# Patient Record
Sex: Male | Born: 1980
Health system: Southern US, Community
[De-identification: ages and names within clinical notes are randomized; demographics above are authoritative.]

## PROBLEM LIST (undated history)

## (undated) DIAGNOSIS — F32A Depression, unspecified: Secondary | ICD-10-CM

## (undated) DIAGNOSIS — M792 Neuralgia and neuritis, unspecified: Secondary | ICD-10-CM

## (undated) DIAGNOSIS — F319 Bipolar disorder, unspecified: Secondary | ICD-10-CM

## (undated) DIAGNOSIS — M1990 Unspecified osteoarthritis, unspecified site: Secondary | ICD-10-CM

## (undated) DIAGNOSIS — Z8719 Personal history of other diseases of the digestive system: Secondary | ICD-10-CM

## (undated) DIAGNOSIS — I10 Essential (primary) hypertension: Secondary | ICD-10-CM

## (undated) DIAGNOSIS — S8780XA Crushing injury of unspecified lower leg, initial encounter: Secondary | ICD-10-CM

## (undated) DIAGNOSIS — G43909 Migraine, unspecified, not intractable, without status migrainosus: Secondary | ICD-10-CM

## (undated) DIAGNOSIS — G9050 Complex regional pain syndrome I, unspecified: Secondary | ICD-10-CM

## (undated) DIAGNOSIS — J40 Bronchitis, not specified as acute or chronic: Secondary | ICD-10-CM

## (undated) DIAGNOSIS — K219 Gastro-esophageal reflux disease without esophagitis: Secondary | ICD-10-CM

## (undated) DIAGNOSIS — F419 Anxiety disorder, unspecified: Secondary | ICD-10-CM

## (undated) DIAGNOSIS — T7840XA Allergy, unspecified, initial encounter: Secondary | ICD-10-CM

## (undated) DIAGNOSIS — K259 Gastric ulcer, unspecified as acute or chronic, without hemorrhage or perforation: Secondary | ICD-10-CM

## (undated) DIAGNOSIS — F329 Major depressive disorder, single episode, unspecified: Secondary | ICD-10-CM

## (undated) HISTORY — DX: Allergy, unspecified, initial encounter: T78.40XA

## (undated) HISTORY — DX: Gastro-esophageal reflux disease without esophagitis: K21.9

## (undated) HISTORY — DX: Depression, unspecified: F32.A

## (undated) HISTORY — DX: Essential (primary) hypertension: I10

## (undated) HISTORY — DX: Gastric ulcer, unspecified as acute or chronic, without hemorrhage or perforation: K25.9

## (undated) HISTORY — DX: Migraine, unspecified, not intractable, without status migrainosus: G43.909

## (undated) HISTORY — DX: Anxiety disorder, unspecified: F41.9

## (undated) HISTORY — DX: Bipolar disorder, unspecified: F31.9

## (undated) HISTORY — DX: Bronchitis, not specified as acute or chronic: J40

## (undated) HISTORY — PX: HAND SURGERY: SHX662

## (undated) HISTORY — PX: MOUTH SURGERY: SHX715

## (undated) HISTORY — PX: COLONOSCOPY: SHX174

## (undated) HISTORY — DX: Major depressive disorder, single episode, unspecified: F32.9

---

## 2001-09-17 ENCOUNTER — Emergency Department (HOSPITAL_COMMUNITY): Admission: EM | Admit: 2001-09-17 | Discharge: 2001-09-17 | Payer: Self-pay

## 2002-02-06 ENCOUNTER — Emergency Department (HOSPITAL_COMMUNITY): Admission: EM | Admit: 2002-02-06 | Discharge: 2002-02-06 | Payer: Self-pay | Admitting: Emergency Medicine

## 2002-03-04 ENCOUNTER — Emergency Department (HOSPITAL_COMMUNITY): Admission: EM | Admit: 2002-03-04 | Discharge: 2002-03-05 | Payer: Self-pay | Admitting: Emergency Medicine

## 2002-03-30 ENCOUNTER — Emergency Department (HOSPITAL_COMMUNITY): Admission: EM | Admit: 2002-03-30 | Discharge: 2002-03-30 | Payer: Self-pay | Admitting: Emergency Medicine

## 2002-11-25 ENCOUNTER — Emergency Department (HOSPITAL_COMMUNITY): Admission: EM | Admit: 2002-11-25 | Discharge: 2002-11-25 | Payer: Self-pay

## 2003-02-07 ENCOUNTER — Encounter: Admission: RE | Admit: 2003-02-07 | Discharge: 2003-02-07 | Payer: Self-pay | Admitting: Family Medicine

## 2003-10-18 ENCOUNTER — Ambulatory Visit (HOSPITAL_COMMUNITY): Admission: RE | Admit: 2003-10-18 | Discharge: 2003-10-18 | Payer: Self-pay | Admitting: *Deleted

## 2003-10-18 ENCOUNTER — Encounter (INDEPENDENT_AMBULATORY_CARE_PROVIDER_SITE_OTHER): Payer: Self-pay | Admitting: *Deleted

## 2003-11-22 ENCOUNTER — Emergency Department (HOSPITAL_COMMUNITY): Admission: EM | Admit: 2003-11-22 | Discharge: 2003-11-23 | Payer: Self-pay | Admitting: Emergency Medicine
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
## 2004-08-12 ENCOUNTER — Emergency Department: Payer: Self-pay | Admitting: Emergency Medicine

## 2005-02-08 ENCOUNTER — Emergency Department: Payer: Self-pay | Admitting: Emergency Medicine

## 2005-02-19 ENCOUNTER — Emergency Department: Payer: Self-pay | Admitting: Emergency Medicine

## 2005-02-19 IMAGING — CR DG LUMBAR SPINE AP/LAT/OBLIQUES W/ FLEX AND EXT
1 series · 5 of 5 positions shown · non-contrast
Comparison: none

REASON FOR EXAM: Pain
COMMENTS:

PROCEDURE:     DXR - DXR LUMBAR SPINE WITH OBLIQUES  - [DATE]  [DATE]
RESULT:     Multiple views reveal normal alignment and curvature. Vertebral
body heights and intervertebral disc spaces are intact. No spondylolysis
and/or spondylolisthesis is identified.

[Series 1: view not recorded · 0.17mm/px · 5 of 5 slices shown]
[im 1/5]
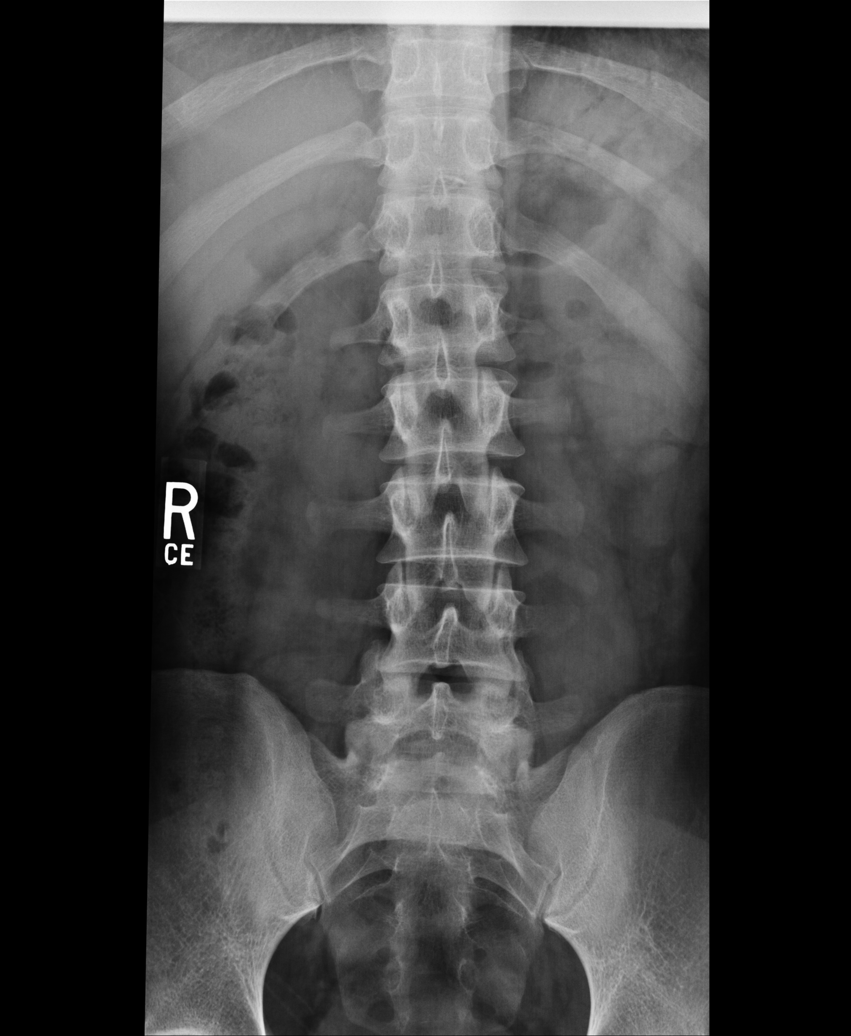
[im 2/5]
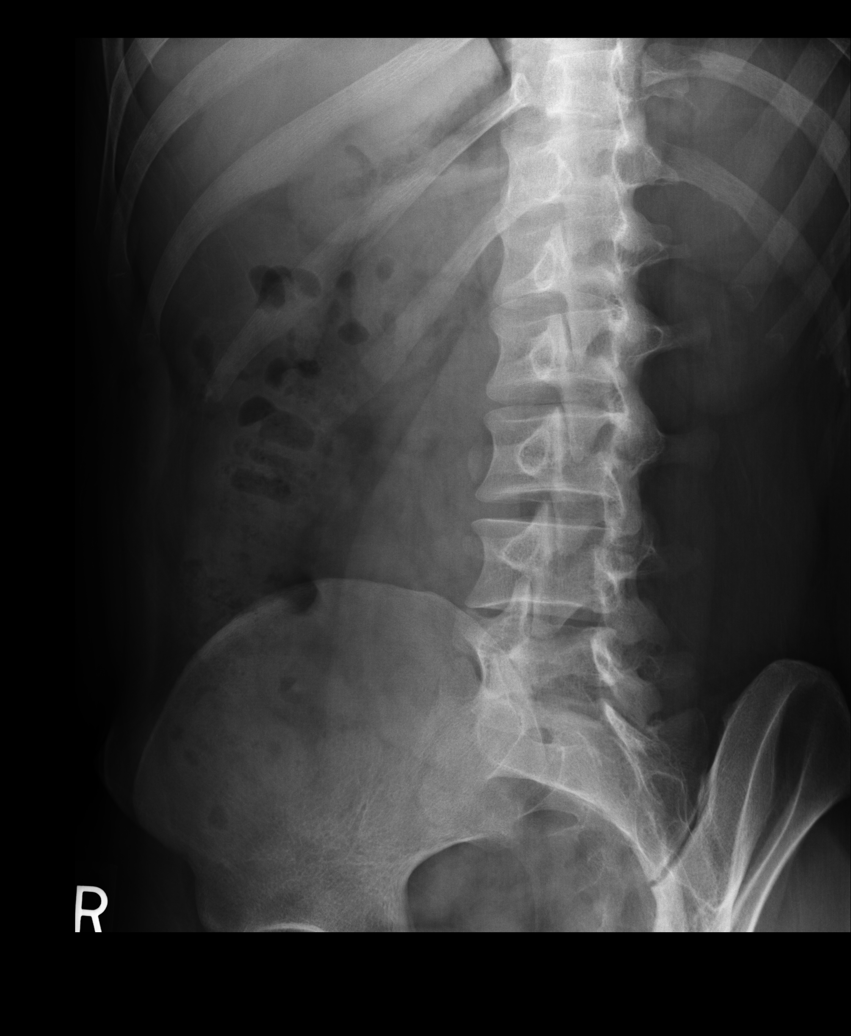
[im 3/5]
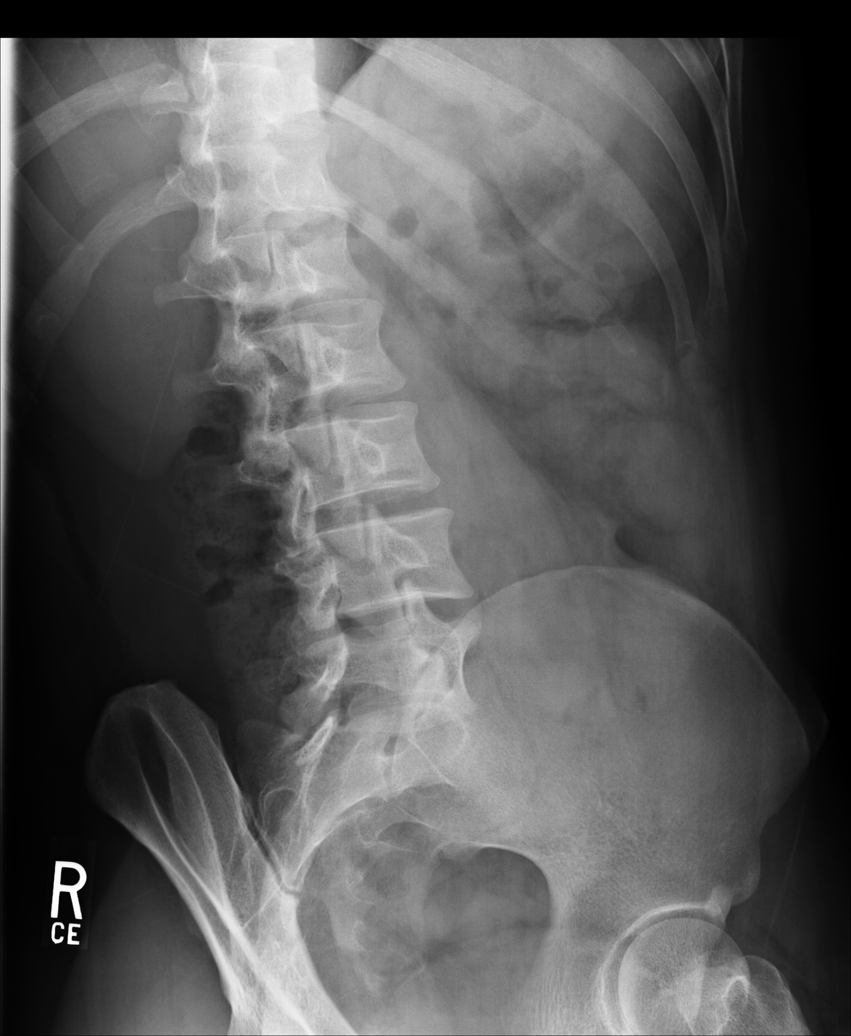
[im 4/5]
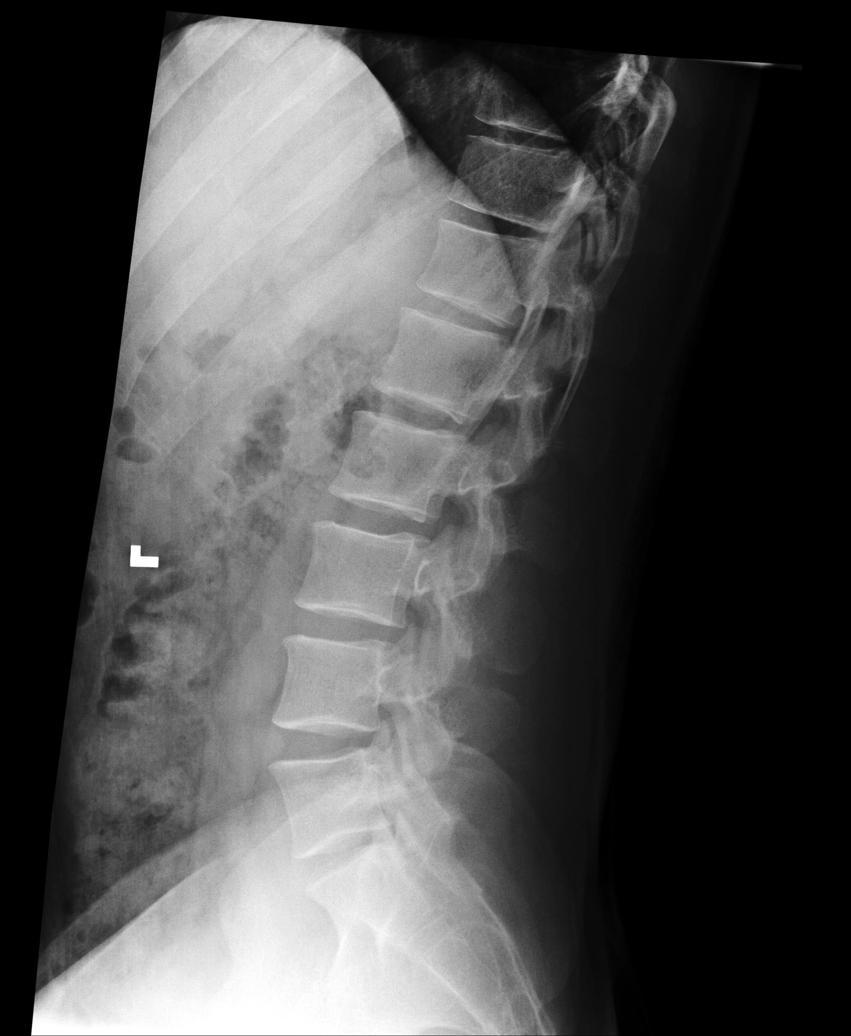
[im 5/5]
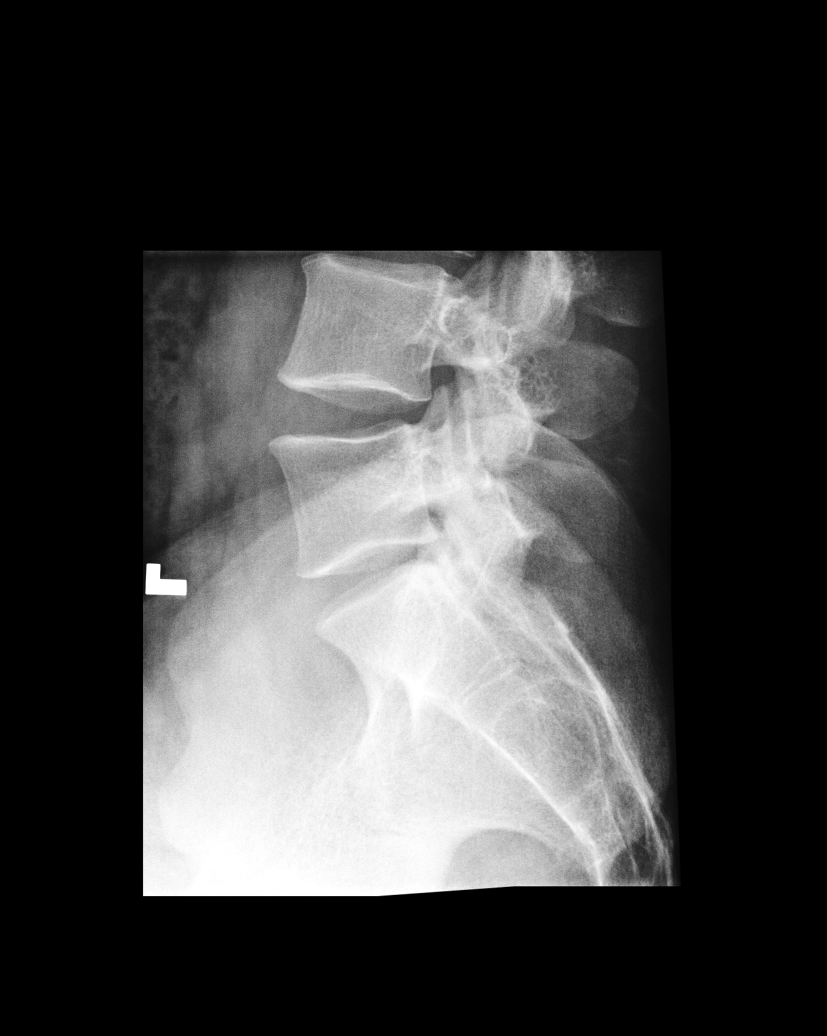

[5 of 5 positions shown; findings below may reference images not displayed]

IMPRESSION: No significant bony abnormality is noted of the lumbar
spine. If the patient has continued radiculopathy, lumbar spine MRI could be
performed to exclude a herniated disc.

## 2005-04-19 ENCOUNTER — Emergency Department (HOSPITAL_COMMUNITY): Admission: EM | Admit: 2005-04-19 | Discharge: 2005-04-19 | Payer: Self-pay | Admitting: Emergency Medicine

## 2005-04-19 IMAGING — CR DG FINGER LITTLE 2+V*R*
3 series · 3 of 3 positions shown · non-contrast
Comparison: None.

CLINICAL DATA: Dog bite.
 RIGHT FIFTH FINGER - 3 VIEW:

[view not recorded (1 of 3)]
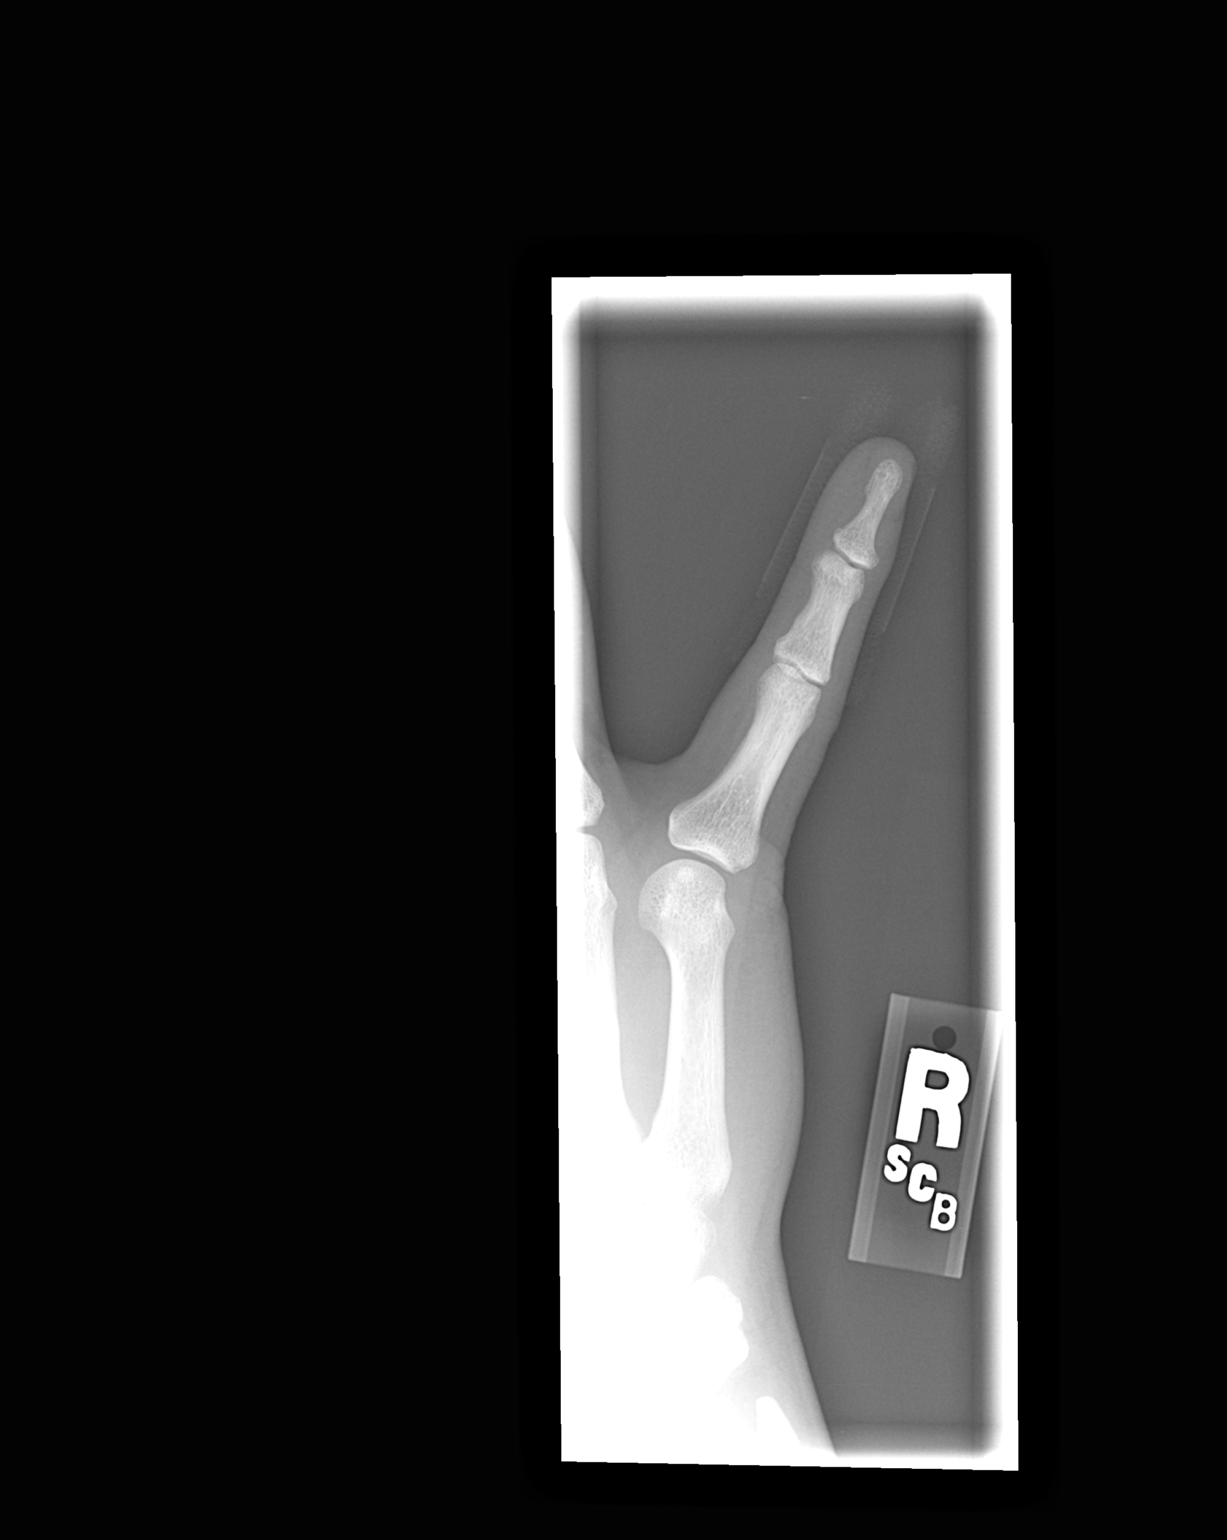

[view not recorded (2 of 3)]
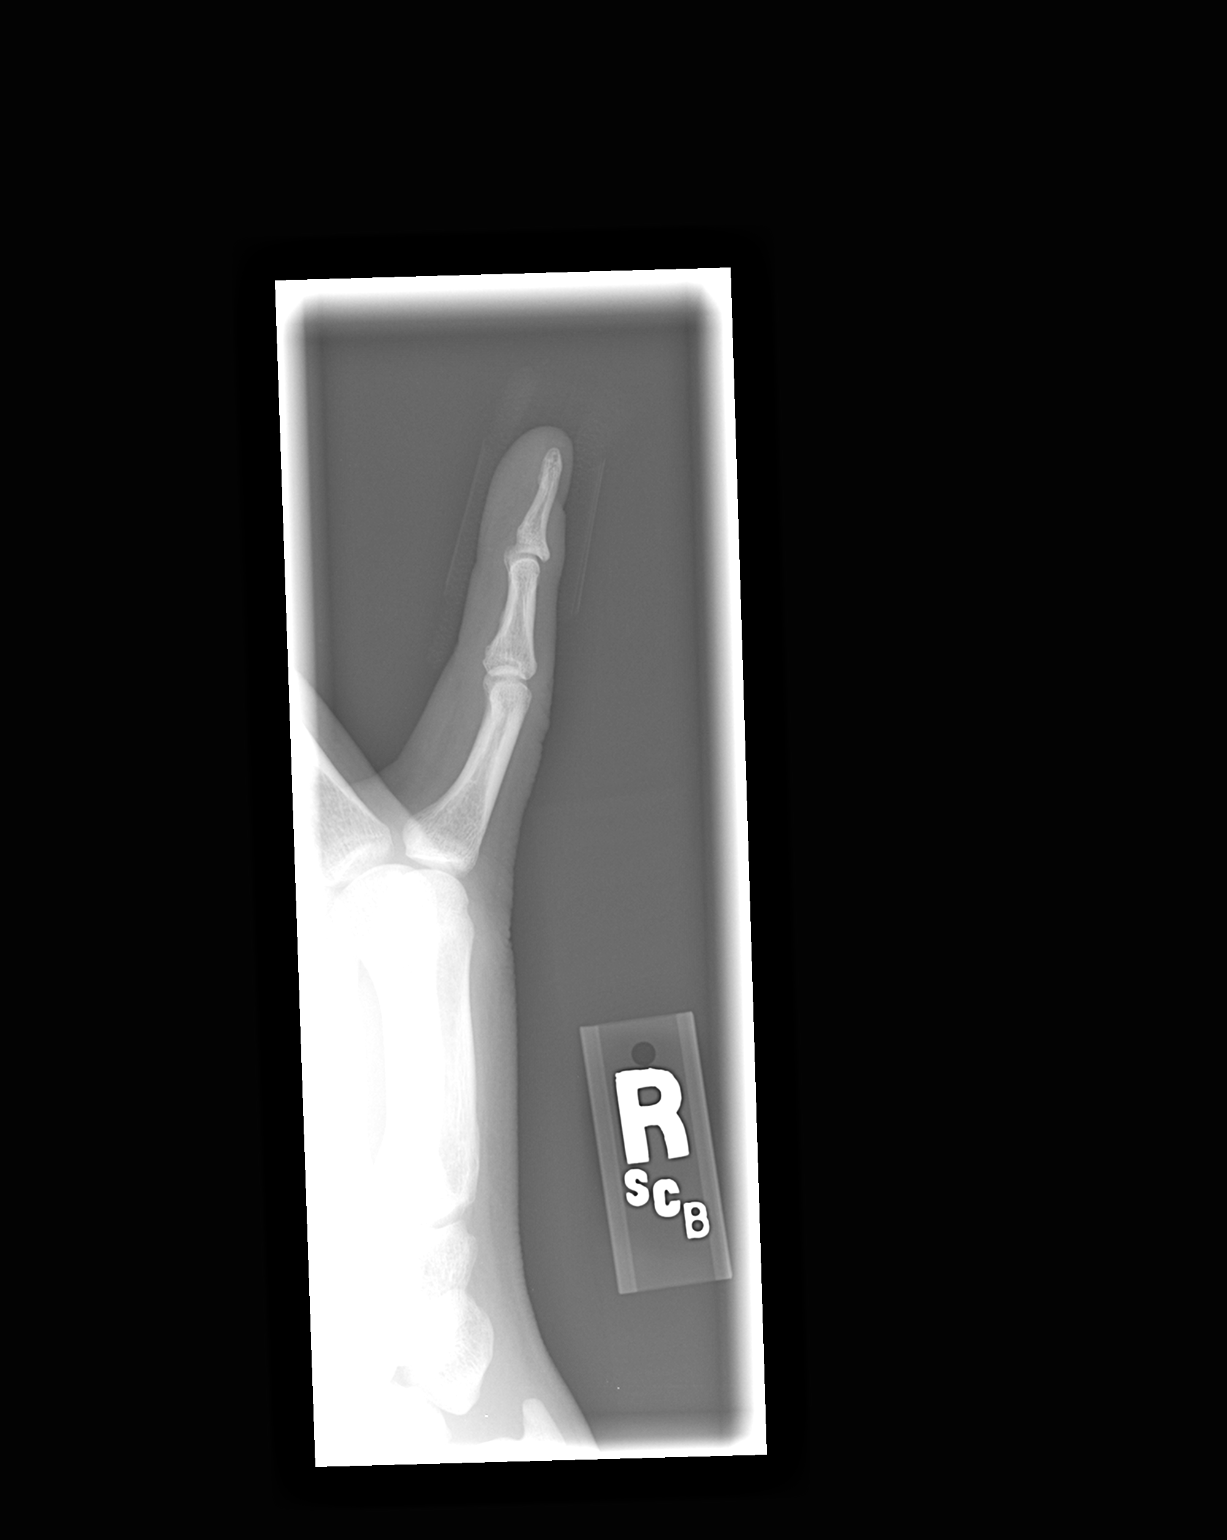

[view not recorded (3 of 3)]
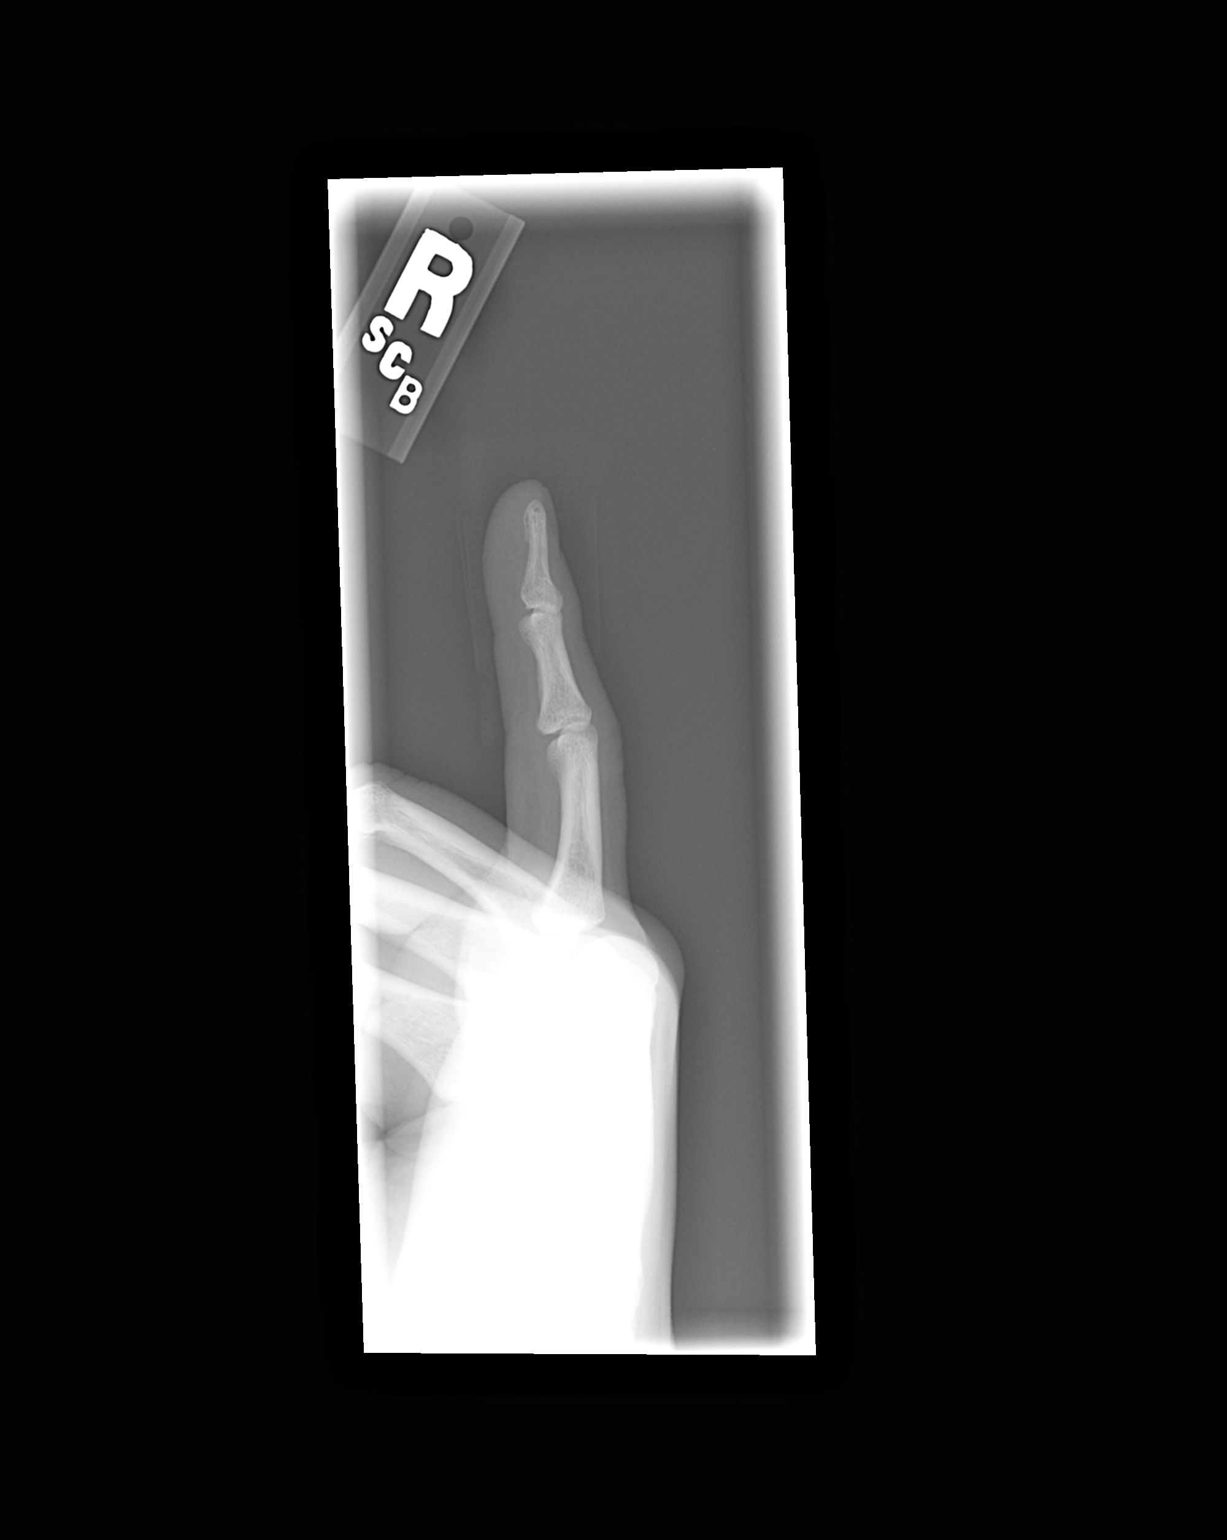

[3 of 3 positions shown; findings below may reference images not displayed]

FINDINGS: No acute osseous abnormalities are noted.  Specifically no fractures or dislocations.
IMPRESSION: No acute osseous finding.

## 2005-06-15 ENCOUNTER — Emergency Department: Payer: Self-pay | Admitting: Emergency Medicine

## 2005-08-02 ENCOUNTER — Emergency Department: Payer: Self-pay | Admitting: General Practice
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
## 2006-01-13 ENCOUNTER — Emergency Department: Payer: Self-pay | Admitting: Emergency Medicine
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
## 2006-02-27 ENCOUNTER — Ambulatory Visit: Payer: Self-pay | Admitting: Psychiatry

## 2006-02-27 ENCOUNTER — Inpatient Hospital Stay (HOSPITAL_COMMUNITY): Admission: AD | Admit: 2006-02-27 | Discharge: 2006-03-03 | Payer: Self-pay | Admitting: Psychiatry

## 2006-04-15 ENCOUNTER — Emergency Department (HOSPITAL_COMMUNITY): Admission: EM | Admit: 2006-04-15 | Discharge: 2006-04-16 | Payer: Self-pay | Admitting: Emergency Medicine

## 2006-04-16 ENCOUNTER — Emergency Department (HOSPITAL_COMMUNITY): Admission: EM | Admit: 2006-04-16 | Discharge: 2006-04-17 | Payer: Self-pay | Admitting: Emergency Medicine

## 2006-05-21 ENCOUNTER — Emergency Department: Payer: Self-pay | Admitting: Emergency Medicine
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
## 2006-11-12 ENCOUNTER — Emergency Department (HOSPITAL_COMMUNITY): Admission: EM | Admit: 2006-11-12 | Discharge: 2006-11-12 | Payer: Self-pay | Admitting: Emergency Medicine
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
## 2006-12-08 ENCOUNTER — Emergency Department (HOSPITAL_COMMUNITY): Admission: EM | Admit: 2006-12-08 | Discharge: 2006-12-08 | Payer: Self-pay | Admitting: Emergency Medicine

## 2007-05-12 ENCOUNTER — Emergency Department (HOSPITAL_COMMUNITY): Admission: EM | Admit: 2007-05-12 | Discharge: 2007-05-12 | Payer: Self-pay | Admitting: Emergency Medicine

## 2007-06-04 ENCOUNTER — Emergency Department (HOSPITAL_COMMUNITY): Admission: EM | Admit: 2007-06-04 | Discharge: 2007-06-04 | Payer: Self-pay | Admitting: Emergency Medicine

## 2007-07-05 ENCOUNTER — Emergency Department (HOSPITAL_COMMUNITY): Admission: EM | Admit: 2007-07-05 | Discharge: 2007-07-05 | Payer: Self-pay | Admitting: Emergency Medicine

## 2008-01-29 ENCOUNTER — Emergency Department (HOSPITAL_COMMUNITY): Admission: EM | Admit: 2008-01-29 | Discharge: 2008-01-29 | Payer: Self-pay | Admitting: Emergency Medicine
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
## 2008-08-22 ENCOUNTER — Emergency Department (HOSPITAL_COMMUNITY): Admission: EM | Admit: 2008-08-22 | Discharge: 2008-08-23 | Payer: Self-pay | Admitting: Emergency Medicine

## 2008-08-31 ENCOUNTER — Emergency Department (HOSPITAL_COMMUNITY): Admission: EM | Admit: 2008-08-31 | Discharge: 2008-08-31 | Payer: Self-pay | Admitting: Emergency Medicine

## 2008-12-03 ENCOUNTER — Emergency Department (HOSPITAL_COMMUNITY): Admission: EM | Admit: 2008-12-03 | Discharge: 2008-12-03 | Payer: Self-pay | Admitting: Emergency Medicine
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
## 2009-05-10 ENCOUNTER — Emergency Department (HOSPITAL_COMMUNITY): Admission: EM | Admit: 2009-05-10 | Discharge: 2009-05-10 | Payer: Self-pay | Admitting: Family Medicine

## 2010-01-23 ENCOUNTER — Emergency Department (HOSPITAL_COMMUNITY): Admission: EM | Admit: 2010-01-23 | Discharge: 2010-01-23 | Payer: Self-pay | Admitting: Family Medicine

## 2010-06-16 ENCOUNTER — Inpatient Hospital Stay (INDEPENDENT_AMBULATORY_CARE_PROVIDER_SITE_OTHER)
Admission: RE | Admit: 2010-06-16 | Discharge: 2010-06-16 | Disposition: A | Discharge status: Home / Self Care | Payer: BC Managed Care – PPO | Source: Ambulatory Visit | Attending: Family Medicine | Admitting: Family Medicine

## 2010-06-16 ENCOUNTER — Emergency Department (HOSPITAL_COMMUNITY): Payer: BC Managed Care – PPO

## 2010-06-16 ENCOUNTER — Emergency Department (HOSPITAL_COMMUNITY)
Admission: EM | Admit: 2010-06-16 | Discharge: 2010-06-17 | Disposition: A | Discharge status: Home / Self Care | Payer: BC Managed Care – PPO | Attending: Emergency Medicine | Admitting: Emergency Medicine

## 2010-06-16 DIAGNOSIS — R109 Unspecified abdominal pain: Secondary | ICD-10-CM | POA: Insufficient documentation

## 2010-06-16 DIAGNOSIS — R110 Nausea: Secondary | ICD-10-CM | POA: Insufficient documentation

## 2010-06-16 DIAGNOSIS — R1084 Generalized abdominal pain: Secondary | ICD-10-CM

## 2010-06-16 DIAGNOSIS — R51 Headache: Secondary | ICD-10-CM | POA: Insufficient documentation

## 2010-06-16 DIAGNOSIS — R10812 Left upper quadrant abdominal tenderness: Secondary | ICD-10-CM | POA: Insufficient documentation

## 2010-06-16 DIAGNOSIS — I10 Essential (primary) hypertension: Secondary | ICD-10-CM | POA: Insufficient documentation

## 2010-06-16 LAB — DIFFERENTIAL
Basophils Absolute: 0 10*3/uL (ref 0.0–0.1)
Basophils Relative: 0 % (ref 0–1)
Eosinophils Absolute: 0.2 10*3/uL (ref 0.0–0.7)
Eosinophils Relative: 3 % (ref 0–5)
Lymphocytes Relative: 23 % (ref 12–46)
Lymphs Abs: 1.6 10*3/uL (ref 0.7–4.0)
Monocytes Absolute: 0.4 10*3/uL (ref 0.1–1.0)
Monocytes Relative: 5 % (ref 3–12)
Neutro Abs: 4.8 10*3/uL (ref 1.7–7.7)
Neutrophils Relative %: 69 % (ref 43–77)

## 2010-06-16 LAB — POCT URINALYSIS DIP (DEVICE)
Bilirubin Urine: NEGATIVE
Glucose, UA: NEGATIVE mg/dL
Hgb urine dipstick: NEGATIVE
Ketones, ur: NEGATIVE mg/dL
Nitrite: POSITIVE — AB
Protein, ur: NEGATIVE mg/dL
Specific Gravity, Urine: 1.01 (ref 1.005–1.030)
Urobilinogen, UA: 0.2 mg/dL (ref 0.0–1.0)
pH: 7 (ref 5.0–8.0)

## 2010-06-16 LAB — COMPREHENSIVE METABOLIC PANEL
ALT: 25 U/L (ref 0–53)
AST: 23 U/L (ref 0–37)
Albumin: 4.7 g/dL (ref 3.5–5.2)
Alkaline Phosphatase: 66 U/L (ref 39–117)
BUN: 14 mg/dL (ref 6–23)
CO2: 29 mEq/L (ref 19–32)
Calcium: 9.7 mg/dL (ref 8.4–10.5)
Chloride: 102 mEq/L (ref 96–112)
Creatinine, Ser: 0.88 mg/dL (ref 0.4–1.5)
GFR calc Af Amer: >60 mL/min (ref >60)
GFR calc non Af Amer: >60 mL/min (ref >60)
Glucose, Bld: 98 mg/dL (ref 70–99)
Potassium: 4.2 mEq/L (ref 3.5–5.1)
Sodium: 143 mEq/L (ref 135–145)
Total Bilirubin: 0.4 mg/dL (ref 0.3–1.2)
Total Protein: 7.5 g/dL (ref 6.0–8.3)

## 2010-06-16 LAB — CBC
HCT: 46.5 % (ref 39.0–52.0)
Hemoglobin: 16.1 g/dL (ref 13.0–17.0)
MCH: 32.1 pg (ref 26.0–34.0)
MCHC: 34.6 g/dL (ref 30.0–36.0)
MCV: 92.6 fL (ref 78.0–100.0)
Platelets: 221 10*3/uL (ref 150–400)
RBC: 5.02 MIL/uL (ref 4.22–5.81)
RDW: 12.9 % (ref 11.5–15.5)
WBC: 6.9 10*3/uL (ref 4.0–10.5)

## 2010-06-16 LAB — URINALYSIS, ROUTINE W REFLEX MICROSCOPIC
Bilirubin Urine: NEGATIVE
Glucose, UA: NEGATIVE mg/dL
Hgb urine dipstick: NEGATIVE
Ketones, ur: NEGATIVE mg/dL
Nitrite: NEGATIVE
Protein, ur: NEGATIVE mg/dL
Specific Gravity, Urine: 1.004 — ABNORMAL LOW (ref 1.005–1.030)
Urobilinogen, UA: 0.2 mg/dL (ref 0.0–1.0)
pH: 7.5 (ref 5.0–8.0)

## 2010-06-16 LAB — LIPASE, BLOOD: Lipase: 26 U/L (ref 11–59)

## 2010-06-16 IMAGING — CT CT ABD-PELV W/ CM
2 of 4 series · 13 of 32 positions shown, 18 images · IV contrast (water/omni  & 100ml omni 300)
Comparison: None.

CLINICAL DATA: Right side abdominal pain.

CT ABDOMEN AND PELVIS WITH CONTRAST
TECHNIQUE: Multidetector CT imaging of the abdomen and pelvis was
performed following the standard protocol during bolus
administration of intravenous contrast.
Contrast: 100 ml [O0]

[Series 2: routine abdomen · axial · 0.85mm/px · z∈[-465,-135]mm · 5 of 100 slices shown, 10 images]
[im 17/100  soft-tissue]
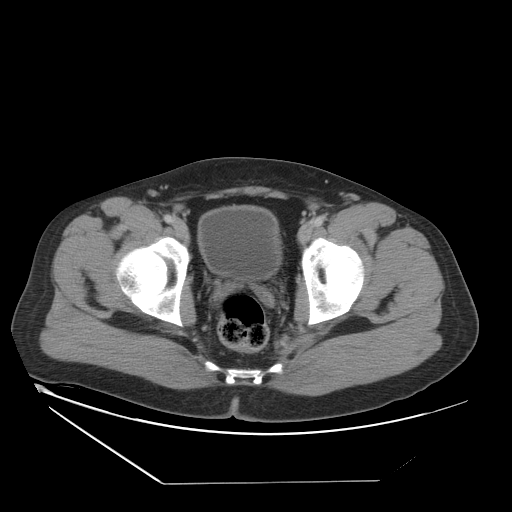
[im 17/100  bone]
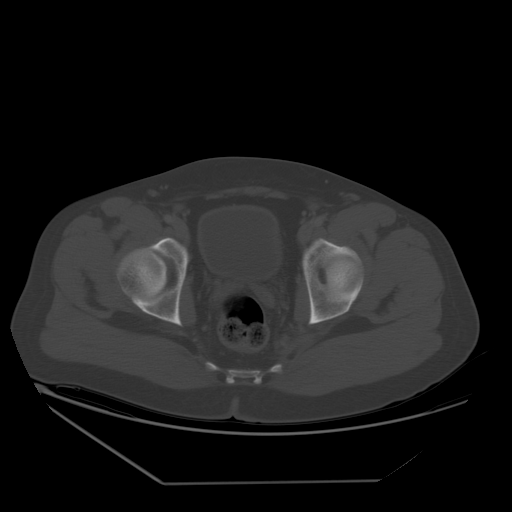
[im 34/100  soft-tissue]
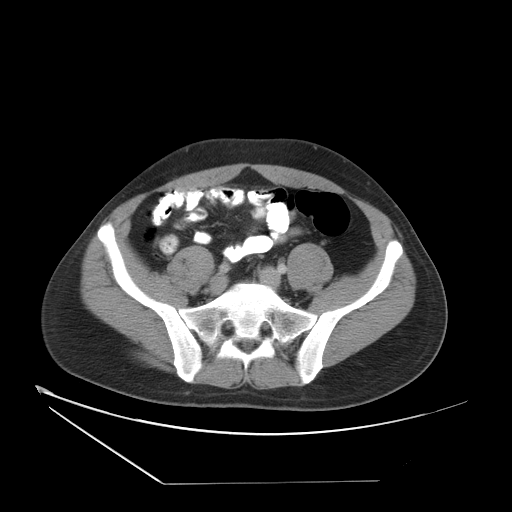
[im 34/100  lung]
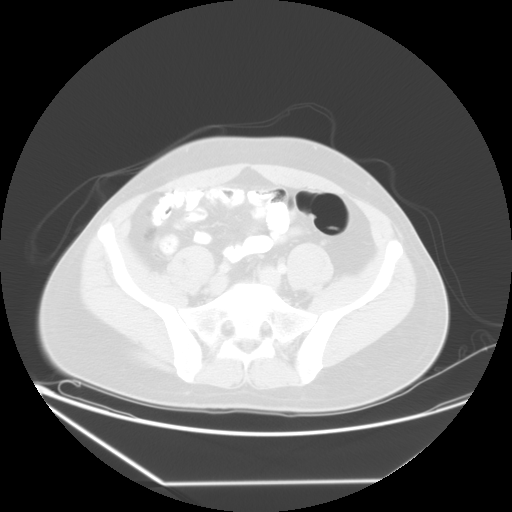
[im 50/100  soft-tissue]
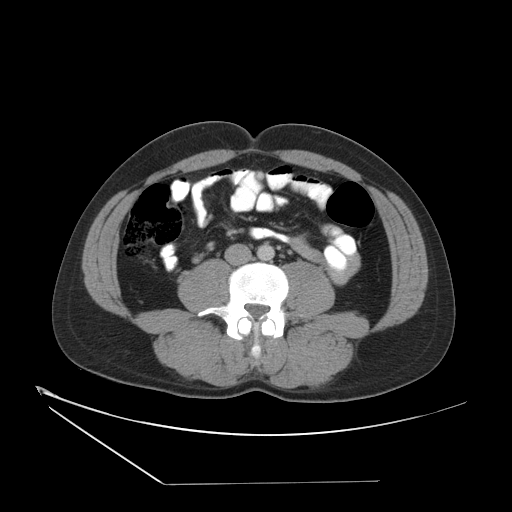
[im 50/100  lung]
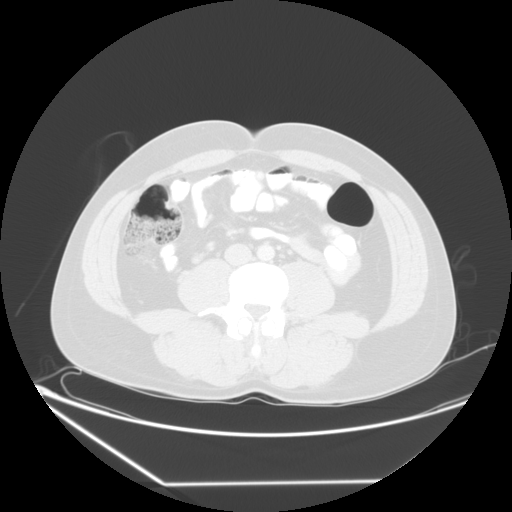
[im 67/100  soft-tissue]
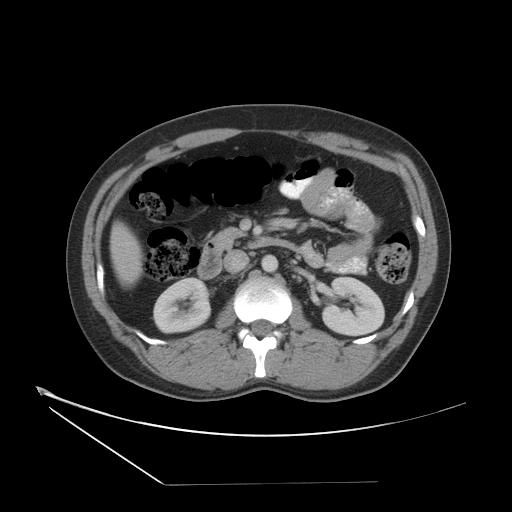
[im 67/100  lung]
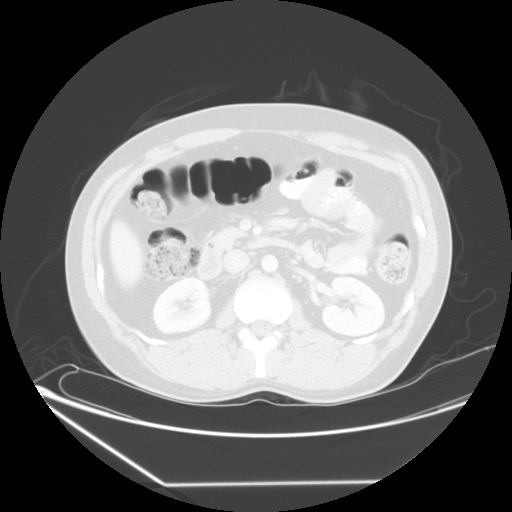
[im 83/100  soft-tissue]
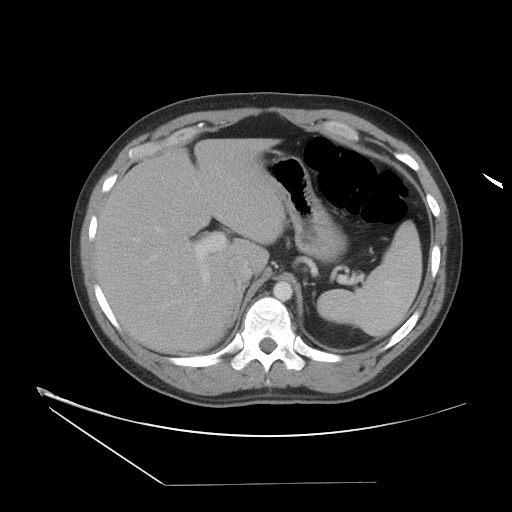
[im 83/100  lung]
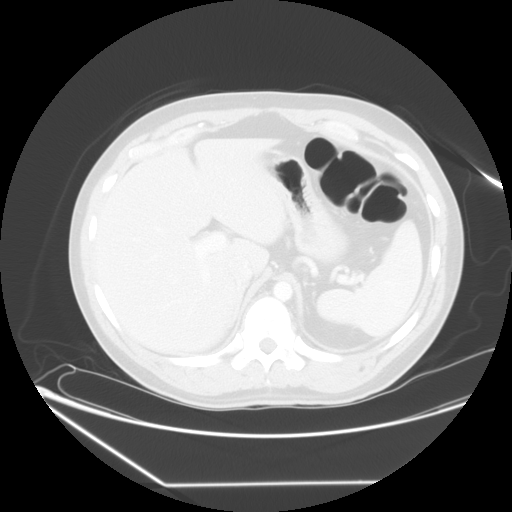

[Series 401: reformatted · sagittal · 0.99mm/px · 8 of 182 slices shown]
[im 16/182  soft-tissue]
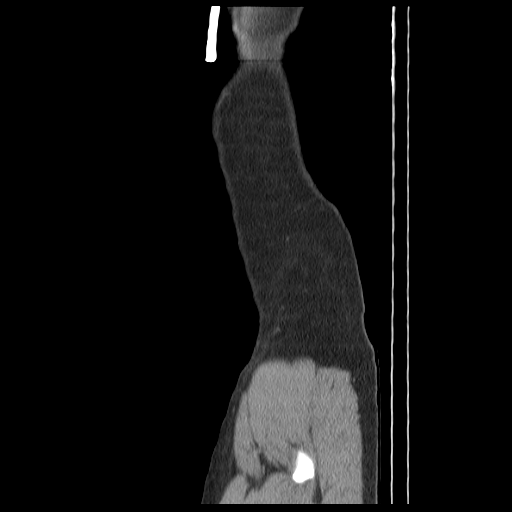
[im 46/182  soft-tissue]
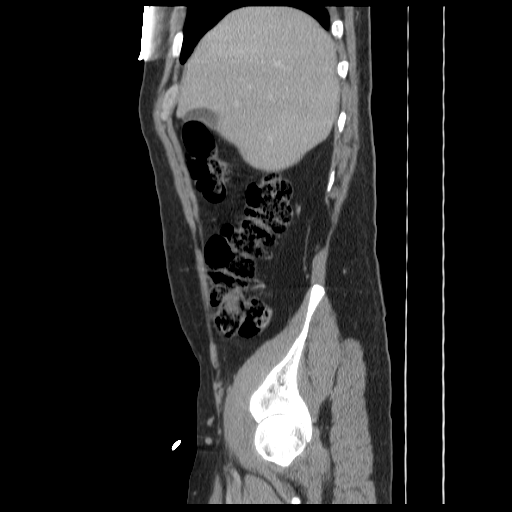
[im 61/182  soft-tissue]
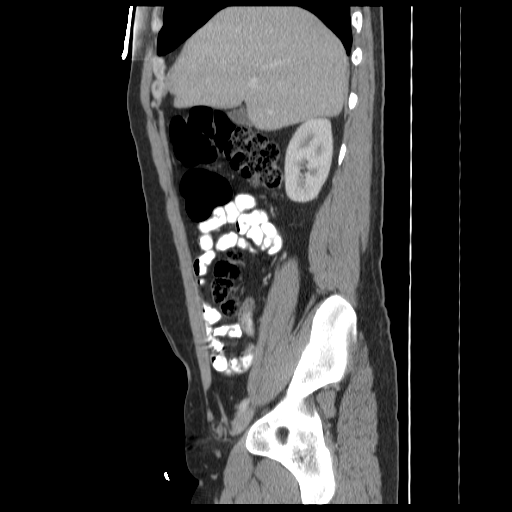
[im 76/182  soft-tissue]
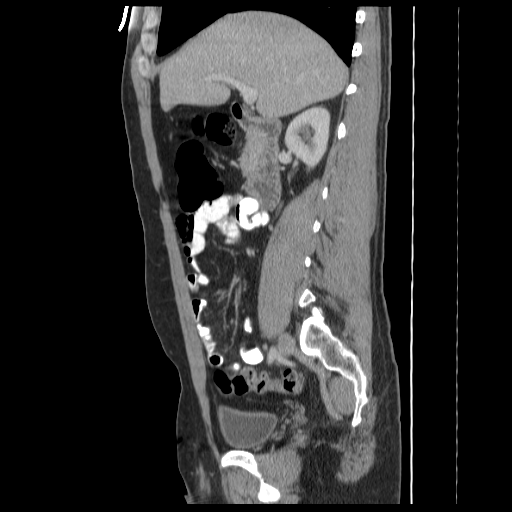
[im 106/182  soft-tissue]
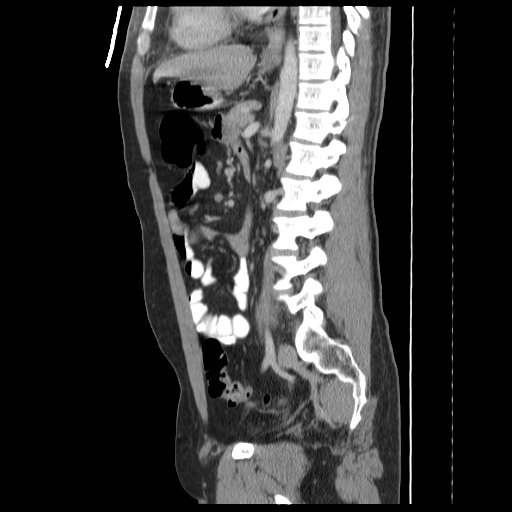
[im 121/182  soft-tissue]
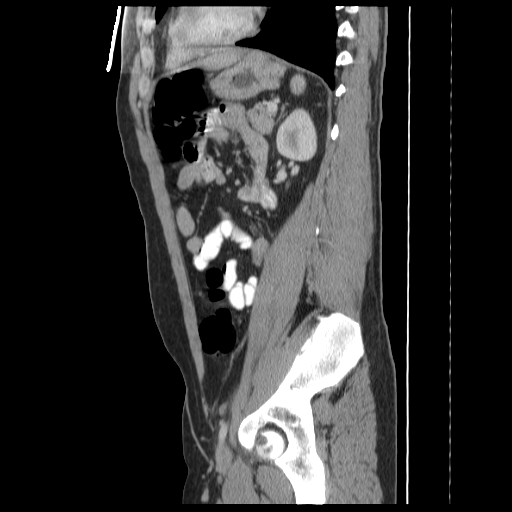
[im 136/182  soft-tissue]
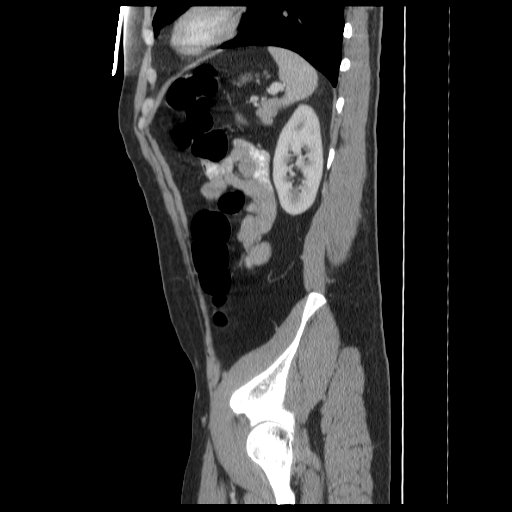
[im 166/182  soft-tissue]
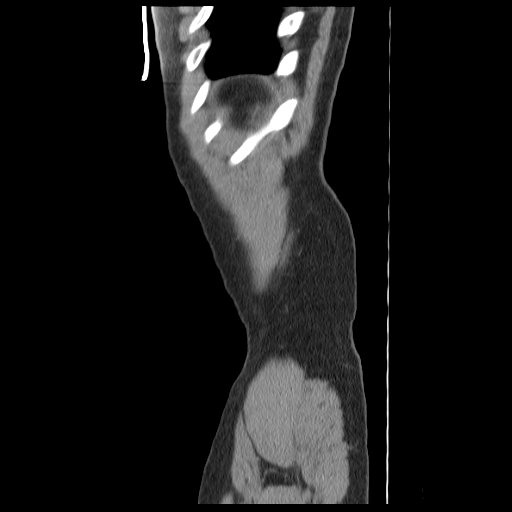

[13 of 32 positions shown; findings below may reference images not displayed]

FINDINGS: No focal abnormalities seen in the liver or spleen.  The
stomach, duodenum, pancreas, gallbladder, adrenal glands, and
kidneys have normal imaging features.  Retroaortic left renal vein
noted.

No abdominal aortic aneurysm.  No free fluid in the abdomen.  No
abdominal lymphadenopathy.

Imaging through the pelvis shows no free intraperitoneal fluid.  No
pelvic sidewall lymphadenopathy.  Bladder and prostate are normal
in appearance.  Scattered diverticular change seen in the sigmoid
colon without diverticulitis.  The terminal ileum is normal.  The
appendix is normal.

Bone windows reveal no worrisome lytic or sclerotic osseous lesions
IMPRESSION: No acute findings in the abdomen or pelvis.  Specifically, no
evidence to explain the patient's history of lower right abdominal
pain.

## 2010-06-16 MED ORDER — IOHEXOL 300 MG/ML  SOLN
100.0000 mL | Freq: Once | INTRAMUSCULAR | Status: AC | PRN
  Administered 2010-06-16: 100 mL via INTRAVENOUS

## 2010-06-18 ENCOUNTER — Emergency Department (HOSPITAL_COMMUNITY)
Admission: EM | Admit: 2010-06-18 | Discharge: 2010-06-18 | Disposition: A | Discharge status: Home / Self Care | Payer: BC Managed Care – PPO | Attending: Emergency Medicine | Admitting: Emergency Medicine

## 2010-06-18 DIAGNOSIS — K5900 Constipation, unspecified: Secondary | ICD-10-CM | POA: Insufficient documentation

## 2010-06-18 DIAGNOSIS — G8929 Other chronic pain: Secondary | ICD-10-CM | POA: Insufficient documentation

## 2010-06-18 DIAGNOSIS — R1033 Periumbilical pain: Secondary | ICD-10-CM | POA: Insufficient documentation

## 2010-06-18 DIAGNOSIS — R1915 Other abnormal bowel sounds: Secondary | ICD-10-CM | POA: Insufficient documentation

## 2010-06-18 DIAGNOSIS — I10 Essential (primary) hypertension: Secondary | ICD-10-CM | POA: Insufficient documentation

## 2010-06-18 LAB — URINALYSIS, ROUTINE W REFLEX MICROSCOPIC
Bilirubin Urine: NEGATIVE
Glucose, UA: NEGATIVE mg/dL
Hgb urine dipstick: NEGATIVE
Ketones, ur: NEGATIVE mg/dL
Nitrite: NEGATIVE
Protein, ur: NEGATIVE mg/dL
Specific Gravity, Urine: 1.01 (ref 1.005–1.030)
Urobilinogen, UA: 0.2 mg/dL (ref 0.0–1.0)
pH: 6 (ref 5.0–8.0)

## 2010-06-18 LAB — DIFFERENTIAL
Basophils Absolute: 0 10*3/uL (ref 0.0–0.1)
Basophils Relative: 0 % (ref 0–1)
Eosinophils Absolute: 0.2 10*3/uL (ref 0.0–0.7)
Eosinophils Relative: 2 % (ref 0–5)
Lymphocytes Relative: 29 % (ref 12–46)
Lymphs Abs: 2 10*3/uL (ref 0.7–4.0)
Monocytes Absolute: 0.4 10*3/uL (ref 0.1–1.0)
Monocytes Relative: 6 % (ref 3–12)
Neutro Abs: 4.3 10*3/uL (ref 1.7–7.7)
Neutrophils Relative %: 63 % (ref 43–77)

## 2010-06-18 LAB — BASIC METABOLIC PANEL
BUN: 12 mg/dL (ref 6–23)
CO2: 30 mEq/L (ref 19–32)
Calcium: 9.6 mg/dL (ref 8.4–10.5)
Chloride: 99 mEq/L (ref 96–112)
Creatinine, Ser: 0.96 mg/dL (ref 0.4–1.5)
GFR calc Af Amer: >60 mL/min (ref >60)
GFR calc non Af Amer: >60 mL/min (ref >60)
Glucose, Bld: 102 mg/dL — ABNORMAL HIGH (ref 70–99)
Potassium: 3.5 mEq/L (ref 3.5–5.1)
Sodium: 139 mEq/L (ref 135–145)

## 2010-06-18 LAB — LIPASE, BLOOD: Lipase: 21 U/L (ref 11–59)

## 2010-06-18 LAB — CBC
HCT: 46 % (ref 39.0–52.0)
Hemoglobin: 16.1 g/dL (ref 13.0–17.0)
MCH: 32.4 pg (ref 26.0–34.0)
MCHC: 35 g/dL (ref 30.0–36.0)
MCV: 92.6 fL (ref 78.0–100.0)
Platelets: 212 10*3/uL (ref 150–400)
RBC: 4.97 MIL/uL (ref 4.22–5.81)
RDW: 12.6 % (ref 11.5–15.5)
WBC: 6.9 10*3/uL (ref 4.0–10.5)

## 2010-08-09 NOTE — Discharge Summary (Signed)
NAME:  Shane Cole, Shane Cole NO.:  [id number]   MEDICAL RECORD NO.:  [id number]          PATIENT TYPE:  IPS   LOCATION:  0302                          FACILITY:  [facility]   PHYSICIAN:  Geoffery Lyons, M.D.      DATE OF BIRTH:  [birth year]   DATE OF ADMISSION:  02/27/2006  DATE OF DISCHARGE:  03/03/2006                               DISCHARGE SUMMARY   CHIEF COMPLAINT AND PRESENT ILLNESS:  This is the first admission to  [facility] for this 30 year old single white male  voluntarily admitted with history of depression and suicidal thoughts.  He planned to put a coil around his neck.  He has been under a great  deal of stress.  He broke up with his girlfriend and has abusing  opioids, using hydrocodone for the past 2 years, up to 3 or more daily.  Last use was December 6.  Reported alcohol use.  He said he had some  valium from a friend to help with his anxiety.   PAST PSYCHIATRIC HISTORY:  First time at [facility], sponsored by  [employer].  He was detoxed in a facility in June.  He had tried  multiple medications include Cymbalta and Seroquel.   ALCOHOL/DRUG HISTORY:  As already stated persistence use of opioids, not  alcohol.   MEDICAL HISTORY:  Recurrent pain in left foot from a crush injury with  RSD.   MEDICATIONS:  Recent Valium and Ativan.   PHYSICAL EXAMINATION:  Performed and failed to show any acute findings.   LABORATORY DATA:  CBC within normal limits.  BMET within normal limits.  UDS positive for benzos and opioids.   MENTAL STATUS EXAM:  This is a fully alert, cooperative male, good eye  contact, casually dressed.  Speech was clear.  Normal rate, tempo and  production.  Seemed depressed and anxious. Having some withdrawal  symptoms.  Pleasant. Gets teary-eyed at times, talking about his  suicidal plan. Thought processes are logical, coherent and relevant.  No  evidence of delusions. No active suicidal or homicidal ideas, no  hallucinations.  Cognition well preserved.   Axis I:  1. Major depressive disorder, rule out substance abuse mood disorder.  2. Opioid dependent, admits to benzodiazepine abuse.  Axis II:  No diagnosis.  Axis III:  RSD left foot.  Axis IV: Moderate.  Axis V:  Upon admission 35, highest GAF in the last year 60.   HOSPITAL COURSE:  He was admitted.  He was started individual and group  psychotherapy with detox protocol with clonidine.  Placed on Cymbalta 30  mg per day.  Given some Librium and he was given Seroquel as needed.  He  endorsed that he got dependent on opioids, claimed he broke his foot at  work when a piece of granite fell on it.  He ran out of insurance, could  not afford to see a physician.  Started buying the opioids in the  street.  Got more dependent and tolerant.  More recently his fiance  broke up with him due to lies and his use.  She felt  that he was  cheating on her, although he denied.  Wanted to abstain as well followup  in the Pain Clinic.  He had put a cord around his neck to finish his  life.  She apparently read his inappropriate text message.  Had been on  amitriptyline in the past, poor response.  Tried Cymbalta, but stopped  after he got better.  He has been feeling overwhelmed and stressed out.  By December 8, he was having some tremor shakes, withdrawn and guarded  and somewhat tearful.  Anniversary of the relationship with the  girlfriend.  By December 9 there was some improvement.  Continued to  have a hard time thinking about the girlfriend.  He started improving.  He was open in group and in individual sessions.  His mood improved and  his affect became brighter.  By December 11, he was fully detoxed,  committed to abstinence.  Was endorsing no suicidal or homicidal  ideation, no delusions.  Was going to pursue  medications and continued  to work with the counselor.   DISCHARGE DIAGNOSES:  Axis I: Major depressive disorder, opioid  dependence,  benzodiazepine abuse.  Axis II:    No diagnosis.  Axis III:   RSD left foot.  Axis IV:    Moderate.  Axis V:     Upon discharge 55-60.   DISCHARGE MEDICATIONS:  1. Cymbalta 30 mg a day.  2. Ambien CI 12.5 mg per day.   FOLLOWUP:  Pain Clinic.      Geoffery Lyons, M.D.  Electronically Signed     IL/MEDQ  D:  03/25/2006  T:  03/26/2006  Job:  [id number]

## 2010-08-09 NOTE — H&P (Signed)
NAME:  Shane Cole, Shane Cole NO.:  [id number]   MEDICAL RECORD NO.:  [id number]          PATIENT TYPE:  IPS   LOCATION:  0302                          FACILITY:  [facility]   PHYSICIAN:  Geoffery Lyons, M.D.      DATE OF BIRTH:  06-03-1980   DATE OF ADMISSION:  02/27/2006  DATE OF DISCHARGE:                       PSYCHIATRIC ADMISSION ASSESSMENT   IDENTIFYING INFORMATION:  The patient is a 30 year old single white male  voluntarily admitted on February 27, 2006.   HISTORY OF PRESENT ILLNESS:  The patient presents with a history of  depression and suicidal thoughts.  Had a plan to put a cord around his  neck.  The patient states he has been under a great deal of stress.  Broke up with his girlfriend and current medical problems.  He also has  been abusing opiates, using hydrocodone, for the past two years, taking  up to 20 or more daily with his last use on February 26, 2006.  He  reports rare alcohol use and recently got some Valium from a friend to  help with his anxiety.   PAST PSYCHIATRIC HISTORY:  First admission to [facility].  He is currently sponsored at this time.  He was detoxed in a facility in  June.  He states he was on 13 medications including Cymbalta and  Seroquel.   SOCIAL HISTORY:  The patient is a 30 year old single white male.  No  children.  Lives with his father.  He works Probation officer.  He has  a court date pending on March 21, 2006 for a DUI which he states is  his first.  States it was a mistake and his charge will be dropped.   FAMILY HISTORY:  Depression on his father's side.  Aunt also with  depression.   ALCOHOL/DRUG HISTORY:  The patient smokes.  Alcohol as described above.   PRIMARY CARE PHYSICIAN:  Dr. Barrie Dunker.   MEDICAL PROBLEMS:  Chronic pain from left foot from a crush injury 2-1/2  years ago with RSD.   MEDICATIONS:  Recent Valium and Ativan.   ALLERGIES:  __________ (states he gets ringing in his ear),  PENICILLIN  (from childhood), AMOXICILLIN (reports a rash) and allergic to CODEINE.   PHYSICAL EXAMINATION:  The patient was fully assessed at [facility]  Emergency Department.  He is a young male missing his four upper teeth.  Feeling very achy and cold.  Temperature is 98.5, heart rate 97,  respirations 16, blood pressure 141/89.  He is 184 pounds.  He appears  well-nourished.  Appears in no acute physical distress.   LABORATORY DATA:  His CBC is within normal limits.  His BMET is within  normal limits.  Urine drug screen positive for benzodiazepines, positive  for opiates.  Alcohol level less than 5.   MENTAL STATUS EXAM:  He is fully alert, cooperative.  Good eye contact.  Casually dressed.  Speech is clear, normal rate and tone.  The patient  feels depressed and anxious, having some withdrawal symptoms.  The  patient is pleasant, gets teary-eyed at times when talking about his  suicide  plan.  Thought processes are coherent.  No evidence of  psychosis.  Cognitive function is intact.  Memory is good.  Judgment is  fair.  Insight is partial.   DIAGNOSES:  AXIS I:  Major depressive disorder, single episode.  Rule  out substance-induced mood disorder.  Opiate dependence.  Benzodiazepine  abuse.  AXIS II:  Deferred.  AXIS III:  RSD left foot.  AXIS IV:  Problems with legal system, psychosocial problems, medical  problems, also access to health care services as patient is without  insurance for approximately three months.  AXIS V:  Current 35.   PLAN:  To stabilize mood and thinking.  Contract for safety.  Will detox  the patient with the clonidine protocol.  Will initiate Cymbalta and  reinforce medication compliance.  Will also have some Seroquel p.r.n.  for agitation.  The patient is to encourage fluids and attend groups.  [facility] is to look at any rehab programs available to patient.   TENTATIVE LENGTH OF STAY:  Three to four days.      Landry Corporal, N.P.       Geoffery Lyons, M.D.  Electronically Signed    JO/MEDQ  D:  02/27/2006  T:  02/28/2006  Job:  [id number]

## 2010-09-29 ENCOUNTER — Emergency Department (HOSPITAL_COMMUNITY)
Admission: EM | Admit: 2010-09-29 | Discharge: 2010-09-29 | Disposition: A | Discharge status: Home / Self Care | Payer: BC Managed Care – PPO | Attending: Emergency Medicine | Admitting: Emergency Medicine

## 2010-09-29 DIAGNOSIS — IMO0002 Reserved for concepts with insufficient information to code with codable children: Secondary | ICD-10-CM | POA: Insufficient documentation

## 2010-09-29 DIAGNOSIS — Z23 Encounter for immunization: Secondary | ICD-10-CM | POA: Insufficient documentation

## 2010-09-29 DIAGNOSIS — S91009A Unspecified open wound, unspecified ankle, initial encounter: Secondary | ICD-10-CM | POA: Insufficient documentation

## 2010-09-29 DIAGNOSIS — S81009A Unspecified open wound, unspecified knee, initial encounter: Secondary | ICD-10-CM | POA: Insufficient documentation

## 2010-09-29 DIAGNOSIS — R000 Tachycardia, unspecified: Secondary | ICD-10-CM | POA: Insufficient documentation

## 2010-09-29 DIAGNOSIS — W298XXA Contact with other powered powered hand tools and household machinery, initial encounter: Secondary | ICD-10-CM | POA: Insufficient documentation

## 2010-09-29 DIAGNOSIS — I10 Essential (primary) hypertension: Secondary | ICD-10-CM | POA: Insufficient documentation

## 2010-11-19 ENCOUNTER — Emergency Department (HOSPITAL_COMMUNITY): Payer: Commercial Managed Care - PPO

## 2010-11-19 ENCOUNTER — Emergency Department (HOSPITAL_COMMUNITY)
Admission: EM | Admit: 2010-11-19 | Discharge: 2010-11-19 | Disposition: A | Discharge status: Home / Self Care | Payer: Commercial Managed Care - PPO | Attending: Emergency Medicine | Admitting: Emergency Medicine

## 2010-11-19 DIAGNOSIS — I10 Essential (primary) hypertension: Secondary | ICD-10-CM | POA: Insufficient documentation

## 2010-11-19 DIAGNOSIS — G8929 Other chronic pain: Secondary | ICD-10-CM | POA: Insufficient documentation

## 2010-11-19 DIAGNOSIS — W208XXA Other cause of strike by thrown, projected or falling object, initial encounter: Secondary | ICD-10-CM | POA: Insufficient documentation

## 2010-11-19 DIAGNOSIS — G589 Mononeuropathy, unspecified: Secondary | ICD-10-CM | POA: Insufficient documentation

## 2010-11-19 DIAGNOSIS — M79609 Pain in unspecified limb: Secondary | ICD-10-CM | POA: Insufficient documentation

## 2010-11-19 IMAGING — CR DG FOOT COMPLETE 3+V*L*
3 series · 3 of 3 positions shown · non-contrast
Comparison: None

CLINICAL DATA: Injured foot.

LEFT FOOT - COMPLETE 3+ VIEW

[t foot ap left]
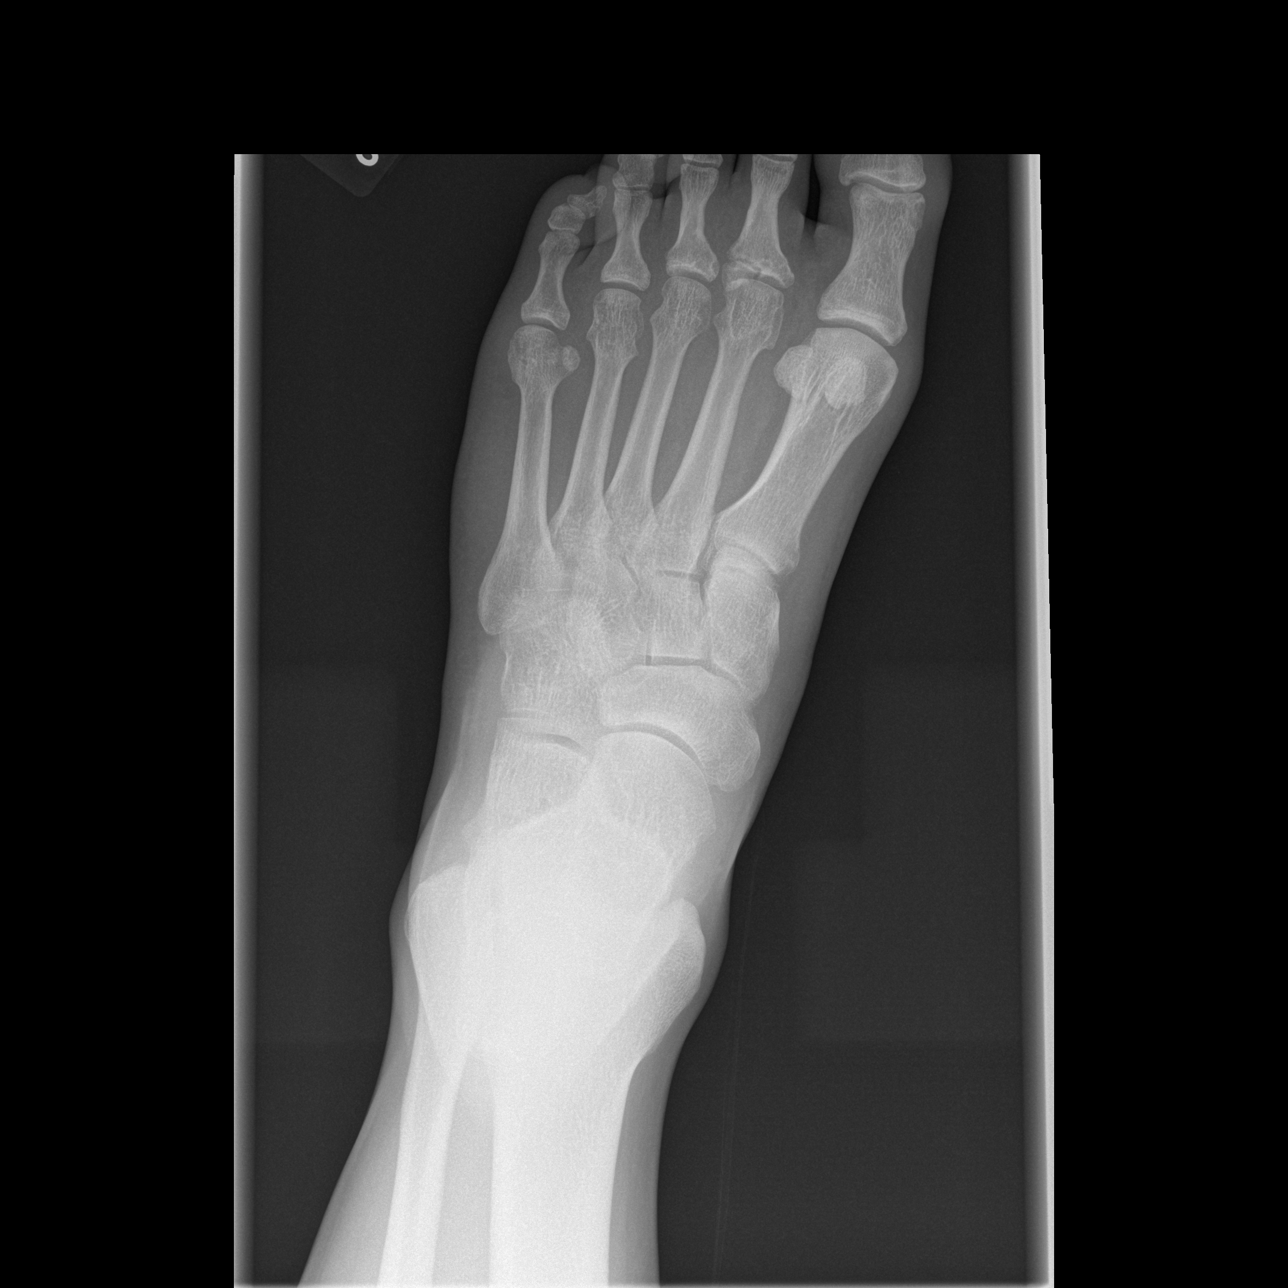

[t foot oblique left (1 of 2)]
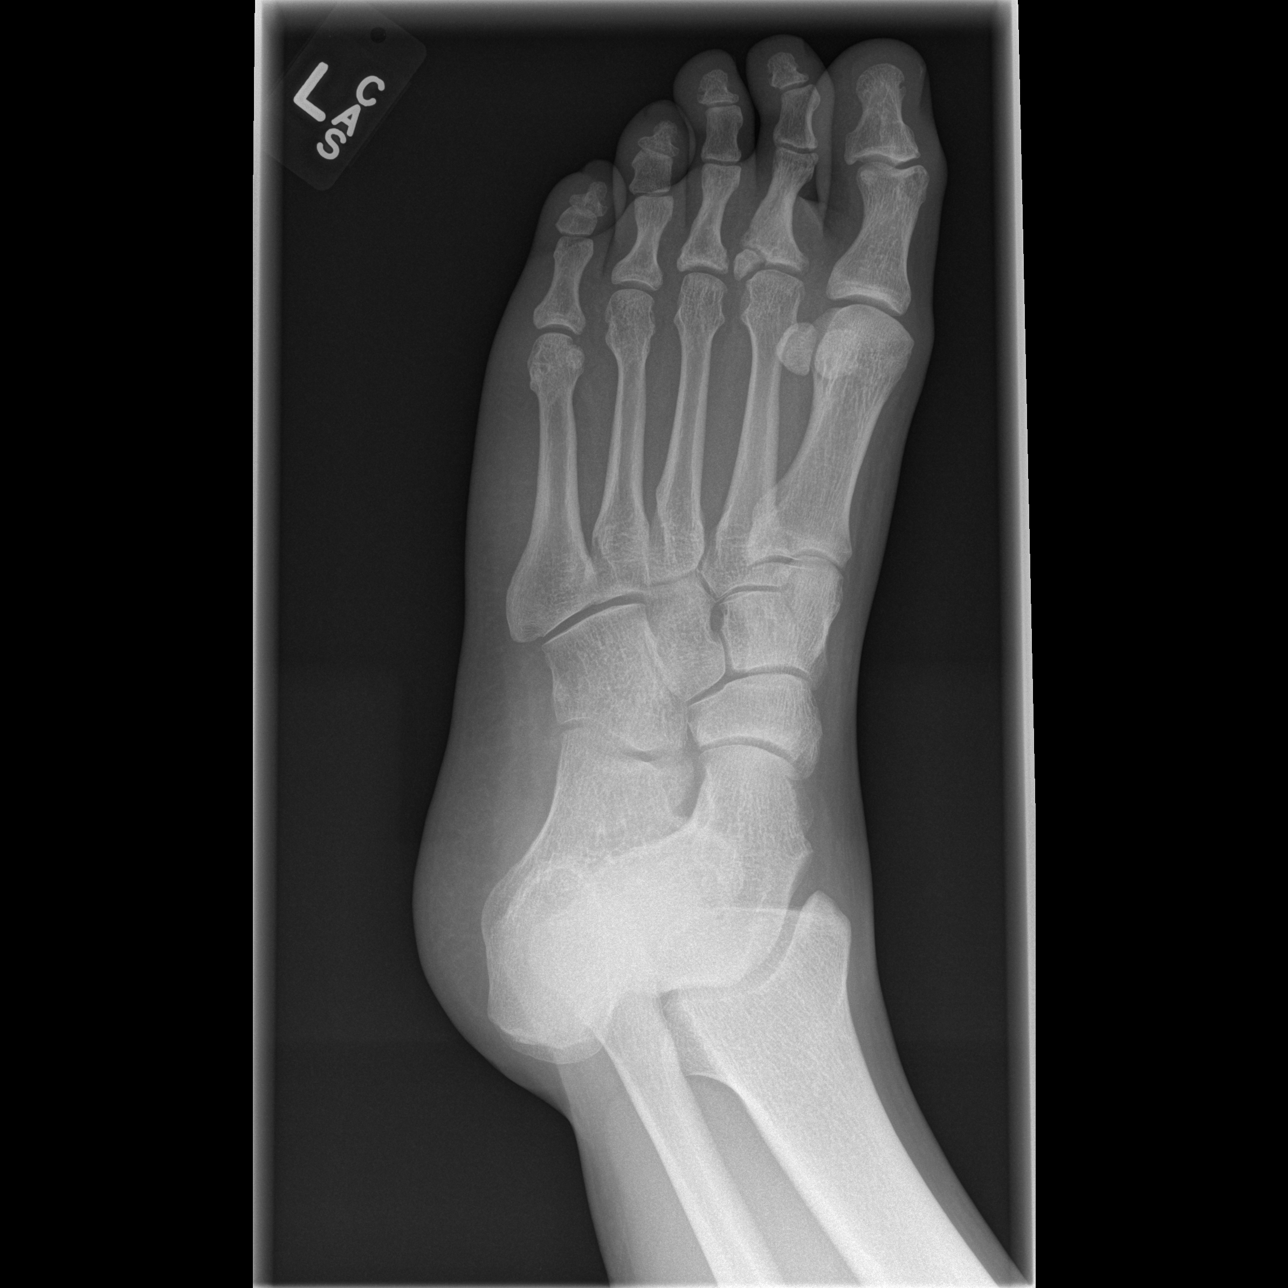

[t foot oblique left (2 of 2)]
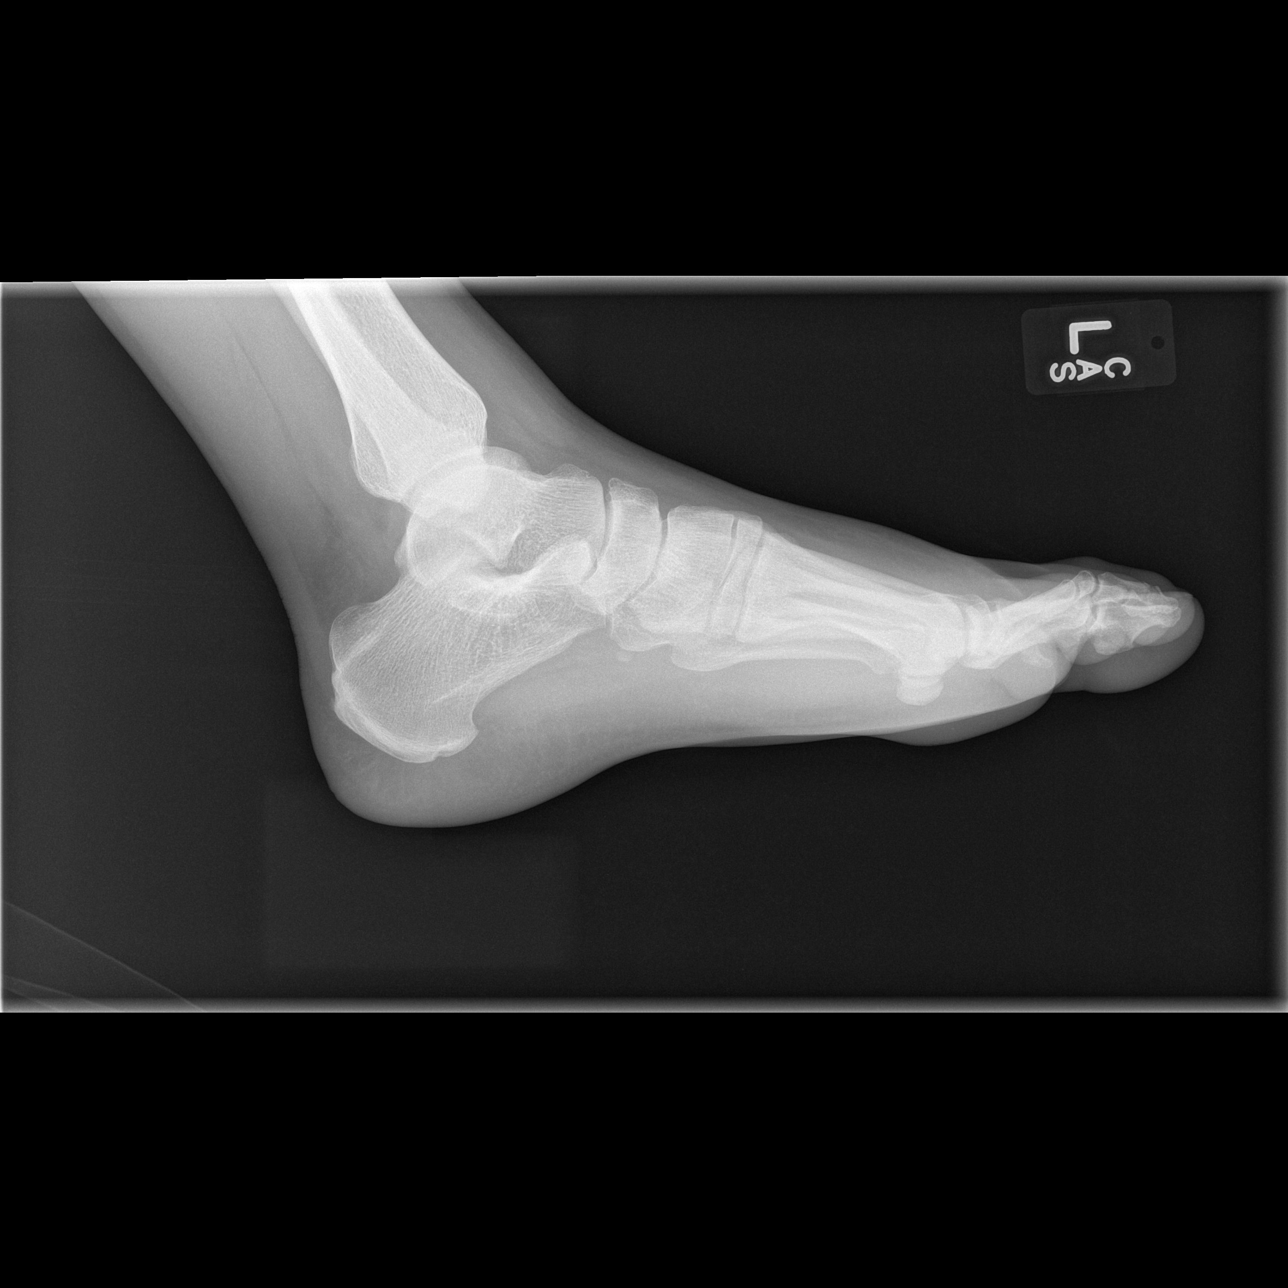

[3 of 3 positions shown; findings below may reference images not displayed]

FINDINGS: The joint spaces are maintained.  There is an old
ununited fracture involving the base of the second proximal
phalanx.  No acute fracture is identified.
IMPRESSION: 1.  Remote ununited intra-articular fracture of the second proximal
phalanx.
2.  No acute bony findings.

## 2010-12-02 ENCOUNTER — Emergency Department (HOSPITAL_COMMUNITY)
Admission: EM | Admit: 2010-12-02 | Discharge: 2010-12-02 | Disposition: A | Discharge status: Home / Self Care | Payer: Commercial Managed Care - PPO | Attending: Emergency Medicine | Admitting: Emergency Medicine

## 2010-12-02 DIAGNOSIS — G90529 Complex regional pain syndrome I of unspecified lower limb: Secondary | ICD-10-CM | POA: Insufficient documentation

## 2010-12-02 DIAGNOSIS — F411 Generalized anxiety disorder: Secondary | ICD-10-CM | POA: Insufficient documentation

## 2010-12-02 DIAGNOSIS — G8929 Other chronic pain: Secondary | ICD-10-CM | POA: Insufficient documentation

## 2010-12-02 DIAGNOSIS — R1110 Vomiting, unspecified: Secondary | ICD-10-CM | POA: Insufficient documentation

## 2011-01-04 ENCOUNTER — Emergency Department (HOSPITAL_COMMUNITY)
Admission: EM | Admit: 2011-01-04 | Discharge: 2011-01-04 | Disposition: A | Discharge status: Home / Self Care | Payer: Commercial Managed Care - PPO | Attending: Student | Admitting: Student

## 2011-01-04 ENCOUNTER — Emergency Department (HOSPITAL_COMMUNITY): Payer: Commercial Managed Care - PPO

## 2011-01-04 DIAGNOSIS — Y9239 Other specified sports and athletic area as the place of occurrence of the external cause: Secondary | ICD-10-CM | POA: Insufficient documentation

## 2011-01-04 DIAGNOSIS — X500XXA Overexertion from strenuous movement or load, initial encounter: Secondary | ICD-10-CM | POA: Insufficient documentation

## 2011-01-04 DIAGNOSIS — M25519 Pain in unspecified shoulder: Secondary | ICD-10-CM | POA: Insufficient documentation

## 2011-01-04 DIAGNOSIS — I10 Essential (primary) hypertension: Secondary | ICD-10-CM | POA: Insufficient documentation

## 2011-01-04 DIAGNOSIS — IMO0002 Reserved for concepts with insufficient information to code with codable children: Secondary | ICD-10-CM | POA: Insufficient documentation

## 2011-03-04 ENCOUNTER — Ambulatory Visit: Payer: Self-pay | Admitting: Family Medicine

## 2011-06-05 ENCOUNTER — Encounter (HOSPITAL_COMMUNITY): Payer: Self-pay | Admitting: *Deleted

## 2011-06-05 ENCOUNTER — Emergency Department (HOSPITAL_COMMUNITY)
Admission: EM | Admit: 2011-06-05 | Discharge: 2011-06-05 | Disposition: A | Discharge status: Home / Self Care | Payer: Self-pay | Source: Home / Self Care | Attending: Family Medicine | Admitting: Family Medicine

## 2011-06-05 DIAGNOSIS — G8929 Other chronic pain: Secondary | ICD-10-CM

## 2011-06-05 HISTORY — DX: Neuralgia and neuritis, unspecified: M79.2

## 2011-06-05 HISTORY — DX: Crushing injury of unspecified lower leg, initial encounter: S87.80XA

## 2011-06-05 MED ORDER — GABAPENTIN 400 MG PO CAPS: 400.0000 mg | ORAL_CAPSULE | Freq: Three times a day (TID) | ORAL | Status: DC

## 2011-06-05 MED ORDER — NORTRIPTYLINE HCL 75 MG PO CAPS: 75.0000 mg | ORAL_CAPSULE | Freq: Every day | ORAL | Status: DC

## 2011-06-05 MED ORDER — IBUPROFEN 800 MG PO TABS: 800.0000 mg | ORAL_TABLET | Freq: Three times a day (TID) | ORAL | Status: AC | PRN

## 2011-06-05 NOTE — ED Provider Notes (Signed)
History     CSN: [id number]  Arrival date & time 06/05/11  1919   First MD Initiated Contact with Patient 06/05/11 1952      Chief Complaint  Patient presents with  . Leg Pain    (Consider location/radiation/quality/duration/timing/severity/associated sxs/prior treatment) HPI Comments: 31 y/o male here c/o chronic pain in left foot and lower leg since a crush injury in 2002. Has missed his appointments at the pain management clinic due to job scheduled and is here requesting refills in his pain medications as he ran out of them 4 days ago. His appointment was rescheduled at the pain mgmt clinic for 07/12/11. Denies new injury or new complains his pain is diffused and same as previously. Patient taking over the counter ibuprofen took 2 pills today.   Past Medical History  Diagnosis Date  . Nerve pain   . Crush injury lower leg     History reviewed. No pertinent past surgical history.  History reviewed. No pertinent family history.  History  Substance Use Topics  . Smoking status: Never Smoker   . Smokeless tobacco: Not on file  . Alcohol Use: No      Review of Systems  All other systems reviewed and are negative.    Allergies  Aspirin; Codeine; Penicillins; and Tramadol  Home Medications   Current Outpatient Rx  Name Route Sig Dispense Refill  . OXYCODONE-ACETAMINOPHEN 10-325 MG PO TABS Oral Take 1 tablet by mouth every 4 (four) hours as needed.    Marland Kitchen GABAPENTIN 400 MG PO CAPS Oral Take 1 capsule (400 mg total) by mouth 3 (three) times daily. 90 capsule 0  . IBUPROFEN 800 MG PO TABS Oral Take 1 tablet (800 mg total) by mouth every 8 (eight) hours as needed for pain. 21 tablet 0  . NORTRIPTYLINE HCL 75 MG PO CAPS Oral Take 1 capsule (75 mg total) by mouth at bedtime. 30 capsule 0    BP 147/93  Pulse 95  Temp(Src) 98.4 F (36.9 C) (Oral)  Resp 18  SpO2 99%  Physical Exam  Nursing note and vitals reviewed. Constitutional: He is oriented to person, place, and  time. He appears well-developed and well-nourished. No distress.  Cardiovascular: Normal heart sounds.   Pulmonary/Chest: Breath sounds normal.  Musculoskeletal:       hyperpigmented skin from well healed scars in dorsum of left foot and lower leg. No obvious deformity, no erythema, redness, swelling or distal cyanosis. dorso pedial and tibial posterior pulses patent.   Neurological: He is alert and oriented to person, place, and time.    ED Course  Procedures (including critical care time)  Labs Reviewed - No data to display No results found.   1. Chronic pain       MDM  Refilled gabapentin and nortriptyline for 1 month. Gave Ibuprofen prescription for 800 mg tid prn. Reccommended to follow up with pain management clinic for future pain medication refills, specially for narcotic prescription as he is likely bound to a contract.         Sharin Grave, MD 06/07/11 [phone]

## 2011-06-05 NOTE — ED Notes (Signed)
States left leg pain from crush injury in 2002 and has been seen at pain clinic.  Unable to be seen at pain clinic due to work and is out of pain med.  Has appt. 4/20 w/ clinic.

## 2011-06-05 NOTE — Discharge Instructions (Signed)
Take the prescribed medications as instructed. Followup with pain management clinic as scheduled.

## 2011-06-11 DIAGNOSIS — G8929 Other chronic pain: Secondary | ICD-10-CM | POA: Insufficient documentation

## 2011-06-30 ENCOUNTER — Encounter (HOSPITAL_COMMUNITY): Payer: Self-pay | Admitting: Emergency Medicine

## 2011-06-30 ENCOUNTER — Emergency Department (HOSPITAL_COMMUNITY)
Admission: EM | Admit: 2011-06-30 | Discharge: 2011-06-30 | Disposition: A | Discharge status: Home / Self Care | Payer: Self-pay | Attending: Emergency Medicine | Admitting: Emergency Medicine

## 2011-06-30 DIAGNOSIS — X500XXA Overexertion from strenuous movement or load, initial encounter: Secondary | ICD-10-CM | POA: Insufficient documentation

## 2011-06-30 DIAGNOSIS — M25569 Pain in unspecified knee: Secondary | ICD-10-CM | POA: Insufficient documentation

## 2011-06-30 DIAGNOSIS — Y9239 Other specified sports and athletic area as the place of occurrence of the external cause: Secondary | ICD-10-CM | POA: Insufficient documentation

## 2011-06-30 DIAGNOSIS — Y9364 Activity, baseball: Secondary | ICD-10-CM | POA: Insufficient documentation

## 2011-06-30 DIAGNOSIS — Z79899 Other long term (current) drug therapy: Secondary | ICD-10-CM | POA: Insufficient documentation

## 2011-06-30 DIAGNOSIS — S8392XA Sprain of unspecified site of left knee, initial encounter: Secondary | ICD-10-CM

## 2011-06-30 DIAGNOSIS — M25469 Effusion, unspecified knee: Secondary | ICD-10-CM | POA: Insufficient documentation

## 2011-06-30 DIAGNOSIS — IMO0002 Reserved for concepts with insufficient information to code with codable children: Secondary | ICD-10-CM | POA: Insufficient documentation

## 2011-06-30 MED ORDER — NAPROXEN 500 MG PO TABS: 500.0000 mg | ORAL_TABLET | Freq: Two times a day (BID) | ORAL | Status: DC

## 2011-06-30 NOTE — ED Notes (Signed)
Pt presenting to ed with c/o left knee pain since playing baseball on Thursday and hearing a pop pt states knee has buckled a couple of times. Pt states pain isn't getting in better. Pt also with c/o congestion.

## 2011-06-30 NOTE — ED Provider Notes (Signed)
History     CSN: [id number]  Arrival date & time 06/30/11  1234   First MD Initiated Contact with Patient 06/30/11 1422      Chief Complaint  Patient presents with  . Knee Pain    (Consider location/radiation/quality/duration/timing/severity/associated sxs/prior treatment) Patient is a 31 y.o. male presenting with knee pain. The history is provided by the patient.  Knee Pain This is a new problem. Associated symptoms include arthralgias and joint swelling.  Pt states he was playing softball last week when he fell twisting left knee. States then fell again two days ago and today states his knee "gave out." Pain to the medial joint. Worsened with walking, bending knee. No other injuries. No prior knee problems. Pt is able to ambulate.   Past Medical History  Diagnosis Date  . Nerve pain   . Crush injury lower leg     History reviewed. No pertinent past surgical history.  No family history on file.  History  Substance Use Topics  . Smoking status: Current Everyday Smoker  . Smokeless tobacco: Not on file  . Alcohol Use: No      Review of Systems  Musculoskeletal: Positive for joint swelling and arthralgias.  All other systems reviewed and are negative.    Allergies  Aspirin; Codeine; Penicillins; and Tramadol  Home Medications   Current Outpatient Rx  Name Route Sig Dispense Refill  . GABAPENTIN 400 MG PO CAPS Oral Take 1 capsule (400 mg total) by mouth 3 (three) times daily. 90 capsule 0  . IBUPROFEN 200 MG PO TABS Oral Take 800 mg by mouth every 6 (six) hours as needed. pain    . LAMOTRIGINE 25 MG PO TABS Oral Take 25 mg by mouth daily.    Marland Kitchen NORTRIPTYLINE HCL 75 MG PO CAPS Oral Take 1 capsule (75 mg total) by mouth at bedtime. 30 capsule 0  . QUETIAPINE FUMARATE 100 MG PO TABS Oral Take 100 mg by mouth at bedtime.    Marland Kitchen NAPROXEN 500 MG PO TABS Oral Take 1 tablet (500 mg total) by mouth 2 (two) times daily. 30 tablet 0    BP 165/104  Pulse 108  Temp(Src) 98.9  F (37.2 C) (Oral)  Resp 20  SpO2 100%  Physical Exam  Nursing note and vitals reviewed. Constitutional: He is oriented to person, place, and time. He appears well-developed and well-nourished. No distress.  HENT:  Head: Normocephalic.  Eyes: Conjunctivae are normal.  Neck: Neck supple.  Cardiovascular: Normal rate, regular rhythm and normal heart sounds.   Pulmonary/Chest: Effort normal and breath sounds normal. No respiratory distress.  Abdominal: Soft. Bowel sounds are normal. He exhibits no distension. There is no tenderness.  Musculoskeletal:       Left knee normal appearing, no bruising, swelling. Full ROM of the joint. Tender to palpation over medial joint. Joint stable with negative anterior, posterior drawer signs. No laxity or pain with medial or lateral stress. Patellar tendon intact  Neurological: He is alert and oriented to person, place, and time.  Skin: Skin is warm and dry.  Psychiatric: He has a normal mood and affect.    ED Course  Procedures (including critical care time)  Joint normal in appearance. Full ROM. Joint is stable. Do not think any bony abnormalities, pt walking on his leg with no distress. Will refer to orthopedics, knee sleeve given.     1. Sprain of left knee       MDM  Lottie Mussel, PA 06/30/11 1505

## 2011-06-30 NOTE — Discharge Instructions (Signed)
Your knee exam does not indicate any major injuries. Make sure you keep your knee elevated. Ice it several times a day. Knee sleeve for swelling and pain. Naprosyn for pain and inflammation. If not improving follow up with orthopedics or primary care doctor.   Knee Pain The knee is the complex joint between your thigh and your lower leg. It is made up of bones, tendons, ligaments, and cartilage. The bones that make up the knee are:  The femur in the thigh.   The tibia and fibula in the lower leg.   The patella or kneecap riding in the groove on the lower femur.  CAUSES  Knee pain is a common complaint with many causes. A few of these causes are:  Injury, such as:   A ruptured ligament or tendon injury.   Torn cartilage.   Medical conditions, such as:   Gout   Arthritis   Infections   Overuse, over training or overdoing a physical activity.  Knee pain can be minor or severe. Knee pain can accompany debilitating injury. Minor knee problems often respond well to self-care measures or get well on their own. More serious injuries may need medical intervention or even surgery. SYMPTOMS The knee is complex. Symptoms of knee problems can vary widely. Some of the problems are:  Pain with movement and weight bearing.   Swelling and tenderness.   Buckling of the knee.   Inability to straighten or extend your knee.   Your knee locks and you cannot straighten it.   Warmth and redness with pain and fever.   Deformity or dislocation of the kneecap.  DIAGNOSIS  Determining what is wrong may be very straight forward such as when there is an injury. It can also be challenging because of the complexity of the knee. Tests to make a diagnosis may include:  Your caregiver taking a history and doing a physical exam.   Routine X-rays can be used to rule out other problems. X-rays will not reveal a cartilage tear. Some injuries of the knee can be diagnosed by:   Arthroscopy a surgical  technique by which a small video camera is inserted through tiny incisions on the sides of the knee. This procedure is used to examine and repair internal knee joint problems. Tiny instruments can be used during arthroscopy to repair the torn knee cartilage (meniscus).   Arthrography is a radiology technique. A contrast liquid is directly injected into the knee joint. Internal structures of the knee joint then become visible on X-ray film.   An MRI scan is a non x-ray radiology procedure in which magnetic fields and a computer produce two- or three-dimensional images of the inside of the knee. Cartilage tears are often visible using an MRI scanner. MRI scans have largely replaced arthrography in diagnosing cartilage tears of the knee.   Blood work.   Examination of the fluid that helps to lubricate the knee joint (synovial fluid). This is done by taking a sample out using a needle and a syringe.  TREATMENT The treatment of knee problems depends on the cause. Some of these treatments are:  Depending on the injury, proper casting, splinting, surgery or physical therapy care will be needed.   Give yourself adequate recovery time. Do not overuse your joints. If you begin to get sore during workout routines, back off. Slow down or do fewer repetitions.   For repetitive activities such as cycling or running, maintain your strength and nutrition.   Alternate muscle groups. For  example if you are a weight lifter, work the upper body on one day and the lower body the next.   Either tight or weak muscles do not give the proper support for your knee. Tight or weak muscles do not absorb the stress placed on the knee joint. Keep the muscles surrounding the knee strong.   Take care of mechanical problems.   If you have flat feet, orthotics or special shoes may help. See your caregiver if you need help.   Arch supports, sometimes with wedges on the inner or outer aspect of the heel, can help. These can  shift pressure away from the side of the knee most bothered by osteoarthritis.   A brace called an "unloader" brace also may be used to help ease the pressure on the most arthritic side of the knee.   If your caregiver has prescribed crutches, braces, wraps or ice, use as directed. The acronym for this is PRICE. This means protection, rest, ice, compression and elevation.   Nonsteroidal anti-inflammatory drugs (NSAID's), can help relieve pain. But if taken immediately after an injury, they may actually increase swelling. Take NSAID's with food in your stomach. Stop them if you develop stomach problems. Do not take these if you have a history of ulcers, stomach pain or bleeding from the bowel. Do not take without your caregiver's approval if you have problems with fluid retention, heart failure, or kidney problems.   For ongoing knee problems, physical therapy may be helpful.   Glucosamine and chondroitin are over-the-counter dietary supplements. Both may help relieve the pain of osteoarthritis in the knee. These medicines are different from the usual anti-inflammatory drugs. Glucosamine may decrease the rate of cartilage destruction.   Injections of a corticosteroid drug into your knee joint may help reduce the symptoms of an arthritis flare-up. They may provide pain relief that lasts a few months. You may have to wait a few months between injections. The injections do have a small increased risk of infection, water retention and elevated blood sugar levels.   Hyaluronic acid injected into damaged joints may ease pain and provide lubrication. These injections may work by reducing inflammation. A series of shots may give relief for as long as 6 months.   Topical painkillers. Applying certain ointments to your skin may help relieve the pain and stiffness of osteoarthritis. Ask your pharmacist for suggestions. Many over the-counter products are approved for temporary relief of arthritis pain.   In  some countries, doctors often prescribe topical NSAID's for relief of chronic conditions such as arthritis and tendinitis. A review of treatment with NSAID creams found that they worked as well as oral medications but without the serious side effects.  PREVENTION  Maintain a healthy weight. Extra pounds put more strain on your joints.   Get strong, stay limber. Weak muscles are a common cause of knee injuries. Stretching is important. Include flexibility exercises in your workouts.   Be smart about exercise. If you have osteoarthritis, chronic knee pain or recurring injuries, you may need to change the way you exercise. This does not mean you have to stop being active. If your knees ache after jogging or playing basketball, consider switching to swimming, water aerobics or other low-impact activities, at least for a few days a week. Sometimes limiting high-impact activities will provide relief.   Make sure your shoes fit well. Choose footwear that is right for your sport.   Protect your knees. Use the proper gear for knee-sensitive activities. Use  kneepads when playing volleyball or laying carpet. Buckle your seat belt every time you drive. Most shattered kneecaps occur in car accidents.   Rest when you are tired.  SEEK MEDICAL CARE IF:  You have knee pain that is continual and does not seem to be getting better.  SEEK IMMEDIATE MEDICAL CARE IF:  Your knee joint feels hot to the touch and you have a high fever. MAKE SURE YOU:   Understand these instructions.   Will watch your condition.   Will get help right away if you are not doing well or get worse.  Document Released: 01/05/2007 Document Revised: 02/27/2011 Document Reviewed: 01/05/2007 [facility] Patient Information 2012 [place], [place].

## 2011-07-01 NOTE — ED Provider Notes (Signed)
Medical screening examination/treatment/procedure(s) were performed by non-physician practitioner and as supervising physician I was immediately available for consultation/collaboration.  Korde Jeppsen T Keita Demarco, MD 07/01/11 0756 

## 2011-08-04 IMAGING — CR DG SHOULDER 2+V*L*
3 series · 3 of 3 positions shown · non-contrast
Comparison: None

CLINICAL DATA: Pain

LEFT SHOULDER - 2+ VIEW

[w shoulder internal left]
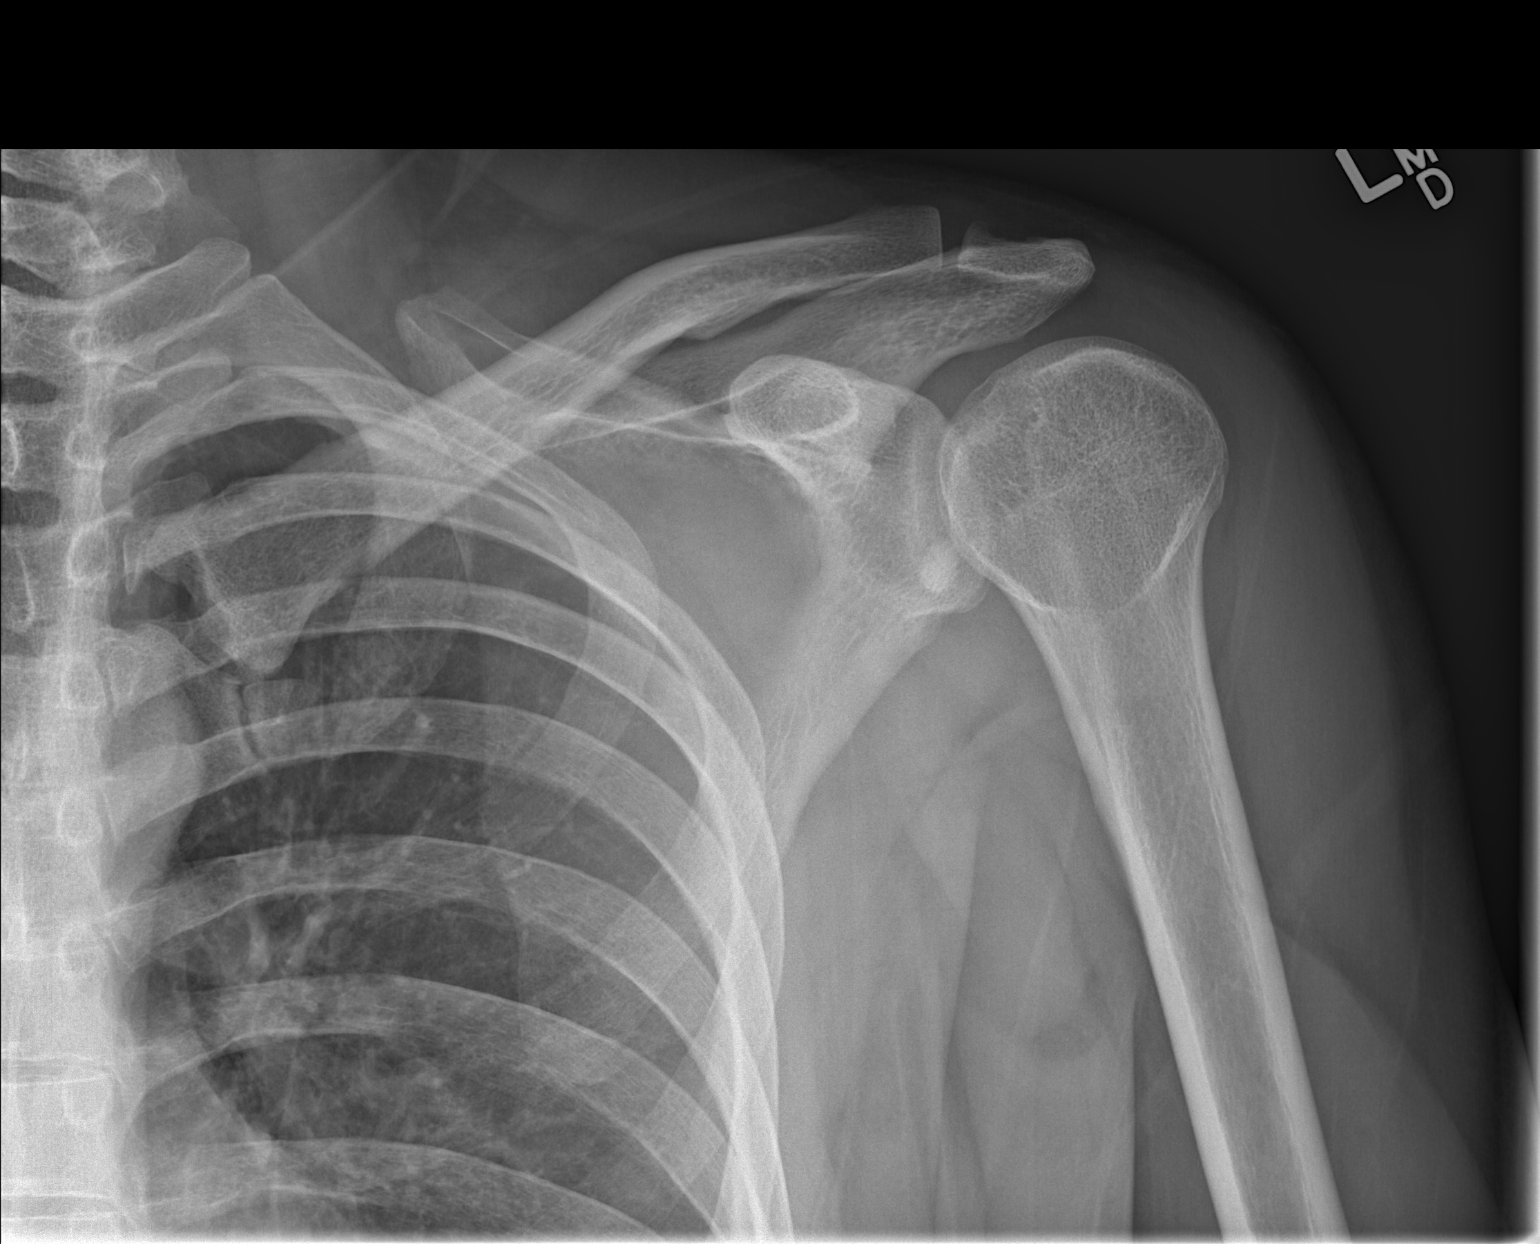

[w shoulder external left]
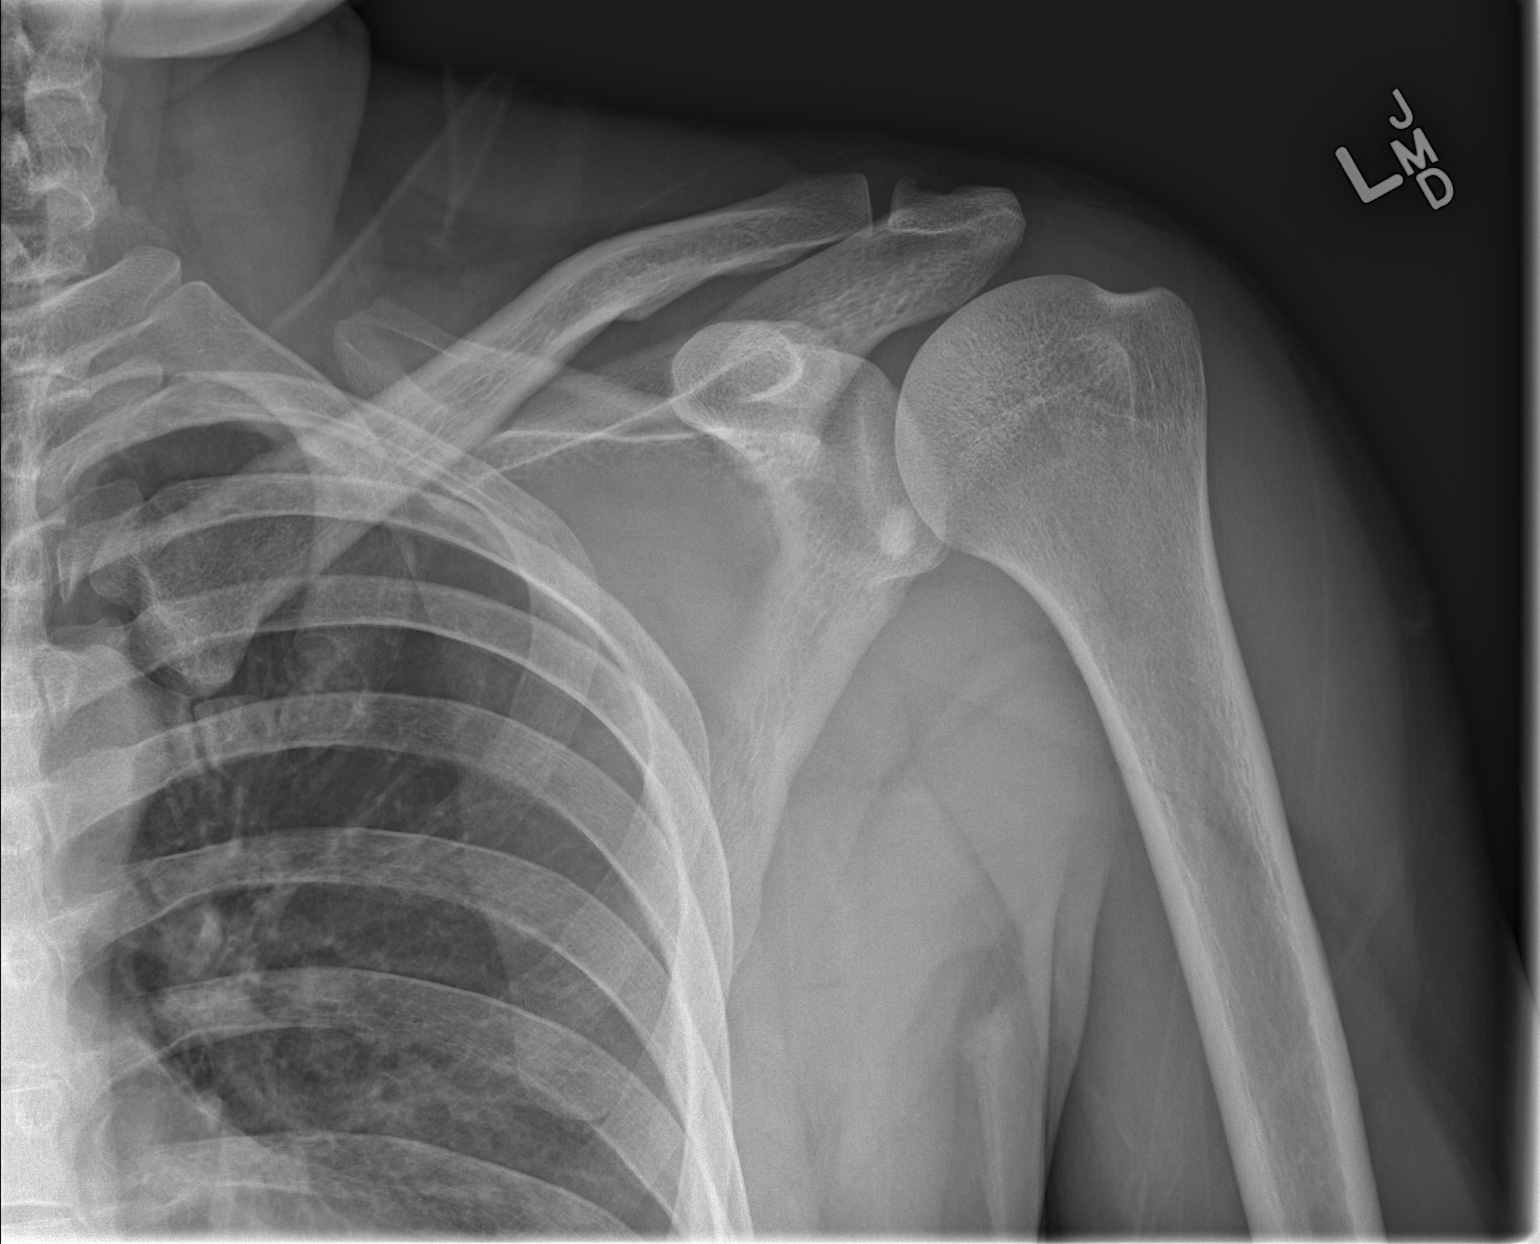

[w shoulder y-view left]
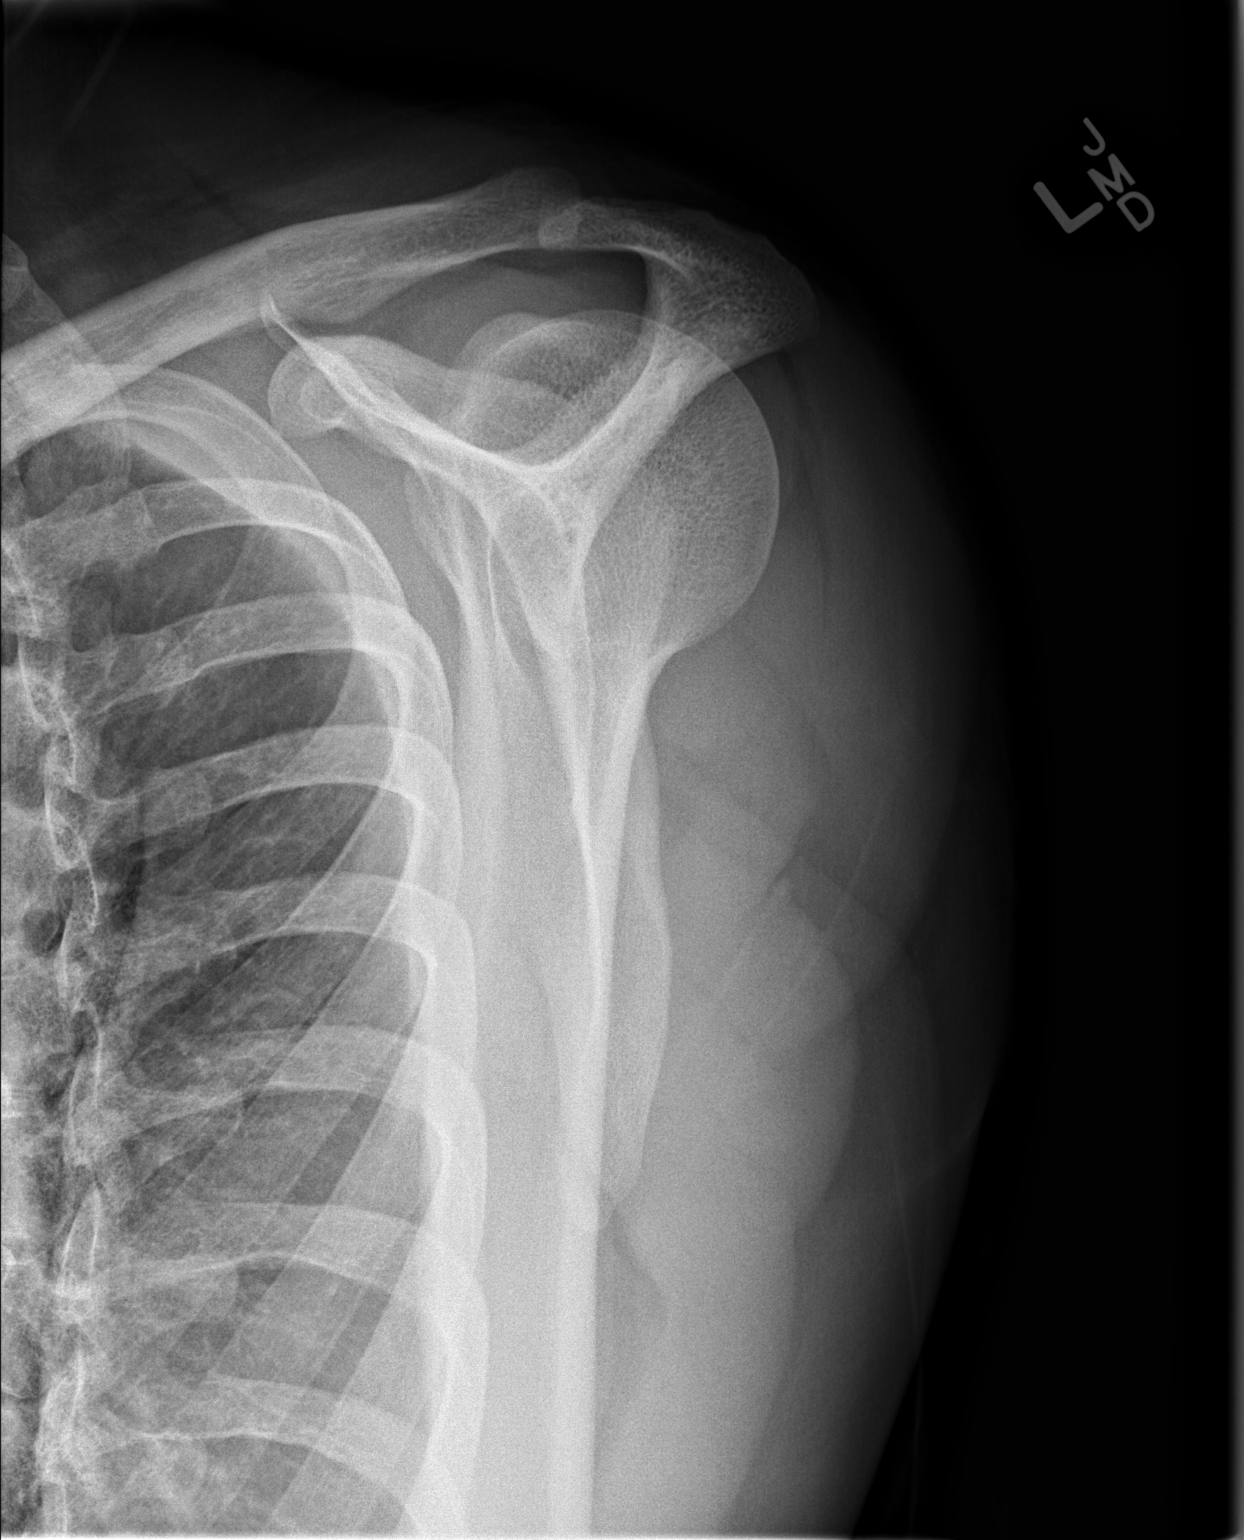

[3 of 3 positions shown; findings below may reference images not displayed]

FINDINGS: There is no acute fracture or subluxation identified.

There is no significant arthropathy.

Sclerotic density within the inferior glenoid, likely bone island.
IMPRESSION: 1.  Negative exam.

## 2011-10-02 ENCOUNTER — Emergency Department (HOSPITAL_COMMUNITY)
Admission: EM | Admit: 2011-10-02 | Discharge: 2011-10-02 | Disposition: A | Discharge status: Home / Self Care | Payer: Self-pay | Attending: Emergency Medicine | Admitting: Emergency Medicine

## 2011-10-02 ENCOUNTER — Encounter (HOSPITAL_COMMUNITY): Payer: Self-pay | Admitting: *Deleted

## 2011-10-02 DIAGNOSIS — F17200 Nicotine dependence, unspecified, uncomplicated: Secondary | ICD-10-CM | POA: Insufficient documentation

## 2011-10-02 DIAGNOSIS — K047 Periapical abscess without sinus: Secondary | ICD-10-CM | POA: Insufficient documentation

## 2011-10-02 MED ORDER — HYDROCODONE-ACETAMINOPHEN 5-325 MG PO TABS: 1.0000 | ORAL_TABLET | Freq: Four times a day (QID) | ORAL | Status: AC | PRN

## 2011-10-02 MED ORDER — CLINDAMYCIN HCL 150 MG PO CAPS: ORAL_CAPSULE | ORAL | Status: DC

## 2011-10-02 NOTE — ED Notes (Signed)
Pt in c/o toothache x2-3 days, thinks he has an abscess

## 2011-10-02 NOTE — ED Provider Notes (Signed)
History     CSN: [id number]  Arrival date & time 10/02/11  0020   First MD Initiated Contact with Patient 10/02/11 [phone]      Chief Complaint  Patient presents with  . Dental Pain    (Consider location/radiation/quality/duration/timing/severity/associated sxs/prior treatment) HPI Patient presents emergency department with right upper dental pain.  Patient, states that he has a dental appointment scheduled for the beginning of next week.  Patient, states that he has had multiple dental problems in the past.  Patient, states the gum feels swollen and tender to touch.  Patient has fever, swelling under the tongue, neck swelling, weakness, nausea, or vomiting.  Patient denies taking anything prior to arrival other than Advil and Orajel with minimal relief. Past Medical History  Diagnosis Date  . Nerve pain   . Crush injury lower leg     History reviewed. No pertinent past surgical history.  History reviewed. No pertinent family history.  History  Substance Use Topics  . Smoking status: Current Everyday Smoker  . Smokeless tobacco: Not on file  . Alcohol Use: No      Review of Systems All other systems negative except as documented in the HPI. All pertinent positives and negatives as reviewed in the HPI.  Allergies  Aspirin; Codeine; Penicillins; and Tramadol  Home Medications   Current Outpatient Rx  Name Route Sig Dispense Refill  . BENZOCAINE 10 % MT GEL Mouth/Throat Use as directed 1 application in the mouth or throat 3 (three) times daily as needed. For pain    . IBUPROFEN 200 MG PO TABS Oral Take 800 mg by mouth every 6 (six) hours as needed. pain    . NAPROXEN 500 MG PO TABS Oral Take 500 mg by mouth as needed. For pain      BP 135/97  Pulse 114  Temp 97.5 F (36.4 C) (Oral)  Resp 20  SpO2 100%  Physical Exam  Nursing note and vitals reviewed. Constitutional: He appears well-developed and well-nourished.  HENT:  Head: Normocephalic and atraumatic. No  trismus in the jaw.  Mouth/Throat: Uvula is midline, oropharynx is clear and moist and mucous membranes are normal. Abnormal dentition. No uvula swelling.      ED Course  Procedures (including critical care time)  Patient will be referred to the dentist that he has an appointment with.  He is told to return here for any worsening in his condition.  MDM         Carlyle Dolly, PA-C 10/02/11 0421

## 2011-10-03 NOTE — ED Provider Notes (Signed)
Medical screening examination/treatment/procedure(s) were performed by non-physician practitioner and as supervising physician I was immediately available for consultation/collaboration.  Aamani Moose, MD 10/03/11 0002 

## 2011-12-09 ENCOUNTER — Encounter (HOSPITAL_COMMUNITY): Payer: Self-pay | Admitting: *Deleted

## 2011-12-09 ENCOUNTER — Emergency Department (HOSPITAL_COMMUNITY)
Admission: EM | Admit: 2011-12-09 | Discharge: 2011-12-10 | Disposition: A | Discharge status: Home / Self Care | Payer: Self-pay | Attending: Emergency Medicine | Admitting: Emergency Medicine

## 2011-12-09 DIAGNOSIS — S6000XA Contusion of unspecified finger without damage to nail, initial encounter: Secondary | ICD-10-CM | POA: Insufficient documentation

## 2011-12-09 DIAGNOSIS — Z23 Encounter for immunization: Secondary | ICD-10-CM | POA: Insufficient documentation

## 2011-12-09 DIAGNOSIS — Z888 Allergy status to other drugs, medicaments and biological substances status: Secondary | ICD-10-CM | POA: Insufficient documentation

## 2011-12-09 DIAGNOSIS — IMO0002 Reserved for concepts with insufficient information to code with codable children: Secondary | ICD-10-CM | POA: Insufficient documentation

## 2011-12-09 DIAGNOSIS — T148XXA Other injury of unspecified body region, initial encounter: Secondary | ICD-10-CM | POA: Insufficient documentation

## 2011-12-09 DIAGNOSIS — Z880 Allergy status to penicillin: Secondary | ICD-10-CM | POA: Insufficient documentation

## 2011-12-09 DIAGNOSIS — W268XXA Contact with other sharp object(s), not elsewhere classified, initial encounter: Secondary | ICD-10-CM | POA: Insufficient documentation

## 2011-12-09 DIAGNOSIS — F17200 Nicotine dependence, unspecified, uncomplicated: Secondary | ICD-10-CM | POA: Insufficient documentation

## 2011-12-09 DIAGNOSIS — W19XXXA Unspecified fall, initial encounter: Secondary | ICD-10-CM | POA: Insufficient documentation

## 2011-12-09 DIAGNOSIS — Z885 Allergy status to narcotic agent status: Secondary | ICD-10-CM | POA: Insufficient documentation

## 2011-12-09 NOTE — ED Notes (Signed)
Pt states he was moving a piece of sheet metal when another piece slipped and cut his left forearm. Pt has 5cm lac to left posterior forearm, bleeding controlled. Also has multiple scratches down same arm.

## 2011-12-10 ENCOUNTER — Emergency Department (HOSPITAL_COMMUNITY): Payer: Self-pay

## 2011-12-10 IMAGING — CR DG FINGER MIDDLE 2+V*R*
3 series · 3 of 3 positions shown · non-contrast
Comparison: Plain films [DATE].

CLINICAL DATA: Injury 3 days ago.  Pain and swelling.

RIGHT MIDDLE FINGER 2+V

[x finger pa right]
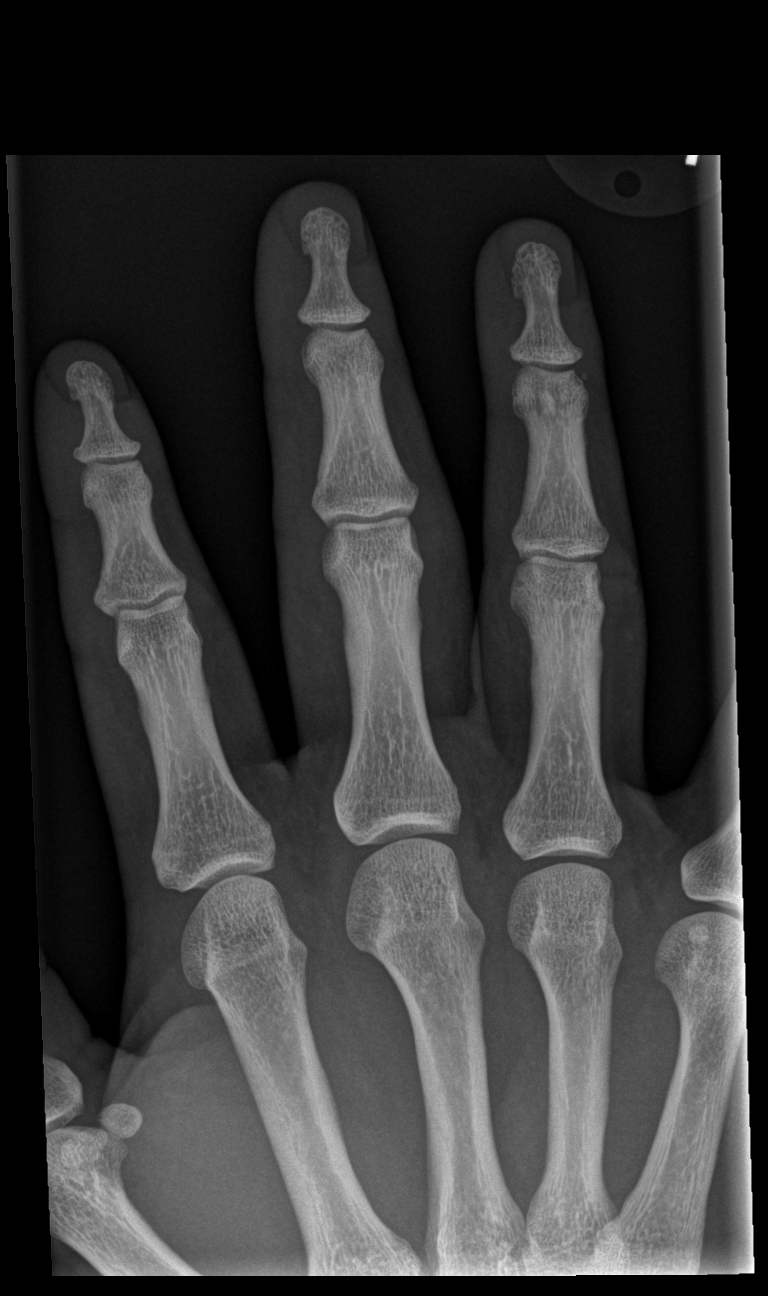

[x finger obl right]
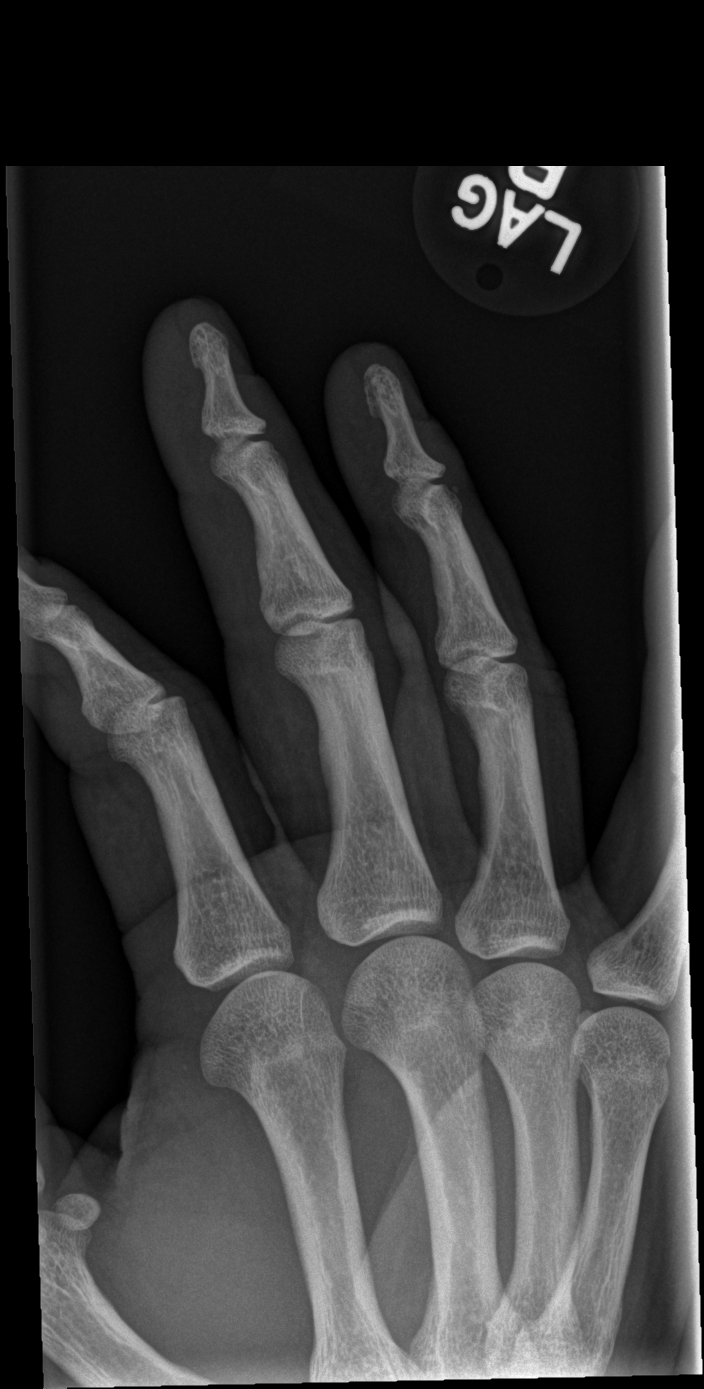

[x finger lat right]
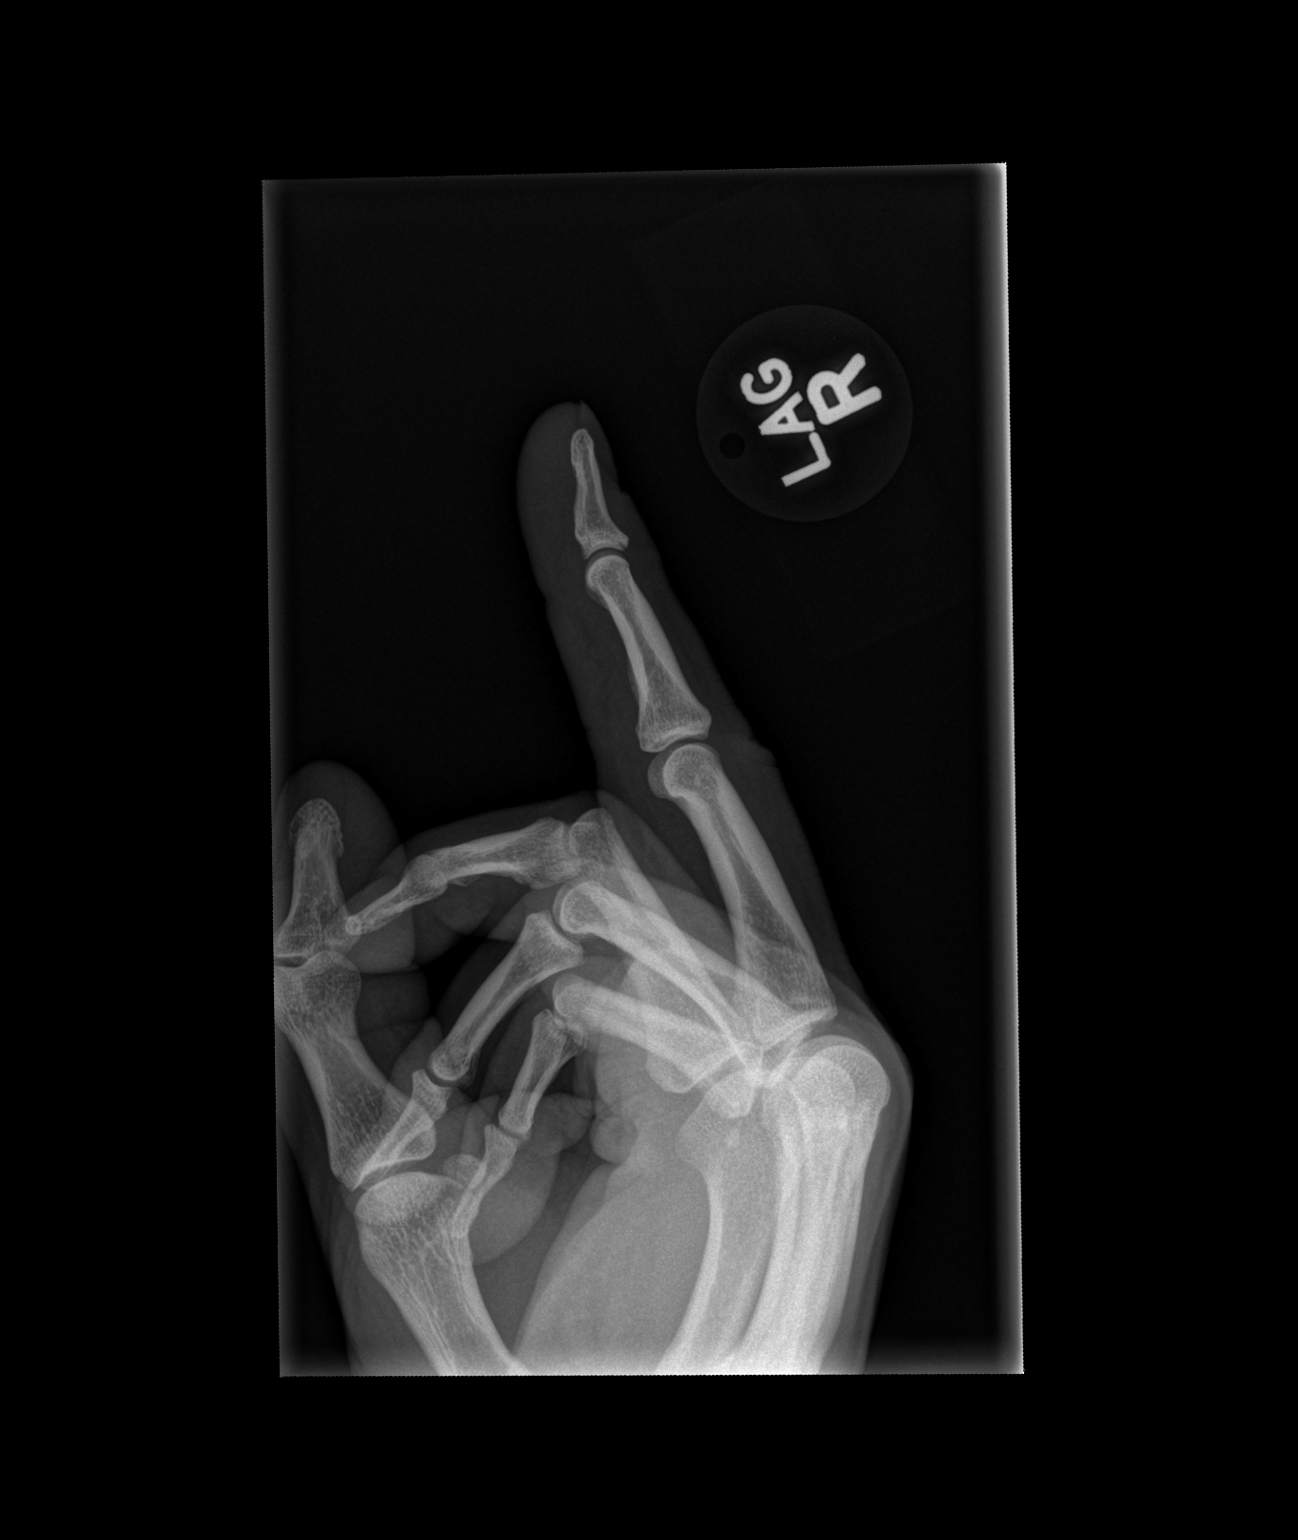

[3 of 3 positions shown; findings below may reference images not displayed]

FINDINGS: Soft tissue swelling is present about the long finger.
No fracture, dislocation or radiopaque foreign body.
IMPRESSION: Soft tissue swelling without underlying bony or joint abnormality.

## 2011-12-10 MED ORDER — IBUPROFEN 800 MG PO TABS
ORAL_TABLET | ORAL | Status: AC
  Filled 2011-12-10: qty 1

## 2011-12-10 MED ORDER — TETANUS-DIPHTH-ACELL PERTUSSIS 5-2.5-18.5 LF-MCG/0.5 IM SUSP
0.5000 mL | Freq: Once | INTRAMUSCULAR | Status: AC
  Administered 2011-12-10: 0.5 mL via INTRAMUSCULAR
  Filled 2011-12-10: qty 0.5

## 2011-12-10 MED ORDER — IBUPROFEN 800 MG PO TABS
800.0000 mg | ORAL_TABLET | Freq: Once | ORAL | Status: AC
  Administered 2011-12-10: 800 mg via ORAL

## 2011-12-10 NOTE — ED Provider Notes (Signed)
Medical screening examination/treatment/procedure(s) were performed by non-physician practitioner and as supervising physician I was immediately available for consultation/collaboration.   Burna Atlas, MD 12/10/11 0723 

## 2011-12-10 NOTE — ED Notes (Signed)
Patient is alert and oriented x3.  He was given DC instructions and follow up visit instructions.  Patient gave verbal understanding.  He was DC ambulatory under his own power to home.  V/S stable.  He was not showing any signs of distress on DC 

## 2011-12-10 NOTE — ED Notes (Signed)
Patient ambulated to X-ray 

## 2011-12-10 NOTE — Discharge Instructions (Signed)
Abrasions Abrasions are skin scrapes. Their treatment depends on how large and deep the abrasion is. Abrasions do not extend through all layers of the skin. A cut or lesion through all skin layers is called a laceration. HOME CARE INSTRUCTIONS   If you were given a dressing, change it at least once a day or as instructed by your caregiver. If the bandage sticks, soak it off with a solution of water or hydrogen peroxide.   Twice a day, wash the area with soap and water to remove all the cream/ointment. You may do this in a sink, under a tub faucet, or in a shower. Rinse off the soap and pat dry with a clean towel. Look for signs of infection (see below).   Reapply cream/ointment according to your caregiver's instruction. This will help prevent infection and keep the bandage from sticking. Telfa or gauze over the wound and under the dressing or wrap will also help keep the bandage from sticking.   If the bandage becomes wet, dirty, or develops a foul smell, change it as soon as possible.   Only take over-the-counter or prescription medicines for pain, discomfort, or fever as directed by your caregiver.  SEEK IMMEDIATE MEDICAL CARE IF:   Increasing pain in the wound.   Signs of infection develop: redness, swelling, surrounding area is tender to touch, or pus coming from the wound.   You have a fever.   Any foul smell coming from the wound or dressing.  Most skin wounds heal within ten days. Facial wounds heal faster. However, an infection may occur despite proper treatment. You should have the wound checked for signs of infection within 24 to 48 hours or sooner if problems arise. If you were not given a wound-check appointment, look closely at the wound yourself on the second day for early signs of infection listed above. MAKE SURE YOU:   Understand these instructions.   Will watch your condition.   Will get help right away if you are not doing well or get worse.  Document Released:  12/18/2004 Document Revised: 02/27/2011 Document Reviewed: 02/11/2011 [facility] Patient Information 2012 [place], [place].Contusion A contusion is a deep bruise. Contusions are the result of an injury that caused bleeding under the skin. The contusion may turn blue, purple, or yellow. Minor injuries will give you a painless contusion, but more severe contusions may stay painful and swollen for a few weeks.  CAUSES  A contusion is usually caused by a blow, trauma, or direct force to an area of the body. SYMPTOMS   Swelling and redness of the injured area.   Bruising of the injured area.   Tenderness and soreness of the injured area.   Pain.  DIAGNOSIS  The diagnosis can be made by taking a history and physical exam. An X-ray, CT scan, or MRI may be needed to determine if there were any associated injuries, such as fractures. TREATMENT  Specific treatment will depend on what area of the body was injured. In general, the best treatment for a contusion is resting, icing, elevating, and applying cold compresses to the injured area. Over-the-counter medicines may also be recommended for pain control. Ask your caregiver what the best treatment is for your contusion. HOME CARE INSTRUCTIONS   Put ice on the injured area.   Put ice in a plastic bag.   Place a towel between your skin and the bag.   Leave the ice on for 15 to 20 minutes, 3 to 4 times a day.  Only take over-the-counter or prescription medicines for pain, discomfort, or fever as directed by your caregiver. Your caregiver may recommend avoiding anti-inflammatory medicines (aspirin, ibuprofen, and naproxen) for 48 hours because these medicines may increase bruising.   Rest the injured area.   If possible, elevate the injured area to reduce swelling.  SEEK IMMEDIATE MEDICAL CARE IF:   You have increased bruising or swelling.   You have pain that is getting worse.   Your swelling or pain is not relieved with medicines.  MAKE  SURE YOU:   Understand these instructions.   Will watch your condition.   Will get help right away if you are not doing well or get worse.  Document Released: 12/18/2004 Document Revised: 02/27/2011 Document Reviewed: 01/13/2011 [facility] Patient Information 2012 [place], [place].Contusion A contusion is a deep bruise. Contusions are the result of an injury that caused bleeding under the skin. The contusion may turn blue, purple, or yellow. Minor injuries will give you a painless contusion, but more severe contusions may stay painful and swollen for a few weeks.  CAUSES  A contusion is usually caused by a blow, trauma, or direct force to an area of the body. SYMPTOMS   Swelling and redness of the injured area.   Bruising of the injured area.   Tenderness and soreness of the injured area.   Pain.  DIAGNOSIS  The diagnosis can be made by taking a history and physical exam. An X-ray, CT scan, or MRI may be needed to determine if there were any associated injuries, such as fractures. TREATMENT  Specific treatment will depend on what area of the body was injured. In general, the best treatment for a contusion is resting, icing, elevating, and applying cold compresses to the injured area. Over-the-counter medicines may also be recommended for pain control. Ask your caregiver what the best treatment is for your contusion. HOME CARE INSTRUCTIONS   Put ice on the injured area.   Put ice in a plastic bag.   Place a towel between your skin and the bag.   Leave the ice on for 15 to 20 minutes, 3 to 4 times a day.   Only take over-the-counter or prescription medicines for pain, discomfort, or fever as directed by your caregiver. Your caregiver may recommend avoiding anti-inflammatory medicines (aspirin, ibuprofen, and naproxen) for 48 hours because these medicines may increase bruising.   Rest the injured area.   If possible, elevate the injured area to reduce swelling.  SEEK IMMEDIATE  MEDICAL CARE IF:   You have increased bruising or swelling.   You have pain that is getting worse.   Your swelling or pain is not relieved with medicines.  MAKE SURE YOU:   Understand these instructions.   Will watch your condition.   Will get help right away if you are not doing well or get worse.  Document Released: 12/18/2004 Document Revised: 02/27/2011 Document Reviewed: 01/13/2011 [facility] Patient Information 2012 [place], [place].

## 2011-12-10 NOTE — ED Provider Notes (Signed)
History     CSN: [id number]  Arrival date & time 12/09/11  2213   First MD Initiated Contact with Patient 12/10/11 0243      Chief Complaint  Patient presents with  . Laceration    (Consider location/radiation/quality/duration/timing/severity/associated sxs/prior treatment) HPI Comments: Patient states he was carving.  His daughter's name into a piece of wood when the, carving 12 slipped lacerating his left forearm through a previously.  Scratched area by his pitbull puppy that occurred earlier in the day Patient states he also fell earlier in the day.  Jamming his right middle finger, which is now swollen in the DIP joint PIP joints  Patient is a 31 y.o. male presenting with skin laceration. The history is provided by the patient.  Laceration  The incident occurred 3 to 5 hours ago. The laceration is located on the left arm. The laceration is 5 cm in size. The laceration mechanism was a a razor. The pain is at a severity of 5/10. The pain is mild. The pain has been constant since onset. His tetanus status is out of date.    Past Medical History  Diagnosis Date  . Nerve pain   . Crush injury lower leg     History reviewed. No pertinent past surgical history.  History reviewed. No pertinent family history.  History  Substance Use Topics  . Smoking status: Current Every Day Smoker  . Smokeless tobacco: Not on file  . Alcohol Use: No      Review of Systems  Constitutional: Negative for fever.  Musculoskeletal: Positive for joint swelling.  Skin: Positive for wound.    Allergies  Aspirin; Codeine; Penicillins; and Tramadol  Home Medications   Current Outpatient Rx  Name Route Sig Dispense Refill  . BENZOCAINE 10 % MT GEL Mouth/Throat Use as directed 1 application in the mouth or throat 3 (three) times daily as needed. For pain    . GABAPENTIN 300 MG PO CAPS Oral Take 300 mg by mouth 3 (three) times daily.    . IBUPROFEN 200 MG PO TABS Oral Take 800 mg by mouth  every 6 (six) hours as needed. pain    . LAMOTRIGINE 100 MG PO TABS Oral Take 100 mg by mouth daily.    Marland Kitchen LIDOCAINE 5 % EX PTCH Transdermal Place 1 patch onto the skin daily. APPLY TO UPPER BACK .Remove & Discard patch within 12 hours or as directed by MD    . METOPROLOL TARTRATE 25 MG PO TABS Oral Take 25 mg by mouth daily.    Marland Kitchen NAPROXEN 500 MG PO TABS Oral Take 500 mg by mouth as needed. For pain    . NORTRIPTYLINE HCL 75 MG PO CAPS Oral Take 75 mg by mouth at bedtime.    Marland Kitchen QUETIAPINE FUMARATE 200 MG PO TABS Oral Take 200 mg by mouth at bedtime.      BP 125/85  Pulse 103  Temp 97.9 F (36.6 C) (Oral)  Resp 18  Wt 213 lb (96.616 kg)  SpO2 96%  Physical Exam  Constitutional: He is oriented to person, place, and time. He appears well-developed and well-nourished.  HENT:  Head: Normocephalic.  Eyes: Pupils are equal, round, and reactive to light.  Neck: Normal range of motion.  Cardiovascular: Normal rate.   Pulmonary/Chest: Effort normal.  Musculoskeletal: Normal range of motion.       Swelling of the DIP joint PIP joint, right middle finger.  Without any breaks in the skin  Neurological: He is  alert and oriented to person, place, and time.  Skin: Skin is warm. No erythema.       Patient has several superficial abrasions in a linear pattern from upper dorsal aspect of the left forearm, down to just above the wrist, and then a deep 5 cm laceration transverse to that on the dorsal aspect of the left forearm    ED Course  LACERATION REPAIR Date/Time: 12/10/2011 3:25 AM Performed by: Arman Filter Authorized by: Arman Filter Consent: Verbal consent obtained. Risks and benefits: risks, benefits and alternatives were discussed Consent given by: patient Patient understanding: patient states understanding of the procedure being performed Patient identity confirmed: verbally with patient Time out: Immediately prior to procedure a "time out" was called to verify the correct patient,  procedure, equipment, support staff and site/side marked as required. Body area: upper extremity Laceration length: 5 cm Foreign bodies: metal Tendon involvement: none Nerve involvement: none Vascular damage: no Anesthesia: local infiltration Local anesthetic: lidocaine 1% without epinephrine Anesthetic total: 4 ml Patient sedated: no Preparation: Patient was prepped and draped in the usual sterile fashion. Irrigation solution: saline Irrigation method: syringe Amount of cleaning: extensive Debridement: none Skin closure: staples Number of sutures: 6 Approximation: close Approximation difficulty: simple Dressing: antibiotic ointment Patient tolerance: Patient tolerated the procedure well with no immediate complications.   (including critical care time)  Labs Reviewed - No data to display Dg Finger Middle Right  12/10/2011  *RADIOLOGY REPORT*  Clinical Data: Injury 3 days ago.  Pain and swelling.  RIGHT MIDDLE FINGER 2+V  Comparison: Plain films 11/12/2006.  Findings: Soft tissue swelling is present about the long finger. No fracture, dislocation or radiopaque foreign body.  IMPRESSION: Soft tissue swelling without underlying bony or joint abnormality.   Original Report Authenticated By: Bernadene Bell. D'ALESSIO, M.D.      1. Laceration   2. Abrasion   3. Finger contusion       MDM  Laceration repaired with 7 staples.  Abrasions were cleaned with Betadine.  Bacitracin was applied to all extra reviewed.  Of right middle finger, revealing no fracture.  Patient came in with a splint in place of encouraged him to keep the splint in place for the next several days        Arman Filter, NP 12/10/11 0330  Arman Filter, NP 12/10/11 [phone]

## 2012-03-29 ENCOUNTER — Encounter: Payer: Self-pay | Admitting: Internal Medicine

## 2012-03-29 ENCOUNTER — Ambulatory Visit (INDEPENDENT_AMBULATORY_CARE_PROVIDER_SITE_OTHER): Payer: 59 | Admitting: Emergency Medicine

## 2012-03-29 ENCOUNTER — Ambulatory Visit (INDEPENDENT_AMBULATORY_CARE_PROVIDER_SITE_OTHER): Payer: Commercial Managed Care - PPO | Admitting: Internal Medicine

## 2012-03-29 VITALS — BP 110/78 | HR 86 | Temp 97.9°F | Resp 18 | Ht 69.5 in | Wt 208.0 lb

## 2012-03-29 VITALS — BP 124/82 | HR 88 | Temp 98.7°F | Ht 70.0 in | Wt 210.0 lb

## 2012-03-29 DIAGNOSIS — K529 Noninfective gastroenteritis and colitis, unspecified: Secondary | ICD-10-CM | POA: Insufficient documentation

## 2012-03-29 DIAGNOSIS — G8929 Other chronic pain: Secondary | ICD-10-CM

## 2012-03-29 DIAGNOSIS — F319 Bipolar disorder, unspecified: Secondary | ICD-10-CM

## 2012-03-29 DIAGNOSIS — R197 Diarrhea, unspecified: Secondary | ICD-10-CM

## 2012-03-29 DIAGNOSIS — Z800 Family history of malignant neoplasm of digestive organs: Secondary | ICD-10-CM

## 2012-03-29 LAB — CBC WITH DIFFERENTIAL/PLATELET
Basophils Relative: 0.5 % (ref 0.0–3.0)
Eosinophils Absolute: 0.2 10*3/uL (ref 0.0–0.7)
Eosinophils Relative: 3 % (ref 0.0–5.0)
Lymphocytes Relative: 28.8 % (ref 12.0–46.0)
MCHC: 33.5 g/dL (ref 30.0–36.0)
Monocytes Absolute: 0.5 10*3/uL (ref 0.1–1.0)
Neutrophils Relative %: 61.2 % (ref 43.0–77.0)
Platelets: 249 10*3/uL (ref 150.0–400.0)
RBC: 5.31 Mil/uL (ref 4.22–5.81)
WBC: 7.1 10*3/uL (ref 4.5–10.5)

## 2012-03-29 LAB — LIPID PANEL
HDL: 56 mg/dL (ref >39.00)
LDL Cholesterol: 94 mg/dL (ref 0–99)
Total CHOL/HDL Ratio: 3
Triglycerides: 173 mg/dL — ABNORMAL HIGH (ref 0.0–149.0)

## 2012-03-29 LAB — HEPATIC FUNCTION PANEL
Alkaline Phosphatase: 68 U/L (ref 39–117)
Bilirubin, Direct: 0.1 mg/dL (ref 0.0–0.3)
Total Bilirubin: 0.5 mg/dL (ref 0.3–1.2)
Total Protein: 8 g/dL (ref 6.0–8.3)

## 2012-03-29 LAB — BASIC METABOLIC PANEL
BUN: 17 mg/dL (ref 6–23)
Calcium: 9.8 mg/dL (ref 8.4–10.5)
Creatinine, Ser: 1 mg/dL (ref 0.4–1.5)

## 2012-03-29 LAB — SEDIMENTATION RATE: Sed Rate: 7 mm/hr (ref 0–22)

## 2012-03-29 MED ORDER — LAMOTRIGINE 50 MG PO TBDP: 1.0000 | ORAL_TABLET | Freq: Every day | ORAL | Status: DC

## 2012-03-29 MED ORDER — NORTRIPTYLINE HCL 75 MG PO CAPS: 75.0000 mg | ORAL_CAPSULE | Freq: Every day | ORAL | Status: DC

## 2012-03-29 MED ORDER — GABAPENTIN 300 MG PO CAPS: 300.0000 mg | ORAL_CAPSULE | Freq: Three times a day (TID) | ORAL | Status: DC

## 2012-03-29 NOTE — Progress Notes (Signed)
Subjective:    Patient ID: Shane Cole., male    DOB: Oct 21, 1980, 32 y.o.   MRN: [id number]  HPI  32 year old white male with history of bipolar disorder to establish. Patient has not had a primary care physician in the past. Patient has had issues with depression since 2007 but was recently diagnosed with bipolar disorder one year ago. He is followed by local psychiatrist. Patient still working with a psychiatrist to find optimal medication regimen. Patient was previously on Seroquel for 1 to 2 months. He had to discontinue due to tremors. Patient reports gaining significant amount of weight while he was taking Seroquel. His baseline weight is 160-170 pounds. He is at his current weight of 210 pounds.  Patient has history of musculoskeletal injury to left lower leg. He worked as a Radio broadcast assistant. Unfortunately he suffered an accident where his left ankle lower leg was crushed by a falling granite.  Patient previously worked with pain management specialist and was on chronic narcotics. He has been able to taper off. While he was on narcotics, patient had chronic constipation.  He complains of gastrointestinal upset. He now has chronic loose stools/diarrhea. He also has lower abdominal pain and cramping sensation. He denies family history of inflammatory bowel disease.   Review of Systems  Constitutional: Negative for activity change. Positive weight gain Eyes: Negative for visual disturbance.  Respiratory: Negative for cough, chest tightness and shortness of breath.   Cardiovascular: Negative for chest pain.  Genitourinary: Negative for difficulty urinating.  Neurological: Negative for headaches.  Gastrointestinal: Positive for abdominal pain, he denies hematochezia Psych: Negative for depression or anxiety, chronic insomnia ID:  No hx of STDs  Past Medical History  Diagnosis Date  . Nerve pain   . Crush injury lower leg     Left lower leg  . Depression   . Migraine   .  Bipolar disorder     History   Social History  . Marital Status: Married    Spouse Name: N/A    Number of Children: N/A  . Years of Education: N/A   Occupational History  . Disabled     Previously worked Web designer   Social History Main Topics  . Smoking status: Former Games developer  . Smokeless tobacco: Not on file  . Alcohol Use: No  . Drug Use: No  . Sexually Active: Not on file   Other Topics Concern  . Not on file   Social History Narrative   Married for 6 yearsHe has 16-year-old daughter.He grew up in [place], [place]. He is living in [place] for last 11-12 years.    History reviewed. No pertinent past surgical history.  Family History  Problem Relation Age of Onset  . Hyperlipidemia Father   . Cancer Paternal Grandfather     lung, colon  . Stroke    . Heart disease    . Hypertension Father   . Diabetes Paternal Grandmother     Allergies  Allergen Reactions  . Aspirin     Tinnitus, dizzy, short of breath  . Codeine Itching  . Penicillins Anaphylaxis  . Tramadol     seizure    Current Outpatient Prescriptions on File Prior to Visit  Medication Sig Dispense Refill  . benzocaine (ORAJEL) 10 % mucosal gel Use as directed 1 application in the mouth or throat 3 (three) times daily as needed. For pain      . gabapentin (NEURONTIN) 300 MG capsule Take 300 mg  by mouth 3 (three) times daily.      Marland Kitchen ibuprofen (ADVIL,MOTRIN) 200 MG tablet Take 800 mg by mouth every 6 (six) hours as needed. pain      . metoprolol tartrate (LOPRESSOR) 25 MG tablet Take 25 mg by mouth daily.      Marland Kitchen zolpidem (AMBIEN) 10 MG tablet Take 10 mg by mouth at bedtime as needed.        BP 124/82  Pulse 88  Temp 98.7 F (37.1 C) (Oral)  Ht 5\' 10"  (1.778 m)  Wt 210 lb (95.255 kg)  BMI 30.13 kg/m2        Objective:   Physical Exam  Constitutional: He is oriented to person, place, and time. He appears well-developed and well-nourished.  HENT:  Head:  Normocephalic and atraumatic.  Right Ear: External ear normal.  Left Ear: External ear normal.       Poor dentition  Eyes: EOM are normal. Pupils are equal, round, and reactive to light. No scleral icterus.  Neck: Neck supple. No thyromegaly present.       No carotid bruit  Cardiovascular: Normal rate, regular rhythm and normal heart sounds.   No murmur heard. Pulmonary/Chest: Effort normal and breath sounds normal. He has no wheezes.  Abdominal: Soft. Bowel sounds are normal.       Lower abdominal tenderness  Genitourinary: Penis normal. Guaiac positive stool.       External hemorrhoid  Lymphadenopathy:    He has no cervical adenopathy.  Neurological: He is oriented to person, place, and time. No cranial nerve deficit.  Skin: Skin is warm and dry.  Psychiatric: He has a normal mood and affect. His behavior is normal.          Assessment & Plan:

## 2012-03-29 NOTE — Progress Notes (Signed)
[facility] [address] [place] [address] [phone]  Date:  03/29/2012   Name:  Shane Cole.   DOB:  Sep 26, 1980   MRN:  [id number]  PCP:  Thomos Lemons, DO    Chief Complaint: Foot Injury   History of Present Illness:  Shane Rieth. is a 32 y.o. 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  He claims RSD and hypersensitivity in the foot. Was in pain management until April at which time he could no longer pay the bills out of pocket for the care.  He was seen at that time in the Urgent care and requested pain medications.  He has been subsisting on OTC pain medication for the past eight months.  Is requesting a referral to pain management.  Patient Active Problem List  Diagnosis  . Chronic diarrhea  . Bipolar disorder    Past Medical History  Diagnosis Date  . Nerve pain   . Crush injury lower leg     Left lower leg  . Depression   . Migraine   . Bipolar disorder   . Allergy   . Anxiety     No past surgical history on file.  History  Substance Use Topics  . Smoking status: Former Games developer  . Smokeless tobacco: Not on file  . Alcohol Use: No    Family History  Problem Relation Age of Onset  . Hyperlipidemia Father   . Hypertension Father   . Cancer Paternal Grandfather     lung, colon  . Stroke    . Heart disease    . Diabetes Paternal Grandmother   . Cancer Paternal Grandmother   . Cancer Maternal Grandmother   . Cancer Maternal Grandfather     Allergies  Allergen Reactions  . Aspirin     Tinnitus, dizzy, short of breath  . Codeine Itching  . Penicillins Anaphylaxis  . Tramadol     seizure    Medication list has been reviewed and updated.  Current Outpatient Prescriptions on File Prior to Visit  Medication Sig Dispense Refill  . benzocaine (ORAJEL) 10 % mucosal gel Use as directed 1 application in  the mouth or throat 3 (three) times daily as needed. For pain      . gabapentin (NEURONTIN) 300 MG capsule Take 300 mg by mouth 3 (three) times daily.      Marland Kitchen ibuprofen (ADVIL,MOTRIN) 200 MG tablet Take 800 mg by mouth every 6 (six) hours as needed. pain      . LamoTRIgine 50 MG TBDP Take 1 tablet (50 mg total) by mouth daily.  30 tablet  1  . metoprolol tartrate (LOPRESSOR) 25 MG tablet Take 25 mg by mouth daily.      . naproxen (NAPROSYN) 500 MG tablet Take 500 mg by mouth 2 (two) times daily with a meal.      . nortriptyline (PAMELOR) 25 MG capsule Take 25 mg by mouth at bedtime.      Marland Kitchen zolpidem (AMBIEN) 10 MG tablet Take 10 mg by mouth at bedtime as needed.        Review of Systems:  As per HPI, otherwise negative.    Physical Examination: Filed Vitals:   03/29/12 1842  BP: 110/78  Pulse: 86  Temp: 97.9 F (36.6 C)  Resp: 18   Filed Vitals:   03/29/12 1842  Height: 5' 9.5" (1.765 m)  Weight: 208 lb (94.348 kg)   Body mass index is 30.28 kg/(m^2). Ideal Body Weight: Weight in (lb) to have BMI = 25: 171.4    GEN: WDWN, NAD, Non-toxic, Alert & Oriented x 3 HEENT: Atraumatic, Normocephalic.  Ears and Nose: No external deformity. EXTR: No clubbing/cyanosis/edema NEURO: Normal gait.  PSYCH: Normally interactive. Conversant. Not depressed or anxious appearing.  Calm demeanor.  Left foot:  Hyperpigmented. No deformity.  Assessment and Plan: Chronic pain Refer preferred pain management Continue neurontin and nortriptyline   Carmelina Dane, MD

## 2012-03-29 NOTE — Assessment & Plan Note (Signed)
Patient complains of chronic loose stools for  months. He also has lower abdominal tenderness. Symptoms may be secondary IBS. Rule out inflammatory bowel disease. Refer to GI for further evaluation. Obtain stool studies, CBC, LFTs and sedimentation rate.  He is heme positive.

## 2012-03-29 NOTE — Assessment & Plan Note (Signed)
Patient encouraged to followup with psychiatrist. Seroquel discontinued secondary to tremors. He also experienced significant weight gain. We discussed risk of diabetes. Check screening labs.

## 2012-03-30 ENCOUNTER — Encounter: Payer: Self-pay | Admitting: Internal Medicine

## 2012-03-30 LAB — HIV ANTIBODY (ROUTINE TESTING W REFLEX): HIV: NONREACTIVE

## 2012-03-30 NOTE — Progress Notes (Signed)
Results for orders placed in visit on 03/29/12  BASIC METABOLIC PANEL      Component Value Range   Sodium 140  135 - 145 mEq/L   Potassium 4.3  3.5 - 5.1 mEq/L   Chloride 102  96 - 112 mEq/L   CO2 29  19 - 32 mEq/L   Glucose, Bld 98  70 - 99 mg/dL   BUN 17  6 - 23 mg/dL   Creatinine, Ser 1.0  0.4 - 1.5 mg/dL   Calcium 9.8  8.4 - 16.1 mg/dL   GFR 09.60  >45.40 mL/min  CBC WITH DIFFERENTIAL      Component Value Range   WBC 7.1  4.5 - 10.5 K/uL   RBC 5.31  4.22 - 5.81 Mil/uL   Hemoglobin 15.7  13.0 - 17.0 g/dL   HCT 98.1  19.1 - 47.8 %   MCV 88.3  78.0 - 100.0 fl   MCHC 33.5  30.0 - 36.0 g/dL   RDW 29.5  62.1 - 30.8 %   Platelets 249.0  150.0 - 400.0 K/uL   Neutrophils Relative 61.2  43.0 - 77.0 %   Lymphocytes Relative 28.8  12.0 - 46.0 %   Monocytes Relative 6.5  3.0 - 12.0 %   Eosinophils Relative 3.0  0.0 - 5.0 %   Basophils Relative 0.5  0.0 - 3.0 %   Neutro Abs 4.3  1.4 - 7.7 K/uL   Lymphs Abs 2.0  0.7 - 4.0 K/uL   Monocytes Absolute 0.5  0.1 - 1.0 K/uL   Eosinophils Absolute 0.2  0.0 - 0.7 K/uL   Basophils Absolute 0.0  0.0 - 0.1 K/uL  HEPATIC FUNCTION PANEL      Component Value Range   Total Bilirubin 0.5  0.3 - 1.2 mg/dL   Bilirubin, Direct 0.1  0.0 - 0.3 mg/dL   Alkaline Phosphatase 68  39 - 117 U/L   AST 18  0 - 37 U/L   ALT 28  0 - 53 U/L   Total Protein 8.0  6.0 - 8.3 g/dL   Albumin 4.6  3.5 - 5.2 g/dL  LIPID PANEL      Component Value Range   Cholesterol 185  0 - 200 mg/dL   Triglycerides 657.8 (*) 0.0 - 149.0 mg/dL   HDL 46.96  >29.52 mg/dL   VLDL 84.1  0.0 - 32.4 mg/dL   LDL Cholesterol 94  0 - 99 mg/dL   Total CHOL/HDL Ratio 3    SEDIMENTATION RATE      Component Value Range   Sed Rate 7  0 - 22 mm/hr  TSH      Component Value Range   TSH 0.72  0.35 - 5.50 uIU/mL  T4, FREE      Component Value Range   Free T4 0.79  0.60 - 1.60 ng/dL  HIV ANTIBODY (ROUTINE TESTING)      Component Value Range   HIV NON REACTIVE  NON REACTIVE  Reviewed and  agree.

## 2012-04-08 ENCOUNTER — Encounter: Payer: Self-pay | Admitting: Internal Medicine

## 2012-04-12 ENCOUNTER — Encounter: Payer: Self-pay | Admitting: Internal Medicine

## 2012-04-13 ENCOUNTER — Ambulatory Visit: Payer: Commercial Managed Care - PPO | Admitting: Internal Medicine

## 2012-04-17 ENCOUNTER — Encounter (HOSPITAL_COMMUNITY): Payer: Self-pay | Admitting: Emergency Medicine

## 2012-04-17 ENCOUNTER — Emergency Department (HOSPITAL_COMMUNITY)
Admission: EM | Admit: 2012-04-17 | Discharge: 2012-04-17 | Disposition: A | Discharge status: Home / Self Care | Payer: Commercial Managed Care - PPO | Source: Home / Self Care | Attending: Family Medicine | Admitting: Family Medicine

## 2012-04-17 DIAGNOSIS — G4700 Insomnia, unspecified: Secondary | ICD-10-CM

## 2012-04-17 MED ORDER — LIDOCAINE VISCOUS 2 % MT SOLN: OROMUCOSAL | Status: DC

## 2012-04-17 MED ORDER — ZOLPIDEM TARTRATE 10 MG PO TABS: 10.0000 mg | ORAL_TABLET | Freq: Every evening | ORAL | Status: DC | PRN

## 2012-04-17 NOTE — ED Provider Notes (Signed)
History     CSN: [id number]  Arrival date & time 04/17/12  1226   First MD Initiated Contact with Patient 04/17/12 1314      Chief Complaint  Patient presents with  . Insomnia    (Consider location/radiation/quality/duration/timing/severity/associated sxs/prior treatment) HPI Comments: Pt complains of difficulty sleeping.   Pt reports he is only sleeping 2-3 hours a night.   Pt has taken ambien in the past with some relief.   Pt will see his MD next week.   Pt also ask for viscous zylocaine for tooth extraction site due to pain.   The history is provided by the patient. No language interpreter was used.    Past Medical History  Diagnosis Date  . Nerve pain   . Crush injury lower leg     Left lower leg  . Depression   . Migraine   . Bipolar disorder   . Allergy   . Anxiety     History reviewed. No pertinent past surgical history.  Family History  Problem Relation Age of Onset  . Hyperlipidemia Father   . Hypertension Father   . Cancer Paternal Grandfather     lung, colon  . Stroke    . Heart disease    . Diabetes Paternal Grandmother   . Cancer Paternal Grandmother   . Cancer Maternal Grandmother   . Cancer Maternal Grandfather     History  Substance Use Topics  . Smoking status: Former Games developer  . Smokeless tobacco: Not on file  . Alcohol Use: No      Review of Systems  All other systems reviewed and are negative.    Allergies  Aspirin; Codeine; Penicillins; and Tramadol  Home Medications   Current Outpatient Rx  Name  Route  Sig  Dispense  Refill  . GABAPENTIN 300 MG PO CAPS   Oral   Take 300 mg by mouth 3 (three) times daily.         Marland Kitchen NORTRIPTYLINE HCL 75 MG PO CAPS   Oral   Take 1 capsule (75 mg total) by mouth at bedtime.   30 capsule   3   . BENZOCAINE 10 % MT GEL   Mouth/Throat   Use as directed 1 application in the mouth or throat 3 (three) times daily as needed. For pain         . GABAPENTIN 300 MG PO CAPS   Oral   Take 1  capsule (300 mg total) by mouth 3 (three) times daily.   90 capsule   3   . IBUPROFEN 200 MG PO TABS   Oral   Take 800 mg by mouth every 6 (six) hours as needed. pain         . LAMOTRIGINE 50 MG PO TBDP   Oral   Take 1 tablet (50 mg total) by mouth daily.   30 tablet   1   . LIDOCAINE VISCOUS 2 % MT SOLN      Apply to affected area with a qtip prn pain   20 mL   0   . METOPROLOL TARTRATE 25 MG PO TABS   Oral   Take 25 mg by mouth daily.         Marland Kitchen NAPROXEN 500 MG PO TABS   Oral   Take 500 mg by mouth 2 (two) times daily with a meal.         . NORTRIPTYLINE HCL 25 MG PO CAPS   Oral   Take 25 mg  by mouth at bedtime.         Marland Kitchen ZOLPIDEM TARTRATE 10 MG PO TABS   Oral   Take 1 tablet (10 mg total) by mouth at bedtime as needed.   7 tablet   0     BP 128/97  Pulse 107  Temp 97.7 F (36.5 C) (Oral)  SpO2 98%  Physical Exam  Vitals reviewed. Constitutional: He appears well-developed.  HENT:  Head: Normocephalic.       Healing area right upper gum  Cardiovascular: Normal rate.   Pulmonary/Chest: Effort normal.  Neurological: He is alert.  Skin: Skin is warm.    ED Course  Procedures (including critical care time)  Labs Reviewed - No data to display No results found.   1. Insomnia       MDM  ambien  7 tablets,   Viscous xylocaine        Lonia Skinner Hardy, [place] 04/17/12 1420

## 2012-04-17 NOTE — ED Notes (Signed)
Pt c/o insomnia x5 years Last 2 weeks have been getting worse Was seeing someone at [facility] and given Ambien but states it's hard to get in with them Will get between 2-3 hours of sleep per night  He is alert w/no signs of acute distress.

## 2012-04-19 NOTE — ED Provider Notes (Signed)
Medical screening examination/treatment/procedure(s) were performed by resident physician or non-physician practitioner and as supervising physician I was immediately available for consultation/collaboration.   Barkley Bruns MD.    Linna Hoff, MD 04/19/12 [phone]

## 2012-05-18 DIAGNOSIS — M533 Sacrococcygeal disorders, not elsewhere classified: Secondary | ICD-10-CM | POA: Insufficient documentation

## 2012-05-18 DIAGNOSIS — G894 Chronic pain syndrome: Secondary | ICD-10-CM | POA: Insufficient documentation

## 2012-05-18 DIAGNOSIS — G90529 Complex regional pain syndrome I of unspecified lower limb: Secondary | ICD-10-CM | POA: Insufficient documentation

## 2012-05-20 ENCOUNTER — Emergency Department (HOSPITAL_COMMUNITY)
Admission: EM | Admit: 2012-05-20 | Discharge: 2012-05-20 | Disposition: A | Discharge status: Home / Self Care | Payer: 59 | Attending: Emergency Medicine | Admitting: Emergency Medicine

## 2012-05-20 ENCOUNTER — Encounter (HOSPITAL_COMMUNITY): Payer: Self-pay | Admitting: Emergency Medicine

## 2012-05-20 ENCOUNTER — Emergency Department (HOSPITAL_COMMUNITY): Payer: 59

## 2012-05-20 DIAGNOSIS — S8990XA Unspecified injury of unspecified lower leg, initial encounter: Secondary | ICD-10-CM | POA: Insufficient documentation

## 2012-05-20 DIAGNOSIS — R110 Nausea: Secondary | ICD-10-CM | POA: Insufficient documentation

## 2012-05-20 DIAGNOSIS — F411 Generalized anxiety disorder: Secondary | ICD-10-CM | POA: Insufficient documentation

## 2012-05-20 DIAGNOSIS — Z87828 Personal history of other (healed) physical injury and trauma: Secondary | ICD-10-CM | POA: Insufficient documentation

## 2012-05-20 DIAGNOSIS — Y929 Unspecified place or not applicable: Secondary | ICD-10-CM | POA: Insufficient documentation

## 2012-05-20 DIAGNOSIS — J3489 Other specified disorders of nose and nasal sinuses: Secondary | ICD-10-CM | POA: Insufficient documentation

## 2012-05-20 DIAGNOSIS — W010XXA Fall on same level from slipping, tripping and stumbling without subsequent striking against object, initial encounter: Secondary | ICD-10-CM | POA: Insufficient documentation

## 2012-05-20 DIAGNOSIS — R209 Unspecified disturbances of skin sensation: Secondary | ICD-10-CM | POA: Insufficient documentation

## 2012-05-20 DIAGNOSIS — R05 Cough: Secondary | ICD-10-CM | POA: Insufficient documentation

## 2012-05-20 DIAGNOSIS — IMO0002 Reserved for concepts with insufficient information to code with codable children: Secondary | ICD-10-CM | POA: Insufficient documentation

## 2012-05-20 DIAGNOSIS — Y939 Activity, unspecified: Secondary | ICD-10-CM | POA: Insufficient documentation

## 2012-05-20 DIAGNOSIS — Z8669 Personal history of other diseases of the nervous system and sense organs: Secondary | ICD-10-CM | POA: Insufficient documentation

## 2012-05-20 DIAGNOSIS — M533 Sacrococcygeal disorders, not elsewhere classified: Secondary | ICD-10-CM

## 2012-05-20 DIAGNOSIS — Z8781 Personal history of (healed) traumatic fracture: Secondary | ICD-10-CM | POA: Insufficient documentation

## 2012-05-20 DIAGNOSIS — X500XXA Overexertion from strenuous movement or load, initial encounter: Secondary | ICD-10-CM | POA: Insufficient documentation

## 2012-05-20 DIAGNOSIS — Z79899 Other long term (current) drug therapy: Secondary | ICD-10-CM | POA: Insufficient documentation

## 2012-05-20 DIAGNOSIS — R059 Cough, unspecified: Secondary | ICD-10-CM | POA: Insufficient documentation

## 2012-05-20 DIAGNOSIS — F319 Bipolar disorder, unspecified: Secondary | ICD-10-CM | POA: Insufficient documentation

## 2012-05-20 DIAGNOSIS — Z87891 Personal history of nicotine dependence: Secondary | ICD-10-CM | POA: Insufficient documentation

## 2012-05-20 DIAGNOSIS — G43909 Migraine, unspecified, not intractable, without status migrainosus: Secondary | ICD-10-CM | POA: Insufficient documentation

## 2012-05-20 IMAGING — CR DG LUMBAR SPINE COMPLETE 4+V
5 series · 5 of 5 positions shown · non-contrast
Comparison: None

CLINICAL DATA: Fall with low back pain.

LUMBAR SPINE - COMPLETE 4+ VIEW

[t lumbar spine ap]
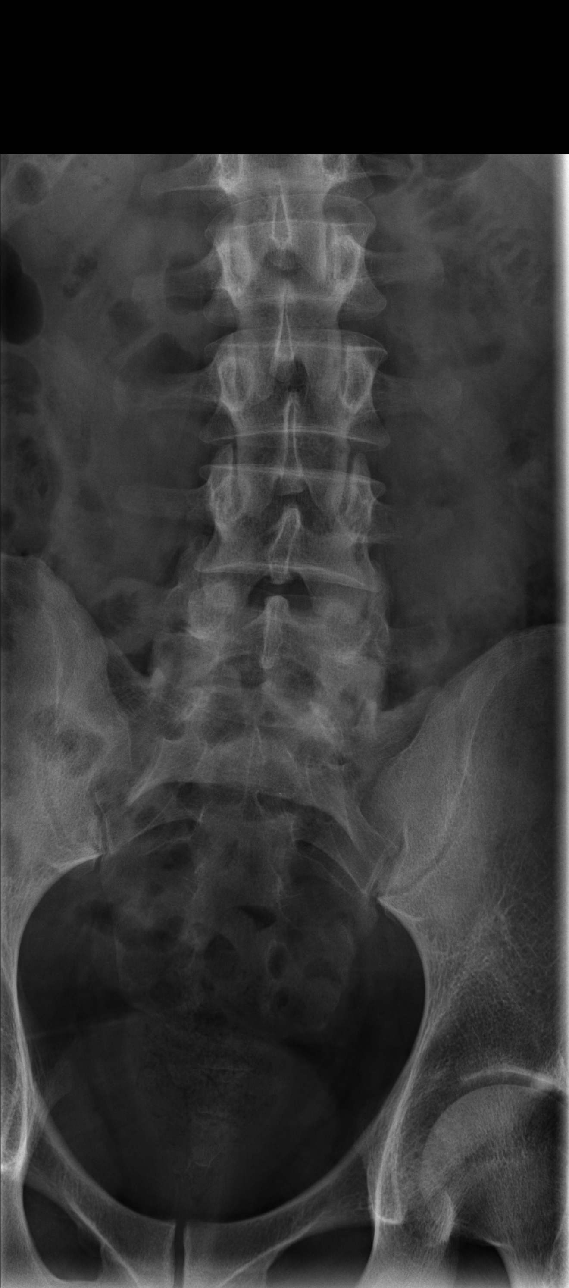

[t lumbar spine obl (1 of 2)]
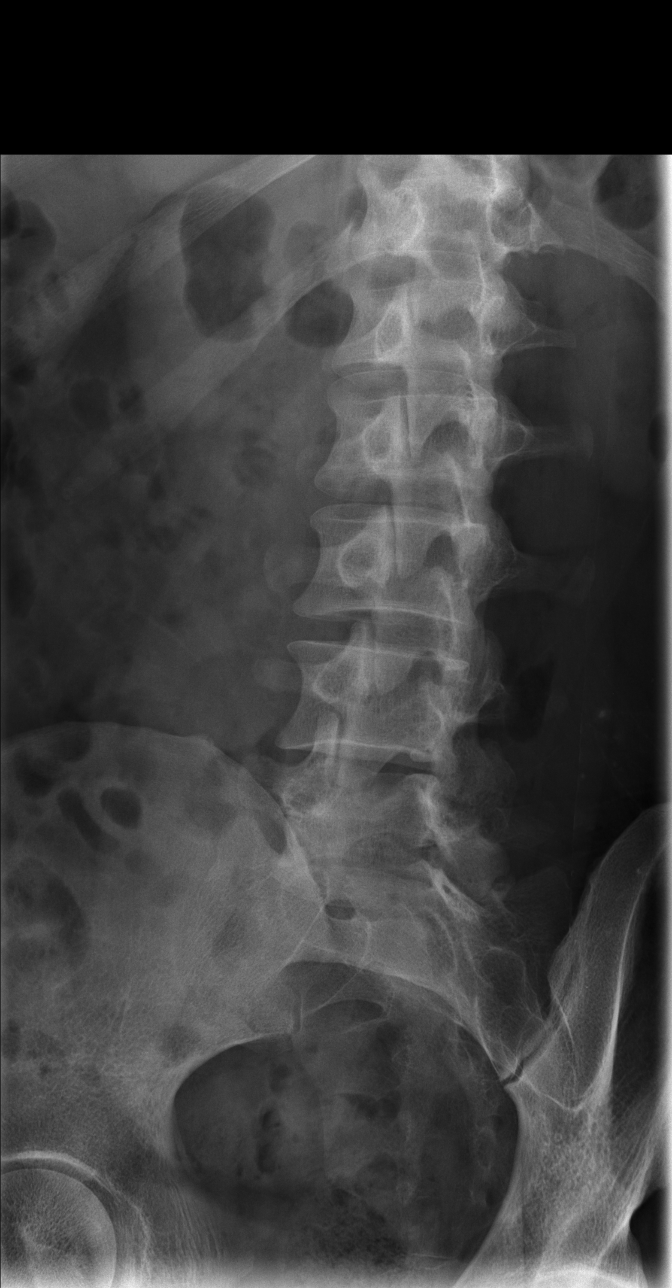

[t lumbar spine obl (2 of 2)]
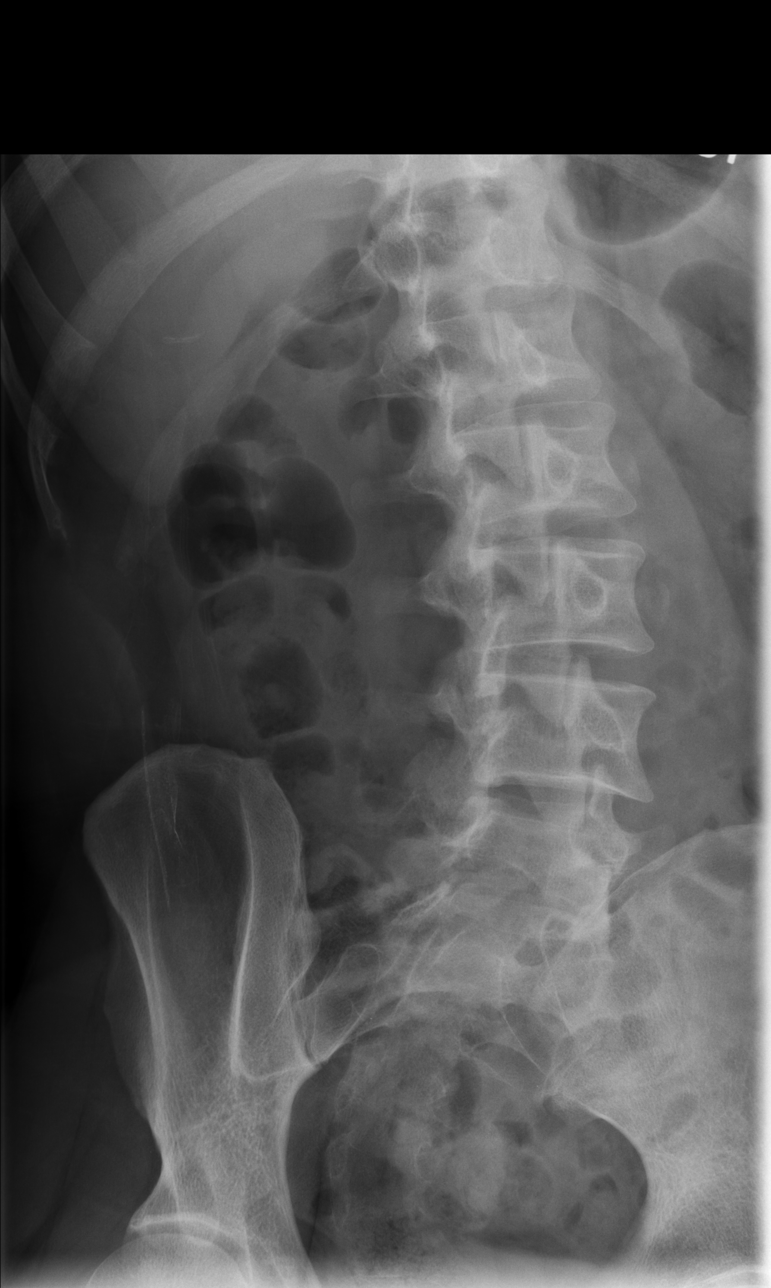

[t lumbar spine lat]
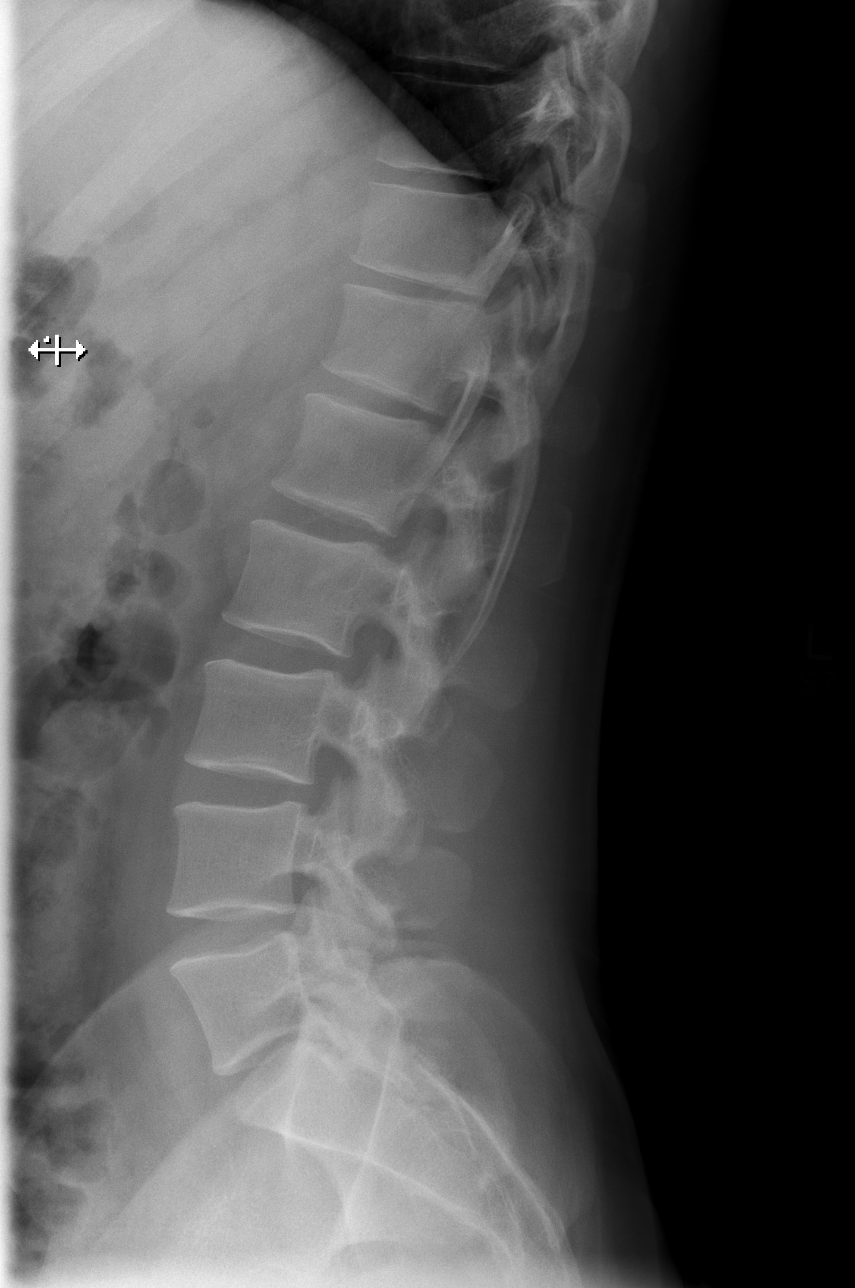

[t lumbar l-5 s-1 spot]
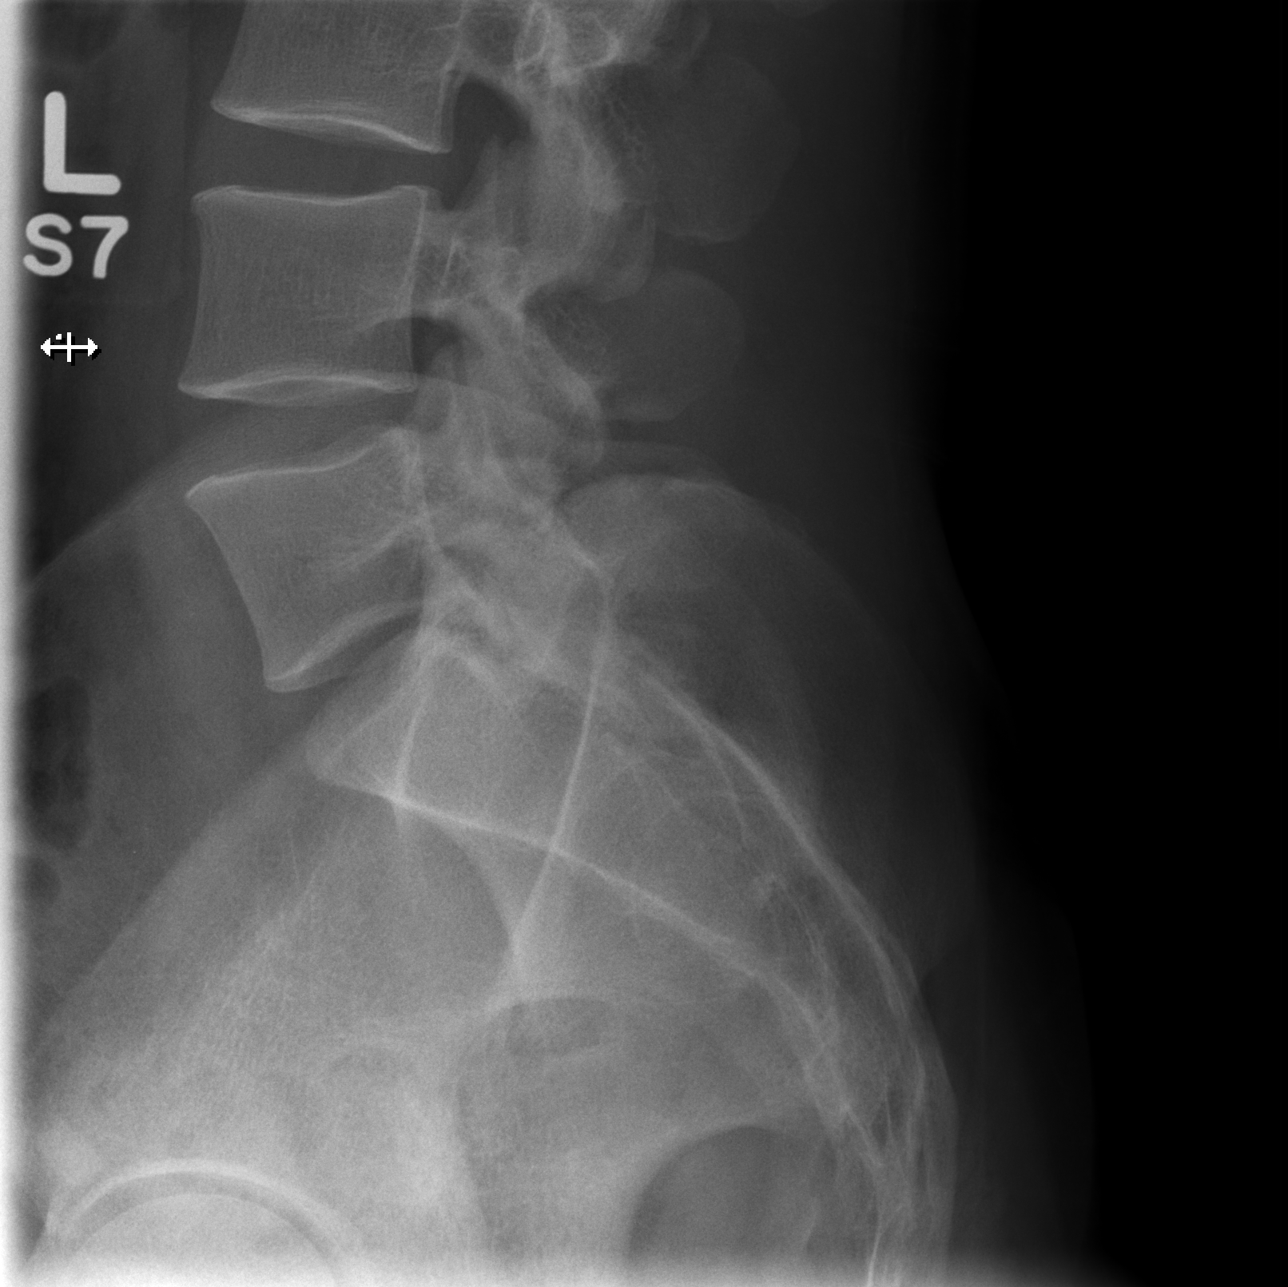

[5 of 5 positions shown; findings below may reference images not displayed]

FINDINGS: Five non-rib bearing lumbar type vertebra are identified
with a very mild mild apex left lumbar scoliosis.
There is no evidence of fracture or subluxation.
The disc spaces are maintained.
No focal bony lesions or spondylolysis identified.
IMPRESSION: Very mild scoliosis without other significant abnormality.

## 2012-05-20 IMAGING — CR DG SACRUM/COCCYX 2+V
3 series · 3 of 3 positions shown · non-contrast
Comparison: None

CLINICAL DATA: Fall with sacrum/coccyx pain.

SACRUM AND COCCYX - 2+ VIEW

[t sacrum ap]
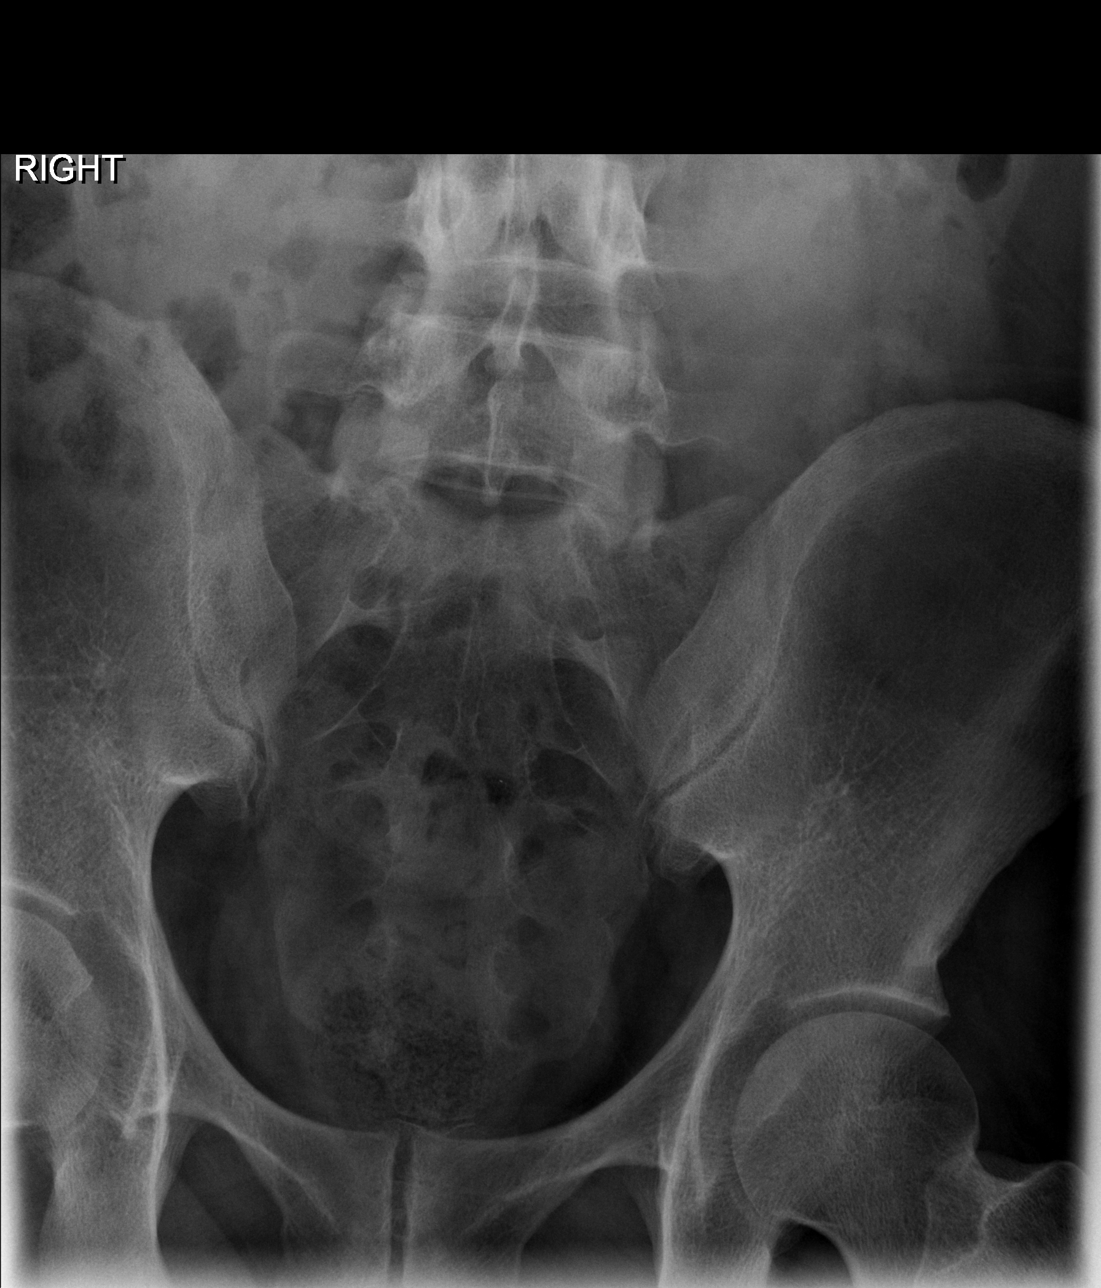

[t coccyx ap]
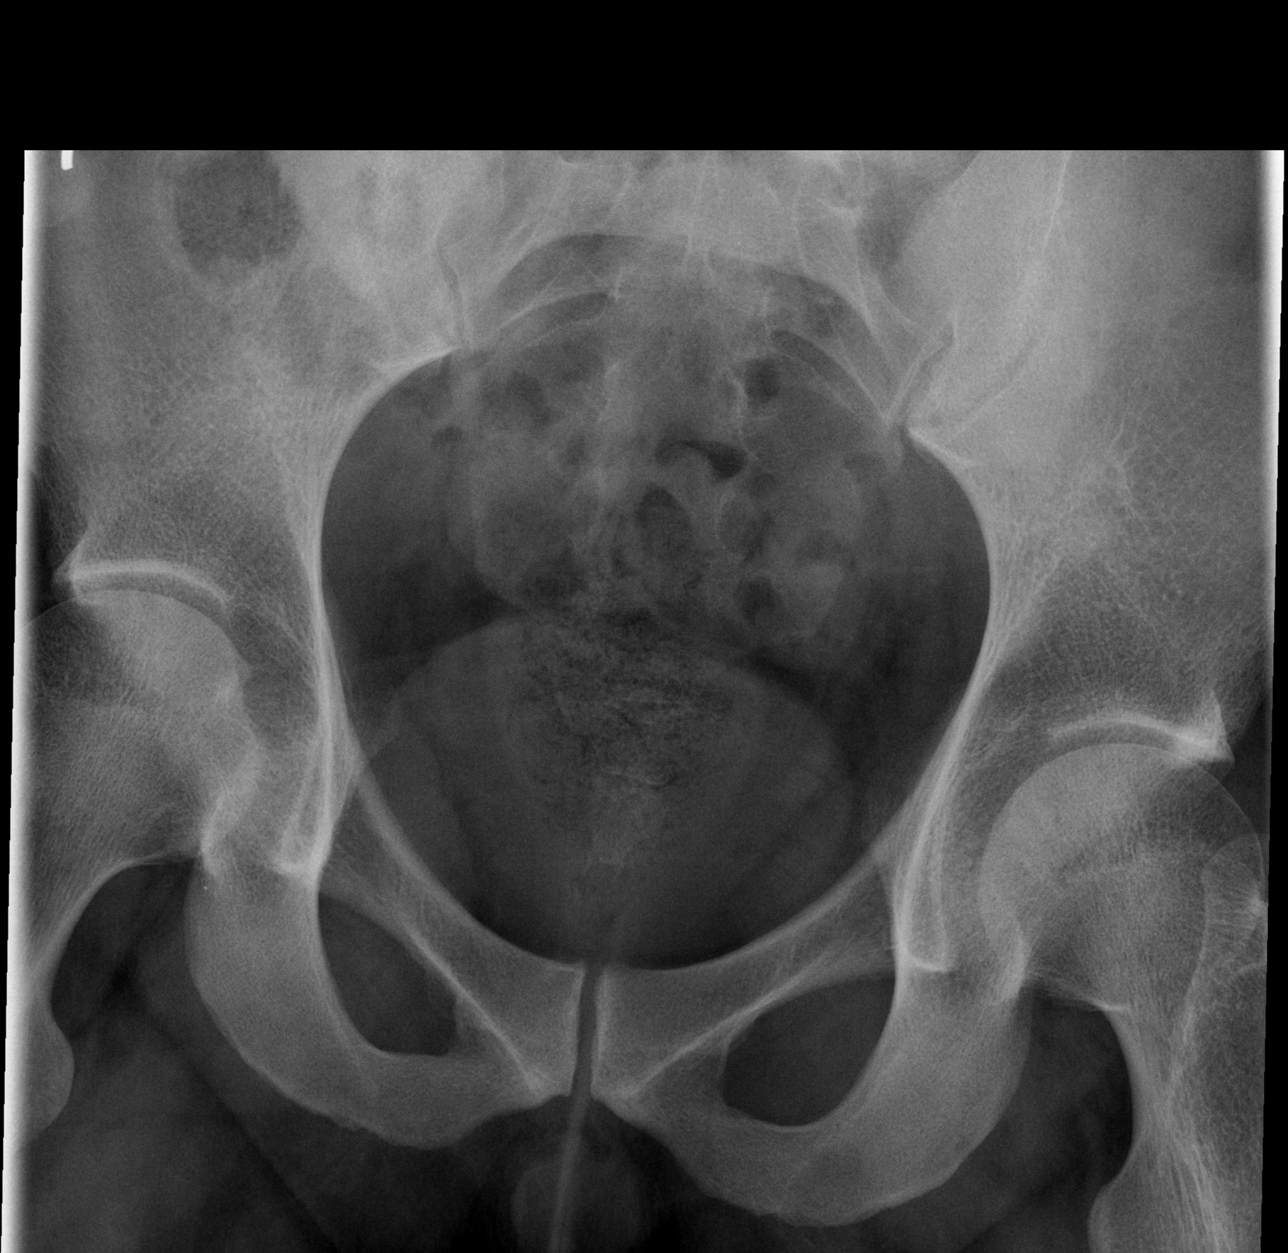

[t sacrum coccyx lat]
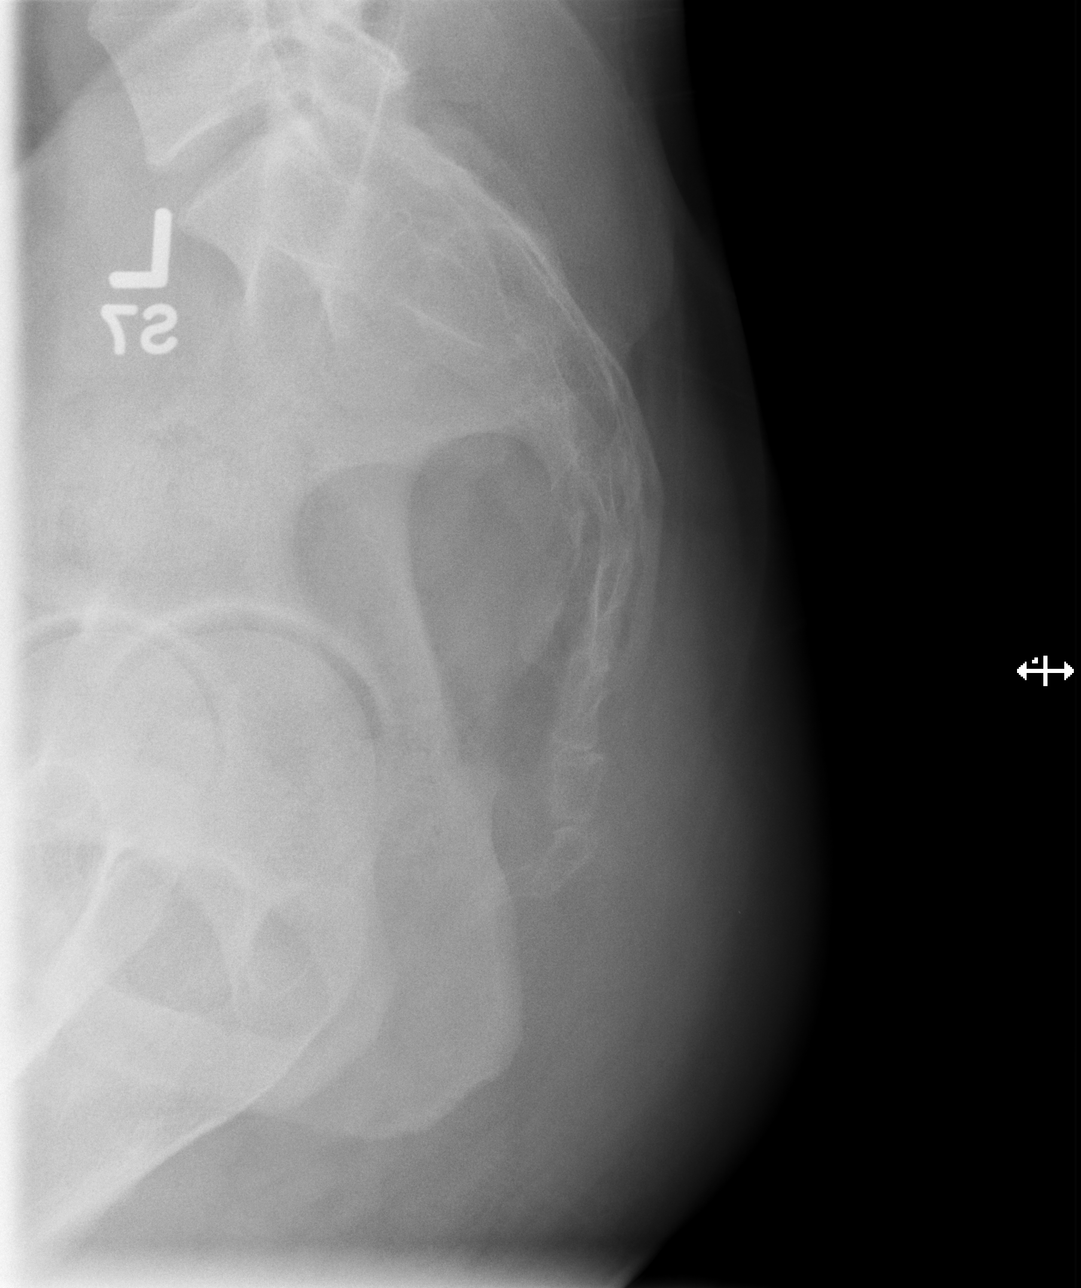

[3 of 3 positions shown; findings below may reference images not displayed]

FINDINGS: No evidence of acute fracture, subluxation or dislocation
identified.

No radio-opaque foreign bodies are present.

No focal bony lesions are noted.

The joint spaces are unremarkable.
IMPRESSION: No evidence of acute bony abnormality.

## 2012-05-20 MED ORDER — OXYCODONE-ACETAMINOPHEN 5-325 MG PO TABS
2.0000 | ORAL_TABLET | Freq: Once | ORAL | Status: AC
  Administered 2012-05-20: 2 via ORAL
  Filled 2012-05-20: qty 2

## 2012-05-20 MED ORDER — ONDANSETRON HCL 4 MG PO TABS
4.0000 mg | ORAL_TABLET | Freq: Once | ORAL | Status: AC
  Administered 2012-05-20: 4 mg via ORAL
  Filled 2012-05-20: qty 1
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
## 2012-05-20 NOTE — ED Notes (Signed)
Pt c/o fall on buttocks x2 weeks.  Reports that he fell on tailbone again last night.  Denies head injury or LOC.

## 2012-05-20 NOTE — ED Provider Notes (Signed)
Medical screening examination/treatment/procedure(s) were performed by non-physician practitioner and as supervising physician I was immediately available for consultation/collaboration. Devoria Albe, MD, FACEP   Ward Givens, MD 05/20/12 [facility] [place]

## 2012-05-24 ENCOUNTER — Ambulatory Visit (INDEPENDENT_AMBULATORY_CARE_PROVIDER_SITE_OTHER): Payer: Commercial Managed Care - PPO | Admitting: Physician Assistant

## 2012-05-24 DIAGNOSIS — F3160 Bipolar disorder, current episode mixed, unspecified: Secondary | ICD-10-CM

## 2012-05-24 DIAGNOSIS — F1120 Opioid dependence, uncomplicated: Secondary | ICD-10-CM

## 2012-05-24 DIAGNOSIS — F1994 Other psychoactive substance use, unspecified with psychoactive substance-induced mood disorder: Secondary | ICD-10-CM

## 2012-05-24 DIAGNOSIS — F4310 Post-traumatic stress disorder, unspecified: Secondary | ICD-10-CM

## 2012-06-18 ENCOUNTER — Encounter (HOSPITAL_COMMUNITY): Payer: Self-pay | Admitting: Physician Assistant

## 2012-06-18 NOTE — Progress Notes (Deleted)
Psychiatric Assessment Adult  Patient Identification:  Shane Cole. Date of Evaluation:  06/18/2012 Chief Complaint: *** History of Chief Complaint:   Chief Complaint  Patient presents with  . Anxiety  . Depression  . Establish Care    HPI Review of Systems Physical Exam  Depressive Symptoms: {DEPRESSION SYMPTOMS:20000}  (Hypo) Manic Symptoms:   Elevated Mood:  {BHH YES OR NO:22294} Irritable Mood:  {BHH YES OR NO:22294} Grandiosity:  {BHH YES OR NO:22294} Distractibility:  {BHH YES OR NO:22294} Labiality of Mood:  {BHH YES OR NO:22294} Delusions:  {BHH YES OR NO:22294} Hallucinations:  {BHH YES OR NO:22294} Impulsivity:  {BHH YES OR NO:22294} Sexually Inappropriate Behavior:  {BHH YES OR NO:22294} Financial Extravagance:  {BHH YES OR NO:22294} Flight of Ideas:  {BHH YES OR NO:22294}  Anxiety Symptoms: Excessive Worry:  {BHH YES OR NO:22294} Panic Symptoms:  {BHH YES OR NO:22294} Agoraphobia:  {BHH YES OR NO:22294} Obsessive Compulsive: {BHH YES OR NO:22294}  Symptoms: {Obsessive Compulsive Symptoms:22671} Specific Phobias:  {BHH YES OR NO:22294} Social Anxiety:  {BHH YES OR NO:22294}  Psychotic Symptoms:  Hallucinations: {BHH YES OR NO:22294} {Hallucinations:22672} Delusions:  {BHH YES OR NO:22294} Paranoia:  {BHH YES OR NO:22294}   Ideas of Reference:  {BHH YES OR NO:22294}  PTSD Symptoms: Ever had a traumatic exposure:  {BHH YES OR NO:22294} Had a traumatic exposure in the last month:  {BHH YES OR NO:22294} Re-experiencing: {BHH YES OR NO:22294} {Re-experiencing:22673} Hypervigilance:  {BHH YES OR NO:22294} Hyperarousal: {BHH YES OR NO:22294} {Hyperarousal:22674} Avoidance: {BHH YES OR NO:22294} {Avoidance:22675}  Traumatic Brain Injury: {BHH YES OR NO:22294} {Traumatic Brain Injury:22676}  Past Psychiatric History: Diagnosis: ***  Hospitalizations: ***  Outpatient Care: ***  Substance Abuse Care: ***  Self-Mutilation: ***  Suicidal Attempts:  ***  Violent Behaviors: ***   Past Medical History:   Past Medical History  Diagnosis Date  . Nerve pain   . Crush injury lower leg     Left lower leg  . Depression   . Migraine   . Bipolar disorder   . Allergy   . Anxiety   . Gastric ulcer   . GERD (gastroesophageal reflux disease)   . Hypertension    History of Loss of Consciousness:  {BHH YES OR NO:22294} Seizure History:  {BHH YES OR NO:22294} Cardiac History:  {BHH YES OR NO:22294} Allergies:   Allergies  Allergen Reactions  . Aspirin     Tinnitus, dizzy, short of breath  . Codeine Itching  . Penicillins Anaphylaxis  . Tramadol     seizure   Current Medications:  Current Outpatient Prescriptions  Medication Sig Dispense Refill  . gabapentin (NEURONTIN) 300 MG capsule Take 300 mg by mouth 4 (four) times daily.       Marland Kitchen ibuprofen (ADVIL,MOTRIN) 200 MG tablet Take 800 mg by mouth every 6 (six) hours as needed. pain      . metoprolol tartrate (LOPRESSOR) 25 MG tablet Take 25 mg by mouth daily.      . nortriptyline (PAMELOR) 75 MG capsule Take 1 capsule (75 mg total) by mouth at bedtime.  30 capsule  3  . oxyCODONE (OXYCONTIN) 10 MG 12 hr tablet Take 10 mg by mouth every 12 (twelve) hours.      Marland Kitchen oxyCODONE-acetaminophen (PERCOCET) 10-325 MG per tablet Take 1 tablet by mouth every 4 (four) hours as needed for pain. Pain       No current facility-administered medications for this visit.    Previous Psychotropic Medications:  Medication Dose   ***  ***  Substance Abuse History in the last 12 months: Substance Age of 1st Use Last Use Amount Specific Type  Nicotine  ***  ***  ***  ***  Alcohol  ***  ***  ***  ***  Cannabis  ***  ***  ***  ***  Opiates  ***  ***  ***  ***  Cocaine  ***  ***  ***  ***  Methamphetamines  ***  ***  ***  ***  LSD  ***  ***  ***  ***  Ecstasy  ***   ***  ***  ***  Benzodiazepines  ***  ***  ***  ***  Caffeine  ***  ***  ***  ***  Inhalants  ***  ***  ***  ***   Others:                          Medical Consequences of Substance Abuse: ***  Legal Consequences of Substance Abuse: ***  Family Consequences of Substance Abuse: ***  Blackouts:  {BHH YES OR NO:22294} DT's:  {BHH YES OR NO:22294} Withdrawal Symptoms:  {BHH YES OR NO:22294} {Withdrawal Symptoms:22677}  Social History: Current Place of Residence: *** Place of Birth: *** Family Members: *** Marital Status:  {Marital Status:22678} Children: ***  Sons: ***  Daughters: *** Relationships: *** Education:  {Education:22679} Educational Problems/Performance: *** Religious Beliefs/Practices: *** History of Abuse: {Desc; abuse:16542} Occupational Experiences; Military History:  {Military History:22680} Legal History: *** Hobbies/Interests: ***  Family History:   Family History  Problem Relation Age of Onset  . Hyperlipidemia Father   . Hypertension Father   . Cancer Paternal Grandfather     lung, colon  . Stroke    . Heart disease    . Diabetes Paternal Grandmother   . Cancer Paternal Grandmother   . Cancer Maternal Grandmother   . Cancer Maternal Grandfather   . Depression Paternal Aunt   . Anxiety disorder Paternal Aunt   . Anxiety disorder Father   . Drug abuse Father   . Drug abuse Paternal Uncle     Mental Status Examination/Evaluation: Objective:  Appearance: {Appearance:22683}  Eye Contact::  {BHH EYE CONTACT:22684}  Speech:  {Speech:22685}  Volume:  {Volume (PAA):22686}  Mood:  ***  Affect:  {Affect (PAA):22687}  Thought Process:  {Thought Process (PAA):22688}  Orientation:  {BHH ORIENTATION (PAA):22689}  Thought Content:  {Thought Content:22690}  Suicidal Thoughts:  {ST/HT (PAA):22692}  Homicidal Thoughts:  {ST/HT (PAA):22692}  Judgement:  {Judgement (PAA):22694}  Insight:  {Insight (PAA):22695}  Psychomotor Activity:  {Psychomotor (PAA):22696}  Akathisia:  {BHH YES OR NO:22294}  Handed:  {Handed:22697}  AIMS (if indicated):  ***  Assets:   {Assets (PAA):22698}    Laboratory/X-Ray Psychological Evaluation(s)   ***  ***   Assessment:  {axis diagnosis:3049000}  AXIS I {psych axis 1:31909}  AXIS II {psych axis 2:31910}  AXIS III Past Medical History  Diagnosis Date  . Nerve pain   . Crush injury lower leg     Left lower leg  . Depression   . Migraine   . Bipolar disorder   . Allergy   . Anxiety   . Gastric ulcer   . GERD (gastroesophageal reflux disease)   . Hypertension      AXIS IV {psych axis iv:31915}  AXIS V {psych axis v score:31919}   Treatment Plan/Recommendations:  Plan of Care: ***  Laboratory:  {Laboratory:22682}  Psychotherapy: ***  Medications: ***  Routine PRN Medications:  {BHH YES OR ZO:10960}  Consultations: ***  Safety Concerns:  ***  OtherJorje Guild, PA-C 3/28/20143:53 PM
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
## 2012-07-27 ENCOUNTER — Ambulatory Visit (HOSPITAL_COMMUNITY): Payer: Commercial Managed Care - PPO | Admitting: Physician Assistant

## 2012-11-17 ENCOUNTER — Ambulatory Visit (INDEPENDENT_AMBULATORY_CARE_PROVIDER_SITE_OTHER): Payer: 59 | Admitting: Family Medicine

## 2012-11-17 ENCOUNTER — Ambulatory Visit: Payer: 59

## 2012-11-17 VITALS — BP 120/76 | HR 93 | Temp 98.0°F | Resp 16 | Ht 70.0 in | Wt 219.0 lb

## 2012-11-17 DIAGNOSIS — N50812 Left testicular pain: Secondary | ICD-10-CM

## 2012-11-17 DIAGNOSIS — N509 Disorder of male genital organs, unspecified: Secondary | ICD-10-CM

## 2012-11-17 DIAGNOSIS — R140 Abdominal distension (gaseous): Secondary | ICD-10-CM

## 2012-11-17 DIAGNOSIS — R141 Gas pain: Secondary | ICD-10-CM

## 2012-11-17 DIAGNOSIS — R197 Diarrhea, unspecified: Secondary | ICD-10-CM

## 2012-11-17 DIAGNOSIS — K219 Gastro-esophageal reflux disease without esophagitis: Secondary | ICD-10-CM

## 2012-11-17 LAB — POCT URINALYSIS DIPSTICK
Bilirubin, UA: NEGATIVE
Blood, UA: NEGATIVE
Glucose, UA: NEGATIVE
Ketones, UA: NEGATIVE
Leukocytes, UA: NEGATIVE
Nitrite, UA: NEGATIVE
Protein, UA: NEGATIVE
Spec Grav, UA: 1.015
Urobilinogen, UA: 0.2
pH, UA: 7.5

## 2012-11-17 LAB — POCT CBC
Granulocyte percent: 62 %G (ref 37–80)
HCT, POC: 47.9 % (ref 43.5–53.7)
Hemoglobin: 15.6 g/dL (ref 14.1–18.1)
Lymph, poc: 2.2 (ref 0.6–3.4)
MCH, POC: 31.5 pg — AB (ref 27–31.2)
MCHC: 32.6 g/dL (ref 31.8–35.4)
MCV: 96.6 fL (ref 80–97)
MID (cbc): 0.5 (ref 0–0.9)
MPV: 8.2 fL (ref 0–99.8)
POC Granulocyte: 4.5 (ref 2–6.9)
POC LYMPH PERCENT: 31.2 %L (ref 10–50)
POC MID %: 6.8 %M (ref 0–12)
Platelet Count, POC: 248 10*3/uL (ref 142–424)
RBC: 4.96 M/uL (ref 4.69–6.13)
RDW, POC: 13.5 %
WBC: 7.2 10*3/uL (ref 4.6–10.2)

## 2012-11-17 LAB — POCT UA - MICROSCOPIC ONLY
Bacteria, U Microscopic: NEGATIVE
Casts, Ur, LPF, POC: NEGATIVE
Crystals, Ur, HPF, POC: NEGATIVE
Epithelial cells, urine per micros: NEGATIVE
Mucus, UA: NEGATIVE
RBC, urine, microscopic: NEGATIVE
WBC, Ur, HPF, POC: NEGATIVE
Yeast, UA: NEGATIVE

## 2012-11-17 LAB — IFOBT (OCCULT BLOOD): IFOBT: NEGATIVE

## 2012-11-17 IMAGING — CR DG ABDOMEN 1V
1 series · 1 of 1 positions shown · non-contrast
Comparison: None.

CLINICAL DATA: Diarrhea, abdominal bloating

ABDOMEN - 1 VIEW

[AP]
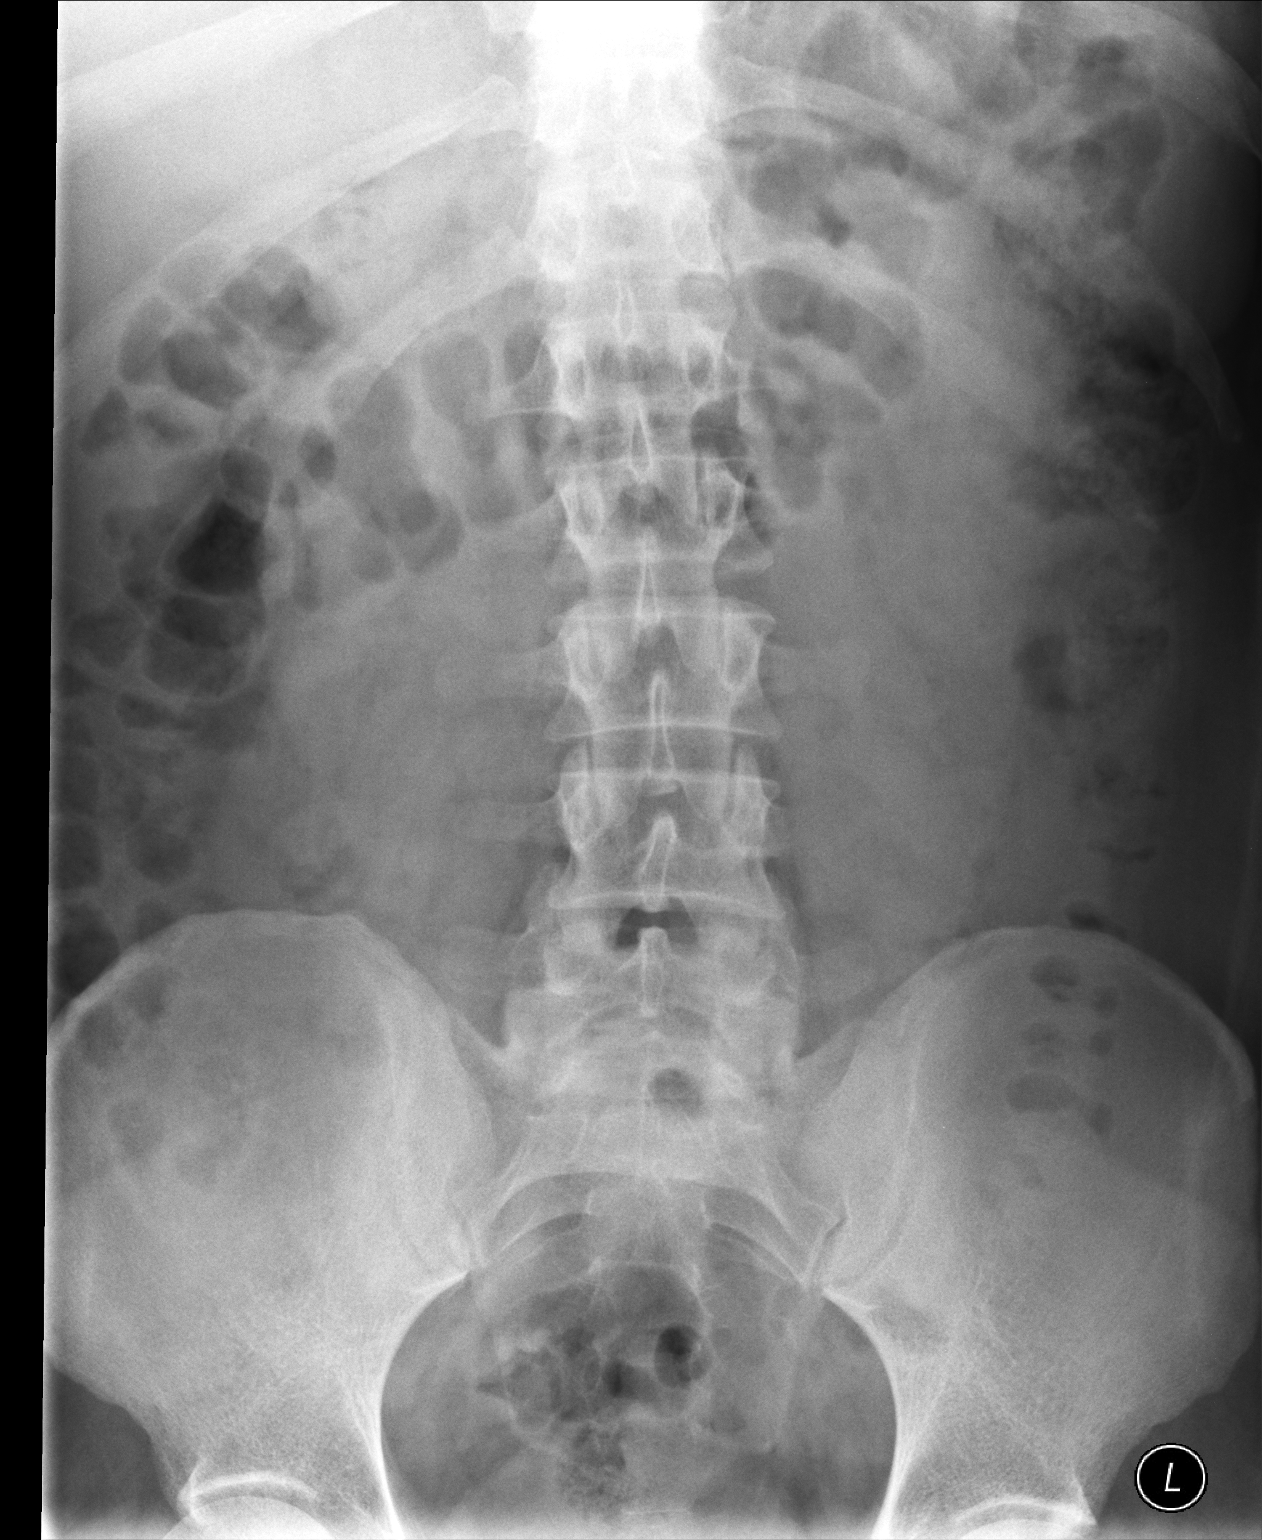

[1 of 1 positions shown; findings below may reference images not displayed]

FINDINGS: There is nonspecific nonobstructive bowel gas pattern.
Minimal lumbar levoscoliosis.
IMPRESSION: Nonspecific nonobstructive bowel gas pattern.

Clinically significant discrepancy from primary report, if
provided: None

## 2012-11-17 MED ORDER — SULFAMETHOXAZOLE-TMP DS 800-160 MG PO TABS: 1.0000 | ORAL_TABLET | Freq: Two times a day (BID) | ORAL | Status: DC

## 2012-11-17 MED ORDER — PANTOPRAZOLE SODIUM 40 MG PO TBEC: 40.0000 mg | DELAYED_RELEASE_TABLET | Freq: Every day | ORAL | Status: DC

## 2012-11-17 NOTE — Progress Notes (Signed)
Is a 32 year old man works at [employer]. He does work in a Administrator, Civil Service. He had dropped a slender granite causing his left foot fracture with subsequent neuropathy.  Patient comes in today with a 30 pound weight gain over last 2-3 months and bloating. He went to [facility] last week and had a CAT scan which did not reveal any problem. They thought he had mesenteric adenitis. He also has had diarrhea.  Nevertheless patient continues to have bloating associated with left testicle pain. He says the pain radiates from his testicle up into the left groin.  Objective: No acute distress Chest: Clear Heart: Regular no murmur, gallop Abdomen: Mildly distended with no HSM, no masses, no guarding or rebound Genitalia: No hernia, normal testes both by palpation and inspection Rectal exam mildly enlarged prostate with no nodules  [facility] reading (PRIMARY) by  Dr. Milus Glazier:  KUB:  No acute findings..   Bloating - Plan: DG Abd 1 View, POCT urinalysis dipstick, POCT UA - Microscopic Only, POCT CBC, IFOBT POC (occult bld, rslt in office), Comprehensive metabolic panel, sulfamethoxazole-trimethoprim (BACTRIM DS) 800-160 MG per tablet  Diarrhea - Plan: sulfamethoxazole-trimethoprim (BACTRIM DS) 800-160 MG per tablet  Pain in left testicle - Plan: sulfamethoxazole-trimethoprim (BACTRIM DS) 800-160 MG per tablet  GERD (gastroesophageal reflux disease) - Plan: pantoprazole (PROTONIX) 40 MG tablet  Signed, Elvina Sidle, MD

## 2012-11-18 LAB — COMPREHENSIVE METABOLIC PANEL
ALT: 27 U/L (ref 0–53)
AST: 17 U/L (ref 0–37)
Albumin: 5.1 g/dL (ref 3.5–5.2)
Alkaline Phosphatase: 68 U/L (ref 39–117)
BUN: 22 mg/dL (ref 6–23)
CO2: 29 mEq/L (ref 19–32)
Calcium: 9.3 mg/dL (ref 8.4–10.5)
Chloride: 102 mEq/L (ref 96–112)
Creat: 1.36 mg/dL — ABNORMAL HIGH (ref 0.50–1.35)
Glucose, Bld: 83 mg/dL (ref 70–99)
Potassium: 4.4 mEq/L (ref 3.5–5.3)
Sodium: 141 mEq/L (ref 135–145)
Total Bilirubin: 0.3 mg/dL (ref 0.3–1.2)
Total Protein: 7.3 g/dL (ref 6.0–8.3)

## 2013-01-19 ENCOUNTER — Encounter (HOSPITAL_BASED_OUTPATIENT_CLINIC_OR_DEPARTMENT_OTHER): Payer: Self-pay | Admitting: *Deleted

## 2013-01-21 ENCOUNTER — Other Ambulatory Visit: Payer: Self-pay | Admitting: Orthopedic Surgery

## 2013-01-24 NOTE — H&P (Signed)
Shane Cole. is an 32 y.o. male.   Chief Complaint: c/o mass dorsum left long finger PIP joint. 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 He was noted to have a cyst with discomfort on the dorsal aspect of his left long finger. 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 Shane Cole fractured multiple fingers. He is now referred for an upper extremity orthopaedic consult.     Past Medical History  Diagnosis Date  . Nerve pain   . Crush injury lower leg     Left lower leg  . Depression   . Migraine   . Bipolar disorder   . Allergy   . Anxiety   . Gastric ulcer   . GERD (gastroesophageal reflux disease)     History reviewed. No pertinent past surgical history.  Family History  Problem Relation Age of Onset  . Hyperlipidemia Father   . Hypertension Father   . Cancer Paternal Grandfather     lung, colon  . Stroke    . Heart disease    . Diabetes Paternal Grandmother   . Cancer Paternal Grandmother   . Cancer Maternal Grandmother   . Cancer Maternal Grandfather   . Depression Paternal Aunt   . Anxiety disorder Paternal Aunt   . Anxiety disorder Father   . Drug abuse Father   . Drug abuse Paternal Uncle    Social History:  reports that he has been smoking.  He has never used smokeless tobacco. He reports that he drinks alcohol. He reports that he uses illicit drugs.  Allergies:  Allergies  Allergen Reactions  . Aspirin     Tinnitus, dizzy, short of breath  . Codeine Itching  . Penicillins Anaphylaxis  . Tramadol     seizure    No prescriptions prior to admission    No results found for this or any previous visit (from the past 48 hour(s)).  No results found.   Pertinent items are noted in HPI.  Height 5\' 11"  (1.803 m), weight 99.791 kg (220 lb).  General appearance: alert Head: Normocephalic, without obvious abnormality Neck: supple, symmetrical, trachea midline Resp:  clear to auscultation bilaterally Cardio: regular rate and rhythm GI: normal findings: bowel sounds normal Extremities: Inspection of his hand reveals a 4 by 5 mm bump on the dorsal aspect of his left long finger PIP/base of the middle phalanx. This is firm. It has the feel of a mucoid cyst in the extensor tendon and may have an underlying osteophyte. He has full ROM of his fingers in flexion/extension. His pulses and capillary refill are intact.   X-rays dated 12-07-12 demonstrate a bony osteophyte on the dorsal aspect of his long finger middle phalangeal base probably due to his prior trauma. This is probably irritating the extensor mechanism leading to the development of the myxoid cyst.   Pulses: 2+ and symmetric Skin: normal Neurologic: Grossly normal    Assessment/Plan Impression: Mass dorsal aspect left long finger PIP joint.  [name] has been counseled in detail regarding the nature of his mass. 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 He has been insistent that this be removed due to discomfort. We have carefully explained to him that we cannot guarantee that he will not develop another cyst if he has a propensity to develop myxoid cysts of collagenous structures.  Future episodes of trauma to his hand could induce the onset of nodules and/or fibromatosis type responses. Trauma to the hand can induce foreign body reactions as well.  Plan: TO the OR for excision mass left long finger PIP joint.The procedure, risks,benefits and post-op course were discussed with the patient at length and they were in agreement with the plan.  DASNOIT,Markos Theil J 01/24/2013, 11:35 AM  H&P documentation: 01/25/2013  -History and Physical Reviewed  -Patient has been re-examined  -No change in the plan of care  Wyn Forster, MD

## 2013-01-25 ENCOUNTER — Encounter (HOSPITAL_BASED_OUTPATIENT_CLINIC_OR_DEPARTMENT_OTHER)
Admission: RE | Disposition: A | Discharge status: Home / Self Care | Payer: Self-pay | Source: Ambulatory Visit | Attending: Orthopedic Surgery

## 2013-01-25 ENCOUNTER — Encounter (HOSPITAL_BASED_OUTPATIENT_CLINIC_OR_DEPARTMENT_OTHER): Payer: 59 | Admitting: Anesthesiology

## 2013-01-25 ENCOUNTER — Encounter (HOSPITAL_BASED_OUTPATIENT_CLINIC_OR_DEPARTMENT_OTHER): Payer: Self-pay | Admitting: Orthopedic Surgery

## 2013-01-25 ENCOUNTER — Ambulatory Visit (HOSPITAL_BASED_OUTPATIENT_CLINIC_OR_DEPARTMENT_OTHER)
Admission: RE | Admit: 2013-01-25 | Discharge: 2013-01-25 | Disposition: A | Discharge status: Home / Self Care | Payer: 59 | Source: Ambulatory Visit | Attending: Orthopedic Surgery | Admitting: Orthopedic Surgery

## 2013-01-25 ENCOUNTER — Ambulatory Visit (HOSPITAL_BASED_OUTPATIENT_CLINIC_OR_DEPARTMENT_OTHER): Payer: 59 | Admitting: Anesthesiology

## 2013-01-25 DIAGNOSIS — F411 Generalized anxiety disorder: Secondary | ICD-10-CM | POA: Insufficient documentation

## 2013-01-25 DIAGNOSIS — G43909 Migraine, unspecified, not intractable, without status migrainosus: Secondary | ICD-10-CM | POA: Insufficient documentation

## 2013-01-25 DIAGNOSIS — K219 Gastro-esophageal reflux disease without esophagitis: Secondary | ICD-10-CM | POA: Insufficient documentation

## 2013-01-25 DIAGNOSIS — F3130 Bipolar disorder, current episode depressed, mild or moderate severity, unspecified: Secondary | ICD-10-CM | POA: Insufficient documentation

## 2013-01-25 DIAGNOSIS — M898X9 Other specified disorders of bone, unspecified site: Secondary | ICD-10-CM | POA: Insufficient documentation

## 2013-01-25 DIAGNOSIS — Z8711 Personal history of peptic ulcer disease: Secondary | ICD-10-CM | POA: Insufficient documentation

## 2013-01-25 HISTORY — PX: MASS EXCISION: SHX2000

## 2013-01-25 LAB — POCT HEMOGLOBIN-HEMACUE: Hemoglobin: 16.1 g/dL (ref 13.0–17.0)

## 2013-01-25 SURGERY — EXCISION MASS
Anesthesia: General | Site: Finger | Laterality: Left | Wound class: Clean

## 2013-01-25 MED ORDER — PROPOFOL INFUSION 10 MG/ML OPTIME
INTRAVENOUS | Status: DC | PRN
  Administered 2013-01-25: 50 ug/kg/min via INTRAVENOUS

## 2013-01-25 MED ORDER — MIDAZOLAM HCL 5 MG/5ML IJ SOLN
INTRAMUSCULAR | Status: DC | PRN
  Administered 2013-01-25: 2 mg via INTRAVENOUS

## 2013-01-25 MED ORDER — FENTANYL CITRATE 0.05 MG/ML IJ SOLN: 50.0000 ug | INTRAMUSCULAR | Status: DC | PRN

## 2013-01-25 MED ORDER — LIDOCAINE HCL (CARDIAC) 20 MG/ML IV SOLN
INTRAVENOUS | Status: DC | PRN
  Administered 2013-01-25: 50 mg via INTRAVENOUS

## 2013-01-25 MED ORDER — HYDROMORPHONE HCL PF 1 MG/ML IJ SOLN: 0.2500 mg | INTRAMUSCULAR | Status: DC | PRN

## 2013-01-25 MED ORDER — LACTATED RINGERS IV SOLN
INTRAVENOUS | Status: DC
  Administered 2013-01-25: 10:00:00 via INTRAVENOUS

## 2013-01-25 MED ORDER — MIDAZOLAM HCL 2 MG/2ML IJ SOLN
1.0000 mg | INTRAMUSCULAR | Status: DC | PRN
Start: 2013-01-25 — End: 2013-01-25

## 2013-01-25 MED ORDER — FENTANYL CITRATE 0.05 MG/ML IJ SOLN
INTRAMUSCULAR | Status: DC | PRN
Start: 2013-01-25 — End: 2013-01-25
  Administered 2013-01-25: 50 ug via INTRAVENOUS
  Administered 2013-01-25 (×2): 25 ug via INTRAVENOUS

## 2013-01-25 MED ORDER — MIDAZOLAM HCL 2 MG/ML PO SYRP: 12.0000 mg | ORAL_SOLUTION | Freq: Once | ORAL | Status: DC | PRN

## 2013-01-25 MED ORDER — PROPOFOL 10 MG/ML IV BOLUS
INTRAVENOUS | Status: AC
  Filled 2013-01-25: qty 20

## 2013-01-25 MED ORDER — IBUPROFEN 200 MG PO TABS
ORAL_TABLET | ORAL | Status: AC
  Filled 2013-01-25: qty 3

## 2013-01-25 MED ORDER — IBUPROFEN 600 MG PO TABS
600.0000 mg | ORAL_TABLET | Freq: Four times a day (QID) | ORAL | Status: DC | PRN
  Administered 2013-01-25: 600 mg via ORAL

## 2013-01-25 MED ORDER — OXYCODONE HCL 5 MG PO TABS: 5.0000 mg | ORAL_TABLET | Freq: Once | ORAL | Status: DC | PRN

## 2013-01-25 MED ORDER — LIDOCAINE HCL 2 % IJ SOLN
INTRAMUSCULAR | Status: DC | PRN
  Administered 2013-01-25: 4.5 mL

## 2013-01-25 MED ORDER — HYDROMORPHONE HCL 2 MG PO TABS: 2.0000 mg | ORAL_TABLET | ORAL | Status: DC | PRN

## 2013-01-25 MED ORDER — MIDAZOLAM HCL 2 MG/2ML IJ SOLN
INTRAMUSCULAR | Status: AC
  Filled 2013-01-25: qty 2

## 2013-01-25 MED ORDER — FENTANYL CITRATE 0.05 MG/ML IJ SOLN
INTRAMUSCULAR | Status: AC
  Filled 2013-01-25: qty 2

## 2013-01-25 MED ORDER — OXYCODONE HCL 5 MG/5ML PO SOLN
5.0000 mg | Freq: Once | ORAL | Status: DC | PRN
Start: 2013-01-25 — End: 2013-01-25

## 2013-01-25 MED ORDER — PROMETHAZINE HCL 25 MG/ML IJ SOLN: 6.2500 mg | INTRAMUSCULAR | Status: DC | PRN

## 2013-01-25 MED ORDER — CHLORHEXIDINE GLUCONATE 4 % EX LIQD: 60.0000 mL | Freq: Once | CUTANEOUS | Status: DC

## 2013-01-25 MED ORDER — ONDANSETRON HCL 4 MG/2ML IJ SOLN
INTRAMUSCULAR | Status: DC | PRN
  Administered 2013-01-25: 4 mg via INTRAVENOUS

## 2013-01-25 SURGICAL SUPPLY — 47 items
BANDAGE ADHESIVE 1X3 (GAUZE/BANDAGES/DRESSINGS) IMPLANT
BANDAGE COBAN STERILE 2 (GAUZE/BANDAGES/DRESSINGS) IMPLANT
BANDAGE ELASTIC 3 VELCRO ST LF (GAUZE/BANDAGES/DRESSINGS) IMPLANT
BANDAGE GAUZE ELAST BULKY 4 IN (GAUZE/BANDAGES/DRESSINGS) IMPLANT
BLADE MINI RND TIP GREEN BEAV (BLADE) IMPLANT
BLADE SURG 15 STRL LF DISP TIS (BLADE) ×1 IMPLANT
BLADE SURG 15 STRL SS (BLADE) ×1
BNDG COHESIVE 1X5 TAN STRL LF (GAUZE/BANDAGES/DRESSINGS) ×2 IMPLANT
BNDG COHESIVE 3X5 TAN STRL LF (GAUZE/BANDAGES/DRESSINGS) IMPLANT
BNDG ELASTIC 2 VLCR STRL LF (GAUZE/BANDAGES/DRESSINGS) IMPLANT
BNDG ESMARK 4X9 LF (GAUZE/BANDAGES/DRESSINGS) ×2 IMPLANT
BRUSH SCRUB EZ PLAIN DRY (MISCELLANEOUS) ×2 IMPLANT
CORDS BIPOLAR (ELECTRODE) ×2 IMPLANT
COVER MAYO STAND STRL (DRAPES) ×2 IMPLANT
COVER TABLE BACK 60X90 (DRAPES) ×2 IMPLANT
CUFF TOURNIQUET SINGLE 18IN (TOURNIQUET CUFF) ×2 IMPLANT
DECANTER SPIKE VIAL GLASS SM (MISCELLANEOUS) IMPLANT
DRAIN PENROSE 1/2X12 LTX STRL (WOUND CARE) IMPLANT
DRAIN PENROSE 1/4X12 LTX STRL (WOUND CARE) IMPLANT
DRAPE EXTREMITY T 121X128X90 (DRAPE) ×2 IMPLANT
DRAPE SURG 17X23 STRL (DRAPES) ×2 IMPLANT
GAUZE XEROFORM 1X8 LF (GAUZE/BANDAGES/DRESSINGS) IMPLANT
GLOVE BIOGEL M STRL SZ7.5 (GLOVE) ×2 IMPLANT
GLOVE ORTHO TXT STRL SZ7.5 (GLOVE) ×2 IMPLANT
GOWN BRE IMP PREV XXLGXLNG (GOWN DISPOSABLE) ×4 IMPLANT
GOWN PREVENTION PLUS XLARGE (GOWN DISPOSABLE) ×4 IMPLANT
NEEDLE 27GAX1X1/2 (NEEDLE) ×2 IMPLANT
NEEDLE BLUNT 17GA (NEEDLE) IMPLANT
NS IRRIG 1000ML POUR BTL (IV SOLUTION) IMPLANT
PACK BASIN DAY SURGERY FS (CUSTOM PROCEDURE TRAY) ×2 IMPLANT
PADDING CAST ABS 4INX4YD NS (CAST SUPPLIES) ×1
PADDING CAST ABS COTTON 4X4 ST (CAST SUPPLIES) ×1 IMPLANT
PADDING UNDERCAST 2  STERILE (CAST SUPPLIES) IMPLANT
SPONGE GAUZE 4X4 12PLY (GAUZE/BANDAGES/DRESSINGS) ×2 IMPLANT
STOCKINETTE 4X48 STRL (DRAPES) ×2 IMPLANT
STRIP CLOSURE SKIN 1/2X4 (GAUZE/BANDAGES/DRESSINGS) IMPLANT
SUT ETHILON 5 0 P 3 18 (SUTURE)
SUT MERSILENE 4 0 P 3 (SUTURE) ×2 IMPLANT
SUT NYLON ETHILON 5-0 P-3 1X18 (SUTURE) IMPLANT
SUT PROLENE 3 0 PS 2 (SUTURE) IMPLANT
SUT PROLENE 4 0 P 3 18 (SUTURE) ×2 IMPLANT
SYR 20CC LL (SYRINGE) IMPLANT
SYR 3ML 23GX1 SAFETY (SYRINGE) IMPLANT
SYR CONTROL 10ML LL (SYRINGE) ×2 IMPLANT
TOWEL OR 17X24 6PK STRL BLUE (TOWEL DISPOSABLE) ×4 IMPLANT
TRAY DSU PREP LF (CUSTOM PROCEDURE TRAY) ×2 IMPLANT
UNDERPAD 30X30 INCONTINENT (UNDERPADS AND DIAPERS) ×2 IMPLANT

## 2013-01-25 NOTE — Anesthesia Postprocedure Evaluation (Signed)
  Anesthesia Post-op Note  Patient: Shane Cole.  Procedure(s) Performed: Procedure(s) with comments: EXCISION MASS DORSAL ASPECT LEFT LONG FINGER DISTAL INTERPHALANGEAL JOINT (Left) - Left long   Patient Location: PACU  Anesthesia Type:General  Level of Consciousness: awake  Airway and Oxygen Therapy: Patient Spontanous Breathing  Post-op Pain: none  Post-op Assessment: Post-op Vital signs reviewed  Post-op Vital Signs: stable  Complications: No apparent anesthesia complications

## 2013-01-25 NOTE — Op Note (Signed)
[id number] 

## 2013-01-25 NOTE — Transfer of Care (Signed)
Immediate Anesthesia Transfer of Care Note  Patient: Shane Cole.  Procedure(s) Performed: Procedure(s) with comments: EXCISION MASS DORSAL ASPECT LEFT LONG FINGER DISTAL INTERPHALANGEAL JOINT (Left) - Left long   Patient Location: PACU  Anesthesia Type:MAC  Level of Consciousness: awake, alert , oriented and patient cooperative  Airway & Oxygen Therapy: Patient Spontanous Breathing and Patient connected to face mask oxygen  Post-op Assessment: Report given to PACU RN and Post -op Vital signs reviewed and stable  Post vital signs: Reviewed and stable  Complications: No apparent anesthesia complications

## 2013-01-25 NOTE — Anesthesia Preprocedure Evaluation (Addendum)
Anesthesia Evaluation  Patient identified by MRN, date of birth, ID band Patient awake    Reviewed: Allergy & Precautions, H&P , NPO status , Patient's Chart, lab work & pertinent test results  Airway Mallampati: II  Neck ROM: Full    Dental  (+) Edentulous Upper   Pulmonary  breath sounds clear to auscultation        Cardiovascular negative cardio ROS  Rhythm:Regular Rate:Normal     Neuro/Psych  Headaches, PSYCHIATRIC DISORDERS    GI/Hepatic PUD, GERD-  ,  Endo/Other    Renal/GU      Musculoskeletal   Abdominal   Peds  Hematology   Anesthesia Other Findings   Reproductive/Obstetrics                          Anesthesia Physical Anesthesia Plan  ASA: II  Anesthesia Plan: General   Post-op Pain Management:    Induction: Intravenous  Airway Management Planned: LMA  Additional Equipment:   Intra-op Plan:   Post-operative Plan: Extubation in OR  Informed Consent: I have reviewed the patients History and Physical, chart, labs and discussed the procedure including the risks, benefits and alternatives for the proposed anesthesia with the patient or authorized representative who has indicated his/her understanding and acceptance.   Dental advisory given  Plan Discussed with: CRNA and Surgeon  Anesthesia Plan Comments:         Anesthesia Quick Evaluation

## 2013-01-25 NOTE — Brief Op Note (Signed)
01/25/2013  12:35 PM  PATIENT:  Shane Cole.  32 y.o. male  PRE-OPERATIVE DIAGNOSIS:  LEFT LONG FINGER DISTAL INTERPHALANGEAL JOINT;DORSAL ASPECT MASS  POST-OPERATIVE DIAGNOSIS:  LEFT LONG FINGER DISTAL INTERPHALANGEAL JOINT;DORSAL ASPECT MASS  PROCEDURE:  Procedure(s) with comments: EXCISION MASS DORSAL ASPECT LEFT LONG FINGER DISTAL INTERPHALANGEAL JOINT (Left) - Left long   SURGEON:  Surgeon(s) and Role:    * Wyn Forster., MD - Primary  PHYSICIAN ASSISTANT:   ASSISTANTS: Surgical technician  ANESTHESIA:   IV sedation  EBL:  Total I/O In: 900 [I.V.:900] Out: -   BLOOD ADMINISTERED:none  DRAINS: none   LOCAL MEDICATIONS USED:  LIDOCAINE   SPECIMEN:  No Specimen  DISPOSITION OF SPECIMEN:  N/A  COUNTS:  YES  TOURNIQUET:   Total Tourniquet Time Documented: Upper Arm (Left) - 21 minutes Total: Upper Arm (Left) - 21 minutes   DICTATION: .Other Dictation: Dictation Number [id number]  PLAN OF CARE: Discharge to home after PACU  PATIENT DISPOSITION:  PACU - hemodynamically stable.   Delay start of Pharmacological VTE agent (>24hrs) due to surgical blood loss or risk of bleeding: not applicable

## 2013-01-26 NOTE — Op Note (Deleted)
NAMEMarland Kitchen  Shane, Cole NO.:  [id number]  MEDICAL RECORD NO.:  [id number]  LOCATION:  [facility]                         FACILITY:  [facility]  PHYSICIAN:  Katy Fitch. Travian Kerner, M.D. 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  OPERATION:  Resection of Turret exostosis and repair of central slip, left long finger.  OPERATING SURGEON:  Katy Fitch. Darden Flemister, MD.  ASSISTANT:  Surgical technician.  ANESTHESIA:  A 2% lidocaine metacarpal head level block, left long finger supplemented by IV sedation.  SUPERVISING ANESTHESIOLOGIST:  Burna Forts, MD.  INDICATIONS:  Shane Cole is a 32 year old gentleman who was referred by Dr. Pati Gallo of [facility] for management of a chronically painful left long finger.  [name] has worked a number of jobs that were very physical over the years.  He states he has broken at least 8 of his 10 digits.  He recently developed a mass over the base of his left long finger middle phalanx at the insertion of the central slip.  Dr. Farris Has saw him for evaluation, obtained x-rays which revealed a Turret exostosis and referred him for hand surgery consult.  On clinical examination in the office, he was noted to have a myxoid cyst superimposed on the Turret exostosis.  We initially treated this in the office by barbotage of the cyst and infiltration of steroid.  This resolved the cyst, however, the bony bump persisted and [name] stated this was extremely painful and annoying for him.  He urged [place] to resect the exostosis.  We advised him that  we could easily do this from a technical standpoint, but could not guarantee he would not grow another exostosis.  With informed consent, he was brought to the operating room at this time planning to remove the exostosis and repair his triangular ligament and central slip as necessary.  PROCEDURE:  Shane Cole is brought to room 2 of the [facility] and placed in supine position on the operating table.  Following sedation under Dr. Marlane Mingle direct supervision, the left long finger and hand were prepped with Betadine followed by infiltration of 4.5 mL of 2% lidocaine at metacarpal head level to obtain a digital block.  After 5 minutes, excellent anesthesia was achieved.  The left hand and arm were then prepped with Betadine soap and solution and sterilely draped.  A pneumatic tourniquet was applied to the proximal brachium.  Following exsanguination of the left arm with an Esmarch bandage, the arterial tourniquet on the proximal brachium was inflated to 220 mmHg.  Following routine surgical time-out, procedure commenced with an oblique incision directly over the mass.  Subcutaneous tissues were carefully divided taking care to identify the remnants of the cyst and steroid present from the barbotage.  The central slip was intact proximally and the triangular ligament was expanded by the mass.  The triangular ligament was incised longitudinally.  With great care, a micro curette was used to peel the triangular ligament off the mass.  A Turret exostosis that almost had features of a small osteochondroma was identified.  This was subsequently resected with a rongeur down to a stable base and slightly excavated below the cortex.  No other masses were noted.  We elected not to enter the joint as this appeared to be entirely extra-articular.  The triangular ligament was then repaired anatomically with two 4-0 Mersilene mattress sutures, figure-of-eight style knots  buried.  The skin was repaired with intradermal 4-0 Prolene.  A Steri-Strip was applied.  For aftercare, [name] was provided prescription for Dilaudid 2 mg 1 or 2 tablets p.o. q.4-6 hours p.r.n. pain.  He uses fentanyl patches for pain predicament, therefore is relatively tolerant to narcotic analgesics.  We will see him back for followup in a week for suture removal.  He is advised to rest his hand.     Katy Fitch Manan Olmo, M.D.     RVS/MEDQ  D:  01/25/2013  T:  01/26/2013  Job:  [id number]

## 2013-01-26 NOTE — Op Note (Signed)
[name], GWINNER NO.:  [id number]  MEDICAL RECORD NO.:  [id number]  LOCATION:  [facility]                         FACILITY:  [facility]  PHYSICIAN:  Katy Fitch. Pasco Marchitto, M.D. 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  OPERATION:  Resection of Turret exostosis and repair of central slip, left long finger.  OPERATING SURGEON:  Katy Fitch. Shane Haire, MD.  ASSISTANT:  Surgical technician.  ANESTHESIA:  A 2% lidocaine metacarpal head level block, left long finger supplemented by IV sedation.  SUPERVISING ANESTHESIOLOGIST:  Shane Forts, MD.  INDICATIONS:  Shane Cole is a 32 year old gentleman who was referred by Dr. Pati Gallo of [facility] for management of a chronically painful left long finger.  [name] has worked a number of jobs that were very physical over the years.  He states he has broken at least 8 of his 10 digits.  He recently developed a mass over the base of his left long finger middle phalanx at the insertion of the central slip.  Dr. Farris Has saw him for evaluation, obtained x-rays which revealed a Turret exostosis and referred him for hand surgery consult.  On clinical examination in the office, he was noted to have a myxoid cyst superimposed on the Turret exostosis.  We initially treated this in the office by barbotage of the cyst and infiltration of steroid.  This resolved the cyst, however, the bony bump persisted and Shane Cole stated this was extremely painful and annoying for him.  He urged [place] to resect the exostosis.  We advised him that  we could easily do this from a technical standpoint, but could not guarantee he would not grow another exostosis.  With informed consent, he was brought to the operating room at this time planning to remove the exostosis and repair his triangular ligament and central slip as necessary.  PROCEDURE:  Shane Cole is brought to room 2 of the [facility] and placed in supine position on the operating table.  Following sedation under Dr. Marlane Mingle direct supervision, the left long finger and hand were prepped with Betadine followed by infiltration of 4.5 mL of 2% lidocaine at metacarpal head level to obtain a digital block.  After 5 minutes, excellent anesthesia was achieved.  The left hand and arm were then prepped with Betadine soap and solution and sterilely draped.  A pneumatic tourniquet was applied to the proximal brachium.  Following exsanguination of the left arm with an Esmarch bandage, the arterial tourniquet on the proximal brachium was inflated to 220 mmHg.  Following routine surgical time-out, procedure commenced with an oblique incision directly over the mass.  Subcutaneous tissues were carefully divided taking care to identify the remnants of the cyst and steroid present from the barbotage.  The central slip was intact proximally and the triangular ligament was expanded by the mass.  The triangular ligament was incised longitudinally.  With great care, a micro curette was used to peel the triangular ligament off the mass.  A Turret exostosis that almost had features of a small osteochondroma was identified.  This was subsequently resected with a rongeur down to a stable base and slightly excavated below the cortex.  No other masses were noted.  We elected not to enter the joint as this appeared to be entirely extra-articular.  The triangular ligament was then repaired anatomically with two 4-0 Mersilene mattress sutures, figure-of-eight style knots  buried.  The skin was repaired with intradermal 4-0 Prolene.  A Steri-Strip was applied.  For aftercare, Shane Cole was provided prescription for Dilaudid 2 mg 1 or 2 tablets p.o. q.4-6 hours p.r.n. pain.  He uses fentanyl patches for pain predicament, therefore is relatively tolerant to narcotic analgesics.  We will see him back for followup in a week for suture removal.  He is advised to rest his hand.     Katy Fitch Jairy Angulo, M.D.     RVS/MEDQ  D:  01/25/2013  T:  01/26/2013  Job:  [id number]

## 2013-01-27 ENCOUNTER — Encounter (HOSPITAL_BASED_OUTPATIENT_CLINIC_OR_DEPARTMENT_OTHER): Payer: Self-pay | Admitting: Orthopedic Surgery

## 2013-02-08 ENCOUNTER — Emergency Department (HOSPITAL_COMMUNITY): Payer: 59

## 2013-02-08 ENCOUNTER — Emergency Department (HOSPITAL_COMMUNITY)
Admission: EM | Admit: 2013-02-08 | Discharge: 2013-02-08 | Disposition: A | Discharge status: Home / Self Care | Payer: 59 | Attending: Emergency Medicine | Admitting: Emergency Medicine

## 2013-02-08 ENCOUNTER — Encounter (HOSPITAL_COMMUNITY): Payer: Self-pay | Admitting: Emergency Medicine

## 2013-02-08 DIAGNOSIS — R042 Hemoptysis: Secondary | ICD-10-CM | POA: Insufficient documentation

## 2013-02-08 DIAGNOSIS — Z880 Allergy status to penicillin: Secondary | ICD-10-CM | POA: Insufficient documentation

## 2013-02-08 DIAGNOSIS — Z79899 Other long term (current) drug therapy: Secondary | ICD-10-CM | POA: Insufficient documentation

## 2013-02-08 DIAGNOSIS — K259 Gastric ulcer, unspecified as acute or chronic, without hemorrhage or perforation: Secondary | ICD-10-CM | POA: Insufficient documentation

## 2013-02-08 DIAGNOSIS — R091 Pleurisy: Secondary | ICD-10-CM | POA: Insufficient documentation

## 2013-02-08 DIAGNOSIS — J40 Bronchitis, not specified as acute or chronic: Secondary | ICD-10-CM | POA: Insufficient documentation

## 2013-02-08 DIAGNOSIS — F411 Generalized anxiety disorder: Secondary | ICD-10-CM | POA: Insufficient documentation

## 2013-02-08 DIAGNOSIS — K219 Gastro-esophageal reflux disease without esophagitis: Secondary | ICD-10-CM | POA: Insufficient documentation

## 2013-02-08 DIAGNOSIS — F17200 Nicotine dependence, unspecified, uncomplicated: Secondary | ICD-10-CM | POA: Insufficient documentation

## 2013-02-08 DIAGNOSIS — F319 Bipolar disorder, unspecified: Secondary | ICD-10-CM | POA: Insufficient documentation

## 2013-02-08 DIAGNOSIS — Z87828 Personal history of other (healed) physical injury and trauma: Secondary | ICD-10-CM | POA: Insufficient documentation

## 2013-02-08 DIAGNOSIS — R000 Tachycardia, unspecified: Secondary | ICD-10-CM | POA: Insufficient documentation

## 2013-02-08 DIAGNOSIS — G43909 Migraine, unspecified, not intractable, without status migrainosus: Secondary | ICD-10-CM | POA: Insufficient documentation

## 2013-02-08 DIAGNOSIS — IMO0002 Reserved for concepts with insufficient information to code with codable children: Secondary | ICD-10-CM | POA: Insufficient documentation

## 2013-02-08 DIAGNOSIS — R079 Chest pain, unspecified: Secondary | ICD-10-CM | POA: Insufficient documentation

## 2013-02-08 LAB — BASIC METABOLIC PANEL
BUN: 12 mg/dL (ref 6–23)
GFR calc Af Amer: >90 mL/min (ref >90)
GFR calc non Af Amer: >90 mL/min (ref >90)
Potassium: 3.8 mEq/L (ref 3.5–5.1)
Sodium: 140 mEq/L (ref 135–145)

## 2013-02-08 LAB — CBC WITH DIFFERENTIAL/PLATELET
Basophils Absolute: 0 10*3/uL (ref 0.0–0.1)
Basophils Relative: 0 % (ref 0–1)
Eosinophils Absolute: 0.4 10*3/uL (ref 0.0–0.7)
Lymphocytes Relative: 28 % (ref 12–46)
MCH: 31.4 pg (ref 26.0–34.0)
MCHC: 35.6 g/dL (ref 30.0–36.0)
Monocytes Relative: 6 % (ref 3–12)
Neutro Abs: 4.6 10*3/uL (ref 1.7–7.7)
Neutrophils Relative %: 61 % (ref 43–77)
RDW: 12.5 % (ref 11.5–15.5)

## 2013-02-08 LAB — D-DIMER, QUANTITATIVE: D-Dimer, Quant: <0.27 ug/mL-FEU (ref 0.00–0.48)

## 2013-02-08 IMAGING — CR DG CHEST 2V
2 series · 2 of 2 positions shown · non-contrast
Comparison: None.

CLINICAL DATA: Hemoptysis.

EXAM:
CHEST  2 VIEW

[w chest pa]
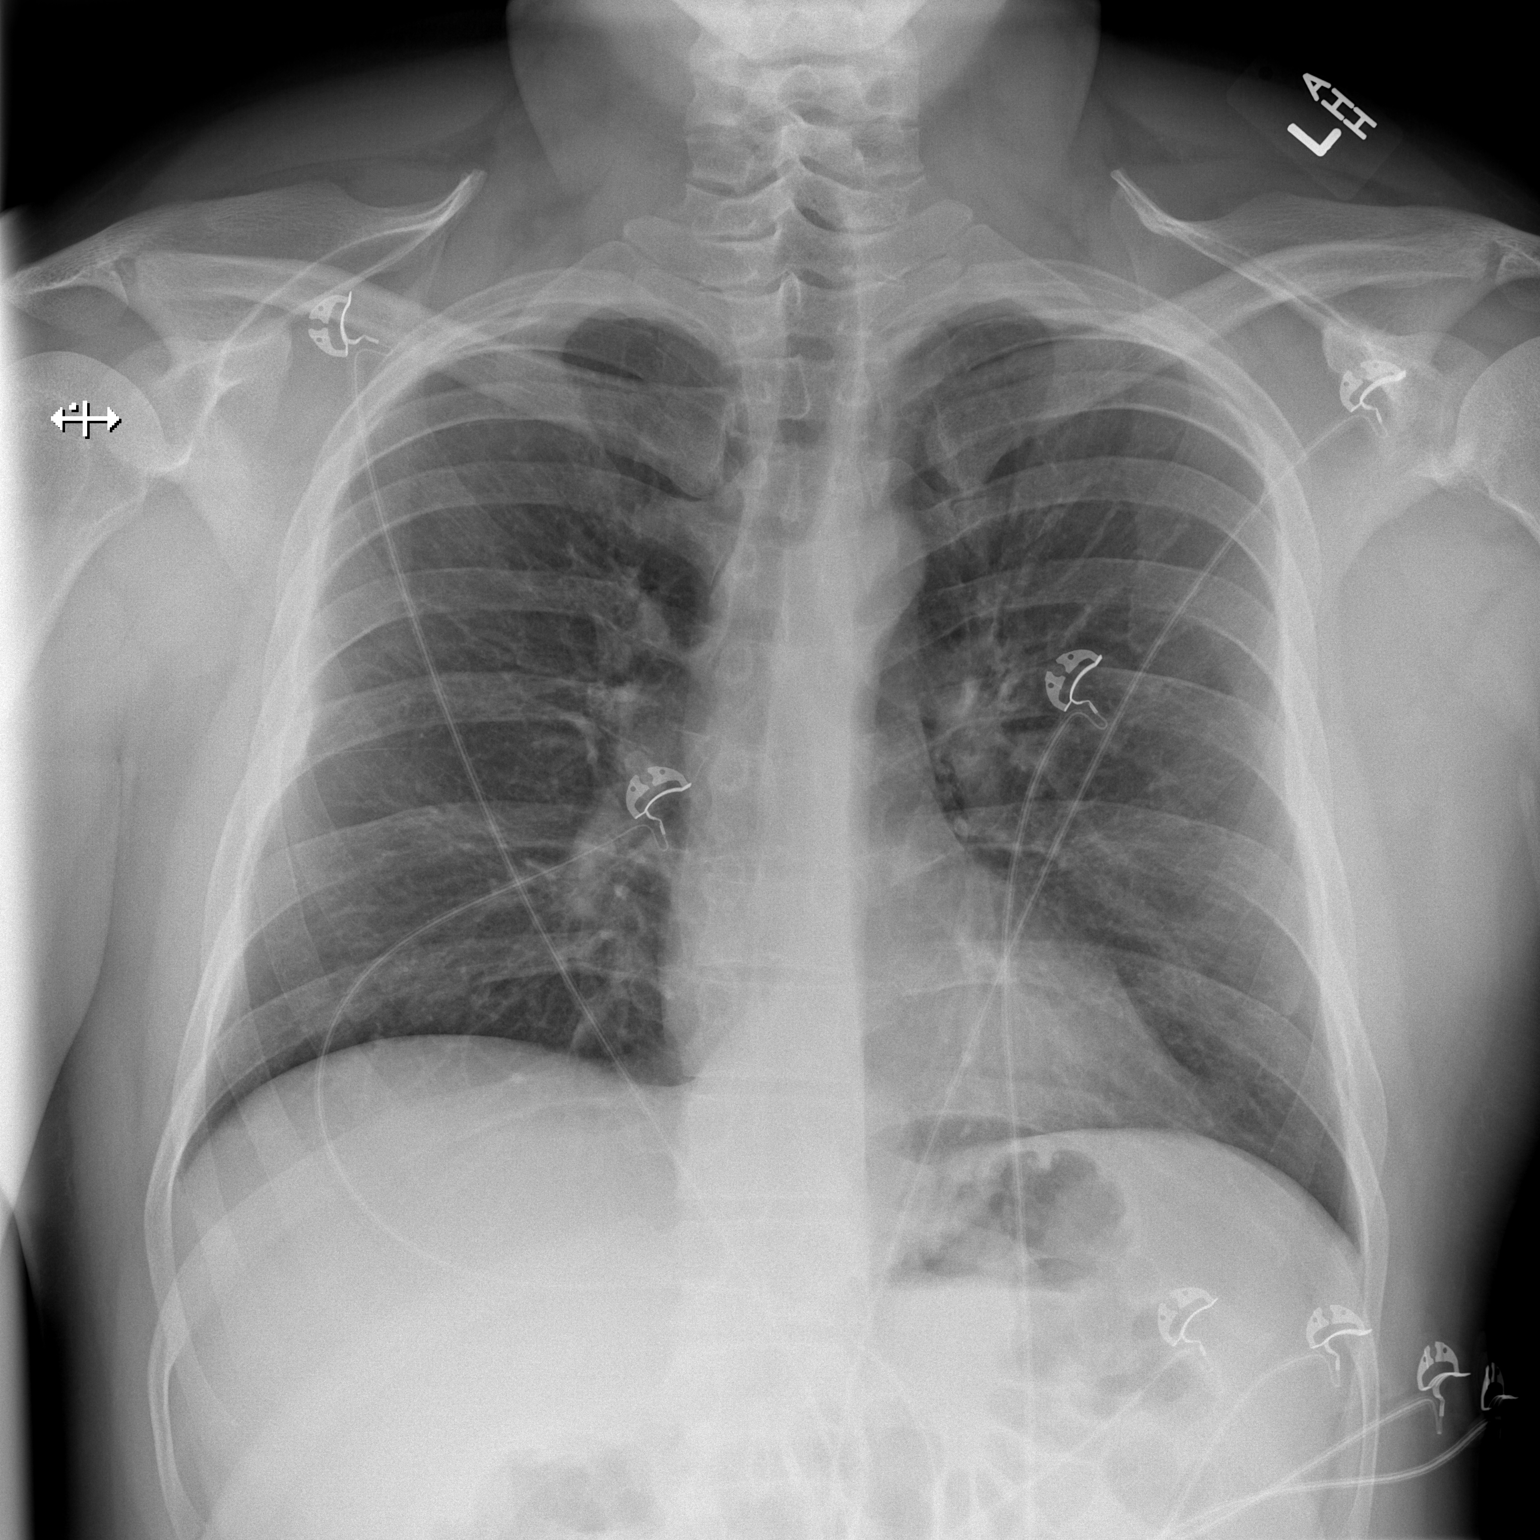

[w chest lat]
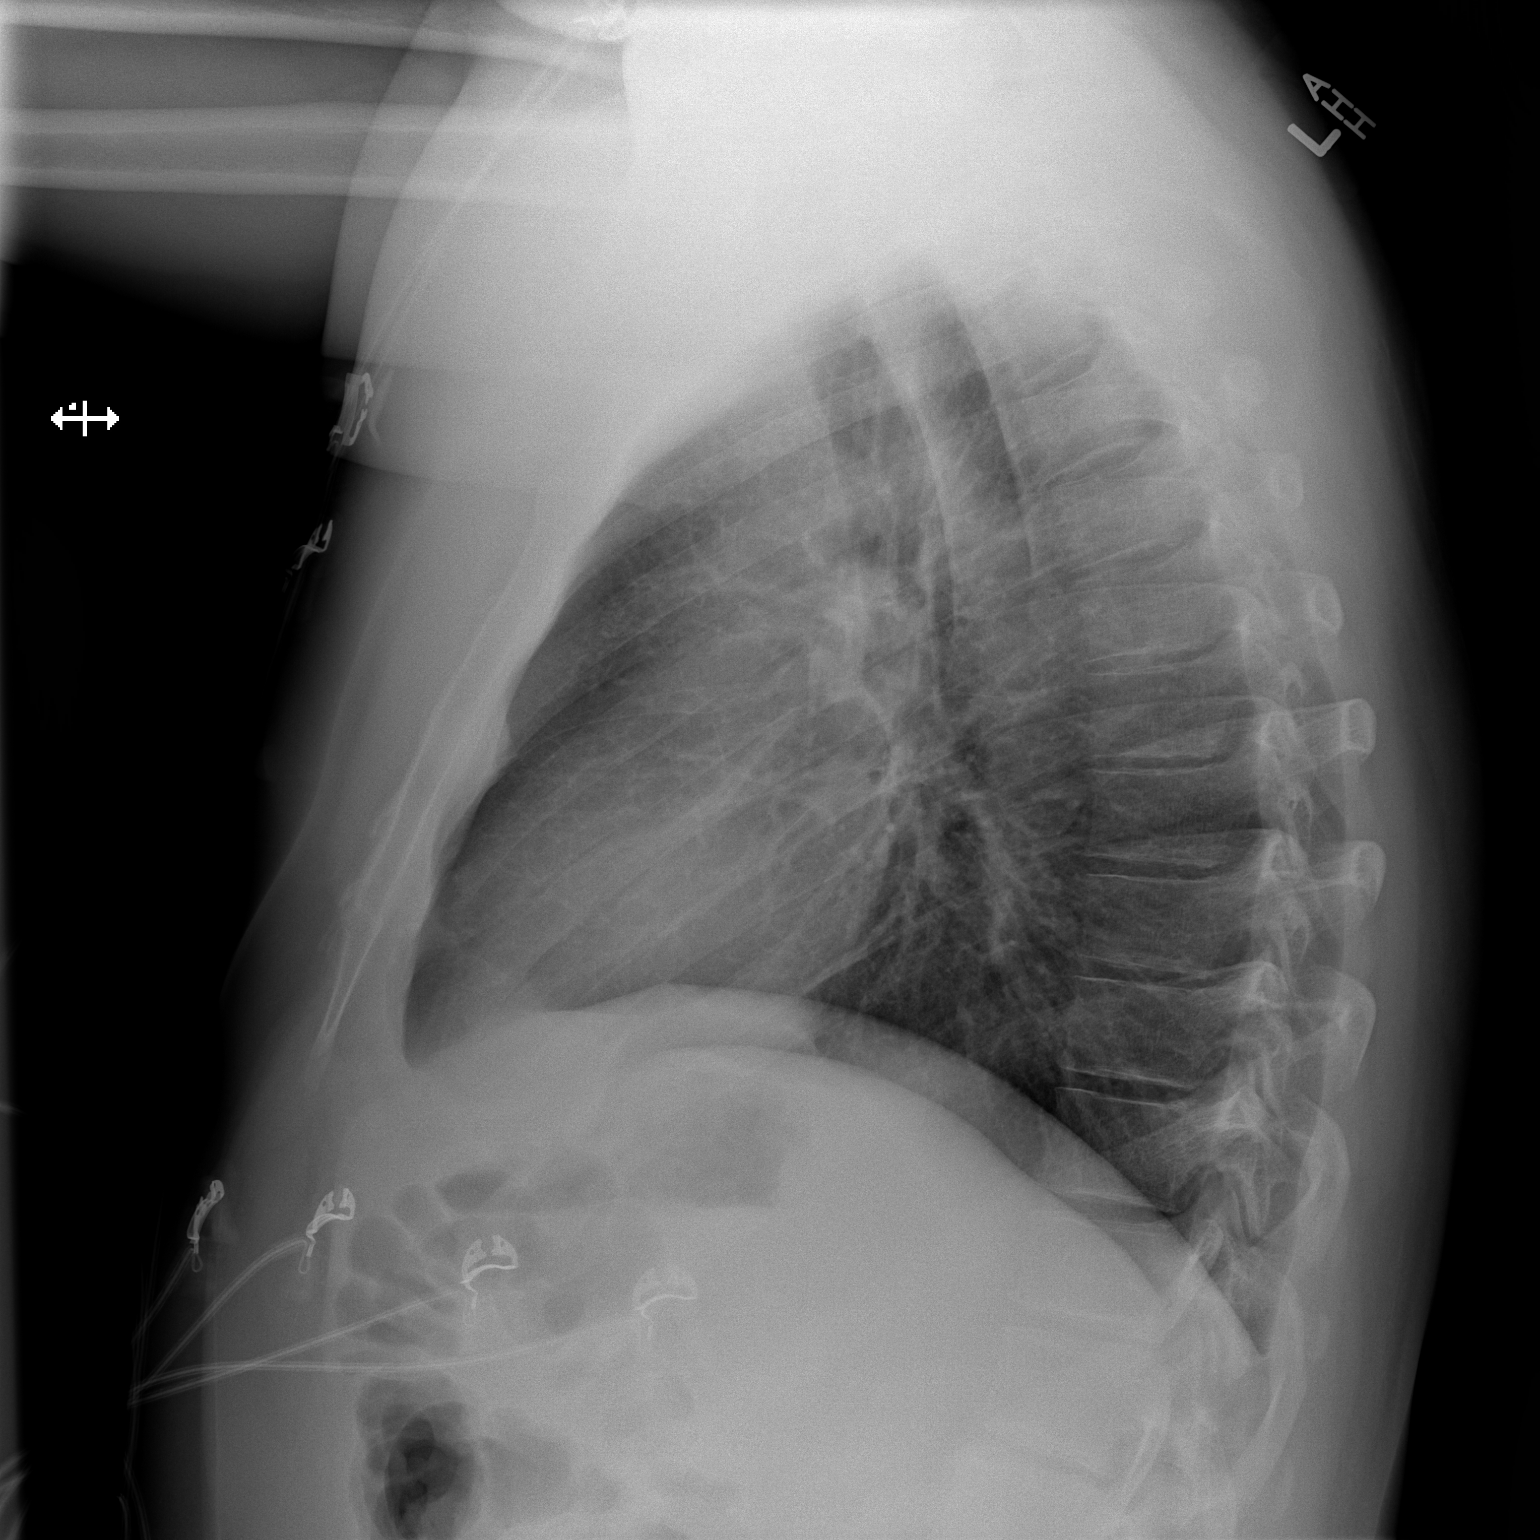

[2 of 2 positions shown; findings below may reference images not displayed]

FINDINGS: The heart size and mediastinal contours are within normal limits.
Both lungs are clear. The visualized skeletal structures are
unremarkable.
IMPRESSION: No active cardiopulmonary disease.

## 2013-02-08 MED ORDER — ALBUTEROL SULFATE HFA 108 (90 BASE) MCG/ACT IN AERS
1.0000 | INHALATION_SPRAY | Freq: Four times a day (QID) | RESPIRATORY_TRACT | Status: DC | PRN
  Administered 2013-02-08: 1 via RESPIRATORY_TRACT
  Filled 2013-02-08: qty 6.7

## 2013-02-08 MED ORDER — OXYCODONE-ACETAMINOPHEN 5-325 MG PO TABS
1.0000 | ORAL_TABLET | Freq: Once | ORAL | Status: AC
  Administered 2013-02-08: 1 via ORAL
  Filled 2013-02-08: qty 1

## 2013-02-08 MED ORDER — PREDNISONE 20 MG PO TABS: 40.0000 mg | ORAL_TABLET | Freq: Every day | ORAL | Status: DC

## 2013-02-08 MED ORDER — ALBUTEROL SULFATE (5 MG/ML) 0.5% IN NEBU
5.0000 mg | INHALATION_SOLUTION | Freq: Once | RESPIRATORY_TRACT | Status: AC
  Administered 2013-02-08: 5 mg via RESPIRATORY_TRACT
  Filled 2013-02-08: qty 1

## 2013-02-08 MED ORDER — IPRATROPIUM BROMIDE 0.02 % IN SOLN
0.5000 mg | Freq: Once | RESPIRATORY_TRACT | Status: AC
  Administered 2013-02-08: 0.5 mg via RESPIRATORY_TRACT
  Filled 2013-02-08: qty 2.5

## 2013-02-08 NOTE — ED Provider Notes (Addendum)
CSN: [id number]     Arrival date & time 02/08/13  1730 History   First MD Initiated Contact with Patient 02/08/13 1757     Chief Complaint  Patient presents with  . Shortness of Breath  . Hemoptysis   (Consider location/radiation/quality/duration/timing/severity/associated sxs/prior Treatment) Patient is a 32 y.o. male presenting with shortness of breath. The history is provided by the patient.  Shortness of Breath Severity:  Moderate Onset quality:  Gradual Duration:  3 days Timing:  Constant Progression:  Worsening Chronicity:  New Context comment:  3-4 days ago started to have a cough with some bloody sputum.  developed SOB after that started Relieved by:  Rest Worsened by:  Movement, exertion, deep breathing, activity and coughing Ineffective treatments:  Rest Associated symptoms: chest pain, cough, hemoptysis and sputum production   Associated symptoms: no abdominal pain, no fever, no vomiting and no wheezing   Risk factors: recent surgery   Risk factors: no recent alcohol use and no hx of PE/DVT   Risk factors comment:  Mass excised from finger   Past Medical History  Diagnosis Date  . Nerve pain   . Crush injury lower leg     Left lower leg  . Depression   . Migraine   . Bipolar disorder   . Allergy   . Anxiety   . Gastric ulcer   . GERD (gastroesophageal reflux disease)    Past Surgical History  Procedure Laterality Date  . Mass excision Left 01/25/2013    Procedure: EXCISION MASS DORSAL ASPECT LEFT LONG FINGER DISTAL INTERPHALANGEAL JOINT;  Surgeon: Wyn Forster., MD;  Location: [facility];  Service: Orthopedics;  Laterality: Left;  Left long    Family History  Problem Relation Age of Onset  . Hyperlipidemia Father   . Hypertension Father   . Cancer Paternal Grandfather     lung, colon  . Stroke    . Heart disease    . Diabetes Paternal Grandmother   . Cancer Paternal Grandmother   . Cancer Maternal Grandmother   . Cancer  Maternal Grandfather   . Depression Paternal Aunt   . Anxiety disorder Paternal Aunt   . Anxiety disorder Father   . Drug abuse Father   . Drug abuse Paternal Uncle    History  Substance Use Topics  . Smoking status: Current Every Day Smoker -- 1.00 packs/day  . Smokeless tobacco: Never Used  . Alcohol Use: Yes     Comment: social    Review of Systems  Constitutional: Negative for fever.  Respiratory: Positive for cough, hemoptysis, sputum production and shortness of breath. Negative for wheezing.   Cardiovascular: Positive for chest pain.  Gastrointestinal: Negative for vomiting and abdominal pain.  All other systems reviewed and are negative.    Allergies  Aspirin; Codeine; Penicillins; Tramadol; and Amitriptyline  Home Medications   Current Outpatient Rx  Name  Route  Sig  Dispense  Refill  . fentaNYL (DURAGESIC - DOSED MCG/HR) 25 MCG/HR patch   Transdermal   Place 1 patch onto the skin every 3 (three) days.         Marland Kitchen gabapentin (NEURONTIN) 300 MG capsule   Oral   Take 600 mg by mouth 3 (three) times daily.          Marland Kitchen ibuprofen (ADVIL,MOTRIN) 200 MG tablet   Oral   Take 600 mg by mouth every 8 (eight) hours as needed (leg pain).         . Multiple  Vitamin (MULTIVITAMIN) tablet   Oral   Take 1 tablet by mouth daily.         . nortriptyline (PAMELOR) 75 MG capsule   Oral   Take 1 capsule (75 mg total) by mouth at bedtime.   30 capsule   3   . omeprazole (PRILOSEC) 20 MG capsule   Oral   Take 20 mg by mouth 3 (three) times daily.         . pantoprazole (PROTONIX) 40 MG tablet   Oral   Take 40 mg by mouth 2 (two) times daily.         . ranitidine (ZANTAC) 150 MG capsule   Oral   Take 150 mg by mouth 2 (two) times daily as needed (acid reflux).           BP 146/91  Pulse 108  Temp(Src) 98.9 F (37.2 C) (Oral)  Resp 20  SpO2 100% Physical Exam  Nursing note and vitals reviewed. Constitutional: He is oriented to person, place, and  time. He appears well-developed and well-nourished. No distress.  HENT:  Head: Normocephalic and atraumatic.  Mouth/Throat: Oropharynx is clear and moist.  Eyes: Conjunctivae and EOM are normal. Pupils are equal, round, and reactive to light.  Neck: Normal range of motion. Neck supple.  Cardiovascular: Regular rhythm and intact distal pulses.  Tachycardia present.   No murmur heard. Pulmonary/Chest: Effort normal and breath sounds normal. No respiratory distress. He has no wheezes. He has no rales. He exhibits no tenderness.  Abdominal: Soft. He exhibits no distension. There is no tenderness. There is no rebound and no guarding.  Musculoskeletal: Normal range of motion. He exhibits no edema and no tenderness.  Neurological: He is alert and oriented to person, place, and time.  Skin: Skin is warm and dry. No rash noted. No erythema.  Psychiatric: He has a normal mood and affect. His behavior is normal.    ED Course  Procedures (including critical care time) Labs Review Labs Reviewed  CBC WITH DIFFERENTIAL - Abnormal; Notable for the following:    Hemoglobin 17.1 (*)    All other components within normal limits  BASIC METABOLIC PANEL  D-DIMER, QUANTITATIVE   Imaging Review Dg Chest 2 View  02/08/2013   CLINICAL DATA:  Hemoptysis.  EXAM: CHEST  2 VIEW  COMPARISON:  None.  FINDINGS: The heart size and mediastinal contours are within normal limits. Both lungs are clear. The visualized skeletal structures are unremarkable.  IMPRESSION: No active cardiopulmonary disease.   Electronically Signed   By: Roque Lias M.D.   On: 02/08/2013 18:46    EKG Interpretation    Date/Time:  Tuesday February 08 2013 18:31:36 EST Ventricular Rate:  103 PR Interval:  135 QRS Duration: 92 QT Interval:  319 QTC Calculation: 417 R Axis:   47 Text Interpretation:  Sinus tachycardia Borderline T abnormalities, anterior leads No previous tracing Confirmed by Anitra Lauth  MD, Adlean Hardeman ([id number]) on 02/08/2013  6:39:52 PM            MDM   1. Pleurisy   2. Bronchitis     Patient with symptoms concerning for PE as he has had chest pain or hemoptysis for the last few days now developing shortness of breath. He denies any risk factors but does state that he's been taking a lot of ibuprofen lately. He denies any hematemesis. Pain is not reproducible and does not sound cardiac in nature. EKG showed sinus tachycardia but otherwise normal EKG.  Chest x-ray  was within normal limits. CBC, BMP, d-dimer pending.  9:22 PM CXR wnl.  All labs including dimer is neg.  Most likely pleurisy vs bronchitis.  After albuterol/atrovent  10:15 PM Pt improved tachycardia and sx after breathing treatment.  D/ced home with inhaler and steroids.  Gwyneth Sprout, MD 02/08/13 1610  Gwyneth Sprout, MD 02/08/13 2218

## 2013-02-08 NOTE — ED Notes (Signed)
Pt c/o of cough, SOB. States that he has been coughing up blood since 430am. Denies recent travel out of the country. Denies chest pain.

## 2013-03-21 ENCOUNTER — Ambulatory Visit: Payer: 59

## 2013-03-21 ENCOUNTER — Ambulatory Visit (INDEPENDENT_AMBULATORY_CARE_PROVIDER_SITE_OTHER): Payer: 59 | Admitting: Physician Assistant

## 2013-03-21 VITALS — BP 122/84 | HR 106 | Temp 99.1°F | Resp 18 | Wt 244.0 lb

## 2013-03-21 DIAGNOSIS — R059 Cough, unspecified: Secondary | ICD-10-CM

## 2013-03-21 DIAGNOSIS — R05 Cough: Secondary | ICD-10-CM

## 2013-03-21 LAB — POCT CBC
Granulocyte percent: 72.3 %G (ref 37–80)
HCT, POC: 45.1 % (ref 43.5–53.7)
Hemoglobin: 14.2 g/dL (ref 14.1–18.1)
Lymph, poc: 1.3 (ref 0.6–3.4)
MCH, POC: 30.5 pg (ref 27–31.2)
MCHC: 31.5 g/dL — AB (ref 31.8–35.4)
POC Granulocyte: 4.7 (ref 2–6.9)
WBC: 6.5 10*3/uL (ref 4.6–10.2)

## 2013-03-21 IMAGING — CR DG CHEST 2V
2 series · 2 of 2 positions shown · non-contrast
Comparison: [DATE]

CLINICAL DATA: Cough

EXAM:
CHEST  2 VIEW

[PA]
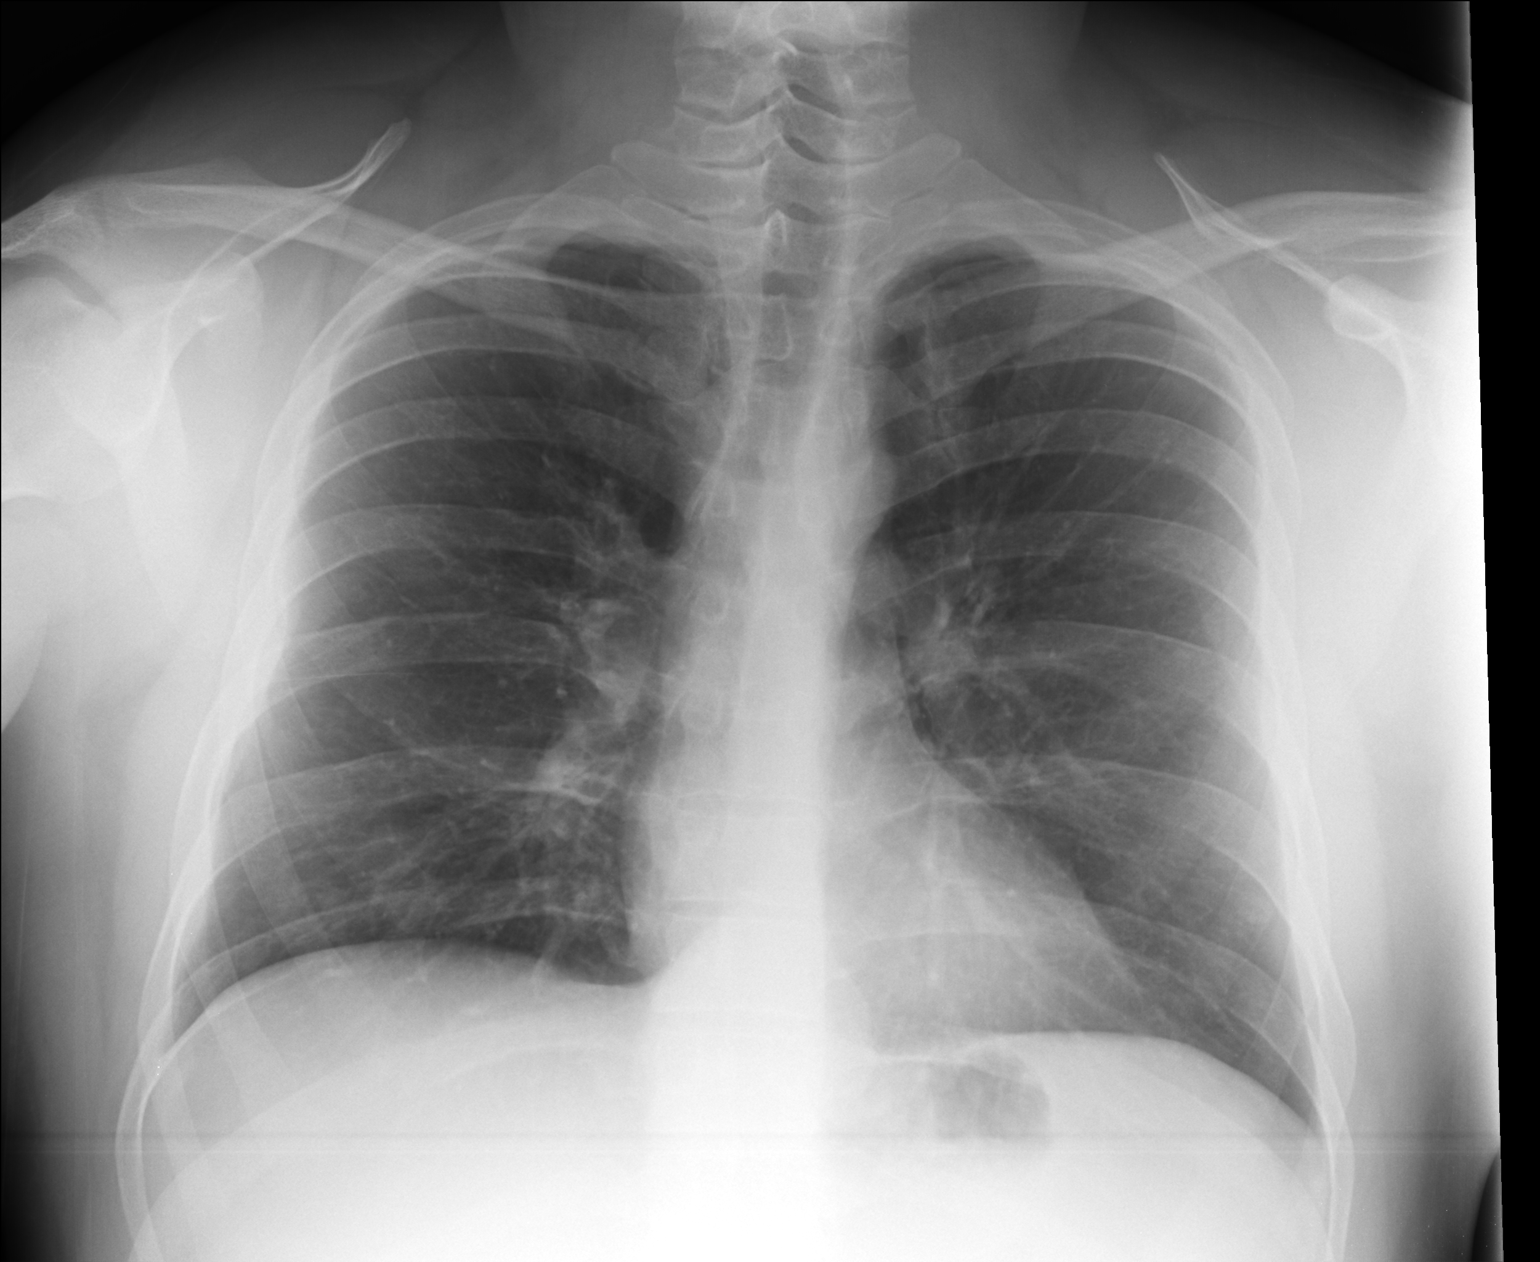

[lateral]
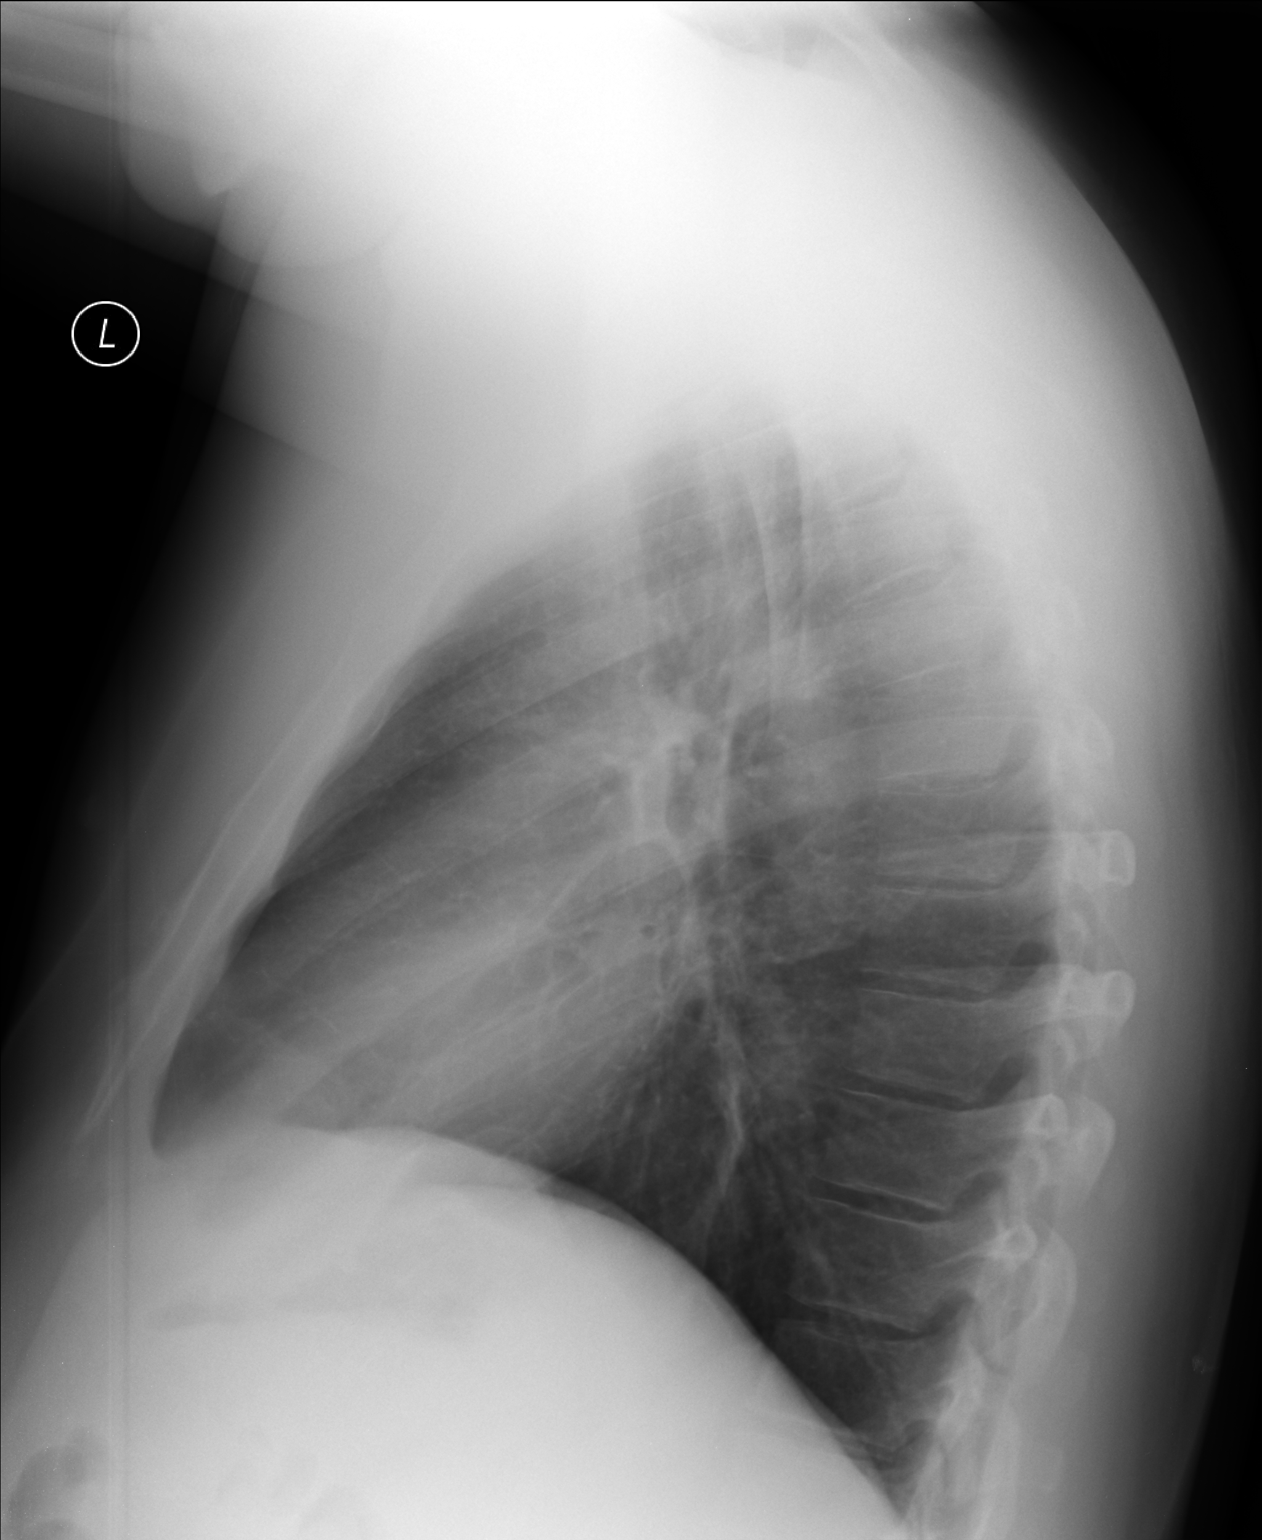

[2 of 2 positions shown; findings below may reference images not displayed]

FINDINGS: The heart size and mediastinal contours are within normal limits.
Both lungs are clear. The visualized skeletal structures are
unremarkable.
IMPRESSION: No active cardiopulmonary disease.

## 2013-03-21 MED ORDER — IPRATROPIUM BROMIDE 0.03 % NA SOLN: 2.0000 | Freq: Two times a day (BID) | NASAL | Status: DC

## 2013-03-21 MED ORDER — DOXYCYCLINE HYCLATE 100 MG PO CAPS: 100.0000 mg | ORAL_CAPSULE | Freq: Two times a day (BID) | ORAL | Status: DC

## 2013-03-21 MED ORDER — ALBUTEROL SULFATE HFA 108 (90 BASE) MCG/ACT IN AERS: 2.0000 | INHALATION_SPRAY | RESPIRATORY_TRACT | Status: DC | PRN

## 2013-03-21 NOTE — Patient Instructions (Signed)
Get plenty of rest and drink at least 64 ounces of water daily. Quit smoking. 

## 2013-03-21 NOTE — Progress Notes (Signed)
Subjective:    Patient ID: Shane Landry., male    DOB: [birth year], 32 y.o.   MRN: [id number]  PCP: Thomos Lemons, DO  Chief Complaint  Patient presents with  . URI  . Cough   Medications, allergies, past medical history, surgical history, family history, social history and problem list reviewed and updated.  HPI  Congestion and cough x 2 days, getting worse.  Feels like early bronchitis, which he gets 4-5 times each year. Cough comes in fits, gets dizzy and loses breath.  Non-productive. Keep him awake. OTC products without benefit.  No fever/chills, GI/GU symptoms. No unexplained muscle or joint aches.  No nasal congestion, ear fullness or pain.  Smoker.  Wants to quit.  Tried cold-turkey, nicotine patches (local reaction), gum (tastes like crap) without benefit. Down to 1/2 PPD from 2 PPD.  Wife recently had bronchitis.  No flu vaccine this season. "I actually like getting the flu."  Review of Systems As above.    Objective:   Physical Exam  Blood pressure 122/84, pulse 106, temperature 99.1 F (37.3 C), temperature source Oral, resp. rate 18, weight 244 lb (110.678 kg), SpO2 96.00%. Body mass index is 34.05 kg/(m^2). Well-developed, well nourished WM who is awake, alert and oriented, in NAD. HEENT: [place]/AT, PERRL, EOMI.  Sclera and conjunctiva are clear.  EAC are patent, TMs are normal in appearance. Nasal mucosa is pink and moist. OP is clear. Neck: supple, non-tender, no lymphadenopathy, thyromegaly. Heart: RRR, no murmur Lungs: normal effort, CTA, though a little coarse. Extremities: no cyanosis, clubbing or edema. Skin: warm and dry without rash. Psychologic: good mood and appropriate affect, normal speech and behavior.  CXR: [facility] reading (PRIMARY) by  Dr. Dareen Piano. Increased markings, but without discrete infiltrates. No pneumothorax.  Results for orders placed in visit on 03/21/13 (from the past 24 hour(s))  POCT CBC     Status: Abnormal   Collection Time    03/21/13  7:29 PM      Result Value Range   WBC 6.5  4.6 - 10.2 K/uL   Lymph, poc 1.3  0.6 - 3.4   POC LYMPH PERCENT 20.3  10 - 50 %L   MID (cbc) 0.5  0 - 0.9   POC MID % 7.4  0 - 12 %M   POC Granulocyte 4.7  2 - 6.9   Granulocyte percent 72.3  37 - 80 %G   RBC 4.65 (*) 4.69 - 6.13 M/uL   Hemoglobin 14.2  14.1 - 18.1 g/dL   HCT, POC 40.9  81.1 - 53.7 %   MCV 96.9  80 - 97 fL   MCH, POC 30.5  27 - 31.2 pg   MCHC 31.5 (*) 31.8 - 35.4 g/dL   RDW, POC 91.4     Platelet Count, POC 204  142 - 424 K/uL   MPV 8.1  0 - 99.8 fL       Assessment & Plan:  1. Cough Encouraged smoking cessation. Cover for AECB with doxycycline. - POCT CBC - DG Chest 2 View; Future - ipratropium (ATROVENT) 0.03 % nasal spray; Place 2 sprays into both nostrils 2 (two) times daily.  Dispense: 30 mL; Refill: 0 - albuterol (PROVENTIL HFA;VENTOLIN HFA) 108 (90 BASE) MCG/ACT inhaler; Inhale 2 puffs into the lungs every 4 (four) hours as needed for wheezing or shortness of breath (cough, shortness of breath or wheezing.).  Dispense: 1 Inhaler; Refill: 1 - doxycycline (VIBRAMYCIN) 100 MG capsule; Take 1 capsule (100 mg  total) by mouth 2 (two) times daily.  Dispense: 20 capsule; Refill: 0   Fernande Bras, PA-C Physician Assistant-Certified [facility]

## 2013-03-22 ENCOUNTER — Telehealth: Payer: Self-pay

## 2013-03-22 MED ORDER — BENZONATATE 100 MG PO CAPS: 100.0000 mg | ORAL_CAPSULE | Freq: Three times a day (TID) | ORAL | Status: DC | PRN

## 2013-03-22 NOTE — Telephone Encounter (Signed)
Meds ordered this encounter  Medications  . benzonatate (TESSALON) 100 MG capsule    Sig: Take 1-2 capsules (100-200 mg total) by mouth 3 (three) times daily as needed for cough.    Dispense:  40 capsule    Refill:  0    Order Specific Question:  Supervising Provider    Answer:  DOOLITTLE, ROBERT P [[id number]]    

## 2013-03-22 NOTE — Telephone Encounter (Signed)
PATIENT STATES HE SAW CHELLE J Monday FOR BRONCHITIS. SHE PRESCRIBED HIM AN ALBUTEROL INHALER, ANTIBIOTIC, AND A NASAY SPRAY. HOWEVER, HE DID NOT GET ANYTHING FOR HIS COUGH. HE WOULD LIKE TO GET SOMETHING CALLED IN FOR THAT. BEST PHONE [phone] (CELL)   PHARMACY CHOICE IS [facility].  MBC

## 2013-03-23 NOTE — Telephone Encounter (Signed)
Called patient to advise  °

## 2013-06-16 ENCOUNTER — Ambulatory Visit (INDEPENDENT_AMBULATORY_CARE_PROVIDER_SITE_OTHER): Payer: 59 | Admitting: Emergency Medicine

## 2013-06-16 ENCOUNTER — Ambulatory Visit: Payer: 59

## 2013-06-16 VITALS — BP 120/88 | HR 122 | Temp 98.3°F | Resp 18 | Ht 70.0 in | Wt 240.0 lb

## 2013-06-16 DIAGNOSIS — S99922A Unspecified injury of left foot, initial encounter: Secondary | ICD-10-CM

## 2013-06-16 DIAGNOSIS — S8990XA Unspecified injury of unspecified lower leg, initial encounter: Secondary | ICD-10-CM

## 2013-06-16 DIAGNOSIS — S92302A Fracture of unspecified metatarsal bone(s), left foot, initial encounter for closed fracture: Secondary | ICD-10-CM

## 2013-06-16 DIAGNOSIS — M79609 Pain in unspecified limb: Secondary | ICD-10-CM

## 2013-06-16 DIAGNOSIS — S99929A Unspecified injury of unspecified foot, initial encounter: Secondary | ICD-10-CM

## 2013-06-16 DIAGNOSIS — S99919A Unspecified injury of unspecified ankle, initial encounter: Secondary | ICD-10-CM

## 2013-06-16 IMAGING — CR DG FOOT COMPLETE 3+V*L*
2 series · 2 of 2 positions shown · non-contrast
Comparison: [DATE].

CLINICAL DATA: Left foot injury.

EXAM:
LEFT FOOT - COMPLETE 3+ VIEW

[AP]
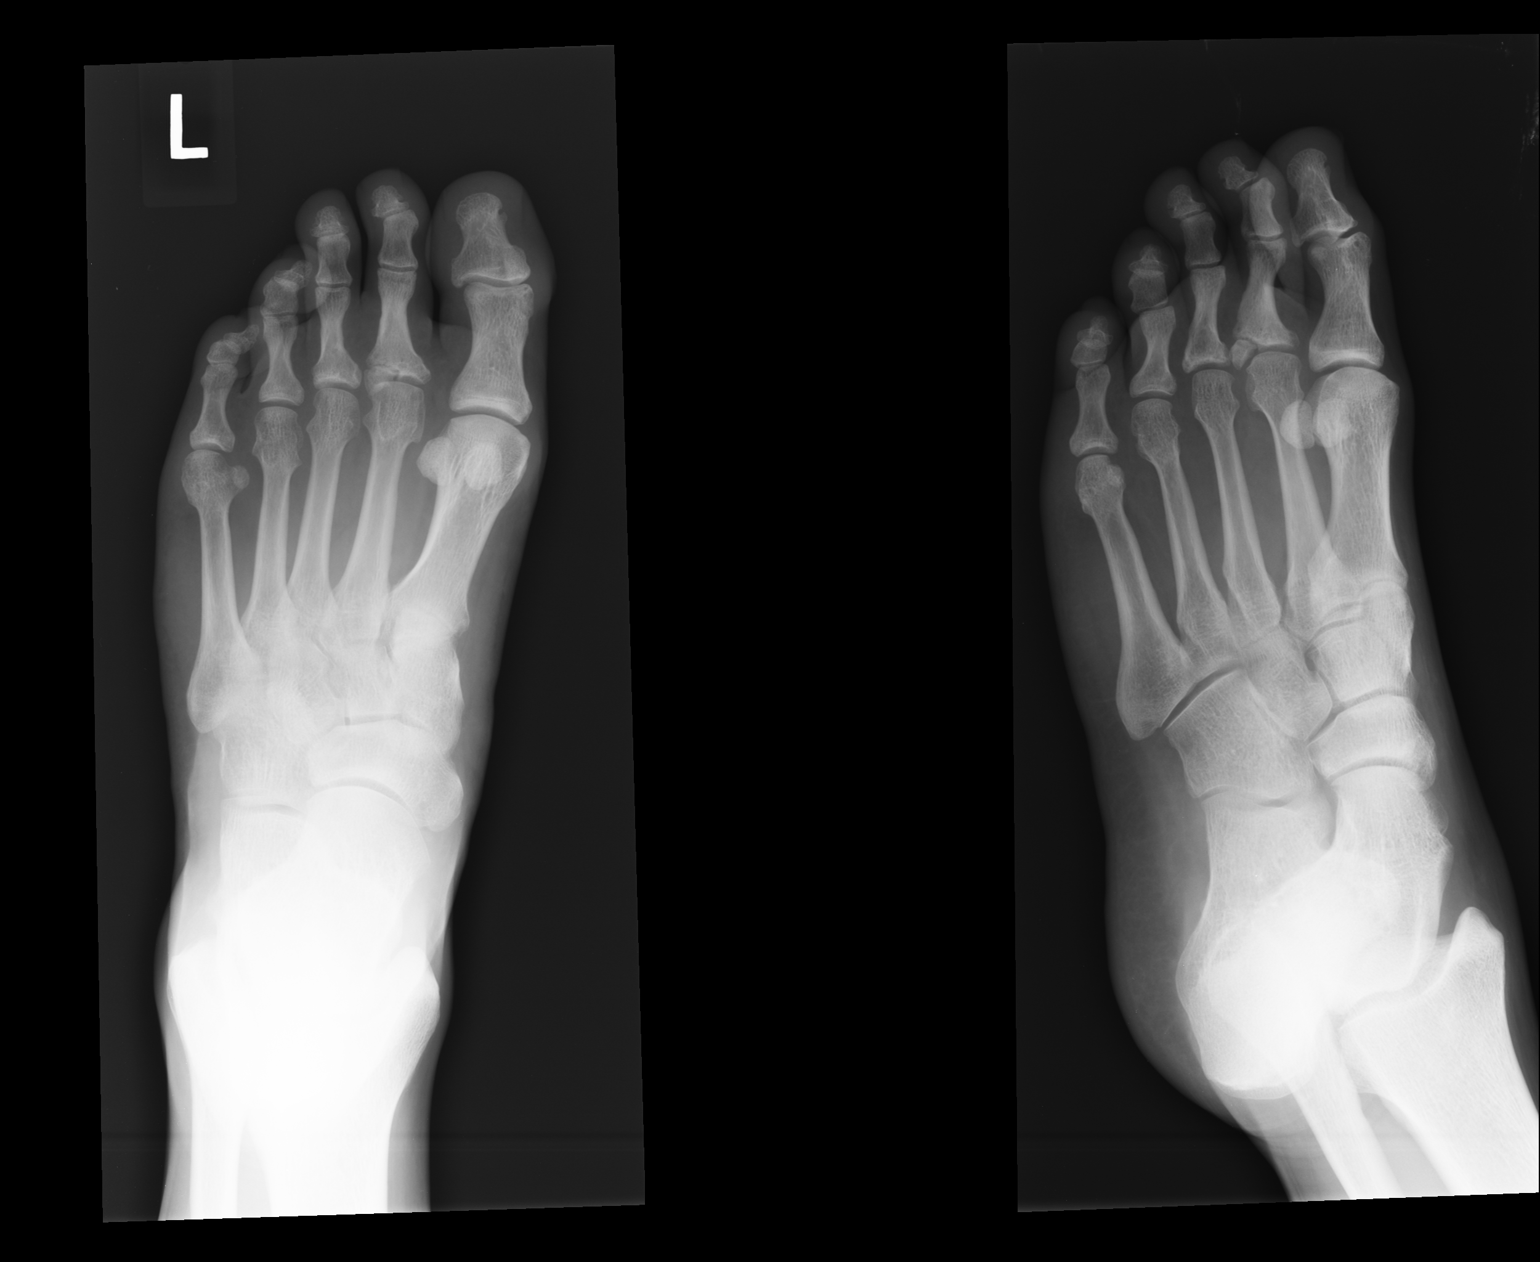

[ap obl int rot]
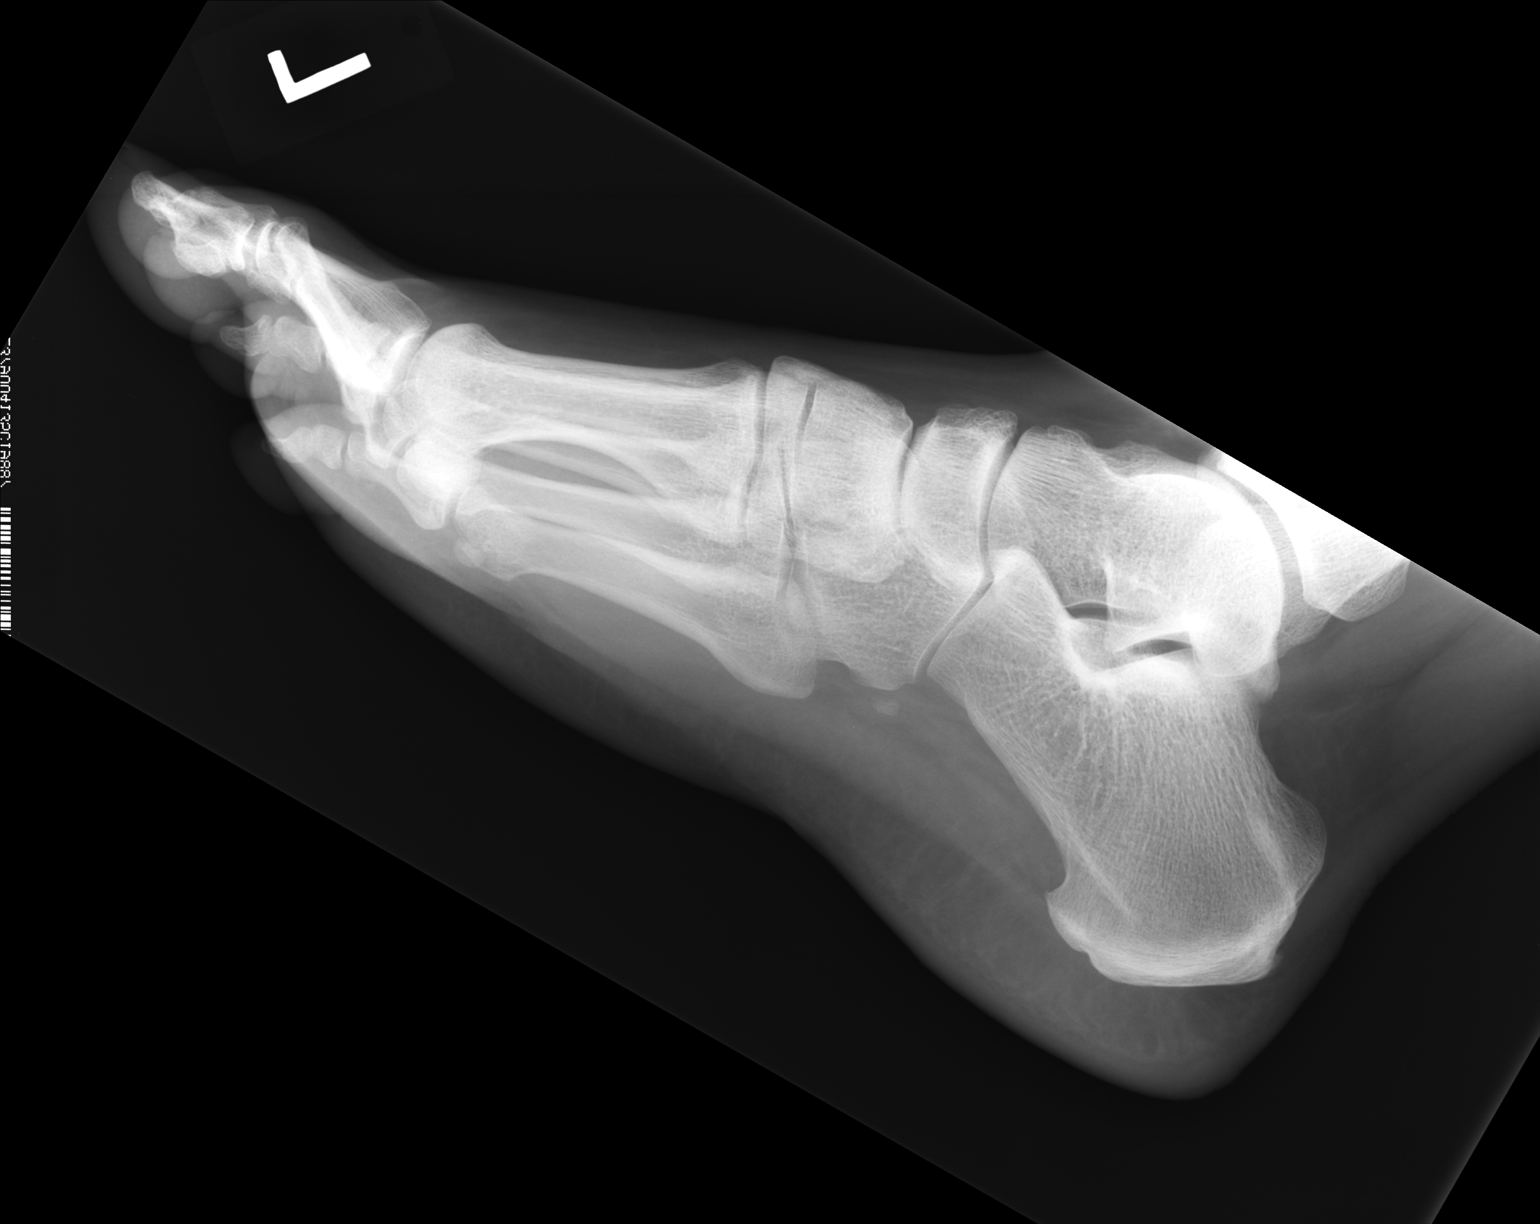

[2 of 2 positions shown; findings below may reference images not displayed]

FINDINGS: There is again noted an old fracture involving the proximal base of
the second proximal phalanx which is unchanged compared to prior
exam. No acute fracture or dislocation is noted. Joint spaces are
intact. No soft tissue abnormality is noted.
IMPRESSION: Stable old ununited fracture of proximal base of second proximal
phalanx. No acute fracture or dislocation is noted.

## 2013-06-16 MED ORDER — HYDROCODONE-ACETAMINOPHEN 5-325 MG PO TABS: 1.0000 | ORAL_TABLET | Freq: Four times a day (QID) | ORAL | Status: DC | PRN

## 2013-06-16 MED ORDER — MELOXICAM 15 MG PO TABS: 15.0000 mg | ORAL_TABLET | Freq: Every day | ORAL | Status: DC

## 2013-06-16 MED ORDER — HYDROCODONE-ACETAMINOPHEN 5-325 MG PO TABS: ORAL_TABLET | ORAL | Status: DC

## 2013-06-16 NOTE — Progress Notes (Signed)
   Subjective:    Patient ID: Shane LandryPaul D Bisceglia Jr., male    DOB: 01/03/1981, 33 y.o.   MRN: [id number]  HPI Patient with history of old injury/fracture of left foot. Requires gabapentin and nortriptyline for pain. Was helping sister move yesterday when he dropped a sofa on his left foot. He then pulled on sofa while it rested on top of his foot. Has had pain with flexion of toes. Applied ice, took ibuprofen 800 mg q 4-6 hours with minimal relief.   Patient sees Dr. Artist PaisYoo for primary care and attends a pain clinic for treatment of neuropathy. Noted 30 pound weight gain in last year. Patient reports that he was out of work for 6 months and had significantly decreased activity. Has been unable to walk/run and has been limited to upper body weight training and stretching.   Review of Systems No numbness or tingling. Swelling worse last night.     Objective:   Physical Exam  Vitals reviewed. Constitutional: He is oriented to person, place, and time. He appears well-developed and well-nourished. No distress.  HENT:  Head: Normocephalic and atraumatic.  Right Ear: External ear normal.  Left Ear: External ear normal.  Eyes: Conjunctivae are normal. Right eye exhibits no discharge. Left eye exhibits no discharge.  Neck: Normal range of motion.  Pulmonary/Chest: Effort normal.  Musculoskeletal:  Left foot with full ROM, DP/PT palpable, brisk cap refill. Bilateral lower extremities slightly red and mottled. Skin warm. Pain with flexion and extension of toes. Tender over PIP/metatarsal area 2 nd digit.   Neurological: He is alert and oriented to person, place, and time.  Skin: Skin is warm and dry. He is not diaphoretic.  Psychiatric: He has a normal mood and affect. His behavior is normal. Judgment and thought content normal.   XRAY left foot  [facility] reading (PRIMARY) by  Dr. Dareen PianoAnderson- intraarticular fracture of second proximal metatarsal.      Assessment & Plan:  1. Injury of foot, left - DG  Foot Complete Left; Future - meloxicam (MOBIC) 15 MG tablet; Take 1 tablet (15 mg total) by mouth daily.  Dispense: 30 tablet; Refill: 0 - HYDROcodone-acetaminophen (NORCO) 5-325 MG per tablet; Take 1 tablet by mouth every 12 hours as needed for severe pain  Dispense: 10 tablet; Refill: 0  2. Fracture of metatarsal bone of left foot - meloxicam (MOBIC) 15 MG tablet; Take 1 tablet (15 mg total) by mouth daily.  Dispense: 30 tablet; Refill: 0 - HYDROcodone-acetaminophen (NORCO) 5-325 MG per tablet; Take 1 tablet by mouth every 12 hours as needed for severe pain  Dispense: 10 tablet; Refill: 0 -Short Cam walker applied, patient to wear while awake.  -Refer to ortho. -discussed with Dr. Johnette AbrahamAnderson  Kensli Bowley B. Avree Szczygiel, FNP-BC  [facility]  06/16/2013 4:20 PM

## 2013-06-16 NOTE — Patient Instructions (Signed)
Wear cam boot while awake. Our office will call you with an appointment with orthopedic specialist Take meloxicam every day for pain (long lasting anti-inflammatory) Take norco every 12 hours as needed for severe pain

## 2013-06-21 ENCOUNTER — Telehealth: Payer: Self-pay

## 2013-06-21 DIAGNOSIS — M25579 Pain in unspecified ankle and joints of unspecified foot: Secondary | ICD-10-CM

## 2013-06-21 NOTE — Telephone Encounter (Signed)
Please call the patient and see if he has improvement in pain. Would be best to control with Meloxicam and acetaminophen while waiting on appointment with foot specialist.

## 2013-06-21 NOTE — Telephone Encounter (Signed)
Dr. [facility] is the provider we would like the pt to go to if possible.

## 2013-06-21 NOTE — Telephone Encounter (Signed)
Pt is checking on status of referral to an orthopedic provider which is not in sys and is needing to talk with someone about pain medication refill  Best number [phone]

## 2013-06-21 NOTE — Telephone Encounter (Signed)
Entered referral for Ortho- Pt needs refill on Hydrocodone until appt with Ortho-

## 2013-06-21 NOTE — Telephone Encounter (Signed)
LM for pt to rtn call- working on ortho appt, take meloxicam and tylenol to control pain.

## 2013-06-21 NOTE — Telephone Encounter (Signed)
Pt has been advised and is awaiting referral information.

## 2013-08-01 ENCOUNTER — Encounter (HOSPITAL_COMMUNITY): Payer: Self-pay | Admitting: Emergency Medicine

## 2013-08-01 ENCOUNTER — Emergency Department (HOSPITAL_COMMUNITY): Payer: 59

## 2013-08-01 ENCOUNTER — Emergency Department (HOSPITAL_COMMUNITY)
Admission: EM | Admit: 2013-08-01 | Discharge: 2013-08-01 | Disposition: A | Discharge status: Home / Self Care | Payer: 59 | Attending: Emergency Medicine | Admitting: Emergency Medicine

## 2013-08-01 DIAGNOSIS — G8929 Other chronic pain: Secondary | ICD-10-CM | POA: Insufficient documentation

## 2013-08-01 DIAGNOSIS — R112 Nausea with vomiting, unspecified: Secondary | ICD-10-CM | POA: Insufficient documentation

## 2013-08-01 DIAGNOSIS — Z880 Allergy status to penicillin: Secondary | ICD-10-CM | POA: Insufficient documentation

## 2013-08-01 DIAGNOSIS — R197 Diarrhea, unspecified: Secondary | ICD-10-CM | POA: Insufficient documentation

## 2013-08-01 DIAGNOSIS — G40909 Epilepsy, unspecified, not intractable, without status epilepticus: Secondary | ICD-10-CM | POA: Insufficient documentation

## 2013-08-01 DIAGNOSIS — F411 Generalized anxiety disorder: Secondary | ICD-10-CM | POA: Insufficient documentation

## 2013-08-01 DIAGNOSIS — Z79899 Other long term (current) drug therapy: Secondary | ICD-10-CM | POA: Insufficient documentation

## 2013-08-01 DIAGNOSIS — M79609 Pain in unspecified limb: Secondary | ICD-10-CM | POA: Insufficient documentation

## 2013-08-01 DIAGNOSIS — F319 Bipolar disorder, unspecified: Secondary | ICD-10-CM | POA: Insufficient documentation

## 2013-08-01 DIAGNOSIS — F17200 Nicotine dependence, unspecified, uncomplicated: Secondary | ICD-10-CM | POA: Insufficient documentation

## 2013-08-01 DIAGNOSIS — Z87828 Personal history of other (healed) physical injury and trauma: Secondary | ICD-10-CM | POA: Insufficient documentation

## 2013-08-01 DIAGNOSIS — K921 Melena: Secondary | ICD-10-CM | POA: Insufficient documentation

## 2013-08-01 DIAGNOSIS — R109 Unspecified abdominal pain: Secondary | ICD-10-CM

## 2013-08-01 DIAGNOSIS — R52 Pain, unspecified: Secondary | ICD-10-CM | POA: Insufficient documentation

## 2013-08-01 DIAGNOSIS — R1031 Right lower quadrant pain: Secondary | ICD-10-CM | POA: Insufficient documentation

## 2013-08-01 DIAGNOSIS — K219 Gastro-esophageal reflux disease without esophagitis: Secondary | ICD-10-CM | POA: Insufficient documentation

## 2013-08-01 LAB — CBC WITH DIFFERENTIAL/PLATELET
BASOS PCT: 0 % (ref 0–1)
Basophils Absolute: 0 10*3/uL (ref 0.0–0.1)
EOS ABS: 0.3 10*3/uL (ref 0.0–0.7)
Eosinophils Relative: 4 % (ref 0–5)
HCT: 45.9 % (ref 39.0–52.0)
HEMOGLOBIN: 15.8 g/dL (ref 13.0–17.0)
Lymphocytes Relative: 27 % (ref 12–46)
Lymphs Abs: 2.1 10*3/uL (ref 0.7–4.0)
MCH: 30.7 pg (ref 26.0–34.0)
MCHC: 34.4 g/dL (ref 30.0–36.0)
MCV: 89.3 fL (ref 78.0–100.0)
MONOS PCT: 7 % (ref 3–12)
Monocytes Absolute: 0.5 10*3/uL (ref 0.1–1.0)
NEUTROS ABS: 4.8 10*3/uL (ref 1.7–7.7)
NEUTROS PCT: 62 % (ref 43–77)
PLATELETS: 244 10*3/uL (ref 150–400)
RBC: 5.14 MIL/uL (ref 4.22–5.81)
RDW: 12.9 % (ref 11.5–15.5)
WBC: 7.8 10*3/uL (ref 4.0–10.5)

## 2013-08-01 LAB — URINALYSIS, ROUTINE W REFLEX MICROSCOPIC
BILIRUBIN URINE: NEGATIVE
GLUCOSE, UA: NEGATIVE mg/dL
HGB URINE DIPSTICK: NEGATIVE
Ketones, ur: NEGATIVE mg/dL
Leukocytes, UA: NEGATIVE
NITRITE: NEGATIVE
Protein, ur: NEGATIVE mg/dL
SPECIFIC GRAVITY, URINE: 1.015 (ref 1.005–1.030)
Urobilinogen, UA: 0.2 mg/dL (ref 0.0–1.0)
pH: 6 (ref 5.0–8.0)

## 2013-08-01 LAB — COMPREHENSIVE METABOLIC PANEL
ALBUMIN: 4.7 g/dL (ref 3.5–5.2)
ALK PHOS: 84 U/L (ref 39–117)
ALT: 106 U/L — AB (ref 0–53)
AST: 48 U/L — ABNORMAL HIGH (ref 0–37)
BILIRUBIN TOTAL: 0.4 mg/dL (ref 0.3–1.2)
BUN: 18 mg/dL (ref 6–23)
CHLORIDE: 99 meq/L (ref 96–112)
CO2: 26 mEq/L (ref 19–32)
Calcium: 10.4 mg/dL (ref 8.4–10.5)
Creatinine, Ser: 0.96 mg/dL (ref 0.50–1.35)
GFR calc Af Amer: >90 mL/min (ref >90)
GFR calc non Af Amer: >90 mL/min (ref >90)
Glucose, Bld: 101 mg/dL — ABNORMAL HIGH (ref 70–99)
POTASSIUM: 4.5 meq/L (ref 3.7–5.3)
SODIUM: 140 meq/L (ref 137–147)
TOTAL PROTEIN: 8.4 g/dL — AB (ref 6.0–8.3)

## 2013-08-01 LAB — LIPASE, BLOOD: Lipase: 19 U/L (ref 11–59)

## 2013-08-01 IMAGING — CR DG ABDOMEN 2V
3 series · 3 of 3 positions shown · non-contrast
Comparison: [DATE]

CLINICAL DATA: Right lower quadrant pain

EXAM:
ABDOMEN - 2 VIEW

[w abdomen upright]
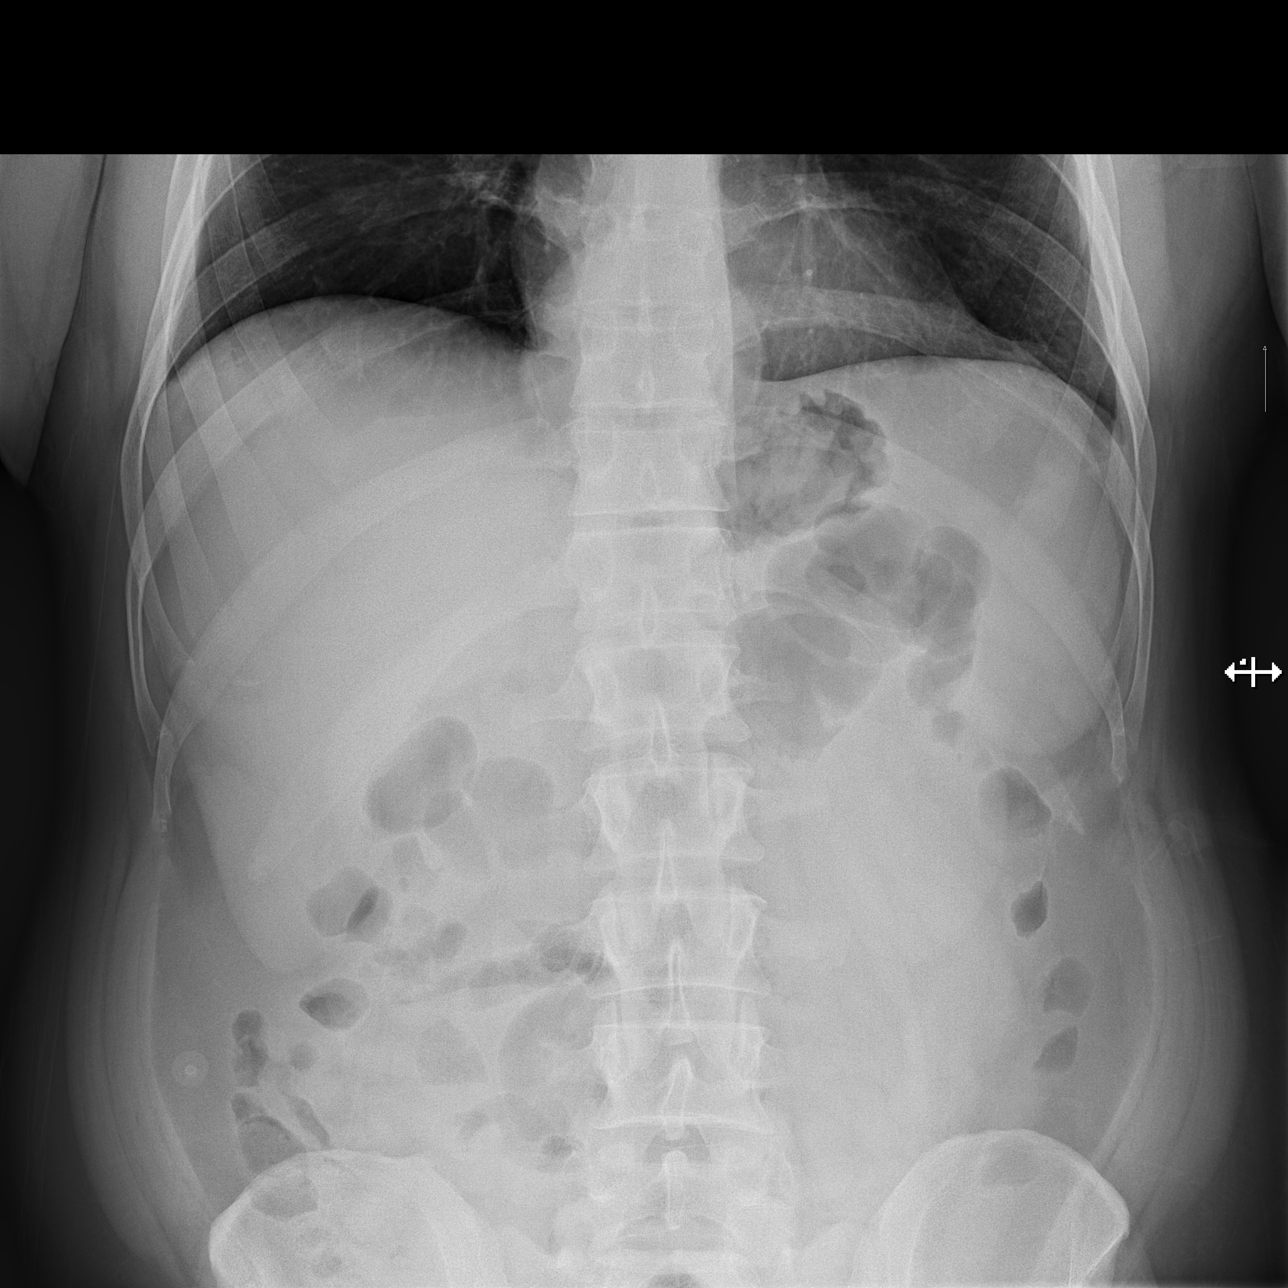

[t abdomen supine (1 of 2)]
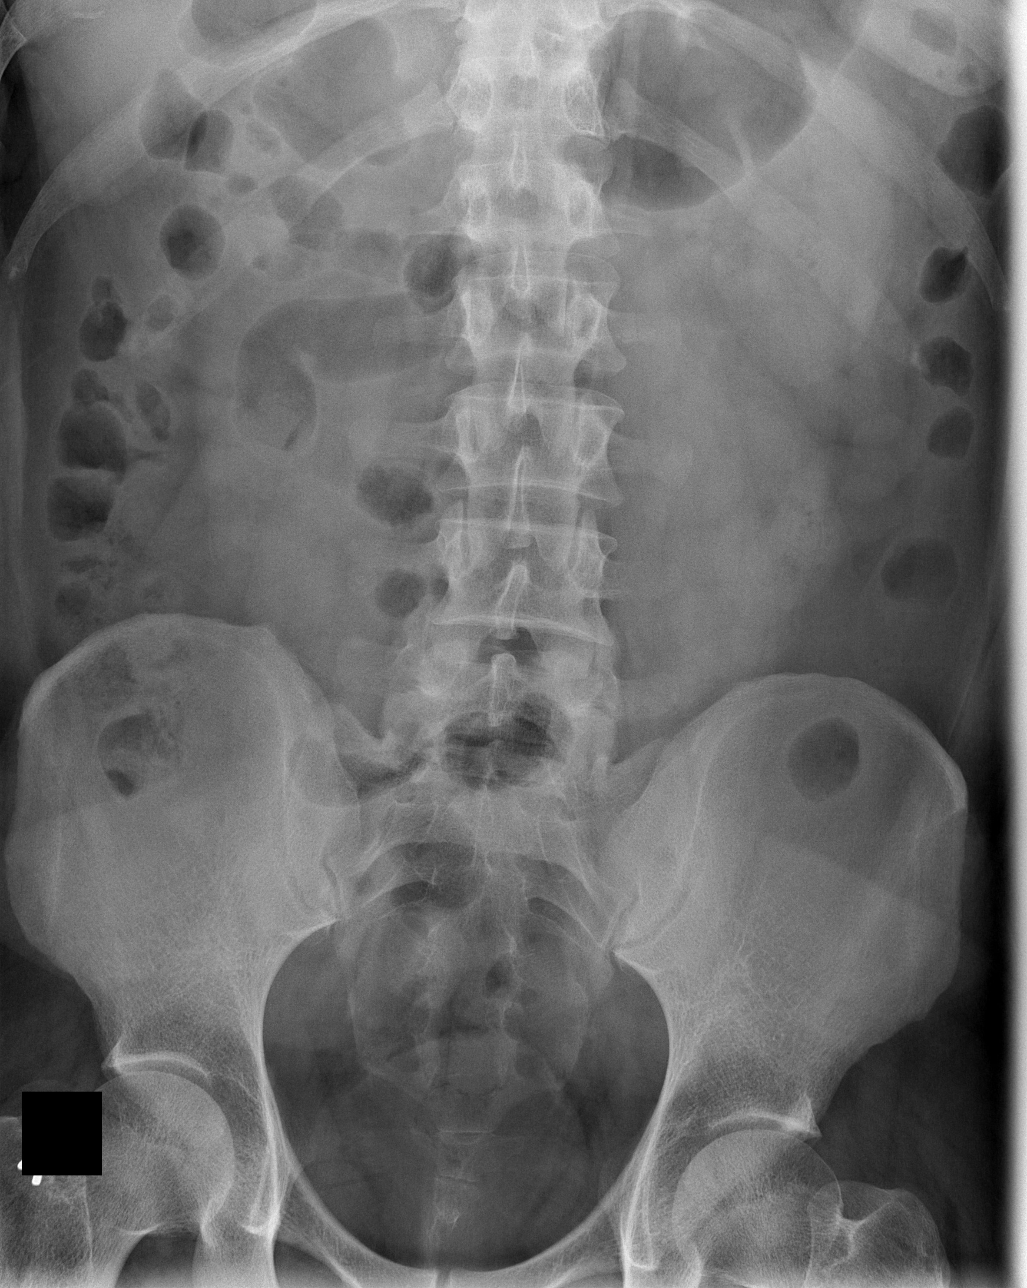

[t abdomen supine (2 of 2)]
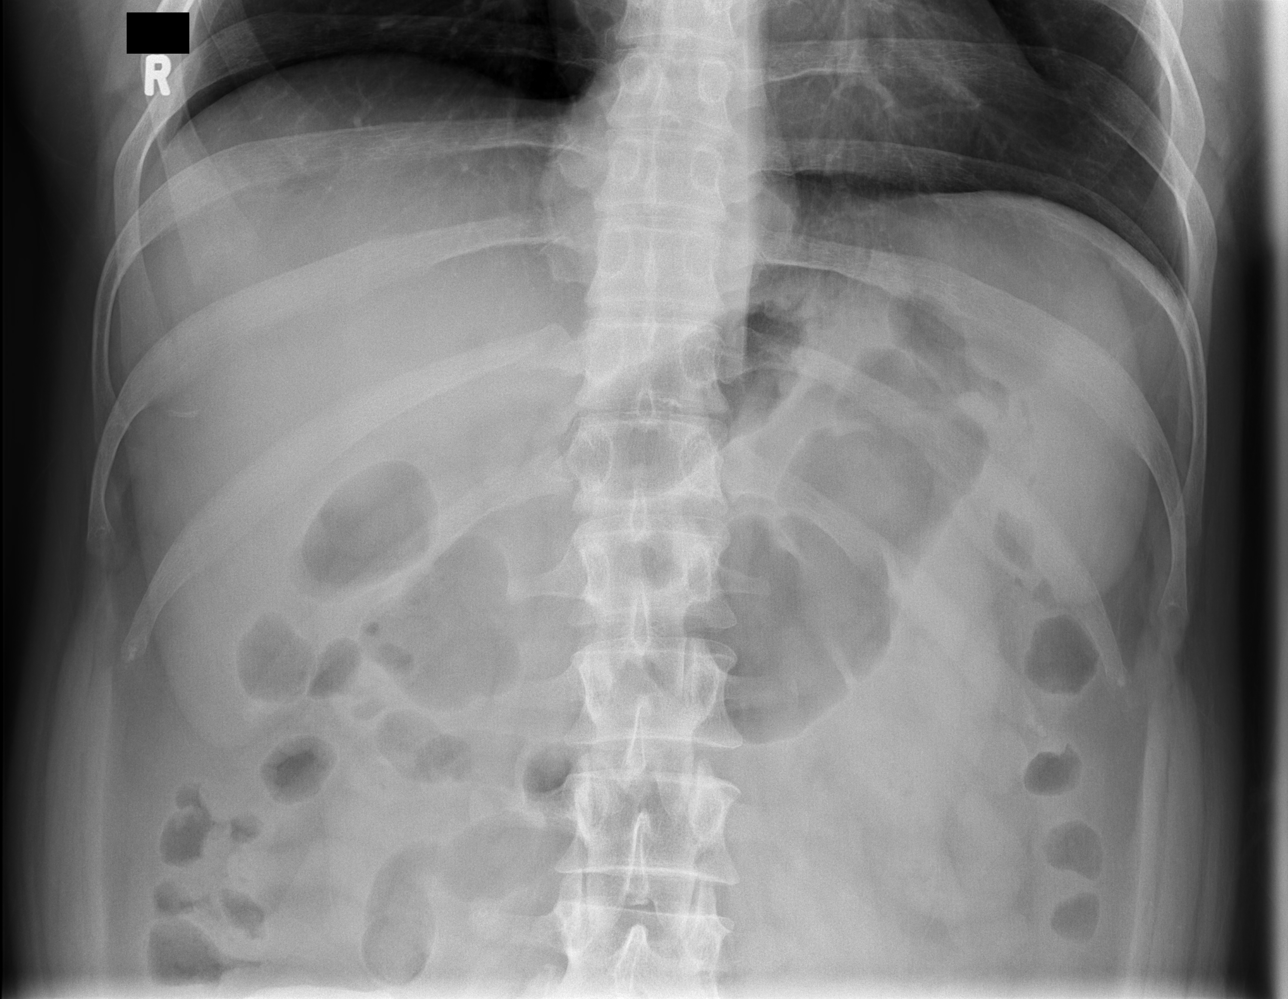

[3 of 3 positions shown; findings below may reference images not displayed]

FINDINGS: The bowel gas pattern is normal. There is no evidence of free air.
No radio-opaque calculi or other significant radiographic
abnormality is seen.
IMPRESSION: No acute abnormality noted.

## 2013-08-01 MED ORDER — LANSOPRAZOLE 30 MG PO CPDR: 30.0000 mg | DELAYED_RELEASE_CAPSULE | Freq: Every day | ORAL | Status: DC

## 2013-08-01 MED ORDER — MORPHINE SULFATE 4 MG/ML IJ SOLN
4.0000 mg | Freq: Once | INTRAMUSCULAR | Status: AC
  Administered 2013-08-01: 4 mg via INTRAVENOUS
  Filled 2013-08-01: qty 1

## 2013-08-01 MED ORDER — ONDANSETRON HCL 4 MG/2ML IJ SOLN
4.0000 mg | Freq: Once | INTRAMUSCULAR | Status: AC
  Administered 2013-08-01: 4 mg via INTRAVENOUS
  Filled 2013-08-01: qty 2

## 2013-08-01 MED ORDER — PROMETHAZINE HCL 25 MG/ML IJ SOLN
25.0000 mg | Freq: Once | INTRAMUSCULAR | Status: AC
  Administered 2013-08-01: 25 mg via INTRAVENOUS
  Filled 2013-08-01: qty 1

## 2013-08-01 MED ORDER — HYDROMORPHONE HCL PF 1 MG/ML IJ SOLN
1.0000 mg | Freq: Once | INTRAMUSCULAR | Status: AC
  Administered 2013-08-01: 1 mg via INTRAVENOUS
  Filled 2013-08-01: qty 1

## 2013-08-01 MED ORDER — SODIUM CHLORIDE 0.9 % IV BOLUS (SEPSIS)
1000.0000 mL | Freq: Once | INTRAVENOUS | Status: AC
  Administered 2013-08-01: 1000 mL via INTRAVENOUS

## 2013-08-01 MED ORDER — PROMETHAZINE HCL 25 MG PO TABS: 25.0000 mg | ORAL_TABLET | Freq: Four times a day (QID) | ORAL | Status: DC | PRN

## 2013-08-01 NOTE — Discharge Instructions (Signed)
Abdominal Pain, Adult °Many things can cause abdominal pain. Usually, abdominal pain is not caused by a disease and will improve without treatment. It can often be observed and treated at home. Your health care provider will do a physical exam and possibly order blood tests and X-rays to help determine the seriousness of your pain. However, in many cases, more time must pass before a clear cause of the pain can be found. Before that point, your health care provider may not know if you need more testing or further treatment. °HOME CARE INSTRUCTIONS  °Monitor your abdominal pain for any changes. The following actions may help to alleviate any discomfort you are experiencing: °· Only take over-the-counter or prescription medicines as directed by your health care provider. °· Do not take laxatives unless directed to do so by your health care provider. °· Try a clear liquid diet (broth, tea, or water) as directed by your health care provider. Slowly move to a bland diet as tolerated. °SEEK MEDICAL CARE IF: °· You have unexplained abdominal pain. °· You have abdominal pain associated with nausea or diarrhea. °· You have pain when you urinate or have a bowel movement. °· You experience abdominal pain that wakes you in the night. °· You have abdominal pain that is worsened or improved by eating food. °· You have abdominal pain that is worsened with eating fatty foods. °SEEK IMMEDIATE MEDICAL CARE IF:  °· Your pain does not go away within 2 hours. °· You have a fever. °· You keep throwing up (vomiting). °· Your pain is felt only in portions of the abdomen, such as the right side or the left lower portion of the abdomen. °· You pass bloody or black tarry stools. °MAKE SURE YOU: °· Understand these instructions.   °· Will watch your condition.   °· Will get help right away if you are not doing well or get worse.   °Document Released: 12/18/2004 Document Revised: 12/29/2012 Document Reviewed: 11/17/2012 °[employer]® Patient  Information ©2014 [employer]. ° °

## 2013-08-01 NOTE — ED Notes (Signed)
Patient is alert and oriented x3.  He was given DC instructions and follow up visit instructions.  Patient gave verbal understanding.  He was DC ambulatory under his own power to home.  V/S stable.  He was not showing any signs of distress on DC 

## 2013-08-01 NOTE — ED Notes (Signed)
Pt RLQ pain, n/v/d that started on Friday.  Pt states that he has had abd pain off and on for year or so and issues with acid reflux.  Pt states that he has seen blood in vomit but not sure if from irritation. And getting shob with vomiting. Pt is a smoker.

## 2013-08-01 NOTE — ED Provider Notes (Signed)
CSN: [id number]     Arrival date & time 08/01/13  1600 History   First MD Initiated Contact with Patient 08/01/13 1747     Chief Complaint  Patient presents with  . Abdominal Pain  . Diarrhea  . Emesis     (Consider location/radiation/quality/duration/timing/severity/associated sxs/prior Treatment) Patient is a 33 y.o. male presenting with abdominal pain, diarrhea, and vomiting. The history is provided by the patient.  Abdominal Pain Associated symptoms: diarrhea, nausea and vomiting   Associated symptoms: no chest pain and no shortness of breath   Diarrhea Associated symptoms: abdominal pain and vomiting   Associated symptoms: no headaches   Emesis Associated symptoms: abdominal pain and diarrhea   Associated symptoms: no headaches    patient's had right lower quadrant abdominal pain for one year, although he had CT scan 3 years ago for right lower quadrant pain. He states he has some chronic pain that he has seen gastroenterology and other doctors for. He states he found a cause. He states his been more severe over the last week. He states it is in the same spot. He states he's had nausea vomiting or diarrhea now. He states that he had read diarrhea on Saturday, but has not had any more blood since. He has had some chills. She had decreased appetite. He states he has had MRIs and colonoscopies without a cause found. No dysuria. The pain is sharp and constant.  Past Medical History  Diagnosis Date  . Nerve pain   . Crush injury lower leg     Left lower leg  . Depression   . Migraine   . Bipolar disorder   . Allergy   . Anxiety   . Gastric ulcer   . GERD (gastroesophageal reflux disease)    Past Surgical History  Procedure Laterality Date  . Mass excision Left 01/25/2013    Procedure: EXCISION MASS DORSAL ASPECT LEFT LONG FINGER DISTAL INTERPHALANGEAL JOINT;  Surgeon: Wyn Forsterobert V Sypher Jr., MD;  Location: [facility];  Service: Orthopedics;  Laterality: Left;  Left  long    Family History  Problem Relation Age of Onset  . Hyperlipidemia Father   . Hypertension Father   . Cancer Paternal Grandfather     lung, colon  . Stroke    . Heart disease    . Diabetes Paternal Grandmother   . Cancer Paternal Grandmother   . Cancer Maternal Grandmother   . Cancer Maternal Grandfather   . Depression Paternal Aunt   . Anxiety disorder Paternal Aunt   . Anxiety disorder Father   . Drug abuse Father   . Drug abuse Paternal Uncle    History  Substance Use Topics  . Smoking status: Current Every Day Smoker -- 1.00 packs/day  . Smokeless tobacco: Never Used  . Alcohol Use: Yes     Comment: social    Review of Systems  Constitutional: Negative for activity change and appetite change.  Eyes: Negative for pain.  Respiratory: Negative for chest tightness and shortness of breath.   Cardiovascular: Negative for chest pain and leg swelling.  Gastrointestinal: Positive for nausea, vomiting, abdominal pain, diarrhea and blood in stool.  Genitourinary: Negative for flank pain.  Musculoskeletal: Negative for back pain and neck stiffness.       Chronic left lower leg pain  Skin: Negative for rash.  Neurological: Negative for weakness, numbness and headaches.  Psychiatric/Behavioral: Negative for behavioral problems.      Allergies  Aspirin; Codeine; Penicillins; and Amitriptyline  Home  Medications   Prior to Admission medications   Medication Sig Start Date End Date Taking? Authorizing Provider  acetaminophen (TYLENOL) 500 MG tablet Take 500 mg by mouth every 6 (six) hours as needed for mild pain or headache.   Yes Historical Provider, MD  gabapentin (NEURONTIN) 600 MG tablet Take 600 mg by mouth 3 (three) times daily.   Yes Historical Provider, MD  ibuprofen (ADVIL,MOTRIN) 800 MG tablet Take 800 mg by mouth every 8 (eight) hours as needed for headache or moderate pain.   Yes Historical Provider, MD  Multiple Vitamin (MULTIVITAMIN WITH MINERALS) TABS  tablet Take 1 tablet by mouth daily.   Yes Historical Provider, MD  nortriptyline (PAMELOR) 25 MG capsule Take 25 mg by mouth at bedtime.   Yes Historical Provider, MD  ranitidine (ZANTAC) 150 MG tablet Take 150 mg by mouth 2 (two) times daily.   Yes Historical Provider, MD   BP 115/58  Pulse 76  Temp(Src) 98.8 F (37.1 C) (Oral)  Resp 16  Wt 230 lb (104.327 kg)  SpO2 96% Physical Exam  Nursing note and vitals reviewed. Constitutional: He is oriented to person, place, and time. He appears well-developed and well-nourished.  HENT:  Head: Normocephalic and atraumatic.  Eyes: EOM are normal. Pupils are equal, round, and reactive to light.  Neck: Normal range of motion. Neck supple.  Cardiovascular: Normal rate, regular rhythm and normal heart sounds.   No murmur heard. Pulmonary/Chest: Effort normal and breath sounds normal.  Abdominal: Soft. He exhibits no distension and no mass. There is tenderness. There is no rebound and no guarding.  Right lower quadrant tenderness without rebound or guarding.  Musculoskeletal: Normal range of motion. He exhibits no edema.  Neurological: He is alert and oriented to person, place, and time. No cranial nerve deficit.  Skin: Skin is warm and dry.  Psychiatric: He has a normal mood and affect.    ED Course  Procedures (including critical care time) Labs Review Labs Reviewed  COMPREHENSIVE METABOLIC PANEL - Abnormal; Notable for the following:    Glucose, Bld 101 (*)    Total Protein 8.4 (*)    AST 48 (*)    ALT 106 (*)    All other components within normal limits  CBC WITH DIFFERENTIAL  LIPASE, BLOOD  URINALYSIS, ROUTINE W REFLEX MICROSCOPIC    Imaging Review Dg Abd 2 Views  08/01/2013   CLINICAL DATA:  Right lower quadrant pain  EXAM: ABDOMEN - 2 VIEW  COMPARISON:  11/17/2012  FINDINGS: The bowel gas pattern is normal. There is no evidence of free air. No radio-opaque calculi or other significant radiographic abnormality is seen.   IMPRESSION: No acute abnormality noted.   Electronically Signed   By: Alcide CleverMark  Lukens M.D.   On: 08/01/2013 20:37     EKG Interpretation None      MDM   Final diagnoses:  Abdominal pain  Nausea vomiting and diarrhea    Patient with acute on chronic abdominal pain. Also nausea vomiting diarrhea. Lab work reassuring. Patient's pain is improved to his baseline. Does not feel that he needs a CT at this time. Will be discharged home to followup with gastroenterology    Juliet RudeNathan R. Rubin PayorPickering, MD 08/02/13 0020

## 2013-11-05 ENCOUNTER — Ambulatory Visit (INDEPENDENT_AMBULATORY_CARE_PROVIDER_SITE_OTHER): Payer: 59 | Admitting: Family Medicine

## 2013-11-05 ENCOUNTER — Ambulatory Visit (INDEPENDENT_AMBULATORY_CARE_PROVIDER_SITE_OTHER): Payer: 59

## 2013-11-05 VITALS — BP 128/82 | HR 105 | Temp 98.6°F | Resp 16 | Ht 69.5 in | Wt 235.0 lb

## 2013-11-05 DIAGNOSIS — S60222A Contusion of left hand, initial encounter: Secondary | ICD-10-CM

## 2013-11-05 DIAGNOSIS — M79609 Pain in unspecified limb: Secondary | ICD-10-CM

## 2013-11-05 DIAGNOSIS — M79642 Pain in left hand: Secondary | ICD-10-CM

## 2013-11-05 DIAGNOSIS — S60229A Contusion of unspecified hand, initial encounter: Secondary | ICD-10-CM

## 2013-11-05 NOTE — Progress Notes (Signed)
Patient ID: Shane Cole., male   DOB: 1980/05/21, 33 y.o.   MRN: [id number]   Subjective:  This chart was scribed for Nilda Simmer, MD by Carl Best, Medical Scribe. This patient was seen in Room 13 and the patient's care was started at 3:57 PM.   Patient ID: Shane Cole., male    DOB: [birth year], 33 y.o.   MRN: [id number]  11/05/2013  Hand Pain  HPI HPI Comments: Shane Cole. is a 33 y.o. male who presents to the [facility] complaining of constant left hand pain with associated swelling that started yesterday after he hit his hand on a strut while working on his mother's car.  He buddy taped his fourth finger, took Ibuprofen, and applied ice to his hand with no relief to his pain.  He states that applying ice to the area aggravated his pain.  Dr. Artist Pais is his PCP.  He is right hand dominant.  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  Some tingling in hand has developed. He has swelling.  No bruising to hand.    Review of Systems  Musculoskeletal: Positive for arthralgias, joint swelling and myalgias.  Neurological: Positive for numbness.    Past Medical History  Diagnosis Date  . Nerve pain   . Crush injury lower leg     Left lower leg  . Depression   . Migraine   . Bipolar disorder   . Allergy   . Anxiety   . Gastric ulcer   . GERD (gastroesophageal reflux disease)    Past Surgical History  Procedure Laterality Date  . Mass excision Left 01/25/2013    Procedure: EXCISION MASS DORSAL ASPECT LEFT LONG FINGER DISTAL INTERPHALANGEAL JOINT;  Surgeon: Wyn Forster., MD;  Location: [facility];  Service: Orthopedics;  Laterality: Left;  Left long    Allergies  Allergen Reactions  . Aspirin     Tinnitus, dizzy, short of breath  . Codeine Itching  . Penicillins Anaphylaxis  . Amitriptyline Rash   Current Outpatient Prescriptions  Medication Sig Dispense Refill  . acetaminophen (TYLENOL) 500 MG tablet Take 500 mg by mouth every 6  (six) hours as needed for mild pain or headache.      . gabapentin (NEURONTIN) 600 MG tablet Take 600 mg by mouth 3 (three) times daily.      Marland Kitchen ibuprofen (ADVIL,MOTRIN) 800 MG tablet Take 800 mg by mouth every 8 (eight) hours as needed for headache or moderate pain.      Marland Kitchen lansoprazole (PREVACID) 30 MG capsule Take 1 capsule (30 mg total) by mouth daily at 12 noon.  14 capsule  0  . Multiple Vitamin (MULTIVITAMIN WITH MINERALS) TABS tablet Take 1 tablet by mouth daily.      . nortriptyline (PAMELOR) 25 MG capsule Take 25 mg by mouth at bedtime.      . ranitidine (ZANTAC) 150 MG tablet Take 150 mg by mouth 2 (two) times daily.       No current facility-administered medications for this visit.       Objective:    BP 128/82  Pulse 105  Temp(Src) 98.6 F (37 C) (Oral)  Resp 16  Ht 5' 9.5" (1.765 m)  Wt 235 lb (106.595 kg)  BMI 34.22 kg/m2  SpO2 99% Physical Exam  Nursing note and vitals reviewed. Constitutional: He is oriented to person, place, and time. He appears well-developed and well-nourished.  HENT:  Head: Normocephalic and  atraumatic.  Eyes: EOM are normal.  Neck: Normal range of motion.  Cardiovascular: Normal rate and intact distal pulses.   Capillary refill < 3 seconds of 4th and 5th digits L hand.  Pulmonary/Chest: Effort normal.  Musculoskeletal: Normal range of motion.  L HAND:  +Tender to palpation along fifth metacarpal and fourth metacarpal with diffuse swelling and tenderness.  +Tender to palpation proximal fifth digit and fifth PIP.  Non tender at fifth DIP.  First through fourth digits non-tender with normal ROM.    Neurological: He is alert and oriented to person, place, and time. No sensory deficit.  Skin: Skin is warm and dry.  Psychiatric: He has a normal mood and affect. His behavior is normal.    [facility] preliminary x-ray report read by Dr. Katrinka Blazing: L HAND: NAD  Assessment & Plan:   1. Pain of left hand   2. Contusion of left hand, initial encounter     1. Pain of L hand: New.  Recommend Tylenol or Motrin for pain. 2.  Contusion of L hand:  New.  With moderate swelling; ulnar gutter splint placed for comfort. RTC one week for reevaluation and repeat imaging due to amount of swelling. 3.  Swelling L hand:  New. Secondary to contusion.  Recommend rest, elevation, icing, Ibuprofen. Ulnar gutter splint.  RTC one week for repeat imaging and reevaluation.  No orders of the defined types were placed in this encounter.    Return in about 1 week (around 11/12/2013) for recheck.   I personally performed the services described in this documentation, which was scribed in my presence.  The recorded information has been reviewed and is accurate.  Nilda Simmer, M.D.  [facility] [address] [place], [place]  [address] [phone] phone [phone] fax

## 2013-11-06 ENCOUNTER — Telehealth: Payer: Self-pay

## 2013-11-06 NOTE — Telephone Encounter (Signed)
Patient called back checking the status of his previous message for pain medication. I informed patient that Dr. Katrinka BlazingSmith was here but has been seeing patients and has not had time to respond. Patient requesting if possible if he can get something today, in a lot of pain and discomfort. Patients call back number is [phone]

## 2013-11-06 NOTE — Telephone Encounter (Signed)
Patient was seen yesterday for his left hand. Per patient he is having a lot of pain. He has been taking 800mg  Ibuprofen and it has helped some. Patient states he has more pain at night and has a difficult time sleeping. Please call medication in to [facility] [place]. If unable to call in please call patient at [phone] to pick it up

## 2013-11-07 NOTE — Telephone Encounter (Signed)
Please advise 

## 2013-11-07 NOTE — Telephone Encounter (Signed)
Patient calling back in regards to getting a pain medication. I informed patient that a nurse did acknowledge his previous message but we are still waiting on Dr. Katrinka BlazingSmith to ok it. Patient requesting to get it today if possible still having pain and unable to sleep at night. Patients call back number is [phone]

## 2013-11-08 MED ORDER — TRAMADOL HCL 50 MG PO TABS: 50.0000 mg | ORAL_TABLET | Freq: Four times a day (QID) | ORAL | Status: DC | PRN

## 2013-11-08 NOTE — Telephone Encounter (Signed)
Called in and notified pt

## 2013-11-08 NOTE — Telephone Encounter (Signed)
Please call in rx for Tramadol as approved. I will not be in the office today to sign rx.  It will need to be called in.

## 2013-11-18 ENCOUNTER — Other Ambulatory Visit: Payer: Self-pay | Admitting: Family Medicine

## 2013-12-13 ENCOUNTER — Encounter: Payer: Self-pay | Admitting: Gastroenterology

## 2013-12-20 ENCOUNTER — Ambulatory Visit (INDEPENDENT_AMBULATORY_CARE_PROVIDER_SITE_OTHER): Payer: 59 | Admitting: Gastroenterology

## 2013-12-20 ENCOUNTER — Other Ambulatory Visit (INDEPENDENT_AMBULATORY_CARE_PROVIDER_SITE_OTHER): Payer: 59

## 2013-12-20 ENCOUNTER — Encounter: Payer: Self-pay | Admitting: Gastroenterology

## 2013-12-20 VITALS — BP 130/78 | HR 72 | Ht 69.5 in | Wt 240.8 lb

## 2013-12-20 DIAGNOSIS — R197 Diarrhea, unspecified: Secondary | ICD-10-CM

## 2013-12-20 DIAGNOSIS — K219 Gastro-esophageal reflux disease without esophagitis: Secondary | ICD-10-CM

## 2013-12-20 LAB — CBC WITH DIFFERENTIAL/PLATELET
BASOS ABS: 0 10*3/uL (ref 0.0–0.1)
Basophils Relative: 0.4 % (ref 0.0–3.0)
Eosinophils Absolute: 0.6 10*3/uL (ref 0.0–0.7)
Eosinophils Relative: 4.8 % (ref 0.0–5.0)
HEMATOCRIT: 46.8 % (ref 39.0–52.0)
Hemoglobin: 15.8 g/dL (ref 13.0–17.0)
LYMPHS ABS: 2.2 10*3/uL (ref 0.7–4.0)
Lymphocytes Relative: 19.7 % (ref 12.0–46.0)
MCHC: 33.7 g/dL (ref 30.0–36.0)
MCV: 90.7 fl (ref 78.0–100.0)
Monocytes Absolute: 0.6 10*3/uL (ref 0.1–1.0)
Monocytes Relative: 5.7 % (ref 3.0–12.0)
Neutro Abs: 7.9 10*3/uL — ABNORMAL HIGH (ref 1.4–7.7)
Neutrophils Relative %: 69.4 % (ref 43.0–77.0)
Platelets: 261 10*3/uL (ref 150.0–400.0)
RBC: 5.17 Mil/uL (ref 4.22–5.81)
RDW: 12.9 % (ref 11.5–15.5)
WBC: 11.4 10*3/uL — ABNORMAL HIGH (ref 4.0–10.5)

## 2013-12-20 LAB — COMPREHENSIVE METABOLIC PANEL
ALK PHOS: 78 U/L (ref 39–117)
ALT: 85 U/L — AB (ref 0–53)
AST: 32 U/L (ref 0–37)
Albumin: 4.7 g/dL (ref 3.5–5.2)
BUN: 16 mg/dL (ref 6–23)
CO2: 30 mEq/L (ref 19–32)
Calcium: 9.9 mg/dL (ref 8.4–10.5)
Chloride: 102 mEq/L (ref 96–112)
Creatinine, Ser: 1.3 mg/dL (ref 0.4–1.5)
GFR: 70.14 mL/min (ref >60.00)
Glucose, Bld: 94 mg/dL (ref 70–99)
Potassium: 4.5 mEq/L (ref 3.5–5.1)
SODIUM: 138 meq/L (ref 135–145)
Total Bilirubin: 0.3 mg/dL (ref 0.2–1.2)
Total Protein: 7.9 g/dL (ref 6.0–8.3)

## 2013-12-20 MED ORDER — MOVIPREP 100 G PO SOLR: 1.0000 | Freq: Once | ORAL | Status: DC

## 2013-12-20 NOTE — Patient Instructions (Addendum)
One of your biggest health concerns is your smoking.  This increases your risk for most cancers and serious cardiovascular diseases such as strokes, heart attacks.  You should try your best to stop.  If you need assistance, please contact your PCP or Smoking Cessation Class at [facility] [phone]) or [facility] (1-800-QUIT-NOW). You have been scheduled for an endoscopy and colonoscopy. Please follow the written instructions given to you at your visit today. Please pick up your prep at the pharmacy within the next 1-3 days. If you use inhalers (even only as needed), please bring them with you on the day of your procedure. Your physician has requested that you go to the basement for  lab work before leaving today.

## 2013-12-20 NOTE — Progress Notes (Signed)
HPI: This is a   very pleasant 33 year old man with a multitude of GI symptoms. He has constant globus, he has intermittent dysphasia. He has been gaining weight, probably 60 pounds in past year. He has intermittent right upper quadrant discomfort. He has been having trouble with diarrhea for at least 2 years. He explains 3-4 loose watery nonbloody stools daily. He has chronic GERD for which she takes Prilosec and ranitidine. He tells me that vomiting for the past 2 weeks as well. He does not take any NSAIDs. He does take Tylenol 4-6 times per day.  He has never had workup for these.    Review of systems: Pertinent positive and negative review of systems were noted in the above HPI section. Complete review of systems was performed and was otherwise normal.    Past Medical History  Diagnosis Date  . Nerve pain   . Crush injury lower leg     Left lower leg  . Depression   . Migraine   . Bipolar disorder   . Allergy   . Anxiety   . Gastric ulcer   . GERD (gastroesophageal reflux disease)     Past Surgical History  Procedure Laterality Date  . Mass excision Left 01/25/2013    Procedure: EXCISION MASS DORSAL ASPECT LEFT LONG FINGER DISTAL INTERPHALANGEAL JOINT;  Surgeon: Wyn Forster., MD;  Location: [facility];  Service: Orthopedics;  Laterality: Left;  Left long   . Mouth surgery      Current Outpatient Prescriptions  Medication Sig Dispense Refill  . acetaminophen (TYLENOL) 500 MG tablet Take 500 mg by mouth every 6 (six) hours as needed for mild pain or headache.      . gabapentin (NEURONTIN) 600 MG tablet Take 600 mg by mouth 3 (three) times daily.      Marland Kitchen ibuprofen (ADVIL,MOTRIN) 800 MG tablet Take 800 mg by mouth every 8 (eight) hours as needed for headache or moderate pain.      Marland Kitchen lansoprazole (PREVACID) 30 MG capsule Take 1 capsule (30 mg total) by mouth daily at 12 noon.  14 capsule  0  . Multiple Vitamin (MULTIVITAMIN WITH MINERALS) TABS tablet Take 1  tablet by mouth daily.      . nortriptyline (PAMELOR) 25 MG capsule Take 25 mg by mouth at bedtime.      . pantoprazole (PROTONIX) 20 MG tablet Take 20 mg by mouth.      . ranitidine (ZANTAC) 150 MG tablet Take 150 mg by mouth 2 (two) times daily.       No current facility-administered medications for this visit.    Allergies as of 12/20/2013 - Review Complete 11/05/2013  Allergen Reaction Noted  . Aspirin  06/05/2011  . Codeine Itching 06/05/2011  . Penicillins Anaphylaxis 06/05/2011  . Amitriptyline Rash 01/25/2013    Family History  Problem Relation Age of Onset  . Hyperlipidemia Father   . Hypertension Father   . Cancer Paternal Grandfather     lung, colon  . Stroke    . Heart disease    . Diabetes Paternal Grandmother   . Cancer Paternal Grandmother   . Cancer Maternal Grandmother   . Cancer Maternal Grandfather   . Depression Paternal Aunt   . Anxiety disorder Paternal Aunt   . Anxiety disorder Father   . Drug abuse Father   . Drug abuse Paternal Uncle     History   Social History  . Marital Status: Married    Spouse Name:  N/A    Number of Children: 1  . Years of Education: 10th grade   Occupational History  . Disabled     Previously worked Web designerinstalling granite counter tops   Social History Main Topics  . Smoking status: Current Every Day Smoker -- 1.00 packs/day  . Smokeless tobacco: Never Used  . Alcohol Use: Yes     Comment: social  . Drug Use: No  . Sexual Activity: Yes    Partners: Female   Other Topics Concern  . Not on file   Social History Narrative   05/24/12  [name] was born [place] He has 2 sisters. He reports that his childhood was "lousy." He completed the 10th grade. He has been married for 5 years, and is currently separated for 3 weeks. He has a daughter who is 803-1/34 years old. He has been unemployed for 2 years, and is disabled due to an on-the-job work accident. He is currently living with his aunt and cousin.  He reports that his hobbies are sports and dogs. He reports that he is spiritual, but not religious. He states that his aunt and his father are his social support system. He denies any current legal problems, but got a DUI in 2007. 05/24/12 [facility]             Physical Exam: BP 130/78  Pulse 72  Ht 5' 9.5" (1.765 m)  Wt 240 lb 12.8 oz (109.226 kg)  BMI 35.06 kg/m2 Constitutional: generally well-appearing Psychiatric: alert and oriented x3 Eyes: extraocular movements intact Mouth: oral pharynx moist, no lesions Neck: supple no lymphadenopathy Cardiovascular: heart regular rate and rhythm Lungs: clear to auscultation bilaterally Abdomen: soft, nontender, nondistended, no obvious ascites, no peritoneal signs, normal bowel sounds Extremities: no lower extremity edema bilaterally Skin: no lesions on visible extremities    Assessment and plan: 33 y.o. male with  multitude of upper and lower GI symptoms  I will like to start with lab testing including CBC, complete metabolic profile, Clostridium difficile by PCR, routine culture, ova parasites. He has chronic diarrhea and for that he will need colonoscopy. He has chronic GERD, intermittent dysphasia and more recent vomiting and for that I would like to evaluate with an upper endoscopy.

## 2013-12-21 ENCOUNTER — Other Ambulatory Visit: Payer: Self-pay

## 2013-12-21 DIAGNOSIS — K209 Esophagitis, unspecified without bleeding: Secondary | ICD-10-CM

## 2013-12-21 DIAGNOSIS — K219 Gastro-esophageal reflux disease without esophagitis: Secondary | ICD-10-CM

## 2013-12-28 ENCOUNTER — Telehealth: Payer: Self-pay | Admitting: Gastroenterology

## 2013-12-29 ENCOUNTER — Other Ambulatory Visit: Payer: Self-pay

## 2013-12-29 DIAGNOSIS — R1011 Right upper quadrant pain: Secondary | ICD-10-CM

## 2013-12-29 NOTE — Telephone Encounter (Signed)
Attempted to call pt but receive message stating "the mailbox if full please call back and try later." Unable to leave message.  Pt states that yesterday he started having abdominal pain and it radiated around to his back on the right side. Pt states it bothered him yesterday and through the night. States it is still bothersome but does not hurt as much. He does report he was more active yesterday and put together a trampoline. Pt wants to know what he can do regarding this pain, states it was intense yesterday for about 2 hours. Please advise.

## 2013-12-29 NOTE — Telephone Encounter (Signed)
Pt aware and orders in epic for labs. Pt to have US of abd at [facility] 12/30/13@8am . Pt to arrive at 7:45am and be NPO after midnight.

## 2013-12-29 NOTE — Telephone Encounter (Signed)
He had such a multitude of symptoms (upper and lower) at office visit last week that it hard to say what is causing his pains now.  Could be related to trampoline.    He had CT scan 2 years ago for right sided abd pain, but this does not usually give [facility].  He needs RUQ ulrasound, check for gallstones.  Also labs (cmet, amylase, lipase).  Thanks

## 2013-12-30 ENCOUNTER — Ambulatory Visit (INDEPENDENT_AMBULATORY_CARE_PROVIDER_SITE_OTHER): Payer: 59 | Admitting: Family Medicine

## 2013-12-30 ENCOUNTER — Telehealth: Payer: Self-pay | Admitting: Gastroenterology

## 2013-12-30 ENCOUNTER — Ambulatory Visit (INDEPENDENT_AMBULATORY_CARE_PROVIDER_SITE_OTHER): Payer: 59

## 2013-12-30 ENCOUNTER — Ambulatory Visit (HOSPITAL_COMMUNITY)
Admission: RE | Admit: 2013-12-30 | Discharge: 2013-12-30 | Disposition: A | Discharge status: Home / Self Care | Payer: 59 | Source: Ambulatory Visit | Attending: Gastroenterology | Admitting: Gastroenterology

## 2013-12-30 ENCOUNTER — Other Ambulatory Visit (INDEPENDENT_AMBULATORY_CARE_PROVIDER_SITE_OTHER): Payer: 59

## 2013-12-30 ENCOUNTER — Other Ambulatory Visit: Payer: Self-pay

## 2013-12-30 VITALS — BP 132/82 | HR 105 | Temp 97.9°F | Resp 18 | Ht 70.0 in | Wt 241.0 lb

## 2013-12-30 DIAGNOSIS — R112 Nausea with vomiting, unspecified: Secondary | ICD-10-CM

## 2013-12-30 DIAGNOSIS — R1011 Right upper quadrant pain: Secondary | ICD-10-CM

## 2013-12-30 DIAGNOSIS — K8020 Calculus of gallbladder without cholecystitis without obstruction: Secondary | ICD-10-CM

## 2013-12-30 DIAGNOSIS — K209 Esophagitis, unspecified without bleeding: Secondary | ICD-10-CM

## 2013-12-30 DIAGNOSIS — K5909 Other constipation: Secondary | ICD-10-CM

## 2013-12-30 DIAGNOSIS — K8080 Other cholelithiasis without obstruction: Secondary | ICD-10-CM | POA: Diagnosis not present

## 2013-12-30 DIAGNOSIS — K219 Gastro-esophageal reflux disease without esophagitis: Secondary | ICD-10-CM

## 2013-12-30 LAB — POCT UA - MICROSCOPIC ONLY
Bacteria, U Microscopic: NEGATIVE
Casts, Ur, LPF, POC: NEGATIVE
Crystals, Ur, HPF, POC: NEGATIVE
Epithelial cells, urine per micros: NEGATIVE
Mucus, UA: NEGATIVE
RBC, urine, microscopic: NEGATIVE
WBC, Ur, HPF, POC: NEGATIVE
Yeast, UA: NEGATIVE

## 2013-12-30 LAB — COMPREHENSIVE METABOLIC PANEL
ALK PHOS: 68 U/L (ref 39–117)
ALT: 82 U/L — AB (ref 0–53)
AST: 35 U/L (ref 0–37)
Albumin: 4.1 g/dL (ref 3.5–5.2)
BILIRUBIN TOTAL: 0.7 mg/dL (ref 0.2–1.2)
BUN: 15 mg/dL (ref 6–23)
CO2: 26 mEq/L (ref 19–32)
Calcium: 9.5 mg/dL (ref 8.4–10.5)
Chloride: 105 mEq/L (ref 96–112)
Creatinine, Ser: 1 mg/dL (ref 0.4–1.5)
GFR: 93.97 mL/min (ref >60.00)
GLUCOSE: 101 mg/dL — AB (ref 70–99)
Potassium: 4.2 mEq/L (ref 3.5–5.1)
SODIUM: 138 meq/L (ref 135–145)
Total Protein: 8 g/dL (ref 6.0–8.3)

## 2013-12-30 LAB — POCT CBC
Granulocyte percent: 68.9 %G (ref 37–80)
HCT, POC: 50.2 % (ref 43.5–53.7)
Hemoglobin: 16.4 g/dL (ref 14.1–18.1)
Lymph, poc: 2.7 (ref 0.6–3.4)
MCH, POC: 30 pg (ref 27–31.2)
MCHC: 32.6 g/dL (ref 31.8–35.4)
MCV: 92 fL (ref 80–97)
MID (cbc): 0.7 (ref 0–0.9)
MPV: 7.3 fL (ref 0–99.8)
POC Granulocyte: 5.46 (ref 2–6.9)
POC LYMPH PERCENT: 25 % (ref 10–50)
POC MID %: 6.1 %M (ref 0–12)
Platelet Count, POC: 298 10*3/uL (ref 142–424)
RBC: 5.46 M/uL (ref 4.69–6.13)
RDW, POC: 13.3 %
WBC: 10.7 10*3/uL — AB (ref 4.6–10.2)

## 2013-12-30 LAB — LIPASE: Lipase: 34 U/L (ref 11.0–59.0)

## 2013-12-30 LAB — AMYLASE: AMYLASE: 57 U/L (ref 27–131)

## 2013-12-30 LAB — POCT URINALYSIS DIPSTICK
Bilirubin, UA: NEGATIVE
Blood, UA: NEGATIVE
Glucose, UA: NEGATIVE
Ketones, UA: NEGATIVE
Leukocytes, UA: NEGATIVE
Nitrite, UA: NEGATIVE
Protein, UA: NEGATIVE
Spec Grav, UA: 1.01
Urobilinogen, UA: 0.2
pH, UA: 6

## 2013-12-30 LAB — HEPATIC FUNCTION PANEL
ALBUMIN: 4.1 g/dL (ref 3.5–5.2)
ALT: 82 U/L — AB (ref 0–53)
AST: 35 U/L (ref 0–37)
Alkaline Phosphatase: 68 U/L (ref 39–117)
Bilirubin, Direct: 0.1 mg/dL (ref 0.0–0.3)
Total Bilirubin: 0.7 mg/dL (ref 0.2–1.2)
Total Protein: 8 g/dL (ref 6.0–8.3)

## 2013-12-30 IMAGING — CR DG ABDOMEN ACUTE W/ 1V CHEST
4 series · 4 of 4 positions shown · non-contrast
Comparison: Abdominal ultrasound earlier same date.

CLINICAL DATA: Right upper quadrant abdominal pain. Nausea and
vomiting. Current history of cholelithiasis.

EXAM:
ACUTE ABDOMEN SERIES (ABDOMEN 2 VIEW & CHEST 1 VIEW)

[PA]
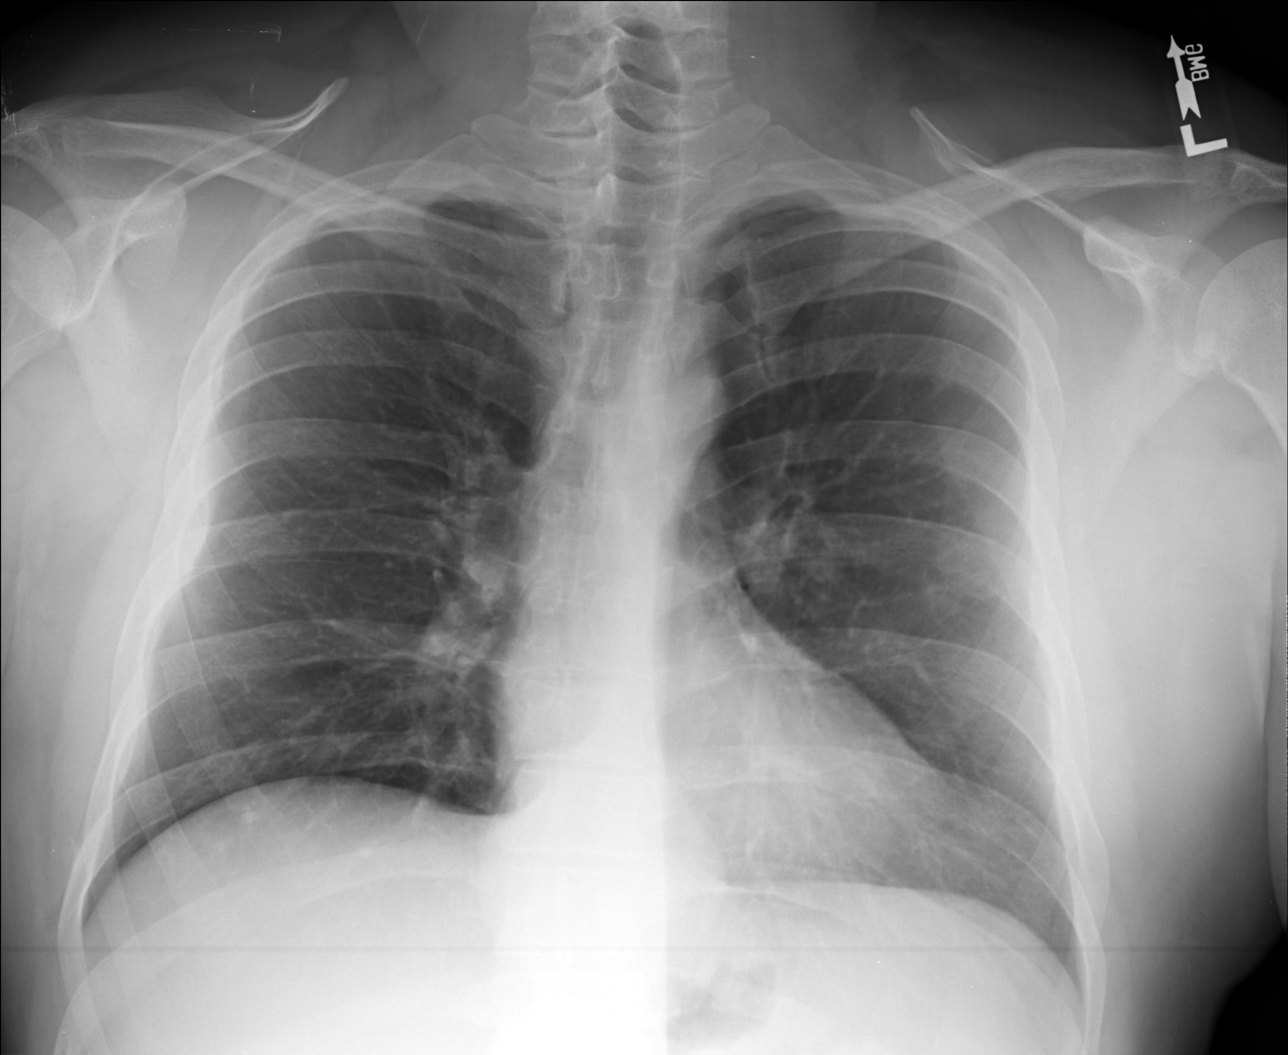

[AP (1 of 3)]
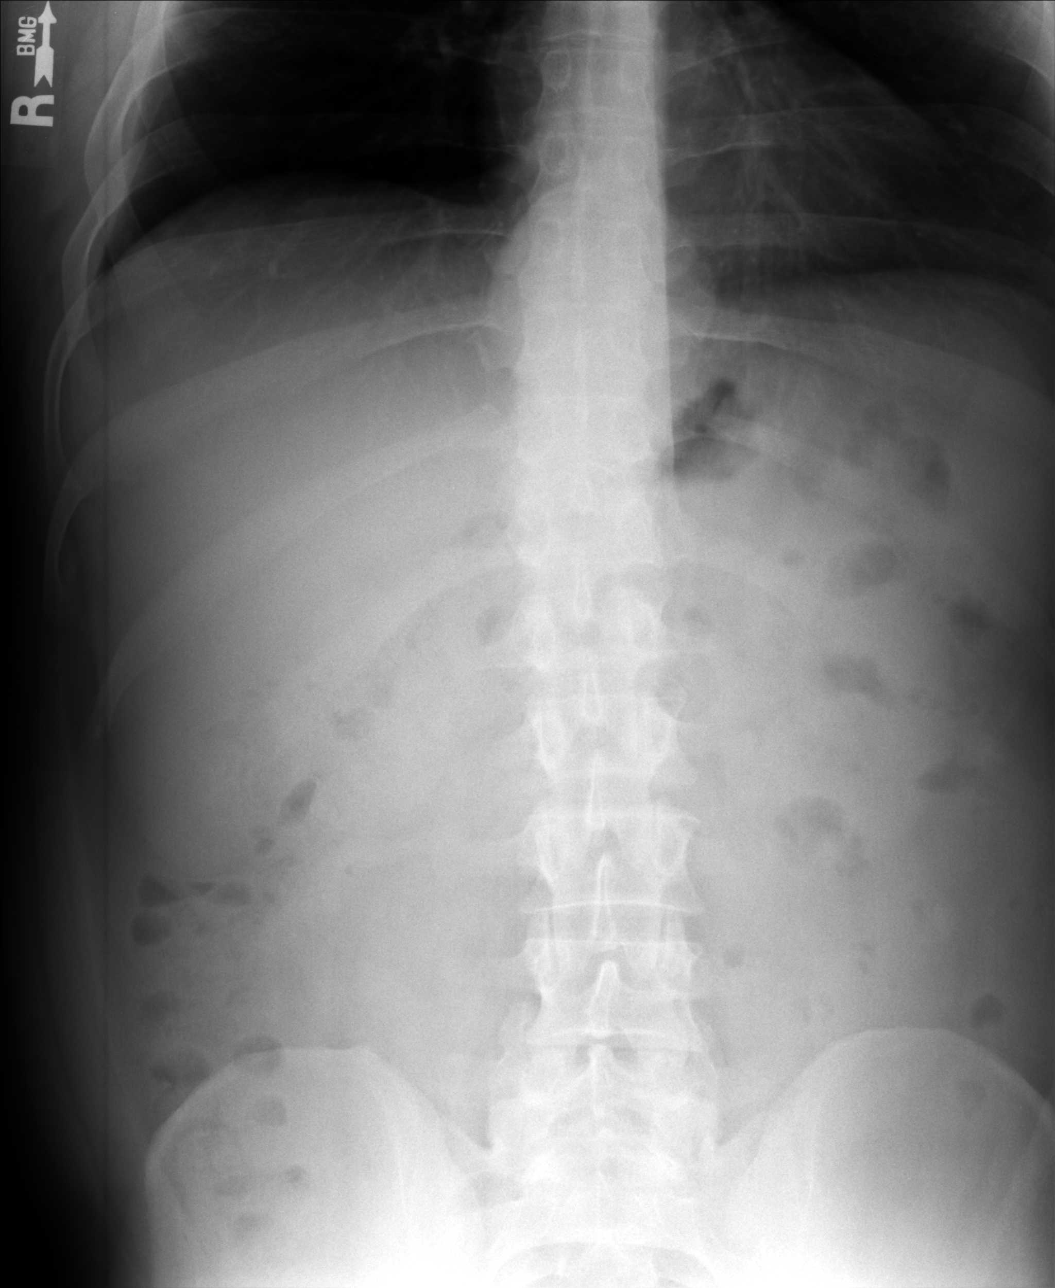

[AP (2 of 3)]
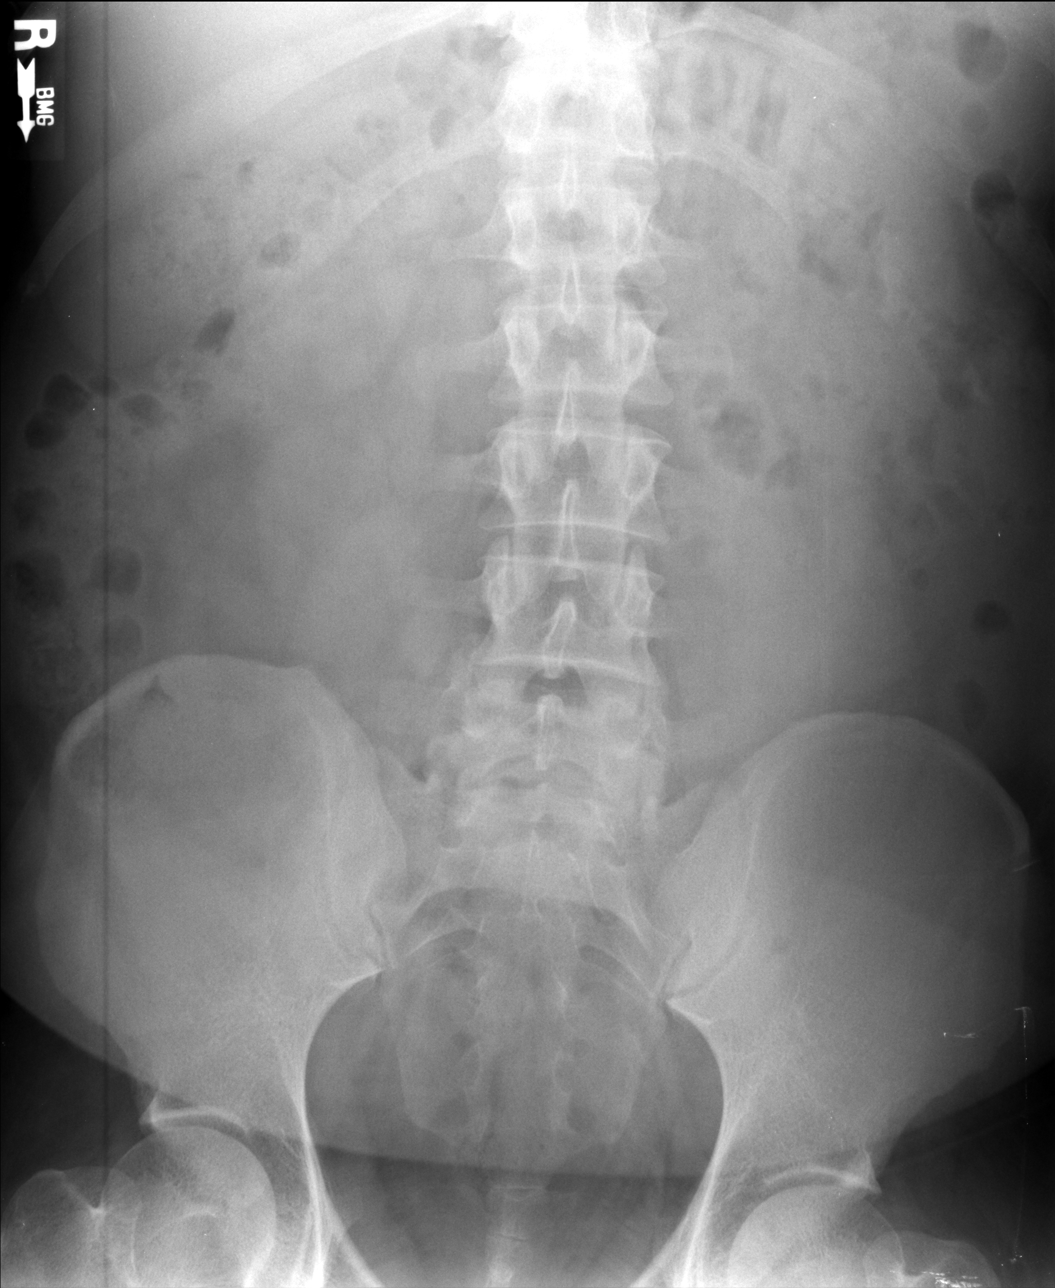

[AP (3 of 3)]
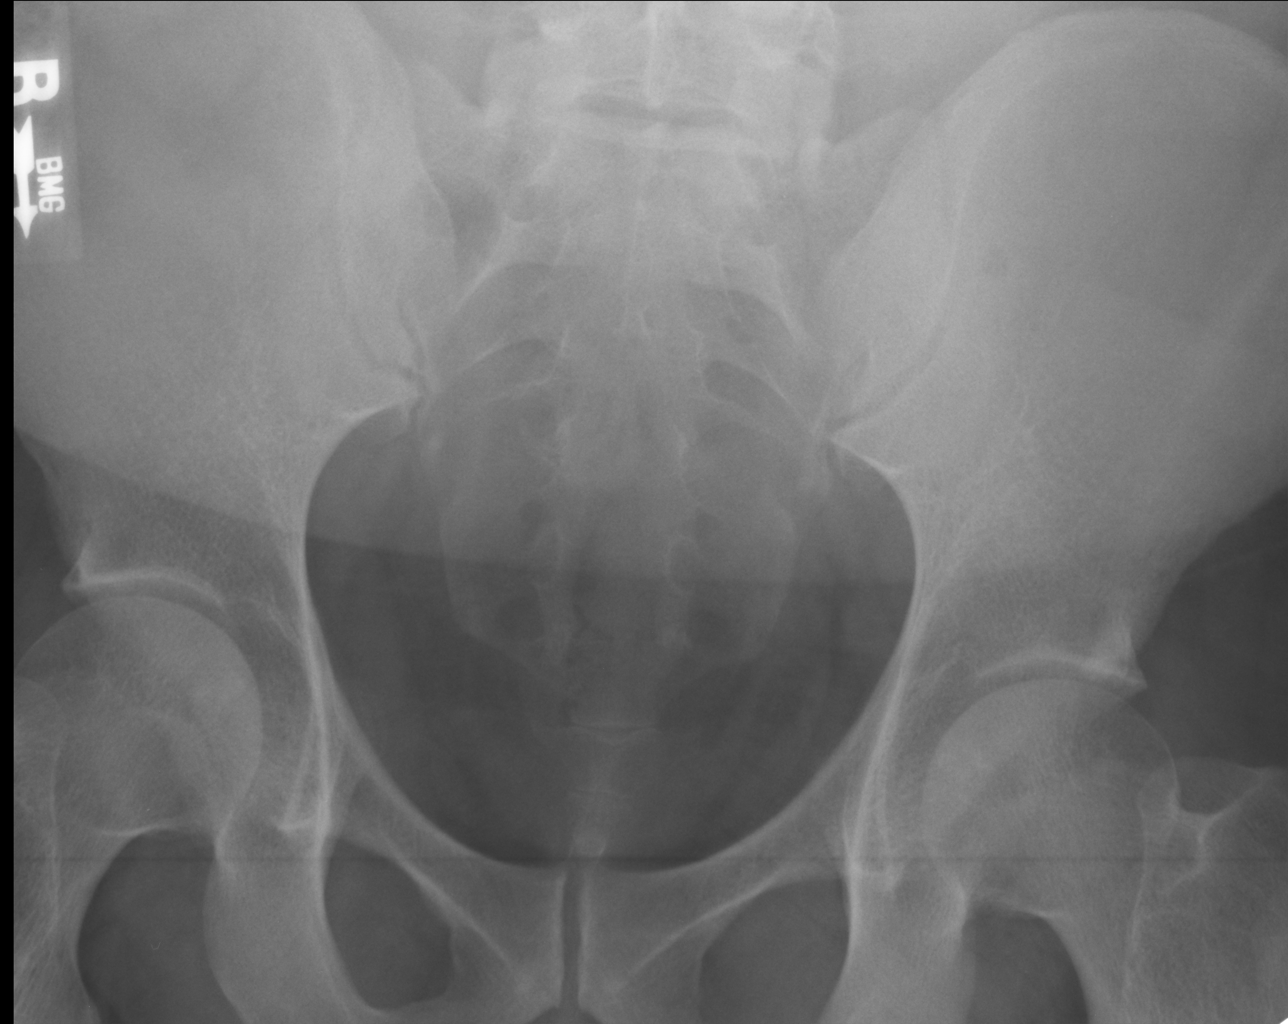

[4 of 4 positions shown; findings below may reference images not displayed]

Two-view
abdomen x-ray [DATE]. Two-view chest x-ray [DATE],
[DATE]. CT abdomen and pelvis [DATE]. Prior [HOSPITAL] [HOSPITAL], [REDACTED], [REDACTED].
FINDINGS: Bowel gas pattern unremarkable without evidence of obstruction or
significant ileus. No evidence of free air or significant air-fluid
levels on the erect image. Expected stool burden in the colon. No
abnormal calcifications. Regional skeleton intact.

Cardiac silhouette normal in size, unchanged. Small hiatal hernia,
unchanged. Hilar and mediastinal contours otherwise unremarkable.
Lungs clear. Bronchovascular markings normal. Pulmonary vascularity
normal. No visible pleural effusions. No pneumothorax. Stable focal
pleural thickening laterally in the right mid chest.
IMPRESSION: 1. No acute abdominal abnormality.
2.  No acute cardiopulmonary disease.  Stable small hiatal hernia.

No significant discrepancy with the original interpretation by Dr.
PLANT.

## 2013-12-30 IMAGING — US US ABDOMEN COMPLETE
1 series · 14 of 25 positions shown · non-contrast
Comparison: None.

CLINICAL DATA: Right upper quadrant pain

EXAM:
ULTRASOUND ABDOMEN COMPLETE

[Series 1: us abdomen complete · 0.29mm/px · 14 of 77 slices shown]
[im 1/77]
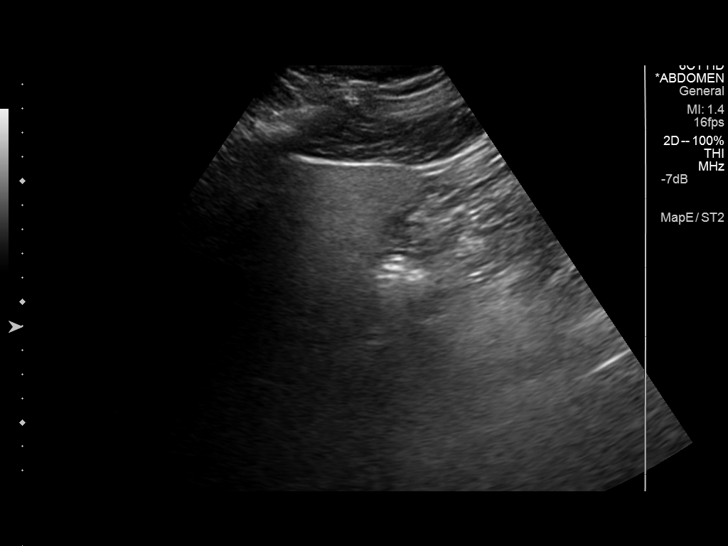
[im 7/77]
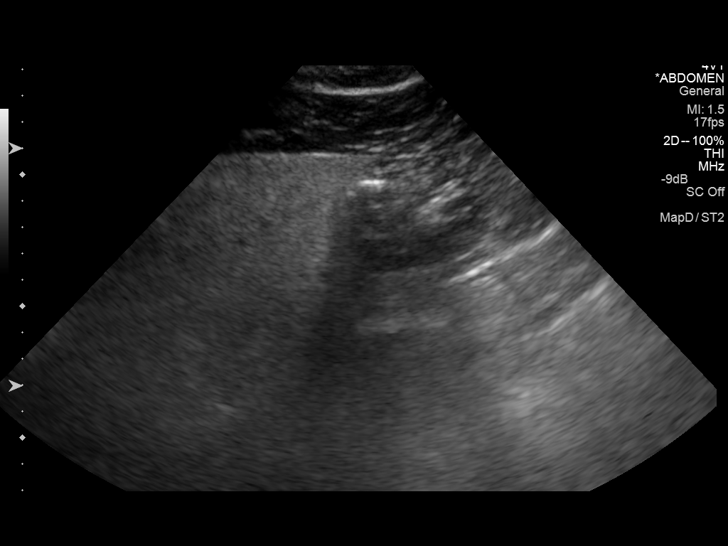
[im 13/77]
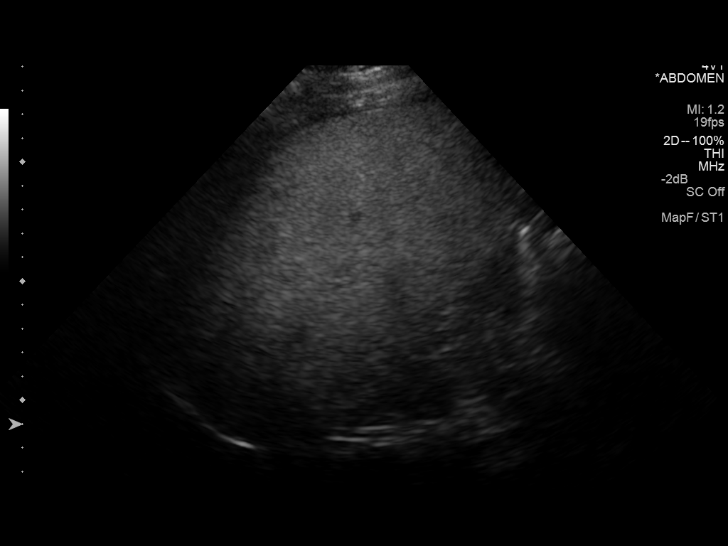
[im 20/77]
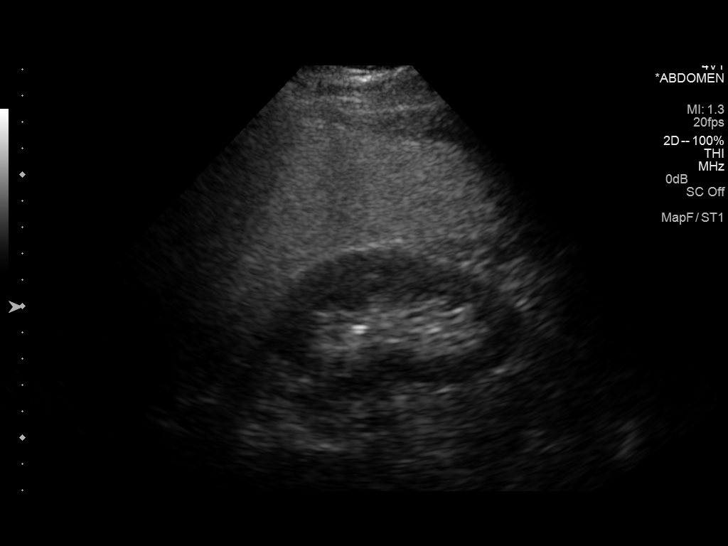
[im 26/77]
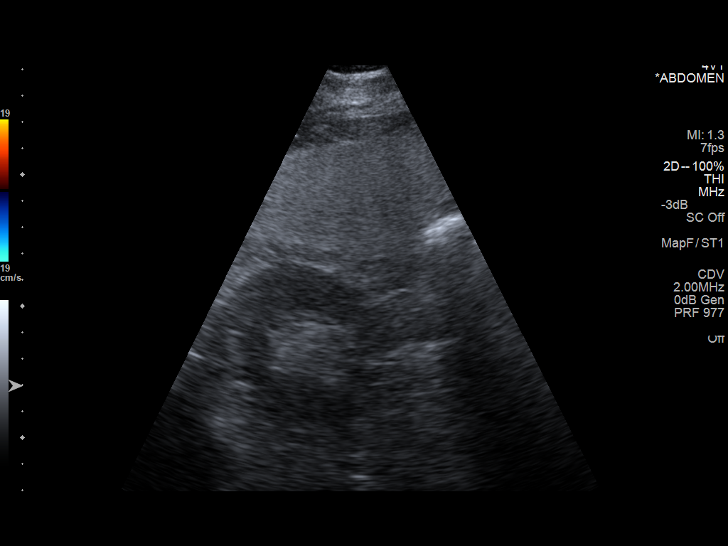
[im 29/77]
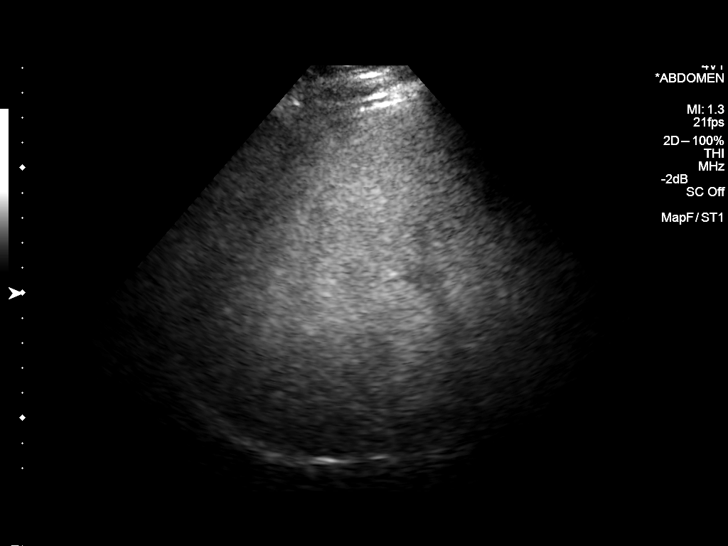
[im 35/77]
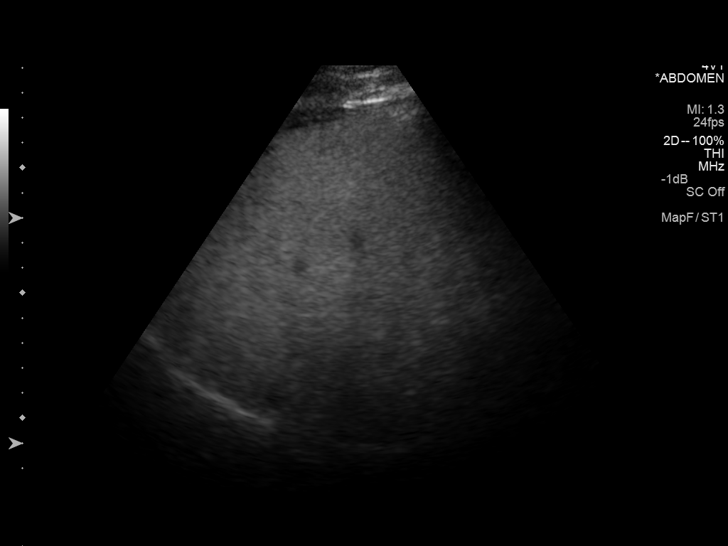
[im 42/77]
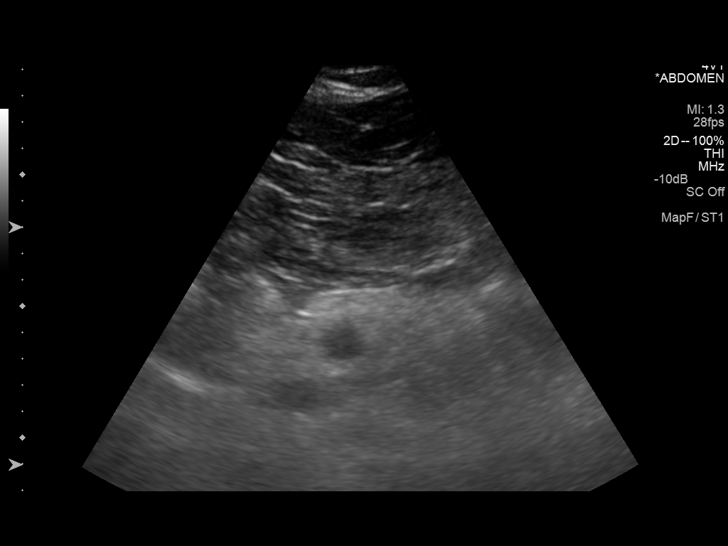
[im 48/77]
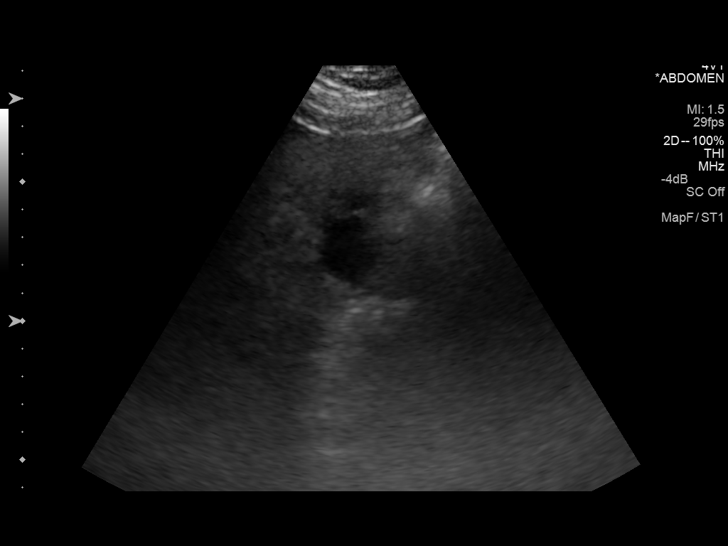
[im 51/77]
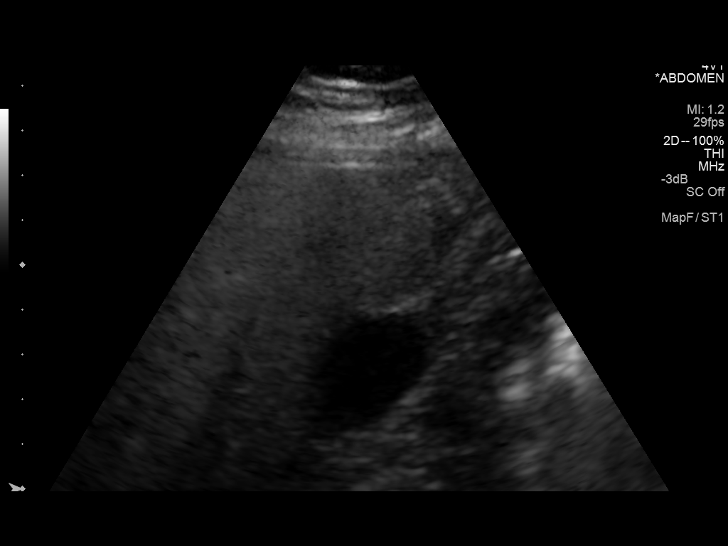
[im 58/77]
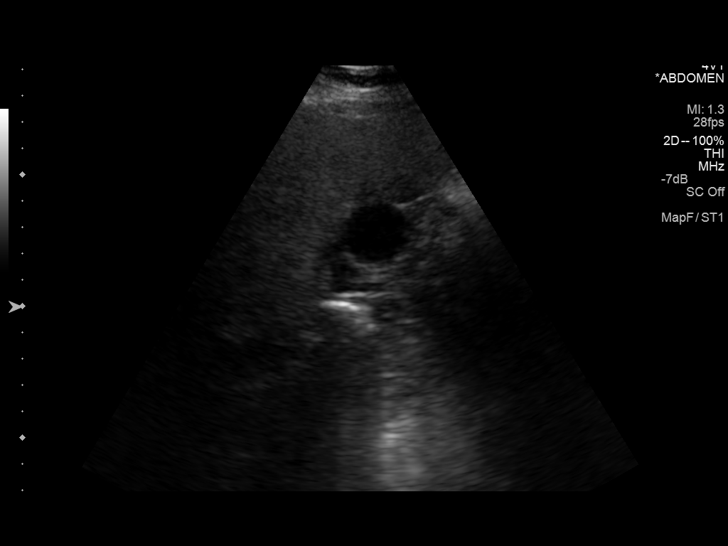
[im 64/77]
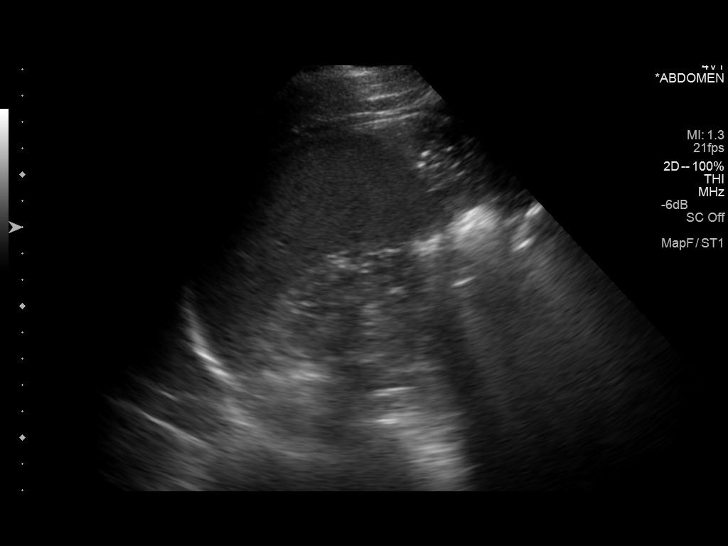
[im 70/77]
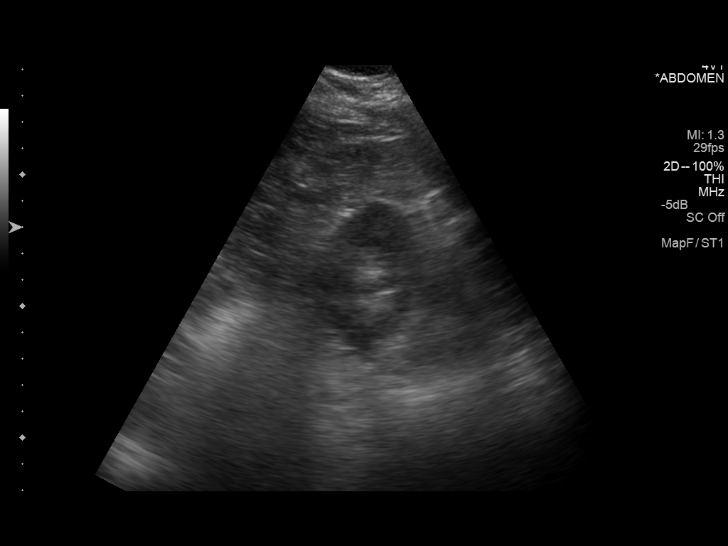
[im 77/77]
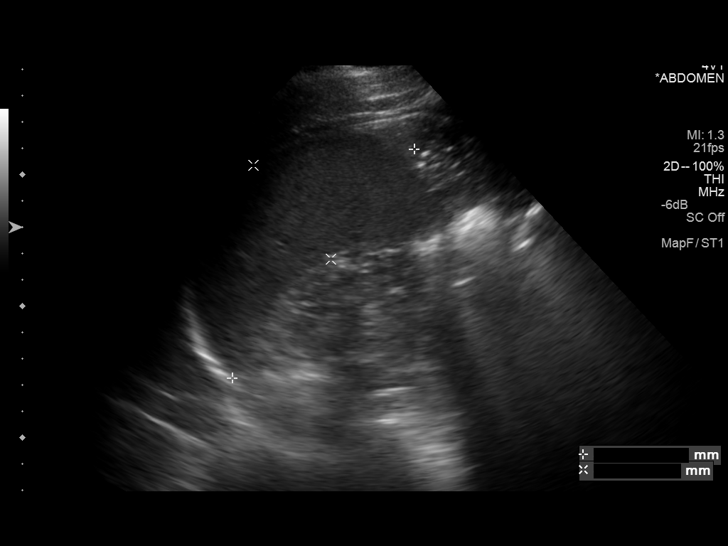

[14 of 25 positions shown; findings below may reference images not displayed]

FINDINGS: Gallbladder: Small amount of gallbladder sludge in small gallstones.
These were seen to move by the sonographer. Negative sonographic
Murphy sign. Gallbladder wall not thickened.

Common bile duct: Diameter: 3.8 mm

Liver: Diffusely echogenic liver difficult to penetrate. No focal
liver lesion.

IVC: Limited

Pancreas: Visualized portion unremarkable.

Spleen: Size and appearance within normal limits.

Right Kidney: Length: 10.7 cm. Echogenicity within normal limits. No
mass or hydronephrosis visualized.

Left Kidney: Length: 11.9 cm. Echogenicity within normal limits. No
mass or hydronephrosis visualized.

Abdominal aorta: Limited

Other findings: None.
IMPRESSION: Small gallstones are present within the gallbladder. Gallbladder
wall not thickened. Bile ducts nondilated

Fatty liver.

## 2013-12-30 MED ORDER — RANITIDINE HCL 150 MG PO TABS: 150.0000 mg | ORAL_TABLET | Freq: Every day | ORAL | Status: DC

## 2013-12-30 MED ORDER — PANTOPRAZOLE SODIUM 20 MG PO TBEC: 20.0000 mg | DELAYED_RELEASE_TABLET | Freq: Two times a day (BID) | ORAL | Status: DC

## 2013-12-30 MED ORDER — ONDANSETRON 4 MG PO TBDP: 4.0000 mg | ORAL_TABLET | Freq: Three times a day (TID) | ORAL | Status: DC | PRN

## 2013-12-30 MED ORDER — PANTOPRAZOLE SODIUM 20 MG PO TBEC
20.0000 mg | DELAYED_RELEASE_TABLET | Freq: Two times a day (BID) | ORAL | Status: DC
Start: 2013-12-30 — End: 2014-03-11

## 2013-12-30 MED ORDER — ONDANSETRON 4 MG PO TBDP
4.0000 mg | ORAL_TABLET | Freq: Once | ORAL | Status: AC
  Administered 2013-12-30: 4 mg via ORAL

## 2013-12-30 MED ORDER — HYDROCODONE-ACETAMINOPHEN 5-325 MG PO TABS: 1.0000 | ORAL_TABLET | Freq: Three times a day (TID) | ORAL | Status: DC | PRN

## 2013-12-30 NOTE — Progress Notes (Signed)
Chief Complaint:  Chief Complaint  Patient presents with  . right abdominal pain    found out he had gallstones today.  had ct scan  . feels like he going to pass out    dizzy    HPI: Shane Cole. is a 33 y.o. male who is here for right abd pain, he called his GI doctor Dr Shane Cole and he got labs done bu tnot CBC. He also got a [place] of his abd and was founnd to have gallstones but not acute cholelithiasis.  He states he ahs ahd abd pain for a long time now and it has been associated with nonbloody diarrhea. HE is being worked up by Dr Shane Cole. He is here today because he was puttinghis daughter's trampoline together and felt nausea and chills and felt dizzy and felt like he was going to faint when he twisted while tryign to put the trampoline together. He has right upper and right mid abd  pain, HE is getting a endoscopy and also colonscopy. He still has his appendix. He is able to move around now. He has had very loose, straight t water like BMs  x 7 episodes this morning, different color, with Little bits and pieces of stool.   Marland Kitchenlast  Lab Results  Component Value Date   CHOL 185 03/29/2012   Lab Results  Component Value Date   HDL 56.00 03/29/2012   Lab Results  Component Value Date   LDLCALC 94 03/29/2012   Lab Results  Component Value Date   TRIG 173.0* 03/29/2012   Lab Results  Component Value Date   CHOLHDL 3 03/29/2012   No results found for this basename: LDLDIRECT      IMPRESSION:  Small gallstones are present within the gallbladder. Gallbladder  wall not thickened. Bile ducts nondilated  Fatty liver.  Electronically Signed  By: Shane Cole M.D.  On: 12/30/2013 12:06   Past Medical History  Diagnosis Date  . Nerve pain   . Crush injury lower leg     Left lower leg  . Depression   . Migraine   . Bipolar disorder   . Allergy   . Anxiety   . Gastric ulcer   . GERD (gastroesophageal reflux disease)    Past Surgical History  Procedure  Laterality Date  . Mass excision Left 01/25/2013    Procedure: EXCISION MASS DORSAL ASPECT LEFT LONG FINGER DISTAL INTERPHALANGEAL JOINT;  Surgeon: Cammie Sickle., MD;  Location: [facility];  Service: Orthopedics;  Laterality: Left;  Left long   . Mouth surgery     History   Social History  . Marital Status: Married    Spouse Name: N/A    Number of Children: 1  . Years of Education: 10th grade   Occupational History  . Disabled     Previously worked Medical sales representative   Social History Main Topics  . Smoking status: Current Every Day Smoker -- 1.00 packs/day  . Smokeless tobacco: Never Used  . Alcohol Use: Yes     Comment: social  . Drug Use: No  . Sexual Activity: Yes    Partners: Female   Other Topics Concern  . None   Social History Narrative   05/24/12  [name] was born and grew up in [place], [place]. He has 2 sisters. He reports that his childhood was "lousy." He completed the 10th grade. He has been married for 5 years, and is  currently separated for 3 weeks. He has a daughter who is 52-1/2 years old. He has been unemployed for 2 years, and is disabled due to an on-the-job work accident. He is currently living with his aunt and cousin. He reports that his hobbies are sports and dogs. He reports that he is spiritual, but not religious. He states that his aunt and his father are his social support system. He denies any current legal problems, but got a DUI in 2007. 05/24/12 [facility]         Family History  Problem Relation Age of Onset  . Hyperlipidemia Father   . Hypertension Father   . Cancer Paternal Grandfather     lung, colon  . Stroke    . Heart disease    . Diabetes Paternal Grandmother   . Cancer Paternal Grandmother   . Cancer Maternal Grandmother   . Cancer Maternal Grandfather   . Depression Paternal Aunt   . Anxiety disorder Paternal Aunt   . Anxiety disorder Father   . Drug abuse Father   . Drug abuse Paternal Uncle     Allergies  Allergen Reactions  . Aspirin     Tinnitus, dizzy, short of breath  . Codeine Itching  . Penicillins Anaphylaxis  . Amitriptyline Rash   Prior to Admission medications   Medication Sig Start Date End Date Taking? Authorizing Provider  acetaminophen (TYLENOL) 500 MG tablet Take 500 mg by mouth every 6 (six) hours as needed for mild pain or headache.   Yes Historical Provider, MD  gabapentin (NEURONTIN) 600 MG tablet Take 600 mg by mouth 3 (three) times daily.   Yes Historical Provider, MD  ibuprofen (ADVIL,MOTRIN) 800 MG tablet Take 800 mg by mouth every 8 (eight) hours as needed for headache or moderate pain.   Yes Historical Provider, MD  MOVIPREP 100 G SOLR Take 1 kit (200 g total) by mouth once. 12/20/13  Yes Milus Banister, MD  Multiple Vitamin (MULTIVITAMIN WITH MINERALS) TABS tablet Take 1 tablet by mouth daily.   Yes Historical Provider, MD  nortriptyline (PAMELOR) 25 MG capsule Take 25 mg by mouth at bedtime.   Yes Historical Provider, MD  pantoprazole (PROTONIX) 20 MG tablet Take 1 tablet (20 mg total) by mouth 2 (two) times daily. 12/30/13  Yes Milus Banister, MD  ranitidine (ZANTAC) 150 MG tablet Take 1 tablet (150 mg total) by mouth at bedtime. 12/30/13  Yes Milus Banister, MD     ROS: The patient denies fevers,  night sweats, unintentional weight loss, chest pain, palpitations, wheezing, dyspnea on exertion,   dysuria, hematuria, melena, numbness, weakness, or tingling.   All other systems have been reviewed and were otherwise negative with the exception of those mentioned in the HPI and as above.    PHYSICAL EXAM: Filed Vitals:   12/30/13 1628  BP: 132/82  Pulse: 105  Temp: 97.9 F (36.6 C)  Resp: 18   Filed Vitals:   12/30/13 1628  Height: 5' 10"  (1.778 m)  Weight: 241 lb (109.317 kg)   Body mass index is 34.58 kg/(m^2).  General: Alert, no acute distress HEENT:  Normocephalic, atraumatic, oropharynx patent. EOMI, PERRLA Cardiovascular:   Regular rate and rhythm, no rubs murmurs or gallops.  No Carotid bruits, radial pulse intact. No pedal edema.  Respiratory: Clear to auscultation bilaterally.  No wheezes, rales, or rhonchi.  No cyanosis, no use of accessory musculature GI: No organomegaly, abdomen is soft and non-tender, positive bowel sounds.  No masses.  Not an acute abdomen Skin: No rashes. Neurologic: Facial musculature symmetric. Psychiatric: Patient is appropriate throughout our interaction. Lymphatic: No cervical lymphadenopathy Musculoskeletal: Gait intact.   LABS:   Results for orders placed in visit on 12/30/13  POCT CBC      Result Value Ref Range   WBC 10.7 (*) 4.6 - 10.2 K/uL   Lymph, poc 2.7  0.6 - 3.4   POC LYMPH PERCENT 25.0  10 - 50 %L   MID (cbc) 0.7  0 - 0.9   POC MID % 6.1  0 - 12 %M   POC Granulocyte 5.46  2 - 6.9   Granulocyte percent 68.9  37 - 80 %G   RBC 5.46  4.69 - 6.13 M/uL   Hemoglobin 16.4  14.1 - 18.1 g/dL   HCT, POC 50.2  43.5 - 53.7 %   MCV 92.0  80 - 97 fL   MCH, POC 30.0  27 - 31.2 pg   MCHC 32.6  31.8 - 35.4 g/dL   RDW, POC 13.3     Platelet Count, POC 298  142 - 424 K/uL   MPV 7.3  0 - 99.8 fL  POCT UA - MICROSCOPIC ONLY      Result Value Ref Range   WBC, Ur, HPF, POC neg     RBC, urine, microscopic neg     Bacteria, U Microscopic neg     Mucus, UA neg     Epithelial cells, urine per micros neg     Crystals, Ur, HPF, POC neg     Casts, Ur, LPF, POC neg     Yeast, UA neg    POCT URINALYSIS DIPSTICK      Result Value Ref Range   Color, UA yellow     Clarity, UA clear     Glucose, UA neg     Bilirubin, UA neg     Ketones, UA neg     Spec Grav, UA 1.010     Blood, UA neg     pH, UA 6.0     Protein, UA neg     Urobilinogen, UA 0.2     Nitrite, UA neg     Leukocytes, UA Negative       EKG/XRAY:   Primary read interpreted by Dr. Marin Comment at [facility]. + moderate stoll, no obstruction   ASSESSMENT/PLAN: Encounter Diagnoses  Name Primary?  . Nausea and vomiting,  vomiting of unspecified type   . Gallstones   . Abdominal pain, right upper quadrant Yes  . Other constipation    33 y.o male with a longstanding hx of chronic diffuse abd pain and diarrhea being followed by Dr Shane Cole. He has seen Dr Shane Cole just once. He is scheduled for endo and colonscopy for this. He had RUQ abd pain today while working on his daughters trampoline and subsequently got a CMP, lipase and also abd [place].  Recent Results (from the past 2160 hour(s))  CBC WITH DIFFERENTIAL     Status: Abnormal   Collection Time    12/20/13  3:40 PM      Result Value Ref Range   WBC 11.4 (*) 4.0 - 10.5 K/uL   RBC 5.17  4.22 - 5.81 Mil/uL   Hemoglobin 15.8  13.0 - 17.0 g/dL   HCT 46.8  39.0 - 52.0 %   MCV 90.7  78.0 - 100.0 fl   MCHC 33.7  30.0 - 36.0 g/dL   RDW 12.9  11.5 - 15.5 %   Platelets  261.0  150.0 - 400.0 K/uL   Neutrophils Relative % 69.4  43.0 - 77.0 %   Lymphocytes Relative 19.7  12.0 - 46.0 %   Monocytes Relative 5.7  3.0 - 12.0 %   Eosinophils Relative 4.8  0.0 - 5.0 %   Basophils Relative 0.4  0.0 - 3.0 %   Neutro Abs 7.9 (*) 1.4 - 7.7 K/uL   Lymphs Abs 2.2  0.7 - 4.0 K/uL   Monocytes Absolute 0.6  0.1 - 1.0 K/uL   Eosinophils Absolute 0.6  0.0 - 0.7 K/uL   Basophils Absolute 0.0  0.0 - 0.1 K/uL  COMPREHENSIVE METABOLIC PANEL     Status: Abnormal   Collection Time    12/20/13  3:40 PM      Result Value Ref Range   Sodium 138  135 - 145 mEq/L   Potassium 4.5  3.5 - 5.1 mEq/L   Chloride 102  96 - 112 mEq/L   CO2 30  19 - 32 mEq/L   Glucose, Bld 94  70 - 99 mg/dL   BUN 16  6 - 23 mg/dL   Creatinine, Ser 1.3  0.4 - 1.5 mg/dL   Total Bilirubin 0.3  0.2 - 1.2 mg/dL   Alkaline Phosphatase 78  39 - 117 U/L   AST 32  0 - 37 U/L   ALT 85 (*) 0 - 53 U/L   Total Protein 7.9  6.0 - 8.3 g/dL   Albumin 4.7  3.5 - 5.2 g/dL   Calcium 9.9  8.4 - 10.5 mg/dL   GFR 70.14  >60.00 mL/min  HEPATIC FUNCTION PANEL     Status: Abnormal   Collection Time    12/30/13  9:26 AM       Result Value Ref Range   Total Bilirubin 0.7  0.2 - 1.2 mg/dL   Bilirubin, Direct 0.1  0.0 - 0.3 mg/dL   Alkaline Phosphatase 68  39 - 117 U/L   AST 35  0 - 37 U/L   ALT 82 (*) 0 - 53 U/L   Total Protein 8.0  6.0 - 8.3 g/dL   Albumin 4.1  3.5 - 5.2 g/dL  COMPREHENSIVE METABOLIC PANEL     Status: Abnormal   Collection Time    12/30/13  9:26 AM      Result Value Ref Range   Sodium 138  135 - 145 mEq/L   Potassium 4.2  3.5 - 5.1 mEq/L   Chloride 105  96 - 112 mEq/L   CO2 26  19 - 32 mEq/L   Glucose, Bld 101 (*) 70 - 99 mg/dL   BUN 15  6 - 23 mg/dL   Creatinine, Ser 1.0  0.4 - 1.5 mg/dL   Total Bilirubin 0.7  0.2 - 1.2 mg/dL   Alkaline Phosphatase 68  39 - 117 U/L   AST 35  0 - 37 U/L   ALT 82 (*) 0 - 53 U/L   Total Protein 8.0  6.0 - 8.3 g/dL   Albumin 4.1  3.5 - 5.2 g/dL   Calcium 9.5  8.4 - 10.5 mg/dL   GFR 93.97  >60.00 mL/min  AMYLASE     Status: None   Collection Time    12/30/13  9:26 AM      Result Value Ref Range   Amylase 57  27 - 131 U/L  LIPASE     Status: None   Collection Time    12/30/13  9:26 AM  Result Value Ref Range   Lipase 34.0  11.0 - 59.0 U/L  POCT CBC     Status: Abnormal   Collection Time    12/30/13  5:26 PM      Result Value Ref Range   WBC 10.7 (*) 4.6 - 10.2 K/uL   Lymph, poc 2.7  0.6 - 3.4   POC LYMPH PERCENT 25.0  10 - 50 %L   MID (cbc) 0.7  0 - 0.9   POC MID % 6.1  0 - 12 %M   POC Granulocyte 5.46  2 - 6.9   Granulocyte percent 68.9  37 - 80 %G   RBC 5.46  4.69 - 6.13 M/uL   Hemoglobin 16.4  14.1 - 18.1 g/dL   HCT, POC 50.2  43.5 - 53.7 %   MCV 92.0  80 - 97 fL   MCH, POC 30.0  27 - 31.2 pg   MCHC 32.6  31.8 - 35.4 g/dL   RDW, POC 13.3     Platelet Count, POC 298  142 - 424 K/uL   MPV 7.3  0 - 99.8 fL  POCT URINALYSIS DIPSTICK     Status: None   Collection Time    12/30/13  5:29 PM      Result Value Ref Range   Color, UA yellow     Clarity, UA clear     Glucose, UA neg     Bilirubin, UA neg     Ketones, UA neg     Spec  Grav, UA 1.010     Blood, UA neg     pH, UA 6.0     Protein, UA neg     Urobilinogen, UA 0.2     Nitrite, UA neg     Leukocytes, UA Negative    POCT UA - MICROSCOPIC ONLY     Status: None   Collection Time    12/30/13  5:35 PM      Result Value Ref Range   WBC, Ur, HPF, POC neg     RBC, urine, microscopic neg     Bacteria, U Microscopic neg     Mucus, UA neg     Epithelial cells, urine per micros neg     Crystals, Ur, HPF, POC neg     Casts, Ur, LPF, POC neg     Yeast, UA neg     33 y.o male with waht sounds like chronic diffuse abd pain and nonbloody diarrhea, followed by Dr Shane Cole, he has seen him once, there is a endocscopy and colonscopy scheduled. Today he had CMP , lipase and [place] abd doen. Labs were normal and his US shows gallstones.  He is here because ehe ahd RUQ abd pain, nause and some dizziness when he was putting his daughter's trampoline together for her birthday, he does not want to miss her birthday, he sttes the pain is similar to what he has had before.  IV fluids x 1, zofranodt. He felt better afterwards.  Norco x 6 pills for pain control prn for his daughter's birthday Monitor for Worsening sxs. Precautiosn given to go to the ER since still has appendix Rx also zofran Push fluids, precautions given Take miralax prn  Gross sideeffects, risk and benefits, and alternatives of medications d/w patient. Patient is aware that all medications have potential sideeffects and we are unable to predict every sideeffect or drug-drug interaction that may occur.  Shane Cole, [place], DO 12/30/2013 7:09 PM

## 2013-12-30 NOTE — Patient Instructions (Signed)

## 2013-12-30 NOTE — Telephone Encounter (Signed)
No new symptoms to report. He takes ranitidine twice daily but still has indigestion, burping and reflux. States he vomits almost daily. he awakens with sharp pain radiating into his back. He had his u/s done today. Advised to continue with low fat diet avoiding foods he knows irritate his stomach. Is there anything else he can do?

## 2013-12-30 NOTE — Telephone Encounter (Signed)
FYI

## 2013-12-30 NOTE — Telephone Encounter (Signed)
Not clear what is causing his pains.  His labs were fairly normal. His ultrasound does show gallstones in his gallbladder. I'm not sure if those are causing all or even any of his symptoms.  I do think he needs general surgery referral for their opinion on this but he should still plan on EGD and colonoscopy as we already discussed.    He should increase his protonix to one pill twice daily (OTC is fine, should take 1 pill 20-30 min prior to BF and dinner meals) and also change the ranitidine to bedtime dosing only.

## 2013-12-30 NOTE — Telephone Encounter (Signed)
Patient advised. New rx escrib[facility]. Note and request for an appointment faxed to [facility].

## 2014-01-03 ENCOUNTER — Encounter (HOSPITAL_COMMUNITY): Payer: Self-pay | Admitting: Emergency Medicine

## 2014-01-03 ENCOUNTER — Telehealth: Payer: Self-pay | Admitting: Gastroenterology

## 2014-01-03 ENCOUNTER — Emergency Department (HOSPITAL_COMMUNITY)
Admission: EM | Admit: 2014-01-03 | Discharge: 2014-01-03 | Disposition: A | Discharge status: Home / Self Care | Payer: 59 | Attending: Emergency Medicine | Admitting: Emergency Medicine

## 2014-01-03 ENCOUNTER — Emergency Department (HOSPITAL_COMMUNITY): Payer: 59

## 2014-01-03 DIAGNOSIS — Z880 Allergy status to penicillin: Secondary | ICD-10-CM | POA: Diagnosis not present

## 2014-01-03 DIAGNOSIS — G43909 Migraine, unspecified, not intractable, without status migrainosus: Secondary | ICD-10-CM | POA: Insufficient documentation

## 2014-01-03 DIAGNOSIS — F419 Anxiety disorder, unspecified: Secondary | ICD-10-CM | POA: Diagnosis not present

## 2014-01-03 DIAGNOSIS — R112 Nausea with vomiting, unspecified: Secondary | ICD-10-CM | POA: Diagnosis not present

## 2014-01-03 DIAGNOSIS — Z8739 Personal history of other diseases of the musculoskeletal system and connective tissue: Secondary | ICD-10-CM | POA: Diagnosis not present

## 2014-01-03 DIAGNOSIS — Z720 Tobacco use: Secondary | ICD-10-CM | POA: Diagnosis not present

## 2014-01-03 DIAGNOSIS — R1033 Periumbilical pain: Secondary | ICD-10-CM | POA: Insufficient documentation

## 2014-01-03 DIAGNOSIS — K219 Gastro-esophageal reflux disease without esophagitis: Secondary | ICD-10-CM | POA: Insufficient documentation

## 2014-01-03 DIAGNOSIS — R197 Diarrhea, unspecified: Secondary | ICD-10-CM | POA: Insufficient documentation

## 2014-01-03 DIAGNOSIS — F319 Bipolar disorder, unspecified: Secondary | ICD-10-CM | POA: Diagnosis not present

## 2014-01-03 DIAGNOSIS — Z79899 Other long term (current) drug therapy: Secondary | ICD-10-CM | POA: Diagnosis not present

## 2014-01-03 DIAGNOSIS — R109 Unspecified abdominal pain: Secondary | ICD-10-CM | POA: Diagnosis present

## 2014-01-03 DIAGNOSIS — R1032 Left lower quadrant pain: Secondary | ICD-10-CM | POA: Diagnosis not present

## 2014-01-03 DIAGNOSIS — R1031 Right lower quadrant pain: Secondary | ICD-10-CM | POA: Insufficient documentation

## 2014-01-03 DIAGNOSIS — R1030 Lower abdominal pain, unspecified: Secondary | ICD-10-CM

## 2014-01-03 DIAGNOSIS — Z87828 Personal history of other (healed) physical injury and trauma: Secondary | ICD-10-CM | POA: Diagnosis not present

## 2014-01-03 LAB — URINALYSIS, ROUTINE W REFLEX MICROSCOPIC
Bilirubin Urine: NEGATIVE
GLUCOSE, UA: NEGATIVE mg/dL
Hgb urine dipstick: NEGATIVE
KETONES UR: NEGATIVE mg/dL
Leukocytes, UA: NEGATIVE
Nitrite: NEGATIVE
PH: 6.5 (ref 5.0–8.0)
Protein, ur: NEGATIVE mg/dL
Specific Gravity, Urine: 1.009 (ref 1.005–1.030)
Urobilinogen, UA: 0.2 mg/dL (ref 0.0–1.0)

## 2014-01-03 LAB — CBC WITH DIFFERENTIAL/PLATELET
BASOS ABS: 0 10*3/uL (ref 0.0–0.1)
Basophils Relative: 0 % (ref 0–1)
EOS PCT: 3 % (ref 0–5)
Eosinophils Absolute: 0.4 10*3/uL (ref 0.0–0.7)
HCT: 48 % (ref 39.0–52.0)
Hemoglobin: 16.6 g/dL (ref 13.0–17.0)
Lymphocytes Relative: 19 % (ref 12–46)
Lymphs Abs: 2.2 10*3/uL (ref 0.7–4.0)
MCH: 31.1 pg (ref 26.0–34.0)
MCHC: 34.6 g/dL (ref 30.0–36.0)
MCV: 90.1 fL (ref 78.0–100.0)
Monocytes Absolute: 0.4 10*3/uL (ref 0.1–1.0)
Monocytes Relative: 4 % (ref 3–12)
NEUTROS PCT: 74 % (ref 43–77)
Neutro Abs: 8.4 10*3/uL — ABNORMAL HIGH (ref 1.7–7.7)
Platelets: 264 10*3/uL (ref 150–400)
RBC: 5.33 MIL/uL (ref 4.22–5.81)
RDW: 12.4 % (ref 11.5–15.5)
WBC: 11.4 10*3/uL — ABNORMAL HIGH (ref 4.0–10.5)

## 2014-01-03 LAB — COMPREHENSIVE METABOLIC PANEL
ALT: 96 U/L — AB (ref 0–53)
AST: 41 U/L — AB (ref 0–37)
Albumin: 4.7 g/dL (ref 3.5–5.2)
Alkaline Phosphatase: 85 U/L (ref 39–117)
Anion gap: 18 — ABNORMAL HIGH (ref 5–15)
BUN: 13 mg/dL (ref 6–23)
CALCIUM: 10.3 mg/dL (ref 8.4–10.5)
CO2: 25 meq/L (ref 19–32)
Chloride: 98 mEq/L (ref 96–112)
Creatinine, Ser: 1.03 mg/dL (ref 0.50–1.35)
GFR calc Af Amer: >90 mL/min (ref >90)
GFR calc non Af Amer: >90 mL/min (ref >90)
Glucose, Bld: 103 mg/dL — ABNORMAL HIGH (ref 70–99)
POTASSIUM: 4.6 meq/L (ref 3.7–5.3)
SODIUM: 141 meq/L (ref 137–147)
Total Bilirubin: 0.3 mg/dL (ref 0.3–1.2)
Total Protein: 8.3 g/dL (ref 6.0–8.3)

## 2014-01-03 LAB — I-STAT CG4 LACTIC ACID, ED: LACTIC ACID, VENOUS: 2.11 mmol/L (ref 0.5–2.2)

## 2014-01-03 LAB — LIPASE, BLOOD: Lipase: 17 U/L (ref 11–59)

## 2014-01-03 IMAGING — CT CT ABD-PELV W/ CM
1 of 2 series · 15 of 32 positions shown, 19 images · IV contrast (OMNIPAQUE 300)
Comparison: Radiographs [DATE]. CT [DATE]. Ultrasound
[DATE].

CLINICAL DATA: RIGHT lower quadrant pain for 3 days. Hernia.
Appendicitis. Ureteral stone.

EXAM:
CT ABDOMEN AND PELVIS WITH CONTRAST
TECHNIQUE: Multidetector CT imaging of the abdomen and pelvis was performed
using the standard protocol following bolus administration of
intravenous contrast.
CONTRAST:  50mL OMNIPAQUE IOHEXOL 300 MG/ML SOLN, 100mL OMNIPAQUE
IOHEXOL 300 MG/ML SOLN

[Series 2: abd/pel with · axial · 0.90mm/px · z∈[+1038,+1528]mm · 15 of 108 slices shown, 19 images]
[im 5/108  soft-tissue]
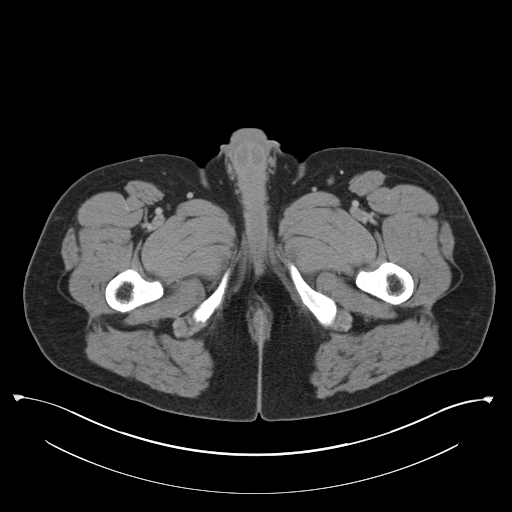
[im 5/108  bone]
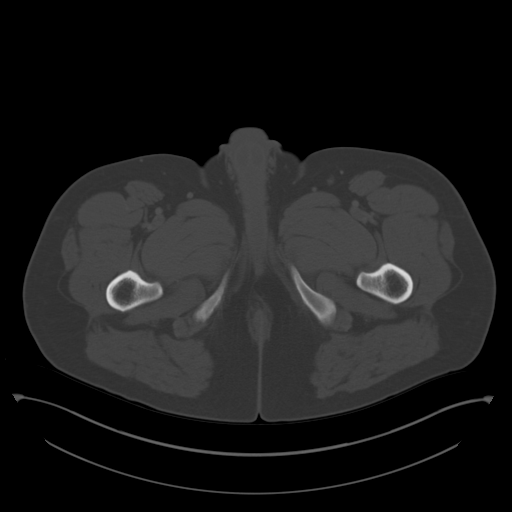
[im 13/108  soft-tissue]
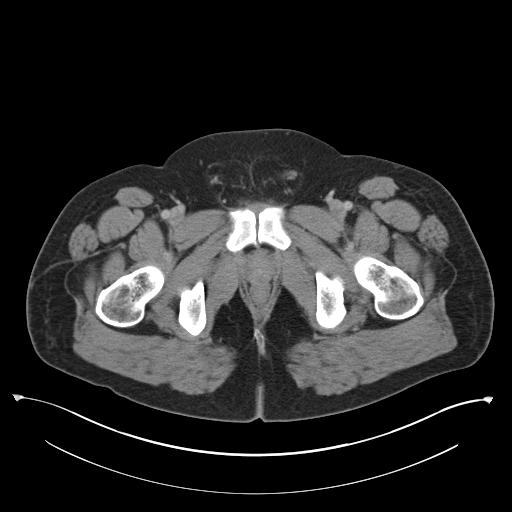
[im 22/108  soft-tissue]
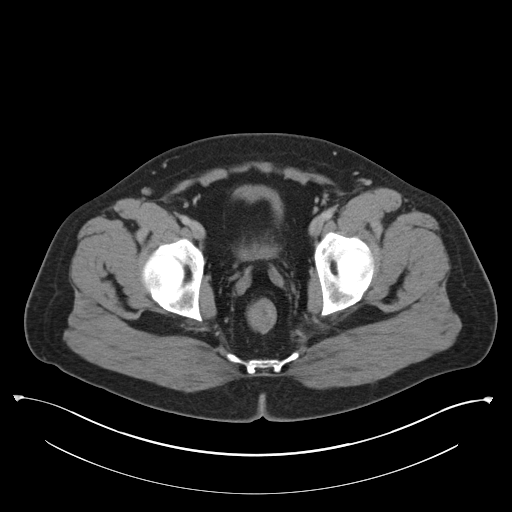
[im 30/108  soft-tissue]
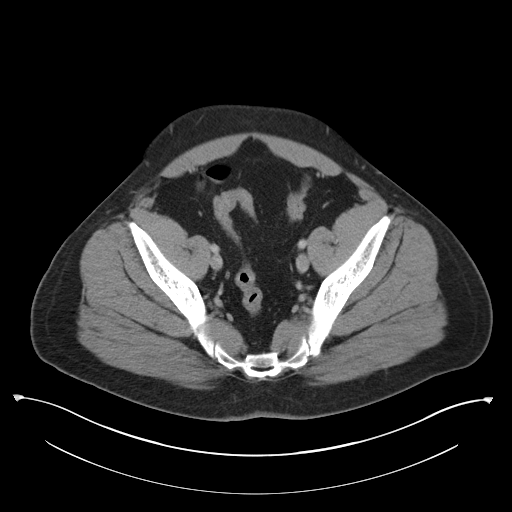
[im 39/108  soft-tissue]
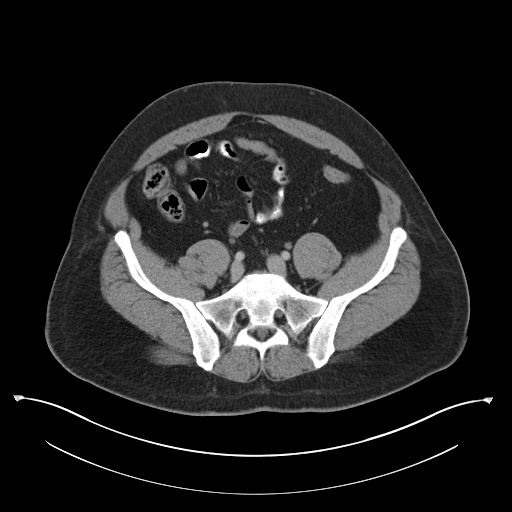
[im 48/108  soft-tissue]
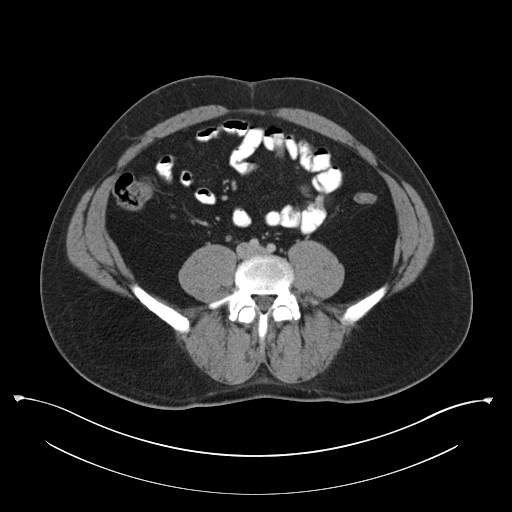
[im 56/108  soft-tissue]
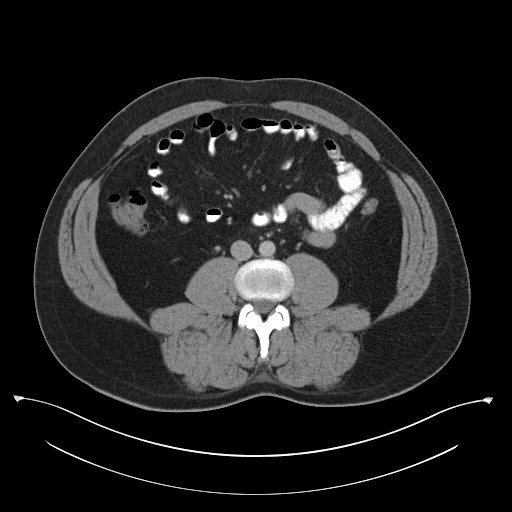
[im 60/108  soft-tissue]
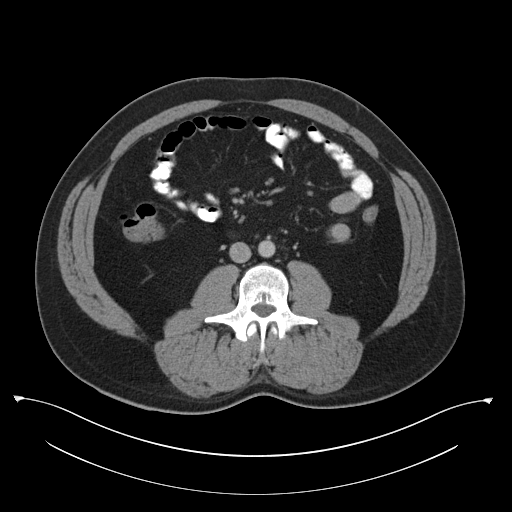
[im 69/108  soft-tissue]
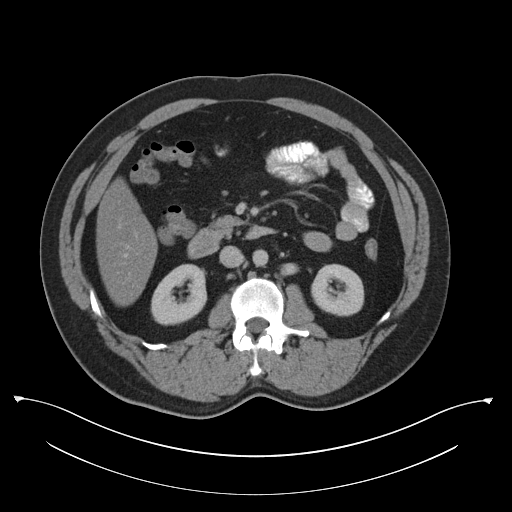
[im 69/108  bone]
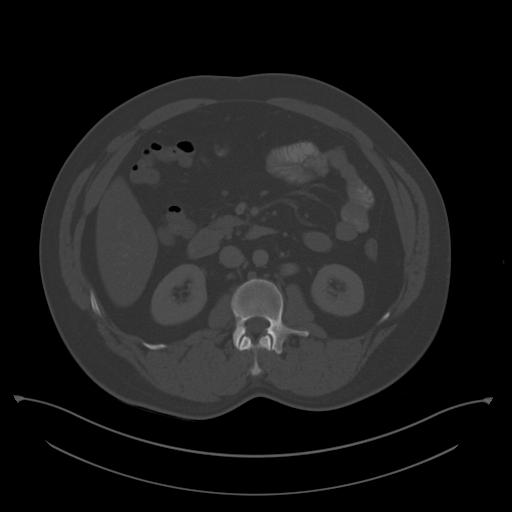
[im 78/108  soft-tissue]
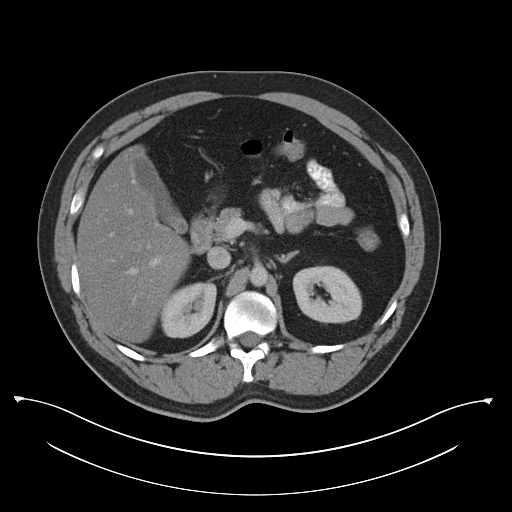
[im 86/108  soft-tissue]
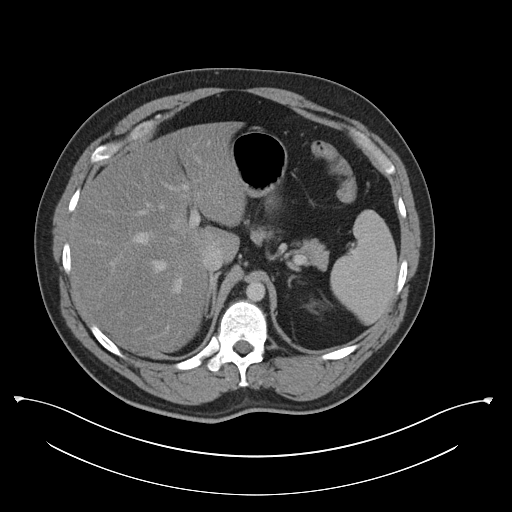
[im 90/108  lung]
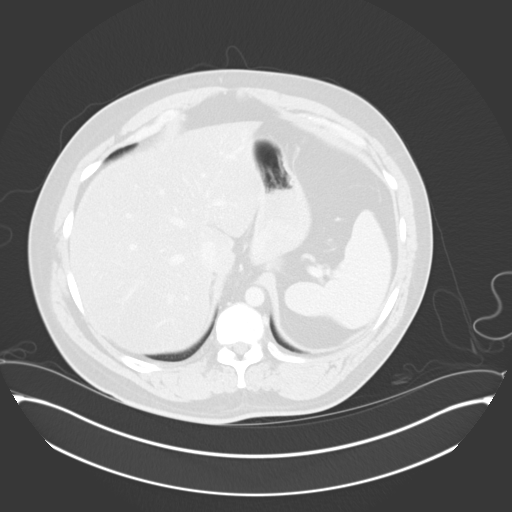
[im 95/108  soft-tissue]
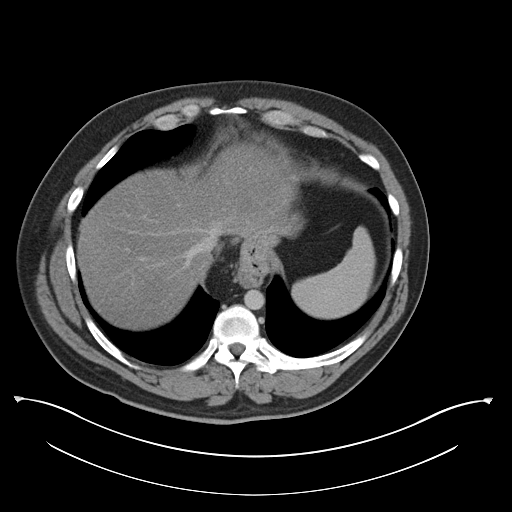
[im 95/108  lung]
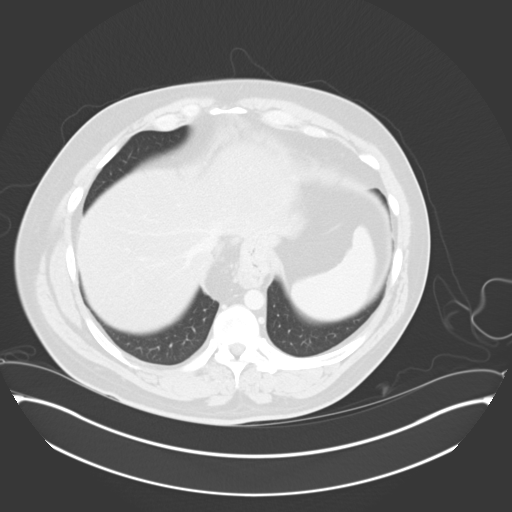
[im 99/108  lung]
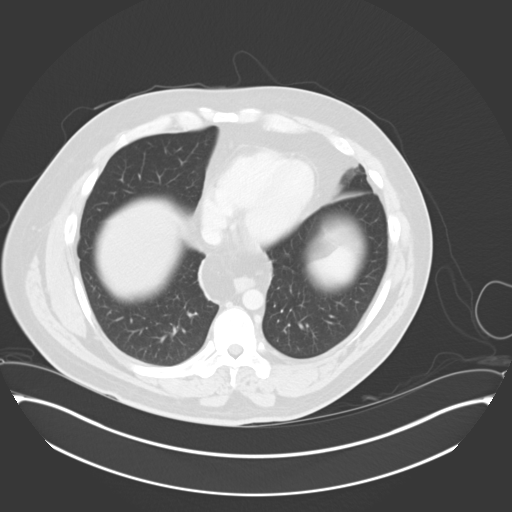
[im 103/108  soft-tissue]
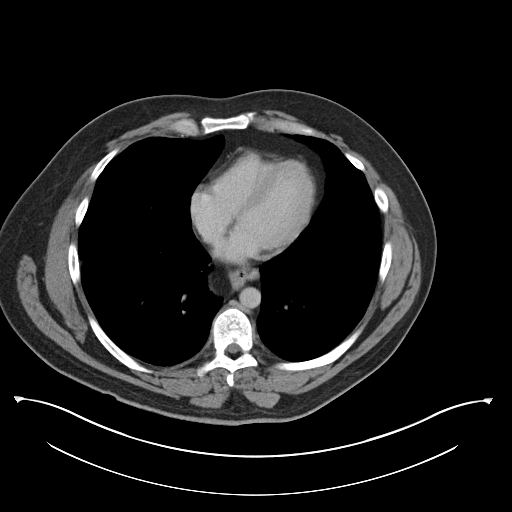
[im 103/108  lung]
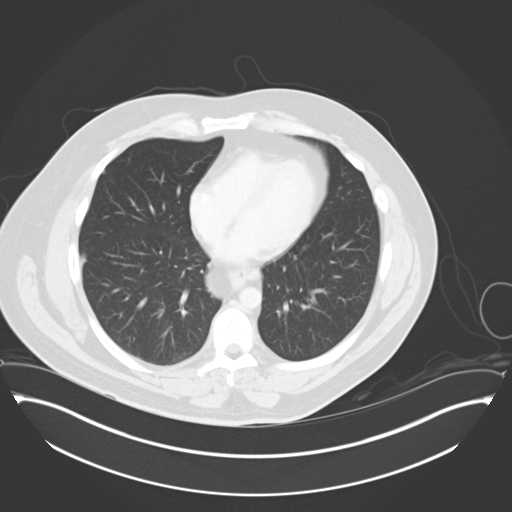

[15 of 32 positions shown; findings below may reference images not displayed]

FINDINGS: Musculoskeletal: No destructive osseous lesions. Calcified disc
protrusion at L5-S1. Levoconvex lumbar curve.

Lung Bases: Clear.  Fat containing hiatal hernia.

Liver:  Fatty liver.  Hepatomegaly with 19.5 cm liver span.

Spleen:  Normal.

Gallbladder:  Normal.

Common bile duct:  Normal.

Pancreas:  Normal.

Adrenal glands:  Normal.

Kidneys: Normal bilaterally. Negative for nephrolithiasis. Both
ureters appear normal.

Stomach:  Small hiatal hernia.

Small bowel:  Normal.

Colon: Normal appendix. No inflammatory changes of colon. Most of
the colon is decompressed.

Pelvic Genitourinary:  Normal.

Vasculature: Retroaortic LEFT renal vein incidentally noted.

Body Wall: Fat containing periumbilical hernia.
IMPRESSION: 1. Negative for appendicitis.
2. Negative for urolithiasis.
3. Hepatosteatosis and hepatomegaly.

## 2014-01-03 MED ORDER — OXYCODONE HCL 5 MG PO TABS: 5.0000 mg | ORAL_TABLET | ORAL | Status: DC | PRN

## 2014-01-03 MED ORDER — DOCUSATE SODIUM 100 MG PO CAPS: 100.0000 mg | ORAL_CAPSULE | Freq: Two times a day (BID) | ORAL | Status: DC

## 2014-01-03 MED ORDER — ONDANSETRON 4 MG PO TBDP: 4.0000 mg | ORAL_TABLET | Freq: Once | ORAL | Status: DC

## 2014-01-03 MED ORDER — IOHEXOL 300 MG/ML  SOLN
100.0000 mL | Freq: Once | INTRAMUSCULAR | Status: AC | PRN
  Administered 2014-01-03: 100 mL via INTRAVENOUS

## 2014-01-03 MED ORDER — SODIUM CHLORIDE 0.9 % IV BOLUS (SEPSIS)
1000.0000 mL | Freq: Once | INTRAVENOUS | Status: AC
  Administered 2014-01-03: 1000 mL via INTRAVENOUS

## 2014-01-03 MED ORDER — IOHEXOL 300 MG/ML  SOLN
50.0000 mL | Freq: Once | INTRAMUSCULAR | Status: AC | PRN
  Administered 2014-01-03: 50 mL via ORAL

## 2014-01-03 MED ORDER — ONDANSETRON HCL 4 MG/2ML IJ SOLN
4.0000 mg | Freq: Once | INTRAMUSCULAR | Status: AC
  Administered 2014-01-03: 4 mg via INTRAVENOUS
  Filled 2014-01-03: qty 2

## 2014-01-03 MED ORDER — OXYCODONE HCL 5 MG PO TABS
5.0000 mg | ORAL_TABLET | Freq: Once | ORAL | Status: AC
  Administered 2014-01-03: 5 mg via ORAL
  Filled 2014-01-03: qty 1

## 2014-01-03 MED ORDER — ONDANSETRON 4 MG PO TBDP: 4.0000 mg | ORAL_TABLET | Freq: Three times a day (TID) | ORAL | Status: DC | PRN

## 2014-01-03 NOTE — Telephone Encounter (Signed)
appt with Dr Derrell Lollingamirez 01/17/14  11 am [facility]  Pt aware

## 2014-01-03 NOTE — ED Provider Notes (Signed)
CSN: [id number]     Arrival date & time 01/03/14  1827 History   First MD Initiated Contact with Patient 01/03/14 1922     Chief Complaint  Patient presents with  . Abdominal Pain     (Consider location/radiation/quality/duration/timing/severity/associated sxs/prior Treatment) HPI Comments: Patient presents with complaint of lower back pain for 4 days. Patient does not have a history of abdominal surgeries. He states that pain began acutely after twisting while putting together a trampoline. Pain was sharp and stabbing. It radiated into right testicle and groin. Patient saw urgent care that day. He was requesting to go to the emergency department for further evaluation but refused at that time. Patient states that since then the pain has gradually worsened. He has had watery stools that he attributes to laxative use. No blood in stools. He has had nausea and decreased appetite but no vomiting or fever. Patient states that he has had several episodes of similar pain in the exact same spot in the past that has not been previously diagnosed. No dysuria or hematuria. No history of kidney stone. He has recently seen GI (Dr. Ardis Hughs) and has scheduled upper and lower endoscopy for his symptoms.   The history is provided by the patient.    Past Medical History  Diagnosis Date  . Nerve pain   . Crush injury lower leg     Left lower leg  . Depression   . Migraine   . Bipolar disorder   . Allergy   . Anxiety   . Gastric ulcer   . GERD (gastroesophageal reflux disease)    Past Surgical History  Procedure Laterality Date  . Mass excision Left 01/25/2013    Procedure: EXCISION MASS DORSAL ASPECT LEFT LONG FINGER DISTAL INTERPHALANGEAL JOINT;  Surgeon: Cammie Sickle., MD;  Location: [facility];  Service: Orthopedics;  Laterality: Left;  Left long   . Mouth surgery     Family History  Problem Relation Age of Onset  . Hyperlipidemia Father   . Hypertension Father   . Cancer  Paternal Grandfather     lung, colon  . Stroke    . Heart disease    . Diabetes Paternal Grandmother   . Cancer Paternal Grandmother   . Cancer Maternal Grandmother   . Cancer Maternal Grandfather   . Depression Paternal Aunt   . Anxiety disorder Paternal Aunt   . Anxiety disorder Father   . Drug abuse Father   . Drug abuse Paternal Uncle    History  Substance Use Topics  . Smoking status: Current Every Day Smoker -- 1.00 packs/day  . Smokeless tobacco: Never Used  . Alcohol Use: Yes     Comment: social    Review of Systems  Constitutional: Negative for fever.  HENT: Negative for rhinorrhea and sore throat.   Eyes: Negative for redness.  Respiratory: Negative for cough.   Cardiovascular: Negative for chest pain.  Gastrointestinal: Positive for nausea, vomiting, abdominal pain and diarrhea.  Genitourinary: Negative for dysuria.  Musculoskeletal: Negative for myalgias.  Skin: Negative for rash.  Neurological: Negative for headaches.    Allergies  Aspirin; Codeine; Penicillins; and Amitriptyline  Home Medications   Prior to Admission medications   Medication Sig Start Date End Date Taking? Authorizing Provider  acetaminophen (TYLENOL) 500 MG tablet Take 1,000 mg by mouth every 6 (six) hours as needed for mild pain or headache (stomach pain).    Yes Historical Provider, MD  gabapentin (NEURONTIN) 600 MG tablet Take  600 mg by mouth 3 (three) times daily.   Yes Historical Provider, MD  HYDROcodone-acetaminophen (NORCO/VICODIN) 5-325 MG per tablet Take 1 tablet by mouth every 8 (eight) hours as needed for moderate pain (pain).   Yes Historical Provider, MD  ibuprofen (ADVIL,MOTRIN) 800 MG tablet Take 800 mg by mouth every 8 (eight) hours as needed for headache or moderate pain (leg pain).    Yes Historical Provider, MD  MOVIPREP 100 G SOLR Take 1 kit (200 g total) by mouth once. 12/20/13  Yes Milus Banister, MD  Multiple Vitamin (MULTIVITAMIN WITH MINERALS) TABS tablet Take 1  tablet by mouth daily.   Yes Historical Provider, MD  nortriptyline (PAMELOR) 25 MG capsule Take 25 mg by mouth at bedtime.   Yes Historical Provider, MD  pantoprazole (PROTONIX) 20 MG tablet Take 1 tablet (20 mg total) by mouth 2 (two) times daily. 12/30/13  Yes Thao P Le, DO  ranitidine (ZANTAC) 150 MG tablet Take 1 tablet (150 mg total) by mouth at bedtime. 12/30/13  Yes Thao P Le, DO  ondansetron (ZOFRAN ODT) 4 MG disintegrating tablet Take 1 tablet (4 mg total) by mouth every 8 (eight) hours as needed for nausea or vomiting. 12/30/13   Thao P Le, DO   BP 136/83  Pulse 114  Temp(Src) 98.1 F (36.7 C) (Oral)  Resp 20  SpO2 100%  Physical Exam  Nursing note and vitals reviewed. Constitutional: He appears well-developed and well-nourished.  HENT:  Head: Normocephalic and atraumatic.  Eyes: Conjunctivae are normal. Right eye exhibits no discharge. Left eye exhibits no discharge.  Neck: Normal range of motion. Neck supple.  Cardiovascular: Normal rate, regular rhythm and normal heart sounds.   Pulmonary/Chest: Effort normal and breath sounds normal.  Abdominal: Soft. Bowel sounds are normal. He exhibits no distension. There is tenderness in the right lower quadrant, periumbilical area, suprapubic area and left lower quadrant. There is no rigidity, no rebound, no guarding, no CVA tenderness, no tenderness at McBurney's point and negative Murphy's sign. No hernia.    Genitourinary: Right testis shows no mass and no tenderness. Left testis shows no mass and no tenderness. Circumcised. No penile tenderness.  Neurological: He is alert.  Skin: Skin is warm and dry.  Psychiatric: He has a normal mood and affect.    ED Course  Procedures (including critical care time) Labs Review Labs Reviewed  CBC WITH DIFFERENTIAL - Abnormal; Notable for the following:    WBC 11.4 (*)    Neutro Abs 8.4 (*)    All other components within normal limits  COMPREHENSIVE METABOLIC PANEL - Abnormal; Notable for  the following:    Glucose, Bld 103 (*)    AST 41 (*)    ALT 96 (*)    Anion gap 18 (*)    All other components within normal limits  LIPASE, BLOOD  URINALYSIS, ROUTINE W REFLEX MICROSCOPIC  I-STAT CG4 LACTIC ACID, ED    Imaging Review Ct Abdomen Pelvis W Contrast  01/03/2014   CLINICAL DATA:  RIGHT lower quadrant pain for 3 days. Hernia. Appendicitis. Ureteral stone.  EXAM: CT ABDOMEN AND PELVIS WITH CONTRAST  TECHNIQUE: Multidetector CT imaging of the abdomen and pelvis was performed using the standard protocol following bolus administration of intravenous contrast.  CONTRAST:  15m OMNIPAQUE IOHEXOL 300 MG/ML SOLN, 1051mOMNIPAQUE IOHEXOL 300 MG/ML SOLN  COMPARISON:  Radiographs 12/30/2013. CT 10/26/2012. Ultrasound 12/30/2013.  FINDINGS: Musculoskeletal: No destructive osseous lesions. Calcified disc protrusion at L5-S1. Levoconvex lumbar curve.  Lung  Bases: Clear.  Fat containing hiatal hernia.  Liver:  Fatty liver.  Hepatomegaly with 19.5 cm liver span.  Spleen:  Normal.  Gallbladder:  Normal.  Common bile duct:  Normal.  Pancreas:  Normal.  Adrenal glands:  Normal.  Kidneys: Normal bilaterally. Negative for nephrolithiasis. Both ureters appear normal.  Stomach:  Small hiatal hernia.  Small bowel:  Normal.  Colon: Normal appendix. No inflammatory changes of colon. Most of the colon is decompressed.  Pelvic Genitourinary:  Normal.  Vasculature: Retroaortic LEFT renal vein incidentally noted.  Body Wall: Fat containing periumbilical hernia.  IMPRESSION: 1. Negative for appendicitis. 2. Negative for urolithiasis. 3. Hepatosteatosis and hepatomegaly.   Electronically Signed   By: Dereck Ligas M.D.   On: 01/03/2014 21:53     EKG Interpretation None      8:00 PM Patient seen and examined. Work-up initiated. Medications ordered.   Vital signs reviewed and are as follows: BP 136/83  Pulse 114  Temp(Src) 98.1 F (36.7 C) (Oral)  Resp 20  SpO2 100%  11:07 PM Pt informed of results. Will  give pain medication, nausea medication, and colace for home. Encouraged GI f/u as planned and PCP f/u for recheck.   The patient was urged to return to the Emergency Department immediately with worsening of current symptoms, worsening abdominal pain, persistent vomiting, blood noted in stools, fever, or any other concerns. The patient verbalized understanding.   BP 140/73  Pulse 98  Temp(Src) 98 F (36.7 C) (Oral)  Resp 18  SpO2 100%  MDM   Final diagnoses:  Lower abdominal pain   Patient with lower abdominal pain, recurrent. CT neg for surgical emergency/infection. No indications for admission. Abd is soft. Symptoms are controlled. He had constipation but now stool is clear after laxatives. He appears well, non-toxic. He is currently undergoing GI eval. Feel safe for d/c to home with symptom control.  He is aware of elevated transaminases and will have this followed by GI.   No dangerous or life-threatening conditions suspected or identified by history, physical exam, and by work-up. No indications for hospitalization identified.      Carlisle Cater, PA-C 01/03/14 2311

## 2014-01-03 NOTE — ED Notes (Signed)
Pt reports RLQ pain which started Friday, [facility] and was instructed to come to the ED for possible appendicitis but did not f/u.  Pt reports vomiting as well with decreased appetite.  Pt reports he also had an elevated WBC.

## 2014-01-03 NOTE — ED Provider Notes (Signed)
Medical screening examination/treatment/procedure(s) were performed by non-physician practitioner and as supervising physician I was immediately available for consultation/collaboration.   EKG Interpretation None        Gwyneth SproutWhitney Malike Foglio, MD 01/03/14 2322

## 2014-01-03 NOTE — Discharge Instructions (Signed)
Please read and follow all provided instructions.  Your diagnoses today include:  1. Lower abdominal pain     Tests performed today include:  Blood counts and electrolytes - mildly high white blood cell count  Blood tests to check liver and kidney function - mild elevation in liver function  Blood tests to check pancreas function  Urine test to look for infection and pregnancy (in women)  CT scan - shows fatty liver, no explanation for your pain  Vital signs. See below for your results today.   Medications prescribed:   Oxycodone - narcotic pain medication  DO NOT drive or perform any activities that require you to be awake and alert because this medicine can make you drowsy.    Zofran (ondansetron) - for nausea and vomiting   Colace - stool softener  This medication can be found over-the-counter.   Continue colace for 2 weeks after your stools return to normal to prevent constipation.   Take any prescribed medications only as directed.  Home care instructions:   Follow any educational materials contained in this packet.  Follow-up instructions: Please follow-up with your primary care provider in the next 2 days for further evaluation of your symptoms.    Return instructions:  SEEK IMMEDIATE MEDICAL ATTENTION IF:  The pain does not go away or becomes severe   A temperature above 101F develops   Repeated vomiting occurs (multiple episodes)   The pain becomes localized to portions of the abdomen. The right side could possibly be appendicitis. In an adult, the left lower portion of the abdomen could be colitis or diverticulitis.   Blood is being passed in stools or vomit (bright red or black tarry stools)   You develop chest pain, difficulty breathing, dizziness or fainting, or become confused, poorly responsive, or inconsolable (young children)  If you have any other emergent concerns regarding your health  Additional Information: Abdominal (belly) pain  can be caused by many things. Your caregiver performed an examination and possibly ordered blood/urine tests and imaging (CT scan, x-rays, ultrasound). Many cases can be observed and treated at home after initial evaluation in the emergency department. Even though you are being discharged home, abdominal pain can be unpredictable. Therefore, you need a repeated exam if your pain does not resolve, returns, or worsens. Most patients with abdominal pain don't have to be admitted to the hospital or have surgery, but serious problems like appendicitis and gallbladder attacks can start out as nonspecific pain. Many abdominal conditions cannot be diagnosed in one visit, so follow-up evaluations are very important.  Your vital signs today were: BP 140/73   Pulse 98   Temp(Src) 98 F (36.7 C) (Oral)   Resp 18   SpO2 100% If your blood pressure (bp) was elevated above 135/85 this visit, please have this repeated by your doctor within one month. --------------

## 2014-01-16 ENCOUNTER — Telehealth: Payer: Self-pay | Admitting: Internal Medicine

## 2014-01-16 NOTE — Telephone Encounter (Signed)
Has gallstones and RUQ pain  Had a more intense pain today/tonight x 10 mins No other problems like fever or vomiting.  Suspect a more severe GB sx.  Advised to go to ED for intractable pain, persistent vomiting or fever.  Keep [facility] appt for tomorrow

## 2014-01-17 ENCOUNTER — Encounter (HOSPITAL_COMMUNITY): Payer: Self-pay | Admitting: Pharmacy Technician

## 2014-01-18 ENCOUNTER — Telehealth: Payer: Self-pay

## 2014-01-18 NOTE — Telephone Encounter (Signed)
Message copied by Donata DuffLEWIS, Airis Barbee L on Wed Jan 18, 2014  8:37 AM ------      Message from: Donata DuffLEWIS, Grazia Taffe L      Created: Wed Dec 21, 2013  8:51 AM       Pt to get labs in 4 weeks  ------

## 2014-01-18 NOTE — Telephone Encounter (Signed)
Pt has had labs.

## 2014-01-19 ENCOUNTER — Ambulatory Visit (INDEPENDENT_AMBULATORY_CARE_PROVIDER_SITE_OTHER): Payer: Self-pay | Admitting: General Surgery

## 2014-01-19 ENCOUNTER — Other Ambulatory Visit (HOSPITAL_COMMUNITY): Payer: Self-pay | Admitting: *Deleted

## 2014-01-19 NOTE — Pre-Procedure Instructions (Signed)
Shane PiliPaul D Apodaca Jr.  01/19/2014   Your procedure is scheduled on:  Tuesday, January 24, 2014 at 9:15 AM.   [facility] at 7:15 AM.   Call this number if you have problems the morning of surgery: [phone]   Remember:   Do not eat food or drink liquids after midnight Monday, 01/23/14.   Take these medicines the morning of surgery with A SIP OF WATER: gabapentin (NEURONTIN), pantoprazole (PROTONIX), acetaminophen (TYLENOL) - if needed, ondansetron (ZOFRAN ODT) - if needed.  Stop Ibuprofen and Vitamins as of today.   Do not wear jewelry.  Do not wear lotions, powders, or cologne. You may wear deodorant.  Men may shave face and neck.  Do not bring valuables to the hospital.  [facility] is not responsible                  for any belongings or valuables.               Contacts, dentures or bridgework may not be worn into surgery.  Leave suitcase in the car. After surgery it may be brought to your room.  For patients admitted to the hospital, discharge time is determined by your                treatment team.               Patients discharged the day of surgery will not be allowed to drive home.       Please read over the following fact sheets that you were given: Pain Booklet, Coughing and Deep Breathing and Surgical Site Infection Prevention

## 2014-01-20 ENCOUNTER — Encounter (HOSPITAL_COMMUNITY)
Admission: RE | Admit: 2014-01-20 | Discharge: 2014-01-20 | Disposition: A | Discharge status: Home / Self Care | Payer: 59 | Source: Ambulatory Visit | Attending: General Surgery | Admitting: General Surgery

## 2014-01-20 ENCOUNTER — Encounter (HOSPITAL_COMMUNITY): Payer: Self-pay

## 2014-01-20 DIAGNOSIS — Z79899 Other long term (current) drug therapy: Secondary | ICD-10-CM | POA: Insufficient documentation

## 2014-01-20 DIAGNOSIS — I10 Essential (primary) hypertension: Secondary | ICD-10-CM | POA: Insufficient documentation

## 2014-01-20 DIAGNOSIS — N289 Disorder of kidney and ureter, unspecified: Secondary | ICD-10-CM | POA: Insufficient documentation

## 2014-01-20 DIAGNOSIS — E119 Type 2 diabetes mellitus without complications: Secondary | ICD-10-CM | POA: Diagnosis not present

## 2014-01-20 DIAGNOSIS — I2510 Atherosclerotic heart disease of native coronary artery without angina pectoris: Secondary | ICD-10-CM | POA: Diagnosis not present

## 2014-01-20 DIAGNOSIS — Z01812 Encounter for preprocedural laboratory examination: Secondary | ICD-10-CM | POA: Diagnosis present

## 2014-01-20 HISTORY — DX: Complex regional pain syndrome I, unspecified: G90.50

## 2014-01-20 LAB — BASIC METABOLIC PANEL
Anion gap: 15 (ref 5–15)
BUN: 21 mg/dL (ref 6–23)
CHLORIDE: 100 meq/L (ref 96–112)
CO2: 24 mEq/L (ref 19–32)
Calcium: 9.5 mg/dL (ref 8.4–10.5)
Creatinine, Ser: 0.93 mg/dL (ref 0.50–1.35)
GFR calc Af Amer: >90 mL/min (ref >90)
GFR calc non Af Amer: >90 mL/min (ref >90)
Glucose, Bld: 109 mg/dL — ABNORMAL HIGH (ref 70–99)
POTASSIUM: 4.2 meq/L (ref 3.7–5.3)
Sodium: 139 mEq/L (ref 137–147)

## 2014-01-20 LAB — CBC
HCT: 44.3 % (ref 39.0–52.0)
HEMOGLOBIN: 15.5 g/dL (ref 13.0–17.0)
MCH: 31.1 pg (ref 26.0–34.0)
MCHC: 35 g/dL (ref 30.0–36.0)
MCV: 88.8 fL (ref 78.0–100.0)
PLATELETS: 224 10*3/uL (ref 150–400)
RBC: 4.99 MIL/uL (ref 4.22–5.81)
RDW: 12.5 % (ref 11.5–15.5)
WBC: 7.3 10*3/uL (ref 4.0–10.5)

## 2014-01-20 NOTE — Progress Notes (Signed)
Primary - dr. Bonita QuinYou No cardiologist No prior cardiac testing

## 2014-01-23 MED ORDER — CHLORHEXIDINE GLUCONATE 4 % EX LIQD
1.0000 "application " | Freq: Once | CUTANEOUS | Status: DC
  Filled 2014-01-23: qty 15

## 2014-01-23 MED ORDER — VANCOMYCIN HCL 10 G IV SOLR
1500.0000 mg | INTRAVENOUS | Status: AC
  Administered 2014-01-24: 1500 mg via INTRAVENOUS
  Filled 2014-01-23: qty 1500

## 2014-01-24 ENCOUNTER — Ambulatory Visit (HOSPITAL_COMMUNITY)
Admission: RE | Admit: 2014-01-24 | Discharge: 2014-01-24 | Disposition: A | Discharge status: Home / Self Care | Payer: 59 | Source: Ambulatory Visit | Attending: General Surgery | Admitting: General Surgery

## 2014-01-24 ENCOUNTER — Encounter (HOSPITAL_COMMUNITY)
Admission: RE | Disposition: A | Discharge status: Home / Self Care | Payer: Self-pay | Source: Ambulatory Visit | Attending: General Surgery

## 2014-01-24 ENCOUNTER — Ambulatory Visit (HOSPITAL_COMMUNITY): Payer: 59 | Admitting: Anesthesiology

## 2014-01-24 ENCOUNTER — Encounter (HOSPITAL_COMMUNITY): Payer: Self-pay | Admitting: Anesthesiology

## 2014-01-24 DIAGNOSIS — Z880 Allergy status to penicillin: Secondary | ICD-10-CM | POA: Insufficient documentation

## 2014-01-24 DIAGNOSIS — K8010 Calculus of gallbladder with chronic cholecystitis without obstruction: Secondary | ICD-10-CM | POA: Diagnosis not present

## 2014-01-24 DIAGNOSIS — K649 Unspecified hemorrhoids: Secondary | ICD-10-CM | POA: Insufficient documentation

## 2014-01-24 DIAGNOSIS — Z6833 Body mass index (BMI) 33.0-33.9, adult: Secondary | ICD-10-CM | POA: Insufficient documentation

## 2014-01-24 DIAGNOSIS — M549 Dorsalgia, unspecified: Secondary | ICD-10-CM | POA: Insufficient documentation

## 2014-01-24 DIAGNOSIS — Z8371 Family history of colonic polyps: Secondary | ICD-10-CM | POA: Insufficient documentation

## 2014-01-24 DIAGNOSIS — Z885 Allergy status to narcotic agent status: Secondary | ICD-10-CM | POA: Insufficient documentation

## 2014-01-24 DIAGNOSIS — Z888 Allergy status to other drugs, medicaments and biological substances status: Secondary | ICD-10-CM | POA: Insufficient documentation

## 2014-01-24 DIAGNOSIS — Z800 Family history of malignant neoplasm of digestive organs: Secondary | ICD-10-CM | POA: Insufficient documentation

## 2014-01-24 DIAGNOSIS — G6289 Other specified polyneuropathies: Secondary | ICD-10-CM | POA: Insufficient documentation

## 2014-01-24 DIAGNOSIS — F319 Bipolar disorder, unspecified: Secondary | ICD-10-CM | POA: Diagnosis not present

## 2014-01-24 DIAGNOSIS — F419 Anxiety disorder, unspecified: Secondary | ICD-10-CM | POA: Diagnosis not present

## 2014-01-24 DIAGNOSIS — Z720 Tobacco use: Secondary | ICD-10-CM | POA: Diagnosis not present

## 2014-01-24 DIAGNOSIS — Z823 Family history of stroke: Secondary | ICD-10-CM | POA: Diagnosis not present

## 2014-01-24 DIAGNOSIS — K8020 Calculus of gallbladder without cholecystitis without obstruction: Secondary | ICD-10-CM | POA: Diagnosis present

## 2014-01-24 DIAGNOSIS — Z886 Allergy status to analgesic agent status: Secondary | ICD-10-CM | POA: Diagnosis not present

## 2014-01-24 DIAGNOSIS — K219 Gastro-esophageal reflux disease without esophagitis: Secondary | ICD-10-CM | POA: Diagnosis not present

## 2014-01-24 HISTORY — PX: CHOLECYSTECTOMY: SHX55

## 2014-01-24 SURGERY — LAPAROSCOPIC CHOLECYSTECTOMY
Anesthesia: General

## 2014-01-24 MED ORDER — HYDROMORPHONE HCL 1 MG/ML IJ SOLN
INTRAMUSCULAR | Status: AC
  Filled 2014-01-24: qty 1

## 2014-01-24 MED ORDER — MIDAZOLAM HCL 2 MG/2ML IJ SOLN
0.5000 mg | Freq: Once | INTRAMUSCULAR | Status: AC | PRN
  Administered 2014-01-24: 1 mg via INTRAVENOUS

## 2014-01-24 MED ORDER — OXYCODONE HCL 5 MG/5ML PO SOLN: 5.0000 mg | Freq: Once | ORAL | Status: AC | PRN

## 2014-01-24 MED ORDER — PROPOFOL 10 MG/ML IV BOLUS
INTRAVENOUS | Status: DC | PRN
  Administered 2014-01-24: 200 mg via INTRAVENOUS

## 2014-01-24 MED ORDER — OXYCODONE HCL 5 MG PO TABS
ORAL_TABLET | ORAL | Status: DC
Start: 2014-01-24 — End: 2014-01-24
  Filled 2014-01-24: qty 1

## 2014-01-24 MED ORDER — BUPIVACAINE HCL 0.25 % IJ SOLN
INTRAMUSCULAR | Status: DC | PRN
Start: 2014-01-24 — End: 2014-01-24
  Administered 2014-01-24: 5 mL

## 2014-01-24 MED ORDER — MIDAZOLAM HCL 2 MG/2ML IJ SOLN
INTRAMUSCULAR | Status: AC
  Filled 2014-01-24: qty 2

## 2014-01-24 MED ORDER — SUCCINYLCHOLINE CHLORIDE 20 MG/ML IJ SOLN
INTRAMUSCULAR | Status: DC | PRN
  Administered 2014-01-24: 100 mg via INTRAVENOUS

## 2014-01-24 MED ORDER — MEPERIDINE HCL 25 MG/ML IJ SOLN: 6.2500 mg | INTRAMUSCULAR | Status: DC | PRN

## 2014-01-24 MED ORDER — FENTANYL CITRATE 0.05 MG/ML IJ SOLN
INTRAMUSCULAR | Status: DC | PRN
  Administered 2014-01-24: 250 ug via INTRAVENOUS

## 2014-01-24 MED ORDER — PROPOFOL 10 MG/ML IV BOLUS
INTRAVENOUS | Status: AC
  Filled 2014-01-24: qty 20

## 2014-01-24 MED ORDER — DEXAMETHASONE SODIUM PHOSPHATE 4 MG/ML IJ SOLN
INTRAMUSCULAR | Status: DC | PRN
  Administered 2014-01-24: 4 mg via INTRAVENOUS

## 2014-01-24 MED ORDER — OXYCODONE HCL 5 MG PO TABS: 5.0000 mg | ORAL_TABLET | ORAL | Status: DC | PRN

## 2014-01-24 MED ORDER — ONDANSETRON HCL 4 MG/2ML IJ SOLN
INTRAMUSCULAR | Status: AC
  Filled 2014-01-24: qty 2

## 2014-01-24 MED ORDER — FENTANYL CITRATE 0.05 MG/ML IJ SOLN
INTRAMUSCULAR | Status: AC
  Filled 2014-01-24: qty 5

## 2014-01-24 MED ORDER — PHENYLEPHRINE HCL 10 MG/ML IJ SOLN
INTRAMUSCULAR | Status: DC | PRN
  Administered 2014-01-24 (×2): 80 ug via INTRAVENOUS

## 2014-01-24 MED ORDER — LACTATED RINGERS IV SOLN
INTRAVENOUS | Status: DC
  Administered 2014-01-24: 08:00:00 via INTRAVENOUS

## 2014-01-24 MED ORDER — HYDROMORPHONE HCL 1 MG/ML IJ SOLN
0.2500 mg | INTRAMUSCULAR | Status: DC | PRN
  Administered 2014-01-24 (×2): 1 mg via INTRAVENOUS

## 2014-01-24 MED ORDER — SODIUM CHLORIDE 0.9 % IR SOLN
Status: DC | PRN
  Administered 2014-01-24: 1000 mL

## 2014-01-24 MED ORDER — OXYCODONE HCL 5 MG PO TABS
5.0000 mg | ORAL_TABLET | Freq: Once | ORAL | Status: AC | PRN
  Administered 2014-01-24: 5 mg via ORAL

## 2014-01-24 MED ORDER — PHENYLEPHRINE 40 MCG/ML (10ML) SYRINGE FOR IV PUSH (FOR BLOOD PRESSURE SUPPORT)
PREFILLED_SYRINGE | INTRAVENOUS | Status: AC
  Filled 2014-01-24: qty 10

## 2014-01-24 MED ORDER — PROMETHAZINE HCL 25 MG/ML IJ SOLN: 6.2500 mg | INTRAMUSCULAR | Status: DC | PRN

## 2014-01-24 MED ORDER — LIDOCAINE HCL (CARDIAC) 20 MG/ML IV SOLN
INTRAVENOUS | Status: DC | PRN
  Administered 2014-01-24: 50 mg via INTRAVENOUS

## 2014-01-24 MED ORDER — MIDAZOLAM HCL 5 MG/5ML IJ SOLN
INTRAMUSCULAR | Status: DC | PRN
  Administered 2014-01-24: 2 mg via INTRAVENOUS

## 2014-01-24 MED ORDER — ROCURONIUM BROMIDE 50 MG/5ML IV SOLN
INTRAVENOUS | Status: AC
  Filled 2014-01-24: qty 1

## 2014-01-24 MED ORDER — 0.9 % SODIUM CHLORIDE (POUR BTL) OPTIME
TOPICAL | Status: DC | PRN
  Administered 2014-01-24: 1000 mL

## 2014-01-24 MED ORDER — BUPIVACAINE-EPINEPHRINE (PF) 0.25% -1:200000 IJ SOLN
INTRAMUSCULAR | Status: AC
  Filled 2014-01-24: qty 30

## 2014-01-24 MED ORDER — OXYCODONE-ACETAMINOPHEN 5-325 MG PO TABS: 1.0000 | ORAL_TABLET | ORAL | Status: DC | PRN

## 2014-01-24 MED ORDER — LIDOCAINE HCL (CARDIAC) 20 MG/ML IV SOLN
INTRAVENOUS | Status: AC
  Filled 2014-01-24: qty 5

## 2014-01-24 MED ORDER — ONDANSETRON HCL 4 MG/2ML IJ SOLN
INTRAMUSCULAR | Status: DC | PRN
  Administered 2014-01-24: 4 mg via INTRAVENOUS

## 2014-01-24 SURGICAL SUPPLY — 45 items
BENZOIN TINCTURE PRP APPL 2/3 (GAUZE/BANDAGES/DRESSINGS) ×2 IMPLANT
CANISTER SUCTION 2500CC (MISCELLANEOUS) ×2 IMPLANT
CHLORAPREP W/TINT 26ML (MISCELLANEOUS) ×2 IMPLANT
CLIP LIGATING HEMO O LOK GREEN (MISCELLANEOUS) ×2 IMPLANT
COVER MAYO STAND STRL (DRAPES) IMPLANT
COVER SURGICAL LIGHT HANDLE (MISCELLANEOUS) ×2 IMPLANT
COVER TRANSDUCER ULTRASND (DRAPES) ×2 IMPLANT
DEVICE TROCAR PUNCTURE CLOSURE (ENDOMECHANICALS) ×2 IMPLANT
DRAPE C-ARM 42X72 X-RAY (DRAPES) IMPLANT
DRAPE LAPAROSCOPIC ABDOMINAL (DRAPES) ×2 IMPLANT
DRAPE UTILITY 15X26 W/TAPE STR (DRAPE) ×4 IMPLANT
ELECT REM PT RETURN 9FT ADLT (ELECTROSURGICAL) ×2
ELECTRODE REM PT RTRN 9FT ADLT (ELECTROSURGICAL) ×1 IMPLANT
GAUZE SPONGE 2X2 8PLY STRL LF (GAUZE/BANDAGES/DRESSINGS) ×1 IMPLANT
GLOVE BIO SURGEON STRL SZ 6.5 (GLOVE) ×2 IMPLANT
GLOVE BIO SURGEON STRL SZ7.5 (GLOVE) ×2 IMPLANT
GLOVE BIOGEL PI IND STRL 7.0 (GLOVE) ×2 IMPLANT
GLOVE BIOGEL PI INDICATOR 7.0 (GLOVE) ×2
GLOVE SURG SS PI 7.0 STRL IVOR (GLOVE) ×2 IMPLANT
GOWN STRL REUS W/ TWL LRG LVL3 (GOWN DISPOSABLE) ×2 IMPLANT
GOWN STRL REUS W/ TWL XL LVL3 (GOWN DISPOSABLE) ×1 IMPLANT
GOWN STRL REUS W/TWL LRG LVL3 (GOWN DISPOSABLE) ×2
GOWN STRL REUS W/TWL XL LVL3 (GOWN DISPOSABLE) ×1
IV CATH 14GX2 1/4 (CATHETERS) IMPLANT
KIT BASIN OR (CUSTOM PROCEDURE TRAY) ×2 IMPLANT
KIT ROOM TURNOVER OR (KITS) ×2 IMPLANT
NEEDLE INSUFFLATION 14GA 120MM (NEEDLE) ×2 IMPLANT
NS IRRIG 1000ML POUR BTL (IV SOLUTION) ×2 IMPLANT
PAD ARMBOARD 7.5X6 YLW CONV (MISCELLANEOUS) ×4 IMPLANT
POUCH SPECIMEN RETRIEVAL 10MM (ENDOMECHANICALS) IMPLANT
SCISSORS LAP 5X35 DISP (ENDOMECHANICALS) ×2 IMPLANT
SET CHOLANGIOGRAPHY FRANKLIN (SET/KITS/TRAYS/PACK) IMPLANT
SET IRRIG TUBING LAPAROSCOPIC (IRRIGATION / IRRIGATOR) ×2 IMPLANT
SLEEVE ENDOPATH XCEL 5M (ENDOMECHANICALS) ×2 IMPLANT
SPECIMEN JAR SMALL (MISCELLANEOUS) ×2 IMPLANT
SPONGE GAUZE 2X2 STER 10/PKG (GAUZE/BANDAGES/DRESSINGS) ×1
STRIP CLOSURE SKIN 1/2X4 (GAUZE/BANDAGES/DRESSINGS) ×2 IMPLANT
SUT MNCRL AB 3-0 PS2 18 (SUTURE) ×4 IMPLANT
TAPE CLOTH SURG 4X10 WHT LF (GAUZE/BANDAGES/DRESSINGS) ×2 IMPLANT
TOWEL OR 17X24 6PK STRL BLUE (TOWEL DISPOSABLE) ×2 IMPLANT
TOWEL OR 17X26 10 PK STRL BLUE (TOWEL DISPOSABLE) ×2 IMPLANT
TRAY LAPAROSCOPIC (CUSTOM PROCEDURE TRAY) ×2 IMPLANT
TROCAR XCEL NON-BLD 11X100MML (ENDOMECHANICALS) ×2 IMPLANT
TROCAR XCEL NON-BLD 5MMX100MML (ENDOMECHANICALS) ×2 IMPLANT
TUBING INSUFFLATION (TUBING) ×2 IMPLANT

## 2014-01-24 NOTE — Transfer of Care (Signed)
Immediate Anesthesia Transfer of Care Note  Patient: Shane Landryaul D Erazo Jr.  Procedure(s) Performed: Procedure(s): LAPAROSCOPIC CHOLECYSTECTOMY (N/A)  Patient Location: PACU  Anesthesia Type:General  Level of Consciousness: awake, alert  and oriented  Airway & Oxygen Therapy: Patient Spontanous Breathing and Patient connected to face mask oxygen  Post-op Assessment: Report given to PACU RN, Post -op Vital signs reviewed and stable and Patient moving all extremities X 4  Post vital signs: Reviewed and stable  Complications: No apparent anesthesia complications

## 2014-01-24 NOTE — Anesthesia Preprocedure Evaluation (Addendum)
Anesthesia Evaluation  Patient identified by MRN, date of birth, ID band Patient awake    Reviewed: Allergy & Precautions, H&P , NPO status , Patient's Chart, lab work & pertinent test results  History of Anesthesia Complications Negative for: history of anesthetic complications  Airway Mallampati: II  TM Distance: >3 FB Neck ROM: Full    Dental  (+) Edentulous Upper, Edentulous Lower   Pulmonary Current Smoker,  breath sounds clear to auscultation        Cardiovascular negative cardio ROS  Rhythm:Regular Rate:Normal     Neuro/Psych  Headaches, Anxiety Depression Bipolar Disorder Chronic leg pain/neuropathy    GI/Hepatic Neg liver ROS, PUD, GERD-  Medicated and Poorly Controlled,  Endo/Other  Morbid obesity  Renal/GU negative Renal ROS     Musculoskeletal   Abdominal (+) + obese,   Peds  Hematology   Anesthesia Other Findings   Reproductive/Obstetrics                           Anesthesia Physical Anesthesia Plan  ASA: III  Anesthesia Plan: General   Post-op Pain Management:    Induction: Intravenous  Airway Management Planned: Oral ETT  Additional Equipment:   Intra-op Plan:   Post-operative Plan: Extubation in OR  Informed Consent: I have reviewed the patients History and Physical, chart, labs and discussed the procedure including the risks, benefits and alternatives for the proposed anesthesia with the patient or authorized representative who has indicated his/her understanding and acceptance.     Plan Discussed with: CRNA and Surgeon  Anesthesia Plan Comments: (Plan routine monitors, GETA)        Anesthesia Quick Evaluation

## 2014-01-24 NOTE — Anesthesia Procedure Notes (Signed)
Procedure Name: Intubation Date/Time: 01/24/2014 8:47 AM Performed by: Quentin OreWALKER, Mataeo Ingwersen E Pre-anesthesia Checklist: Patient identified, Emergency Drugs available, Suction available, Patient being monitored and Timeout performed Patient Re-evaluated:Patient Re-evaluated prior to inductionOxygen Delivery Method: Circle system utilized Preoxygenation: Pre-oxygenation with 100% oxygen Intubation Type: IV induction, Rapid sequence and Cricoid Pressure applied Ventilation: Mask ventilation without difficulty Laryngoscope Size: Mac and 3 Grade View: Grade I Tube type: Oral Tube size: 7.0 mm Number of attempts: 1 Airway Equipment and Method: Stylet Placement Confirmation: ETT inserted through vocal cords under direct vision,  positive ETCO2 and breath sounds checked- equal and bilateral Secured at: 22 cm Tube secured with: Tape Dental Injury: Teeth and Oropharynx as per pre-operative assessment

## 2014-01-24 NOTE — H&P (Signed)
History of Present Illness Shane Cole(Shane Jiles MD; 01/17/2014 11:14 AM) Patient words: eval gallbladder.  The patient is a 33 year old male who presents for evaluation of gall stones. The patient is a 33 year old male who is referred by Dr. Christella HartiganJacobs for evaluation of symptomatic cholelithiasis. Patient states he has right upper quadrant pain for several years. He states to progressively get worse. He's had accompanied with nausea and vomiting. He states that at this point any food causes pain.  The patient has been to the ER multiple occasions secondary this pain. He's had ultrasound and CT scan which revealed gallstones however no signs of cholecystitis. LFTS WNL   Other Problems Shane Cole([name], CMA; 01/17/2014 10:46 AM) Back Pain Cholelithiasis Gastroesophageal Reflux Disease Hemorrhoids  Past Surgical History Shane Cole([name], CMA; 01/17/2014 10:46 AM) Oral Surgery  Diagnostic Studies History Shane Cole([name], CMA; 01/17/2014 10:46 AM) Colonoscopy 5-10 years ago  Allergies Shane Cole([name], CMA; 01/17/2014 10:45 AM) Aspirin *ANALGESICS - NonNarcotic* Penicillamine *ASSORTED CLASSES* Codeine Phosphate *ANALGESICS - OPIOID* Itching. Amitriptyline HCl *ANTIDEPRESSANTS*  Medication History Shane Cole([name], CMA; 01/17/2014 10:44 AM) Pantoprazole Sodium (20MG  Tablet DR, Oral daily) Active. Ranitidine HCl (150MG  Tablet, Oral daily) Active. Ibuprofen (800MG  Tablet, Oral daily) Active.  Social History Shane Cole([name], CMA; 01/17/2014 10:46 AM) Alcohol use Occasional alcohol use. Caffeine use Carbonated beverages, Coffee, Tea. No drug use Tobacco use Current every day smoker.  Family History Shane Cole([name], CMA; 01/17/2014 10:46 AM) Arthritis Father. Cancer Family Members In General. Cerebrovascular Accident Family Members In General. Colon Cancer Family Members In General. Colon Polyps Family Members In General. Hypertension Family Members In General, Father. Thyroid problems  Mother, Sister.  Review of Systems Shane Cole([name] CMA; 01/17/2014 10:46 AM) General Present- Appetite Loss, Chills, Fatigue, Night Sweats and Weight Gain. Not Present- Fever and Weight Loss. Skin Present- Change in Wart/Mole. Not Present- Dryness, Hives, Jaundice, New Lesions, Non-Healing Wounds, Rash and Ulcer. HEENT Present- Hoarseness, Nose Bleed, Seasonal Allergies and Sore Throat. Not Present- Earache, Hearing Loss, Oral Ulcers, Ringing in the Ears, Sinus Pain, Visual Disturbances, Wears glasses/contact lenses and Yellow Eyes. Respiratory Present- Snoring. Not Present- Bloody sputum, Chronic Cough, Difficulty Breathing and Wheezing. Breast Not Present- Breast Mass, Breast Pain, Nipple Discharge and Skin Changes. Cardiovascular Not Present- Chest Pain, Difficulty Breathing Lying Down, Leg Cramps, Palpitations, Rapid Heart Rate, Shortness of Breath and Swelling of Extremities. Gastrointestinal Present- Abdominal Pain, Bloating, Change in Bowel Habits, Difficulty Swallowing, Gets full quickly at meals, Hemorrhoids, Indigestion, Nausea and Vomiting. Not Present- Bloody Stool, Chronic diarrhea, Constipation, Excessive gas and Rectal Pain. Male Genitourinary Not Present- Blood in Urine, Change in Urinary Stream, Frequency, Impotence, Nocturia, Painful Urination, Urgency and Urine Leakage. Musculoskeletal Present- Joint Pain, Muscle Pain, Muscle Weakness and Swelling of Extremities. Not Present- Back Pain and Joint Stiffness. Neurological Present- Decreased Memory, Headaches and Weakness. Not Present- Fainting, Numbness, Seizures, Tingling, Tremor and Trouble walking. Psychiatric Present- Change in Sleep Pattern. Not Present- Anxiety, Bipolar, Depression, Fearful and Frequent crying. Endocrine Not Present- Cold Intolerance, Excessive Hunger, Hair Changes, Heat Intolerance, Hot flashes and New Diabetes. Hematology Not Present- Easy Bruising, Excessive bleeding, Gland problems, HIV and Persistent  Infections.   Vitals ([name] CMA; 01/17/2014 10:47 AM) 01/17/2014 10:46 AM Weight: 240 lb Height: 71in Body Surface Area: 2.34 m Body Mass Index: 33.47 kg/m Temp.: 97.51F(Temporal)  Pulse: 79 (Regular)  BP: 144/82 (Sitting, Left Arm, Standard)    Physical Exam Shane Cole(Johnte Portnoy MD; 01/17/2014 11:14 AM) General Mental Status-Alert. General Appearance-Consistent with stated age. Hydration-Well hydrated. Voice-Normal.  Head and Neck Head-normocephalic, atraumatic with no lesions or palpable masses.  Eye Eyeball - Bilateral-Extraocular movements intact. Sclera/Conjunctiva - Bilateral-No scleral icterus.  Chest and Lung Exam Chest and lung exam reveals -quiet, even and easy respiratory effort with no use of accessory muscles and on auscultation, normal breath sounds, no adventitious sounds and normal vocal resonance. Inspection Chest Wall - Normal. Back - normal.  Cardiovascular Cardiovascular examination reveals -on palpation PMI is normal in location and amplitude, no palpable S3 or S4. Normal cardiac borders., normal heart sounds, regular rate and rhythm with no murmurs, carotid auscultation reveals no bruits and normal pedal pulses bilaterally.  Abdomen Inspection Inspection of the abdomen reveals - No Hernias. Skin - Scar - no surgical scars. Palpation/Percussion Palpation and Percussion of the abdomen reveal - Soft, No Rebound tenderness, No Rigidity (guarding) and No hepatosplenomegaly. Tenderness - Right Upper Quadrant. Auscultation Auscultation of the abdomen reveals - Bowel sounds normal.  Neurologic Neurologic evaluation reveals -alert and oriented x 3 with no impairment of recent or remote memory. Mental Status-Normal.  Musculoskeletal Normal Exam - Left-Upper Extremity Strength Normal and Lower Extremity Strength Normal. Normal Exam - Right-Upper Extremity Strength Normal, Lower Extremity Weakness.    Assessment  & Plan Shane Cole(Jasier Calabretta MD; 01/17/2014 11:16 AM) SYMPTOMATIC CHOLELITHIASIS (574.20  K80.20) Impression: 33 year old male with cholelithiasis.  I had a long discussion with the patient that this is likely originating from his gallbladder and gallstones. He was elected proceed to the operating room for laparoscopic cholecystectomy. I discussed with him there is a possibility this could not relieve his pain management process ongoing.  Risks and benefits were discussed with the patient to generally include, but not limited to: infection, bleeding, possible need for post op ERCP, damage to the bile ducts, bile leak, and possible need for further surgery. Alternatives were offered and described. All questions were answered and the patient voiced understanding of the procedure and wishes to proceed at this point with a laparoscopic cholecystectomy Current Plans

## 2014-01-24 NOTE — Op Note (Signed)
01/24/2014  9:45 AM  PATIENT:  Shane LandryPaul D Amparan Jr.  33 y.o. male  PRE-OPERATIVE DIAGNOSIS:  Cholelithiasis  POST-OPERATIVE DIAGNOSIS:  Cholelithiasis  PROCEDURE:  Procedure(s): LAPAROSCOPIC CHOLECYSTECTOMY (N/A)  SURGEON:  Surgeon(s) and Role:    * Shane FillerArmando Sawsan Riggio, MD - Primary  ASSISTANTS: Corky CraftsBrittany Pjetraj, PA-S   ANESTHESIA:   local and general  EBL:   <5cc  BLOOD ADMINISTERED:none  DRAINS: none   LOCAL MEDICATIONS USED:  BUPIVICAINE   SPECIMEN:  Source of Specimen:  gallbladder  DISPOSITION OF SPECIMEN:  PATHOLOGY  COUNTS:  YES  TOURNIQUET:  * No tourniquets in log *  DICTATION: .Dragon Dictation Pre Operative Diagnosis: Biliary colic   Post Operative Diagnosis: same   Procedure: Lap chole with IOC   Surgeon: Dr. Axel FillerArmando Aamiyah Cole   Assistant: none   Anesthesia: GETA   EBL: <5cc   Complications: none   Counts: reported as correct x 2   Findings:Chronically inflammed gallbladder  Indications for procedure: Pt is a y/o M with RUQ pain and seen to have gallstones.   Details of the procedure: The patient was taken to the operating and placed in the supine position with bilateral SCDs in place. A time out was called and all facts were verified. A pneumoperitoneum was obtained via A Veress needle technique to a pressure of 14mm of mercury. A 5mm trochar was then placed in the right upper quadrant under visualization, and there were no injuries to any abdominal organs. A 11 mm port was then placed in the umbilical region after infiltrating with local anesthesia under direct visualization. A second epigastric port was placed under direct visualization. The gallbladder was identified and retracted, the peritoneum was then sharply dissected from the gallbladder and this dissection was carried down to Calot's triangle. The cystic duct was identified and stripped away circumferentially and seen going into the gallbladder 360, the critical angle was obtained.  2 clips  were placed proximally one distally and the cystic duct transected. The cystic artery was identified and 2 clips placed proximally and one distally and transected. We then proceeded to remove the gallbladder off the hepatic fossa with Bovie cautery. A retrieval bag was then placed in the abdomen and gallbladder placed in the bag. The hepatic fossa was then reexamined and hemostasis was achieved with Bovie cautery and was excellent at this portion of the case. The subhepatic fossa and perihepatic fossa was then irrigated until the effluent was clear. The 11 mm trocar fascia was reapproximated with the Endo Close #1 Vicryl x2. The pneumoperitoneum was evacuated and all trochars removed under direct visulalization. The skin was then closed with 4-0 Monocryl and the skin dressed with Steri-Strips, gauze, and tape. The patient was awaken from general anesthesia and taken to the recovery room in stable condition.    PLAN OF CARE: Discharge to home after PACU  PATIENT DISPOSITION:  PACU - hemodynamically stable.   Delay start of Pharmacological VTE agent (>24hrs) due to surgical blood loss or risk of bleeding: not applicable

## 2014-01-24 NOTE — Progress Notes (Signed)
Pt. Reports that he was on Clonidine for 6 months or so, about 2 yrs. Ago. For BP^.  Pt. Reports that he doesn't remember who or where it was prescribed.

## 2014-01-24 NOTE — Anesthesia Postprocedure Evaluation (Signed)
  Anesthesia Post-op Note  Patient: Shane Landryaul D Merkel Jr.  Procedure(s) Performed: Procedure(s): LAPAROSCOPIC CHOLECYSTECTOMY (N/A)  Patient Location: PACU  Anesthesia Type:General  Level of Consciousness: awake, alert , oriented and patient cooperative  Airway and Oxygen Therapy: Patient Spontanous Breathing  Post-op Pain: mild  Post-op Assessment: Post-op Vital signs reviewed, Patient's Cardiovascular Status Stable, Respiratory Function Stable, Patent Airway, No signs of Nausea or vomiting and Pain level controlled  Post-op Vital Signs: Reviewed and stable  Last Vitals:  Filed Vitals:   01/24/14 1215  BP:   Pulse: 110  Temp: 36.6 C  Resp:     Complications: No apparent anesthesia complications

## 2014-01-24 NOTE — Discharge Instructions (Signed)
[facility] ______[facility] °LAPAROSCOPIC SURGERY: POST OP INSTRUCTIONS °Always review your discharge instruction sheet given to you by the facility where your surgery was performed. °IF YOU HAVE DISABILITY OR FAMILY LEAVE FORMS, YOU MUST BRING THEM TO THE OFFICE FOR PROCESSING.   °DO NOT GIVE THEM TO YOUR DOCTOR. ° °1. A prescription for pain medication may be given to you upon discharge.  Take your pain medication as prescribed, if needed.  If narcotic pain medicine is not needed, then you may take acetaminophen (Tylenol) or ibuprofen (Advil) as needed. °2. Take your usually prescribed medications unless otherwise directed. °3. If you need a refill on your pain medication, please contact your pharmacy.  They will contact our office to request authorization. Prescriptions will not be filled after 5pm or on week-ends. °4. You should follow a light diet the first few days after arrival home, such as soup and crackers, etc.  Be sure to include lots of fluids daily. °5. Most patients will experience some swelling and bruising in the area of the incisions.  Ice packs will help.  Swelling and bruising can take several days to resolve.  °6. It is common to experience some constipation if taking pain medication after surgery.  Increasing fluid intake and taking a stool softener (such as Colace) will usually help or prevent this problem from occurring.  A mild laxative (Milk of Magnesia or Miralax) should be taken according to package instructions if there are no bowel movements after 48 hours. °7. Unless discharge instructions indicate otherwise, you may remove your bandages 24-48 hours after surgery, and you may shower at that time.  You may have steri-strips (small skin tapes) in place directly over the incision.  These strips should be left on the skin for 7-10 days.  If your surgeon used skin glue on the incision, you may shower in 24 hours.  The glue will flake off over the next 2-3 weeks.  Any sutures or  staples will be removed at the office during your follow-up visit. °8. ACTIVITIES:  You may resume regular (light) daily activities beginning the next day--such as daily self-care, walking, climbing stairs--gradually increasing activities as tolerated.  You may have sexual intercourse when it is comfortable.  Refrain from any heavy lifting or straining until approved by your doctor. °a. You may drive when you are no longer taking prescription pain medication, you can comfortably wear a seatbelt, and you can safely maneuver your car and apply brakes. °b. RETURN TO WORK:  __________________________________________________________ °9. You should see your doctor in the office for a follow-up appointment approximately 2-3 weeks after your surgery.  Make sure that you call for this appointment within a day or two after you arrive home to insure a convenient appointment time. °10. OTHER INSTRUCTIONS: __________________________________________________________________________________________________________________________ __________________________________________________________________________________________________________________________ °WHEN TO CALL YOUR DOCTOR: °1. Fever over 101.0 °2. Inability to urinate °3. Continued bleeding from incision. °4. Increased pain, redness, or drainage from the incision. °5. Increasing abdominal pain ° °The clinic staff is available to answer your questions during regular business hours.  Please don’t hesitate to call and ask to speak to one of the nurses for clinical concerns.  If you have a medical emergency, go to the nearest emergency room or call 911.  A surgeon from [facility] is always on call at the hospital. °[address], [address], [place], [place]  [address] ? [address] [address], , [place]   [address] °[phone] ? [phone] ? FAX [phone] °Web site:   [email or web address] ° °General Anesthesia, Adult, Care After  °Refer to this  sheet in the next few weeks. These instructions provide you with information on caring for yourself after your procedure. Your health care provider may also give you more specific instructions. Your treatment has been planned according to current medical practices, but problems sometimes occur. Call your health care provider if you have any problems or questions after your procedure.  °WHAT TO EXPECT AFTER THE PROCEDURE  °After the procedure, it is typical to experience:  °Sleepiness.  °Nausea and vomiting. °HOME CARE INSTRUCTIONS  °For the first 24 hours after general anesthesia:  °Have a responsible person with you.  °Do not drive a car. If you are alone, do not take public transportation.  °Do not drink alcohol.  °Do not take medicine that has not been prescribed by your health care provider.  °Do not sign important papers or make important decisions.  °You may resume a normal diet and activities as directed by your health care provider.  °Change bandages (dressings) as directed.  °If you have questions or problems that seem related to general anesthesia, call the hospital and ask for the anesthetist or anesthesiologist on call. °SEEK MEDICAL CARE IF:  °You have nausea and vomiting that continue the day after anesthesia.  °You develop a rash. °SEEK IMMEDIATE MEDICAL CARE IF:  °You have difficulty breathing.  °You have chest pain.  °You have any allergic problems. °Document Released: 06/16/2000 Document Revised: 11/10/2012 Document Reviewed: 09/23/2012  °Exit[employer]® Patient Information ©2014 [employer].  ° ° °

## 2014-01-25 ENCOUNTER — Encounter (HOSPITAL_COMMUNITY): Payer: Self-pay | Admitting: General Surgery

## 2014-02-13 ENCOUNTER — Encounter: Payer: Self-pay | Admitting: Gastroenterology

## 2014-02-13 ENCOUNTER — Ambulatory Visit (AMBULATORY_SURGERY_CENTER): Payer: 59 | Admitting: Gastroenterology

## 2014-02-13 VITALS — BP 115/69 | HR 85 | Temp 97.7°F | Resp 19 | Ht 69.0 in | Wt 240.0 lb

## 2014-02-13 DIAGNOSIS — K2990 Gastroduodenitis, unspecified, without bleeding: Secondary | ICD-10-CM

## 2014-02-13 DIAGNOSIS — R197 Diarrhea, unspecified: Secondary | ICD-10-CM

## 2014-02-13 DIAGNOSIS — K2970 Gastritis, unspecified, without bleeding: Secondary | ICD-10-CM

## 2014-02-13 DIAGNOSIS — K219 Gastro-esophageal reflux disease without esophagitis: Secondary | ICD-10-CM

## 2014-02-13 DIAGNOSIS — K2950 Unspecified chronic gastritis without bleeding: Secondary | ICD-10-CM

## 2014-02-13 MED ORDER — SODIUM CHLORIDE 0.9 % IV SOLN: 500.0000 mL | INTRAVENOUS | Status: DC

## 2014-02-13 MED ORDER — PANTOPRAZOLE SODIUM 40 MG PO TBEC: DELAYED_RELEASE_TABLET | ORAL | Status: DC

## 2014-02-13 NOTE — Progress Notes (Signed)
Called to room to assist during endoscopic procedure.  Patient ID and intended procedure confirmed with present staff. Received instructions for my participation in the procedure from the performing physician.  

## 2014-02-13 NOTE — Patient Instructions (Signed)
Discharge instructions given. Biopsies taken. Handout on diverticulosis and a high fiber diet. Resume previous medications. YOU HAD AN ENDOSCOPIC PROCEDURE TODAY AT THE [facility]: Refer to the procedure report that was given to you for any specific questions about what was found during the examination.  If the procedure report does not answer your questions, please call your gastroenterologist to clarify.  If you requested that your care partner not be given the details of your procedure findings, then the procedure report has been included in a sealed envelope for you to review at your convenience later.  YOU SHOULD EXPECT: Some feelings of bloating in the abdomen. Passage of more gas than usual.  Walking can help get rid of the air that was put into your GI tract during the procedure and reduce the bloating. If you had a lower endoscopy (such as a colonoscopy or flexible sigmoidoscopy) you may notice spotting of blood in your stool or on the toilet paper. If you underwent a bowel prep for your procedure, then you may not have a normal bowel movement for a few days.  DIET: Your first meal following the procedure should be a light meal and then it is ok to progress to your normal diet.  A half-sandwich or bowl of soup is an example of a good first meal.  Heavy or fried foods are harder to digest and may make you feel nauseous or bloated.  Likewise meals heavy in dairy and vegetables can cause extra gas to form and this can also increase the bloating.  Drink plenty of fluids but you should avoid alcoholic beverages for 24 hours.  ACTIVITY: Your care partner should take you home directly after the procedure.  You should plan to take it easy, moving slowly for the rest of the day.  You can resume normal activity the day after the procedure however you should NOT DRIVE or use heavy machinery for 24 hours (because of the sedation medicines used during the test).    SYMPTOMS TO REPORT  IMMEDIATELY: A gastroenterologist can be reached at any hour.  During normal business hours, 8:30 AM to 5:00 PM Monday through Friday, call [phone]) [phone].  After hours and on weekends, please call the GI answering service at [phone] who will take a message and have the physician on call contact you.   Following lower endoscopy (colonoscopy or flexible sigmoidoscopy):  Excessive amounts of blood in the stool  Significant tenderness or worsening of abdominal pains  Swelling of the abdomen that is new, acute  Fever of 100F or higher  Following upper endoscopy (EGD)  Vomiting of blood or coffee ground material  New chest pain or pain under the shoulder blades  Painful or persistently difficult swallowing  New shortness of breath  Fever of 100F or higher  Black, tarry-looking stools  FOLLOW UP: If any biopsies were taken you will be contacted by phone or by letter within the next 1-3 weeks.  Call your gastroenterologist if you have not heard about the biopsies in 3 weeks.  Our staff will call the home number listed on your records the next business day following your procedure to check on you and address any questions or concerns that you may have at that time regarding the information given to you following your procedure. This is a courtesy call and so if there is no answer at the home number and we have not heard from you through the emergency physician on call, we will assume that  you have returned to your regular daily activities without incident.  SIGNATURES/CONFIDENTIALITY: You and/or your care partner have signed paperwork which will be entered into your electronic medical record.  These signatures attest to the fact that that the information above on your After Visit Summary has been reviewed and is understood.  Full responsibility of the confidentiality of this discharge information lies with you and/or your care-partner.

## 2014-02-13 NOTE — Op Note (Signed)
[facility] [address] [place], [id number]   ENDOSCOPY PROCEDURE REPORT  PATIENT: Shane Cole, Shane Cole  MR#: [id number] BIRTHDATE: [birth year] , 33  yrs. old GENDER: male ENDOSCOPIST: Rachael Feeaniel P Donabelle Molden, MD PROCEDURE DATE:  02/13/2014 PROCEDURE:  EGD w/ biopsy ASA CLASS:     Class II INDICATIONS:  GERD, abdominal pain. MEDICATIONS: Monitored anesthesia care and Propofol 200 mg IV TOPICAL ANESTHETIC: none  DESCRIPTION OF PROCEDURE: After the risks benefits and alternatives of the procedure were thoroughly explained, informed consent was obtained.  The LB [id number] endoscope was introduced through the mouth and advanced to the second portion of the duodenum , Without limitations.  The instrument was slowly withdrawn as the mucosa was fully examined.    There was mild, non-specific distal gastritis.  Biopsies were taken and sent to pathology.  There was mild erosive esophagitis, reflux type.  The examination was otherwise normal.  Retroflexed views revealed no abnormalities.     The scope was then withdrawn from the patient and the procedure completed.  COMPLICATIONS: There were no immediate complications.  ENDOSCOPIC IMPRESSION: There was mild, non-specific distal gastritis.  Biopsies were taken and sent to pathology.  There was mild erosive esophagitis, reflux type.  The examination was otherwise normal  RECOMMENDATIONS: Await final pathology.   eSigned:  Rachael Feeaniel P Harrell Niehoff, MD 02/13/2014 2:34 PM

## 2014-02-13 NOTE — Op Note (Signed)
[facility] [address] [place], [id number]   COLONOSCOPY PROCEDURE REPORT  PATIENT: [name], Shane D  MR#: [id number] BIRTHDATE: 09/22/1980 , 33  yrs. old GENDER: male ENDOSCOPIST: Rachael Feeaniel P Teresina Bugaj, MD REFERRED XB:JYNWGNBY:Robert Artist PaisYoo, DO PROCEDURE DATE:  02/13/2014 PROCEDURE:   Colonoscopy with biopsy First Screening Colonoscopy - Avg.  risk and is 50 yrs.  old or older - No.  Prior Negative Screening - Now for repeat screening. N/A  History of Adenoma - Now for follow-up colonoscopy & has been > or = to 3 yrs.  N/A  Polyps Removed Today? No.  Recommend repeat exam, <10 yrs? No. ASA CLASS:   Class II INDICATIONS:diarrhea, abdominal pain. MEDICATIONS: Monitored anesthesia care and Propofol 300 mg IV  DESCRIPTION OF PROCEDURE:   After the risks benefits and alternatives of the procedure were thoroughly explained, informed consent was obtained.  The digital rectal exam revealed no abnormalities of the rectum.   The LB [id number]  endoscope was introduced through the anus and advanced to the terminal ileum which was intubated for a short distance. No adverse events experienced.   The quality of the prep was excellent.  The instrument was then slowly withdrawn as the colon was fully examined.    COLON FINDINGS: The terminal ileum was normal.  There were a few small diverticulum in the left colon.  The examination was otherwise normal.  The colon was randomly biopsied and sent to pathology.  Retroflexed views revealed no abnormalities. The time to cecum=3 minutes 19 seconds.  Withdrawal time=5 minutes 21 seconds.  The scope was withdrawn and the procedure completed. COMPLICATIONS: There were no immediate complications.  ENDOSCOPIC IMPRESSION: The terminal ileum was normal.  There were a few small diverticulum in the left colon.  The examination was otherwise normal.  The colon was randomly biopsied and sent to pathology  RECOMMENDATIONS: Await final  pathology results.  eSigned:  Rachael Feeaniel P Reilyn Nelson, MD 02/13/2014 2:21 PM

## 2014-02-13 NOTE — Progress Notes (Signed)
Procedure ends, to recovery, report given and VSS. 

## 2014-02-14 ENCOUNTER — Telehealth: Payer: Self-pay

## 2014-02-14 NOTE — Telephone Encounter (Signed)
  Follow up Call-  Call back number 02/13/2014  Post procedure Call Back phone  # [phone]  Permission to leave phone message Yes     Patient questions:  Do you have a fever, pain , or abdominal swelling? No. Pain Score  0 *  Have you tolerated food without any problems? Yes.    Have you been able to return to your normal activities? Yes.    Do you have any questions about your discharge instructions: Diet   No. Medications  No. Follow up visit  No.  Do you have questions or concerns about your Care? No.  Actions: * If pain score is 4 or above: No action needed, pain <4.   No problems per the pt. maw

## 2014-02-21 ENCOUNTER — Encounter: Payer: Self-pay | Admitting: Gastroenterology

## 2014-03-11 ENCOUNTER — Ambulatory Visit (INDEPENDENT_AMBULATORY_CARE_PROVIDER_SITE_OTHER): Payer: 59 | Admitting: Physician Assistant

## 2014-03-11 VITALS — BP 138/85 | HR 101 | Temp 98.7°F | Resp 18 | Wt 240.0 lb

## 2014-03-11 DIAGNOSIS — S39012A Strain of muscle, fascia and tendon of lower back, initial encounter: Secondary | ICD-10-CM

## 2014-03-11 MED ORDER — MELOXICAM 15 MG PO TABS: 15.0000 mg | ORAL_TABLET | Freq: Every day | ORAL | Status: DC

## 2014-03-11 MED ORDER — METHOCARBAMOL 500 MG PO TABS: 500.0000 mg | ORAL_TABLET | Freq: Three times a day (TID) | ORAL | Status: DC

## 2014-03-11 NOTE — Progress Notes (Signed)
Subjective:    Patient ID: Shane Cole., male    DOB: 08/31/1980, 33 y.o.   MRN: [id number]  HPI  This is a 33 year old male with PMH chronic pain and bipolar disorder who is presenting with right sided back pain x 2 weeks. He reports a twisting injury when catching his 34 year old daughter as she was jumping off a bed 2 weeks ago. He describes the pain as constant and soreness. He reports 2 days ago he began to have shooting pain to the bottom of his right buttock. He has never had pain like this before. He has never had a prior injury to his back although he reports he does have DJD. He denies weakness, paresthesias, problems with bowel/bladder. He has tried ibuprofen and tylenol without relief. He wears lidoderm patches for his chronic pain which has helped some.  Review of Systems  Constitutional: Negative for fever and chills.  Gastrointestinal: Negative for nausea, vomiting, abdominal pain, diarrhea and constipation.  Genitourinary: Negative for difficulty urinating.  Musculoskeletal: Positive for back pain.  Skin: Negative for rash.  Neurological: Negative for weakness and numbness.   Patient Active Problem List   Diagnosis Date Noted  . Chronic diarrhea 03/29/2012  . Bipolar disorder 03/29/2012   Prior to Admission medications   Medication Sig Start Date End Date Taking? Authorizing Provider  gabapentin (NEURONTIN) 600 MG tablet Take 600 mg by mouth 3 (three) times daily.   Yes Historical Provider, MD  ibuprofen (ADVIL,MOTRIN) 800 MG tablet Take 800 mg by mouth every 8 (eight) hours as needed for headache or moderate pain (leg pain).    Yes Historical Provider, MD  lidocaine (LIDODERM) 5 % Place 1-2 patches onto the skin daily. Remove & Discard patch within 12 hours or as directed by MD   Yes Historical Provider, MD  Multiple Vitamin (MULTIVITAMIN WITH MINERALS) TABS tablet Take 1 tablet by mouth daily.   Yes Historical Provider, MD  nortriptyline (PAMELOR) 75 MG capsule  Take 75 mg by mouth at bedtime.   Yes Historical Provider, MD  ondansetron (ZOFRAN ODT) 4 MG disintegrating tablet Take 1 tablet (4 mg total) by mouth every 8 (eight) hours as needed for nausea or vomiting. 01/03/14  Yes Renne CriglerJoshua Geiple, PA-C  pantoprazole (PROTONIX) 40 MG tablet Take 30 minutes before breakfast and before supper. 02/13/14  Yes Rachael Feeaniel P Jacobs, MD  ranitidine (ZANTAC) 150 MG tablet Take 1 tablet (150 mg total) by mouth at bedtime. 12/30/13  Yes Thao P Le, DO                        Allergies  Allergen Reactions  . Aspirin Other (See Comments)    Tinnitus, dizzy, short of breath  . Codeine Itching    Reaction to tylenol #3  . Penicillins Anaphylaxis  . Amitriptyline Rash   Patient's social and family history were reviewed.     Objective:   Physical Exam  Constitutional: He is oriented to person, place, and time. He appears well-developed and well-nourished. No distress.  HENT:  Head: Normocephalic and atraumatic.  Right Ear: Hearing normal.  Left Ear: Hearing normal.  Nose: Nose normal.  Eyes: Conjunctivae and lids are normal. Right eye exhibits no discharge. Left eye exhibits no discharge. No scleral icterus.  Cardiovascular: Normal rate, regular rhythm, normal heart sounds, intact distal pulses and normal pulses.   No murmur heard. Pulmonary/Chest: Effort normal and breath sounds normal. No respiratory distress. He  has no wheezes. He has no rhonchi. He has no rales.  Abdominal: Soft. Normal appearance. There is no tenderness. There is no rebound, no guarding and no CVA tenderness.  Musculoskeletal:       Lumbar back: He exhibits decreased range of motion and tenderness (right paraspinal). He exhibits no bony tenderness.       Arms: Straight leg raise negative  Neurological: He is alert and oriented to person, place, and time. He has normal strength. No sensory deficit.  Reflex Scores:      Patellar reflexes are 2+ on the right side and 2+ on the left side.       Achilles reflexes are 2+ on the right side and 2+ on the left side. Skin: Skin is warm, dry and intact. No lesion and no rash noted.  Psychiatric: He has a normal mood and affect. His speech is normal and behavior is normal. Thought content normal.      Assessment & Plan:  1. Lumbar strain, initial encounter Pt likely has lumbar strain. He states he has tried flexeril before and was too sedating for him. He will take robaxin TID. Pt has tried mobic before for pains and worked well and no allergic reaction to it. He will take this daily and return in 10-14 days if symptoms fail to improve.  - methocarbamol (ROBAXIN) 500 MG tablet; Take 1 tablet (500 mg total) by mouth 3 (three) times daily.  Dispense: 40 tablet; Refill: 0 - meloxicam (MOBIC) 15 MG tablet; Take 1 tablet (15 mg total) by mouth daily.  Dispense: 20 tablet; Refill: 0   Roswell MinersNicole V. Dyke BrackettBush, PA-C, MHS [facility]  03/12/2014

## 2014-03-11 NOTE — Patient Instructions (Signed)
Take mobic once a day. Do not take any other NSAIDs with this (NO ibuprofen/motrin/advil) You may take the muscle relaxer three times a day for pain and spasm. Use heating pad on back for relief. Return in 10-14 days if your symptoms fail to improve.

## 2014-03-12 NOTE — Progress Notes (Signed)
The patient was discussed with me and I agree with the diagnosis and treatment plan.  

## 2014-03-25 ENCOUNTER — Encounter (HOSPITAL_COMMUNITY): Payer: Self-pay | Admitting: Emergency Medicine

## 2014-03-25 ENCOUNTER — Emergency Department (HOSPITAL_COMMUNITY)
Admission: EM | Admit: 2014-03-25 | Discharge: 2014-03-25 | Disposition: A | Discharge status: Home / Self Care | Payer: 59 | Source: Home / Self Care | Attending: Family Medicine | Admitting: Family Medicine

## 2014-03-25 DIAGNOSIS — G8929 Other chronic pain: Secondary | ICD-10-CM

## 2014-03-25 DIAGNOSIS — M79672 Pain in left foot: Secondary | ICD-10-CM

## 2014-03-25 MED ORDER — OXYCODONE-ACETAMINOPHEN 5-325 MG PO TABS: 1.0000 | ORAL_TABLET | Freq: Three times a day (TID) | ORAL | Status: DC | PRN

## 2014-03-25 NOTE — Discharge Instructions (Signed)
Wear your leg boot and soak foot at bedtime and see dr Farris Has if further problems, we will not refill pain medication at [facility].

## 2014-03-25 NOTE — ED Notes (Signed)
C/o chronic left leg and foot pain.  Hx of crush injury.  No new injury.  No relief with otc pain meds.

## 2014-03-25 NOTE — ED Provider Notes (Signed)
CSN: [id number]     Arrival date & time 03/25/14  1514 History   First MD Initiated Contact with Patient 03/25/14 1610     Chief Complaint  Patient presents with  . Leg Pain   (Consider location/radiation/quality/duration/timing/severity/associated sxs/prior Treatment) Patient is a 34 y.o. male presenting with leg pain. The history is provided by the patient.  Leg Pain Location:  Foot Injury: no   Foot location:  L foot Pain details:    Quality:  Burning   Progression:  Worsening (pt with rsd from crush injury to left foot, longstanding care with ortho and pain mngment, reports more active recently  causing pain, ) Chronicity:  Chronic Dislocation: no   Prior injury to area:  Yes Associated symptoms: decreased ROM, numbness, stiffness, swelling and tingling     Past Medical History  Diagnosis Date  . Nerve pain   . Crush injury lower leg     Left lower leg  . Depression   . Migraine   . Bipolar disorder   . Allergy   . Anxiety   . Gastric ulcer   . GERD (gastroesophageal reflux disease)     will awaken him in night  . RSD (reflex sympathetic dystrophy)    Past Surgical History  Procedure Laterality Date  . Mass excision Left 01/25/2013    Procedure: EXCISION MASS DORSAL ASPECT LEFT LONG FINGER DISTAL INTERPHALANGEAL JOINT;  Surgeon: Wyn Forster., MD;  Location: [facility];  Service: Orthopedics;  Laterality: Left;  Left long   . Mouth surgery    . Hand surgery    . Colonoscopy    . Cholecystectomy N/A 01/24/2014    Procedure: LAPAROSCOPIC CHOLECYSTECTOMY;  Surgeon: Axel Filler, MD;  [facility];  Service: General;  Laterality: N/A;   Family History  Problem Relation Age of Onset  . Hyperlipidemia Father   . Hypertension Father   . Anxiety disorder Father   . Drug abuse Father   . Cancer Paternal Grandfather     lung, colon  . Colon cancer Paternal Grandfather   . Lung cancer Paternal Grandfather   . Stroke    . Heart disease    .  Diabetes Paternal Grandmother   . Cancer Paternal Grandmother   . Cancer Maternal Grandmother   . Cancer Maternal Grandfather   . Lung cancer Maternal Grandfather   . Depression Paternal Aunt   . Anxiety disorder Paternal Aunt   . Drug abuse Paternal Uncle    History  Substance Use Topics  . Smoking status: Current Every Day Smoker -- 0.50 packs/day for 20 years    Types: Cigarettes  . Smokeless tobacco: Never Used  . Alcohol Use: 3.6 oz/week    6 Cans of beer per week     Comment: occasional    Review of Systems  Musculoskeletal: Positive for joint swelling, gait problem and stiffness.    Allergies  Aspirin; Codeine; Penicillins; and Amitriptyline  Home Medications   Prior to Admission medications   Medication Sig Start Date End Date Taking? Authorizing Provider  gabapentin (NEURONTIN) 600 MG tablet Take 600 mg by mouth 3 (three) times daily.    Historical Provider, MD  ibuprofen (ADVIL,MOTRIN) 800 MG tablet Take 800 mg by mouth every 8 (eight) hours as needed for headache or moderate pain (leg pain).     Historical Provider, MD  lidocaine (LIDODERM) 5 % Place 1-2 patches onto the skin daily. Remove & Discard patch within 12 hours or as directed by  MD    Historical Provider, MD  meloxicam (MOBIC) 15 MG tablet Take 1 tablet (15 mg total) by mouth daily. 03/11/14   Dorna Leitz, PA-C  methocarbamol (ROBAXIN) 500 MG tablet Take 1 tablet (500 mg total) by mouth 3 (three) times daily. 03/11/14   Dorna Leitz, PA-C  Multiple Vitamin (MULTIVITAMIN WITH MINERALS) TABS tablet Take 1 tablet by mouth daily.    Historical Provider, MD  nortriptyline (PAMELOR) 75 MG capsule Take 75 mg by mouth at bedtime.    Historical Provider, MD  ondansetron (ZOFRAN ODT) 4 MG disintegrating tablet Take 1 tablet (4 mg total) by mouth every 8 (eight) hours as needed for nausea or vomiting. 01/03/14   Renne Crigler, PA-C  oxyCODONE-acetaminophen (PERCOCET/ROXICET) 5-325 MG per tablet Take 1 tablet by  mouth every 8 (eight) hours as needed for moderate pain or severe pain. 03/25/14   Linna Hoff, MD  pantoprazole (PROTONIX) 40 MG tablet Take 30 minutes before breakfast and before supper. 02/13/14   Rachael Fee, MD  ranitidine (ZANTAC) 150 MG tablet Take 1 tablet (150 mg total) by mouth at bedtime. 12/30/13   Thao P Le, DO   BP 137/89 mmHg  Pulse 111  Temp(Src) 98.4 F (36.9 C) (Oral)  Resp 16  SpO2 97% Physical Exam  Constitutional: He is oriented to person, place, and time. He appears well-developed and well-nourished.  Musculoskeletal: He exhibits tenderness.  Old scar and injury fx evident to left foot. , no infection or erythema.  Neurological: He is alert and oriented to person, place, and time.  Skin: Skin is warm and dry.  Nursing note and vitals reviewed.   ED Course  Procedures (including critical care time) Labs Review Labs Reviewed - No data to display  Imaging Review No results found.   MDM   1. Chronic foot pain, left        Linna Hoff, MD 03/25/14 [id number]

## 2014-05-09 ENCOUNTER — Encounter: Payer: Self-pay | Admitting: Physical Medicine & Rehabilitation

## 2014-05-11 ENCOUNTER — Telehealth: Payer: Self-pay

## 2014-05-11 ENCOUNTER — Ambulatory Visit (INDEPENDENT_AMBULATORY_CARE_PROVIDER_SITE_OTHER): Payer: 59

## 2014-05-11 ENCOUNTER — Ambulatory Visit (INDEPENDENT_AMBULATORY_CARE_PROVIDER_SITE_OTHER): Payer: 59 | Admitting: Emergency Medicine

## 2014-05-11 VITALS — BP 120/80 | HR 102 | Temp 98.0°F | Resp 16 | Ht 70.0 in | Wt 241.0 lb

## 2014-05-11 DIAGNOSIS — R05 Cough: Secondary | ICD-10-CM

## 2014-05-11 DIAGNOSIS — R059 Cough, unspecified: Secondary | ICD-10-CM

## 2014-05-11 DIAGNOSIS — F17200 Nicotine dependence, unspecified, uncomplicated: Secondary | ICD-10-CM | POA: Insufficient documentation

## 2014-05-11 DIAGNOSIS — J209 Acute bronchitis, unspecified: Secondary | ICD-10-CM

## 2014-05-11 DIAGNOSIS — Z720 Tobacco use: Secondary | ICD-10-CM

## 2014-05-11 IMAGING — CR DG CHEST 2V
4 series · 4 of 4 positions shown · non-contrast
Comparison: None.

CLINICAL DATA: Cough.

EXAM:
CHEST  2 VIEW

[PA (1 of 2)]
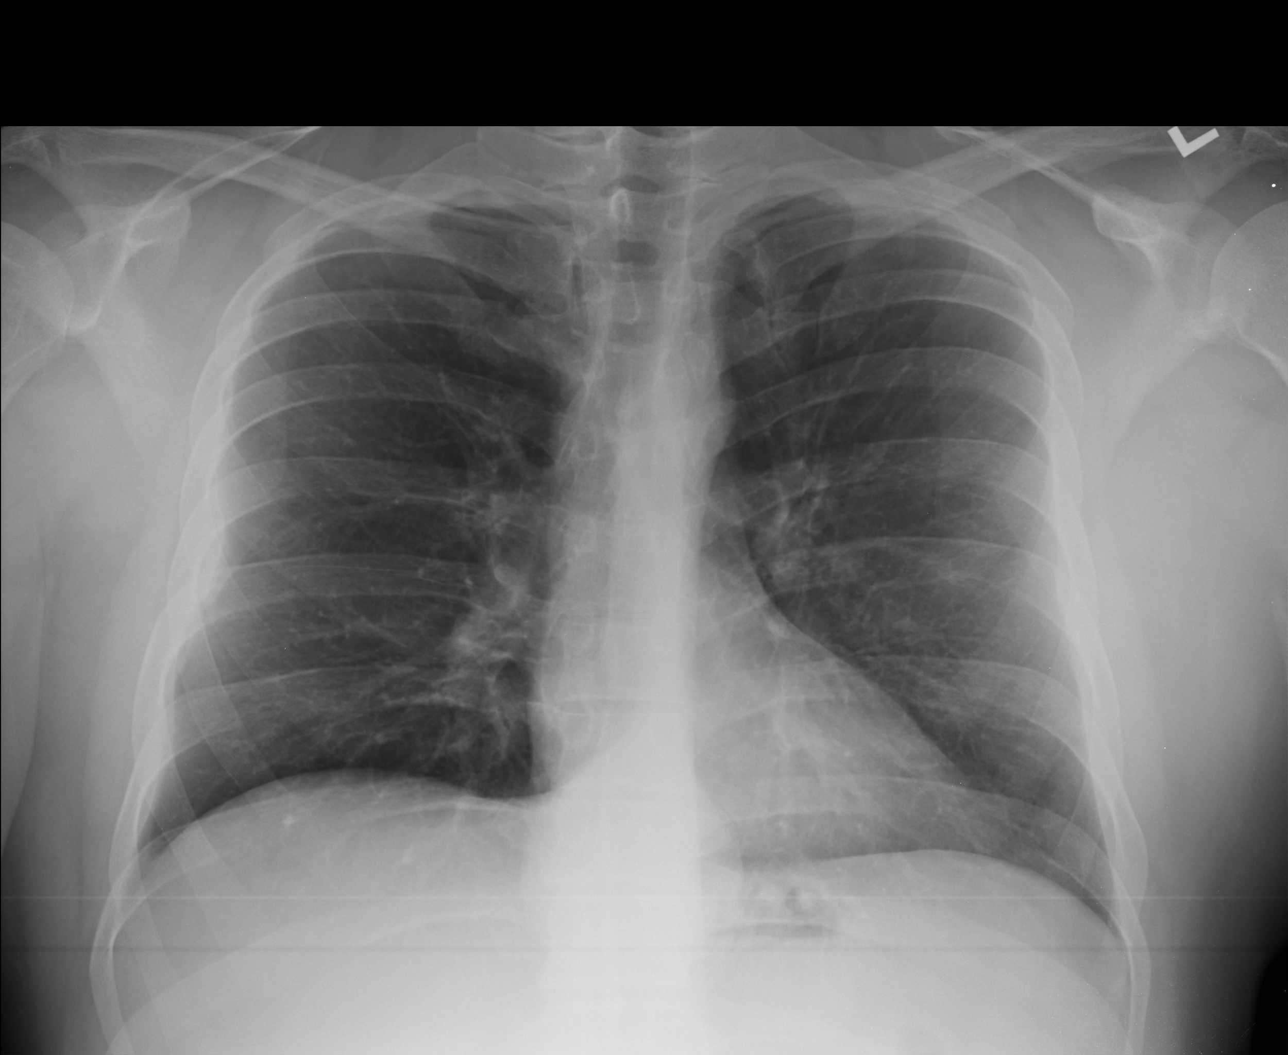

[lateral (1 of 2)]
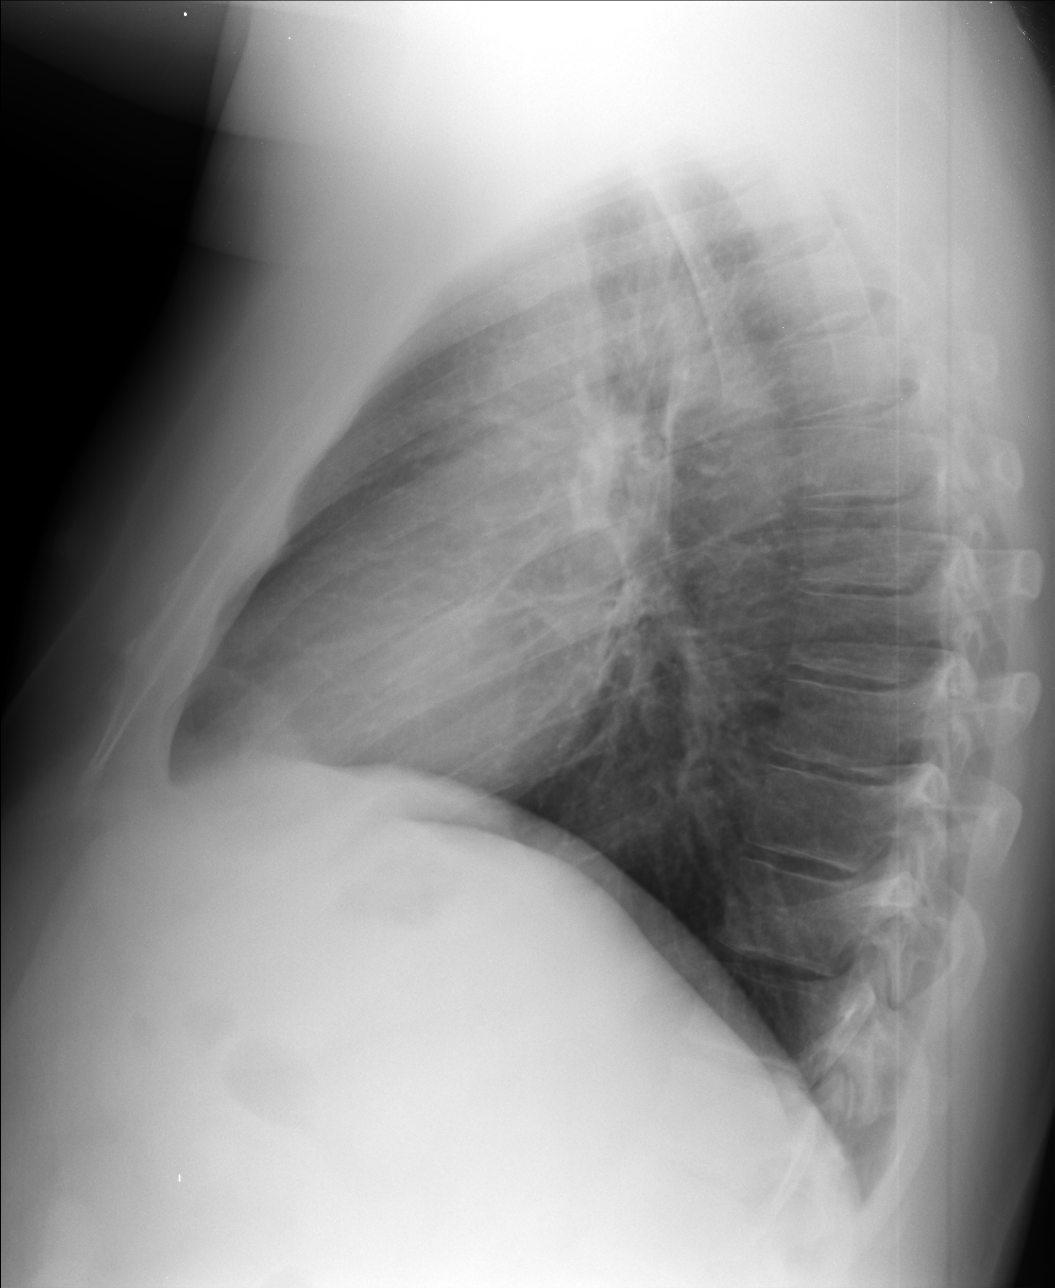

[PA (2 of 2)]
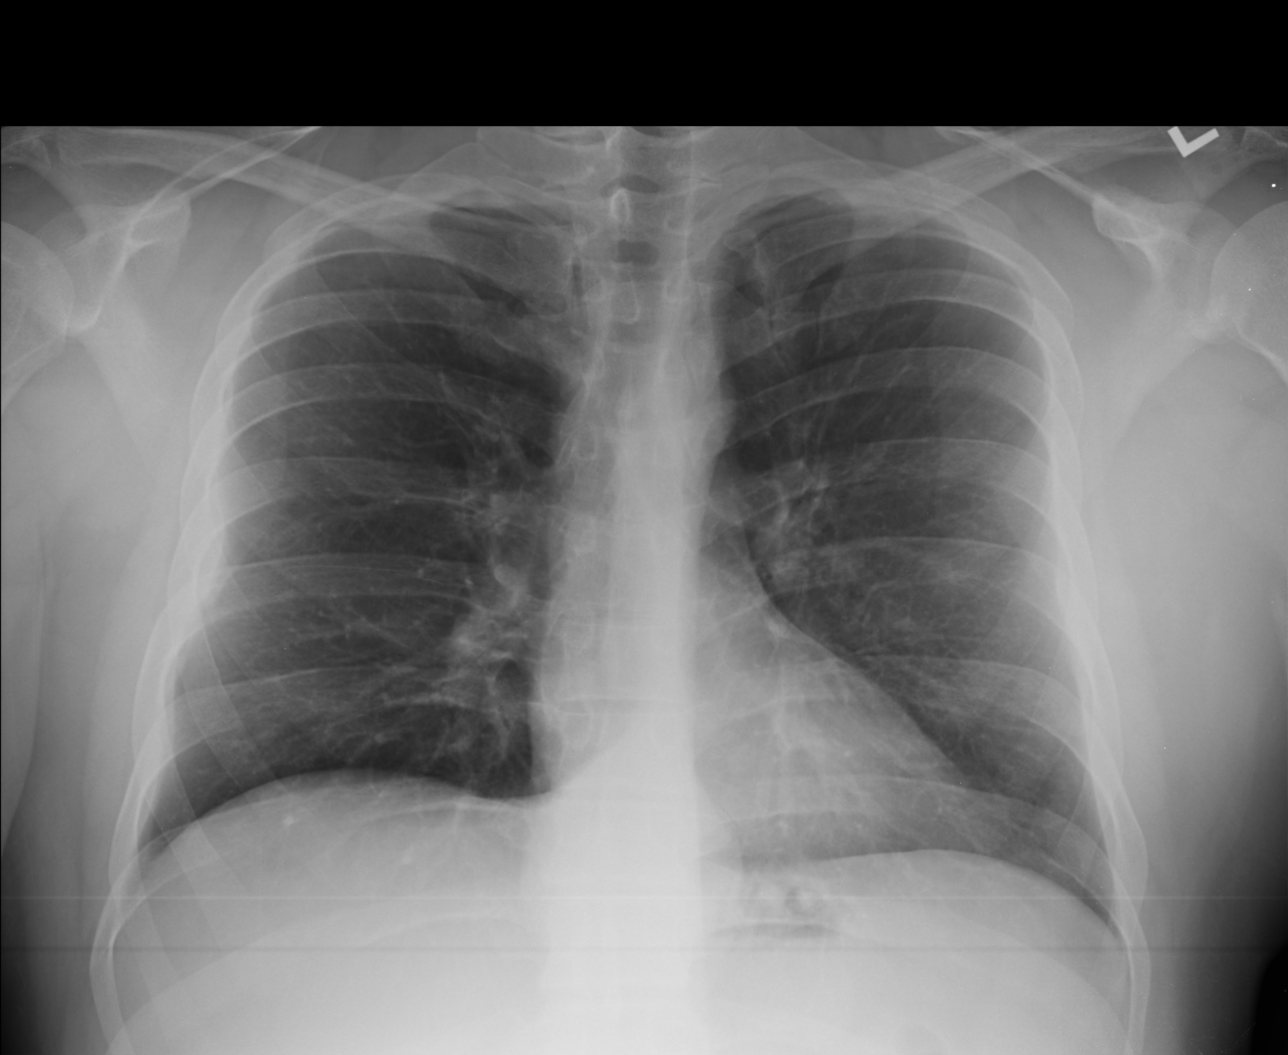

[lateral (2 of 2)]
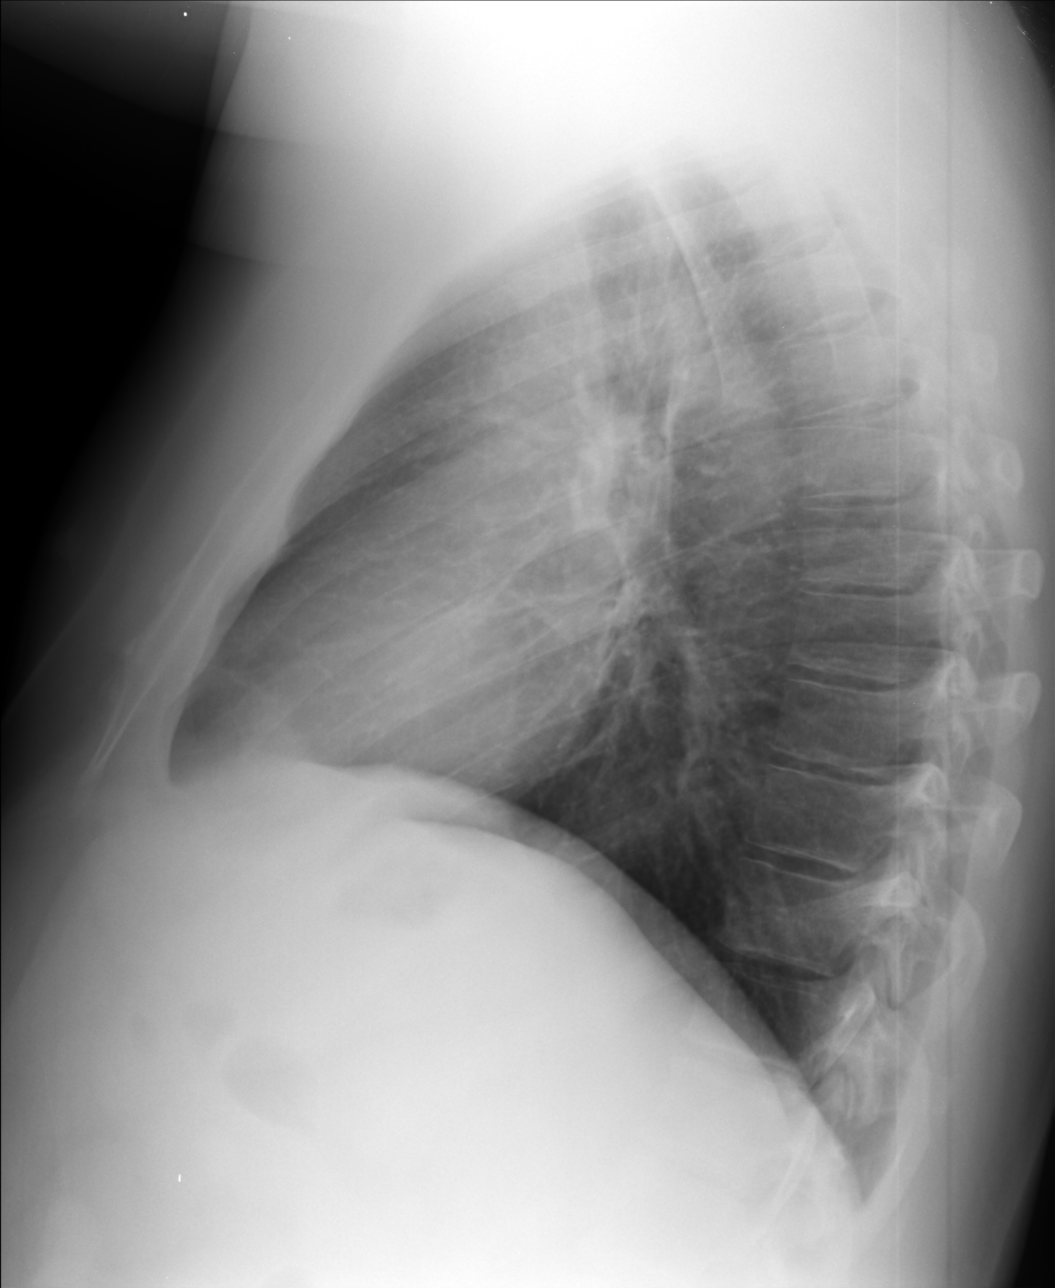

[4 of 4 positions shown; findings below may reference images not displayed]

FINDINGS: Mediastinum and hilar structures normal. Heart size normal. Lungs
are clear. No pleural effusion or pneumothorax. No acute bony
abnormality.
IMPRESSION: 1. No acute cardiopulmonary disease.
2. Small sliding hiatal hernia.

## 2014-05-11 MED ORDER — AZITHROMYCIN 250 MG PO TABS
ORAL_TABLET | ORAL | Status: DC
Start: 2014-05-11 — End: 2014-05-26

## 2014-05-11 MED ORDER — HYDROCOD POLST-CHLORPHEN POLST 10-8 MG/5ML PO LQCR: 5.0000 mL | Freq: Two times a day (BID) | ORAL | Status: DC | PRN

## 2014-05-11 MED ORDER — ALBUTEROL SULFATE HFA 108 (90 BASE) MCG/ACT IN AERS: 2.0000 | INHALATION_SPRAY | RESPIRATORY_TRACT | Status: DC | PRN

## 2014-05-11 NOTE — Patient Instructions (Signed)
Metered Dose Inhaler (No Spacer Used) Inhaled medicines are the basis of treatment for asthma and other breathing problems. Inhaled medicine can only be effective if used properly. Good technique assures that the medicine reaches the lungs. Metered dose inhalers (MDIs) are used to deliver a variety of inhaled medicines. These include quick relief or rescue medicines (such as bronchodilators) and controller medicines (such as corticosteroids). The medicine is delivered by pushing down on a metal canister to release a set amount of spray. If you are using different kinds of inhalers, use your quick relief medicine to open the airways 10-15 minutes before using a steroid, if instructed to do so by your health care provider. If you are unsure which inhalers to use and the order of using them, ask your health care provider, nurse, or respiratory therapist. HOW TO USE THE INHALER 1. Remove the cap from the inhaler. 2. If you are using the inhaler for the first time, you will need to prime it. Shake the inhaler for 5 seconds and release four puffs into the air, away from your face. Ask your health care provider or pharmacist if you have questions about priming your inhaler. 3. Shake the inhaler for 5 seconds before each breath in (inhalation). 4. Position the inhaler so that the top of the canister faces up. 5. Put your index finger on the top of the medicine canister. Your thumb supports the bottom of the inhaler. 6. Open your mouth. 7. Either place the inhaler between your teeth and place your lips tightly around the mouthpiece, or hold the inhaler 1-2 inches away from your open mouth. If you are unsure of which technique to use, ask your health care provider. 8. Breathe out (exhale) normally and as completely as possible. 9. Press the canister down with the index finger to release the medicine. 10. At the same time as the canister is pressed, inhale deeply and slowly until your lungs are completely filled.  This should take 4-6 seconds. Keep your tongue down. 11. Hold the medicine in your lungs for 5-10 seconds (10 seconds is best). This helps the medicine get into the small airways of your lungs. 12. Breathe out slowly, through pursed lips. Whistling is an example of pursed lips. 13. Wait at least 1 minute between puffs. Continue with the above steps until you have taken the number of puffs your health care provider has ordered. Do not use the inhaler more than your health care provider directs you to. 14. Replace the cap on the inhaler. 15. Follow the directions from your health care provider or the inhaler insert for cleaning the inhaler. If you are using a steroid inhaler, after your last puff, rinse your mouth with water, gargle, and spit out the water. Do not swallow the water. AVOID:  Inhaling before or after starting the spray of medicine. It takes practice to coordinate your breathing with triggering the spray.  Inhaling through the nose (rather than the mouth) when triggering the spray. HOW TO DETERMINE IF YOUR INHALER IS FULL OR NEARLY EMPTY You cannot know when an inhaler is empty by shaking it. Some inhalers are now being made with dose counters. Ask your health care provider for a prescription that has a dose counter if you feel you need that extra help. If your inhaler does not have a counter, ask your health care provider to help you determine the date you need to refill your inhaler. Write the refill date on a calendar or your inhaler canister. Refill   your inhaler 7-10 days before it runs out. Be sure to keep an adequate supply of medicine. This includes making sure it has not expired, and making sure you have a spare inhaler. SEEK MEDICAL CARE IF:  Symptoms are only partially relieved with your inhaler.  You are having trouble using your inhaler.  You experience an increase in phlegm. SEEK IMMEDIATE MEDICAL CARE IF:  You feel little or no relief with your inhalers. You are still  wheezing and feeling shortness of breath, tightness in your chest, or both.  You have dizziness, headaches, or a fast heart rate.  You have chills, fever, or night sweats.  There is a noticeable increase in phlegm production, or there is blood in the phlegm. MAKE SURE YOU:  Understand these instructions.  Will watch your condition.  Will get help right away if you are not doing well or get worse. Document Released: 01/05/2007 Document Revised: 07/25/2013 Document Reviewed: 08/26/2012 ExitCare Patient Information 2015 [employer]. This information is not intended to replace advice given to you by your health care provider. Make sure you discuss any questions you have with your health care provider. Acute Bronchitis Bronchitis is inflammation of the airways that extend from the windpipe into the lungs (bronchi). The inflammation often causes mucus to develop. This leads to a cough, which is the most common symptom of bronchitis.  In acute bronchitis, the condition usually develops suddenly and goes away over time, usually in a couple weeks. Smoking, allergies, and asthma can make bronchitis worse. Repeated episodes of bronchitis may cause further lung problems.  CAUSES Acute bronchitis is most often caused by the same virus that causes a cold. The virus can spread from person to person (contagious) through coughing, sneezing, and touching contaminated objects. SIGNS AND SYMPTOMS  16. Cough.  17. Fever.  18. Coughing up mucus.  19. Body aches.  20. Chest congestion.  21. Chills.  22. Shortness of breath.  23. Sore throat.  DIAGNOSIS  Acute bronchitis is usually diagnosed through a physical exam. Your health care provider will also ask you questions about your medical history. Tests, such as chest X-rays, are sometimes done to rule out other conditions.  TREATMENT  Acute bronchitis usually goes away in a couple weeks. Oftentimes, no medical treatment is necessary. Medicines  are sometimes given for relief of fever or cough. Antibiotic medicines are usually not needed but may be prescribed in certain situations. In some cases, an inhaler may be recommended to help reduce shortness of breath and control the cough. A cool mist vaporizer may also be used to help thin bronchial secretions and make it easier to clear the chest.  HOME CARE INSTRUCTIONS  Get plenty of rest.   Drink enough fluids to keep your urine clear or pale yellow (unless you have a medical condition that requires fluid restriction). Increasing fluids may help thin your respiratory secretions (sputum) and reduce chest congestion, and it will prevent dehydration.   Take medicines only as directed by your health care provider.  If you were prescribed an antibiotic medicine, finish it all even if you start to feel better.  Avoid smoking and secondhand smoke. Exposure to cigarette smoke or irritating chemicals will make bronchitis worse. If you are a smoker, consider using nicotine gum or skin patches to help control withdrawal symptoms. Quitting smoking will help your lungs heal faster.   Reduce the chances of another bout of acute bronchitis by washing your hands frequently, avoiding people with cold symptoms, and trying   not to touch your hands to your mouth, nose, or eyes.   Keep all follow-up visits as directed by your health care provider.  SEEK MEDICAL CARE IF: Your symptoms do not improve after 1 week of treatment.  SEEK IMMEDIATE MEDICAL CARE IF:  You develop an increased fever or chills.   You have chest pain.   You have severe shortness of breath.  You have bloody sputum.   You develop dehydration.  You faint or repeatedly feel like you are going to pass out.  You develop repeated vomiting.  You develop a severe headache. MAKE SURE YOU:   Understand these instructions.  Will watch your condition.  Will get help right away if you are not doing well or get  worse. Document Released: 04/17/2004 Document Revised: 07/25/2013 Document Reviewed: 08/31/2012 ExitCare Patient Information 2015 [employer]. This information is not intended to replace advice given to you by your health care provider. Make sure you discuss any questions you have with your health care provider.  

## 2014-05-11 NOTE — Progress Notes (Signed)
[facility] [address], [place] [address] [phone]  Date:  05/11/2014   Name:  Shane Landryaul D Keep Jr.   DOB:  08/19/1980   MRN:  [id number]  PCP:  Thomos Lemonsobert Yoo, DO    Chief Complaint: Chest Pain; Shortness of Breath; and Cough   History of Present Illness:  Shane Landryaul D Brunetti Jr. is a 34 y.o. very pleasant male patient who presents with the following:  Ill with cough occasionally productive of bloody sputum.  Other wise non productive for past month Some exertional shortness of breath.   No fever or chills No nasal congestion or drainage.  Says is fatigued.   Eating well.  No nausea or vomiting. No improvement with over the counter medications or other home remedies.  Denies other complaint or health concern today.   Patient Active Problem List   Diagnosis Date Noted  . Tobacco use disorder 05/11/2014  . Chronic diarrhea 03/29/2012  . Bipolar disorder 03/29/2012    Past Medical History  Diagnosis Date  . Nerve pain   . Crush injury lower leg     Left lower leg  . Depression   . Migraine   . Bipolar disorder   . Allergy   . Anxiety   . Gastric ulcer   . GERD (gastroesophageal reflux disease)     will awaken him in night  . RSD (reflex sympathetic dystrophy)     Past Surgical History  Procedure Laterality Date  . Mass excision Left 01/25/2013    Procedure: EXCISION MASS DORSAL ASPECT LEFT LONG FINGER DISTAL INTERPHALANGEAL JOINT;  Surgeon: Wyn Forsterobert V Sypher Jr., MD;  Location: [facility];  Service: Orthopedics;  Laterality: Left;  Left long   . Mouth surgery    . Hand surgery    . Colonoscopy    . Cholecystectomy N/A 01/24/2014    Procedure: LAPAROSCOPIC CHOLECYSTECTOMY;  Surgeon: Axel FillerArmando Ramirez, MD;  [facility];  Service: General;  Laterality: N/A;    History  Substance Use Topics  . Smoking status: Current Every Day Smoker -- 0.50 packs/day for 20 years    Types: Cigarettes  . Smokeless tobacco: Never Used  .  Alcohol Use: 3.6 oz/week    6 Cans of beer per week     Comment: occasional    Family History  Problem Relation Age of Onset  . Hyperlipidemia Father   . Hypertension Father   . Anxiety disorder Father   . Drug abuse Father   . Cancer Paternal Grandfather     lung, colon  . Colon cancer Paternal Grandfather   . Lung cancer Paternal Grandfather   . Stroke    . Heart disease    . Diabetes Paternal Grandmother   . Cancer Paternal Grandmother   . Cancer Maternal Grandmother   . Cancer Maternal Grandfather   . Lung cancer Maternal Grandfather   . Depression Paternal Aunt   . Anxiety disorder Paternal Aunt   . Drug abuse Paternal Uncle     Allergies  Allergen Reactions  . Aspirin Other (See Comments)    Tinnitus, dizzy, short of breath  . Codeine Itching    Reaction to tylenol #3  . Penicillins Anaphylaxis  . Amitriptyline Rash    Medication list has been reviewed and updated.  Current Outpatient Prescriptions on File Prior to Visit  Medication Sig Dispense Refill  . gabapentin (NEURONTIN) 600 MG tablet Take 600 mg by mouth 3 (three) times daily.    .Marland Kitchen  ibuprofen (ADVIL,MOTRIN) 800 MG tablet Take 800 mg by mouth every 8 (eight) hours as needed for headache or moderate pain (leg pain).     Marland Kitchen lidocaine (LIDODERM) 5 % Place 1-2 patches onto the skin daily. Remove & Discard patch within 12 hours or as directed by MD    . Multiple Vitamin (MULTIVITAMIN WITH MINERALS) TABS tablet Take 1 tablet by mouth daily.    . nortriptyline (PAMELOR) 75 MG capsule Take 75 mg by mouth at bedtime.    . pantoprazole (PROTONIX) 40 MG tablet Take 30 minutes before breakfast and before supper. 30 tablet 11  . ranitidine (ZANTAC) 150 MG tablet Take 1 tablet (150 mg total) by mouth at bedtime. 30 tablet 2  . meloxicam (MOBIC) 15 MG tablet Take 1 tablet (15 mg total) by mouth daily. (Patient not taking: Reported on 05/11/2014) 20 tablet 0  . methocarbamol (ROBAXIN) 500 MG tablet Take 1 tablet (500 mg  total) by mouth 3 (three) times daily. (Patient not taking: Reported on 05/11/2014) 40 tablet 0  . oxyCODONE-acetaminophen (PERCOCET/ROXICET) 5-325 MG per tablet Take 1 tablet by mouth every 8 (eight) hours as needed for moderate pain or severe pain. (Patient not taking: Reported on 05/11/2014) 6 tablet 0   No current facility-administered medications on file prior to visit.    Review of Systems:  As per HPI, otherwise negative.    Physical Examination: Filed Vitals:   05/11/14 1146  BP: 120/80  Pulse: 102  Temp: 98 F (36.7 C)  Resp: 16   Filed Vitals:   05/11/14 1146  Height:  (1.778 m)  Weight: 241 lb (109.317 kg)   Body mass index is 34.58 kg/(m^2). Ideal Body Weight: Weight in (lb) to have BMI = 25: 173.9  GEN: WDWN, NAD, Non-toxic, A & O x 3 HEENT: Atraumatic, Normocephalic. Neck supple. No masses, No LAD. Ears and Nose: No external deformity. CV: RRR, No M/G/R. No JVD. No thrill. No extra heart sounds. PULM: CTA B, no wheezes, crackles, rhonchi. No retractions. No resp. distress. No accessory muscle use. ABD: S, NT, ND, +BS. No rebound. No HSM. EXTR: No c/c/e NEURO Normal gait.  PSYCH: Normally interactive. Conversant. Not depressed or anxious appearing.  Calm demeanor.    Assessment and Plan: Bronchitis zpak tussionex Albuterol  Signed,  Phillips Odor, MD   [facility] reading (PRIMARY) by  Dr. Dareen Piano.  negative.

## 2014-05-11 NOTE — Telephone Encounter (Signed)
[facility] called. Pt is there expecting an inhaler and no Rx was sent for one. Spoke w/Dr Dareen PianoAnderson who gave VO to give Rx verbally on phone. Gave pharm info as ordered and entered in EPIC for record.

## 2014-05-26 ENCOUNTER — Other Ambulatory Visit: Payer: Self-pay | Admitting: Physical Medicine & Rehabilitation

## 2014-05-26 ENCOUNTER — Encounter: Payer: 59 | Attending: Physical Medicine & Rehabilitation

## 2014-05-26 ENCOUNTER — Ambulatory Visit (HOSPITAL_BASED_OUTPATIENT_CLINIC_OR_DEPARTMENT_OTHER): Payer: 59 | Admitting: Physical Medicine & Rehabilitation

## 2014-05-26 ENCOUNTER — Encounter: Payer: Self-pay | Admitting: Physical Medicine & Rehabilitation

## 2014-05-26 VITALS — BP 150/84 | HR 121 | Resp 14

## 2014-05-26 DIAGNOSIS — G894 Chronic pain syndrome: Secondary | ICD-10-CM | POA: Diagnosis present

## 2014-05-26 DIAGNOSIS — Z79899 Other long term (current) drug therapy: Secondary | ICD-10-CM | POA: Insufficient documentation

## 2014-05-26 DIAGNOSIS — M89062 Algoneurodystrophy, left lower leg: Secondary | ICD-10-CM | POA: Diagnosis not present

## 2014-05-26 DIAGNOSIS — Z5181 Encounter for therapeutic drug level monitoring: Secondary | ICD-10-CM

## 2014-05-26 MED ORDER — LIDOCAINE 5 % EX OINT: 1.0000 "application " | TOPICAL_OINTMENT | CUTANEOUS | Status: DC | PRN

## 2014-05-26 NOTE — Progress Notes (Signed)
Subjective:    Patient ID: Shane LandryPaul D Cole Jr., male    DOB: 08/26/1980, 34 y.o.   MRN: [id number] Chief complaint is left foot pain Secondary complaint right knee pain HPI 34 year old male who had a work-related injury in 2005. A chunk of [place] fell on his left foot, sustained multiple distal foot fractures at the medical tarsals as well as phalanges Was immobilized in a boot for about 6 months.No operative treatment Patient remembers being diagnosed by Dr. Farris HasKramer with reflex sympathetic dystrophy around 2007 [facility] Dr. Roderic [facility] and received sympathetic blocks Patient also tried on various pain medications such as oxycodone, fentanyl, OxyContin Also tried gabapentin, nortriptyline and Lidoderm Has good days and bad days, on his bad days he walks with a cane. He states he has gained about 90 pounds since his injury Patient has 2 types of pain he has his RSD type pain where he has swelling and tenderness over the foot which hurts when he walks and then he also has a sharper pain which she describes as bone pain affecting his second and third toes Has also had subsequent fractures in the toes which have healed except for one persistent pack sure seen at the base of the second toe phalange  Patient states he has been prescribed oxycodone by his orthopedist. Had a prescription from September 2015, 40 tablets which has lasted him until recently. States that he does have certain days where he has a lot of pain and would like to have some medicine to help with this.   Pain Inventory Average Pain 3 Pain Right Now 8 My pain is intermittent, sharp, burning, stabbing and aching  In the last 24 hours, has pain interfered with the following? General activity 7 Relation with others 6 Enjoyment of life 9 What TIME of day is your pain at its worst? daytime, night Sleep (in general) Poor  Pain is worse with: walking, bending, standing and some activites Pain  improves with: rest, heat/ice, pacing activities, medication and injections Relief from Meds: 7  Mobility walk without assistance walk with assistance use a walker how many minutes can you walk? varies ability to climb steps?  yes do you drive?  yes Do you have any goals in this area?  yes  Function not employed: date last employed . I need assistance with the following:  household duties and shopping Do you have any goals in this area?  yes  Neuro/Psych weakness tingling trouble walking spasms depression anxiety  Prior Studies new visit  Physicians involved in your care new visit   Family History  Problem Relation Age of Onset  . Hyperlipidemia Father   . Hypertension Father   . Anxiety disorder Father   . Drug abuse Father   . Cancer Paternal Grandfather     lung, colon  . Colon cancer Paternal Grandfather   . Lung cancer Paternal Grandfather   . Stroke    . Heart disease    . Diabetes Paternal Grandmother   . Cancer Paternal Grandmother   . Cancer Maternal Grandmother   . Cancer Maternal Grandfather   . Lung cancer Maternal Grandfather   . Depression Paternal Aunt   . Anxiety disorder Paternal Aunt   . Drug abuse Paternal Uncle    History   Social History  . Marital Status: Married    Spouse Name: N/A  . Number of Children: 1  . Years of Education: 10th grade   Occupational History  . Disabled  Previously worked Web designer   Social History Main Topics  . Smoking status: Current Every Day Smoker -- 0.50 packs/day for 20 years    Types: Cigarettes  . Smokeless tobacco: Never Used  . Alcohol Use: 3.6 oz/week    6 Cans of beer per week     Comment: occasional  . Drug Use: No  . Sexual Activity:    Partners: Female   Other Topics Concern  . None   Social History Narrative   05/24/12  Dsean was born and grew up in [place], [place]. He has 2 sisters. He reports that his childhood was "lousy." He completed the 10th  grade. He has been married for 5 years, and is currently separated for 3 weeks. He has a daughter who is 81-1/2 years old. He has been unemployed for 2 years, and is disabled due to an on-the-job work accident. He is currently living with his aunt and cousin. He reports that his hobbies are sports and dogs. He reports that he is spiritual, but not religious. He states that his aunt and his father are his social support system. He denies any current legal problems, but got a DUI in 2007. 05/24/12 [facility]         Past Surgical History  Procedure Laterality Date  . Mass excision Left 01/25/2013    Procedure: EXCISION MASS DORSAL ASPECT LEFT LONG FINGER DISTAL INTERPHALANGEAL JOINT;  Surgeon: Wyn Forster., MD;  Location: [facility];  Service: Orthopedics;  Laterality: Left;  Left long   . Mouth surgery    . Hand surgery    . Colonoscopy    . Cholecystectomy N/A 01/24/2014    Procedure: LAPAROSCOPIC CHOLECYSTECTOMY;  Surgeon: Axel Filler, MD;  Location: [facility] OR;  Service: General;  Laterality: N/A;   Past Medical History  Diagnosis Date  . Nerve pain   . Crush injury lower leg     Left lower leg  . Depression   . Migraine   . Bipolar disorder   . Allergy   . Anxiety   . Gastric ulcer   . GERD (gastroesophageal reflux disease)     will awaken him in night  . RSD (reflex sympathetic dystrophy)    There were no vitals taken for this visit.  Opioid Risk Score: 2 Fall Risk Score: Moderate Fall Risk (6-13 points)  Review of Systems  Constitutional:       Weight gain   Gastrointestinal: Positive for nausea and diarrhea.  Musculoskeletal: Positive for gait problem.  Neurological: Positive for weakness.       Tingling Spasms   Psychiatric/Behavioral: Positive for dysphoric mood. The patient is nervous/anxious.   All other systems reviewed and are negative.      Objective:   Physical Exam  Constitutional: He is oriented to person, place, and time. He appears  well-developed and well-nourished.  HENT:  Head: Normocephalic and atraumatic.  Eyes: Conjunctivae and EOM are normal. Pupils are equal, round, and reactive to light.  Musculoskeletal:  Left foot has scarring dorsal distal aspect of the foot There is decreased sensation in the first and second dorsal webspace of the left foot, mildly diminished sensation on the plantar surface of left foot Pain with passive flexion and extension of the left second toe phalanx There is coolness of the left foot compared to right foot as well as increased erythema, no skin changes or nail changes of the scarring from the crush injury Right knee has no  evidence of effusion no tenderness to palpation over the medial or lateral joint line note tenderness over the patella no tenderness over the hamstring tendons negative Apley's grind test  Neurological: He is alert and oriented to person, place, and time. He has normal strength.  Motor strength is 5/5 in the right hip flexor and extensor ankle dorsal flexor 5/5 left hip flexor knee extensor 3 minus ankle dorsiflexor  Psychiatric: He has a normal mood and affect.  Nursing note and vitals reviewed.         Assessment & Plan:  1. Reflex sympathetic dystrophy left lower extremity, CRPS-type 1 At this point this is chronic  He would like to have an ununited bone fragment at the base of the second left toe phalanx removed. This would likely cause a flareup of his complication pain syndrome but this may be mitigated by 1-2 preoperative and 1-2 postoperative lumbar sympathetic blocks  We discussed other treatments in terms of his pain, we'll trial him on lidocaine cream 5% applied 3 times per day as needed to the dorsum of the left foot Other potential treatments may include posterior tibial nerve block under electrical stimulation or ultrasound guidance  We'll check urine drug screen, he uses oxycodone sparingly could write for 30 tablets but this should last him  about 2 months based on his reported usage pattern  Should he undergo foot surgery, podiatrist would prescribe postoperative pain medicine, I'll therefore see the patient back in 5 weeks which should be postoperative depending on surgery scheduling

## 2014-05-26 NOTE — Patient Instructions (Signed)
If you undergo foot surgery I would recommend the following, preoperative one or 2 lumbar sympathetic blocks Post operative one or 2 lumbar parasympathetic blocks  In terms of anesthesia, would recommend regional anesthesia at the ankle or knee level

## 2014-05-27 LAB — PMP ALCOHOL METABOLITE (ETG)

## 2014-05-31 LAB — OPIATES/OPIOIDS (LC/MS-MS)
Codeine Urine: NEGATIVE ng/mL (ref <50)
HYDROCODONE: NEGATIVE ng/mL (ref <50)
HYDROMORPHONE: NEGATIVE ng/mL (ref <50)
Morphine Urine: NEGATIVE ng/mL (ref <50)
NOROXYCODONE, UR: NEGATIVE ng/mL (ref <50)
Norhydrocodone, Ur: NEGATIVE ng/mL (ref <50)
OXYCODONE, UR: NEGATIVE ng/mL (ref <50)
Oxymorphone: NEGATIVE ng/mL (ref <50)

## 2014-05-31 LAB — BENZODIAZEPINES (GC/LC/MS), URINE
Alprazolam metabolite (GC/LC/MS), ur confirm: NEGATIVE ng/mL (ref <25)
Clonazepam metabolite (GC/LC/MS), ur confirm: NEGATIVE ng/mL (ref <25)
Flurazepam metabolite (GC/LC/MS), ur confirm: NEGATIVE ng/mL (ref <50)
Lorazepam (GC/LC/MS), ur confirm: NEGATIVE ng/mL (ref <50)
MIDAZOLAMU: NEGATIVE ng/mL (ref <50)
Nordiazepam (GC/LC/MS), ur confirm: 70 ng/mL — AB (ref <50)
Oxazepam (GC/LC/MS), ur confirm: NEGATIVE ng/mL (ref <50)
Temazepam (GC/LC/MS), ur confirm: 97 ng/mL — AB (ref <50)
Triazolam metabolite (GC/LC/MS), ur confirm: NEGATIVE ng/mL (ref <50)

## 2014-05-31 LAB — AMPHETAMINES (GC/LC/MS), URINE
AMPHETAMINE CONF, UR: NEGATIVE ng/mL (ref <250)
METHAMPHETAMINE QUANT UR: NEGATIVE ng/mL (ref <250)

## 2014-05-31 LAB — ETHYL GLUCURONIDE, URINE
ETGU: 1254 ng/mL — AB (ref <500)
ETHYL SULFATE (ETS): 173 ng/mL — AB (ref <100)

## 2014-06-01 LAB — PRESCRIPTION MONITORING PROFILE (SOLSTAS)
Barbiturate Screen, Urine: NEGATIVE ng/mL
Buprenorphine, Urine: NEGATIVE ng/mL
Cannabinoid Scrn, Ur: NEGATIVE ng/mL
Carisoprodol, Urine: NEGATIVE ng/mL
Cocaine Metabolites: NEGATIVE ng/mL
Creatinine, Urine: 136.75 mg/dL (ref >20.0)
Fentanyl, Ur: NEGATIVE ng/mL
MDMA URINE: NEGATIVE ng/mL
Meperidine, Ur: NEGATIVE ng/mL
Methadone Screen, Urine: NEGATIVE ng/mL
NITRITES URINE, INITIAL: NEGATIVE ug/mL
Oxycodone Screen, Ur: NEGATIVE ng/mL
PH URINE, INITIAL: 6.7 pH (ref 4.5–8.9)
PROPOXYPHENE: NEGATIVE ng/mL
TAPENTADOLUR: NEGATIVE ng/mL
TRAMADOL UR: NEGATIVE ng/mL
Zolpidem, Urine: NEGATIVE ng/mL

## 2014-06-05 IMAGING — CR DG HAND COMPLETE 3+V*L*
3 series · 3 of 3 positions shown · non-contrast
Comparison: None.

CLINICAL DATA: Injured hand.  Pain.

EXAM:
LEFT HAND - COMPLETE 3+ VIEW

[PA]
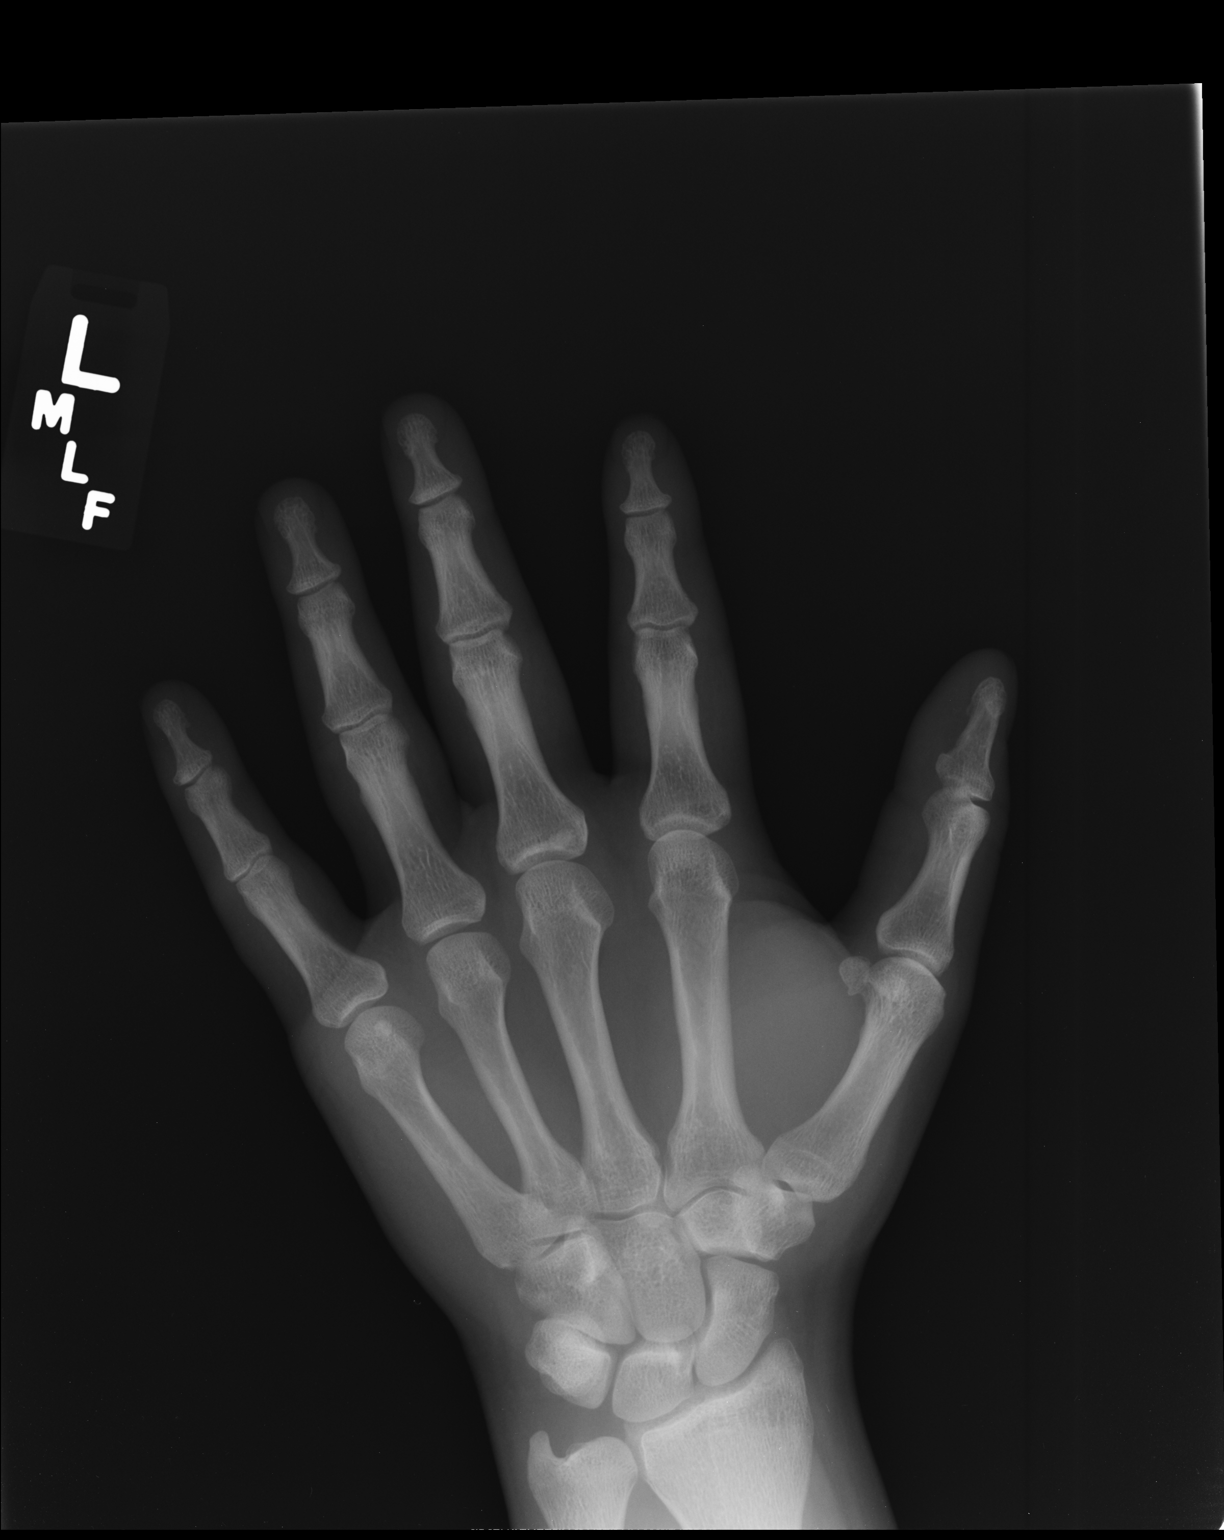

[lateral]
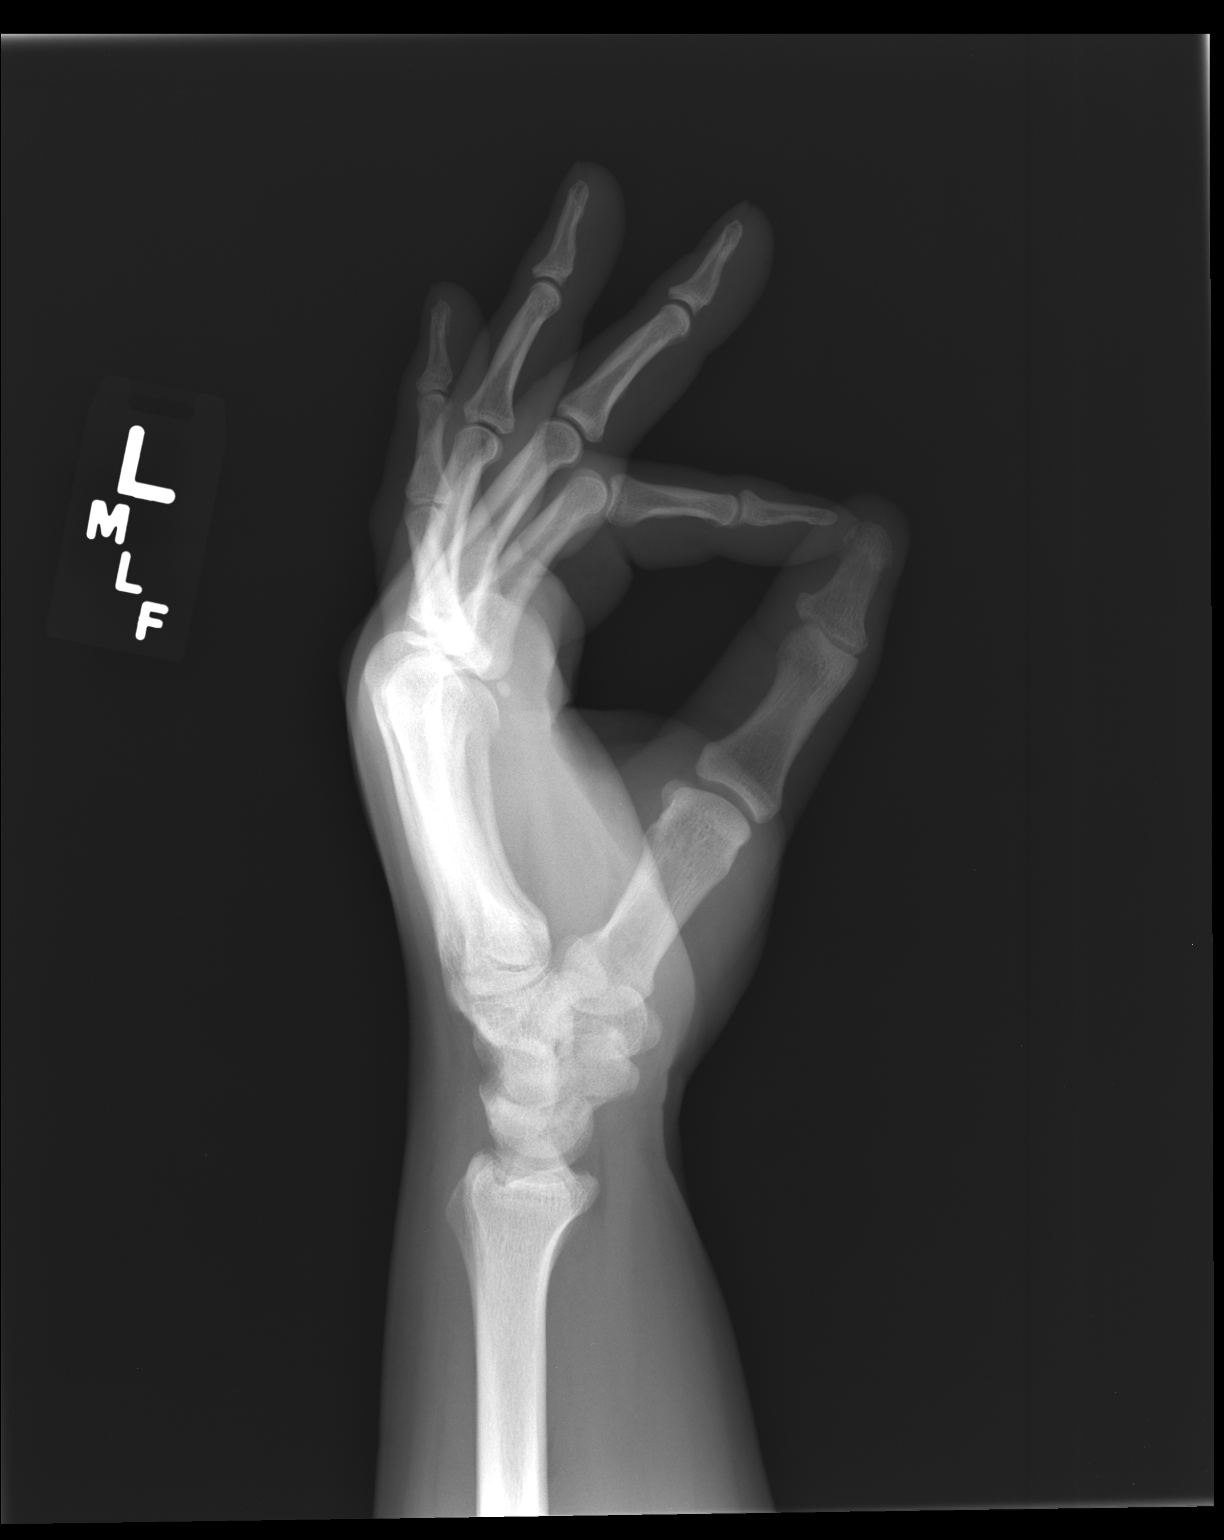

[pa obl]
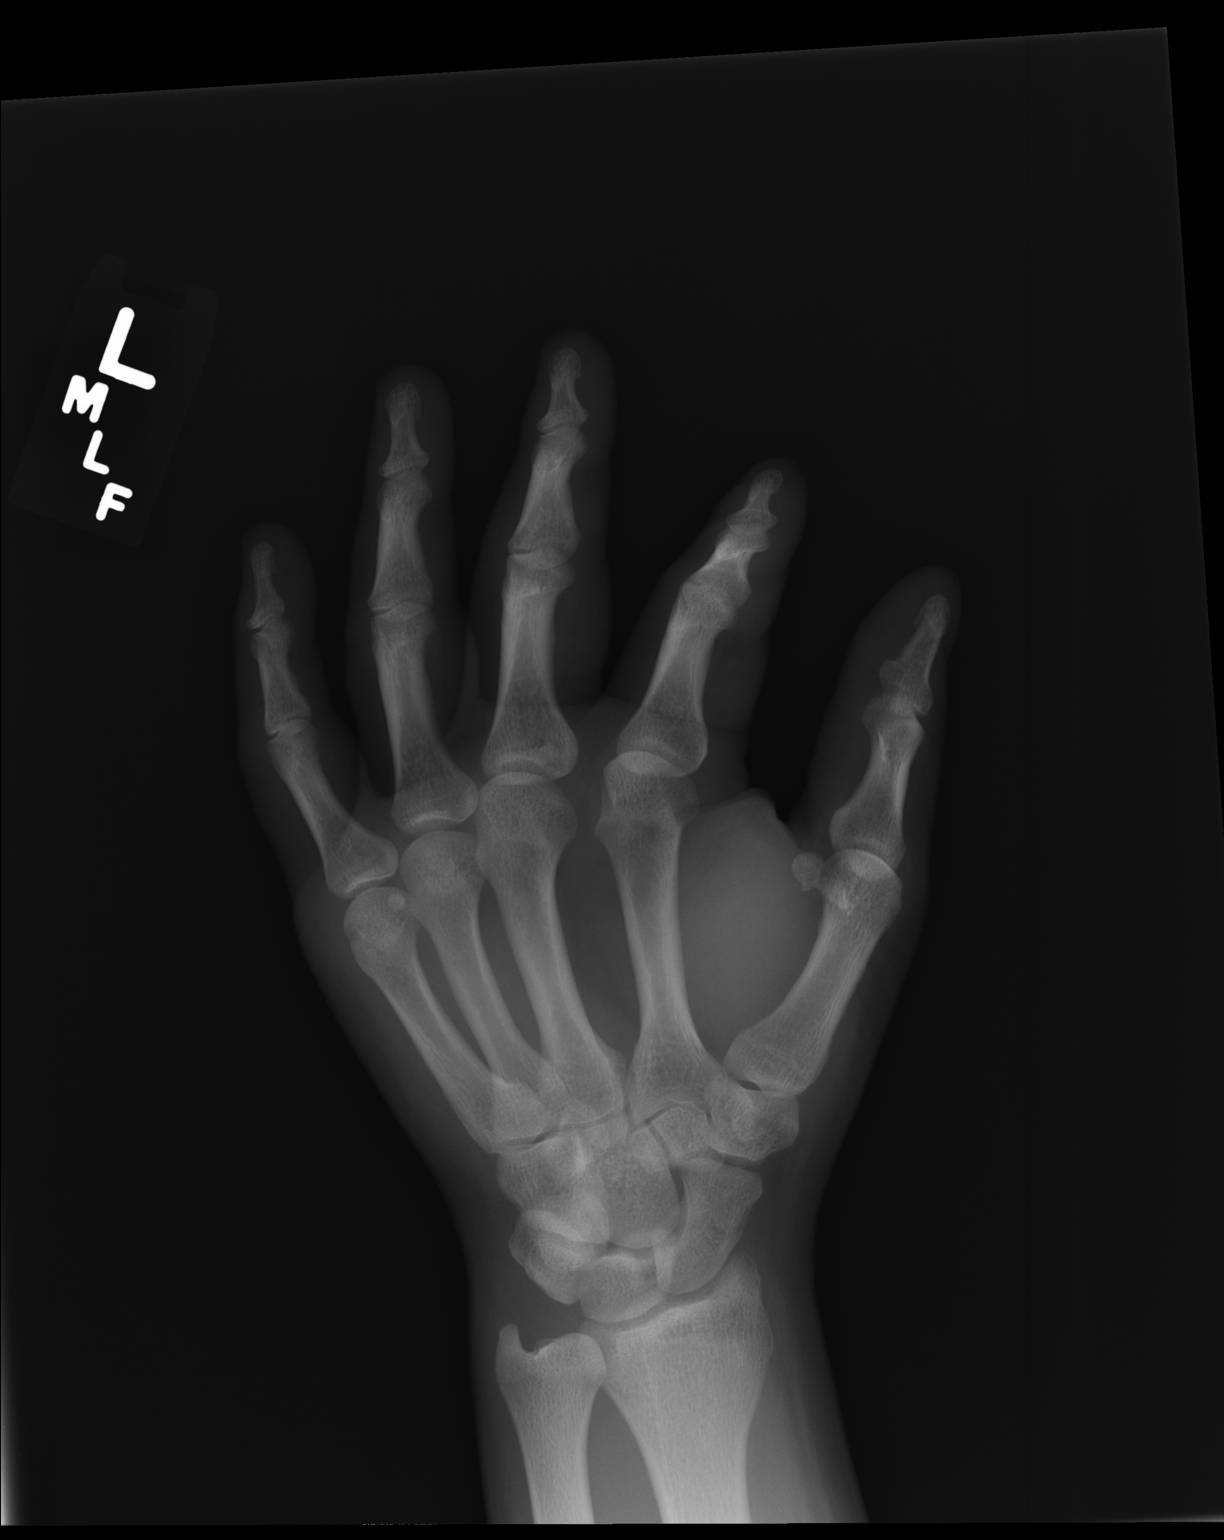

[3 of 3 positions shown; findings below may reference images not displayed]

FINDINGS: There is no evidence of fracture or dislocation. There is no
evidence of arthropathy or other focal bone abnormality. Soft
tissues are unremarkable.
IMPRESSION: Negative.

## 2014-06-15 ENCOUNTER — Telehealth: Payer: Self-pay | Admitting: *Deleted

## 2014-06-15 NOTE — Progress Notes (Signed)
Urine drug screen for this encounter is inconsistent. Positive for alcohol and metabolites of diazepam but no record of having been prescribed.

## 2014-06-15 NOTE — Telephone Encounter (Signed)
Pt called back a 2nd time@3 :35 pm asking the same ?

## 2014-06-15 NOTE — Telephone Encounter (Signed)
Calling about getting some pain medication.  His UDS was positive for alcohol and metabolite of diazepam. See phone message from 06/19/14

## 2014-06-15 NOTE — Telephone Encounter (Signed)
I do not see diazepam on his medication list, based on urine drug screent do not feel comfortableprescribing narcotic analgesics in this patient please inform patient

## 2014-06-19 NOTE — Telephone Encounter (Signed)
I spoke with Mr Shane Cole and notified him of the findings in the UDS and Dr Wynn BankerKirsteins reply.  He states the valium was from his urologist for a vasectomy.  I told him he should discuss this further at his next appointment.

## 2014-06-27 ENCOUNTER — Telehealth: Payer: Self-pay | Admitting: Physical Medicine & Rehabilitation

## 2014-06-30 ENCOUNTER — Encounter: Payer: Self-pay | Admitting: Physical Medicine & Rehabilitation

## 2014-06-30 ENCOUNTER — Ambulatory Visit (HOSPITAL_BASED_OUTPATIENT_CLINIC_OR_DEPARTMENT_OTHER): Payer: 59 | Admitting: Physical Medicine & Rehabilitation

## 2014-06-30 ENCOUNTER — Encounter: Payer: 59 | Attending: Physical Medicine & Rehabilitation

## 2014-06-30 VITALS — BP 158/81 | HR 112 | Resp 14

## 2014-06-30 DIAGNOSIS — G894 Chronic pain syndrome: Secondary | ICD-10-CM | POA: Diagnosis not present

## 2014-06-30 DIAGNOSIS — M89062 Algoneurodystrophy, left lower leg: Secondary | ICD-10-CM | POA: Diagnosis not present

## 2014-06-30 DIAGNOSIS — Z5181 Encounter for therapeutic drug level monitoring: Secondary | ICD-10-CM | POA: Diagnosis not present

## 2014-06-30 DIAGNOSIS — Z79899 Other long term (current) drug therapy: Secondary | ICD-10-CM | POA: Diagnosis not present

## 2014-06-30 DIAGNOSIS — G90529 Complex regional pain syndrome I of unspecified lower limb: Secondary | ICD-10-CM | POA: Insufficient documentation

## 2014-06-30 DIAGNOSIS — G90522 Complex regional pain syndrome I of left lower limb: Secondary | ICD-10-CM | POA: Diagnosis not present

## 2014-06-30 MED ORDER — GABAPENTIN 600 MG PO TABS: 600.0000 mg | ORAL_TABLET | Freq: Three times a day (TID) | ORAL | Status: DC

## 2014-06-30 MED ORDER — IBUPROFEN 800 MG PO TABS: 800.0000 mg | ORAL_TABLET | Freq: Three times a day (TID) | ORAL | Status: DC | PRN

## 2014-06-30 MED ORDER — TRAMADOL HCL 50 MG PO TABS: 50.0000 mg | ORAL_TABLET | Freq: Two times a day (BID) | ORAL | Status: DC | PRN

## 2014-06-30 MED ORDER — OXYCODONE HCL 5 MG PO TABS: 5.0000 mg | ORAL_TABLET | Freq: Every day | ORAL | Status: DC | PRN

## 2014-06-30 MED ORDER — NORTRIPTYLINE HCL 25 MG PO CAPS: 25.0000 mg | ORAL_CAPSULE | Freq: Every day | ORAL | Status: DC

## 2014-06-30 MED ORDER — LIDOCAINE 5 % EX OINT: 1.0000 "application " | TOPICAL_OINTMENT | CUTANEOUS | Status: DC | PRN

## 2014-06-30 NOTE — Patient Instructions (Signed)
Take tramadol for moderate pain, reserve oxycodone for severe pain

## 2014-06-30 NOTE — Progress Notes (Signed)
Subjective:    Patient ID: [name], male    DOB: 1980/09/30, 34 y.o.   MRN: [id number]  HPI Diazepam prescription from Urologist prior to procedure Discussed no alcohol use when taking Oxycodone, he takes < 1 Oxycodone per day Wife is a Associate Professor at [employer] outpatient pharmacy She helps monitor his medications.  Patient continues to have burning pain in his left foot, sometimes it feels cold and other times it feels warm, he has calluses on his foot and he has questions about these. He feels like he does not walk a normal heel toe walk because of his chronic left foot pain.  He may be undergoing foot surgery in the not too distant future with his podiatrist. I recommended lumbar sympathetic blocks preop as well as postop as well as regional anesthesia for the actual procedure  We discussed his positive EtOH on urine drug screen. He does not take oxycodone on a regular basis. We discussed that he cannot take oxycodone and alcohol together or even on the same day Pain Inventory Average Pain 4 Pain Right Now 6 My pain is sharp, burning, stabbing and aching  In the last 24 hours, has pain interfered with the following? General activity 9 Relation with others 7 Enjoyment of life 9 What TIME of day is your pain at its worst? daytime Sleep (in general) Poor  Pain is worse with: walking, bending, standing and some activites Pain improves with: rest, heat/ice, pacing activities and medication Relief from Meds: no selection  Mobility walk without assistance walk with assistance use a cane how many minutes can you walk? 30 ability to climb steps?  yes do you drive?  yes Do you have any goals in this area?  yes  Function not employed: date last employed .  Neuro/Psych weakness trouble walking  Prior Studies Any changes since last visit?  no  Physicians involved in your care Any changes since last visit?  no   Family History  Problem Relation Age of Onset  .  Hyperlipidemia Father   . Hypertension Father   . Anxiety disorder Father   . Drug abuse Father   . Cancer Paternal Grandfather     lung, colon  . Colon cancer Paternal Grandfather   . Lung cancer Paternal Grandfather   . Stroke    . Heart disease    . Diabetes Paternal Grandmother   . Cancer Paternal Grandmother   . Cancer Maternal Grandmother   . Cancer Maternal Grandfather   . Lung cancer Maternal Grandfather   . Depression Paternal Aunt   . Anxiety disorder Paternal Aunt   . Drug abuse Paternal Uncle    History   Social History  . Marital Status: Married    Spouse Name: N/A  . Number of Children: 1  . Years of Education: 10th grade   Occupational History  . Disabled     Previously worked Web designer   Social History Main Topics  . Smoking status: Current Every Day Smoker -- 0.50 packs/day for 20 years    Types: Cigarettes  . Smokeless tobacco: Never Used  . Alcohol Use: 3.6 oz/week    6 Cans of beer per week     Comment: occasional  . Drug Use: No  . Sexual Activity:    Partners: Female   Other Topics Concern  . None   Social History Narrative   05/24/12  Naod was born and grew up in [place], [place]. He  has 2 sisters. He reports that his childhood was "lousy." He completed the 10th grade. He has been married for 5 years, and is currently separated for 3 weeks. He has a daughter who is 523-1/55 years old. He has been unemployed for 2 years, and is disabled due to an on-the-job work accident. He is currently living with his aunt and cousin. He reports that his hobbies are sports and dogs. He reports that he is spiritual, but not religious. He states that his aunt and his father are his social support system. He denies any current legal problems, but got a DUI in 2007. 05/24/12 [facility]         Past Surgical History  Procedure Laterality Date  . Mass excision Left 01/25/2013    Procedure: EXCISION MASS DORSAL ASPECT LEFT LONG FINGER DISTAL  INTERPHALANGEAL JOINT;  Surgeon: Wyn Forsterobert V Sypher Jr., MD;  Location: [facility];  Service: Orthopedics;  Laterality: Left;  Left long   . Mouth surgery    . Hand surgery    . Colonoscopy    . Cholecystectomy N/A 01/24/2014    Procedure: LAPAROSCOPIC CHOLECYSTECTOMY;  Surgeon: Axel FillerArmando Ramirez, MD;  [facility];  Service: General;  Laterality: N/A;   Past Medical History  Diagnosis Date  . Nerve pain   . Crush injury lower leg     Left lower leg  . Depression   . Migraine   . Bipolar disorder   . Allergy   . Anxiety   . Gastric ulcer   . GERD (gastroesophageal reflux disease)     will awaken him in night  . RSD (reflex sympathetic dystrophy)    BP 158/81 mmHg  Pulse 112  Resp 14  SpO2 100%  Opioid Risk Score:   Fall Risk Score: Low Fall Risk (0-5 points)`1  Depression screen PHQ 2/9  No flowsheet data found.  2 Review of Systems  Musculoskeletal: Positive for gait problem.  Neurological: Positive for weakness.  All other systems reviewed and are negative.      Objective:   Physical Exam  Constitutional: He is oriented to person, place, and time. He appears well-developed and well-nourished.  HENT:  Head: Normocephalic and atraumatic.  Eyes: Conjunctivae and EOM are normal. Pupils are equal, round, and reactive to light.  Neurological: He is alert and oriented to person, place, and time.  Motor strength is 3 minus toe flexors and toe extensors as well as ankle inversion eversion dorsiflexion plantar flexion on the left side, this is pain inhibition  Psychiatric: He has a normal mood and affect.  Nursing note and vitals reviewed.   Left foot has mild cyanosis of the toes. He has hypersensitivity to touch over the dorsum of the foot. He has calluses over the fifth metatarsal head as well as the first metatarsal head as well as the heel.      Assessment & Plan:  1 reflex sympathetic dystrophy related to crush injury left foot. He has a nonunion  fracture at the second metatarsal head He is planning for operative repair of the nonunion fracture. I am recommending 1 or 2 lumbar sympathetic blocks preoperatively as well as postoperatively  Podiatry can prescribe postoperative medications. His oxycodone is prescribed as 5 mg per day as needed but based on his history this should last him at least a couple months In addition we started tramadol 50 mg per day as needed, he has had some good relief of foot pain with this medication while taking it  for post vasectomy pain We discussed the tramadol can be taken for moderate pain and oxycodone for severe pain  Nurse practitioner visit one month

## 2014-07-28 ENCOUNTER — Encounter: Payer: 59 | Admitting: Registered Nurse

## 2014-07-31 ENCOUNTER — Encounter: Payer: 59 | Attending: Physical Medicine & Rehabilitation | Admitting: Registered Nurse

## 2014-07-31 ENCOUNTER — Encounter: Payer: Self-pay | Admitting: Registered Nurse

## 2014-07-31 VITALS — BP 151/84 | HR 101 | Resp 14

## 2014-07-31 DIAGNOSIS — G894 Chronic pain syndrome: Secondary | ICD-10-CM | POA: Diagnosis present

## 2014-07-31 DIAGNOSIS — M25511 Pain in right shoulder: Secondary | ICD-10-CM

## 2014-07-31 DIAGNOSIS — M89062 Algoneurodystrophy, left lower leg: Secondary | ICD-10-CM | POA: Diagnosis not present

## 2014-07-31 DIAGNOSIS — Z5181 Encounter for therapeutic drug level monitoring: Secondary | ICD-10-CM | POA: Diagnosis not present

## 2014-07-31 DIAGNOSIS — Z79899 Other long term (current) drug therapy: Secondary | ICD-10-CM | POA: Insufficient documentation

## 2014-07-31 DIAGNOSIS — G90522 Complex regional pain syndrome I of left lower limb: Secondary | ICD-10-CM | POA: Diagnosis not present

## 2014-07-31 DIAGNOSIS — M7541 Impingement syndrome of right shoulder: Secondary | ICD-10-CM

## 2014-07-31 MED ORDER — OXYCODONE HCL 5 MG PO TABS: 5.0000 mg | ORAL_TABLET | Freq: Every day | ORAL | Status: DC | PRN

## 2014-07-31 NOTE — Progress Notes (Signed)
Subjective:    Patient ID: Shane LandryPaul D Falor Jr., male    DOB: 03/06/1981, 34 y.o.   MRN: [id number]  HPI: Shane Cole is a 34 year old male who returns for follow up for chronic pain and medication refill. He says his pain is located in his right shoulder and left foot. Occasionally he has shooting pain in his left foot. He rates his pain 5. His current exercise regime walking and yard work.  Pain Inventory Average Pain 3 Pain Right Now 5 My pain is intermittent, sharp, burning, stabbing, tingling and aching  In the last 24 hours, has pain interfered with the following? General activity 7 Relation with others 3 Enjoyment of life 8 What TIME of day is your pain at its worst? daytime Sleep (in general) Fair  Pain is worse with: walking, bending, standing and some activites Pain improves with: rest, heat/ice, medication and injections Relief from Meds: 8  Mobility walk without assistance walk with assistance use a cane how many minutes can you walk? 60 ability to climb steps?  yes do you drive?  yes Do you have any goals in this area?  yes  Function not employed: date last employed 2014 Do you have any goals in this area?  yes  Neuro/Psych trouble walking anxiety  Prior Studies Any changes since last visit?  no  Physicians involved in your care Any changes since last visit?  no   Family History  Problem Relation Age of Onset  . Hyperlipidemia Father   . Hypertension Father   . Anxiety disorder Father   . Drug abuse Father   . Cancer Paternal Grandfather     lung, colon  . Colon cancer Paternal Grandfather   . Lung cancer Paternal Grandfather   . Stroke    . Heart disease    . Diabetes Paternal Grandmother   . Cancer Paternal Grandmother   . Cancer Maternal Grandmother   . Cancer Maternal Grandfather   . Lung cancer Maternal Grandfather   . Depression Paternal Aunt   . Anxiety disorder Paternal Aunt   . Drug abuse Paternal Uncle    History    Social History  . Marital Status: Married    Spouse Name: N/A  . Number of Children: 1  . Years of Education: 10th grade   Occupational History  . Disabled     Previously worked Web designerinstalling granite counter tops   Social History Main Topics  . Smoking status: Current Every Day Smoker -- 0.50 packs/day for 20 years    Types: Cigarettes  . Smokeless tobacco: Never Used  . Alcohol Use: 3.6 oz/week    6 Cans of beer per week     Comment: occasional  . Drug Use: No  . Sexual Activity:    Partners: Female   Other Topics Concern  . None   Social History Narrative   05/24/12  Shane Cole was born [place] He has 2 sisters. He reports that his childhood was "lousy." He completed the 10th grade. He has been married for 5 years, and is currently separated for 3 weeks. He has a daughter who is 463-1/52 years old. He has been unemployed for 2 years, and is disabled due to an on-the-job work accident. He is currently living with his aunt and cousin. He reports that his hobbies are sports and dogs. He reports that he is spiritual, but not religious. He states that his aunt and his father are his  social support system. He denies any current legal problems, but got a DUI in 2007. 05/24/12 AHW         Past Surgical History  Procedure Laterality Date  . Mass excision Left 01/25/2013    Procedure: EXCISION MASS DORSAL ASPECT LEFT LONG FINGER DISTAL INTERPHALANGEAL JOINT;  Surgeon: Wyn Forsterobert V Sypher Jr., MD;  Location: [facility];  Service: Orthopedics;  Laterality: Left;  Left long   . Mouth surgery    . Hand surgery    . Colonoscopy    . Cholecystectomy N/A 01/24/2014    Procedure: LAPAROSCOPIC CHOLECYSTECTOMY;  Surgeon: Axel FillerArmando Ramirez, MD;  [facility];  Service: General;  Laterality: N/A;   Past Medical History  Diagnosis Date  . Nerve pain   . Crush injury lower leg     Left lower leg  . Depression   . Migraine   . Bipolar disorder   . Allergy   .  Anxiety   . Gastric ulcer   . GERD (gastroesophageal reflux disease)     will awaken him in night  . RSD (reflex sympathetic dystrophy)    BP 151/84 mmHg  Pulse 101  Resp 14  SpO2 100%  Opioid Risk Score:   Fall Risk Score: Low Fall Risk (0-5 points)`1  Depression screen PHQ 2/9  No flowsheet data found.   Review of Systems  Constitutional:       Night sweats Poor appetite  Respiratory: Positive for shortness of breath.   Gastrointestinal: Positive for diarrhea.  Musculoskeletal: Positive for gait problem.  Psychiatric/Behavioral: The patient is nervous/anxious.   All other systems reviewed and are negative.      Objective:   Physical Exam  Constitutional: He is oriented to person, place, and time. He appears well-developed and well-nourished.  HENT:  Head: Normocephalic and atraumatic.  Neck: Normal range of motion. Neck supple.  Cardiovascular: Normal rate and regular rhythm.   Pulmonary/Chest: Effort normal and breath sounds normal.  Musculoskeletal:  Normal Muscle Bulk and Muscle Testing Reveals: Upper Extremities: Right AC Joint/ Sweeling 90 Degrees Left: Upper Extremities: Full ROM and Muscle Strength 5/5 Lower Extremities: Full ROM and Muscle Strength 5/5 Arises from chair with ease Narrow Based Gait  Neurological: He is alert and oriented to person, place, and time.  Skin: Skin is warm and dry.  Psychiatric: He has a normal mood and affect.  Nursing note and vitals reviewed.         Assessment & Plan:  1 Reflex Sympathetic Dystrophy related to crush injury left foot. 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 Refilled: Oxycodone 5 mg daily #30. Continue Tramadol. 2. Right Shoulder Pain/ Right Shoulder Impingement: RX: Right Shoulder X-ray. Continue with Ice and Exercise Regime.  20 minutes of face to face patient care time was spent during this visit. All questions were encouraged answered.  F/U in 1 month

## 2014-08-28 ENCOUNTER — Ambulatory Visit: Payer: 59

## 2014-08-28 ENCOUNTER — Ambulatory Visit: Payer: 59 | Admitting: Physical Medicine & Rehabilitation

## 2014-09-04 ENCOUNTER — Telehealth: Payer: Self-pay | Admitting: *Deleted

## 2014-09-04 MED ORDER — OXYCODONE HCL 5 MG PO TABS: ORAL_TABLET | ORAL | Status: DC

## 2014-09-04 NOTE — Telephone Encounter (Signed)
Patient called and asked for a refill on his pain medication.  His last appt for 6/6 was rescheduled and patient stated that he is out of medication.  He scheduled his appt for this week 6/16 and [facility] has no openings before that day. I spoke with Johnette and she said that it will be OK for this time ONLY.  Printed out RX to have Doctor sign.

## 2014-09-04 NOTE — Telephone Encounter (Signed)
Called patient to let him know that his RX was ready for pick up.  Patient mail box is full, but left our call back number [phone].  Will put RX in pick up book with note - this is an exception.  NO prescriptions will be given with out seeing provider.  Please review office policy with patient. The exception was only made because has appt scheduled for 06/16. I am not closing encounter because I could not leave message, just call back number

## 2014-09-07 ENCOUNTER — Encounter: Payer: 59 | Attending: Physical Medicine & Rehabilitation

## 2014-09-07 ENCOUNTER — Other Ambulatory Visit: Payer: Self-pay | Admitting: Physical Medicine & Rehabilitation

## 2014-09-07 ENCOUNTER — Encounter: Payer: Self-pay | Admitting: Physical Medicine & Rehabilitation

## 2014-09-07 ENCOUNTER — Ambulatory Visit (HOSPITAL_BASED_OUTPATIENT_CLINIC_OR_DEPARTMENT_OTHER): Payer: 59 | Admitting: Physical Medicine & Rehabilitation

## 2014-09-07 VITALS — BP 151/87 | HR 100 | Resp 16

## 2014-09-07 DIAGNOSIS — G894 Chronic pain syndrome: Secondary | ICD-10-CM

## 2014-09-07 DIAGNOSIS — M7551 Bursitis of right shoulder: Secondary | ICD-10-CM | POA: Diagnosis not present

## 2014-09-07 DIAGNOSIS — Z5181 Encounter for therapeutic drug level monitoring: Secondary | ICD-10-CM | POA: Insufficient documentation

## 2014-09-07 DIAGNOSIS — M89062 Algoneurodystrophy, left lower leg: Secondary | ICD-10-CM | POA: Insufficient documentation

## 2014-09-07 DIAGNOSIS — Z79899 Other long term (current) drug therapy: Secondary | ICD-10-CM | POA: Diagnosis not present

## 2014-09-07 DIAGNOSIS — G90522 Complex regional pain syndrome I of left lower limb: Secondary | ICD-10-CM

## 2014-09-07 DIAGNOSIS — M25511 Pain in right shoulder: Secondary | ICD-10-CM

## 2014-09-07 MED ORDER — NORTRIPTYLINE HCL 50 MG PO CAPS: 50.0000 mg | ORAL_CAPSULE | Freq: Every day | ORAL | Status: DC

## 2014-09-07 MED ORDER — LIDOCAINE 5 % EX OINT: 1.0000 "application " | TOPICAL_OINTMENT | CUTANEOUS | Status: DC | PRN

## 2014-09-07 MED ORDER — OXYCODONE HCL 5 MG PO TABS: ORAL_TABLET | ORAL | Status: DC

## 2014-09-07 NOTE — Patient Instructions (Signed)
Call Dr Alvester Morin at [facility] for Lumbar sympathetic injection

## 2014-09-07 NOTE — Progress Notes (Signed)
Shoulder injection Right subacromial   Indication:Right Shoulder pain not relieved by medication management and other conservative care.  Informed consent was obtained after describing risks and benefits of the procedure with the patient, this includes bleeding, bruising, infection and medication side effects. The patient wishes to proceed and has given written consent. Patient was placed in a seated position. The Right shoulder was marked and prepped with betadine in the subacromial area. A 25-gauge 1-1/2 inch needle was inserted into the subacromial area. After negative draw back for blood, a solution containing 1 mL of 6 mg per ML betamethasone and 4 mL of 1% lidocaine was injected. A band aid was applied. The patient tolerated the procedure well. Post procedure instructions were given.

## 2014-09-08 LAB — PMP ALCOHOL METABOLITE (ETG): Ethyl Glucuronide (EtG): NEGATIVE ng/mL

## 2014-09-11 LAB — OPIATES/OPIOIDS (LC/MS-MS)
Codeine Urine: NEGATIVE ng/mL (ref <50)
HYDROCODONE: NEGATIVE ng/mL (ref <50)
Hydromorphone: NEGATIVE ng/mL (ref <50)
Morphine Urine: NEGATIVE ng/mL (ref <50)
NORHYDROCODONE, UR: NEGATIVE ng/mL (ref <50)
Noroxycodone, Ur: 4908 ng/mL (ref <50)
Oxycodone, ur: 3627 ng/mL (ref <50)
Oxymorphone: 3339 ng/mL (ref <50)

## 2014-09-11 LAB — OXYCODONE, URINE (LC/MS-MS)
NOROXYCODONE, UR: 4908 ng/mL (ref <50)
OXYMORPHONE, URINE: 3339 ng/mL (ref <50)
Oxycodone, ur: 3627 ng/mL (ref <50)

## 2014-09-12 ENCOUNTER — Telehealth: Payer: Self-pay | Admitting: Gastroenterology

## 2014-09-12 LAB — PRESCRIPTION MONITORING PROFILE (SOLSTAS)
Amphetamine/Meth: NEGATIVE ng/mL
BARBITURATE SCREEN, URINE: NEGATIVE ng/mL
Benzodiazepine Screen, Urine: NEGATIVE ng/mL
Buprenorphine, Urine: NEGATIVE ng/mL
CANNABINOID SCRN UR: NEGATIVE ng/mL
COCAINE METABOLITES: NEGATIVE ng/mL
CREATININE, URINE: 156.3 mg/dL (ref >20.0)
Carisoprodol, Urine: NEGATIVE ng/mL
FENTANYL URINE: NEGATIVE ng/mL
MDMA URINE: NEGATIVE ng/mL
Meperidine, Ur: NEGATIVE ng/mL
Methadone Screen, Urine: NEGATIVE ng/mL
Nitrites, Initial: NEGATIVE ug/mL
Propoxyphene: NEGATIVE ng/mL
Tapentadol, urine: NEGATIVE ng/mL
Tramadol Scrn, Ur: NEGATIVE ng/mL
ZOLPIDEM, URINE: NEGATIVE ng/mL
pH, Initial: 5.6 pH (ref 4.5–8.9)

## 2014-09-13 NOTE — Telephone Encounter (Signed)
Pt had gallbladder surgery in Oct 2015 and is having pain at the incision site.  The pt was advised to call the surgeon that removed the gallbladder and if they feel he needs to be seen at GI we can get him an appt.  Pt agreed

## 2014-09-13 NOTE — Telephone Encounter (Signed)
Pt voice mail is full and not accepting messages

## 2014-09-20 NOTE — Progress Notes (Signed)
Urine drug screen for this encounter is consistent for prescribed medication oxycodone. Tramadol is not present but reports takes only as needed.

## 2014-09-30 ENCOUNTER — Ambulatory Visit (INDEPENDENT_AMBULATORY_CARE_PROVIDER_SITE_OTHER): Payer: 59 | Admitting: Family Medicine

## 2014-09-30 VITALS — BP 100/62 | HR 97 | Temp 98.6°F | Resp 18 | Ht 69.25 in | Wt 224.8 lb

## 2014-09-30 DIAGNOSIS — L03317 Cellulitis of buttock: Secondary | ICD-10-CM | POA: Diagnosis not present

## 2014-09-30 MED ORDER — DOXYCYCLINE HYCLATE 100 MG PO CAPS: 100.0000 mg | ORAL_CAPSULE | Freq: Two times a day (BID) | ORAL | Status: AC

## 2014-09-30 MED ORDER — TRAZODONE HCL 50 MG PO TABS: 25.0000 mg | ORAL_TABLET | Freq: Every evening | ORAL | Status: DC | PRN

## 2014-09-30 MED ORDER — DOXYCYCLINE HYCLATE 100 MG PO CAPS
100.0000 mg | ORAL_CAPSULE | Freq: Two times a day (BID) | ORAL | Status: DC
Start: 2014-09-30 — End: 2014-09-30

## 2014-09-30 NOTE — Progress Notes (Signed)
Patient ID: Shane LandryPaul D Schnetzer Jr., male   DOB: 02/06/1981, 34 y.o.   MRN: [id number] Reviewed documentation and agree w/ assessment and plan. Norberto SorensonEva Shaw, MD MPH

## 2014-09-30 NOTE — Progress Notes (Signed)
    MRN: [id number] DOB: 02/02/1981  Subjective:   Shane LandryPaul D Cosby Jr. is a 34 y.o. male presenting for chief complaint of Recurrent Skin Infections  Reports 3 day history of left buttock skin infection. States that area stings, is slightly swollen and has been difficult for him to sit and sleep give it's location. Patient has been "picking at it" and has gotten slight amount of clear fluid. Has tried peroxide with minimal relief. Denies fevers, drainage of pus, bleeding, dysuria, hematuria, scrotal pain. Denies any other aggravating or relieving factors, no other questions or concerns.  Shane Cole has a current medication list which includes the following prescription(s): gabapentin, ibuprofen, lidocaine, multivitamin with minerals, nortriptyline, oxycodone, pantoprazole, ranitidine, and tramadol. He is allergic to aspirin; codeine; penicillins; and amitriptyline.  Shane Cole  has a past medical history of Nerve pain; Crush injury lower leg; Depression; Migraine; Bipolar disorder; Allergy; Anxiety; Gastric ulcer; GERD (gastroesophageal reflux disease); and RSD (reflex sympathetic dystrophy). Also  has past surgical history that includes Mass excision (Left, 01/25/2013); Mouth surgery; Hand surgery; Colonoscopy; and Cholecystectomy (N/A, 01/24/2014).  ROS As in subjective.  Objective:   Vitals: BP 100/62 mmHg  Pulse 97  Temp(Src) 98.6 F (37 C) (Oral)  Resp 18  Ht 5' 9.25" (1.759 m)  Wt 224 lb 12.8 oz (101.969 kg)  BMI 32.96 kg/m2  SpO2 96%  Physical Exam  Constitutional: He is oriented to person, place, and time. He appears well-developed and well-nourished.  Cardiovascular: Normal rate.   Pulmonary/Chest: Effort normal.  Genitourinary:     Neurological: He is alert and oriented to person, place, and time.  Skin: Skin is warm and dry. No rash noted. There is erythema. No pallor.   Assessment and Plan :   1. Cellulitis of right buttock - Start doxycycline, currently not an abscess but  counseled patient on signs and symptoms of abscess. Recommended patient keep area covered with non-stick gauze. I&D was not performed given lack of area of fluctuance and mostly indurated tissue. Recheck tomorrow, if fluctuance has developed or there is worsening of symptoms, will perform I&D.  Wallis BambergMario Kylin Genna, PA-C [facility] [phone] 09/30/2014 4:08 PM

## 2014-09-30 NOTE — Patient Instructions (Signed)

## 2014-10-04 ENCOUNTER — Other Ambulatory Visit: Payer: Self-pay | Admitting: Gastroenterology

## 2014-10-12 ENCOUNTER — Encounter: Payer: 59 | Attending: Physical Medicine & Rehabilitation | Admitting: Registered Nurse

## 2014-10-12 ENCOUNTER — Encounter: Payer: Self-pay | Admitting: Registered Nurse

## 2014-10-12 VITALS — BP 143/93 | HR 107

## 2014-10-12 DIAGNOSIS — Z5181 Encounter for therapeutic drug level monitoring: Secondary | ICD-10-CM | POA: Diagnosis not present

## 2014-10-12 DIAGNOSIS — M89062 Algoneurodystrophy, left lower leg: Secondary | ICD-10-CM | POA: Diagnosis not present

## 2014-10-12 DIAGNOSIS — G894 Chronic pain syndrome: Secondary | ICD-10-CM | POA: Diagnosis not present

## 2014-10-12 DIAGNOSIS — Z79899 Other long term (current) drug therapy: Secondary | ICD-10-CM | POA: Insufficient documentation

## 2014-10-12 DIAGNOSIS — G90522 Complex regional pain syndrome I of left lower limb: Secondary | ICD-10-CM

## 2014-10-12 DIAGNOSIS — M25511 Pain in right shoulder: Secondary | ICD-10-CM

## 2014-10-12 MED ORDER — LIDOCAINE 5 % EX OINT: 1.0000 "application " | TOPICAL_OINTMENT | CUTANEOUS | Status: DC | PRN

## 2014-10-12 MED ORDER — OXYCODONE HCL 5 MG PO TABS: ORAL_TABLET | ORAL | Status: DC

## 2014-10-12 MED ORDER — TRAZODONE HCL 50 MG PO TABS: 25.0000 mg | ORAL_TABLET | Freq: Every evening | ORAL | Status: DC | PRN

## 2014-10-12 MED ORDER — MELOXICAM 7.5 MG PO TABS: 7.5000 mg | ORAL_TABLET | Freq: Every day | ORAL | Status: DC

## 2014-10-12 NOTE — Progress Notes (Signed)
Subjective:    Patient ID: Shane Cole., male    DOB: 23-Oct-1980, 34 y.o.   MRN: [id number]  HPI: [name] is a 34 year old male who returns for follow up for chronic pain and medication refill. He says his pain is located in his right shoulder and left foot.He rates his pain 6. His current exercise regime walking and yard work. S/P right shoulder injection with 50% relief noted.  Pain Inventory Average Pain 5 Pain Right Now 6 My pain is sharp, burning, stabbing and aching  In the last 24 hours, has pain interfered with the following? General activity 6 Relation with others 3 Enjoyment of life 5 What TIME of day is your pain at its worst? evening Sleep (in general) Poor  Pain is worse with: walking, bending, standing and some activites Pain improves with: rest, heat/ice, pacing activities, medication and injections Relief from Meds: 6  Mobility walk without assistance use a cane how many minutes can you walk? 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  no  Physicians involved in your care Any changes since last visit?  no   Family History  Problem Relation Age of Onset  . Hyperlipidemia Father   . Hypertension Father   . Anxiety disorder Father   . Drug abuse Father   . Cancer Paternal Grandfather     lung, colon  . Colon cancer Paternal Grandfather   . Lung cancer Paternal Grandfather   . Stroke    . Heart disease    . Diabetes Paternal Grandmother   . Cancer Paternal Grandmother   . Cancer Maternal Grandmother   . Cancer Maternal Grandfather   . Lung cancer Maternal Grandfather   . Depression Paternal Aunt   . Anxiety disorder Paternal Aunt   . Drug abuse Paternal Uncle    History   Social History  . Marital Status: Married    Spouse Name: N/A  . Number of Children: 1  . Years of Education: 10th grade    Occupational History  . Disabled     Previously worked Web designer   Social History Main Topics  . Smoking status: Current Every Day Smoker -- 0.50 packs/day for 20 years    Types: Cigarettes  . Smokeless tobacco: Never Used  . Alcohol Use: 3.6 oz/week    6 Cans of beer per week     Comment: occasional  . Drug Use: No  . Sexual Activity:    Partners: Female   Other Topics Concern  . None   Social History Narrative   05/24/12  Breydon was born and grew up in [place], [place]. He has 2 sisters. He reports that his childhood was "lousy." He completed the 10th grade. He has been married for 5 years, and is currently separated for 3 weeks. He has a daughter who is 45-1/2 years old. He has been unemployed for 2 years, and is disabled due to an on-the-job work accident. He is currently living with his aunt and cousin. He reports that his hobbies are sports and dogs. He reports that he is spiritual, but not religious. He states that his aunt and his father are his social support system. He denies any current legal problems, but got a DUI in 2007. 05/24/12 AHW         Past Surgical History  Procedure  Laterality Date  . Mass excision Left 01/25/2013    Procedure: EXCISION MASS DORSAL ASPECT LEFT LONG FINGER DISTAL INTERPHALANGEAL JOINT;  Surgeon: Wyn Forster., MD;  Location: [facility];  Service: Orthopedics;  Laterality: Left;  Left long   . Mouth surgery    . Hand surgery    . Colonoscopy    . Cholecystectomy N/A 01/24/2014    Procedure: LAPAROSCOPIC CHOLECYSTECTOMY;  Surgeon: Axel Filler, MD;  Location[facility];  Service: General;  Laterality: N/A;   Past Medical History  Diagnosis Date  . Nerve pain   . Crush injury lower leg     Left lower leg  . Depression   . Migraine   . Bipolar disorder   . Allergy   . Anxiety   . Gastric ulcer   . GERD (gastroesophageal reflux disease)     will awaken him in night  . RSD (reflex sympathetic  dystrophy)    BP 143/91  P 107  O2 sat 98%  R16 Opioid Risk Score:   Fall Risk Score:  `1  Depression screen PHQ 2/9  Depression screen [facility] 2/9 10/12/2014 10/12/2014 09/30/2014  Decreased Interest 0 0 0  Down, Depressed, Hopeless 0 0 0  PHQ - 2 Score 0 0 0  Altered sleeping 2 - -  Tired, decreased energy 0 - -  Change in appetite 1 - -  Feeling bad or failure about yourself  0 - -  Trouble concentrating 0 - -  Moving slowly or fidgety/restless 0 - -  Suicidal thoughts 0 - -  PHQ-9 Score 3 - -     Review of Systems  Musculoskeletal: Positive for gait problem.  Psychiatric/Behavioral: Positive for sleep disturbance.  All other systems reviewed and are negative.      Objective:   Physical Exam  Constitutional: He is oriented to person, place, and time. He appears well-developed and well-nourished.  HENT:  Head: Normocephalic and atraumatic.  Neck: Normal range of motion. Neck supple.  Cardiovascular: Normal rate and regular rhythm.   Pulmonary/Chest: Effort normal and breath sounds normal.  Musculoskeletal:  Normal Muscle Bulk and Muscle Testing Reveals: Upper Extremities: Full ROM and Muscle Strength 5/5 Thoracic Paraspinal Tenderness: T_ 6- T-8 Mainly Right Side Lower Extremities: Full ROM and Muscle Strength 5/5 Arises from Chair with Ease Narrow Based Gait  Neurological: He is alert and oriented to person, place, and time.  Skin: Skin is warm and dry.  Psychiatric: He has a normal mood and affect.  Nursing note and vitals reviewed.         Assessment & Plan:  1 Reflex Sympathetic Dystrophy related to crush injury left foot. 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 Refilled: Oxycodone 5 mg daily #30. Continue Tramadol. Continue with Ice and Exercise Regime.  20 minutes of face to face patient care time was spent during this visit. All questions were encouraged answered.  F/U in 1 month

## 2014-10-25 ENCOUNTER — Ambulatory Visit (HOSPITAL_COMMUNITY)
Admission: RE | Admit: 2014-10-25 | Discharge: 2014-10-25 | Disposition: A | Discharge status: Home / Self Care | Payer: 59 | Source: Ambulatory Visit | Attending: Family Medicine | Admitting: Family Medicine

## 2014-10-25 ENCOUNTER — Ambulatory Visit (INDEPENDENT_AMBULATORY_CARE_PROVIDER_SITE_OTHER): Payer: 59 | Admitting: Family Medicine

## 2014-10-25 VITALS — BP 126/72 | HR 98 | Temp 98.1°F | Resp 14 | Ht 69.5 in | Wt 226.4 lb

## 2014-10-25 DIAGNOSIS — H539 Unspecified visual disturbance: Secondary | ICD-10-CM | POA: Insufficient documentation

## 2014-10-25 DIAGNOSIS — R110 Nausea: Secondary | ICD-10-CM | POA: Insufficient documentation

## 2014-10-25 DIAGNOSIS — H53133 Sudden visual loss, bilateral: Secondary | ICD-10-CM

## 2014-10-25 DIAGNOSIS — R202 Paresthesia of skin: Secondary | ICD-10-CM | POA: Diagnosis not present

## 2014-10-25 DIAGNOSIS — Z0289 Encounter for other administrative examinations: Secondary | ICD-10-CM

## 2014-10-25 DIAGNOSIS — Z79891 Long term (current) use of opiate analgesic: Secondary | ICD-10-CM

## 2014-10-25 DIAGNOSIS — G43809 Other migraine, not intractable, without status migrainosus: Secondary | ICD-10-CM | POA: Diagnosis not present

## 2014-10-25 DIAGNOSIS — F1190 Opioid use, unspecified, uncomplicated: Secondary | ICD-10-CM | POA: Diagnosis not present

## 2014-10-25 DIAGNOSIS — R200 Anesthesia of skin: Secondary | ICD-10-CM

## 2014-10-25 DIAGNOSIS — R112 Nausea with vomiting, unspecified: Secondary | ICD-10-CM | POA: Diagnosis not present

## 2014-10-25 DIAGNOSIS — R51 Headache: Secondary | ICD-10-CM | POA: Diagnosis present

## 2014-10-25 LAB — POCT CBC
Granulocyte percent: 67.6 % (ref 37–80)
HCT, POC: 47.4 % (ref 43.5–53.7)
Hemoglobin: 15.2 g/dL (ref 14.1–18.1)
Lymph, poc: 3 (ref 0.6–3.4)
MCH, POC: 29.1 pg (ref 27–31.2)
MCHC: 32.1 g/dL (ref 31.8–35.4)
MCV: 90.7 fL (ref 80–97)
MID (cbc): 0.5 (ref 0–0.9)
MPV: 6.8 fL (ref 0–99.8)
POC Granulocyte: 7.4 — AB (ref 2–6.9)
POC LYMPH PERCENT: 27.4 % (ref 10–50)
POC MID %: 5 % (ref 0–12)
Platelet Count, POC: 247 10*3/uL (ref 142–424)
RBC: 5.23 M/uL (ref 4.69–6.13)
RDW, POC: 13.7 %
WBC: 10.9 10*3/uL — AB (ref 4.6–10.2)

## 2014-10-25 LAB — POCT URINALYSIS DIPSTICK
Bilirubin, UA: NEGATIVE
Blood, UA: NEGATIVE
Glucose, UA: NEGATIVE
Ketones, UA: NEGATIVE
Leukocytes, UA: NEGATIVE
Nitrite, UA: NEGATIVE
Protein, UA: NEGATIVE
Spec Grav, UA: <=1.005
Urobilinogen, UA: 0.2
pH, UA: 5.5

## 2014-10-25 LAB — POCT UA - MICROSCOPIC ONLY
Bacteria, U Microscopic: NEGATIVE
Casts, Ur, LPF, POC: NEGATIVE
Crystals, Ur, HPF, POC: NEGATIVE
Mucus, UA: NEGATIVE
RBC, urine, microscopic: NEGATIVE
WBC, Ur, HPF, POC: NEGATIVE
Yeast, UA: NEGATIVE

## 2014-10-25 LAB — GLUCOSE, POCT (MANUAL RESULT ENTRY): POC Glucose: 86 mg/dl (ref 70–99)

## 2014-10-25 LAB — POCT GLYCOSYLATED HEMOGLOBIN (HGB A1C): Hemoglobin A1C: 5.8

## 2014-10-25 IMAGING — CT CT HEAD W/O CM
2 series · 16 of 30 positions shown, 20 images · non-contrast
Comparison: None.

CLINICAL DATA: Headache for 4 days. Nausea. Altered vision in the
right eye.

EXAM:
CT HEAD WITHOUT CONTRAST
TECHNIQUE: Contiguous axial images were obtained from the base of the skull
through the vertex without intravenous contrast.

[Series 2: head w/o · axial · non-contrast · 0.45mm/px · z∈[+1236,+1356]mm · 13 of 28 slices shown, 17 images]
[im 2/28  brain]
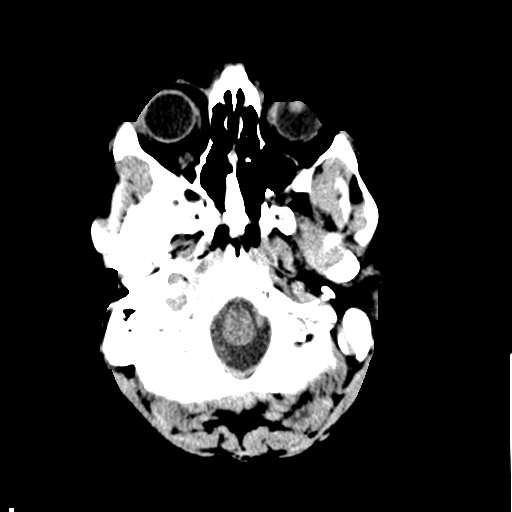
[im 2/28  bone]
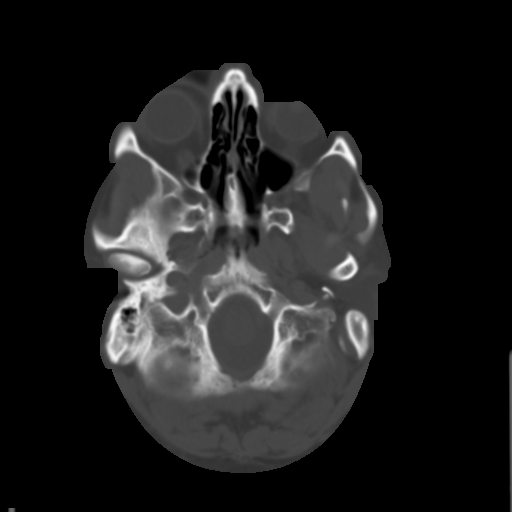
[im 4/28  brain]
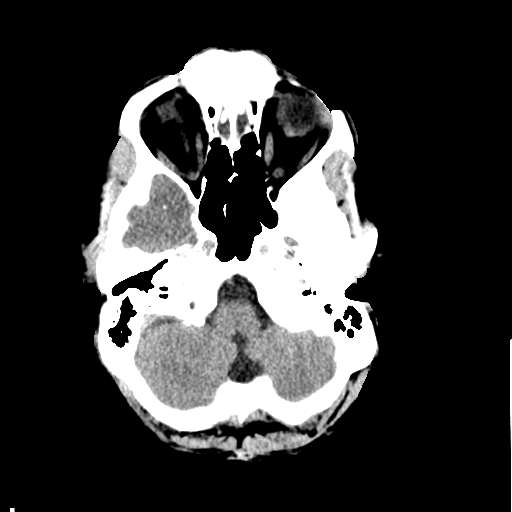
[im 6/28  brain]
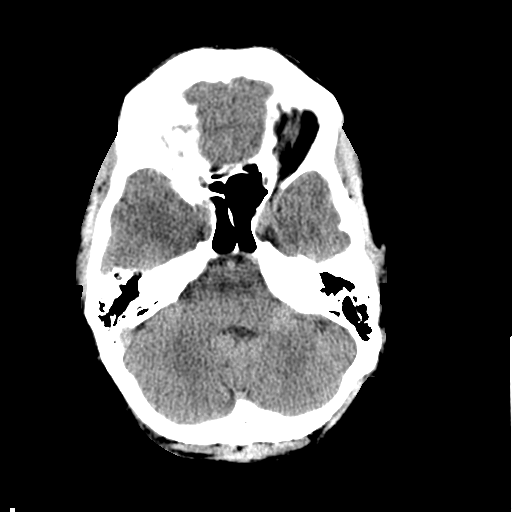
[im 8/28  brain]
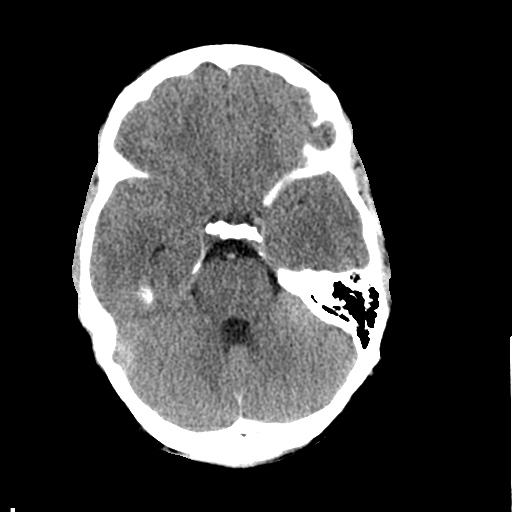
[im 10/28  brain]
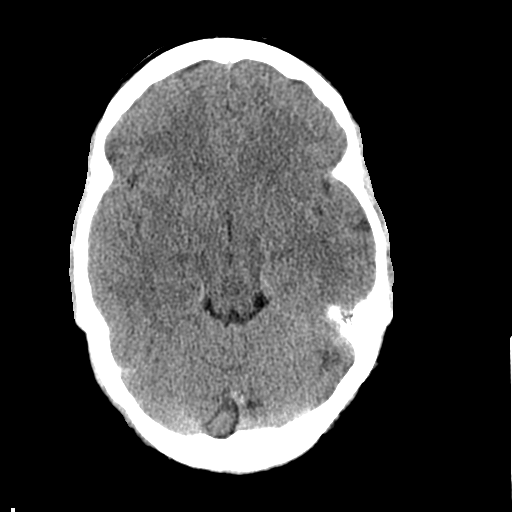
[im 10/28  bone]
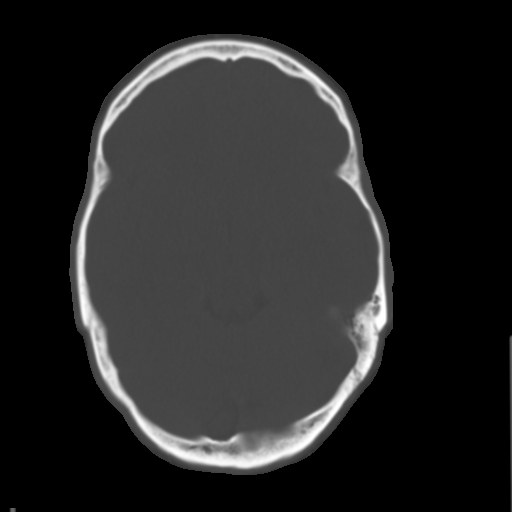
[im 12/28  brain]
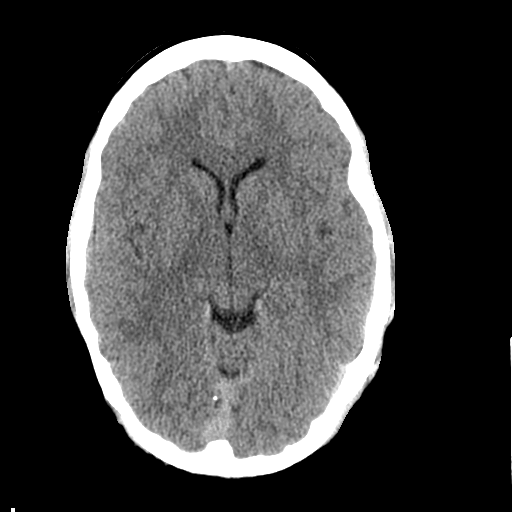
[im 14/28  brain]
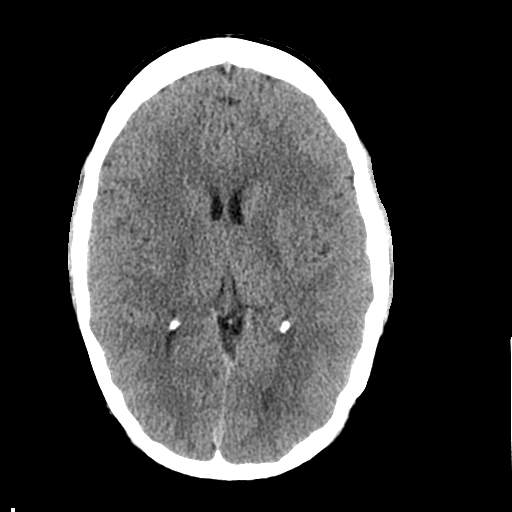
[im 16/28  brain]
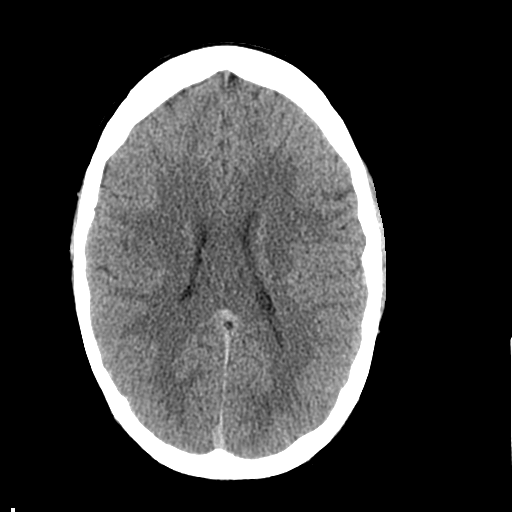
[im 18/28  brain]
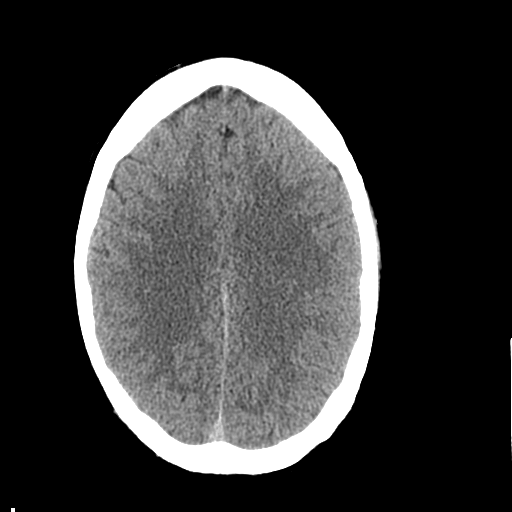
[im 18/28  bone]
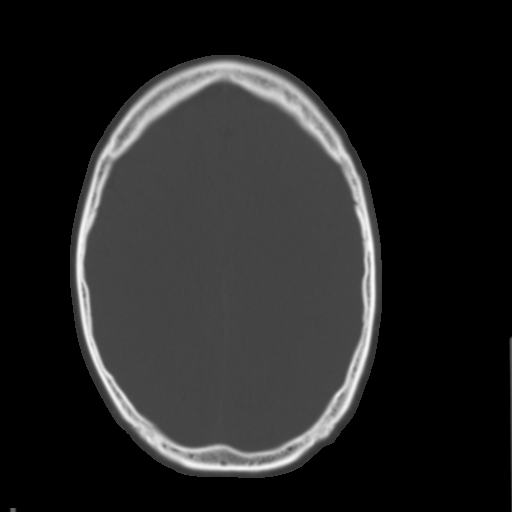
[im 20/28  brain]
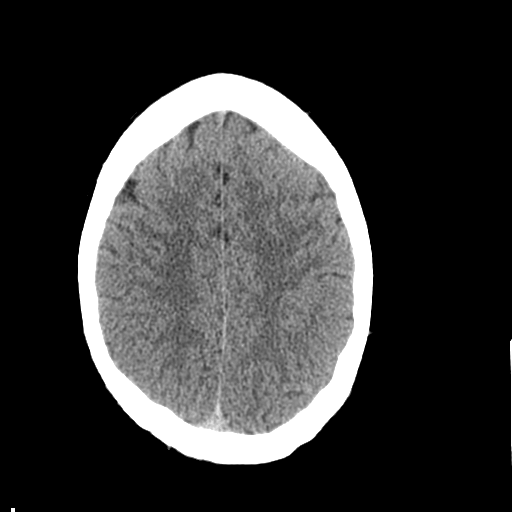
[im 22/28  brain]
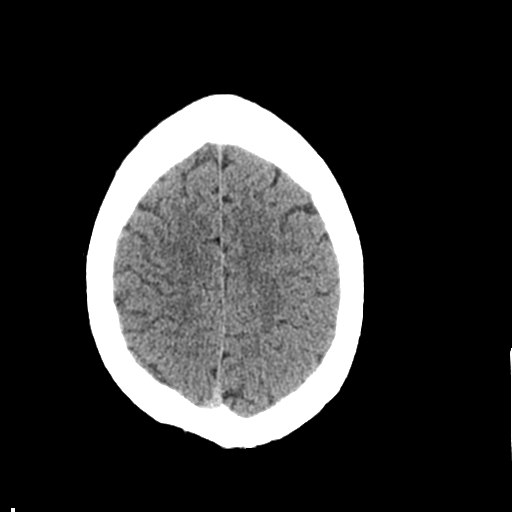
[im 24/28  brain]
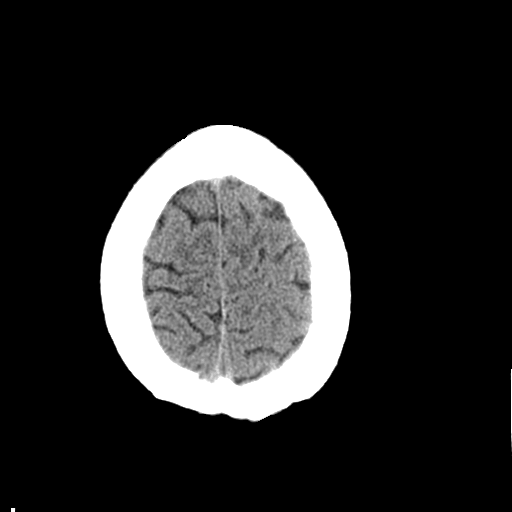
[im 26/28  brain]
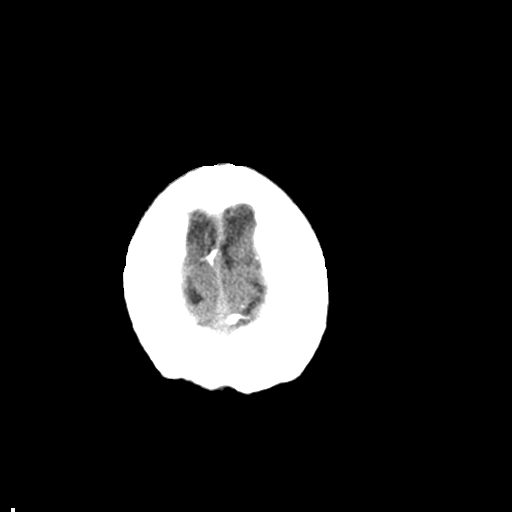
[im 26/28  bone]
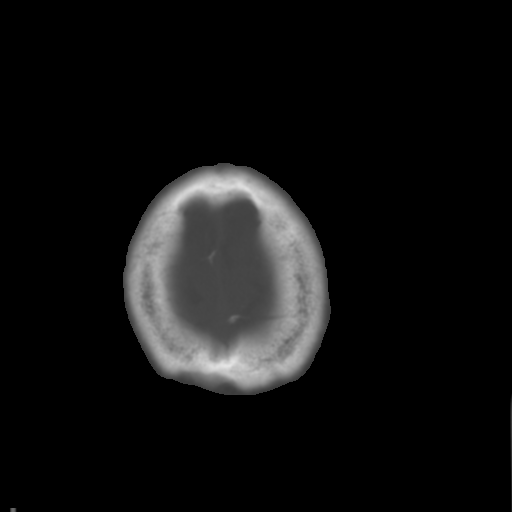

[Series 3: bone windows · axial · 0.45mm/px · z∈[+1236,+1276]mm · 3 of 28 slices shown]
[im 2/28  bone]
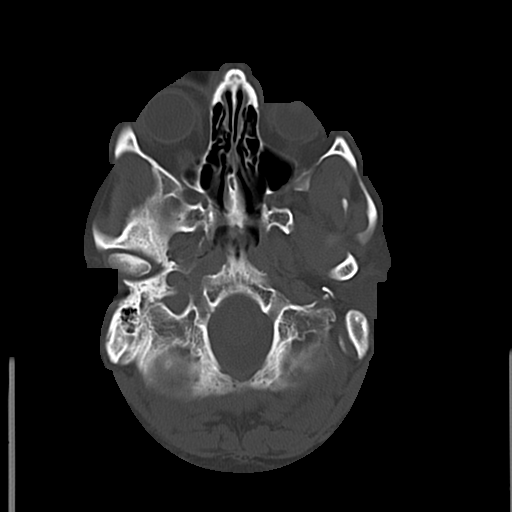
[im 6/28  bone]
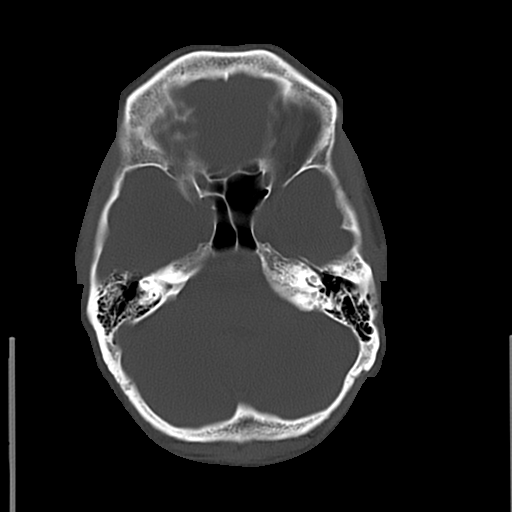
[im 10/28  bone]
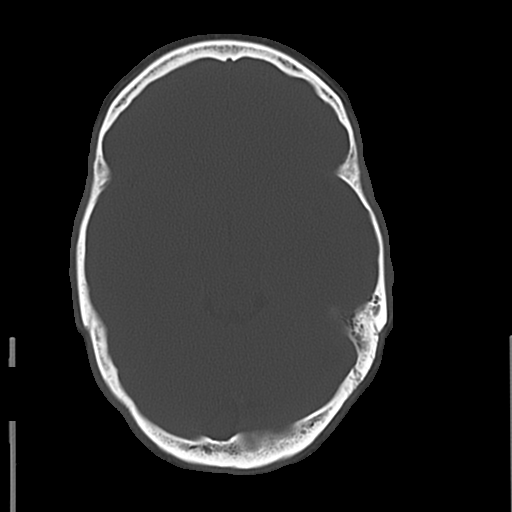

[16 of 30 positions shown; findings below may reference images not displayed]

FINDINGS: There is no intracranial hemorrhage, mass or evidence of acute
infarction. There is no extra-axial fluid collection. Gray matter
and white matter appear normal. Cerebral volume is normal for age.
Brainstem and posterior fossa are unremarkable. The CSF spaces
appear normal.

The bony structures are intact. The visible portions of the
paranasal sinuses are clear.
IMPRESSION: Normal brain

## 2014-10-25 NOTE — Progress Notes (Signed)
Chief Complaint:  Chief Complaint  Patient presents with  . Headache    x 4 days, positive for photophobia and phonophobia  . Nausea  . Loss of Consciousness    NEAR SYNCOPE per pt    HPI: Shane Cole. is a 34 y.o. male who reports to [facility] today complaining of a 4 day hsitory of a waxing and waning headache which started after he used the bathroom. He felt his neck get flushed and tingling and as he got closer to the bed he states he loss vision in both eyes. He was able to understand and was coherent but coul dnot see for a few seconds, he also felt the sounds around him were muffled and had ringing in his ears.  This "is the weirdest thing in his entire life. This is the worse HA he has had, he has had migraines before. He was kneeling on his bed because of the pain bilaterally and his vision was starry and black and neck and and arms bilaterally were tingling. He has been havong more frequent headaches, he felt like he was  floating about and had an out of  body experience. He feels "wierd" , Right now his headache is not bad, when it is sharp it is unbearable. No neck pain or rigidity. He could "see" his wife's lips from moving. He has taken tylenol, ibuprofen, he is on tramadol, and oxycodone, and is in pain management, on a couple of nerve medications and took all of that and he has had some relief but the HA is still present and more dull and persistent. He is not taking more meds than prescribed.  . Dr Shane Cole is his pain doctor . He has not missed any of his medicine, he has not taken his oxycodone in a few days but only when he needs it  So not in withdrawal. He can go without the oxycodone 1 week at a time . He has RSD in his left leg. Hypersensitivities nonunion.   No new medicine, he just recently stattrted on trazadone and has taken that before.  Since Sunday night and has been intermittent today is mild.  He has had some diarrhea, green stools. He has ahd his GB taken  out so has loose stools anyways. No fevers or chills, some nausea but no abd pain.    Past Medical History  Diagnosis Date  . Nerve pain   . Crush injury lower leg     Left lower leg  . Depression   . Migraine   . Bipolar disorder   . Allergy   . Anxiety   . Gastric ulcer   . GERD (gastroesophageal reflux disease)     will awaken him in night  . RSD (reflex sympathetic dystrophy)    Past Surgical History  Procedure Laterality Date  . Mass excision Left 01/25/2013    Procedure: EXCISION MASS DORSAL ASPECT LEFT LONG FINGER DISTAL INTERPHALANGEAL JOINT;  Surgeon: Shane Cole., MD;  Location: [facility];  Service: Orthopedics;  Laterality: Left;  Left long   . Mouth surgery    . Hand surgery    . Colonoscopy    . Cholecystectomy N/A 01/24/2014    Procedure: LAPAROSCOPIC CHOLECYSTECTOMY;  Surgeon: Shane Filler, MD;  Location: [facility] OR;  Service: General;  Laterality: N/A;   History   Social History  . Marital Status: Married    Spouse Name: N/A  . Number of Children: 1  .  Years of Education: 10th grade   Occupational History  . Disabled     Previously worked Web designer   Social History Main Topics  . Smoking status: Current Every Day Smoker -- 0.50 packs/day for 20 years    Types: Cigarettes  . Smokeless tobacco: Never Used  . Alcohol Use: 3.6 oz/week    6 Cans of beer per week     Comment: occasional  . Drug Use: No  . Sexual Activity:    Partners: Female   Other Topics Concern  . None   Social History Narrative   05/24/12  Shane Cole was born and grew up in [place], [place]. He has 2 sisters. He reports that his childhood was "lousy." He completed the 10th grade. He has been married for 5 years, and is currently separated for 3 weeks. He has a daughter who is 12-1/2 years old. He has been unemployed for 2 years, and is disabled due to an on-the-job work accident. He is currently living with his aunt and cousin. He reports that  his hobbies are sports and dogs. He reports that he is spiritual, but not religious. He states that his aunt and his father are his social support system. He denies any current legal problems, but got a DUI in 2007. 05/24/12 AHW         Family History  Problem Relation Age of Onset  . Hyperlipidemia Father   . Hypertension Father   . Anxiety disorder Father   . Drug abuse Father   . Cancer Paternal Grandfather     lung, colon  . Colon cancer Paternal Grandfather   . Lung cancer Paternal Grandfather   . Stroke    . Heart disease    . Diabetes Paternal Grandmother   . Cancer Paternal Grandmother   . Cancer Maternal Grandmother   . Cancer Maternal Grandfather   . Lung cancer Maternal Grandfather   . Depression Paternal Aunt   . Anxiety disorder Paternal Aunt   . Drug abuse Paternal Uncle    Allergies  Allergen Reactions  . Aspirin Other (See Comments)    Tinnitus, dizzy, short of breath  . Codeine Itching    Reaction to tylenol #3  . Penicillins Anaphylaxis  . Amitriptyline Rash   Prior to Admission medications   Medication Sig Start Date End Date Taking? Authorizing Provider  gabapentin (NEURONTIN) 600 MG tablet Take 1 tablet (600 mg total) by mouth 3 (three) times daily. 06/30/14  Yes Shane Colace, MD  lidocaine (XYLOCAINE) 5 % ointment Apply 1 application topically as needed. 10/12/14  Yes Shane Bales, NP  meloxicam (MOBIC) 7.5 MG tablet Take 1 tablet (7.5 mg total) by mouth daily. 10/12/14  Yes Shane Bales, NP  Multiple Vitamin (MULTIVITAMIN WITH MINERALS) TABS tablet Take 1 tablet by mouth daily.   Yes Historical Provider, MD  nortriptyline (PAMELOR) 50 MG capsule Take 1 capsule (50 mg total) by mouth at bedtime. 09/07/14  Yes Shane Colace, MD  oxyCODONE (OXY IR/ROXICODONE) 5 MG immediate release tablet Take 1 tablet AS NEEDED daily for severe pain 10/12/14  Yes Shane Bales, NP  pantoprazole (PROTONIX) 40 MG tablet Take 30 minutes before breakfast and  before supper. 02/13/14  Yes Shane Fee, MD  ranitidine (ZANTAC) 150 MG tablet Take 1 tablet (150 mg total) by mouth at bedtime. 12/30/13  Yes Shane Finerty P Elgin Carn, DO  traMADol (ULTRAM) 50 MG tablet Take 1 tablet (50 mg total) by mouth  every 12 (twelve) hours as needed. 06/30/14  Yes Shane Colace, MD  traZODone (DESYREL) 50 MG tablet Take 0.5-1 tablets (25-50 mg total) by mouth at bedtime as needed for sleep. 10/12/14  Yes Shane Bales, NP     ROS: The patient denies fevers, chills, night sweats, unintentional weight loss, chest pain, palpitations, wheezing, dyspnea on exertion, vomiting, abdominal pain, dysuria, hematuria, melena  All other systems have been reviewed and were otherwise negative with the exception of those mentioned in the HPI and as above.    PHYSICAL EXAM: Filed Vitals:   10/25/14 1923  BP: 126/72  Pulse: 98  Temp: 98.1 F (36.7 C)  Resp: 14   BP Readings from Last 3 Encounters:  10/25/14 126/72  10/12/14 143/93  09/30/14 100/62   SpO2 Readings from Last 3 Encounters:  10/25/14 98%  10/12/14 98%  09/30/14 96%    Body mass index is 32.97 kg/(m^2).   General: Alert, no acute distress HEENT:  Normocephalic, atraumatic, oropharynx patent. EOMI, PERRLA, no dentition, no thyroidmegaly, fundoscopic exam normal Cardiovascular:  Regular rate and rhythm, no rubs murmurs or gallops.  No Carotid bruits, radial pulse intact. No pedal edema.  Respiratory: Clear to auscultation bilaterally.  No wheezes, rales, or rhonchi.  No cyanosis, no use of accessory musculature Abdominal: No organomegaly, abdomen is soft and non-tender, positive bowel sounds. No masses. Skin: No rashes. Slightly flushed Neurologic: Facial musculature symmetric. CN 2-12 grossly normal Psychiatric: Patient acts appropriately throughout our interaction. Lymphatic: No cervical lymphadenopathy Musculoskeletal: Gait intact. No edema, tenderness 5/5  Strength on right leg but 4+ on left leg, hestates  that is normal for him 2/2 DTRs UE and Lakenzie Mcclafferty Romeberg neg, heel to toe neg, finger to nose neg   LABS: Results for orders placed or performed in visit on 10/25/14  POCT CBC  Result Value Ref Range   WBC 10.9 (A) 4.6 - 10.2 K/uL   Lymph, poc 3.0 0.6 - 3.4   POC LYMPH PERCENT 27.4 10 - 50 %L   MID (cbc) 0.5 0 - 0.9   POC MID % 5.0 0 - 12 %M   POC Granulocyte 7.4 (A) 2 - 6.9   Granulocyte percent 67.6 37 - 80 %G   RBC 5.23 4.69 - 6.13 M/uL   Hemoglobin 15.2 14.1 - 18.1 g/dL   HCT, POC 16.1 09.6 - 53.7 %   MCV 90.7 80 - 97 fL   MCH, POC 29.1 27 - 31.2 pg   MCHC 32.1 31.8 - 35.4 g/dL   RDW, POC 04.5 %   Platelet Count, POC 247 142 - 424 K/uL   MPV 6.8 0 - 99.8 fL  POCT urinalysis dipstick  Result Value Ref Range   Color, UA light yellow    Clarity, UA clear    Glucose, UA neg    Bilirubin, UA neg    Ketones, UA neg    Spec Grav, UA <=1.005    Blood, UA neg    pH, UA 5.5    Protein, UA neg    Urobilinogen, UA 0.2    Nitrite, UA neg    Leukocytes, UA Negative Negative  POCT UA - Microscopic Only  Result Value Ref Range   WBC, Ur, HPF, POC neg    RBC, urine, microscopic neg    Bacteria, U Microscopic neg    Mucus, UA neg    Epithelial cells, urine per micros 0-2    Crystals, Ur, HPF, POC neg    Casts, Ur,  LPF, POC neg    Yeast, UA neg   POCT glucose (manual entry)  Result Value Ref Range   POC Glucose 86 70 - 99 mg/dl  POCT glycosylated hemoglobin (Hb A1C)  Result Value Ref Range   Hemoglobin A1C 5.8      EKG/XRAY:   Primary read interpreted by Dr. Conley Rolls at [facility].   ASSESSMENT/PLAN: Encounter Diagnoses  Name Primary?  . Other type of migraine Yes  . Non-intractable vomiting with nausea, vomiting of unspecified type   . Numbness and tingling   . Acute loss of vision, bilateral   . Opioid contract exists   . Chronic narcotic use     Mr Alfredia Ferguson is a 34 y/o white male with a PMH of  Lower limb RSD, chronic pain syndrome due to a crush injury, migraine HAs,  GERD, depression/bipolar who presents to the office with a 4 day history of diffuse migraine headache associated with acute visual loss, nausea, numbness and tingling in his neck and arms bilaterally. He is currently having less pain and the HA is more localized in his right frontal lobe and right eye.  He declines to the ER for further evaluation and IVF, prefers to do this outpatient Neurological exam is stable I would like him to get a stat CT scan since he had acute loss of vision bilaterally. This could be just an atypical migraine. However we will need to make sure that there is no aneurysm versus TIA.  Follow-up tomorrow for IVF which I think will make this better. He does not appear to be be having an OD on his narcotic meds so no need for narcan.  Labs pending. Urine drug screen pending. Advised to go the ER when necessary otherwise get a stat CT today, go directly to [facility] for his CT scan  Gross sideeffects, risk and benefits, and alternatives of medications d/w patient. Patient is aware that all medications have potential sideeffects and we are unable to predict every sideeffect or drug-drug interaction that may occur.  Velma Hanna DO  10/25/2014 8:43 PM

## 2014-10-25 NOTE — Patient Instructions (Addendum)
Go to [facility] and register at the Emergency Department for OUTPATIENT CT. DO NOT REGISTER as ED patient.      Migraine Headache A migraine headache is an intense, throbbing pain on one or both sides of your head. A migraine can last for 30 minutes to several hours. CAUSES  The exact cause of a migraine headache is not always known. However, a migraine may be caused when nerves in the brain become irritated and release chemicals that cause inflammation. This causes pain. Certain things may also trigger migraines, such as:  Alcohol.  Smoking.  Stress.  Menstruation.  Aged cheeses.  Foods or drinks that contain nitrates, glutamate, aspartame, or tyramine.  Lack of sleep.  Chocolate.  Caffeine.  Hunger.  Physical exertion.  Fatigue.  Medicines used to treat chest pain (nitroglycerine), birth control pills, estrogen, and some blood pressure medicines. SIGNS AND SYMPTOMS  Pain on one or both sides of your head.  Pulsating or throbbing pain.  Severe pain that prevents daily activities.  Pain that is aggravated by any physical activity.  Nausea, vomiting, or both.  Dizziness.  Pain with exposure to bright lights, loud noises, or activity.  General sensitivity to bright lights, loud noises, or smells. Before you get a migraine, you may get warning signs that a migraine is coming (aura). An aura may include:  Seeing flashing lights.  Seeing bright spots, halos, or zigzag lines.  Having tunnel vision or blurred vision.  Having feelings of numbness or tingling.  Having trouble talking.  Having muscle weakness. DIAGNOSIS  A migraine headache is often diagnosed based on:  Symptoms.  Physical exam.  A CT scan or MRI of your head. These imaging tests cannot diagnose migraines, but they can help rule out other causes of headaches. TREATMENT Medicines may be given for pain and nausea. Medicines can also be given to help prevent recurrent  migraines.  HOME CARE INSTRUCTIONS  Only take over-the-counter or prescription medicines for pain or discomfort as directed by your health care provider. The use of long-term narcotics is not recommended.  Lie down in a dark, quiet room when you have a migraine.  Keep a journal to find out what may trigger your migraine headaches. For example, write down:  What you eat and drink.  How much sleep you get.  Any change to your diet or medicines.  Limit alcohol consumption.  Quit smoking if you smoke.  Get 7-9 hours of sleep, or as recommended by your health care provider.  Limit stress.  Keep lights dim if bright lights bother you and make your migraines worse. SEEK IMMEDIATE MEDICAL CARE IF:   Your migraine becomes severe.  You have a fever.  You have a stiff neck.  You have vision loss.  You have muscular weakness or loss of muscle control.  You start losing your balance or have trouble walking.  You feel faint or pass out.  You have severe symptoms that are different from your first symptoms. MAKE SURE YOU:   Understand these instructions.  Will watch your condition.  Will get help right away if you are not doing well or get worse. Document Released: 03/10/2005 Document Revised: 07/25/2013 Document Reviewed: 11/15/2012 [facility] Patient Information 2015 [place], [place]. This information is not intended to replace advice given to you by your health care provider. Make sure you discuss any questions you have with your health care provider.

## 2014-10-26 ENCOUNTER — Telehealth: Payer: Self-pay | Admitting: Family Medicine

## 2014-10-26 ENCOUNTER — Telehealth: Payer: Self-pay | Admitting: *Deleted

## 2014-10-26 ENCOUNTER — Ambulatory Visit (INDEPENDENT_AMBULATORY_CARE_PROVIDER_SITE_OTHER): Payer: 59 | Admitting: Family Medicine

## 2014-10-26 VITALS — BP 132/80 | HR 92 | Temp 98.4°F | Resp 20 | Ht 69.5 in | Wt 223.4 lb

## 2014-10-26 DIAGNOSIS — G43919 Migraine, unspecified, intractable, without status migrainosus: Secondary | ICD-10-CM

## 2014-10-26 DIAGNOSIS — D72829 Elevated white blood cell count, unspecified: Secondary | ICD-10-CM | POA: Diagnosis not present

## 2014-10-26 DIAGNOSIS — R110 Nausea: Secondary | ICD-10-CM

## 2014-10-26 DIAGNOSIS — Z79891 Long term (current) use of opiate analgesic: Secondary | ICD-10-CM | POA: Insufficient documentation

## 2014-10-26 DIAGNOSIS — F1190 Opioid use, unspecified, uncomplicated: Secondary | ICD-10-CM | POA: Insufficient documentation

## 2014-10-26 LAB — COMPREHENSIVE METABOLIC PANEL WITH GFR
CO2: 26 mmol/L (ref 20–31)
Calcium: 9.8 mg/dL (ref 8.6–10.3)
Glucose, Bld: 91 mg/dL (ref 65–99)
Potassium: 4.4 mmol/L (ref 3.5–5.3)

## 2014-10-26 LAB — POCT CBC
Granulocyte percent: 68.6 %G (ref 37–80)
HCT, POC: 47.2 % (ref 43.5–53.7)
Hemoglobin: 15.4 g/dL (ref 14.1–18.1)
Lymph, poc: 2 (ref 0.6–3.4)
MCH, POC: 29.2 pg (ref 27–31.2)
MCHC: 32.6 g/dL (ref 31.8–35.4)
MCV: 89.5 fL (ref 80–97)
MID (cbc): 0.3 (ref 0–0.9)
MPV: 6.9 fL (ref 0–99.8)
POC Granulocyte: 5.1 (ref 2–6.9)
POC LYMPH PERCENT: 27.3 %L (ref 10–50)
POC MID %: 4.1 %M (ref 0–12)
Platelet Count, POC: 242 10*3/uL (ref 142–424)
RBC: 5.27 M/uL (ref 4.69–6.13)
RDW, POC: 13 %
WBC: 7.4 10*3/uL (ref 4.6–10.2)

## 2014-10-26 LAB — COMPREHENSIVE METABOLIC PANEL
ALT: 57 U/L — ABNORMAL HIGH (ref 9–46)
AST: 24 U/L (ref 10–40)
Albumin: 4.8 g/dL (ref 3.6–5.1)
Alkaline Phosphatase: 77 U/L (ref 40–115)
BUN: 16 mg/dL (ref 7–25)
Chloride: 103 mmol/L (ref 98–110)
Creat: 0.95 mg/dL (ref 0.60–1.35)
Sodium: 139 mmol/L (ref 135–146)
Total Bilirubin: 0.4 mg/dL (ref 0.2–1.2)
Total Protein: 7.4 g/dL (ref 6.1–8.1)

## 2014-10-26 LAB — TSH: TSH: 5.177 u[IU]/mL — ABNORMAL HIGH (ref 0.350–4.500)

## 2014-10-26 MED ORDER — KETOROLAC TROMETHAMINE 30 MG/ML IJ SOLN
30.0000 mg | Freq: Once | INTRAMUSCULAR | Status: AC
  Administered 2014-10-26: 30 mg via INTRAMUSCULAR

## 2014-10-26 MED ORDER — ONDANSETRON 4 MG PO TBDP
4.0000 mg | ORAL_TABLET | Freq: Three times a day (TID) | ORAL | Status: DC | PRN
Start: 2014-10-26 — End: 2015-06-04

## 2014-10-26 MED ORDER — SUMATRIPTAN SUCCINATE 25 MG PO TABS: 25.0000 mg | ORAL_TABLET | ORAL | Status: DC | PRN

## 2014-10-26 MED ORDER — TRAMADOL HCL 50 MG PO TABS: 50.0000 mg | ORAL_TABLET | Freq: Two times a day (BID) | ORAL | Status: DC | PRN

## 2014-10-26 MED ORDER — ONDANSETRON 4 MG PO TBDP
4.0000 mg | ORAL_TABLET | Freq: Once | ORAL | Status: AC
  Administered 2014-10-26: 4 mg via ORAL

## 2014-10-26 MED ORDER — KETOROLAC TROMETHAMINE 60 MG/2ML IM SOLN: 60.0000 mg | Freq: Once | INTRAMUSCULAR | Status: DC

## 2014-10-26 NOTE — Telephone Encounter (Signed)
Refill tramadol.  Order called to pharmacy and [facility] notified.

## 2014-10-26 NOTE — Patient Instructions (Signed)

## 2014-10-26 NOTE — Telephone Encounter (Signed)
LM that CT scan normal, recheck today in office

## 2014-10-26 NOTE — Progress Notes (Signed)
Chief Complaint:  Chief Complaint  Patient presents with  . Headache    still has a headache, light hurts his eyes, loud noises bother him, nausea--seen last pm for the same    HPI: Shane Cole. is a 34 y.o. male who reports to [facility] today complaining of headache, it is better and but can be sometimes still bad, sharp behind his right eye, he has not eaten anything, he has been nauseated, threw up twice last night, He is traking tramadol BID oxycodone 5 mg IR once a day, ibuprofen 800 mg TID. He has been on this for a while. He does not have any ringing in his ears right now, he feels the sound is muffled. He took a gabapentin TID and Nortyrptyline daily at night, trazadone 1/2-1 tb at night, protonix BID. HE also takes ranitidine once a day. He also uses lidoderm cream.  He still has light sensitivity. No fevers chill or CP, palpitations or  SOB.   Last night he had labs done and also a head CT without contrast.   CLINICAL DATA: Headache for 4 days. Nausea. Altered vision in the right eye.  EXAM: CT HEAD WITHOUT CONTRAST  TECHNIQUE: Contiguous axial images were obtained from the base of the skull through the vertex without intravenous contrast.  COMPARISON: None.  FINDINGS: There is no intracranial hemorrhage, mass or evidence of acute infarction. There is no extra-axial fluid collection. Gray matter and white matter appear normal. Cerebral volume is normal for age. Brainstem and posterior fossa are unremarkable. The CSF spaces appear normal.  The bony structures are intact. The visible portions of the paranasal sinuses are clear.  IMPRESSION: Normal brain   Electronically Signed  By: Ellery Plunk M.D.  On: 10/25/2014 23:54   Past Medical History  Diagnosis Date  . Nerve pain   . Crush injury lower leg     Left lower leg  . Depression   . Migraine   . Bipolar disorder   . Allergy   . Anxiety   . Gastric ulcer   . GERD  (gastroesophageal reflux disease)     will awaken him in night  . RSD (reflex sympathetic dystrophy)    Past Surgical History  Procedure Laterality Date  . Mass excision Left 01/25/2013    Procedure: EXCISION MASS DORSAL ASPECT LEFT LONG FINGER DISTAL INTERPHALANGEAL JOINT;  Surgeon: Wyn Forster., MD;  Location: [facility];  Service: Orthopedics;  Laterality: Left;  Left long   . Mouth surgery    . Hand surgery    . Colonoscopy    . Cholecystectomy N/A 01/24/2014    Procedure: LAPAROSCOPIC CHOLECYSTECTOMY;  Surgeon: Axel Filler, MD;  Location: [facility] OR;  Service: General;  Laterality: N/A;   History   Social History  . Marital Status: Married    Spouse Name: N/A  . Number of Children: 1  . Years of Education: 10th grade   Occupational History  . Disabled     Previously worked Web designer   Social History Main Topics  . Smoking status: Current Every Day Smoker -- 0.50 packs/day for 20 years    Types: Cigarettes  . Smokeless tobacco: Never Used  . Alcohol Use: 3.6 oz/week    6 Cans of beer per week     Comment: occasional  . Drug Use: No  . Sexual Activity:    Partners: Female   Other Topics Concern  . None  Social History Narrative   05/24/12  Shane Cole was born and grew up in [place], [place]. He has 2 sisters. He reports that his childhood was "lousy." He completed the 10th grade. He has been married for 5 years, and is currently separated for 3 weeks. He has a daughter who is 52-1/2 years old. He has been unemployed for 2 years, and is disabled due to an on-the-job work accident. He is currently living with his aunt and cousin. He reports that his hobbies are sports and dogs. He reports that he is spiritual, but not religious. He states that his aunt and his father are his social support system. He denies any current legal problems, but got a DUI in 2007. 05/24/12 AHW         Family History  Problem Relation Age of Onset  .  Hyperlipidemia Father   . Hypertension Father   . Anxiety disorder Father   . Drug abuse Father   . Cancer Paternal Grandfather     lung, colon  . Colon cancer Paternal Grandfather   . Lung cancer Paternal Grandfather   . Stroke    . Heart disease    . Diabetes Paternal Grandmother   . Cancer Paternal Grandmother   . Cancer Maternal Grandmother   . Cancer Maternal Grandfather   . Lung cancer Maternal Grandfather   . Depression Paternal Aunt   . Anxiety disorder Paternal Aunt   . Drug abuse Paternal Uncle    Allergies  Allergen Reactions  . Aspirin Other (See Comments)    Tinnitus, dizzy, short of breath  . Codeine Itching    Reaction to tylenol #3  . Penicillins Anaphylaxis  . Amitriptyline Rash   Prior to Admission medications   Medication Sig Start Date End Date Taking? Authorizing Provider  gabapentin (NEURONTIN) 600 MG tablet Take 1 tablet (600 mg total) by mouth 3 (three) times daily. 06/30/14  Yes Erick Colace, MD  lidocaine (XYLOCAINE) 5 % ointment Apply 1 application topically as needed. 10/12/14  Yes Jones Bales, NP  meloxicam (MOBIC) 7.5 MG tablet Take 1 tablet (7.5 mg total) by mouth daily. 10/12/14  Yes Jones Bales, NP  Multiple Vitamin (MULTIVITAMIN WITH MINERALS) TABS tablet Take 1 tablet by mouth daily.   Yes Historical Provider, MD  nortriptyline (PAMELOR) 50 MG capsule Take 1 capsule (50 mg total) by mouth at bedtime. 09/07/14  Yes Erick Colace, MD  oxyCODONE (OXY IR/ROXICODONE) 5 MG immediate release tablet Take 1 tablet AS NEEDED daily for severe pain 10/12/14  Yes Jones Bales, NP  pantoprazole (PROTONIX) 40 MG tablet Take 30 minutes before breakfast and before supper. 02/13/14  Yes Rachael Fee, MD  ranitidine (ZANTAC) 150 MG tablet Take 1 tablet (150 mg total) by mouth at bedtime. 12/30/13  Yes Alazar Cherian P Harce Volden, DO  traMADol (ULTRAM) 50 MG tablet Take 1 tablet (50 mg total) by mouth every 12 (twelve) hours as needed. 10/26/14  Yes Jones Bales,  NP  traZODone (DESYREL) 50 MG tablet Take 0.5-1 tablets (25-50 mg total) by mouth at bedtime as needed for sleep. 10/12/14  Yes Jones Bales, NP     ROS: The patient denies fevers, chills, night sweats, unintentional weight loss, chest pain, palpitations, wheezing, dyspnea on exertion,  vomiting, abdominal pain, dysuria, hematuria, melena,   All other systems have been reviewed and were otherwise negative with the exception of those mentioned in the HPI and as above.    PHYSICAL EXAM:  Filed Vitals:   10/26/14 1737  BP: 132/80  Pulse: 92  Temp: 98.4 F (36.9 C)  Resp: 20   Body mass index is 32.52 kg/(m^2).   General: Alert, no acute distress HEENT:  Normocephalic, atraumatic, oropharynx patent. EOMI, PERRLA, fundoscopic exam normal, neg nystagmus Cardiovascular:  Regular rate and rhythm, no rubs murmurs or gallops.  No Carotid bruits, radial pulse intact. No pedal edema.  Respiratory: Clear to auscultation bilaterally.  No wheezes, rales, or rhonchi.  No cyanosis, no use of accessory musculature Abdominal: No organomegaly, abdomen is soft and non-tender, positive bowel sounds. No masses. Skin: No rashes. Neurologic: Facial musculature symmetric. 4/5 strength left Shane Cole , 2/2 DTRs Psychiatric: Patient acts appropriately throughout our interaction. Lymphatic: No cervical lymphadenopathy Musculoskeletal: Gait intact. No edema, tenderness   LABS: Results for orders placed or performed in visit on 10/26/14  POCT CBC  Result Value Ref Range   WBC 7.4 4.6 - 10.2 K/uL   Lymph, poc 2.0 0.6 - 3.4   POC LYMPH PERCENT 27.3 10 - 50 %L   MID (cbc) 0.3 0 - 0.9   POC MID % 4.1 0 - 12 %M   POC Granulocyte 5.1 2 - 6.9   Granulocyte percent 68.6 37 - 80 %G   RBC 5.27 4.69 - 6.13 M/uL   Hemoglobin 15.4 14.1 - 18.1 g/dL   HCT, POC 40.9 81.1 - 53.7 %   MCV 89.5 80 - 97 fL   MCH, POC 29.2 27 - 31.2 pg   MCHC 32.6 31.8 - 35.4 g/dL   RDW, POC 91.4 %   Platelet Count, POC 242 142 - 424 K/uL     MPV 6.9 0 - 99.8 fL     EKG/XRAY:   Primary read interpreted by Dr. Conley Rolls at [facility].   ASSESSMENT/PLAN: Encounter Diagnoses  Name Primary?  . Nausea without vomiting Yes  . Leukocytosis   . Intractable migraine without status migrainosus, unspecified migraine type    Feels better after 2L  IVF Rx Imitrex and zofran Refer to neuorlogy for migraine headaches Precautions given  Gross sideeffects, risk and benefits, and alternatives of medications d/w patient. Patient is aware that all medications have potential sideeffects and we are unable to predict every sideeffect or drug-drug interaction that may occur.  Nadirah Socorro DO  10/26/2014 7:05 PM

## 2014-10-31 LAB — OPIATES/OPIOIDS (LC/MS-MS)
Codeine Urine: NEGATIVE ng/mL (ref <50)
Hydrocodone: 228 ng/mL — AB (ref <50)
Hydromorphone: NEGATIVE ng/mL (ref <50)
Morphine Urine: NEGATIVE ng/mL (ref <50)
Norhydrocodone, Ur: 233 ng/mL — AB (ref <50)
Noroxycodone, Ur: NEGATIVE ng/mL (ref <50)
Oxycodone, ur: NEGATIVE ng/mL (ref <50)
Oxymorphone: NEGATIVE ng/mL (ref <50)

## 2014-11-01 LAB — PRESCRIPTION MONITORING PROFILE (10 PANEL)
Amphetamine/Meth: NEGATIVE ng/mL
Barbiturate Screen, Urine: NEGATIVE ng/mL
Benzodiazepine Screen, Urine: NEGATIVE ng/mL
Buprenorphine, Urine: NEGATIVE ng/mL
Cannabinoid Scrn, Ur: NEGATIVE ng/mL
Cocaine Metabolites: NEGATIVE ng/mL
Creatinine, Urine: 13.42 mg/dL — ABNORMAL LOW (ref >20.0)
Methadone Screen, Urine: NEGATIVE ng/mL
Nitrites, Initial: NEGATIVE ug/mL
Oxycodone Screen, Ur: NEGATIVE ng/mL
Propoxyphene: NEGATIVE ng/mL
pH, Initial: 5.3 pH (ref 4.5–8.9)

## 2014-11-07 ENCOUNTER — Encounter: Payer: 59 | Admitting: Registered Nurse

## 2014-11-13 ENCOUNTER — Ambulatory Visit: Payer: 59 | Admitting: Physical Medicine & Rehabilitation

## 2014-11-24 ENCOUNTER — Encounter: Payer: Self-pay | Admitting: Family Medicine

## 2014-12-01 ENCOUNTER — Other Ambulatory Visit: Payer: Self-pay | Admitting: Registered Nurse

## 2014-12-05 ENCOUNTER — Ambulatory Visit (INDEPENDENT_AMBULATORY_CARE_PROVIDER_SITE_OTHER): Payer: 59 | Admitting: Family Medicine

## 2014-12-05 ENCOUNTER — Ambulatory Visit (INDEPENDENT_AMBULATORY_CARE_PROVIDER_SITE_OTHER): Payer: 59

## 2014-12-05 VITALS — BP 126/80 | HR 114 | Temp 98.4°F | Resp 17 | Ht 70.0 in | Wt 227.0 lb

## 2014-12-05 DIAGNOSIS — T148 Other injury of unspecified body region: Secondary | ICD-10-CM | POA: Diagnosis not present

## 2014-12-05 DIAGNOSIS — M25571 Pain in right ankle and joints of right foot: Secondary | ICD-10-CM

## 2014-12-05 DIAGNOSIS — E039 Hypothyroidism, unspecified: Secondary | ICD-10-CM | POA: Diagnosis not present

## 2014-12-05 DIAGNOSIS — T148XXA Other injury of unspecified body region, initial encounter: Secondary | ICD-10-CM

## 2014-12-05 IMAGING — CR DG FOOT COMPLETE 3+V*R*
3 series · 3 of 3 positions shown · non-contrast
Comparison: Ankle films, dictated separately.

CLINICAL DATA: Twisting injury with pain. Initial encounter.

EXAM:
RIGHT FOOT COMPLETE - 3+ VIEW

[AP]
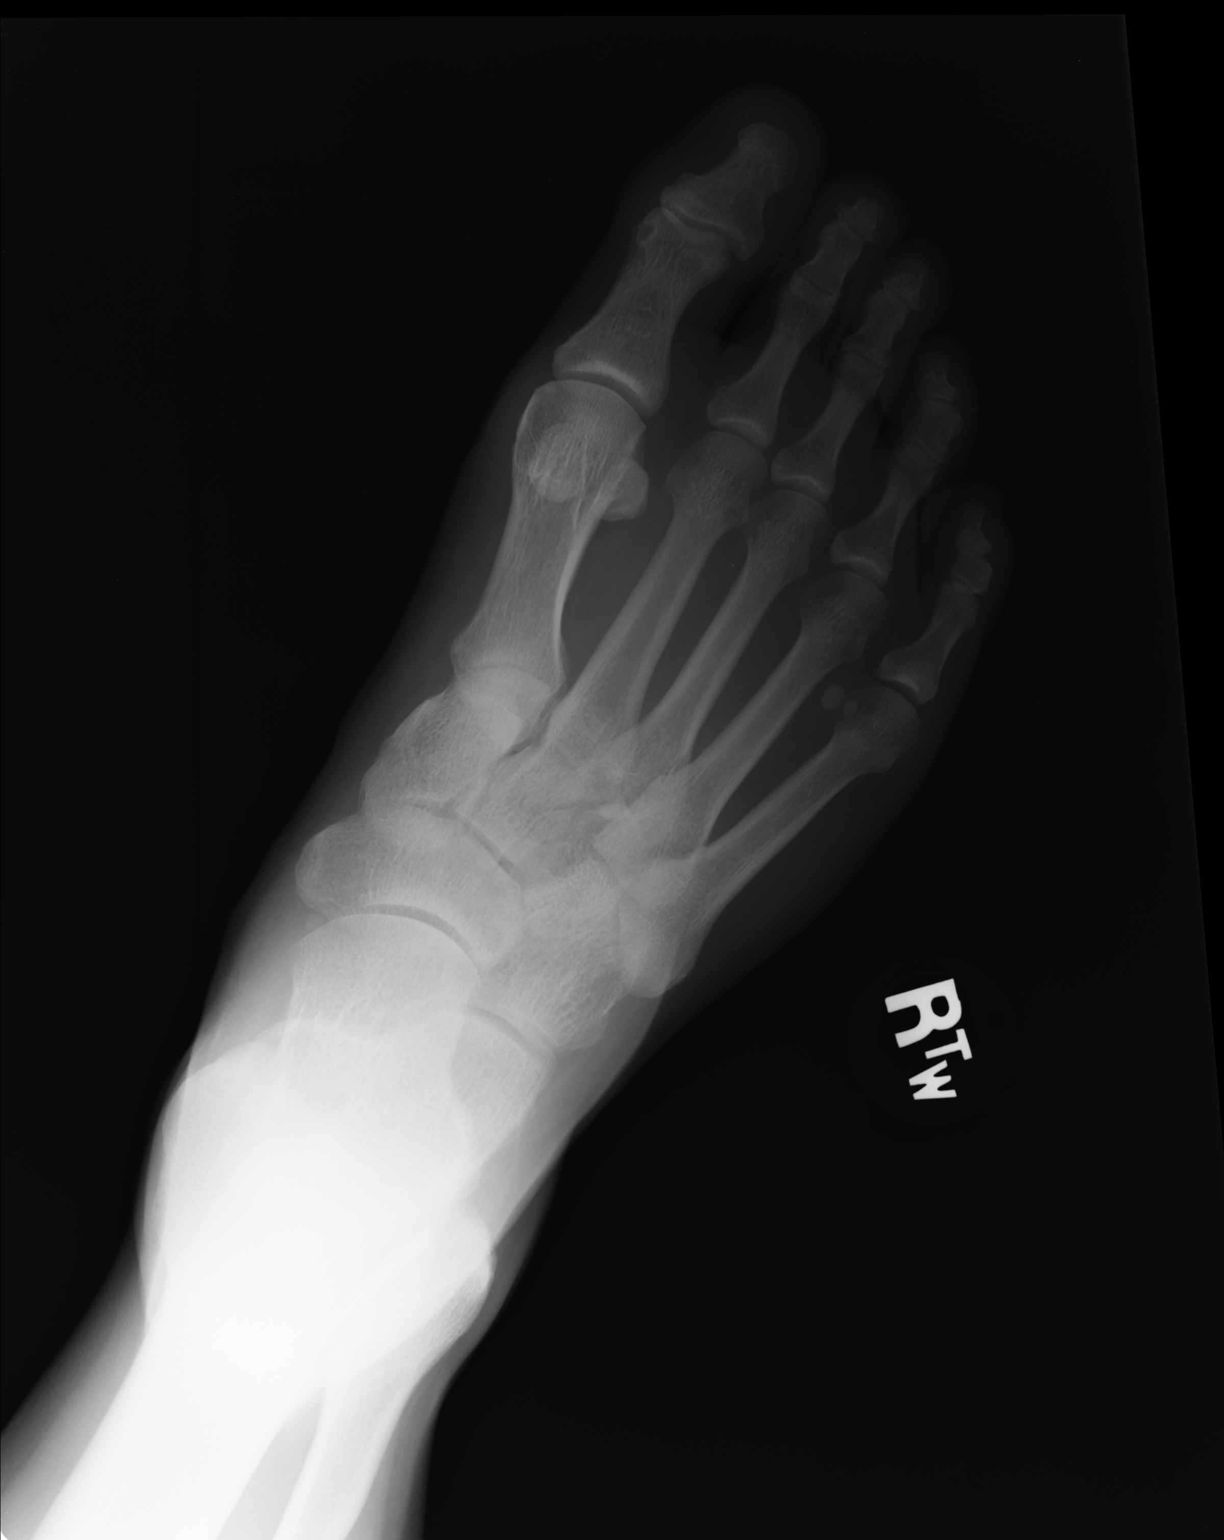

[ap obl int rot]
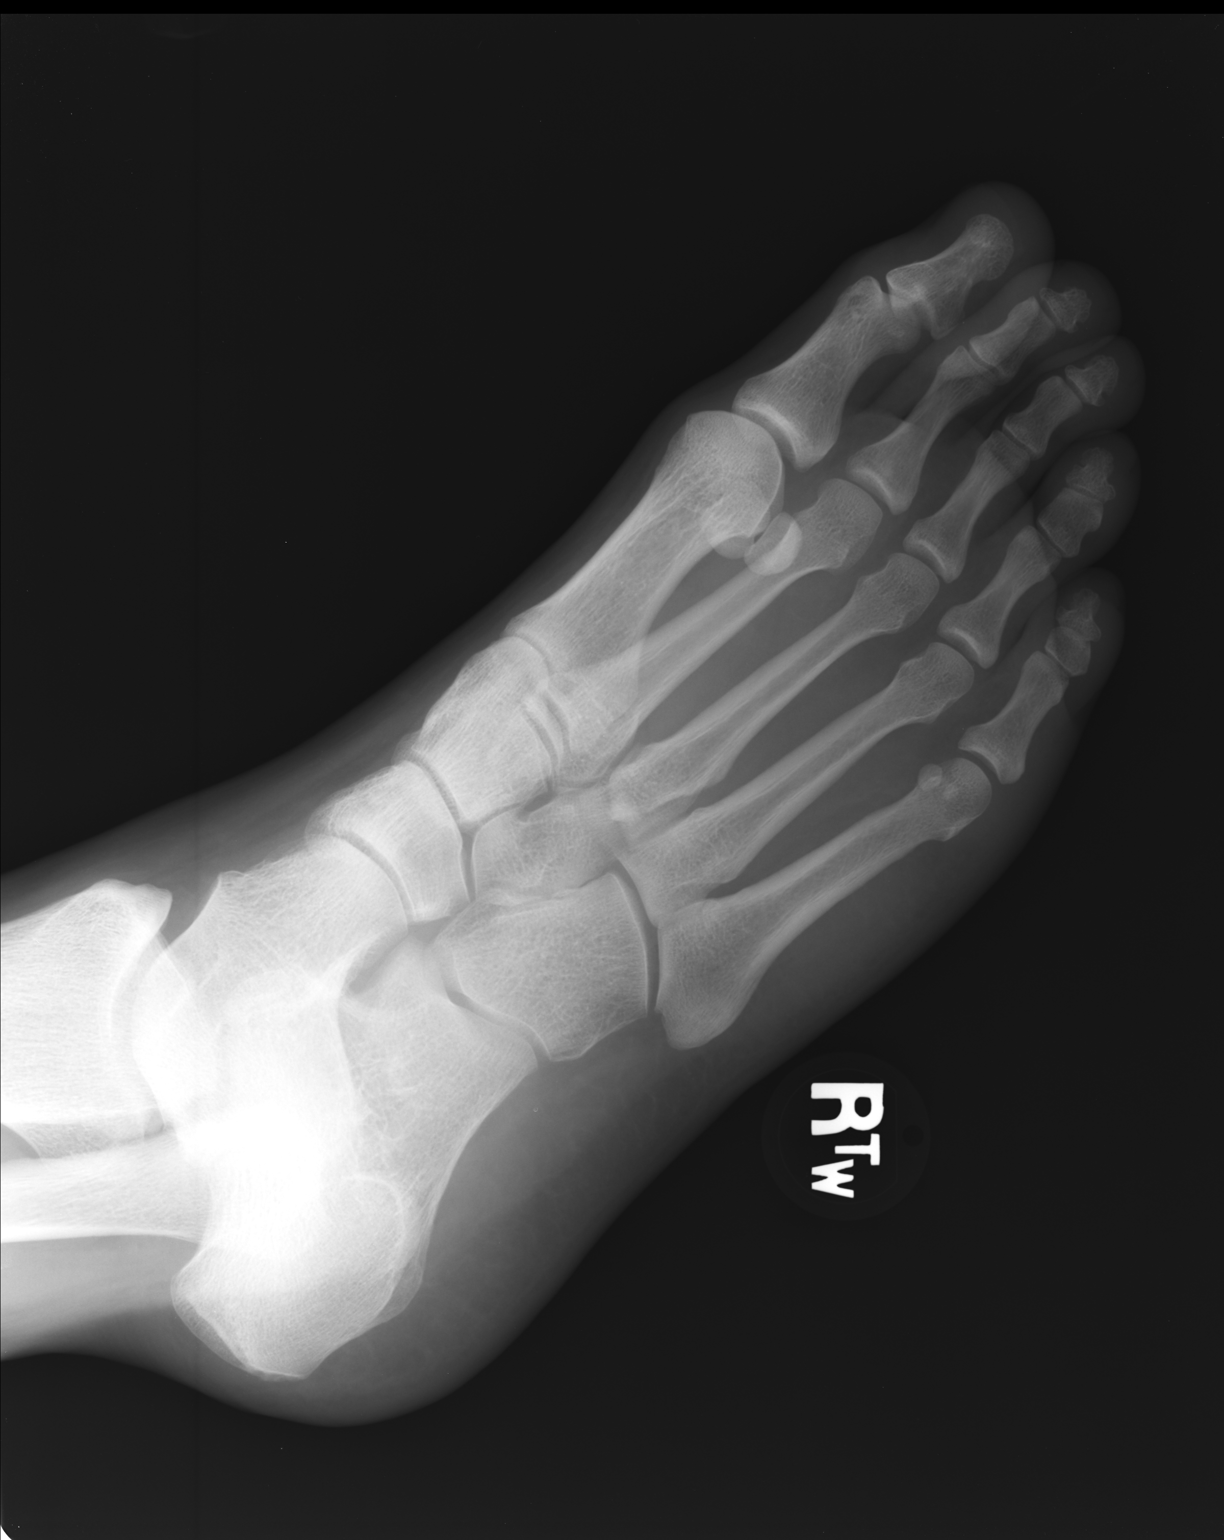

[lateral]
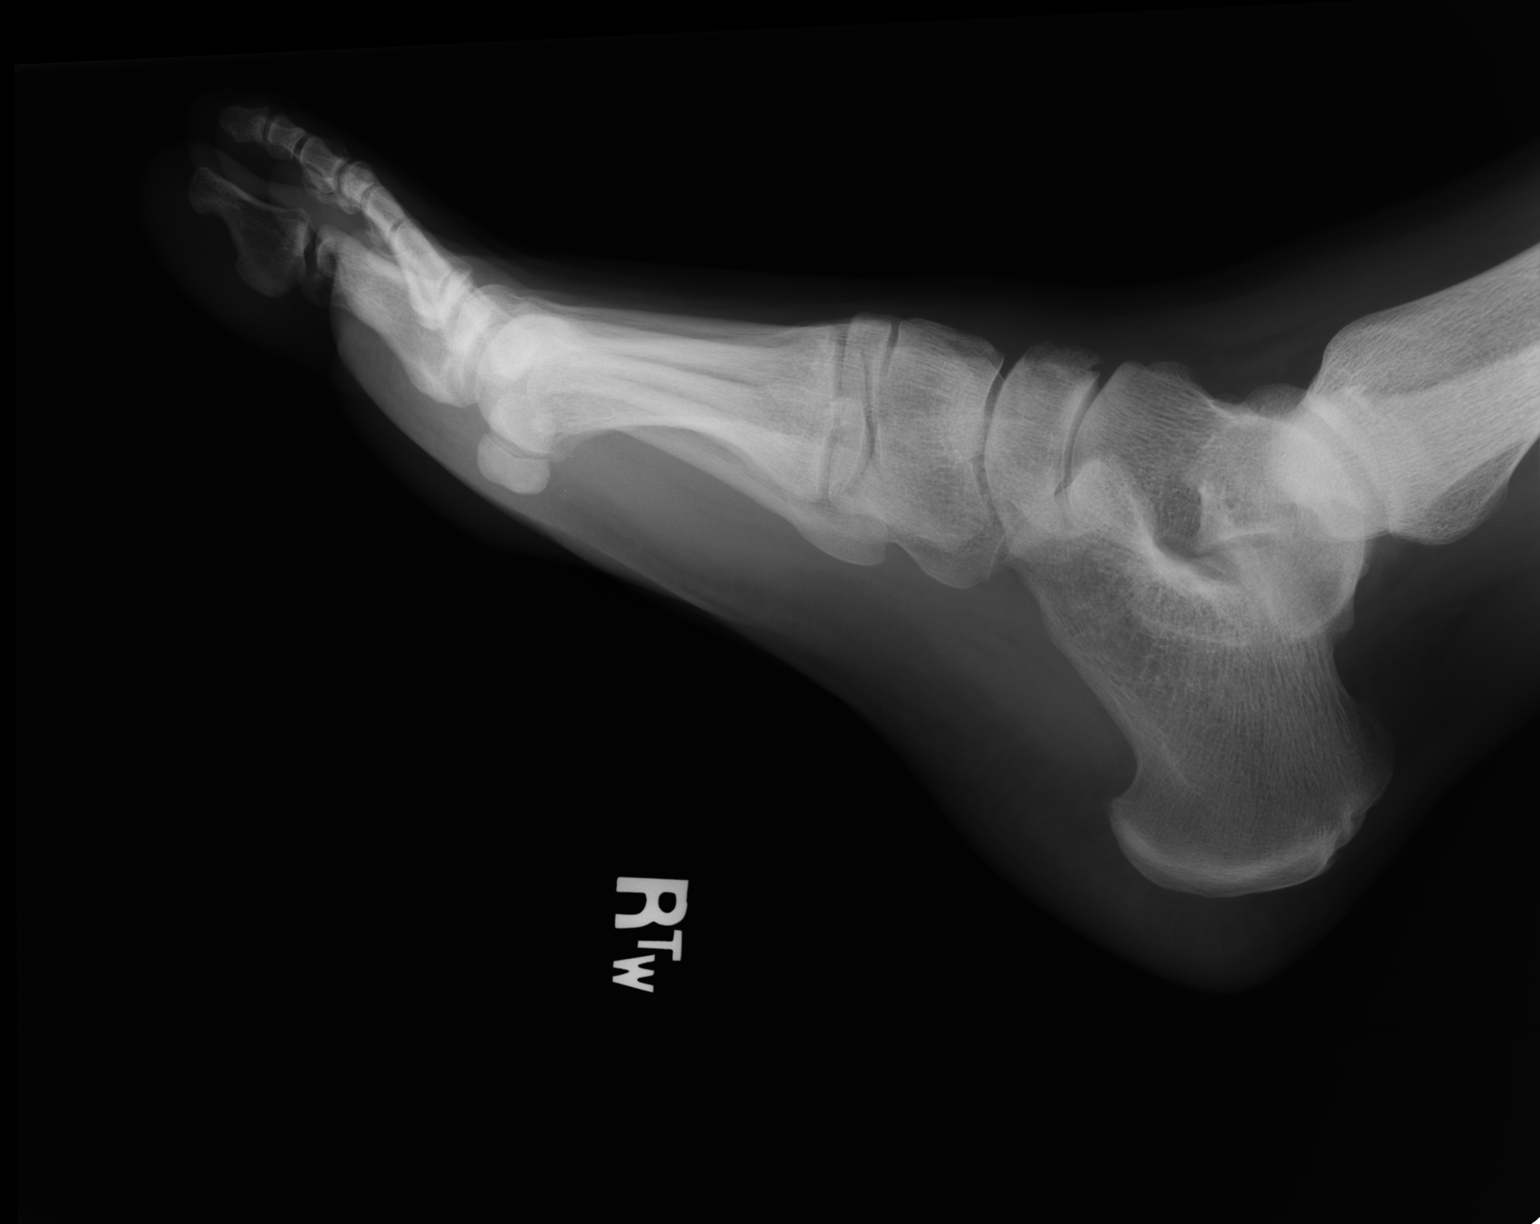

[3 of 3 positions shown; findings below may reference images not displayed]

FINDINGS: No acute fracture or dislocation.  No definite soft tissue swelling.
IMPRESSION: No acute osseous abnormality.

## 2014-12-05 IMAGING — CR DG ANKLE COMPLETE 3+V*R*
4 series · 4 of 4 positions shown · non-contrast
Comparison: Foot films, dictated separately.

CLINICAL DATA: Twisting injury with swelling and pain. Initial
encounter.

EXAM:
RIGHT ANKLE - COMPLETE 3+ VIEW

[AP]
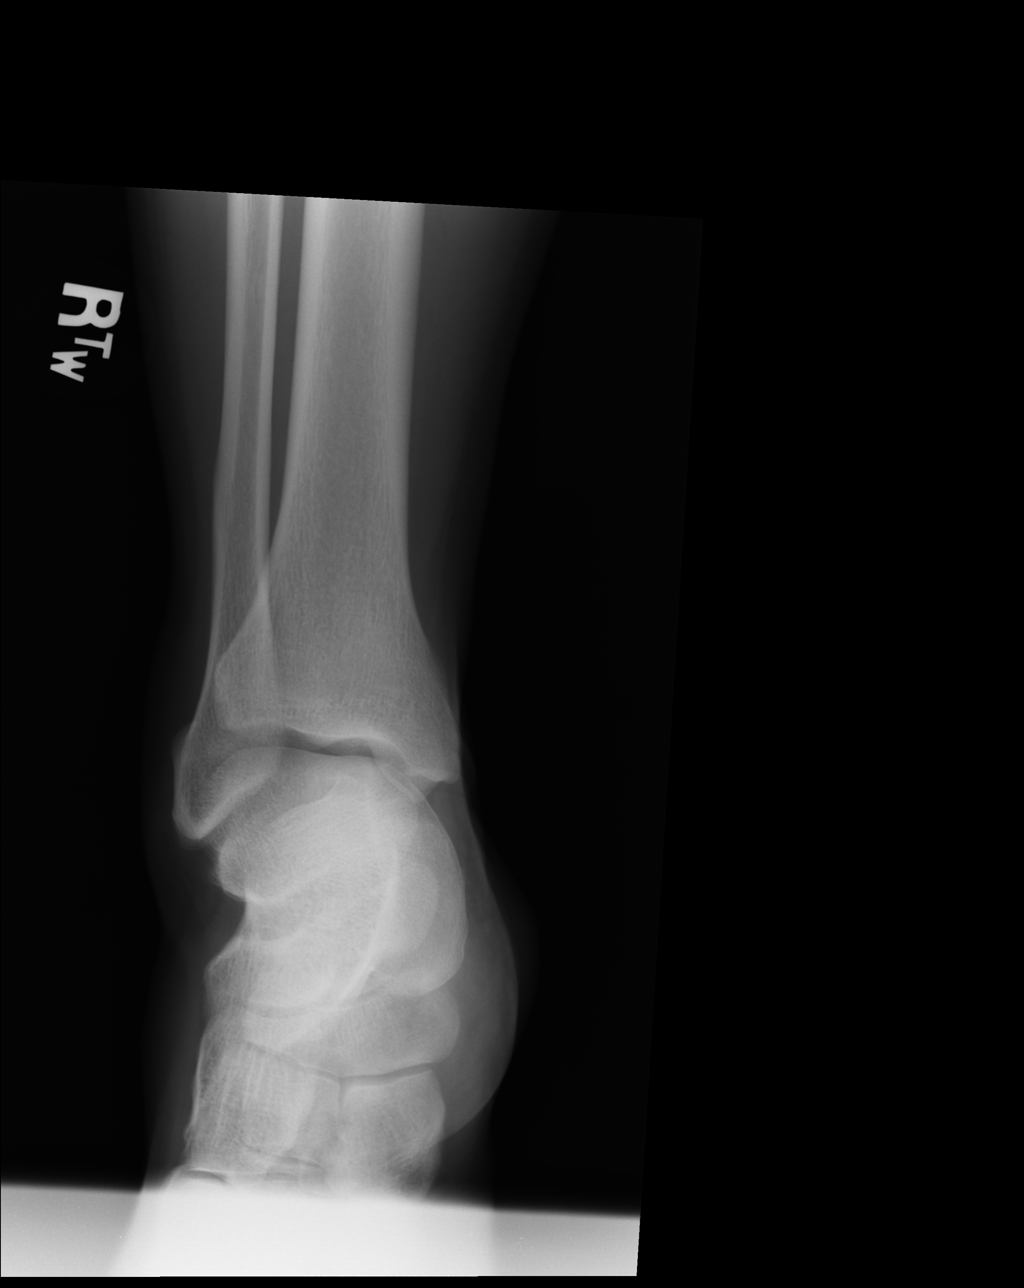

[ap obl int rot (1 of 2)]
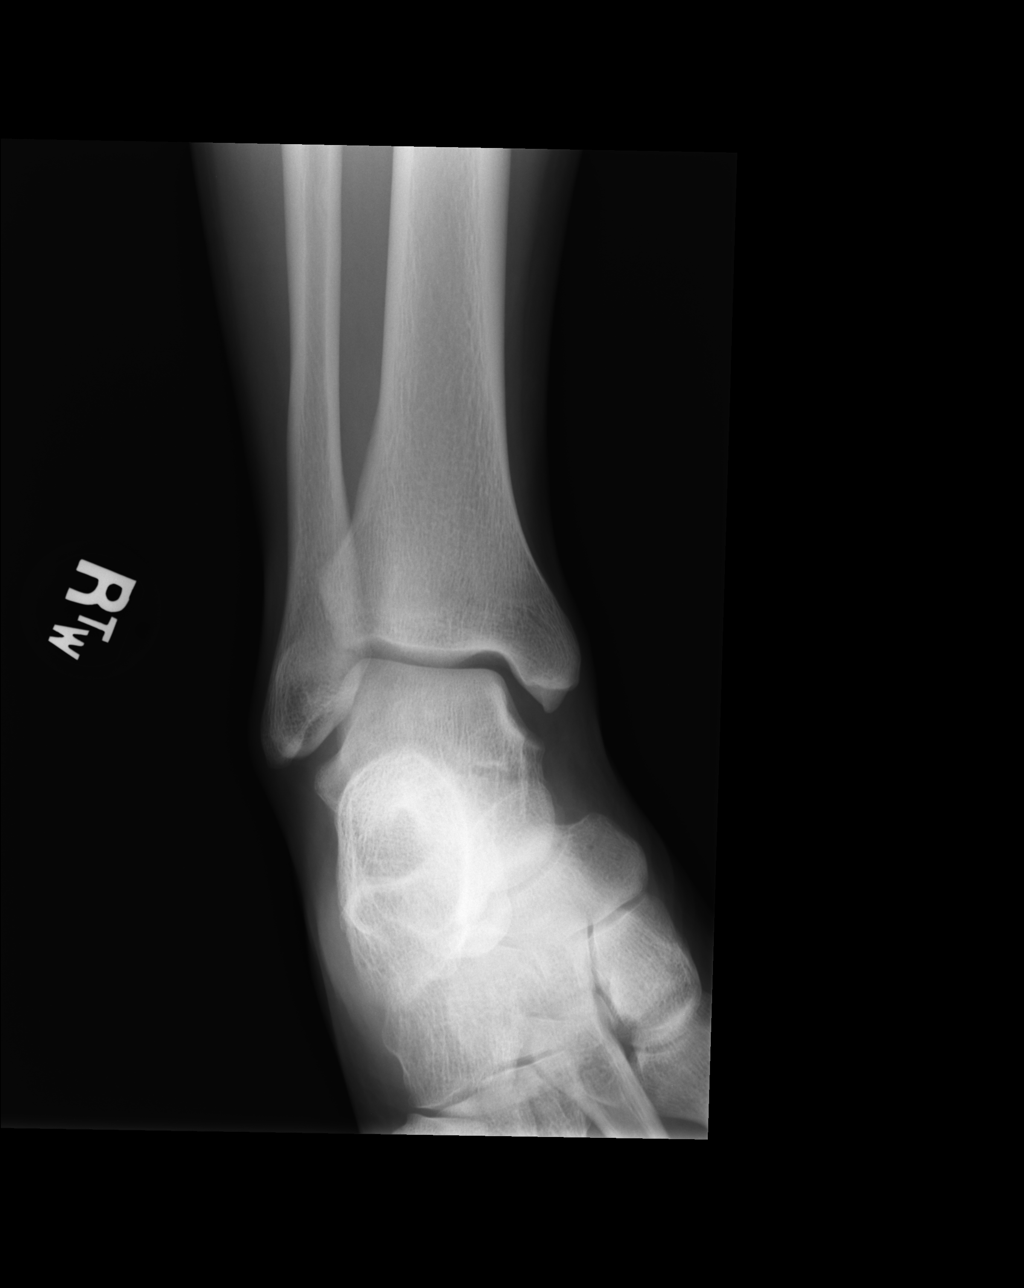

[lateral]
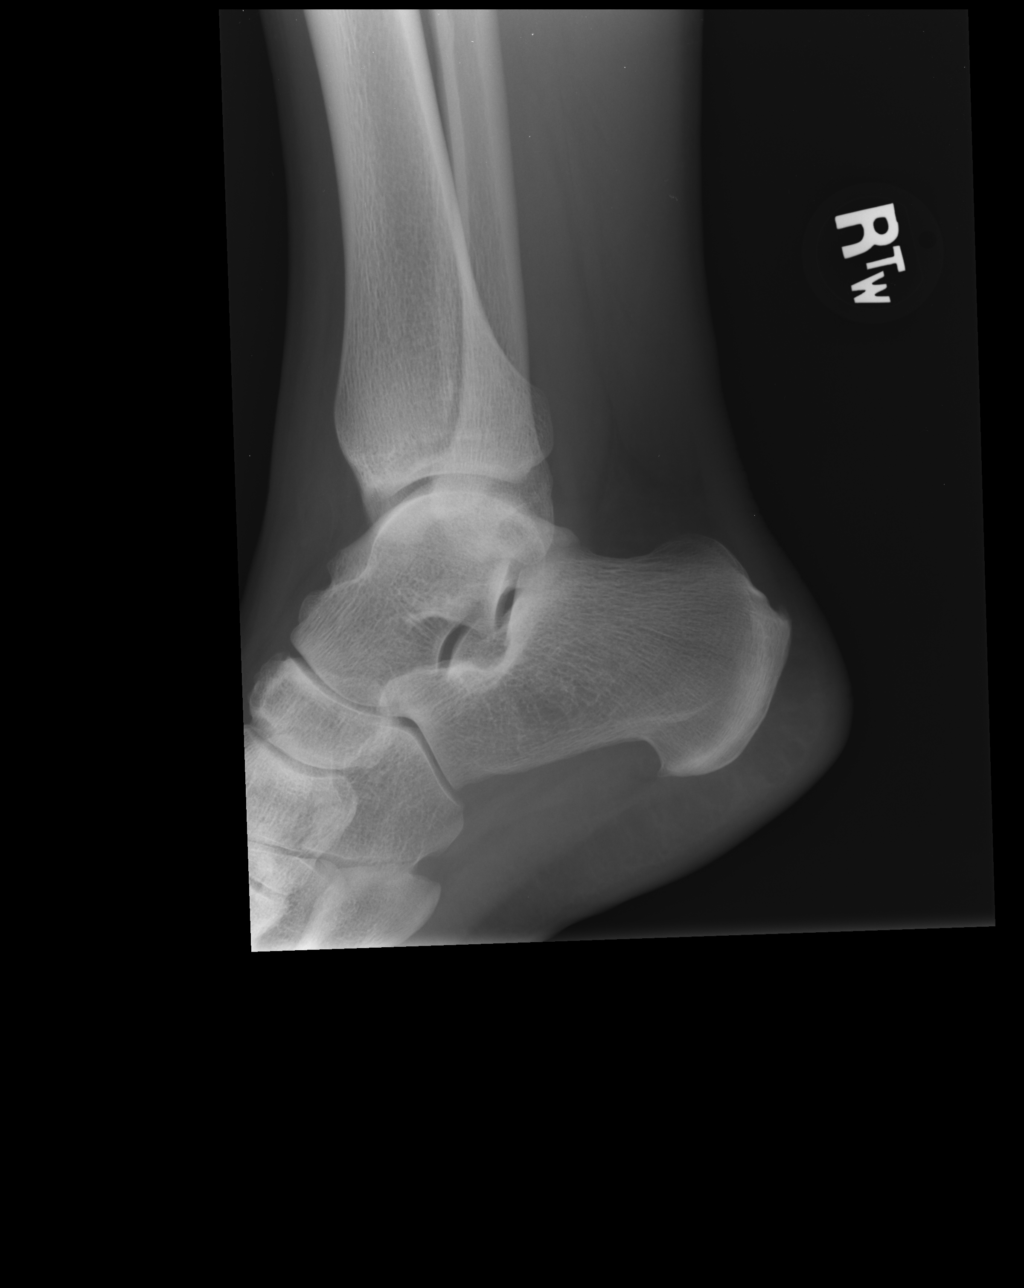

[ap obl int rot (2 of 2)]
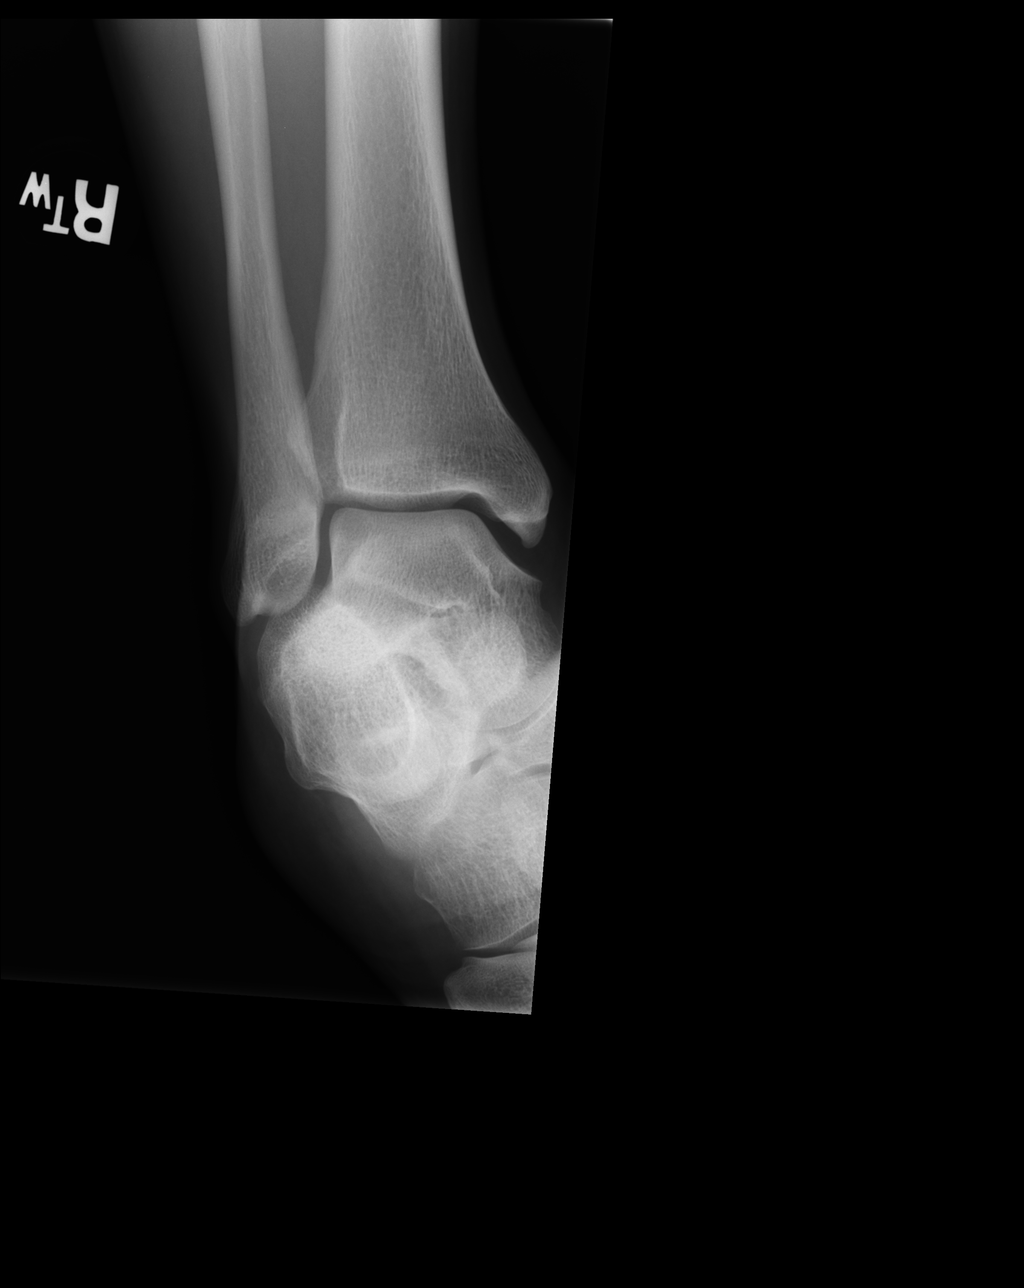

[4 of 4 positions shown; findings below may reference images not displayed]

FINDINGS: First image is suboptimal secondary to patient positioning. Mortise
and lateral views demonstrate no fracture or dislocation. Tiny
Achilles and calcaneal spurs. Base of fifth metatarsal and talar
dome intact. No definite soft tissue swelling.
IMPRESSION: No acute osseous abnormality.

## 2014-12-05 NOTE — Patient Instructions (Signed)

## 2014-12-05 NOTE — Progress Notes (Signed)
Chief Complaint:  Chief Complaint  Patient presents with  . Ankle Pain    right side     HPI: Shane Cole. is a 34 y.o. male who reports to [facility] today complaining of right ankle pain s/p twisted ankle while loading sitting lawnmower onto truck bed. He has has swelling, he was having excruciating pain, he can put weight on it. If he turns or puts weights on it the wrong way he has mild pain. He has had prior ankle insjuries but does not know which one. He is managed by pain management so will see them, otherwise is using his usual pain meds. Denies any n/w/t or warmth.  He also had a low TSH on his last visit and needs to get that rechecked. He has no hx of hypothyroidism, he has no sxs currently of worsening depression, dry skin, weight changes.  He is going to neurology for migraine headaches, has not had one as bad as the last one since the last time I saw him.   Past Medical History  Diagnosis Date  . Nerve pain   . Crush injury lower leg     Left lower leg  . Depression   . Migraine   . Bipolar disorder   . Allergy   . Anxiety   . Gastric ulcer   . GERD (gastroesophageal reflux disease)     will awaken him in night  . RSD (reflex sympathetic dystrophy)    Past Surgical History  Procedure Laterality Date  . Mass excision Left 01/25/2013    Procedure: EXCISION MASS DORSAL ASPECT LEFT LONG FINGER DISTAL INTERPHALANGEAL JOINT;  Surgeon: Wyn Forster., MD;  Location: [facility];  Service: Orthopedics;  Laterality: Left;  Left long   . Mouth surgery    . Hand surgery    . Colonoscopy    . Cholecystectomy N/A 01/24/2014    Procedure: LAPAROSCOPIC CHOLECYSTECTOMY;  Surgeon: Axel Filler, MD;  Location[facility];  Service: General;  Laterality: N/A;   Social History   Social History  . Marital Status: Married    Spouse Name: N/A  . Number of Children: 1  . Years of Education: 10th grade   Occupational History  . Disabled    Previously worked Web designer   Social History Main Topics  . Smoking status: Current Every Day Smoker -- 0.50 packs/day for 23 years    Types: Cigarettes  . Smokeless tobacco: Never Used  . Alcohol Use: 3.6 oz/week    6 Cans of beer per week     Comment: occasional  . Drug Use: No  . Sexual Activity:    Partners: Female   Other Topics Concern  . None   Social History Narrative   05/24/12  Ibrahem was born and grew up in [place], [place]. He has 2 sisters. He reports that his childhood was "lousy." He completed the 10th grade. He has been married for 5 years, and is currently separated for 3 weeks. He has a daughter who is 69-1/2 years old. He has been unemployed for 2 years, and is disabled due to an on-the-job work accident. He is currently living with his aunt and cousin. He reports that his hobbies are sports and dogs. He reports that he is spiritual, but not religious. He states that his aunt and his father are his social support system. He denies any current legal problems, but got a DUI in 2007. 05/24/12  AHW         Family History  Problem Relation Age of Onset  . Hyperlipidemia Father   . Hypertension Father   . Anxiety disorder Father   . Drug abuse Father   . Cancer Paternal Grandfather     lung, colon  . Colon cancer Paternal Grandfather   . Lung cancer Paternal Grandfather   . Stroke    . Heart disease    . Diabetes Paternal Grandmother   . Cancer Paternal Grandmother   . Cancer Maternal Grandmother   . Cancer Maternal Grandfather   . Lung cancer Maternal Grandfather   . Depression Paternal Aunt   . Anxiety disorder Paternal Aunt   . Drug abuse Paternal Uncle    Allergies  Allergen Reactions  . Aspirin Other (See Comments)    Tinnitus, dizzy, short of breath  . Codeine Itching    Reaction to tylenol #3  . Penicillins Anaphylaxis  . Amitriptyline Rash   Prior to Admission medications   Medication Sig Start Date End Date Taking?  Authorizing Provider  gabapentin (NEURONTIN) 600 MG tablet Take 1 tablet (600 mg total) by mouth 3 (three) times daily. 06/30/14  Yes Erick Colace, MD  lidocaine (XYLOCAINE) 5 % ointment Apply 1 application topically as needed. 10/12/14  Yes Jones Bales, NP  meloxicam (MOBIC) 7.5 MG tablet Take 1 tablet (7.5 mg total) by mouth daily. 10/12/14  Yes Jones Bales, NP  Multiple Vitamin (MULTIVITAMIN WITH MINERALS) TABS tablet Take 1 tablet by mouth daily.   Yes Historical Provider, MD  nortriptyline (PAMELOR) 50 MG capsule Take 1 capsule (50 mg total) by mouth at bedtime. 09/07/14  Yes Erick Colace, MD  ondansetron (ZOFRAN ODT) 4 MG disintegrating tablet Take 1 tablet (4 mg total) by mouth every 8 (eight) hours as needed for nausea or vomiting. 10/26/14  Yes Kasir Hallenbeck P Leeam Cedrone, DO  oxyCODONE (OXY IR/ROXICODONE) 5 MG immediate release tablet Take 1 tablet AS NEEDED daily for severe pain 10/12/14  Yes Jones Bales, NP  pantoprazole (PROTONIX) 40 MG tablet Take 30 minutes before breakfast and before supper. 02/13/14  Yes Rachael Fee, MD  ranitidine (ZANTAC) 150 MG tablet Take 1 tablet (150 mg total) by mouth at bedtime. 12/30/13  Yes Eliot Popper P Terril Chestnut, DO  traMADol (ULTRAM) 50 MG tablet TAKE 1 TABLET BY MOUTH EVERY 12 HOURS AS NEEDED FOR PAIN 12/01/14  Yes Erick Colace, MD  traZODone (DESYREL) 50 MG tablet Take 0.5-1 tablets (25-50 mg total) by mouth at bedtime as needed for sleep. 10/12/14  Yes Jones Bales, NP  SUMAtriptan (IMITREX) 25 MG tablet Take 1 tablet (25 mg total) by mouth every 2 (two) hours as needed for migraine. No more than 200 mg ( 8 pills)  in 24 hour period. Patient not taking: Reported on 12/05/2014 10/26/14   Maleny Candy P Marletta Bousquet, DO     ROS: The patient denies fevers, chills, night sweats, unintentional weight loss, chest pain, palpitations, wheezing, dyspnea on exertion, nausea, vomiting, abdominal pain, dysuria, hematuria, melena, numbness, weakness, or tingling.   All other systems have  been reviewed and were otherwise negative with the exception of those mentioned in the HPI and as above.    PHYSICAL EXAM: Filed Vitals:   12/05/14 1923  BP: 126/80  Pulse: 114  Temp: 98.4 F (36.9 C)  Resp: 17   Body mass index is 32.57 kg/(m^2).   General: Alert, no acute distress HEENT:  Normocephalic, atraumatic, oropharynx patent. EOMI,  PERRLA Cardiovascular:  Regular rate and rhythm, no rubs murmurs or gallops.   Respiratory: Clear to auscultation bilaterally.  No wheezes, rales, or rhonchi.  No cyanosis, no use of accessory musculature Abdominal: No organomegaly, abdomen is soft and non-tender, positive bowel sounds. No masses. Skin: No rashes. Neurologic: Facial musculature symmetric. Psychiatric: Patient acts appropriately throughout our interaction. Lymphatic: No cervical lymphadenopathy. Thyroid normal Musculoskeletal: Gait antalgic. No edema, tenderness Right ankle + swelling Full ROM Tender along lateral malleolus No ecchymosis 5/5 strength, 2/2 DTRs    LABS: Results for orders placed or performed in visit on 12/05/14  TSH  Result Value Ref Range   TSH 0.910 0.350 - 4.500 uIU/mL  T4, Free  Result Value Ref Range   Free T4 1.06 0.80 - 1.80 ng/dL  T3, Free  Result Value Ref Range   T3, Free 3.3 2.3 - 4.2 pg/mL     EKG/XRAY:   Primary read interpreted by Dr. Conley Rolls at [facility]. Neg for fracture or dislocation of foot or ankle    ASSESSMENT/PLAN: Encounter Diagnoses  Name Primary?  . Right ankle pain   . Pain in joint, ankle and foot, right   . Sprain and strain Yes  . Hypothyroidism, unspecified hypothyroidism type    [place], ROM for ankle sprain RICE prn  Labs pending He will go and see pain management for pain meds, currently ok with what he has.  FU prn   Gross sideeffects, risk and benefits, and alternatives of medications d/w patient. Patient is aware that all medications have potential sideeffects and we are unable to predict every sideeffect  or drug-drug interaction that may occur.  Donielle Kaigler DO  12/07/2014 3:13 PM

## 2014-12-06 LAB — T3, FREE: T3, Free: 3.3 pg/mL (ref 2.3–4.2)

## 2014-12-06 LAB — TSH: TSH: 0.91 u[IU]/mL (ref 0.350–4.500)

## 2014-12-06 LAB — T4, FREE: Free T4: 1.06 ng/dL (ref 0.80–1.80)

## 2014-12-08 ENCOUNTER — Ambulatory Visit (HOSPITAL_BASED_OUTPATIENT_CLINIC_OR_DEPARTMENT_OTHER): Payer: 59 | Admitting: Physical Medicine & Rehabilitation

## 2014-12-08 ENCOUNTER — Encounter: Payer: 59 | Attending: Physical Medicine & Rehabilitation

## 2014-12-08 ENCOUNTER — Encounter: Payer: Self-pay | Admitting: Physical Medicine & Rehabilitation

## 2014-12-08 VITALS — BP 156/95 | HR 97

## 2014-12-08 DIAGNOSIS — G90522 Complex regional pain syndrome I of left lower limb: Secondary | ICD-10-CM | POA: Diagnosis not present

## 2014-12-08 DIAGNOSIS — S93401A Sprain of unspecified ligament of right ankle, initial encounter: Secondary | ICD-10-CM

## 2014-12-08 DIAGNOSIS — Z5181 Encounter for therapeutic drug level monitoring: Secondary | ICD-10-CM | POA: Diagnosis not present

## 2014-12-08 DIAGNOSIS — G894 Chronic pain syndrome: Secondary | ICD-10-CM | POA: Diagnosis present

## 2014-12-08 DIAGNOSIS — M89062 Algoneurodystrophy, left lower leg: Secondary | ICD-10-CM | POA: Insufficient documentation

## 2014-12-08 DIAGNOSIS — Z79899 Other long term (current) drug therapy: Secondary | ICD-10-CM | POA: Diagnosis not present

## 2014-12-08 MED ORDER — AMITRIPTYLINE HCL 50 MG PO TABS: 50.0000 mg | ORAL_TABLET | Freq: Every day | ORAL | Status: DC

## 2014-12-08 MED ORDER — GABAPENTIN 600 MG PO TABS: 600.0000 mg | ORAL_TABLET | Freq: Three times a day (TID) | ORAL | Status: DC

## 2014-12-08 MED ORDER — OXYCODONE HCL 5 MG PO TABS: ORAL_TABLET | ORAL | Status: DC

## 2014-12-08 NOTE — Patient Instructions (Signed)

## 2014-12-08 NOTE — Progress Notes (Signed)
Subjective:    Patient ID: Shane Cole., male    DOB: 01-06-1981, 34 y.o.   MRN: [id number]  HPI Chief complaint is right foot and ankle pain  34 year old male with history of left foot reflex sympathetic dystrophy following a fracture. More recently he injured his right foot and ankle. He went to an urgent care and had x-rays demonstrating no fracture although he states that the doctor told him he had a hairline fracture. He is in a cam walker boot. He was not referred to orthopedics. I reviewed the notes from urgent care as well as the radiology note. [name] stepped in hole in yard while of floading his lawnmower from his truck. He did not seek medical attention immediately thinking it was a sprain but now they say he has a fx in his right ankle and is wearing a boot. Pain Inventory Average Pain 6 Pain Right Now 7 My pain is intermittent, sharp, burning, stabbing and aching  In the last 24 hours, has pain interfered with the following? General activity 6 Relation with others 4 Enjoyment of life 7 What TIME of day is your pain at its worst? daytime and evening Sleep (in general) NA  Pain is worse with: walking, standing and some activites Pain improves with: rest, heat/ice, medication and injections Relief from Meds: 5  Mobility use a cane how many minutes can you walk? 45 ability to climb steps?  yes do you drive?  yes  Function employed # of hrs/week 40  Neuro/Psych trouble walking spasms anxiety  Prior Studies Any changes since last visit?  no  Physicians involved in your care Any changes since last visit?  no   Family History  Problem Relation Age of Onset  . Hyperlipidemia Father   . Hypertension Father   . Anxiety disorder Father   . Drug abuse Father   . Cancer Paternal Grandfather     lung, colon  . Colon cancer Paternal Grandfather   . Lung cancer Paternal Grandfather   . Stroke    . Heart disease    . Diabetes Paternal Grandmother   .  Cancer Paternal Grandmother   . Cancer Maternal Grandmother   . Cancer Maternal Grandfather   . Lung cancer Maternal Grandfather   . Depression Paternal Aunt   . Anxiety disorder Paternal Aunt   . Drug abuse Paternal Uncle    Social History   Social History  . Marital Status: Married    Spouse Name: N/A  . Number of Children: 1  . Years of Education: 10th grade   Occupational History  . Disabled     Previously worked Web designer   Social History Main Topics  . Smoking status: Current Every Day Smoker -- 0.50 packs/day for 23 years    Types: Cigarettes  . Smokeless tobacco: Never Used  . Alcohol Use: 3.6 oz/week    6 Cans of beer per week     Comment: occasional  . Drug Use: No  . Sexual Activity:    Partners: Female   Other Topics Concern  . None   Social History Narrative   05/24/12  Kippy was born and grew up in [place], [place]. He has 2 sisters. He reports that his childhood was "lousy." He completed the 10th grade. He has been married for 5 years, and is currently separated for 3 weeks. He has a daughter who is 24-1/2 years old. He has been unemployed for 2 years, and is  disabled due to an on-the-job work accident. He is currently living with his aunt and cousin. He reports that his hobbies are sports and dogs. He reports that he is spiritual, but not religious. He states that his aunt and his father are his social support system. He denies any current legal problems, but got a DUI in 2007. 05/24/12 AHW         Past Surgical History  Procedure Laterality Date  . Mass excision Left 01/25/2013    Procedure: EXCISION MASS DORSAL ASPECT LEFT LONG FINGER DISTAL INTERPHALANGEAL JOINT;  Surgeon: Wyn Forster., MD;  Location: [facility];  Service: Orthopedics;  Laterality: Left;  Left long   . Mouth surgery    . Hand surgery    . Colonoscopy    . Cholecystectomy N/A 01/24/2014    Procedure: LAPAROSCOPIC CHOLECYSTECTOMY;  Surgeon:  Axel Filler, MD;  Location: [facility] OR;  Service: General;  Laterality: N/A;   Past Medical History  Diagnosis Date  . Nerve pain   . Crush injury lower leg     Left lower leg  . Depression   . Migraine   . Bipolar disorder   . Allergy   . Anxiety   . Gastric ulcer   . GERD (gastroesophageal reflux disease)     will awaken him in night  . RSD (reflex sympathetic dystrophy)    BP 156/95 mmHg  Pulse 97  SpO2 98%  Opioid Risk Score:   Fall Risk Score:  `1  Depression screen PHQ 2/9  Depression screen [facility] 2/9 12/08/2014 12/05/2014 10/26/2014 10/25/2014 10/12/2014 10/12/2014 09/30/2014  Decreased Interest 0 0 0 0 0 0 0  Down, Depressed, Hopeless 0 0 0 0 0 0 0  PHQ - 2 Score 0 0 0 0 0 0 0  Altered sleeping - - - - 2 - -  Tired, decreased energy - - - - 0 - -  Change in appetite - - - - 1 - -  Feeling bad or failure about yourself  - - - - 0 - -  Trouble concentrating - - - - 0 - -  Moving slowly or fidgety/restless - - - - 0 - -  Suicidal thoughts - - - - 0 - -  PHQ-9 Score - - - - 3 - -     Review of Systems  Constitutional: Positive for diaphoresis and unexpected weight change.  Gastrointestinal: Positive for nausea.  Musculoskeletal: Positive for gait problem.       Spasms  Psychiatric/Behavioral: The patient is nervous/anxious.   All other systems reviewed and are negative.      Objective:   Physical Exam  Constitutional: He is oriented to person, place, and time. He appears well-developed and well-nourished.  HENT:  Head: Normocephalic and atraumatic.  Eyes: Conjunctivae and EOM are normal. Pupils are equal, round, and reactive to light.  Neck: Normal range of motion.  Musculoskeletal:       Right ankle: He exhibits decreased range of motion. He exhibits no swelling, no ecchymosis, no deformity and normal pulse. Tenderness. Lateral malleolus and AITFL tenderness found. Achilles tendon normal.       Left ankle: He exhibits normal range of motion, no swelling, no  ecchymosis and no deformity. Tenderness.  Mild hyperhidrosis left foot  Neurological: He is alert and oriented to person, place, and time.  Psychiatric: He has a normal mood and affect.  Nursing note and vitals reviewed.  Assessment & Plan:  1. Reflex sympathetic dystrophy left lower extremity, CRPS-type 1 At this point this is chronic  He would like to have an ununited bone fragment at the base of the second left toe phalanx removed. This would likely cause a flareup of his complication pain syndrome but this may be mitigated by 1-2 preoperative and 1-2 postoperative lumbar sympathetic blocks  Currently takes oxycodone 5 mg less than once a day  Gabapentin 600 mg 3 times a day Nortriptyline 50 mg daily at bedtime. He does not get drowsy from this. He has been prescribed trazodone 50 mg daily at bedtime for sleep. We discussed switching nortriptyline to amitriptyline 50 mg daily at bedtime. This is a very similar medicine but has more sedating side effects.  Patient states that he once had a rash from this medicine but was taking several other medications as well. We decided to trial this dedication once again. He will call us if he develops a rashAnd we can switch back to nortriptyline plus trazodone  Continue lidocaine cream 5% applied 3 times per day as needed to the dorsum of the left foot    2. Right ankle sprain. This is acute. X-rays negative for fracture. No evidence of swelling or ecchymosis on examination has pain with inversion. He will be in the [employer] for another 1-2 weeks. I have given him some exercises that he can start in about one more week. These are mainly resistance exercises using a theraband. He is weightbearing as tolerated. He is not driving due to the boot  3.   headaches he has had one severe headache that lasted for 5 days. He was seen at the emergency department 8/3 and underwent CAT scan of the brain. This showed no abnormalities. He is  followed up with a neurology headache specialist. He was recommended to start Topamax. He was asked to stop the Tylenol and ibuprofen which the patient takes on an almost daily basis.Patient may have rebound headaches. I have not seen neurology notes.  Marland Kitchenakt

## 2014-12-09 ENCOUNTER — Telehealth: Payer: Self-pay | Admitting: Family Medicine

## 2014-12-09 NOTE — Telephone Encounter (Signed)
Spoke to patient about normal labs, he has odynophagia but hx of GERD and gastritis onmax doses of protonix and ranitidine so will ask himto see GI first and if  No issues then will refer to ENT

## 2014-12-26 ENCOUNTER — Telehealth: Payer: Self-pay | Admitting: Physical Medicine & Rehabilitation

## 2014-12-26 NOTE — Telephone Encounter (Signed)
He was given a refill on 12/01/14 but sig is he can take it bid and only given #30, so he got the 2nd fill 15 day later.  Do You want to increase his dispense to bo #60?

## 2014-12-26 NOTE — Telephone Encounter (Signed)
Patient will be going out of town on Friday and will run out of Tramadol.  Will need a refill before Friday.  Any questions please call patient.

## 2014-12-27 ENCOUNTER — Other Ambulatory Visit: Payer: Self-pay | Admitting: Physical Medicine & Rehabilitation

## 2014-12-27 NOTE — Telephone Encounter (Signed)
Rx called in with corrected dispense count and refills authorized

## 2014-12-27 NOTE — Telephone Encounter (Signed)
I intended tramadol to be  BID, #60 5 RF

## 2015-01-01 ENCOUNTER — Encounter: Payer: Self-pay | Admitting: Family Medicine

## 2015-01-08 ENCOUNTER — Encounter: Payer: 59 | Attending: Physical Medicine & Rehabilitation | Admitting: Registered Nurse

## 2015-01-08 ENCOUNTER — Encounter: Payer: Self-pay | Admitting: Registered Nurse

## 2015-01-08 ENCOUNTER — Other Ambulatory Visit: Payer: Self-pay | Admitting: Physical Medicine & Rehabilitation

## 2015-01-08 VITALS — BP 153/90 | HR 78

## 2015-01-08 DIAGNOSIS — G90522 Complex regional pain syndrome I of left lower limb: Secondary | ICD-10-CM

## 2015-01-08 DIAGNOSIS — Z5181 Encounter for therapeutic drug level monitoring: Secondary | ICD-10-CM | POA: Diagnosis not present

## 2015-01-08 DIAGNOSIS — G894 Chronic pain syndrome: Secondary | ICD-10-CM | POA: Insufficient documentation

## 2015-01-08 DIAGNOSIS — S93401A Sprain of unspecified ligament of right ankle, initial encounter: Secondary | ICD-10-CM

## 2015-01-08 DIAGNOSIS — M25511 Pain in right shoulder: Secondary | ICD-10-CM

## 2015-01-08 DIAGNOSIS — Z79899 Other long term (current) drug therapy: Secondary | ICD-10-CM | POA: Diagnosis not present

## 2015-01-08 DIAGNOSIS — M89062 Algoneurodystrophy, left lower leg: Secondary | ICD-10-CM | POA: Insufficient documentation

## 2015-01-08 MED ORDER — OXYCODONE HCL 5 MG PO TABS: ORAL_TABLET | ORAL | Status: DC

## 2015-01-08 MED ORDER — LIDOCAINE 5 % EX OINT
1.0000 | TOPICAL_OINTMENT | CUTANEOUS | Status: DC | PRN
Start: 2015-01-08 — End: 2015-09-06

## 2015-01-08 NOTE — Progress Notes (Signed)
Subjective:    Patient ID: Shane Cole., male    DOB: [birth year], 34 y.o.   MRN: [id number]  HPI: [name] is a 34 year old male who returns for follow up for chronic pain and medication refill. He says his pain is located in his right shoulder and left foot.He rates his pain 5. His current exercise regime walking and weight lifting.  Pain Inventory Average Pain 6 Pain Right Now 5 My pain is intermittent  In the last 24 hours, has pain interfered with the following? General activity 5 Relation with others 3 Enjoyment of life 5 What TIME of day is your pain at its worst? evening Sleep (in general) Fair  Pain is worse with: walking, bending and inactivity Pain improves with: rest, heat/ice, pacing activities, medication and injections Relief from Meds: 7  Mobility use a cane ability to climb steps?  yes do you drive?  yes Do you have any goals in this area?  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  no  Physicians involved in your care na   Family History  Problem Relation Age of Onset  . Hyperlipidemia Father   . Hypertension Father   . Anxiety disorder Father   . Drug abuse Father   . Cancer Paternal Grandfather     lung, colon  . Colon cancer Paternal Grandfather   . Lung cancer Paternal Grandfather   . Stroke    . Heart disease    . Diabetes Paternal Grandmother   . Cancer Paternal Grandmother   . Cancer Maternal Grandmother   . Cancer Maternal Grandfather   . Lung cancer Maternal Grandfather   . Depression Paternal Aunt   . Anxiety disorder Paternal Aunt   . Drug abuse Paternal Uncle    Social History   Social History  . Marital Status: Married    Spouse Name: Shane Cole  . Number of Children: 1  . Years of Education: 10th grade   Occupational History  . Disabled     Previously worked Garment/textile technologist   Social History Main Topics  . Smoking status: Current Every Day Smoker -- 0.50 packs/day for 23 years    Types: Cigarettes  . Smokeless tobacco: Never Used  . Alcohol Use: 3.6 oz/week    6 Cans of beer per week     Comment: occasional  . Drug Use: No  . Sexual Activity:    Partners: Female   Other Topics Concern  . None   Social History Narrative   05/24/12  Shane Cole was born and grew up in [place], [place]. He has 2 sisters. He reports that his childhood was "lousy." He completed the 10th grade. He has been married for 5 years, and is currently separated for 3 weeks. He has a daughter who is 30-1/2 years old. He has been unemployed for 2 years, and is disabled due to an on-the-job work accident. He is currently living with his aunt and cousin. He reports that his hobbies are sports and dogs. He reports that he is spiritual, but not religious. He states that his aunt and his father are his social support system. He denies any current legal problems, but got a DUI in 2007. 05/24/12 AHW         Past Surgical History  Procedure Laterality Date  . Mass excision Left  01/25/2013    Procedure: EXCISION MASS DORSAL ASPECT LEFT LONG FINGER DISTAL INTERPHALANGEAL JOINT;  Surgeon: Wyn Forsterobert V Sypher Jr., MD;  Location: [facility];  Service: Orthopedics;  Laterality: Left;  Left long   . Mouth surgery    . Hand surgery    . Colonoscopy    . Cholecystectomy Shane Cole 01/24/2014    Procedure: LAPAROSCOPIC CHOLECYSTECTOMY;  Surgeon: Axel FillerArmando Ramirez, MD;  [facility];  Service: General;  Laterality: Shane Cole;   Past Medical History  Diagnosis Date  . Nerve pain   . Crush injury lower leg     Left lower leg  . Depression   . Migraine   . Bipolar disorder (HCC)   . Allergy   . Anxiety   . Gastric ulcer   . GERD (gastroesophageal reflux disease)     will awaken him in night  . RSD (reflex sympathetic dystrophy)    BP 153/90 mmHg  Pulse 78  SpO2 98%  Opioid Risk  Score:   Fall Risk Score:  `1  Depression screen PHQ 2/9  [facility] 12/08/2014 12/05/2014 10/26/2014 10/25/2014 10/12/2014 10/12/2014 09/30/2014  Decreased Interest 0 0 0 0 0 0 0  Down, Depressed, Hopeless 0 0 0 0 0 0 0  PHQ - 2 Score 0 0 0 0 0 0 0  Altered sleeping - - - - 2 - -  Tired, decreased energy - - - - 0 - -  Change in appetite - - - - 1 - -  Feeling bad or failure about yourself  - - - - 0 - -  Trouble concentrating - - - - 0 - -  Moving slowly or fidgety/restless - - - - 0 - -  Suicidal thoughts - - - - 0 - -  PHQ-9 Score - - - - 3 - -     Review of Systems  Constitutional: Positive for unexpected weight change.  Gastrointestinal: Positive for nausea.  All other systems reviewed and are negative.      Objective:   Physical Exam  Constitutional: He is oriented to person, place, and time. He appears well-developed and well-nourished.  HENT:  Head: Normocephalic and atraumatic.  Neck: Normal range of motion. Neck supple.  Cardiovascular: Normal rate and regular rhythm.   Pulmonary/Chest: Effort normal and breath sounds normal.  Musculoskeletal:  Normal Muscle Bulk and Muscle Testing Reveals: Upper Extremities: Left:Full ROM and Muscle Strength 5/5 Right: Decreased ROM 90 Degrees and Muscle Strength 5/5 Lower Extremities: Full ROM and Muscle Strength 5/5 Arises from chair with ease Narrow Based gait   Neurological: He is alert and oriented to person, place, and time.  Skin: Skin is warm and dry.  Psychiatric: He has a normal mood and affect.  Nursing note and vitals reviewed.         Assessment & Plan:  1 Reflex Sympathetic Dystrophy related to crush injury left foot. 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 Refilled: Oxycodone 5 mg daily #30. Continue Tramadol. Continue with Ice and Exercise Regime. 2. Right Shoulder Pain: RX: X-ray  20 minutes of face to face patient care time was spent during this visit. All questions were  encouraged answered.  F/U in 1 month

## 2015-01-08 NOTE — Addendum Note (Signed)
Addended by: Angela NevinWESSLING, Shelvy Heckert D on: 01/08/2015 11:23 AM   Modules accepted: Orders

## 2015-01-09 LAB — PMP ALCOHOL METABOLITE (ETG): Ethyl Glucuronide (EtG): NEGATIVE ng/mL

## 2015-01-10 LAB — PRESCRIPTION MONITORING PROFILE (SOLSTAS)
Amphetamine/Meth: NEGATIVE ng/mL
BENZODIAZEPINE SCREEN, URINE: NEGATIVE ng/mL
Barbiturate Screen, Urine: NEGATIVE ng/mL
Buprenorphine, Urine: NEGATIVE ng/mL
CREATININE, URINE: 128.21 mg/dL (ref >20.0)
Cannabinoid Scrn, Ur: NEGATIVE ng/mL
Carisoprodol, Urine: NEGATIVE ng/mL
Cocaine Metabolites: NEGATIVE ng/mL
ECSTASY: NEGATIVE ng/mL
FENTANYL URINE: NEGATIVE ng/mL
Meperidine, Ur: NEGATIVE ng/mL
Methadone Screen, Urine: NEGATIVE ng/mL
NITRITES URINE, INITIAL: NEGATIVE ug/mL
Opiate Screen, Urine: NEGATIVE ng/mL
Oxycodone Screen, Ur: NEGATIVE ng/mL
PH URINE, INITIAL: 5.2 pH (ref 4.5–8.9)
Propoxyphene: NEGATIVE ng/mL
Tapentadol, urine: NEGATIVE ng/mL
Tramadol Scrn, Ur: NEGATIVE ng/mL
ZOLPIDEM, URINE: NEGATIVE ng/mL

## 2015-02-01 NOTE — Progress Notes (Addendum)
Urine drug screen for this encounter is inconsistent for prescribed medication, patient reported oxycodone and tramadol, urine negative for metabolites

## 2015-02-05 ENCOUNTER — Encounter: Payer: 59 | Attending: Physical Medicine & Rehabilitation | Admitting: Registered Nurse

## 2015-02-05 ENCOUNTER — Telehealth: Payer: Self-pay | Admitting: *Deleted

## 2015-02-05 DIAGNOSIS — G894 Chronic pain syndrome: Secondary | ICD-10-CM | POA: Insufficient documentation

## 2015-02-05 DIAGNOSIS — M89062 Algoneurodystrophy, left lower leg: Secondary | ICD-10-CM | POA: Insufficient documentation

## 2015-02-05 DIAGNOSIS — Z5181 Encounter for therapeutic drug level monitoring: Secondary | ICD-10-CM | POA: Insufficient documentation

## 2015-02-05 DIAGNOSIS — Z79899 Other long term (current) drug therapy: Secondary | ICD-10-CM | POA: Insufficient documentation

## 2015-02-05 NOTE — Telephone Encounter (Signed)
Patient's urine drug screen was inconsistent. Patient reported taking oxycodone and tramadol 01/07/15. Urine specimen collected 01/08/2015. No metabolites present for either medication according to UDS report

## 2015-02-05 NOTE — Telephone Encounter (Signed)
Discharged for noncompliance with controlled substance agreement

## 2015-02-07 ENCOUNTER — Encounter: Payer: Self-pay | Admitting: Physical Medicine & Rehabilitation

## 2015-02-07 NOTE — Telephone Encounter (Signed)
Discharge letter prepared

## 2015-02-12 ENCOUNTER — Encounter: Payer: Self-pay | Admitting: Physical Medicine & Rehabilitation

## 2015-02-12 ENCOUNTER — Ambulatory Visit (HOSPITAL_BASED_OUTPATIENT_CLINIC_OR_DEPARTMENT_OTHER): Payer: 59 | Admitting: Physical Medicine & Rehabilitation

## 2015-02-12 VITALS — BP 147/82 | HR 93 | Resp 14

## 2015-02-12 DIAGNOSIS — Z79899 Other long term (current) drug therapy: Secondary | ICD-10-CM | POA: Diagnosis not present

## 2015-02-12 DIAGNOSIS — M7541 Impingement syndrome of right shoulder: Secondary | ICD-10-CM

## 2015-02-12 DIAGNOSIS — Z5181 Encounter for therapeutic drug level monitoring: Secondary | ICD-10-CM | POA: Diagnosis not present

## 2015-02-12 DIAGNOSIS — G540 Brachial plexus disorders: Secondary | ICD-10-CM

## 2015-02-12 DIAGNOSIS — M89062 Algoneurodystrophy, left lower leg: Secondary | ICD-10-CM | POA: Diagnosis not present

## 2015-02-12 DIAGNOSIS — G894 Chronic pain syndrome: Secondary | ICD-10-CM | POA: Diagnosis present

## 2015-02-12 MED ORDER — OXYCODONE HCL 5 MG PO TABS: ORAL_TABLET | ORAL | Status: DC

## 2015-02-12 MED ORDER — TRAMADOL HCL 50 MG PO TABS: 50.0000 mg | ORAL_TABLET | Freq: Two times a day (BID) | ORAL | Status: DC | PRN

## 2015-02-12 NOTE — Progress Notes (Signed)
Subjective:    Patient ID: Shane Cole., male    DOB: May 31, 1980, 34 y.o.   MRN: [id number]  HPI Takes tramadol and oxycodone on a prn basis, not daily  Right shoulder pain increasing over the last few weeks, partial tear of Right rotator cuff about 9 years ago (prior to LLE injury) Pain with overhead reaching Also has numbness in little finger on Right side no hand weakness Patient without significant neck pain  Pain Inventory Average Pain 5 Pain Right Now 7 My pain is sharp, burning, stabbing and aching  In the last 24 hours, has pain interfered with the following? General activity 5 Relation with others 3 Enjoyment of life 6 What TIME of day is your pain at its worst? morning,daytime  Sleep (in general) Fair  Pain is worse with: walking, bending, standing and some activites Pain improves with: rest, heat/ice, pacing activities, medication and injections Relief from Meds: 4  Mobility walk without assistance walk with assistance use a cane how many minutes can you walk? 30-45 ability to climb steps?  yes do you drive?  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  no  Physicians involved in your care Any changes since last visit?  no   Family History  Problem Relation Age of Onset  . Hyperlipidemia Father   . Hypertension Father   . Anxiety disorder Father   . Drug abuse Father   . Cancer Paternal Grandfather     lung, colon  . Colon cancer Paternal Grandfather   . Lung cancer Paternal Grandfather   . Stroke    . Heart disease    . Diabetes Paternal Grandmother   . Cancer Paternal Grandmother   . Cancer Maternal Grandmother   . Cancer Maternal Grandfather   . Lung cancer Maternal Grandfather   . Depression Paternal Aunt   . Anxiety disorder Paternal Aunt   . Drug abuse Paternal Uncle    Social History   Social History  .  Marital Status: Married    Spouse Name: N/A  . Number of Children: 1  . Years of Education: 10th grade   Occupational History  . Disabled     Previously worked Web designer   Social History Main Topics  . Smoking status: Current Every Day Smoker -- 0.50 packs/day for 23 years    Types: Cigarettes  . Smokeless tobacco: Never Used  . Alcohol Use: 3.6 oz/week    6 Cans of beer per week     Comment: occasional  . Drug Use: No  . Sexual Activity:    Partners: Female   Other Topics Concern  . None   Social History Narrative   05/24/12  Shane Cole was born and grew up in [place], [place]. He has 2 sisters. He reports that his childhood was "lousy." He completed the 10th grade. He has been married for 5 years, and is currently separated for 3 weeks. He has a daughter who is 50-1/2 years old. He has been unemployed for 2 years, and is disabled due to an on-the-job work accident. He is currently living with his aunt and cousin. He reports that his hobbies are sports and dogs. He reports that he is spiritual, but not religious. He states that his aunt and his father are his social support system. He denies any current legal problems, but got a DUI  in 2007. 05/24/12 AHW         Past Surgical History  Procedure Laterality Date  . Mass excision Left 01/25/2013    Procedure: EXCISION MASS DORSAL ASPECT LEFT LONG FINGER DISTAL INTERPHALANGEAL JOINT;  Surgeon: Wyn Forsterobert V Sypher Jr., MD;  Location: [facility];  Service: Orthopedics;  Laterality: Left;  Left long   . Mouth surgery    . Hand surgery    . Colonoscopy    . Cholecystectomy N/A 01/24/2014    Procedure: LAPAROSCOPIC CHOLECYSTECTOMY;  Surgeon: Axel FillerArmando Ramirez, MD;  [facility];  Service: General;  Laterality: N/A;   Past Medical History  Diagnosis Date  . Nerve pain   . Crush injury lower leg     Left lower leg  . Depression   . Migraine   . Bipolar disorder (HCC)   . Allergy   . Anxiety   . Gastric  ulcer   . GERD (gastroesophageal reflux disease)     will awaken him in night  . RSD (reflex sympathetic dystrophy)    BP 147/82 mmHg  Pulse 93  Resp 14  SpO2 97%  Opioid Risk Score:   Fall Risk Score:  `1  Depression screen PHQ 2/9  [facility] 12/08/2014 12/05/2014 10/26/2014 10/25/2014 10/12/2014 10/12/2014 09/30/2014  Decreased Interest 0 0 0 0 0 0 0  Down, Depressed, Hopeless 0 0 0 0 0 0 0  PHQ - 2 Score 0 0 0 0 0 0 0  Altered sleeping - - - - 2 - -  Tired, decreased energy - - - - 0 - -  Change in appetite - - - - 1 - -  Feeling bad or failure about yourself  - - - - 0 - -  Trouble concentrating - - - - 0 - -  Moving slowly or fidgety/restless - - - - 0 - -  Suicidal thoughts - - - - 0 - -  PHQ-9 Score - - - - 3 - -      Review of Systems  Constitutional: Positive for diaphoresis.  Gastrointestinal: Positive for nausea and diarrhea.  Musculoskeletal: Positive for gait problem.  Neurological: Positive for weakness.  All other systems reviewed and are negative.      Objective:   Physical Exam  Constitutional: He is oriented to person, place, and time. He appears well-developed and well-nourished.  HENT:  Head: Normocephalic and atraumatic.  Eyes: Conjunctivae and EOM are normal. Pupils are equal, round, and reactive to light.  Neck: Normal range of motion.  Neurological: He is alert and oriented to person, place, and time.  Psychiatric: He has a normal mood and affect.  Nursing note and vitals reviewed.   Positive Hawkins Kennedy maneuver Negative Spurling's Tenderness palpation over the right lateral pectoralis Motor strength 5/5 bilateral deltoid, biceps, triceps, grip Pain with external and internal rotation of the right shoulder this is both anteriorly as well as posteriorly. Negative Tinel's at the elbow Sensation reduced bilateral fifth digits, right second digit otherwise normal sensation to pinprick Deep tendon reflexes 2+ bilateral biceps  triceps brachial radialis Neck pain-free with range of motion       Assessment & Plan:  1. Right shoulder pain multifactorial he has both anterior pain as well as posterior pain. Has subacromial impingement syndrome  Subacromial injection today  Shoulder injection Right subacromial   Indication:Right Shoulder pain not relieved by medication management and other conservative care.  Informed consent was obtained after describing risks and  benefits of the procedure with the patient, this includes bleeding, bruising, infection and medication side effects. The patient wishes to proceed and has given written consent. Patient was placed in a seated position. The Right shoulder was marked and prepped with betadine in the subacromial area. A 25-gauge 1-1/2 inch needle was inserted into the subacromial area. After negative draw back for blood, a solution containing 1 mL of 6 mg per ML betamethasone and 4 mL of 1% lidocaine was injected. A band aid was applied. The patient tolerated the procedure well. Post procedure instructions were given.  2.Right anterior shoulder pain, tenderness as well as little finger numbness appears to be a thoracic outlet syndrome pectoralis minor likely implicated  We'll refer to physical therapy  3. Chronic left lower extremity pain, reflex sympathetic dystrophy continue oxycodone 5 mg when necessary #30 to last for a month, tramadol 50 mg by mouth when necessary #30 to last for month as well as gabapentin 600 mg

## 2015-02-13 ENCOUNTER — Ambulatory Visit: Payer: 59 | Admitting: Registered Nurse

## 2015-02-20 ENCOUNTER — Other Ambulatory Visit: Payer: Self-pay | Admitting: Registered Nurse

## 2015-02-26 ENCOUNTER — Ambulatory Visit: Payer: 59 | Attending: Physical Medicine & Rehabilitation

## 2015-03-12 ENCOUNTER — Ambulatory Visit (HOSPITAL_COMMUNITY)
Admission: RE | Admit: 2015-03-12 | Discharge: 2015-03-12 | Disposition: A | Discharge status: Home / Self Care | Payer: 59 | Source: Ambulatory Visit | Attending: Registered Nurse | Admitting: Registered Nurse

## 2015-03-12 ENCOUNTER — Encounter: Payer: 59 | Attending: Physical Medicine & Rehabilitation | Admitting: Registered Nurse

## 2015-03-12 ENCOUNTER — Encounter: Payer: Self-pay | Admitting: Registered Nurse

## 2015-03-12 VITALS — BP 137/77 | HR 95

## 2015-03-12 DIAGNOSIS — Z79899 Other long term (current) drug therapy: Secondary | ICD-10-CM | POA: Diagnosis not present

## 2015-03-12 DIAGNOSIS — Z5181 Encounter for therapeutic drug level monitoring: Secondary | ICD-10-CM | POA: Insufficient documentation

## 2015-03-12 DIAGNOSIS — M89062 Algoneurodystrophy, left lower leg: Secondary | ICD-10-CM | POA: Diagnosis not present

## 2015-03-12 DIAGNOSIS — M25511 Pain in right shoulder: Secondary | ICD-10-CM | POA: Diagnosis present

## 2015-03-12 DIAGNOSIS — G90522 Complex regional pain syndrome I of left lower limb: Secondary | ICD-10-CM

## 2015-03-12 DIAGNOSIS — G894 Chronic pain syndrome: Secondary | ICD-10-CM | POA: Insufficient documentation

## 2015-03-12 DIAGNOSIS — G8929 Other chronic pain: Secondary | ICD-10-CM | POA: Insufficient documentation

## 2015-03-12 IMAGING — DX DG SHOULDER 2+V*R*
3 series · 3 of 3 positions shown · non-contrast
Comparison: None.

CLINICAL DATA: Chronic pain.  No reported injury.

EXAM:
RIGHT SHOULDER - 2+ VIEW

[shoulder grashey]
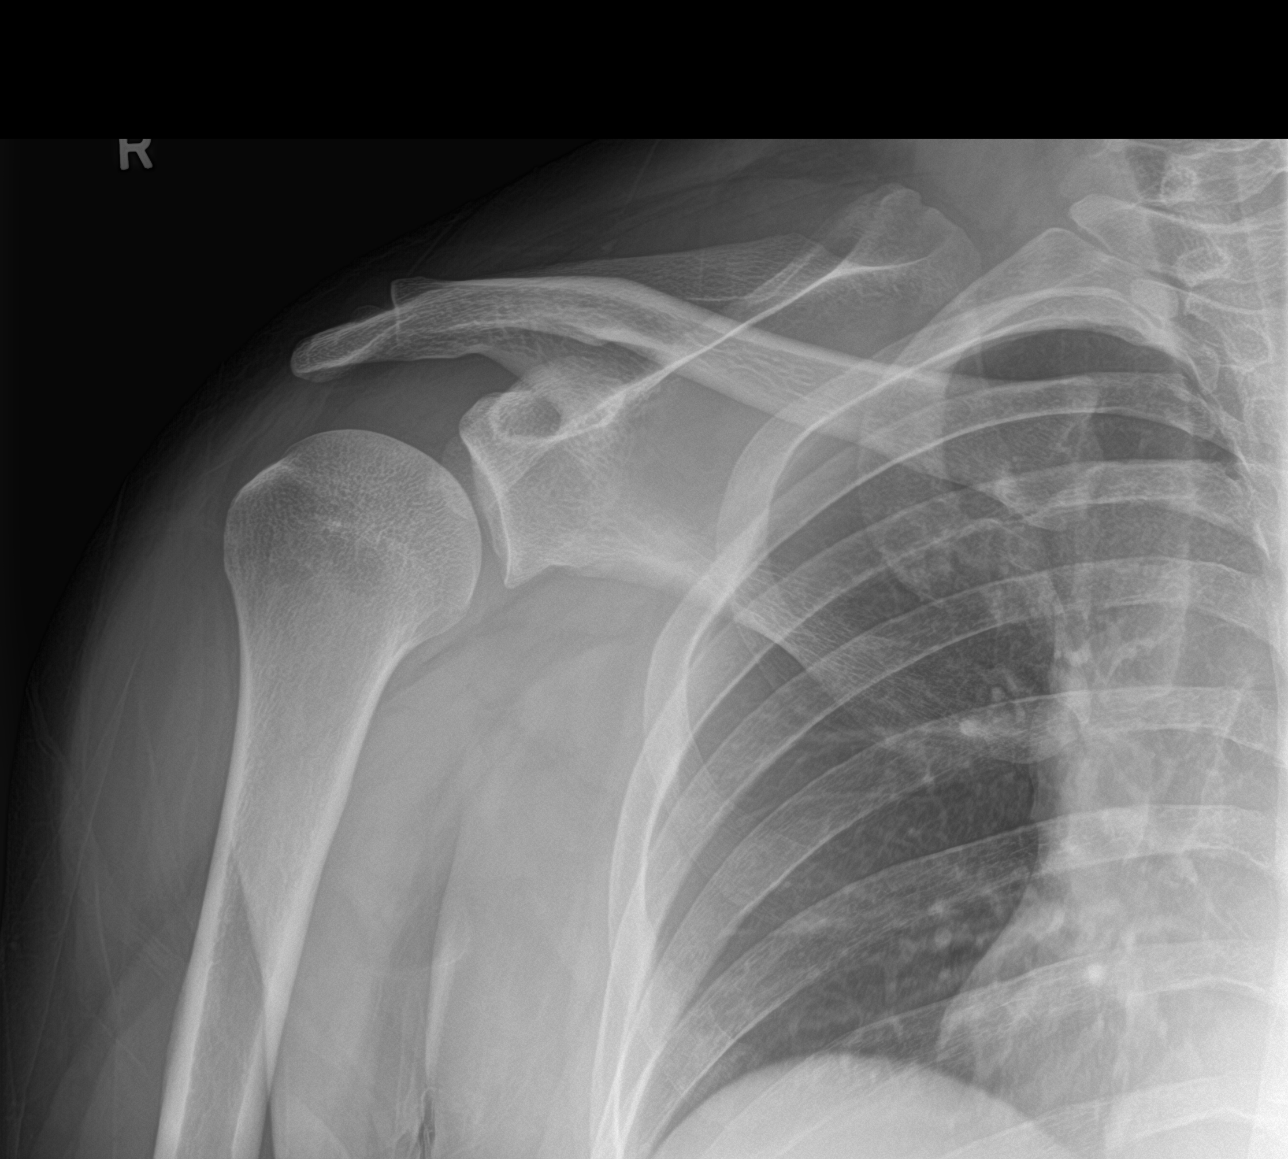

[shoulder y view]
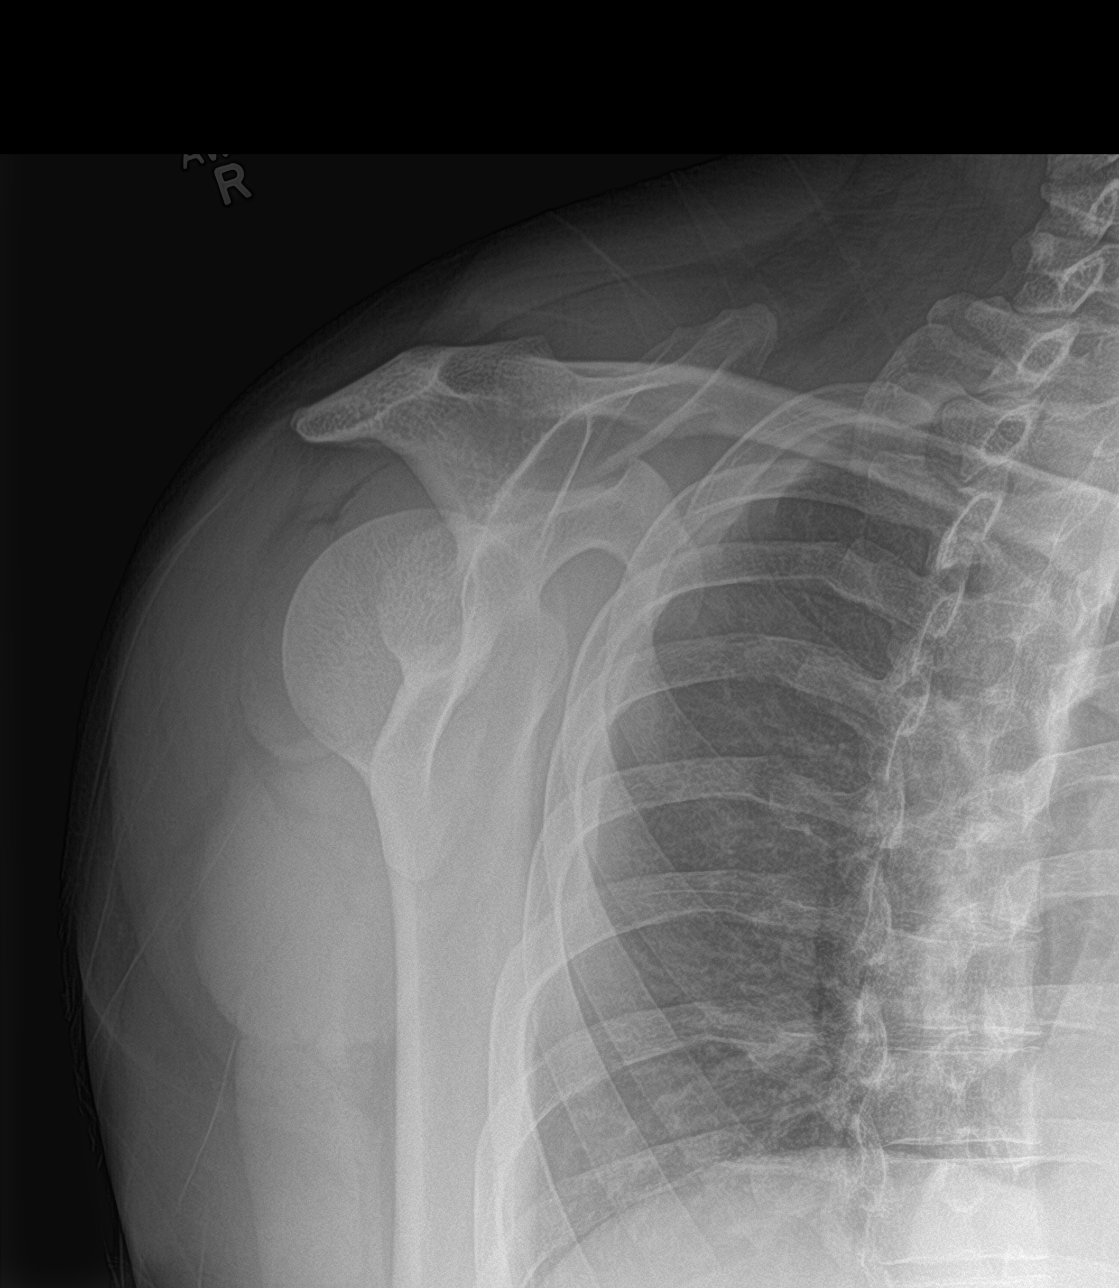

[shoulder axillary]
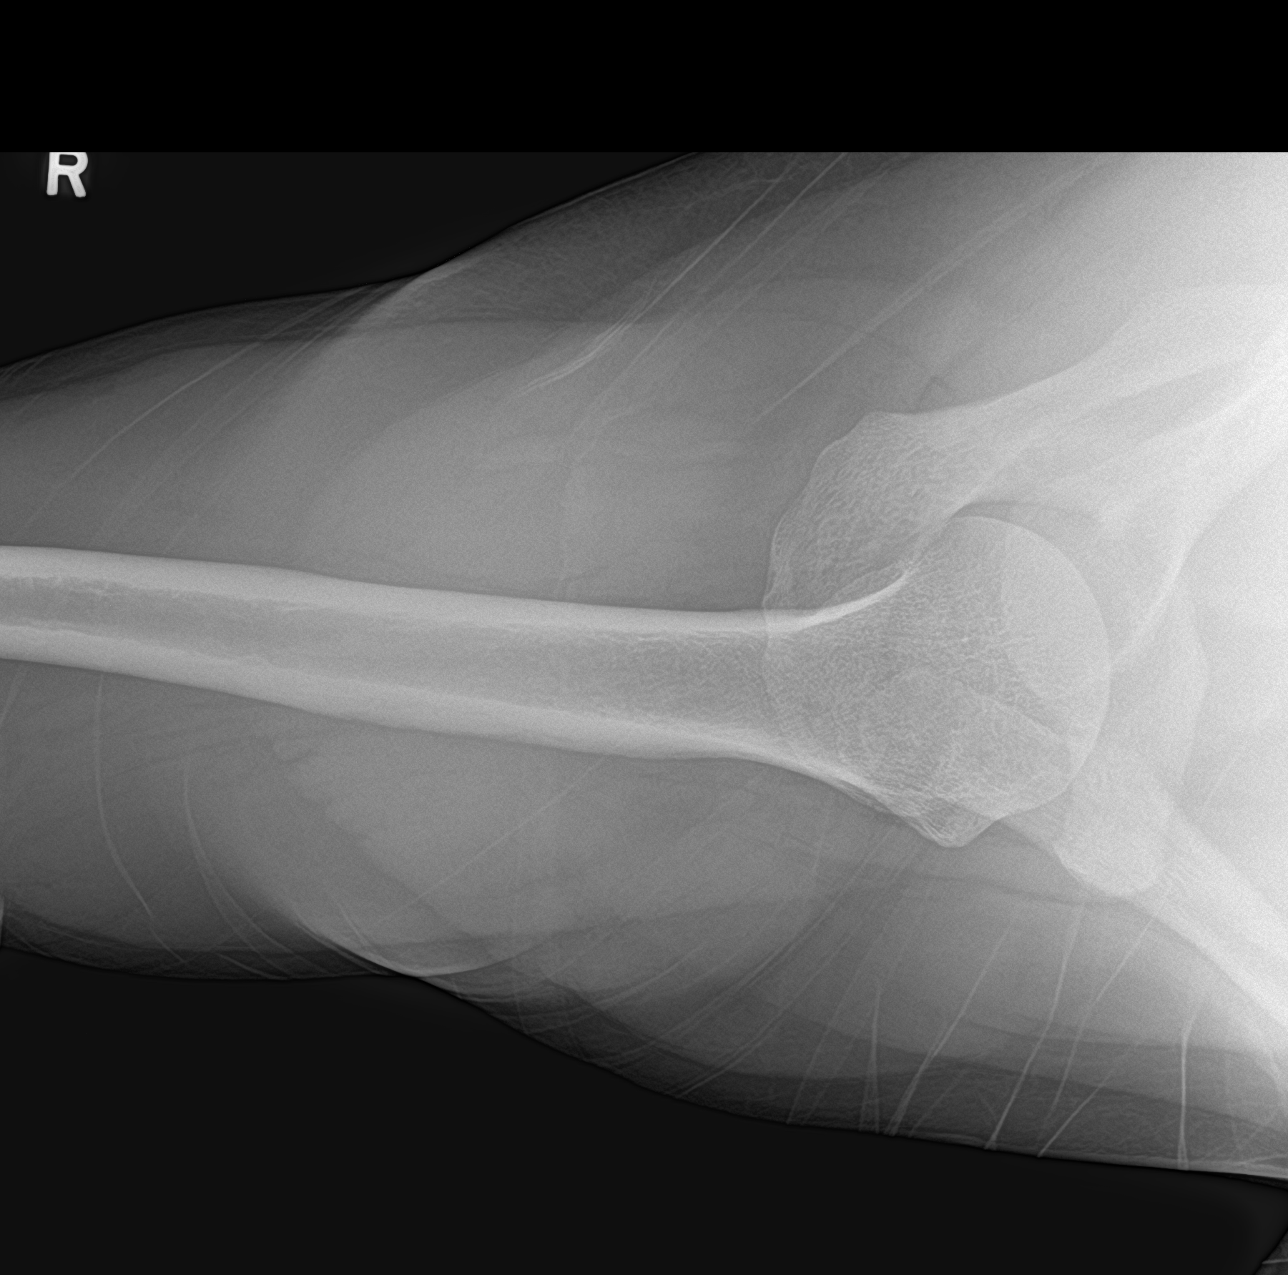

[3 of 3 positions shown; findings below may reference images not displayed]

FINDINGS: No acute bony or joint abnormality identified. No evidence of
fracture dislocation.
IMPRESSION: Negative.

## 2015-03-12 MED ORDER — OXYCODONE HCL 5 MG PO TABS: ORAL_TABLET | ORAL | Status: DC

## 2015-03-12 NOTE — Progress Notes (Signed)
Subjective:    Patient ID: Shane Cole., male    DOB: 04/14/1980, 34 y.o.   MRN: [id number]  HPI: [name] is a 34 year old male who returns for follow up for chronic pain and medication refill. He says his pain is located in his right shoulder and left foot.He rates his pain 6. His current exercise regime walking, performing stretching exercises and  weight lifting  left upper extremity. S/P Right Shoulder Injection with minimum relief noted. Will order an Right shoulder X-ray today, he has PT evaluation today.  Pain Inventory Average Pain 7 Pain Right Now 6 My pain is sharp, burning, stabbing and aching  In the last 24 hours, has pain interfered with the following? General activity 5 Relation with others 3 Enjoyment of life 6 What TIME of day is your pain at its worst? evening and night Sleep (in general) Fair  Pain is worse with: walking, bending, standing and some activites Pain improves with: rest, heat/ice, therapy/exercise, pacing activities, medication and injections Relief from Meds: 4  Mobility walk without assistance use a cane how many minutes can you walk? 30  Function employed # of hrs/week 50  Neuro/Psych weakness trouble walking  Prior Studies Any changes since last visit?  no  Physicians involved in your care Any changes since last visit?  no   Family History  Problem Relation Age of Onset  . Hyperlipidemia Father   . Hypertension Father   . Anxiety disorder Father   . Drug abuse Father   . Cancer Paternal Grandfather     lung, colon  . Colon cancer Paternal Grandfather   . Lung cancer Paternal Grandfather   . Stroke    . Heart disease    . Diabetes Paternal Grandmother   . Cancer Paternal Grandmother   . Cancer Maternal Grandmother   . Cancer Maternal Grandfather   . Lung cancer Maternal Grandfather   . Depression Paternal Aunt   . Anxiety disorder Paternal Aunt   . Drug abuse Paternal Uncle    Social History    Social History  . Marital Status: Married    Spouse Name: N/A  . Number of Children: 1  . Years of Education: 10th grade   Occupational History  . Disabled     Previously worked Web designer   Social History Main Topics  . Smoking status: Current Every Day Smoker -- 0.50 packs/day for 23 years    Types: Cigarettes  . Smokeless tobacco: Never Used  . Alcohol Use: 3.6 oz/week    6 Cans of beer per week     Comment: occasional  . Drug Use: No  . Sexual Activity:    Partners: Female   Other Topics Concern  . None   Social History Narrative   05/24/12  Hercules was born and grew up in [place], [place]. He has 2 sisters. He reports that his childhood was "lousy." He completed the 10th grade. He has been married for 5 years, and is currently separated for 3 weeks. He has a daughter who is 12-1/2 years old. He has been unemployed for 2 years, and is disabled due to an on-the-job work accident. He is currently living with his aunt and cousin. He reports that his hobbies are sports and dogs. He reports that he is spiritual, but not religious. He states that his aunt and his father are his social support system. He denies any current legal problems, but got a DUI  in 2007. 05/24/12 AHW         Past Surgical History  Procedure Laterality Date  . Mass excision Left 01/25/2013    Procedure: EXCISION MASS DORSAL ASPECT LEFT LONG FINGER DISTAL INTERPHALANGEAL JOINT;  Surgeon: Wyn Forsterobert V Sypher Jr., MD;  Location: [facility];  Service: Orthopedics;  Laterality: Left;  Left long   . Mouth surgery    . Hand surgery    . Colonoscopy    . Cholecystectomy N/A 01/24/2014    Procedure: LAPAROSCOPIC CHOLECYSTECTOMY;  Surgeon: Axel FillerArmando Ramirez, MD;  [facility];  Service: General;  Laterality: N/A;   Past Medical History  Diagnosis Date  . Nerve pain   . Crush injury lower leg     Left lower leg  . Depression   . Migraine   . Bipolar disorder (HCC)   . Allergy    . Anxiety   . Gastric ulcer   . GERD (gastroesophageal reflux disease)     will awaken him in night  . RSD (reflex sympathetic dystrophy)    BP 137/77 mmHg  Pulse 95  SpO2 99%  Opioid Risk Score:   Fall Risk Score:  `1  Depression screen PHQ 2/9  [facility] 12/08/2014 12/05/2014 10/26/2014 10/25/2014 10/12/2014 10/12/2014 09/30/2014  Decreased Interest 0 0 0 0 0 0 0  Down, Depressed, Hopeless 0 0 0 0 0 0 0  PHQ - 2 Score 0 0 0 0 0 0 0  Altered sleeping - - - - 2 - -  Tired, decreased energy - - - - 0 - -  Change in appetite - - - - 1 - -  Feeling bad or failure about yourself  - - - - 0 - -  Trouble concentrating - - - - 0 - -  Moving slowly or fidgety/restless - - - - 0 - -  Suicidal thoughts - - - - 0 - -  PHQ-9 Score - - - - 3 - -     Review of Systems  Gastrointestinal: Positive for nausea.  Musculoskeletal: Positive for gait problem.  Neurological: Positive for weakness.  All other systems reviewed and are negative.      Objective:   Physical Exam  Constitutional: He is oriented to person, place, and time. He appears well-developed and well-nourished.  HENT:  Head: Normocephalic and atraumatic.  Neck: Normal range of motion. Neck supple.  Cardiovascular: Normal rate and regular rhythm.   Pulmonary/Chest: Effort normal and breath sounds normal.  Musculoskeletal:  Normal Muscle Bulk and Muscle Testing Reveals: Upper Extremities: Right: Decreased ROM 45 Degrees and Muscle Strength 4/5 Right AC Joint Tenderness/ Right Rhomboid Tenderness Left: Full ROM and Muscle Strength 5/5 Lower Extremities: Full ROM and Muscle Strength 5/5 Arises from chair with ease Narrow Based Gait  Neurological: He is alert and oriented to person, place, and time.  Skin: Skin is warm and dry.  Psychiatric: He has a normal mood and affect.  Nursing note and vitals reviewed.         Assessment & Plan:  1 Reflex Sympathetic Dystrophy related to crush injury left foot. 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 Refilled: Oxycodone 5 mg daily #30. Continue Tramadol. Continue with Ice and Exercise Regime. 2. Right Shoulder Pain: RX: X-ray. PT evaluation today  20 minutes of face to face patient care time was spent during this visit. All questions were encouraged answered.  F/U in 1 month

## 2015-03-14 ENCOUNTER — Telehealth: Payer: Self-pay | Admitting: Physical Medicine & Rehabilitation

## 2015-03-14 NOTE — Telephone Encounter (Signed)
Per Riley LamEunice. I have notified [name] that his x ray is normal.

## 2015-03-14 NOTE — Telephone Encounter (Signed)
Patient had some Xray's done and would like to know the results of them.  Please call patient.

## 2015-04-09 ENCOUNTER — Encounter: Payer: 59 | Attending: Physical Medicine & Rehabilitation | Admitting: Registered Nurse

## 2015-04-09 ENCOUNTER — Encounter: Payer: Self-pay | Admitting: Registered Nurse

## 2015-04-09 VITALS — BP 126/97 | HR 99

## 2015-04-09 DIAGNOSIS — M25511 Pain in right shoulder: Secondary | ICD-10-CM

## 2015-04-09 DIAGNOSIS — Z5181 Encounter for therapeutic drug level monitoring: Secondary | ICD-10-CM | POA: Insufficient documentation

## 2015-04-09 DIAGNOSIS — G90522 Complex regional pain syndrome I of left lower limb: Secondary | ICD-10-CM | POA: Diagnosis not present

## 2015-04-09 DIAGNOSIS — G894 Chronic pain syndrome: Secondary | ICD-10-CM | POA: Insufficient documentation

## 2015-04-09 DIAGNOSIS — Z79899 Other long term (current) drug therapy: Secondary | ICD-10-CM | POA: Insufficient documentation

## 2015-04-09 DIAGNOSIS — N4340 Spermatocele of epididymis, unspecified: Secondary | ICD-10-CM | POA: Diagnosis not present

## 2015-04-09 DIAGNOSIS — M89062 Algoneurodystrophy, left lower leg: Secondary | ICD-10-CM | POA: Insufficient documentation

## 2015-04-09 DIAGNOSIS — Z0000 Encounter for general adult medical examination without abnormal findings: Secondary | ICD-10-CM | POA: Diagnosis not present

## 2015-04-09 MED ORDER — OXYCODONE HCL 5 MG PO TABS
ORAL_TABLET | ORAL | Status: DC
Start: 2015-04-09 — End: 2015-05-07

## 2015-04-09 NOTE — Progress Notes (Signed)
Subjective:    Patient ID: Shane LandryPaul D Rivenbark Jr., male    DOB: 01/28/1981, 35 y.o.   MRN: [id number]  HPI: Shane Cole is a 35 year old male who returns for follow up for chronic pain and medication refill. He says his pain is located in his right shoulder and left foot.He rates his pain 6. His current exercise regime is walking, performing stretching exercises using the bands.  Pain Inventory Average Pain 6 Pain Right Now 6 My pain is sharp, burning, stabbing and aching  In the last 24 hours, has pain interfered with the following? General activity 0 Relation with others 0 Enjoyment of life 0 What TIME of day is your pain at its worst? varies Sleep (in general) NA  Pain is worse with: walking, bending, inactivity, standing and some activites Pain improves with: rest, pacing activities, medication and injections Relief from Meds: 4  Mobility use a cane how many minutes can you walk? 30-45  Function employed # of hrs/week 40  Neuro/Psych weakness trouble walking  Prior Studies Any changes since last visit?  no  Physicians involved in your care Any changes since last visit?  no   Family History  Problem Relation Age of Onset  . Hyperlipidemia Father   . Hypertension Father   . Anxiety disorder Father   . Drug abuse Father   . Cancer Paternal Grandfather     lung, colon  . Colon cancer Paternal Grandfather   . Lung cancer Paternal Grandfather   . Stroke    . Heart disease    . Diabetes Paternal Grandmother   . Cancer Paternal Grandmother   . Cancer Maternal Grandmother   . Cancer Maternal Grandfather   . Lung cancer Maternal Grandfather   . Depression Paternal Aunt   . Anxiety disorder Paternal Aunt   . Drug abuse Paternal Uncle    Social History   Social History  . Marital Status: Married    Spouse Name: N/A  . Number of Children: 1  . Years of Education: 10th grade   Occupational History  . Disabled     Previously worked Garment/textile technologistinstalling  granite counter tops   Social History Main Topics  . Smoking status: Current Every Day Smoker -- 0.50 packs/day for 23 years    Types: Cigarettes  . Smokeless tobacco: Never Used  . Alcohol Use: 3.6 oz/week    6 Cans of beer per week     Comment: occasional  . Drug Use: No  . Sexual Activity:    Partners: Female   Other Topics Concern  . None   Social History Narrative   05/24/12  Shane Cole was born [place] He has 2 sisters. He reports that his childhood was "lousy." He completed the 10th grade. He has been married for 5 years, and is currently separated for 3 weeks. He has a daughter who is 623-1/48 years old. He has been unemployed for 2 years, and is disabled due to an on-the-job work accident. He is currently living with his aunt and cousin. He reports that his hobbies are sports and dogs. He reports that he is spiritual, but not religious. He states that his aunt and his father are his social support system. He denies any current legal problems, but got a DUI in 2007. 05/24/12 AHW         Past Surgical History  Procedure Laterality Date  . Mass excision Left 01/25/2013    Procedure: EXCISION  MASS DORSAL ASPECT LEFT LONG FINGER DISTAL INTERPHALANGEAL JOINT;  Surgeon: Wyn Forster., MD;  Location: [facility];  Service: Orthopedics;  Laterality: Left;  Left long   . Mouth surgery    . Hand surgery    . Colonoscopy    . Cholecystectomy N/A 01/24/2014    Procedure: LAPAROSCOPIC CHOLECYSTECTOMY;  Surgeon: Axel Filler, MD;  [facility];  Service: General;  Laterality: N/A;   Past Medical History  Diagnosis Date  . Nerve pain   . Crush injury lower leg     Left lower leg  . Depression   . Migraine   . Bipolar disorder (HCC)   . Allergy   . Anxiety   . Gastric ulcer   . GERD (gastroesophageal reflux disease)     will awaken him in night  . RSD (reflex sympathetic dystrophy)    BP 126/97 mmHg  Pulse 99  SpO2 99%  Opioid Risk  Score:   Fall Risk Score:  `1  Depression screen PHQ 2/9  Depression screen [facility] 2/9 04/09/2015 12/08/2014 12/05/2014 10/26/2014 10/25/2014 10/12/2014 10/12/2014  Decreased Interest 0 0 0 0 0 0 0  Down, Depressed, Hopeless 0 0 0 0 0 0 0  PHQ - 2 Score 0 0 0 0 0 0 0  Altered sleeping - - - - - 2 -  Tired, decreased energy - - - - - 0 -  Change in appetite - - - - - 1 -  Feeling bad or failure about yourself  - - - - - 0 -  Trouble concentrating - - - - - 0 -  Moving slowly or fidgety/restless - - - - - 0 -  Suicidal thoughts - - - - - 0 -  PHQ-9 Score - - - - - 3 -    Review of Systems  Gastrointestinal: Positive for nausea, vomiting and abdominal pain.  All other systems reviewed and are negative.      Objective:   Physical Exam  Constitutional: He is oriented to person, place, and time. He appears well-developed and well-nourished.  HENT:  Head: Normocephalic and atraumatic.  Neck: Normal range of motion. Neck supple.  Cardiovascular: Normal rate and regular rhythm.   Pulmonary/Chest: Effort normal and breath sounds normal.  Musculoskeletal:  Normal Muscle Bulk and Muscle Testing Reveals: Upper Extremities: Right: Decreased ROM 45 Degrees and Muscle Strength 5/5 Right AC Joint Tenderness Left: Full ROM and Muscle Strength 5/5 Thoracic Paraspinal Tenderness: T-10- T-11 Lower Extremities: Full ROM and Muscle Strength 5/5 Arises from chair with ease Narrow Based gait   Neurological: He is alert and oriented to person, place, and time.  Skin: Skin is warm and dry.  Psychiatric: He has a normal mood and affect.  Nursing note and vitals reviewed.         Assessment & Plan:  1 Reflex Sympathetic Dystrophy related to crush injury left foot. 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 Refilled: Oxycodone 5 mg daily #30. Continue Tramadol. Continue with Ice and Exercise Regime. 2. Right Shoulder Pain: Continue HEP  20 minutes of face to face patient care time was  spent during this visit. All questions were encouraged answered.  F/U in 1 month

## 2015-04-18 ENCOUNTER — Telehealth: Payer: 59 | Admitting: Family

## 2015-04-18 DIAGNOSIS — R6889 Other general symptoms and signs: Secondary | ICD-10-CM | POA: Diagnosis not present

## 2015-04-18 MED ORDER — OSELTAMIVIR PHOSPHATE 75 MG PO CAPS: 75.0000 mg | ORAL_CAPSULE | Freq: Two times a day (BID) | ORAL | Status: DC

## 2015-04-18 NOTE — Progress Notes (Signed)

## 2015-05-07 ENCOUNTER — Ambulatory Visit (HOSPITAL_BASED_OUTPATIENT_CLINIC_OR_DEPARTMENT_OTHER): Payer: 59 | Admitting: Physical Medicine & Rehabilitation

## 2015-05-07 ENCOUNTER — Encounter: Payer: 59 | Attending: Physical Medicine & Rehabilitation

## 2015-05-07 ENCOUNTER — Encounter: Payer: Self-pay | Admitting: Physical Medicine & Rehabilitation

## 2015-05-07 VITALS — BP 147/84 | HR 104 | Resp 14

## 2015-05-07 DIAGNOSIS — G90522 Complex regional pain syndrome I of left lower limb: Secondary | ICD-10-CM

## 2015-05-07 DIAGNOSIS — Z5181 Encounter for therapeutic drug level monitoring: Secondary | ICD-10-CM | POA: Diagnosis not present

## 2015-05-07 DIAGNOSIS — Z79899 Other long term (current) drug therapy: Secondary | ICD-10-CM | POA: Diagnosis not present

## 2015-05-07 DIAGNOSIS — G894 Chronic pain syndrome: Secondary | ICD-10-CM | POA: Diagnosis not present

## 2015-05-07 DIAGNOSIS — M89062 Algoneurodystrophy, left lower leg: Secondary | ICD-10-CM | POA: Insufficient documentation

## 2015-05-07 DIAGNOSIS — M7501 Adhesive capsulitis of right shoulder: Secondary | ICD-10-CM

## 2015-05-07 DIAGNOSIS — M67911 Unspecified disorder of synovium and tendon, right shoulder: Secondary | ICD-10-CM

## 2015-05-07 MED ORDER — IBUPROFEN 800 MG PO TABS: 800.0000 mg | ORAL_TABLET | Freq: Two times a day (BID) | ORAL | Status: DC | PRN

## 2015-05-07 MED ORDER — OXYCODONE HCL 5 MG PO TABS: ORAL_TABLET | ORAL | Status: DC

## 2015-05-07 NOTE — Progress Notes (Signed)
Subjective:    Patient ID: Shane Landry., male    DOB: Jan 01, 1981, 35 y.o.   MRN: [id number] 35 year old male who had a work-related injury in 2005. A chunk of Gran[place] fell on his left foot, sustained multiple distal foot fractures at the medical tarsals as well as phalanges Was immobilized in a boot for about 6 months.No operative treatment Patient remembers being diagnosed by Dr. Farris Has with reflex sympathetic dystrophy around 2007 Patient went to [facility], Dr. Roderic Ovens and received sympathetic blocks Patient also tried on various pain medications such as oxycodone, fentanyl, OxyContin Also tried gabapentin, nortriptyline and Lidoderm Has good days and bad days, on his bad days he walks with a cane. He states he has gained about 90 pounds since his injury Patient has 2 types of pain he has his RSD type pain where he has swelling and tenderness over the foot which hurts when he walks and then he also has a sharper pain which she describes as bone pain affecting his second and third toes Has also had subsequent fractures in the toes which have healed except for one persistent pack sure seen at the base of the second toe phalange   HPI Chief complaint today is shoulder pain. His pain is mainly with abduction. He is doing some exercises with theraband which helped partially. He states he gets good relief with injections. Prior injections were in June 2016 as well as in November 2016.  In addition he has his chronic Left lower extremity RSD pain. He's had good relief with oxycodone but has some days perhaps 10 month when the one tablet is not enough. Also would like to discontinue Mobic and resume ibuprofen 800 mg twice a day  Pain Inventory Average Pain 6 Pain Right Now 5 My pain is sharp, burning, stabbing and aching  In the last 24 hours, has pain interfered with the following? General activity 4 Relation with others 4 Enjoyment of life 4 What TIME of day is your  pain at its worst? daytime Sleep (in general) Fair  Pain is worse with: walking, bending, inactivity, standing and some activites Pain improves with: rest, heat/ice, pacing activities, medication and injections Relief from Meds: 4  Mobility walk without assistance walk with assistance use a cane how many minutes can you walk? 45 ability to climb steps?  yes do you drive?  yes Do you have any goals in this area?  yes  Function employed # of hrs/week 40  Neuro/Psych weakness trouble walking  Prior Studies Any changes since last visit?  no  Physicians involved in your care Any changes since last visit?  no   Family History  Problem Relation Age of Onset  . Hyperlipidemia Father   . Hypertension Father   . Anxiety disorder Father   . Drug abuse Father   . Cancer Paternal Grandfather     lung, colon  . Colon cancer Paternal Grandfather   . Lung cancer Paternal Grandfather   . Stroke    . Heart disease    . Diabetes Paternal Grandmother   . Cancer Paternal Grandmother   . Cancer Maternal Grandmother   . Cancer Maternal Grandfather   . Lung cancer Maternal Grandfather   . Depression Paternal Aunt   . Anxiety disorder Paternal Aunt   . Drug abuse Paternal Uncle    Social History   Social History  . Marital Status: Married    Spouse Name: N/A  . Number of Children: 1  .  Years of Education: 10th grade   Occupational History  . Disabled     Previously worked Web designer   Social History Main Topics  . Smoking status: Current Every Day Smoker -- 0.50 packs/day for 23 years    Types: Cigarettes  . Smokeless tobacco: Never Used  . Alcohol Use: 3.6 oz/week    6 Cans of beer per week     Comment: occasional  . Drug Use: No  . Sexual Activity:    Partners: Female   Other Topics Concern  . None   Social History Narrative   05/24/12  Shane Cole was born and grew up in [place], [place]. He has 2 sisters. He reports that his childhood was  "lousy." He completed the 10th grade. He has been married for 5 years, and is currently separated for 3 weeks. He has a daughter who is 42-1/2 years old. He has been unemployed for 2 years, and is disabled due to an on-the-job work accident. He is currently living with his aunt and cousin. He reports that his hobbies are sports and dogs. He reports that he is spiritual, but not religious. He states that his aunt and his father are his social support system. He denies any current legal problems, but got a DUI in 2007. 05/24/12 AHW         Past Surgical History  Procedure Laterality Date  . Mass excision Left 01/25/2013    Procedure: EXCISION MASS DORSAL ASPECT LEFT LONG FINGER DISTAL INTERPHALANGEAL JOINT;  Surgeon: Wyn Forster., MD;  Location: [facility];  Service: Orthopedics;  Laterality: Left;  Left long   . Mouth surgery    . Hand surgery    . Colonoscopy    . Cholecystectomy N/A 01/24/2014    Procedure: LAPAROSCOPIC CHOLECYSTECTOMY;  Surgeon: Axel Filler, MD;  Location: [facility];  Service: General;  Laterality: N/A;   Past Medical History  Diagnosis Date  . Nerve pain   . Crush injury lower leg     Left lower leg  . Depression   . Migraine   . Bipolar disorder (HCC)   . Allergy   . Anxiety   . Gastric ulcer   . GERD (gastroesophageal reflux disease)     will awaken him in night  . RSD (reflex sympathetic dystrophy)    BP 147/84 mmHg  Pulse 104  Resp 14  SpO2 99%  Opioid Risk Score:   Fall Risk Score:  `1  Depression screen PHQ 2/9  Depression screen [facility] 2/9 04/09/2015 12/08/2014 12/05/2014 10/26/2014 10/25/2014 10/12/2014 10/12/2014  Decreased Interest 0 0 0 0 0 0 0  Down, Depressed, Hopeless 0 0 0 0 0 0 0  PHQ - 2 Score 0 0 0 0 0 0 0  Altered sleeping - - - - - 2 -  Tired, decreased energy - - - - - 0 -  Change in appetite - - - - - 1 -  Feeling bad or failure about yourself  - - - - - 0 -  Trouble concentrating - - - - - 0 -  Moving slowly or  fidgety/restless - - - - - 0 -  Suicidal thoughts - - - - - 0 -  PHQ-9 Score - - - - - 3 -     Review of Systems  Constitutional: Positive for unexpected weight change.  Gastrointestinal: Positive for diarrhea.  All other systems reviewed and are negative.      Objective:  Physical Exam  Constitutional: He is oriented to person, place, and time. He appears well-developed and well-nourished.  HENT:  Head: Normocephalic and atraumatic.  Eyes: Conjunctivae and EOM are normal. Pupils are equal, round, and reactive to light.  Neck: Normal range of motion.  Neurological: He is alert and oriented to person, place, and time.  Psychiatric: He has a normal mood and affect.  Nursing note and vitals reviewed.   Patient has positive impingement sign at 90 of the right shoulder. Has limited internal and external rotation with end range pain. There is mild atrophy in the suprascapular area of the  No numbness or tingling in the right arm       Assessment & Plan:   1. Reflex Sympathetic dystrophy left lower extremity remote trauma. 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  2. Right shoulder pain this predated his workers comp injury. He does have a history of rotator cuff syndrome. He gets good relief with injections. I suspect he may have underlying tear or rotator cuff arthropathy.  He is also starting to develop contracture of the  right shoulder which is likely due to frozen shoulder. For that reason rather than injecting the subacromial area, we will inject the glenohumeral area  Shoulder injection Right glenohumeral Without ultrasound guidance)  Indication:Right frozen shoulder and right chronic Shoulder pain not relieved by medication management and other conservative care.  Informed consent was obtained after describing risks and benefits of the procedure with the patient, this includes bleeding, bruising, infection and medication side effects. The patient wishes to proceed and has given written consent. Patient was placed in a seated position. The Right shoulder was marked and prepped with betadine in the subacromial area. A 25-gauge 1-1/2 inch needle was inserted targeting the ipsilateral coracoid process. After negative draw back for blood, a solution containing 1 mL of 6 mg per ML betamethasone and 4 mL of 1% lidocaine was injected. A band aid was applied. The patient tolerated the procedure well. Post procedure instructions were given.  If shoulder injections become less helpful for pain increases would check ultrasound left shoulder Also recommend physical therapy have written order. Patient does work full-time so this may be an issue

## 2015-05-07 NOTE — Patient Instructions (Signed)
Referral to PT made,  Consider ultrasound to Right shoulder if not much better next visit with me 3 mo

## 2015-05-13 LAB — TOXASSURE SELECT,+ANTIDEPR,UR: PDF: 0

## 2015-05-14 NOTE — Progress Notes (Addendum)
Urine drug screen for this encounter is consistent for prescribed medication (last taken 05/03/15 oxycodone and tramadol 05/05/15)  Positive for alcohol. We will send formal warning letter for aldohol per Dr Wynn Banker

## 2015-06-04 ENCOUNTER — Encounter: Payer: Self-pay | Admitting: Registered Nurse

## 2015-06-04 ENCOUNTER — Encounter: Payer: 59 | Attending: Physical Medicine & Rehabilitation | Admitting: Registered Nurse

## 2015-06-04 VITALS — BP 141/70 | HR 100 | Resp 14

## 2015-06-04 DIAGNOSIS — G90522 Complex regional pain syndrome I of left lower limb: Secondary | ICD-10-CM | POA: Diagnosis not present

## 2015-06-04 DIAGNOSIS — M89062 Algoneurodystrophy, left lower leg: Secondary | ICD-10-CM | POA: Diagnosis not present

## 2015-06-04 DIAGNOSIS — G894 Chronic pain syndrome: Secondary | ICD-10-CM | POA: Diagnosis not present

## 2015-06-04 DIAGNOSIS — Z5181 Encounter for therapeutic drug level monitoring: Secondary | ICD-10-CM | POA: Insufficient documentation

## 2015-06-04 DIAGNOSIS — M7501 Adhesive capsulitis of right shoulder: Secondary | ICD-10-CM | POA: Diagnosis not present

## 2015-06-04 DIAGNOSIS — Z79899 Other long term (current) drug therapy: Secondary | ICD-10-CM | POA: Diagnosis not present

## 2015-06-04 MED ORDER — OXYCODONE HCL 5 MG PO TABS: ORAL_TABLET | ORAL | Status: DC

## 2015-06-04 MED ORDER — TRAMADOL HCL 50 MG PO TABS: 50.0000 mg | ORAL_TABLET | Freq: Two times a day (BID) | ORAL | Status: DC | PRN

## 2015-06-04 NOTE — Progress Notes (Signed)
Subjective:    Patient ID: Shane Cole., male    DOB: 14-Dec-1980, 35 y.o.   MRN: [id number]  HPI: [name] is a 35 year old male who returns for follow up for chronic pain and medication refill. He states his pain is located in his right shoulder and left foot.He rates his pain 2. His current exercise regime is attending [employer] every other day and walking, performing stretching exercises using the bands. S/P Right Shoulder Injection with some relief noted. [name] forgot his oxycodone according to [facility] medication was picked up 05/07/15. He also states he is taking his Tramadol every 12 hours.  Pain Inventory Average Pain 4 Pain Right Now 2 My pain is sharp, burning, stabbing and aching  In the last 24 hours, has pain interfered with the following? General activity 2 Relation with others 0 Enjoyment of life 6 What TIME of day is your pain at its worst? daytime Sleep (in general) Fair  Pain is worse with: walking, inactivity, standing and some activites Pain improves with: rest, heat/ice, therapy/exercise, pacing activities, medication and injections Relief from Meds: 8  Mobility walk with assistance how many minutes can you walk? 30-45 Do you have any goals in this area?  yes  Function employed # of hrs/week 40  Neuro/Psych weakness tingling  Prior Studies Any changes since last visit?  no  Physicians involved in your care Any changes since last visit?  no   Family History  Problem Relation Age of Onset  . Hyperlipidemia Father   . Hypertension Father   . Anxiety disorder Father   . Drug abuse Father   . Cancer Paternal Grandfather     lung, colon  . Colon cancer Paternal Grandfather   . Lung cancer Paternal Grandfather   . Stroke    . Heart disease    . Diabetes Paternal Grandmother   . Cancer Paternal Grandmother   . Cancer Maternal Grandmother   . Cancer Maternal Grandfather   . Lung cancer Maternal Grandfather   .  Depression Paternal Aunt   . Anxiety disorder Paternal Aunt   . Drug abuse Paternal Uncle    Social History   Social History  . Marital Status: Married    Spouse Name: N/A  . Number of Children: 1  . Years of Education: 10th grade   Occupational History  . Disabled     Previously worked Web designer   Social History Main Topics  . Smoking status: Current Every Day Smoker -- 0.50 packs/day for 23 years    Types: Cigarettes  . Smokeless tobacco: Never Used  . Alcohol Use: 3.6 oz/week    6 Cans of beer per week     Comment: occasional  . Drug Use: No  . Sexual Activity:    Partners: Female   Other Topics Concern  . None   Social History Narrative   05/24/12  Shane Cole was born and grew up in [place], [place]. He has 2 sisters. He reports that his childhood was "lousy." He completed the 10th grade. He has been married for 5 years, and is currently separated for 3 weeks. He has a daughter who is 38-1/2 years old. He has been unemployed for 2 years, and is disabled due to an on-the-job work accident. He is currently living with his aunt and cousin. He reports that his hobbies are sports and dogs. He reports that he is spiritual, but not religious. He states that his  aunt and his father are his social support system. He denies any current legal problems, but got a DUI in 2007. 05/24/12 AHW         Past Surgical History  Procedure Laterality Date  . Mass excision Left 01/25/2013    Procedure: EXCISION MASS DORSAL ASPECT LEFT LONG FINGER DISTAL INTERPHALANGEAL JOINT;  Surgeon: Wyn Forsterobert V Sypher Jr., MD;  Location: [facility];  Service: Orthopedics;  Laterality: Left;  Left long   . Mouth surgery    . Hand surgery    . Colonoscopy    . Cholecystectomy N/A 01/24/2014    Procedure: LAPAROSCOPIC CHOLECYSTECTOMY;  Surgeon: Axel FillerArmando Ramirez, MD;  [facility];  Service: General;  Laterality: N/A;   Past Medical History  Diagnosis Date  . Nerve pain   .  Crush injury lower leg     Left lower leg  . Depression   . Migraine   . Bipolar disorder (HCC)   . Allergy   . Anxiety   . Gastric ulcer   . GERD (gastroesophageal reflux disease)     will awaken him in night  . RSD (reflex sympathetic dystrophy)    BP 141/70 mmHg  Pulse 100  Resp 14  SpO2 99%  Opioid Risk Score:   Fall Risk Score:  `1  Depression screen PHQ 2/9  Depression screen [facility] 2/9 06/04/2015 04/09/2015 12/08/2014 12/05/2014 10/26/2014 10/25/2014 10/12/2014  Decreased Interest 0 0 0 0 0 0 0  Down, Depressed, Hopeless 0 0 0 0 0 0 0  PHQ - 2 Score 0 0 0 0 0 0 0  Altered sleeping 0 - - - - - 2  Tired, decreased energy 0 - - - - - 0  Change in appetite 1 - - - - - 1  Feeling bad or failure about yourself  0 - - - - - 0  Trouble concentrating 0 - - - - - 0  Moving slowly or fidgety/restless 0 - - - - - 0  Suicidal thoughts 0 - - - - - 0  PHQ-9 Score 1 - - - - - 3     Review of Systems  Gastrointestinal: Positive for nausea and diarrhea.  Neurological: Positive for weakness.       Tingling   All other systems reviewed and are negative.      Objective:   Physical Exam  Constitutional: He is oriented to person, place, and time. He appears well-developed and well-nourished.  HENT:  Head: Normocephalic and atraumatic.  Neck: Normal range of motion. Neck supple.  Cardiovascular: Normal rate and regular rhythm.   Pulmonary/Chest: Effort normal and breath sounds normal.  Musculoskeletal:  Normal Muscle Bulk and Muscle Testing Reveals: Upper Extremities: Right: Decreased ROM 45 Degrees and Muscle Strength 5/5 Right AC Joint Tenderness Right Rhomboid Tenderness Lower Extremities: Full ROM and Muscle Strength 5/5 Arises from chair with ease Narrow Based gait  Neurological: He is alert and oriented to person, place, and time.  Skin: Skin is warm and dry.  Psychiatric: He has a normal mood and affect.  Nursing note and vitals reviewed.         Assessment & Plan:    1 Reflex Sympathetic Dystrophy related to crush injury left foot. 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 Refilled: Oxycodone 5 mg daily #40, may take two tablets a day when he pain is severe. Continue Tramadol. Continue with Ice and Exercise Regime. 2. Right Shoulder Pain: Continue HEP  20  minutes of face to face patient care time was spent during this visit. All questions were encouraged answered.  F/U in 1 month

## 2015-06-11 ENCOUNTER — Encounter (HOSPITAL_COMMUNITY): Payer: Self-pay | Admitting: Emergency Medicine

## 2015-06-11 ENCOUNTER — Emergency Department (HOSPITAL_COMMUNITY)
Admission: EM | Admit: 2015-06-11 | Discharge: 2015-06-11 | Disposition: A | Discharge status: Home / Self Care | Payer: 59 | Source: Home / Self Care | Attending: Family Medicine | Admitting: Family Medicine

## 2015-06-11 DIAGNOSIS — Z720 Tobacco use: Secondary | ICD-10-CM

## 2015-06-11 DIAGNOSIS — J9801 Acute bronchospasm: Secondary | ICD-10-CM | POA: Diagnosis not present

## 2015-06-11 DIAGNOSIS — R0982 Postnasal drip: Secondary | ICD-10-CM

## 2015-06-11 MED ORDER — IPRATROPIUM-ALBUTEROL 0.5-2.5 (3) MG/3ML IN SOLN
3.0000 mL | Freq: Once | RESPIRATORY_TRACT | Status: AC
  Administered 2015-06-11: 3 mL via RESPIRATORY_TRACT

## 2015-06-11 MED ORDER — PREDNISONE 20 MG PO TABS: ORAL_TABLET | ORAL | Status: DC

## 2015-06-11 MED ORDER — ALBUTEROL SULFATE HFA 108 (90 BASE) MCG/ACT IN AERS: 2.0000 | INHALATION_SPRAY | RESPIRATORY_TRACT | Status: DC | PRN

## 2015-06-11 MED ORDER — IPRATROPIUM-ALBUTEROL 0.5-2.5 (3) MG/3ML IN SOLN
RESPIRATORY_TRACT | Status: AC
  Filled 2015-06-11: qty 3

## 2015-06-11 NOTE — Discharge Instructions (Signed)
Bronchospasm, Adult A bronchospasm is a spasm or tightening of the airways going into the lungs. During a bronchospasm breathing becomes more difficult because the airways get smaller. When this happens there can be coughing, a whistling sound when breathing (wheezing), and difficulty breathing. Bronchospasm is often associated with asthma, but not all patients who experience a bronchospasm have asthma. CAUSES  A bronchospasm is caused by inflammation or irritation of the airways. The inflammation or irritation may be triggered by:   Allergies (such as to animals, pollen, food, or mold). Allergens that cause bronchospasm may cause wheezing immediately after exposure or many hours later.   Infection. Viral infections are believed to be the most common cause of bronchospasm.   Exercise.   Irritants (such as pollution, cigarette smoke, strong odors, aerosol sprays, and paint fumes).   Weather changes. Winds increase molds and pollens in the air. Rain refreshes the air by washing irritants out. Cold air may cause inflammation.   Stress and emotional upset.  SIGNS AND SYMPTOMS   Wheezing.   Excessive nighttime coughing.   Frequent or severe coughing with a simple cold.   Chest tightness.   Shortness of breath.  DIAGNOSIS  Bronchospasm is usually diagnosed through a history and physical exam. Tests, such as chest X-rays, are sometimes done to look for other conditions. TREATMENT   Inhaled medicines can be given to open up your airways and help you breathe. The medicines can be given using either an inhaler or a nebulizer machine.  Corticosteroid medicines may be given for severe bronchospasm, usually when it is associated with asthma. HOME CARE INSTRUCTIONS   Always have a plan prepared for seeking medical care. Know when to call your health care provider and local emergency services (911 in the [place] Know where you can access local emergency care.  Only take medicines as  directed by your health care provider.  If you were prescribed an inhaler or nebulizer machine, ask your health care provider to explain how to use it correctly. Always use a spacer with your inhaler if you were given one.  It is necessary to remain calm during an attack. Try to relax and breathe more slowly.  Control your home environment in the following ways:   Change your heating and air conditioning filter at least once a month.   Limit your use of fireplaces and wood stoves.  Do not smoke and do not allow smoking in your home.   Avoid exposure to perfumes and fragrances.   Get rid of pests (such as roaches and mice) and their droppings.   Throw away plants if you see mold on them.   Keep your house clean and dust free.   Replace carpet with wood, tile, or vinyl flooring. Carpet can trap dander and dust.   Use allergy-proof pillows, mattress covers, and box spring covers.   Wash bed sheets and blankets every week in hot water and dry them in a dryer.   Use blankets that are made of polyester or cotton.   Wash hands frequently. SEEK MEDICAL CARE IF:   You have muscle aches.   You have chest pain.   The sputum changes from clear or white to yellow, green, gray, or bloody.   The sputum you cough up gets thicker.   There are problems that may be related to the medicine you are given, such as a rash, itching, swelling, or trouble breathing.  SEEK IMMEDIATE MEDICAL CARE IF:   You have worsening wheezing and coughing  even after taking your prescribed medicines.   You have increased difficulty breathing.   You develop severe chest pain. MAKE SURE YOU:   Understand these instructions.  Will watch your condition.  Will get help right away if you are not doing well or get worse.   This information is not intended to replace advice given to you by your health care provider. Make sure you discuss any questions you have with your health care  provider.   Document Released: 03/13/2003 Document Revised: 03/31/2014 Document Reviewed: 08/30/2012 Elsevier Interactive Patient Education 2016 [employer].  How to Use an Inhaler Using your inhaler correctly is very important. Good technique will make sure that the medicine reaches your lungs.  HOW TO USE AN INHALER:  Take the cap off the inhaler.  If this is the first time using your inhaler, you need to prime it. Shake the inhaler for 5 seconds. Release four puffs into the air, away from your face. Ask your doctor for help if you have questions.  Shake the inhaler for 5 seconds.  Turn the inhaler so the bottle is above the mouthpiece.  Put your pointer finger on top of the bottle. Your thumb holds the bottom of the inhaler.  Open your mouth.  Either hold the inhaler away from your mouth (the width of 2 fingers) or place your lips tightly around the mouthpiece. Ask your doctor which way to use your inhaler.  Breathe out as much air as possible.  Breathe in and push down on the bottle 1 time to release the medicine. You will feel the medicine go in your mouth and throat.  Continue to take a deep breath in very slowly. Try to fill your lungs.  After you have breathed in completely, hold your breath for 10 seconds. This will help the medicine to settle in your lungs. If you cannot hold your breath for 10 seconds, hold it for as long as you can before you breathe out.  Breathe out slowly, through pursed lips. Whistling is an example of pursed lips.  If your doctor has told you to take more than 1 puff, wait at least 15-30 seconds between puffs. This will help you get the best results from your medicine. Do not use the inhaler more than your doctor tells you to.  Put the cap back on the inhaler.  Follow the directions from your doctor or from the inhaler package about cleaning the inhaler. If you use more than one inhaler, ask your doctor which inhalers to use and what order to  use them in. Ask your doctor to help you figure out when you will need to refill your inhaler.  If you use a steroid inhaler, always rinse your mouth with water after your last puff, gargle and spit out the water. Do not swallow the water. GET HELP IF:  The inhaler medicine only partially helps to stop wheezing or shortness of breath.  You are having trouble using your inhaler.  You have some increase in thick spit (phlegm). GET HELP RIGHT AWAY IF:  The inhaler medicine does not help your wheezing or shortness of breath or you have tightness in your chest.  You have dizziness, headaches, or fast heart rate.  You have chills, fever, or night sweats.  You have a large increase of thick spit, or your thick spit is bloody. MAKE SURE YOU:   Understand these instructions.  Will watch your condition.  Will get help right away if you are not  doing well or get worse. °  °This information is not intended to replace advice given to you by your health care provider. Make sure you discuss any questions you have with your health care provider. °  °Document Released: 12/18/2007 Document Revised: 12/29/2012 Document Reviewed: 10/07/2012 °Elsevier Interactive Patient Education ©2016 Elsevier Inc. ° °Smoking Cessation, Tips for Success °If you are ready to quit smoking, congratulations! You have chosen to help yourself be healthier. Cigarettes bring nicotine, tar, carbon monoxide, and other irritants into your body. Your lungs, heart, and blood vessels will be able to work better without these poisons. There are many different ways to quit smoking. Nicotine gum, nicotine patches, a nicotine inhaler, or nicotine nasal spray can help with physical craving. Hypnosis, support groups, and medicines help break the habit of smoking. °WHAT THINGS CAN I DO TO MAKE QUITTING EASIER?  °Here are some tips to help you quit for good: °· Pick a date when you will quit smoking completely. Tell all of your friends and family  about your plan to quit on that date. °· Do not try to slowly cut down on the number of cigarettes you are smoking. Pick a quit date and quit smoking completely starting on that day. °· Throw away all cigarettes.   °· Clean and remove all ashtrays from your home, work, and car. °· On a card, write down your reasons for quitting. Carry the card with you and read it when you get the urge to smoke. °· Cleanse your body of nicotine. Drink enough water and fluids to keep your urine clear or pale yellow. Do this after quitting to flush the nicotine from your body. °· Learn to predict your moods. Do not let a bad situation be your excuse to have a cigarette. Some situations in your life might tempt you into wanting a cigarette. °· Never have "just one" cigarette. It leads to wanting another and another. Remind yourself of your decision to quit. °· Change habits associated with smoking. If you smoked while driving or when feeling stressed, try other activities to replace smoking. Stand up when drinking your coffee. Brush your teeth after eating. Sit in a different chair when you read the paper. Avoid alcohol while trying to quit, and try to drink fewer caffeinated beverages. Alcohol and caffeine may urge you to smoke. °· Avoid foods and drinks that can trigger a desire to smoke, such as sugary or spicy foods and alcohol. °· Ask people who smoke not to smoke around you. °· Have something planned to do right after eating or having a cup of coffee. For example, plan to take a walk or exercise. °· Try a relaxation exercise to calm you down and decrease your stress. Remember, you may be tense and nervous for the first 2 weeks after you quit, but this will pass. °· Find new activities to keep your hands busy. Play with a pen, coin, or rubber band. Doodle or draw things on paper. °· Brush your teeth right after eating. This will help cut down on the craving for the taste of tobacco after meals. You can also try mouthwash.   °· Use  oral substitutes in place of cigarettes. Try using lemon drops, carrots, cinnamon sticks, or chewing gum. Keep them handy so they are available when you have the urge to smoke. °· When you have the urge to smoke, try deep breathing. °· Designate your home as a nonsmoking area. °· If you are a heavy smoker, ask your health care provider   about a prescription for nicotine chewing gum. It can ease your withdrawal from nicotine.  Reward yourself. Set aside the cigarette money you save and buy yourself something nice.  Look for support from others. Join a support group or smoking cessation program. Ask someone at home or at work to help you with your plan to quit smoking.  Always ask yourself, "Do I need this cigarette or is this just a reflex?" Tell yourself, "Today, I choose not to smoke," or "I do not want to smoke." You are reminding yourself of your decision to quit.  Do not replace cigarette smoking with electronic cigarettes (commonly called e-cigarettes). The safety of e-cigarettes is unknown, and some may contain harmful chemicals.  If you relapse, do not give up! Plan ahead and think about what you will do the next time you get the urge to smoke. HOW WILL I FEEL WHEN I QUIT SMOKING? You may have symptoms of withdrawal because your body is used to nicotine (the addictive substance in cigarettes). You may crave cigarettes, be irritable, feel very hungry, cough often, get headaches, or have difficulty concentrating. The withdrawal symptoms are only temporary. They are strongest when you first quit but will go away within 10-14 days. When withdrawal symptoms occur, stay in control. Think about your reasons for quitting. Remind yourself that these are signs that your body is healing and getting used to being without cigarettes. Remember that withdrawal symptoms are easier to treat than the major diseases that smoking can cause.  Even after the withdrawal is over, expect periodic urges to smoke. However,  these cravings are generally short lived and will go away whether you smoke or not. Do not smoke! WHAT RESOURCES ARE AVAILABLE TO HELP ME QUIT SMOKING? Your health care provider can direct you to community resources or hospitals for support, which may include:  Group support.  Education.  Hypnosis.  Therapy.   This information is not intended to replace advice given to you by your health care provider. Make sure you discuss any questions you have with your health care provider.   Document Released: 12/07/2003 Document Revised: 03/31/2014 Document Reviewed: 08/26/2012 Elsevier Interactive Patient Education [employer]2016 Elsevier Inc.

## 2015-06-11 NOTE — ED Notes (Signed)
Pt here with increasing SOB with activity and now with sitting or lying in bed Sx's started 1 month ago with dry cough 1 pack daily smoker with Hx Bronchitis Mother has COPD Tried Mucinex without relief Sats 93-94%RA, tachycardic noted Denies chest pain

## 2015-06-11 NOTE — ED Provider Notes (Signed)
CSN: [id number]     Arrival date & time 06/11/15  1905 History   First MD Initiated Contact with Patient 06/11/15 2045     Chief Complaint  Patient presents with  . Shortness of Breath   (Consider location/radiation/quality/duration/timing/severity/associated sxs/prior Treatment) HPI Comments: 35 year old male with a 23-pack-year history of smoking presents to the urgent care because of progressive shortness of breath over the past month. States he was active in intercourse this evening and had difficulty breathing. He also has parasternal chest pain associated with cough and deep breathing. Denies nasal discharge that he is clearing his throat frequently. He also has some sore throat.    Past Medical History  Diagnosis Date  . Nerve pain   . Crush injury lower leg     Left lower leg  . Depression   . Migraine   . Bipolar disorder (HCC)   . Allergy   . Anxiety   . Gastric ulcer   . GERD (gastroesophageal reflux disease)     will awaken him in night  . RSD (reflex sympathetic dystrophy)    Past Surgical History  Procedure Laterality Date  . Mass excision Left 01/25/2013    Procedure: EXCISION MASS DORSAL ASPECT LEFT LONG FINGER DISTAL INTERPHALANGEAL JOINT;  Surgeon: Wyn Forster., MD;  Location: [facility];  Service: Orthopedics;  Laterality: Left;  Left long   . Mouth surgery    . Hand surgery    . Colonoscopy    . Cholecystectomy N/A 01/24/2014    Procedure: LAPAROSCOPIC CHOLECYSTECTOMY;  Surgeon: Axel Filler, MD;  Location[facility];  Service: General;  Laterality: N/A;   Family History  Problem Relation Age of Onset  . Hyperlipidemia Father   . Hypertension Father   . Anxiety disorder Father   . Drug abuse Father   . Cancer Paternal Grandfather     lung, colon  . Colon cancer Paternal Grandfather   . Lung cancer Paternal Grandfather   . Stroke    . Heart disease    . Diabetes Paternal Grandmother   . Cancer Paternal Grandmother   . Cancer  Maternal Grandmother   . Cancer Maternal Grandfather   . Lung cancer Maternal Grandfather   . Depression Paternal Aunt   . Anxiety disorder Paternal Aunt   . Drug abuse Paternal Uncle    Social History  Substance Use Topics  . Smoking status: Current Every Day Smoker -- 1.00 packs/day for 23 years    Types: Cigarettes  . Smokeless tobacco: Never Used  . Alcohol Use: 3.6 oz/week    6 Cans of beer per week     Comment: occasional    Review of Systems  Constitutional: Positive for activity change. Negative for fever, diaphoresis and fatigue.  HENT: Positive for postnasal drip and sore throat. Negative for ear pain, facial swelling, rhinorrhea and trouble swallowing.   Eyes: Negative for pain, discharge and redness.  Respiratory: Positive for cough and shortness of breath. Negative for chest tightness.   Cardiovascular: Positive for chest pain.  Gastrointestinal: Negative.   Musculoskeletal: Negative.  Negative for neck pain and neck stiffness.  Neurological: Negative.   All other systems reviewed and are negative.   Allergies  Aspirin; Codeine; Penicillins; and Amitriptyline  Home Medications   Prior to Admission medications   Medication Sig Start Date End Date Taking? Authorizing Provider  albuterol (PROVENTIL HFA;VENTOLIN HFA) 108 (90 Base) MCG/ACT inhaler Inhale 2 puffs into the lungs every 4 (four) hours as needed for  wheezing or shortness of breath. 06/11/15   Hayden Rasmussen, NP  gabapentin (NEURONTIN) 600 MG tablet Take 1 tablet (600 mg total) by mouth 3 (three) times daily. 12/08/14   Erick Colace, MD  ibuprofen (ADVIL,MOTRIN) 800 MG tablet Take 1 tablet (800 mg total) by mouth 2 (two) times daily as needed for moderate pain. 05/07/15   Erick Colace, MD  lidocaine (XYLOCAINE) 5 % ointment Apply 1 application topically as needed. 01/08/15   Jones Bales, NP  Multiple Vitamin (MULTIVITAMIN WITH MINERALS) TABS tablet Take 1 tablet by mouth daily.    Historical  Provider, MD  oxyCODONE (OXY IR/ROXICODONE) 5 MG immediate release tablet Take 1 tablet AS NEEDED daily for severe pain 06/04/15   Jones Bales, NP  pantoprazole (PROTONIX) 40 MG tablet Take 30 minutes before breakfast and before supper. 02/13/14   Rachael Fee, MD  predniSONE (DELTASONE) 20 MG tablet 3 Tabs PO Days 1-3, then 2 tabs PO Days 4-6, then 1 tab PO Day 7-9, then Half Tab PO Day 10-12 06/11/15   Hayden Rasmussen, NP  ranitidine (ZANTAC) 150 MG tablet Take 1 tablet (150 mg total) by mouth at bedtime. 12/30/13   Thao P Le, DO  traMADol (ULTRAM) 50 MG tablet Take 1 tablet (50 mg total) by mouth every 12 (twelve) hours as needed. for pain 06/04/15   Jones Bales, NP  traZODone (DESYREL) 50 MG tablet TAKE 1/2 TO 1 TABLET BY MOUTH AT BEDTIME AS NEEDED FOR SLEEP 02/20/15   Erick Colace, MD   Meds Ordered and Administered this Visit   Medications  ipratropium-albuterol (DUONEB) 0.5-2.5 (3) MG/3ML nebulizer solution 3 mL (3 mLs Nebulization Given 06/11/15 2128)    BP 135/86 mmHg  Pulse 86  Temp(Src) 98.3 F (36.8 C) (Oral)  SpO2 94% No data found.   Physical Exam  Constitutional: He is oriented to person, place, and time. He appears well-developed and well-nourished. No distress.  HENT:  Oropharynx with clear PND. Minor erythema and cobblestoning.  Eyes: Conjunctivae and EOM are normal.  Neck: Normal range of motion. Neck supple.  Cardiovascular: Normal rate, regular rhythm and normal heart sounds.   Pulmonary/Chest: Effort normal. No respiratory distress. He has no rales.  Bilateral expiratory wheezing. Prolonged expiratory phase. Coughing spasms with deep inspiration.  Musculoskeletal: Normal range of motion. He exhibits no edema.  Lymphadenopathy:    He has no cervical adenopathy.  Neurological: He is alert and oriented to person, place, and time. He exhibits normal muscle tone.  Skin: Skin is warm and dry. No rash noted.  Psychiatric: He has a normal mood and affect.   Nursing note and vitals reviewed.   ED Course  Procedures (including critical care time)  Labs Review Labs Reviewed - No data to display  Imaging Review No results found.   Visual Acuity Review  Right Eye Distance:   Left Eye Distance:   Bilateral Distance:    Right Eye Near:   Left Eye Near:    Bilateral Near:         MDM   1. Cough due to bronchospasm   2. PND (post-nasal drip)   3. Tobacco abuse disorder    Meds ordered this encounter  Medications  . ipratropium-albuterol (DUONEB) 0.5-2.5 (3) MG/3ML nebulizer solution 3 mL    Sig:   . albuterol (PROVENTIL HFA;VENTOLIN HFA) 108 (90 Base) MCG/ACT inhaler    Sig: Inhale 2 puffs into the lungs every 4 (four) hours as needed for wheezing  or shortness of breath.    Dispense:  1 Inhaler    Refill:  0    Order Specific Question:  Supervising Provider    Answer:  Bradd CanaryKINDL, JAMES D [id number]]  . predniSONE (DELTASONE) 20 MG tablet    Sig: 3 Tabs PO Days 1-3, then 2 tabs PO Days 4-6, then 1 tab PO Day 7-9, then Half Tab PO Day 10-12    Dispense:  20 tablet    Refill:  0    Order Specific Question:  Supervising Provider    Answer:  Linna HoffKINDL, JAMES D [phone][[id number]]   Patient received a DuoNeb 0.5/2.5 mg. This may have there is much improvement in air movement and diminished wheezing. Patient states he is breathing better and to take a deep breath. Meds as directed above. Stop smoking. Recommend taking Claritin, Allegra or Zyrtec to minimize drainage which is also contributing to your cough.    Hayden Rasmussenavid Hermela Hardt, NP 06/11/15 2154

## 2015-07-02 ENCOUNTER — Ambulatory Visit: Payer: 59 | Admitting: Registered Nurse

## 2015-07-02 DIAGNOSIS — N4340 Spermatocele of epididymis, unspecified: Secondary | ICD-10-CM | POA: Diagnosis not present

## 2015-07-04 ENCOUNTER — Telehealth: Payer: Self-pay | Admitting: *Deleted

## 2015-07-04 NOTE — Telephone Encounter (Signed)
Patient left a message asking for another cortisone injection for his shoulder.  It has been approximately 2 months since last injection. He says the right shoulder is feeling sore and stiff.Marland Kitchen.Marland Kitchen.Marland Kitchen.Marland Kitchen.please advise

## 2015-07-05 ENCOUNTER — Encounter: Payer: 59 | Attending: Physical Medicine & Rehabilitation | Admitting: Registered Nurse

## 2015-07-05 ENCOUNTER — Encounter: Payer: Self-pay | Admitting: Registered Nurse

## 2015-07-05 VITALS — BP 135/83 | HR 83 | Resp 14

## 2015-07-05 DIAGNOSIS — Z79899 Other long term (current) drug therapy: Secondary | ICD-10-CM | POA: Insufficient documentation

## 2015-07-05 DIAGNOSIS — M89062 Algoneurodystrophy, left lower leg: Secondary | ICD-10-CM | POA: Insufficient documentation

## 2015-07-05 DIAGNOSIS — M7501 Adhesive capsulitis of right shoulder: Secondary | ICD-10-CM | POA: Diagnosis not present

## 2015-07-05 DIAGNOSIS — G90522 Complex regional pain syndrome I of left lower limb: Secondary | ICD-10-CM | POA: Diagnosis not present

## 2015-07-05 DIAGNOSIS — G894 Chronic pain syndrome: Secondary | ICD-10-CM | POA: Insufficient documentation

## 2015-07-05 DIAGNOSIS — Z5181 Encounter for therapeutic drug level monitoring: Secondary | ICD-10-CM | POA: Diagnosis not present

## 2015-07-05 DIAGNOSIS — M7541 Impingement syndrome of right shoulder: Secondary | ICD-10-CM

## 2015-07-05 MED ORDER — OXYCODONE HCL 5 MG PO TABS: ORAL_TABLET | ORAL | Status: DC

## 2015-07-05 NOTE — Progress Notes (Signed)
Subjective:    Patient ID: Shane Cole., male    DOB: 1981-02-08, 35 y.o.   MRN: [id number]  HPI: [name] is a 35 year old male who returns for follow up for chronic pain and medication refill. He states his pain is located in his right shoulder and left foot.He rates his pain 7. His current exercise regime is walking, performing stretching exercises using the bands. Mr. Rudell Cobb is experiencing increase intensity of right shoulder pain, he was working and lifted a drill since then the pain has intensified. He will be scheduled with Dr. Wynn Banker on 4/20 for right shoulder ultrasound injection he verbalizes understanding.  Pain Inventory Average Pain 6 Pain Right Now 7 My pain is sharp, burning, stabbing and aching  In the last 24 hours, has pain interfered with the following? General activity 7 Relation with others 3 Enjoyment of life 7 What TIME of day is your pain at its worst? morning, night Sleep (in general) Poor  Pain is worse with: no selection Pain improves with: no selection Relief from Meds: no selection  Mobility walk without assistance walk with assistance use a cane how many minutes can you walk? 30 Do you have any goals in this area?  yes  Function employed # of hrs/week 40 Do you have any goals in this area?  yes  Neuro/Psych weakness trouble walking  Prior Studies Any changes since last visit?  no  Physicians involved in your care Any changes since last visit?  no   Family History  Problem Relation Age of Onset  . Hyperlipidemia Father   . Hypertension Father   . Anxiety disorder Father   . Drug abuse Father   . Cancer Paternal Grandfather     lung, colon  . Colon cancer Paternal Grandfather   . Lung cancer Paternal Grandfather   . Stroke    . Heart disease    . Diabetes Paternal Grandmother   . Cancer Paternal Grandmother   . Cancer Maternal Grandmother   . Cancer Maternal Grandfather   . Lung cancer Maternal  Grandfather   . Depression Paternal Aunt   . Anxiety disorder Paternal Aunt   . Drug abuse Paternal Uncle    Social History   Social History  . Marital Status: Married    Spouse Name: N/A  . Number of Children: 1  . Years of Education: 10th grade   Occupational History  . Disabled     Previously worked Web designer   Social History Main Topics  . Smoking status: Current Every Day Smoker -- 1.00 packs/day for 23 years    Types: Cigarettes  . Smokeless tobacco: Never Used  . Alcohol Use: 3.6 oz/week    6 Cans of beer per week     Comment: occasional  . Drug Use: No  . Sexual Activity:    Partners: Female   Other Topics Concern  . None   Social History Narrative   05/24/12  Harol was born and grew up in [place], [place]. He has 2 sisters. He reports that his childhood was "lousy." He completed the 10th grade. He has been married for 5 years, and is currently separated for 3 weeks. He has a daughter who is 33-1/2 years old. He has been unemployed for 2 years, and is disabled due to an on-the-job work accident. He is currently living with his aunt and cousin. He reports that his hobbies are sports and dogs. He reports that  he is spiritual, but not religious. He states that his aunt and his father are his social support system. He denies any current legal problems, but got a DUI in 2007. 05/24/12 AHW         Past Surgical History  Procedure Laterality Date  . Mass excision Left 01/25/2013    Procedure: EXCISION MASS DORSAL ASPECT LEFT LONG FINGER DISTAL INTERPHALANGEAL JOINT;  Surgeon: Wyn Forster., MD;  Location: [facility];  Service: Orthopedics;  Laterality: Left;  Left long   . Mouth surgery    . Hand surgery    . Colonoscopy    . Cholecystectomy N/A 01/24/2014    Procedure: LAPAROSCOPIC CHOLECYSTECTOMY;  Surgeon: Axel Filler, MD;  Location: [facility] OR;  Service: General;  Laterality: N/A;   Past Medical History  Diagnosis Date  .  Nerve pain   . Crush injury lower leg     Left lower leg  . Depression   . Migraine   . Bipolar disorder (HCC)   . Allergy   . Anxiety   . Gastric ulcer   . GERD (gastroesophageal reflux disease)     will awaken him in night  . RSD (reflex sympathetic dystrophy)    BP 135/83 mmHg  Pulse 83  Resp 14  SpO2 100%  Opioid Risk Score:   Fall Risk Score:  `1  Depression screen PHQ 2/9  Depression screen [facility] 2/9 06/04/2015 04/09/2015 12/08/2014 12/05/2014 10/26/2014 10/25/2014 10/12/2014  Decreased Interest 0 0 0 0 0 0 0  Down, Depressed, Hopeless 0 0 0 0 0 0 0  PHQ - 2 Score 0 0 0 0 0 0 0  Altered sleeping 0 - - - - - 2  Tired, decreased energy 0 - - - - - 0  Change in appetite 1 - - - - - 1  Feeling bad or failure about yourself  0 - - - - - 0  Trouble concentrating 0 - - - - - 0  Moving slowly or fidgety/restless 0 - - - - - 0  Suicidal thoughts 0 - - - - - 0  PHQ-9 Score 1 - - - - - 3     Review of Systems  Constitutional: Positive for diaphoresis.  Gastrointestinal: Positive for nausea and diarrhea.  All other systems reviewed and are negative.      Objective:   Physical Exam  Constitutional: He is oriented to person, place, and time. He appears well-developed and well-nourished.  HENT:  Head: Normocephalic and atraumatic.  Neck: Normal range of motion. Neck supple.  Cardiovascular: Normal rate and regular rhythm.   Pulmonary/Chest: Effort normal and breath sounds normal.  Musculoskeletal:  Normal Muscle Bulk and Muscle Testing Reveals: Upper Extremities: Right: Decreased ROM 45 Degrees and Muscle Strength 5/5 Left: Full ROM and Muscle Strength 5/5 Right AC Joint Tenderness Lower Extremities: Full ROM and Muscle Strength 5/5 Arises from chair with ease Narrow Based Gait  Neurological: He is alert and oriented to person, place, and time.  Skin: Skin is warm and dry.  Psychiatric: He has a normal mood and affect.  Nursing note and vitals reviewed.           Assessment & Plan:  1 Reflex Sympathetic Dystrophy related to crush injury left foot. 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 Refilled: Oxycodone 5 mg daily #40, may take two tablets a day when he pain is severe. Continue Tramadol. We will continue the opioid monitoring program, this  consists of regular clinic visits, examinations, urine drug screen, pill counts as well as use of [place] Controlled Substance Reporting System.Continue with Ice and Exercise Regime. 2. Right Shoulder Pain/ Frozen Shoulder/ Subacromial Impingement of Right Shoulder: Schedule for right shoulder ultrasound injection with Dr. Wynn BankerKirsteins  20 minutes of face to face patient care time was spent during this visit. All questions were encouraged answered.  F/U in 1 month

## 2015-07-05 NOTE — Progress Notes (Signed)
Patient saw Jacalyn Lefevreunice Thomas 07/05/2015 and reported that his last shoulder injection provided 1 month relief

## 2015-07-09 NOTE — Telephone Encounter (Signed)
May repeat at 34mo

## 2015-07-16 ENCOUNTER — Encounter: Payer: Self-pay | Admitting: Physical Medicine & Rehabilitation

## 2015-07-16 ENCOUNTER — Ambulatory Visit (HOSPITAL_BASED_OUTPATIENT_CLINIC_OR_DEPARTMENT_OTHER): Payer: 59 | Admitting: Physical Medicine & Rehabilitation

## 2015-07-16 VITALS — BP 140/86 | HR 103

## 2015-07-16 DIAGNOSIS — M7501 Adhesive capsulitis of right shoulder: Secondary | ICD-10-CM

## 2015-07-16 DIAGNOSIS — Z5181 Encounter for therapeutic drug level monitoring: Secondary | ICD-10-CM | POA: Diagnosis not present

## 2015-07-16 DIAGNOSIS — M89062 Algoneurodystrophy, left lower leg: Secondary | ICD-10-CM | POA: Diagnosis not present

## 2015-07-16 DIAGNOSIS — Z79899 Other long term (current) drug therapy: Secondary | ICD-10-CM | POA: Diagnosis not present

## 2015-07-16 DIAGNOSIS — G894 Chronic pain syndrome: Secondary | ICD-10-CM | POA: Diagnosis not present

## 2015-07-16 NOTE — Patient Instructions (Signed)
Right glenohumeral shoulder injection with Celestone and lidocaine, may be repeated every 3 months, if this is not very helpful for you we may have to do a diagnostic ultrasound on her shoulder

## 2015-07-16 NOTE — Progress Notes (Signed)
Shoulder injection Right glenohumeral With  ultrasound guidance)  Indication:right Shoulder pain not relieved by medication management and other conservative care.  Informed consent was obtained after describing risks and benefits of the procedure with the patient, this includes bleeding, bruising, infection and medication side effects. The patient wishes to proceed and has given written consent. Patient was placed in a seated position. The Right shoulder was marked and prepped with betadine in the subacromial area. A 25-gauge 1-1/2 inch needle was inserted into the subacromial area. After negative draw back for blood, a solution containing 1 mL of 6 mg per ML betamethasone and 4 mL of 1% lidocaine was injected. A band aid was applied. The patient tolerated the procedure well. Post procedure instructions were given.

## 2015-07-27 ENCOUNTER — Telehealth: Payer: Self-pay

## 2015-07-27 NOTE — Telephone Encounter (Signed)
Pt called and left message asking how long his shoulder injection should last. I advised the pt that relief varies from person to person. It states on the last OV note that a diagnostic US may be done if no relief is provided. He states it has not provided much relief. Can we go forward with US?

## 2015-07-30 NOTE — Telephone Encounter (Signed)
schedule for ultrasound next month

## 2015-07-31 NOTE — Telephone Encounter (Signed)
Left patient a voicemail to schedule Repeat ultrasound guided shoulder injection right side

## 2015-08-06 ENCOUNTER — Encounter: Payer: Self-pay | Admitting: Registered Nurse

## 2015-08-06 ENCOUNTER — Encounter: Payer: 59 | Attending: Physical Medicine & Rehabilitation | Admitting: Registered Nurse

## 2015-08-06 VITALS — BP 138/83 | HR 109 | Resp 15

## 2015-08-06 DIAGNOSIS — M89062 Algoneurodystrophy, left lower leg: Secondary | ICD-10-CM | POA: Diagnosis not present

## 2015-08-06 DIAGNOSIS — Z79899 Other long term (current) drug therapy: Secondary | ICD-10-CM | POA: Diagnosis not present

## 2015-08-06 DIAGNOSIS — M7501 Adhesive capsulitis of right shoulder: Secondary | ICD-10-CM

## 2015-08-06 DIAGNOSIS — Z5181 Encounter for therapeutic drug level monitoring: Secondary | ICD-10-CM | POA: Insufficient documentation

## 2015-08-06 DIAGNOSIS — G894 Chronic pain syndrome: Secondary | ICD-10-CM | POA: Insufficient documentation

## 2015-08-06 DIAGNOSIS — G90522 Complex regional pain syndrome I of left lower limb: Secondary | ICD-10-CM

## 2015-08-06 MED ORDER — OXYCODONE HCL 5 MG PO TABS: ORAL_TABLET | ORAL | Status: DC

## 2015-08-06 NOTE — Progress Notes (Signed)
Subjective:    Patient ID: Marya LandryPaul D Sylvan Jr., male    DOB: 09/06/1980, 35 y.o.   MRN: [id number]  HPI: Mr. Lisbeth Plyaul Frate is a 35 year old male who returns for follow up for chronic pain and medication refill. He states his pain is located in his right shoulder and left foot.He rates his pain 5. S/P right shoulder injection with good relief for 2 weeks.per Dr. Wynn BankerKirsteins note we will order an ulrasound today. Mr. Rudell Cobbolicastro verbalizes understanding.  His current exercise regime is walking, performing stretching exercises.  Pain Inventory Average Pain 6 Pain Right Now 5 My pain is sharp, burning, stabbing and aching  In the last 24 hours, has pain interfered with the following? General activity 6 Relation with others 5 Enjoyment of life 5 What TIME of day is your pain at its worst? Daytime and Evening Sleep (in general) Poor  Pain is worse with: walking, bending, inactivity and some activites Pain improves with: rest, heat/ice, pacing activities, medication and injections Relief from Meds: 4  Mobility walk with assistance use a cane how many minutes can you walk? 30 Do you have any goals in this area?  yes  Function employed # of hrs/week 20 Do you have any goals in this area?  yes  Neuro/Psych weakness trouble walking  Prior Studies Any changes since last visit?  no  Physicians involved in your care Any changes since last visit?  no   Family History  Problem Relation Age of Onset  . Hyperlipidemia Father   . Hypertension Father   . Anxiety disorder Father   . Drug abuse Father   . Cancer Paternal Grandfather     lung, colon  . Colon cancer Paternal Grandfather   . Lung cancer Paternal Grandfather   . Stroke    . Heart disease    . Diabetes Paternal Grandmother   . Cancer Paternal Grandmother   . Cancer Maternal Grandmother   . Cancer Maternal Grandfather   . Lung cancer Maternal Grandfather   . Depression Paternal Aunt   . Anxiety disorder Paternal  Aunt   . Drug abuse Paternal Uncle    Social History   Social History  . Marital Status: Married    Spouse Name: N/A  . Number of Children: 1  . Years of Education: 10th grade   Occupational History  . Disabled     Previously worked Web designerinstalling granite counter tops   Social History Main Topics  . Smoking status: Current Every Day Smoker -- 1.00 packs/day for 23 years    Types: Cigarettes  . Smokeless tobacco: Never Used  . Alcohol Use: 3.6 oz/week    6 Cans of beer per week     Comment: occasional  . Drug Use: No  . Sexual Activity:    Partners: Female   Other Topics Concern  . None   Social History Narrative   05/24/12  [name] was born and grew up in [place], [place]. He has 2 sisters. He reports that his childhood was "lousy." He completed the 10th grade. He has been married for 5 years, and is currently separated for 3 weeks. He has a daughter who is 233-1/11 years old. He has been unemployed for 2 years, and is disabled due to an on-the-job work accident. He is currently living with his aunt and cousin. He reports that his hobbies are sports and dogs. He reports that he is spiritual, but not religious. He states that his aunt and his  father are his social support system. He denies any current legal problems, but got a DUI in 2007. 05/24/12 AHW         Past Surgical History  Procedure Laterality Date  . Mass excision Left 01/25/2013    Procedure: EXCISION MASS DORSAL ASPECT LEFT LONG FINGER DISTAL INTERPHALANGEAL JOINT;  Surgeon: Wyn Forster., MD;  Location: [facility];  Service: Orthopedics;  Laterality: Left;  Left long   . Mouth surgery    . Hand surgery    . Colonoscopy    . Cholecystectomy N/A 01/24/2014    Procedure: LAPAROSCOPIC CHOLECYSTECTOMY;  Surgeon: Axel Filler, MD;  Location: [facility] OR;  Service: General;  Laterality: N/A;   Past Medical History  Diagnosis Date  . Nerve pain   . Crush injury lower leg     Left lower leg  . Depression     . Migraine   . Bipolar disorder (HCC)   . Allergy   . Anxiety   . Gastric ulcer   . GERD (gastroesophageal reflux disease)     will awaken him in night  . RSD (reflex sympathetic dystrophy)    BP 150/86 mmHg  Pulse 116  Resp 15  SpO2 99%  Opioid Risk Score:   Fall Risk Score:  `1  Depression screen PHQ 2/9  Depression screen [facility] 2/9 07/16/2015 06/04/2015 04/09/2015 12/08/2014 12/05/2014 10/26/2014 10/25/2014  Decreased Interest 0 0 0 0 0 0 0  Down, Depressed, Hopeless 0 0 0 0 0 0 0  PHQ - 2 Score 0 0 0 0 0 0 0  Altered sleeping - 0 - - - - -  Tired, decreased energy - 0 - - - - -  Change in appetite - 1 - - - - -  Feeling bad or failure about yourself  - 0 - - - - -  Trouble concentrating - 0 - - - - -  Moving slowly or fidgety/restless - 0 - - - - -  Suicidal thoughts - 0 - - - - -  PHQ-9 Score - 1 - - - - -      Review of Systems  Neurological: Positive for weakness.       Gait Instability  All other systems reviewed and are negative.      Objective:   Physical Exam  Constitutional: He is oriented to person, place, and time. He appears well-developed and well-nourished.  HENT:  Head: Normocephalic and atraumatic.  Neck: Normal range of motion. Neck supple.  Cardiovascular: Normal rate and regular rhythm.   Pulmonary/Chest: Effort normal and breath sounds normal.  Musculoskeletal:  Normal Muscle Bulk and Muscle Testing Reveals: Upper Extremities: Right: Decreased ROM 45 Degrees Internal and  External  Rotation and Muscle Strength 5/5 Left: Full ROM and Muscle Strength 5/5 Lower Extremities: Full ROM and Muscle Strength 5/5 Arises from chair with ease Narrow Based Gait   Neurological: He is alert and oriented to person, place, and time.  Skin: Skin is warm.  Nursing note and vitals reviewed.         Assessment & Plan:  1 Reflex Sympathetic Dystrophy related to crush injury left foot. 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 Refilled:  Oxycodone 5 mg daily. increased # 50, may take two tablets a day when he pain is severe. Continue Tramadol. We will continue the opioid monitoring program, this consists of regular clinic visits, examinations, urine drug screen, pill counts as well as use of [place] Controlled Substance Reporting System.Continue with Ice and Exercise Regime. 2. Right Shoulder Pain/ Frozen Shoulder/ Subacromial Impingement of Right Shoulder: S/P right shoulder ultrasound injection with Dr. Wynn Banker relief noted for 2 weeks. Right Shoulder Ultrasound Ordered Today.  20 minutes of face to face patient care time was spent during this visit. All questions were encouraged answered.  F/U in 1 month

## 2015-08-13 ENCOUNTER — Ambulatory Visit (HOSPITAL_COMMUNITY): Payer: 59

## 2015-08-14 ENCOUNTER — Telehealth: Payer: Self-pay | Admitting: *Deleted

## 2015-08-14 NOTE — Telephone Encounter (Signed)
Pt called to to inform, his ultrasound was rescheduled for June 19th, 2017.........FYI

## 2015-09-04 ENCOUNTER — Encounter: Payer: 59 | Admitting: Registered Nurse

## 2015-09-06 ENCOUNTER — Encounter: Payer: 59 | Attending: Physical Medicine & Rehabilitation | Admitting: Registered Nurse

## 2015-09-06 ENCOUNTER — Encounter: Payer: Self-pay | Admitting: Registered Nurse

## 2015-09-06 VITALS — BP 141/94 | HR 102

## 2015-09-06 DIAGNOSIS — Z79899 Other long term (current) drug therapy: Secondary | ICD-10-CM | POA: Insufficient documentation

## 2015-09-06 DIAGNOSIS — M89062 Algoneurodystrophy, left lower leg: Secondary | ICD-10-CM | POA: Diagnosis not present

## 2015-09-06 DIAGNOSIS — M7501 Adhesive capsulitis of right shoulder: Secondary | ICD-10-CM

## 2015-09-06 DIAGNOSIS — G894 Chronic pain syndrome: Secondary | ICD-10-CM | POA: Diagnosis not present

## 2015-09-06 DIAGNOSIS — Z5181 Encounter for therapeutic drug level monitoring: Secondary | ICD-10-CM | POA: Diagnosis not present

## 2015-09-06 DIAGNOSIS — G90522 Complex regional pain syndrome I of left lower limb: Secondary | ICD-10-CM

## 2015-09-06 MED ORDER — OXYCODONE HCL 5 MG PO TABS: ORAL_TABLET | ORAL | Status: DC

## 2015-09-06 MED ORDER — IBUPROFEN 800 MG PO TABS: 800.0000 mg | ORAL_TABLET | Freq: Two times a day (BID) | ORAL | Status: DC | PRN

## 2015-09-06 MED ORDER — LIDOCAINE 5 % EX OINT: 1.0000 "application " | TOPICAL_OINTMENT | CUTANEOUS | Status: DC | PRN

## 2015-09-06 NOTE — Progress Notes (Signed)
Subjective:    Patient ID: Shane LandryPaul D Schonberger Jr., male    DOB: 09/25/1980, 35 y.o.   MRN: [id number]  HPI: [name] is a 35 year old male who returns for follow up for chronic pain and medication refill. He states his pain is located in his right shoulder and left foot.He rates his pain 4. His current exercise regime is walking and  performing stretching exercises.  Pain Inventory Average Pain 6 Pain Right Now 4 My pain is sharp, burning, stabbing and aching  In the last 24 hours, has pain interfered with the following? General activity 6 Relation with others 3 Enjoyment of life 4 What TIME of day is your pain at its worst? NA Sleep (in general) NA  Pain is worse with: NA Pain improves with: NA Relief from Meds: NA  Mobility use a cane how many minutes can you walk? 45 ability to climb steps?  yes do you drive?  yes Do you have any goals in this area?  yes  Function employed # of hrs/week 30 Do you have any goals in this area?  yes  Neuro/Psych weakness tingling trouble walking  Prior Studies Any changes since last visit?  no  Physicians involved in your care Any changes since last visit?  no   Family History  Problem Relation Age of Onset  . Hyperlipidemia Father   . Hypertension Father   . Anxiety disorder Father   . Drug abuse Father   . Cancer Paternal Grandfather     lung, colon  . Colon cancer Paternal Grandfather   . Lung cancer Paternal Grandfather   . Stroke    . Heart disease    . Diabetes Paternal Grandmother   . Cancer Paternal Grandmother   . Cancer Maternal Grandmother   . Cancer Maternal Grandfather   . Lung cancer Maternal Grandfather   . Depression Paternal Aunt   . Anxiety disorder Paternal Aunt   . Drug abuse Paternal Uncle    Social History   Social History  . Marital Status: Married    Spouse Name: N/A  . Number of Children: 1  . Years of Education: 10th grade   Occupational History  . Disabled     Previously  worked Web designerinstalling granite counter tops   Social History Main Topics  . Smoking status: Current Every Day Smoker -- 1.00 packs/day for 23 years    Types: Cigarettes  . Smokeless tobacco: Never Used  . Alcohol Use: 3.6 oz/week    6 Cans of beer per week     Comment: occasional  . Drug Use: No  . Sexual Activity:    Partners: Female   Other Topics Concern  . Not on file   Social History Narrative   05/24/12  Shane Cole was born and grew up in [place], [place]. He has 2 sisters. He reports that his childhood was "lousy." He completed the 10th grade. He has been married for 5 years, and is currently separated for 3 weeks. He has a daughter who is 703-1/20 years old. He has been unemployed for 2 years, and is disabled due to an on-the-job work accident. He is currently living with his aunt and cousin. He reports that his hobbies are sports and dogs. He reports that he is spiritual, but not religious. He states that his aunt and his father are his social support system. He denies any current legal problems, but got a DUI in 2007. 05/24/12 AHW  Past Surgical History  Procedure Laterality Date  . Mass excision Left 01/25/2013    Procedure: EXCISION MASS DORSAL ASPECT LEFT LONG FINGER DISTAL INTERPHALANGEAL JOINT;  Surgeon: Wyn Forster., MD;  Location: [facility];  Service: Orthopedics;  Laterality: Left;  Left long   . Mouth surgery    . Hand surgery    . Colonoscopy    . Cholecystectomy N/A 01/24/2014    Procedure: LAPAROSCOPIC CHOLECYSTECTOMY;  Surgeon: Axel Filler, MD;  Location: [facility] OR;  Service: General;  Laterality: N/A;   Past Medical History  Diagnosis Date  . Nerve pain   . Crush injury lower leg     Left lower leg  . Depression   . Migraine   . Bipolar disorder (HCC)   . Allergy   . Anxiety   . Gastric ulcer   . GERD (gastroesophageal reflux disease)     will awaken him in night  . RSD (reflex sympathetic dystrophy)    There were no vitals taken  for this visit.  Opioid Risk Score:   Fall Risk Score:  `1  Depression screen PHQ 2/9  Depression screen [facility] 2/9 07/16/2015 06/04/2015 04/09/2015 12/08/2014 12/05/2014 10/26/2014 10/25/2014  Decreased Interest 0 0 0 0 0 0 0  Down, Depressed, Hopeless 0 0 0 0 0 0 0  PHQ - 2 Score 0 0 0 0 0 0 0  Altered sleeping - 0 - - - - -  Tired, decreased energy - 0 - - - - -  Change in appetite - 1 - - - - -  Feeling bad or failure about yourself  - 0 - - - - -  Trouble concentrating - 0 - - - - -  Moving slowly or fidgety/restless - 0 - - - - -  Suicidal thoughts - 0 - - - - -  PHQ-9 Score - 1 - - - - -       Review of Systems  All other systems reviewed and are negative.      Objective:   Physical Exam  Constitutional: He is oriented to person, place, and time. He appears well-developed and well-nourished.  HENT:  Head: Normocephalic and atraumatic.  Neck: Normal range of motion. Neck supple.  Cardiovascular: Normal rate and regular rhythm.   Pulmonary/Chest: Effort normal and breath sounds normal.  Musculoskeletal:  Normal Muscle Bulk and Muscle Testing Reveals: Upper Extremities: Left:Full ROM and Muscle Strength 5/5 Right: Decreased ROM 45 Degrees and Muscle Strength 5/5 Right AC Joint Tenderness Lower Extremities: Full ROM and Muscle Strength 5/5 Arises from chair with ease Narrow based gait  Neurological: He is alert and oriented to person, place, and time.  Skin: Skin is warm and dry.  Psychiatric: He has a normal mood and affect.  Nursing note and vitals reviewed.         Assessment & Plan:  1. Reflex Sympathetic Dystrophy related to crush injury left foot. 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 Refilled: Oxycodone 5 mg daily # 50, may take two tablets a day when the pain is severe. Continue Tramadol. We will continue the opioid monitoring program, this consists of regular clinic visits, examinations, urine drug screen, pill counts as well as use of  [place] Controlled Substance Reporting System.Continue with Ice and Exercise Regime. 2. Right Shoulder Pain/ Frozen Shoulder/ Subacromial Impingement of Right Shoulder: Schedule for Right Shoulder Ultrasound on 09/10/15  20 minutes of face to face patient care time was  spent during this visit. All questions were encouraged answered.  F/U in 1 month

## 2015-09-07 ENCOUNTER — Telehealth (HOSPITAL_COMMUNITY): Payer: Self-pay

## 2015-09-07 NOTE — Telephone Encounter (Signed)
Called to remind pt of 1:00pm appt at [facility] on 09/10/15. Pt agreed to check in at admitting about 15 min early. AW

## 2015-09-10 ENCOUNTER — Ambulatory Visit (HOSPITAL_COMMUNITY): Payer: 59

## 2015-09-14 LAB — TOXASSURE SELECT,+ANTIDEPR,UR: PDF: 0

## 2015-09-17 ENCOUNTER — Telehealth: Payer: Self-pay | Admitting: Registered Nurse

## 2015-09-17 ENCOUNTER — Ambulatory Visit (HOSPITAL_COMMUNITY): Payer: 59

## 2015-09-17 ENCOUNTER — Ambulatory Visit (HOSPITAL_COMMUNITY)
Admission: RE | Admit: 2015-09-17 | Discharge: 2015-09-17 | Disposition: A | Discharge status: Home / Self Care | Payer: 59 | Source: Ambulatory Visit | Attending: Registered Nurse | Admitting: Registered Nurse

## 2015-09-17 DIAGNOSIS — M25511 Pain in right shoulder: Secondary | ICD-10-CM | POA: Diagnosis not present

## 2015-09-17 DIAGNOSIS — M7501 Adhesive capsulitis of right shoulder: Secondary | ICD-10-CM | POA: Insufficient documentation

## 2015-09-17 NOTE — Telephone Encounter (Signed)
Called Shane Cole Ultra sound results reviewed, he verbalizes understanding.

## 2015-09-21 ENCOUNTER — Telehealth: Payer: Self-pay

## 2015-09-21 NOTE — Telephone Encounter (Signed)
Pt's UDS came back negative for Oxycodone and Tramadol. It was declared that he took Oxycodone the day of the UDS on 09/06/15. Tramadol was taken on 09/05/15, the day before UDS. Please advise.

## 2015-09-21 NOTE — Telephone Encounter (Signed)
Placed a call to Shane Cole regarding his UDS. He states he was out of hus Oxycodone for a few days. When he was seen on 09/06/15 he was out of the oxycodone.  It looks like his follow up appointments has been past his refill date. His July appointment  appointment has been changed to July 10th at 9:30.I  will have the office staff call Shane Cole in after his July refill  for a pill count. He is unaware of this.

## 2015-09-28 ENCOUNTER — Other Ambulatory Visit: Payer: Self-pay | Admitting: Registered Nurse

## 2015-10-01 ENCOUNTER — Encounter: Payer: 59 | Attending: Physical Medicine & Rehabilitation | Admitting: Registered Nurse

## 2015-10-01 DIAGNOSIS — G894 Chronic pain syndrome: Secondary | ICD-10-CM | POA: Insufficient documentation

## 2015-10-01 DIAGNOSIS — M89062 Algoneurodystrophy, left lower leg: Secondary | ICD-10-CM | POA: Insufficient documentation

## 2015-10-01 DIAGNOSIS — Z5181 Encounter for therapeutic drug level monitoring: Secondary | ICD-10-CM | POA: Insufficient documentation

## 2015-10-01 DIAGNOSIS — Z79899 Other long term (current) drug therapy: Secondary | ICD-10-CM | POA: Insufficient documentation

## 2015-10-08 ENCOUNTER — Ambulatory Visit: Payer: 59 | Admitting: Registered Nurse

## 2015-10-09 ENCOUNTER — Encounter: Payer: Self-pay | Admitting: Registered Nurse

## 2015-10-09 ENCOUNTER — Encounter (HOSPITAL_BASED_OUTPATIENT_CLINIC_OR_DEPARTMENT_OTHER): Payer: 59 | Admitting: Registered Nurse

## 2015-10-09 VITALS — BP 146/91 | HR 109

## 2015-10-09 DIAGNOSIS — G90522 Complex regional pain syndrome I of left lower limb: Secondary | ICD-10-CM

## 2015-10-09 DIAGNOSIS — Z79899 Other long term (current) drug therapy: Secondary | ICD-10-CM | POA: Diagnosis not present

## 2015-10-09 DIAGNOSIS — Z5181 Encounter for therapeutic drug level monitoring: Secondary | ICD-10-CM | POA: Diagnosis not present

## 2015-10-09 DIAGNOSIS — G894 Chronic pain syndrome: Secondary | ICD-10-CM | POA: Diagnosis not present

## 2015-10-09 DIAGNOSIS — M7501 Adhesive capsulitis of right shoulder: Secondary | ICD-10-CM | POA: Diagnosis not present

## 2015-10-09 DIAGNOSIS — M89062 Algoneurodystrophy, left lower leg: Secondary | ICD-10-CM | POA: Diagnosis not present

## 2015-10-09 MED ORDER — OXYCODONE HCL 5 MG PO TABS: ORAL_TABLET | ORAL | Status: DC

## 2015-10-09 NOTE — Progress Notes (Signed)
Subjective:    Patient ID: Shane LandryPaul D Lagrand Jr., male    DOB: 05/03/1980, 35 y.o.   MRN: [id number]  HPI: Shane Cole is a 35 year Cole male who returns for follow up for chronic pain and medication refill. Shane states his pain is located in his right shoulder, mid-back and second toe.Shane rates his pain 6. His current exercise regime is walking and performing stretching exercises.  Pain Inventory Average Pain 5 Pain Right Now 6 My pain is sharp, burning, stabbing and aching  In the last 24 hours, has pain interfered with the following? General activity 7 Relation with others 4 Enjoyment of life 7 What TIME of day is your pain at its worst? daytime Sleep (in general) Fair  Pain is worse with: walking, bending, standing and some activites Pain improves with: rest, heat/ice, therapy/exercise, pacing activities, medication and injections Relief from Meds: 5  Mobility how many minutes can you walk? 30-45 ability to climb steps?  yes do you drive?  yes  Function employed # of hrs/week 25 Do you have any goals in this area?  yes  Neuro/Psych weakness tingling trouble walking  Prior Studies Any changes since last visit?  no  Physicians involved in your care Any changes since last visit?  no   Family History  Problem Relation Age of Onset  . Hyperlipidemia Father   . Hypertension Father   . Anxiety disorder Father   . Drug abuse Father   . Cancer Paternal Grandfather     lung, colon  . Colon cancer Paternal Grandfather   . Lung cancer Paternal Grandfather   . Stroke    . Heart disease    . Diabetes Paternal Grandmother   . Cancer Paternal Grandmother   . Cancer Maternal Grandmother   . Cancer Maternal Grandfather   . Lung cancer Maternal Grandfather   . Depression Paternal Aunt   . Anxiety disorder Paternal Aunt   . Drug abuse Paternal Uncle    Social History   Social History  . Marital Status: Married    Spouse Name: N/A  . Number of Children: 1  .  Years of Education: 10th grade   Occupational History  . Disabled     Previously worked Web designerinstalling granite counter tops   Social History Main Topics  . Smoking status: Current Every Day Smoker -- 1.00 packs/day for 23 years    Types: Cigarettes  . Smokeless tobacco: Never Used  . Alcohol Use: 3.6 oz/week    6 Cans of beer per week     Comment: occasional  . Drug Use: No  . Sexual Activity:    Partners: Female   Other Topics Concern  . None   Social History Narrative   05/24/12  Shane Cole was born [place]. Shane has 2 sisters. Shane reports that his childhood was "lousy." Shane completed the 10th grade. Shane has been married for 5 years, and is currently separated for 3 weeks. Shane Cole. Shane has been unemployed for 2 years, and is disabled due to an on-the-job work accident. Shane is currently living with his aunt and cousin. Shane reports that his hobbies are sports and dogs. Shane reports that Shane is spiritual, but not religious. Shane states that his aunt and his father are his social support system. Shane denies any current legal problems, but got a DUI in 2007. 05/24/12 AHW  Past Surgical History  Procedure Laterality Date  . Mass excision Left 01/25/2013    Procedure: EXCISION MASS DORSAL ASPECT LEFT LONG FINGER DISTAL INTERPHALANGEAL JOINT;  Surgeon: Wyn Forster., MD;  Location: [facility];  Service: Orthopedics;  Laterality: Left;  Left long   . Mouth surgery    . Hand surgery    . Colonoscopy    . Cholecystectomy N/A 01/24/2014    Procedure: LAPAROSCOPIC CHOLECYSTECTOMY;  Surgeon: Axel Filler, MD;  Location: [facility] OR;  Service: General;  Laterality: N/A;   Past Medical History  Diagnosis Date  . Nerve pain   . Crush injury lower leg     Left lower leg  . Depression   . Migraine   . Bipolar disorder (HCC)   . Allergy   . Anxiety   . Gastric ulcer   . GERD (gastroesophageal reflux disease)     will awaken him in  night  . RSD (reflex sympathetic dystrophy)    BP 146/91 mmHg  Pulse 109  SpO2 99%  Opioid Risk Score:   Fall Risk Score:  `1  Depression screen PHQ 2/9  Depression screen [facility] 2/9 09/06/2015 07/16/2015 06/04/2015 04/09/2015 12/08/2014 12/05/2014 10/26/2014  Decreased Interest 0 0 0 0 0 0 0  Down, Depressed, Hopeless 0 0 0 0 0 0 0  PHQ - 2 Score 0 0 0 0 0 0 0  Altered sleeping - - 0 - - - -  Tired, decreased energy - - 0 - - - -  Change in appetite - - 1 - - - -  Feeling bad or failure about yourself  - - 0 - - - -  Trouble concentrating - - 0 - - - -  Moving slowly or fidgety/restless - - 0 - - - -  Suicidal thoughts - - 0 - - - -  PHQ-9 Score - - 1 - - - -     Review of Systems  All other systems reviewed and are negative.      Objective:   Physical Exam  Constitutional: Shane is oriented to person, place, and time. Shane appears well-developed and well-nourished.  HENT:  Head: Normocephalic and atraumatic.  Neck: Normal range of motion.  Cardiovascular: Normal rate and regular rhythm.   Pulmonary/Chest: Effort normal and breath sounds normal.  Musculoskeletal:  Normal Muscle Bulk and Muscle Testing Reveals: Upper Extremities: Right: Decreased ROM 45 Degrees and Muscle Strength 5/5 Left: Full ROM and Muscle Strength 5/5 Thoracic Paraspinal Tenderness: T-10- T-11 Mainly Left Side Lower Extremities: Full ROM and Muscle Strength 5/5 Arises from Table with ease Narrow Based Gait    Neurological: Shane is alert and oriented to person, place, and time.  Skin: Skin is warm and dry.  Psychiatric: Shane has a normal mood and affect.  Nursing note and vitals reviewed.         Assessment & Plan:  1. Reflex Sympathetic Dystrophy related to crush injury left foot. Shane has a nonunion fracture at the second metatarsal head. Refilled: Oxycodone 5 mg daily # 50, may take two tablets a day when the pain is severe. Continue Tramadol. We will continue the opioid monitoring program, this  consists of regular clinic visits, examinations, urine drug screen, pill counts as well as use of [place] Controlled Substance Reporting System.Continue with Ice and Exercise Regime. 2. Right Shoulder Pain/ Frozen Shoulder/ Subacromial Impingement of Right Shoulder: Schedule for Right Shoulder Ultrasound Injection on 11/05/15 with Dr. Wynn Banker.  20 minutes  of face to face patient care time was spent during this visit. All questions were encouraged answered.  F/U in 1 month

## 2015-10-24 ENCOUNTER — Telehealth: Payer: Self-pay | Admitting: Physical Medicine & Rehabilitation

## 2015-10-24 NOTE — Telephone Encounter (Signed)
Patient returned our call, I explained to him that we need him in our office today by 4:00 for a pill count.  He states he is at work and would try his best to make it.

## 2015-10-24 NOTE — Telephone Encounter (Signed)
Placed a call to [name], unable to leave message, voice mail full.

## 2015-10-25 ENCOUNTER — Telehealth: Payer: Self-pay | Admitting: Physical Medicine & Rehabilitation

## 2015-10-25 ENCOUNTER — Telehealth: Payer: Self-pay | Admitting: Registered Nurse

## 2015-10-25 NOTE — Telephone Encounter (Signed)
Placed a call to Mr. Rudell Cobb, voicemail full. Will await his wife return call today.

## 2015-10-25 NOTE — Telephone Encounter (Signed)
Patient called this morning to confirm his appt had been changed from the 14th to the 17th - which was confirmed with the patient - and the patient states that he was returning a call form yesterday and stated that he could not come to the office yesterday due to the fact that he was working in [employer].

## 2015-10-25 NOTE — Telephone Encounter (Signed)
Return Mr. Rudell Cobb call,  Explain I wanted him to come in for a pill count, he was very upset stating " the person who spoke to him yesterday was nasty and he felt belittle, also states it made him feel like he was a " drug addict". He states when he received the call yesterday he was at work, he works as a Corporate investment banker and they are not allowed to carry their phones due to [employer].  He proceeded to say he doesn't want to come back to our office due to how he was treated. I spoke with him in great deal, was encouraged to come in for a pill count today. He realizes if he doesn't come in for a pill count he will be discharge. He states he will come in, he was instructed to be here by 4:00 p.m. He verbalizes understanding. Also stated he has received great care at our office by Dr. Wynn Banker and this writer. Also stated Hilda Lias has been great as well. Encouraged to come in and we will have his complaint addressed he verbalizes understanding.  He apologizes for making a complaint, I reassured Mr. Rudell Cobb we will resolve the matter, he verbalizes understanding.

## 2015-10-30 NOTE — Telephone Encounter (Signed)
Mr Shane Cole did not report for pill count as instructed and therefore is being discharged from clinic. Letter written.

## 2015-11-05 ENCOUNTER — Ambulatory Visit: Payer: 59 | Admitting: Physical Medicine & Rehabilitation

## 2015-11-08 ENCOUNTER — Ambulatory Visit: Payer: 59 | Admitting: Physical Medicine & Rehabilitation

## 2015-11-08 ENCOUNTER — Ambulatory Visit: Payer: 59

## 2016-02-26 ENCOUNTER — Ambulatory Visit (INDEPENDENT_AMBULATORY_CARE_PROVIDER_SITE_OTHER): Payer: 59 | Admitting: Adult Health

## 2016-02-26 ENCOUNTER — Encounter: Payer: Self-pay | Admitting: Adult Health

## 2016-02-26 VITALS — BP 142/98 | Temp 98.1°F | Ht 70.0 in | Wt 219.2 lb

## 2016-02-26 DIAGNOSIS — I10 Essential (primary) hypertension: Secondary | ICD-10-CM | POA: Diagnosis not present

## 2016-02-26 DIAGNOSIS — Z760 Encounter for issue of repeat prescription: Secondary | ICD-10-CM

## 2016-02-26 DIAGNOSIS — Z7689 Persons encountering health services in other specified circumstances: Secondary | ICD-10-CM | POA: Diagnosis not present

## 2016-02-26 DIAGNOSIS — F17200 Nicotine dependence, unspecified, uncomplicated: Secondary | ICD-10-CM | POA: Diagnosis not present

## 2016-02-26 MED ORDER — LIDOCAINE 5 % EX OINT: 1.0000 "application " | TOPICAL_OINTMENT | CUTANEOUS | 2 refills | Status: DC | PRN

## 2016-02-26 MED ORDER — GABAPENTIN 300 MG PO CAPS: 300.0000 mg | ORAL_CAPSULE | Freq: Three times a day (TID) | ORAL | 3 refills | Status: DC

## 2016-02-26 MED ORDER — NORTRIPTYLINE HCL 25 MG PO CAPS: 25.0000 mg | ORAL_CAPSULE | Freq: Every day | ORAL | 1 refills | Status: DC

## 2016-02-26 MED ORDER — LISINOPRIL 10 MG PO TABS: 10.0000 mg | ORAL_TABLET | Freq: Every day | ORAL | 1 refills | Status: DC

## 2016-02-26 MED ORDER — BUPROPION HCL ER (SR) 150 MG PO TB12: 150.0000 mg | ORAL_TABLET | Freq: Two times a day (BID) | ORAL | 1 refills | Status: DC

## 2016-02-26 NOTE — Patient Instructions (Signed)
It was great meeting you today.   I have sent in the required prescriptions  I have also added lisinopril to help lower your blood pressure as well as Wellbutrin to help with you to stop smoking.   Follow up in one month for physical exam

## 2016-02-26 NOTE — Progress Notes (Signed)
Patient presents to clinic today to establish care. He is a pleasant 35 year old male who  has a past medical history of Allergy; Anxiety; Bipolar disorder (HCC); Crush injury lower leg; Depression; Gastric ulcer; GERD (gastroesophageal reflux disease); Migraine; Nerve pain; and RSD (reflex sympathetic dystrophy).   Acute Concerns: Establish Care   Chronic Issues: Hypertension - He has been on clonidine in the past ( 6 years ago). He has not been on anything since.  BP Readings from Last 3 Encounters:  02/26/16 (!) 142/98  10/09/15 (!) 146/91  09/06/15 (!) 141/94    Smoking Cessation  - Smokes half pack cigarettes per day history quitting in the past with Chantix, but did not feel this was working well for him so he quit taking it. Continues to want to quit and has also tried the gum and the patch in the past. He reports that the patch made his arms well and he does not like the taste of the gum   Anxiety/Depression/Bipolar - He reports that he does not have any issues with these problems any longer  Crush injury to right foot - Is followed by pain management but does not want to take narcotics all the time. He reports a lot of chronic neuropathy in his left foort  Health Maintenance: Dental --Does not do routine care  Vision -- Does not do routine care Immunizations -- Does not do flu shots  Colonoscopy - 2015   He is followed - Pain management   Past Medical History:  Diagnosis Date  . Allergy   . Anxiety   . Bipolar disorder (HCC)   . Crush injury lower leg    Left lower leg  . Depression   . Gastric ulcer   . GERD (gastroesophageal reflux disease)    will awaken him in night  . Migraine   . Nerve pain   . RSD (reflex sympathetic dystrophy)     Past Surgical History:  Procedure Laterality Date  . CHOLECYSTECTOMY N/A 01/24/2014   Procedure: LAPAROSCOPIC CHOLECYSTECTOMY;  Surgeon: Axel FillerArmando Ramirez, MD;  Location: [facility] OR;  Service: General;  Laterality: N/A;  .  COLONOSCOPY    . HAND SURGERY    . MASS EXCISION Left 01/25/2013   Procedure: EXCISION MASS DORSAL ASPECT LEFT LONG FINGER DISTAL INTERPHALANGEAL JOINT;  Surgeon: Wyn Forsterobert V Sypher Jr., MD;  Location: [facility];  Service: Orthopedics;  Laterality: Left;  Left long   . MOUTH SURGERY      Current Outpatient Prescriptions on File Prior to Visit  Medication Sig Dispense Refill  . albuterol (PROVENTIL HFA;VENTOLIN HFA) 108 (90 Base) MCG/ACT inhaler Inhale 2 puffs into the lungs every 4 (four) hours as needed for wheezing or shortness of breath. 1 Inhaler 0  . gabapentin (NEURONTIN) 600 MG tablet Take 1 tablet (600 mg total) by mouth 3 (three) times daily. 180 tablet 5  . ibuprofen (ADVIL,MOTRIN) 800 MG tablet Take 1 tablet (800 mg total) by mouth 2 (two) times daily as needed for moderate pain. 60 tablet 1  . lidocaine (XYLOCAINE) 5 % ointment Apply 1 application topically as needed. 35.44 g 2  . Multiple Vitamin (MULTIVITAMIN WITH MINERALS) TABS tablet Take 1 tablet by mouth daily.    Marland Kitchen. oxyCODONE (OXY IR/ROXICODONE) 5 MG immediate release tablet Take 1 tablet AS NEEDED daily for severe pain 50 tablet 0  . pantoprazole (PROTONIX) 40 MG tablet Take 30 minutes before breakfast and before supper. 30 tablet 11  . ranitidine (ZANTAC)  150 MG tablet Take 1 tablet (150 mg total) by mouth at bedtime. 30 tablet 2  . traMADol (ULTRAM) 50 MG tablet TAKE 1 TABLET BY MOUTH EVERY 12 HOURS 60 tablet 0  . traZODone (DESYREL) 50 MG tablet TAKE 1/2 TO 1 TABLET BY MOUTH AT BEDTIME AS NEEDED FOR SLEEP 30 tablet 2   No current facility-administered medications on file prior to visit.     Allergies  Allergen Reactions  . Aspirin Other (See Comments)    Tinnitus, dizzy, short of breath  . Codeine Itching    Reaction to tylenol #3  . Penicillins Anaphylaxis  . Amitriptyline Rash    Family History  Problem Relation Age of Onset  . Hyperlipidemia Father   . Hypertension Father   . Anxiety disorder  Father   . Drug abuse Father   . Cancer Paternal Grandfather     lung, colon  . Colon cancer Paternal Grandfather   . Lung cancer Paternal Grandfather   . Stroke    . Heart disease    . Diabetes Paternal Grandmother   . Cancer Paternal Grandmother   . Cancer Maternal Grandmother   . Cancer Maternal Grandfather   . Lung cancer Maternal Grandfather   . Depression Paternal Aunt   . Anxiety disorder Paternal Aunt   . Drug abuse Paternal Uncle     Social History   Social History  . Marital status: Married    Spouse name: N/A  . Number of children: 1  . Years of education: 10th grade   Occupational History  . Disabled     Previously worked Web designer   Social History Main Topics  . Smoking status: Current Every Day Smoker    Packs/day: 1.00    Years: 23.00    Types: Cigarettes  . Smokeless tobacco: Never Used  . Alcohol use 3.6 oz/week    6 Cans of beer per week     Comment: occasional  . Drug use: No  . Sexual activity: Yes    Partners: Female   Other Topics Concern  . Not on file   Social History Narrative   05/24/12  Shane Cole was born and grew up in [place], [place]. He has 2 sisters. He reports that his childhood was "lousy." He completed the 10th grade. He has been married for 5 years, and is currently separated for 3 weeks. He has a daughter who is 3-1/2 years old. He has been unemployed for 2 years, and is disabled due to an on-the-job work accident. He is currently living with his aunt and cousin. He reports that his hobbies are sports and dogs. He reports that he is spiritual, but not religious. He states that his aunt and his father are his social support system. He denies any current legal problems, but got a DUI in 2007. 05/24/12 AHW          Review of Systems  Constitutional: Negative.   HENT: Negative.   Eyes: Negative.   Respiratory: Negative.   Cardiovascular: Negative.   Gastrointestinal: Negative.   Genitourinary: Negative.     Musculoskeletal: Negative.   Skin: Negative.   Neurological: Negative.   Endo/Heme/Allergies: Negative.   Psychiatric/Behavioral: Negative.   All other systems reviewed and are negative.   BP (!) 142/98   Temp 98.1 F (36.7 C) (Oral)   Ht 5\' 10"  (1.778 m)   Wt 219 lb 3.2 oz (99.4 kg)   BMI 31.45 kg/m   Physical Exam  Constitutional:  He is oriented to person, place, and time and well-developed, well-nourished, and in no distress. No distress.  Eyes: Conjunctivae and EOM are normal. Pupils are equal, round, and reactive to light. Right eye exhibits no discharge. Left eye exhibits no discharge.  Neck: Normal range of motion. Neck supple. No thyromegaly present.  Cardiovascular: Normal rate, regular rhythm, normal heart sounds and intact distal pulses.  Exam reveals no gallop and no friction rub.   No murmur heard. Pulmonary/Chest: Effort normal and breath sounds normal. No respiratory distress. He has no wheezes. He has no rales. He exhibits no tenderness.  Musculoskeletal: He exhibits tenderness (left foot ) and deformity (left foot ).  Walks with a limping gait  Lymphadenopathy:    He has no cervical adenopathy.  Neurological: He is alert and oriented to person, place, and time. He has normal reflexes. He displays normal reflexes. No cranial nerve deficit. He exhibits normal muscle tone. Gait normal. Coordination normal. GCS score is 15.  Skin: Skin is warm and dry. No rash noted. He is not diaphoretic. No erythema. No pallor.  Psychiatric: Mood, memory, affect and judgment normal.  Nursing note and vitals reviewed.   Assessment/Plan: 1. Encounter to establish care - Follow up in one month for follow up on hypertension and quitting smoking - Follow up for CPE - Advised to eat a heart healthy diet and exercise on a regular basis.  - He needs to follow up with pain management for narcotic prescriptions   2. Tobacco use disorder - Advised to pick a quit date and stick to it. I  would like to see him back in one year or sooner if needed - buPROPion (WELLBUTRIN SR) 150 MG 12 hr tablet; Take 1 tablet (150 mg total) by mouth 2 (two) times daily.  Dispense: 180 tablet; Refill: 1  3. Essential hypertension - Will start on lisinopril 10 mg  - lisinopril (PRINIVIL,ZESTRIL) 10 MG tablet; Take 1 tablet (10 mg total) by mouth daily.  Dispense: 30 tablet; Refill: 1 - Follow up in one month or sooner if needed 4. Medication refill  - lidocaine (XYLOCAINE) 5 % ointment; Apply 1 application topically as needed.  Dispense: 35.44 g; Refill: 2 - gabapentin (NEURONTIN) 300 MG capsule; Take 1 capsule (300 mg total) by mouth 3 (three) times daily.  Dispense: 270 capsule; Refill: 3 - nortriptyline (PAMELOR) 25 MG capsule; Take 1 capsule (25 mg total) by mouth at bedtime.  Dispense: 30 capsule; Refill: 1   Shirline Freesory Herschell Virani, NP

## 2016-02-28 ENCOUNTER — Telehealth: Payer: Self-pay | Admitting: Adult Health

## 2016-02-28 NOTE — Telephone Encounter (Signed)
Pt would like to have Rx for Tramadol   Pharm:  [facility]

## 2016-02-28 NOTE — Telephone Encounter (Signed)
Has established care with you 02/26/16.  Okay to fill?

## 2016-02-29 NOTE — Telephone Encounter (Signed)
Pt calling to check the status of the Rx. °

## 2016-03-03 NOTE — Telephone Encounter (Signed)
As discussed at his office visit, since he is with pain management he will need to get his narcotics filled there. I cannot write him for narcotics

## 2016-03-03 NOTE — Telephone Encounter (Signed)
Called and spoke with pt informing pt of Cory's recommendations. Pt verbalized understanding nothing further needed at this time.

## 2016-03-15 ENCOUNTER — Telehealth: Payer: 59 | Admitting: Nurse Practitioner

## 2016-03-15 DIAGNOSIS — R059 Cough, unspecified: Secondary | ICD-10-CM

## 2016-03-15 DIAGNOSIS — R05 Cough: Secondary | ICD-10-CM

## 2016-03-15 MED ORDER — ALBUTEROL SULFATE HFA 108 (90 BASE) MCG/ACT IN AERS: 2.0000 | INHALATION_SPRAY | RESPIRATORY_TRACT | 0 refills | Status: DC | PRN

## 2016-03-15 MED ORDER — BENZONATATE 100 MG PO CAPS: 100.0000 mg | ORAL_CAPSULE | Freq: Three times a day (TID) | ORAL | 0 refills | Status: DC | PRN

## 2016-03-15 MED ORDER — AZITHROMYCIN 250 MG PO TABS: ORAL_TABLET | ORAL | 0 refills | Status: DC

## 2016-03-15 NOTE — Progress Notes (Signed)
We are sorry that you are not feeling well.  Here is how we plan to help!  Based on what you have shared with me it looks like you have upper respiratory tract inflammation that has resulted in a significant cough.  Inflammation and infection in the upper respiratory tract is commonly called bronchitis and has four common causes:  Allergies, Viral Infections, Acid Reflux and Bacterial Infections.  Allergies, viruses and acid reflux are treated by controlling symptoms or eliminating the cause. An example might be a cough caused by taking certain blood pressure medications. You stop the cough by changing the medication. Another example might be a cough caused by acid reflux. Controlling the reflux helps control the cough.  Based on your presentation I believe you most likely have A cough due to bacteria.  When patients have a fever and a productive cough with a change in color or increased sputum production, we are concerned about bacterial bronchitis.  If left untreated it can progress to pneumonia.  If your symptoms do not improve with your treatment plan it is important that you contact your provider.   I have prescribed Azithromyin 250 mg: two tables now and then one tablet daily for 4 additonal days    In addition you may use A prescription cough medication called Tessalon Perles 100mg . You may take 1-2 capsules every 8 hours as needed for your cough.  Refilled inhaler- albuterol  USE OF BRONCHODILATOR ("RESCUE") INHALERS: There is a risk from using your bronchodilator too frequently.  The risk is that over-reliance on a medication which only relaxes the muscles surrounding the breathing tubes can reduce the effectiveness of medications prescribed to reduce swelling and congestion of the tubes themselves.  Although you feel brief relief from the bronchodilator inhaler, your asthma may actually be worsening with the tubes becoming more swollen and filled with mucus.  This can delay other crucial  treatments, such as oral steroid medications. If you need to use a bronchodilator inhaler daily, several times per day, you should discuss this with your provider.  There are probably better treatments that could be used to keep your asthma under control.     HOME CARE . Only take medications as instructed by your medical team. . Complete the entire course of an antibiotic. . Drink plenty of fluids and get plenty of rest. . Avoid close contacts especially the very young and the elderly . Cover your mouth if you cough or cough into your sleeve. . Always remember to wash your hands . A steam or ultrasonic humidifier can help congestion.   GET HELP RIGHT AWAY IF: . You develop worsening fever. . You become short of breath . You cough up blood. . Your symptoms persist after you have completed your treatment plan MAKE SURE YOU   Understand these instructions.  Will watch your condition.  Will get help right away if you are not doing well or get worse.  Your e-visit answers were reviewed by a board certified advanced clinical practitioner to complete your personal care plan.  Depending on the condition, your plan could have included both over the counter or prescription medications. If there is a problem please reply  once you have received a response from your provider. Your safety is important to us.  If you have drug allergies check your prescription carefully.    You can use MyChart to ask questions about today's visit, request a non-urgent call back, or ask for a work or school excuse for  24 hours related to this e-Visit. If it has been greater than 24 hours you will need to follow up with your provider, or enter a new e-Visit to address those concerns. You will get an e-mail in the next two days asking about your experience.  I hope that your e-visit has been valuable and will speed your recovery. Thank you for using e-visits.

## 2016-03-25 ENCOUNTER — Ambulatory Visit: Payer: 59 | Admitting: Adult Health

## 2016-04-02 ENCOUNTER — Ambulatory Visit (INDEPENDENT_AMBULATORY_CARE_PROVIDER_SITE_OTHER): Payer: 59 | Admitting: Adult Health

## 2016-04-02 ENCOUNTER — Encounter: Payer: Self-pay | Admitting: Adult Health

## 2016-04-02 VITALS — BP 126/80 | Temp 97.6°F | Ht 70.0 in | Wt 221.8 lb

## 2016-04-02 DIAGNOSIS — I10 Essential (primary) hypertension: Secondary | ICD-10-CM

## 2016-04-02 DIAGNOSIS — G90522 Complex regional pain syndrome I of left lower limb: Secondary | ICD-10-CM

## 2016-04-02 DIAGNOSIS — K2100 Gastro-esophageal reflux disease with esophagitis, without bleeding: Secondary | ICD-10-CM

## 2016-04-02 DIAGNOSIS — Z0000 Encounter for general adult medical examination without abnormal findings: Secondary | ICD-10-CM

## 2016-04-02 DIAGNOSIS — F17200 Nicotine dependence, unspecified, uncomplicated: Secondary | ICD-10-CM

## 2016-04-02 DIAGNOSIS — J029 Acute pharyngitis, unspecified: Secondary | ICD-10-CM | POA: Diagnosis not present

## 2016-04-02 LAB — LIPID PANEL
CHOL/HDL RATIO: 3
CHOLESTEROL: 185 mg/dL (ref 0–200)
HDL: 53.6 mg/dL (ref >39.00)
LDL Cholesterol: 109 mg/dL — ABNORMAL HIGH (ref 0–99)
NonHDL: 131.78
TRIGLYCERIDES: 115 mg/dL (ref 0.0–149.0)
VLDL: 23 mg/dL (ref 0.0–40.0)

## 2016-04-02 LAB — POC URINALSYSI DIPSTICK (AUTOMATED)
BILIRUBIN UA: NEGATIVE
Blood, UA: NEGATIVE
GLUCOSE UA: NEGATIVE
Ketones, UA: NEGATIVE
Leukocytes, UA: NEGATIVE
NITRITE UA: NEGATIVE
Spec Grav, UA: >=1.03
Urobilinogen, UA: 0.2
pH, UA: 6

## 2016-04-02 LAB — BASIC METABOLIC PANEL
BUN: 17 mg/dL (ref 6–23)
CALCIUM: 9.4 mg/dL (ref 8.4–10.5)
CO2: 30 mEq/L (ref 19–32)
Chloride: 102 mEq/L (ref 96–112)
Creatinine, Ser: 1.08 mg/dL (ref 0.40–1.50)
GFR: 81.95 mL/min (ref >60.00)
GLUCOSE: 93 mg/dL (ref 70–99)
POTASSIUM: 4.6 meq/L (ref 3.5–5.1)
SODIUM: 140 meq/L (ref 135–145)

## 2016-04-02 LAB — CBC WITH DIFFERENTIAL/PLATELET
Basophils Absolute: 0 10*3/uL (ref 0.0–0.1)
Basophils Relative: 0.5 % (ref 0.0–3.0)
EOS ABS: 0.3 10*3/uL (ref 0.0–0.7)
EOS PCT: 5.2 % — AB (ref 0.0–5.0)
HEMATOCRIT: 44 % (ref 39.0–52.0)
Hemoglobin: 15 g/dL (ref 13.0–17.0)
LYMPHS ABS: 1.8 10*3/uL (ref 0.7–4.0)
LYMPHS PCT: 28.8 % (ref 12.0–46.0)
MCHC: 34.2 g/dL (ref 30.0–36.0)
MCV: 90.6 fl (ref 78.0–100.0)
MONO ABS: 0.5 10*3/uL (ref 0.1–1.0)
MONOS PCT: 8.1 % (ref 3.0–12.0)
Neutro Abs: 3.6 10*3/uL (ref 1.4–7.7)
Neutrophils Relative %: 57.4 % (ref 43.0–77.0)
PLATELETS: 235 10*3/uL (ref 150.0–400.0)
RBC: 4.85 Mil/uL (ref 4.22–5.81)
RDW: 13.2 % (ref 11.5–15.5)
WBC: 6.2 10*3/uL (ref 4.0–10.5)

## 2016-04-02 LAB — HEPATIC FUNCTION PANEL
ALT: 49 U/L (ref 0–53)
AST: 24 U/L (ref 0–37)
Albumin: 4.6 g/dL (ref 3.5–5.2)
Alkaline Phosphatase: 73 U/L (ref 39–117)
BILIRUBIN TOTAL: 0.4 mg/dL (ref 0.2–1.2)
Bilirubin, Direct: 0.1 mg/dL (ref 0.0–0.3)
TOTAL PROTEIN: 7 g/dL (ref 6.0–8.3)

## 2016-04-02 LAB — POCT RAPID STREP A (OFFICE): RAPID STREP A SCREEN: NEGATIVE

## 2016-04-02 LAB — H. PYLORI ANTIBODY, IGG: H Pylori IgG: NEGATIVE

## 2016-04-02 LAB — TSH: TSH: 1.51 u[IU]/mL (ref 0.35–4.50)

## 2016-04-02 MED ORDER — DEXLANSOPRAZOLE 30 MG PO CPDR: 30.0000 mg | DELAYED_RELEASE_CAPSULE | Freq: Every day | ORAL | 3 refills | Status: DC

## 2016-04-02 MED ORDER — LISINOPRIL 10 MG PO TABS: 10.0000 mg | ORAL_TABLET | Freq: Every day | ORAL | 3 refills | Status: DC

## 2016-04-02 NOTE — Patient Instructions (Signed)
It was great seeing you today!  I have switched your acid reflux medication to Dexilant - Download the savings card - Stop Protonix  - Ween off Zantac  Someone from Neurology will call you   QUIT SMOKING!!  Follow up in one month about your acid reflux

## 2016-04-02 NOTE — Progress Notes (Signed)
Subjective:    Patient ID: Shane Cole., male    DOB: 04/24/1980, 36 y.o.   MRN: [id number]  HPI  Patient presents for yearly preventative medicine examination. He is pleasant 36 year old male who  has a past medical history of Allergy; Anxiety; Bipolar disorder (HCC); Bronchitis; Crush injury lower leg; Depression; Gastric ulcer; GERD (gastroesophageal reflux disease); Migraine; Nerve pain; and RSD (reflex sympathetic dystrophy).   All immunizations and health maintenance protocols were reviewed with the patient and needed orders were placed. He does not want a flu shot   Appropriate screening laboratory values were ordered for the patient including screening of hyperlipidemia, renal function and hepatic function. If indicated by BPH, a PSA was ordered.  Medication reconciliation,  past medical history, social history, problem list and allergies were reviewed in detail with the patient  Goals were established with regard to weight loss, exercise, and  diet in compliance with medications  He is up to date on healthy maintenance items such as dental and vision checks.   He as started on lisinopril 10 mg last visit for blood pressure control. He reports that his blood sugars have been controlled on this medication.   He has multiple issues that he would like to discuss today   1. Epigastric pain - he has a long history of GERD like symptoms. He currently is taking Protonix, Zantac and TUMS. He continues to have epigastric pain and has noticed that he has to take more TUMS than normal as of lately. He tries to eat healthy but continues to smoke. Has had his gallbladder removed.   2. Sore throat/trouble swallowing for multiple months. He feels as though this is getting worse as time goes on. Denies any fevers or feeling acutely ill. He does feel like food is getting stuck when he swallows. Denies any choking sensation.   3. RSD in lower limb He is currently taking Neurontin and  reports that this is not helping. He was seeing pain management in the past but did not want to take narcotics all the time and he feels as though they would not treat him adequally due to only wanting to take pain medications as needed. Due to his nerve pain he would like to see Neurology.   4. Tobacco Use - he has cut back to less than 0.5 packs per day. He feels as though Wellbutrin is working but he plans on using the patch and gum as a supplement   Review of Systems  Constitutional: Negative.   HENT: Positive for sore throat and voice change. Negative for trouble swallowing.   Eyes: Negative.   Respiratory: Negative.   Cardiovascular: Negative.   Gastrointestinal: Positive for abdominal pain. Negative for abdominal distention, anal bleeding, blood in stool, constipation, diarrhea, nausea, rectal pain and vomiting.  Endocrine: Negative.   Genitourinary: Negative.   Musculoskeletal: Negative.   Skin: Negative.   Allergic/Immunologic: Negative.   Neurological: Negative.   Hematological: Positive for adenopathy.  All other systems reviewed and are negative.  Past Medical History:  Diagnosis Date  . Allergy   . Anxiety   . Bipolar disorder (HCC)   . Bronchitis   . Crush injury lower leg    Left lower leg  . Depression   . Gastric ulcer   . GERD (gastroesophageal reflux disease)    will awaken him in night  . Migraine   . Nerve pain   . RSD (reflex sympathetic dystrophy)     Social  History   Social History  . Marital status: Married    Spouse name: N/A  . Number of children: 1  . Years of education: 10th grade   Occupational History  . Disabled     Previously worked Web designer   Social History Main Topics  . Smoking status: Current Every Day Smoker    Packs/day: 1.00    Years: 23.00    Types: Cigarettes  . Smokeless tobacco: Never Used  . Alcohol use 3.6 oz/week    6 Cans of beer per week     Comment: occasional  . Drug use: No  . Sexual  activity: Yes    Partners: Female   Other Topics Concern  . Not on file   Social History Narrative   05/24/12  Byran was born and grew up in [place], [place]. He has 2 sisters. He reports that his childhood was "lousy." He completed the 10th grade. He has been married for 5 years, and is currently separated for 3 weeks. He has a daughter who is 102-1/2 years old. He has been unemployed for 2 years, and is disabled due to an on-the-job work accident. He is currently living with his aunt and cousin. He reports that his hobbies are sports and dogs. He reports that he is spiritual, but not religious. He states that his aunt and his father are his social support system. He denies any current legal problems, but got a DUI in 2007. 05/24/12 [facility]          Past Surgical History:  Procedure Laterality Date  . CHOLECYSTECTOMY N/A 01/24/2014   Procedure: LAPAROSCOPIC CHOLECYSTECTOMY;  Surgeon: Axel Filler, MD;  Location: [facility];  Service: General;  Laterality: N/A;  . COLONOSCOPY    . HAND SURGERY    . MASS EXCISION Left 01/25/2013   Procedure: EXCISION MASS DORSAL ASPECT LEFT LONG FINGER DISTAL INTERPHALANGEAL JOINT;  Surgeon: Wyn Forster., MD;  Location: [facility];  Service: Orthopedics;  Laterality: Left;  Left long   . MOUTH SURGERY      Family History  Problem Relation Age of Onset  . Hyperlipidemia Father   . Hypertension Father   . Anxiety disorder Father   . Drug abuse Father   . Cancer Paternal Grandfather     lung, colon  . Colon cancer Paternal Grandfather   . Lung cancer Paternal Grandfather   . Diabetes Paternal Grandmother   . Cancer Paternal Grandmother   . Cancer Maternal Grandmother   . Cancer Maternal Grandfather   . Lung cancer Maternal Grandfather   . Stroke    . Heart disease    . Depression Paternal Aunt   . Anxiety disorder Paternal Aunt   . Drug abuse Paternal Uncle     Allergies  Allergen Reactions  . Aspirin Other (See Comments)     Tinnitus, dizzy, short of breath  . Codeine Itching    Reaction to tylenol #3  . Penicillins Anaphylaxis  . Amitriptyline Rash    Current Outpatient Prescriptions on File Prior to Visit  Medication Sig Dispense Refill  . albuterol (PROVENTIL HFA;VENTOLIN HFA) 108 (90 Base) MCG/ACT inhaler Inhale 2 puffs into the lungs every 4 (four) hours as needed for wheezing or shortness of breath. 1 Inhaler 0  . buPROPion (WELLBUTRIN SR) 150 MG 12 hr tablet Take 1 tablet (150 mg total) by mouth 2 (two) times daily. 180 tablet 1  . gabapentin (NEURONTIN) 300 MG capsule Take 1  capsule (300 mg total) by mouth 3 (three) times daily. 270 capsule 3  . ibuprofen (ADVIL,MOTRIN) 800 MG tablet Take 1 tablet (800 mg total) by mouth 2 (two) times daily as needed for moderate pain. 60 tablet 1  . lidocaine (XYLOCAINE) 5 % ointment Apply 1 application topically as needed. 35.44 g 2  . lisinopril (PRINIVIL,ZESTRIL) 10 MG tablet Take 1 tablet (10 mg total) by mouth daily. 30 tablet 1  . Multiple Vitamin (MULTIVITAMIN WITH MINERALS) TABS tablet Take 1 tablet by mouth daily.    . nortriptyline (PAMELOR) 25 MG capsule Take 1 capsule (25 mg total) by mouth at bedtime. 30 capsule 1  . pantoprazole (PROTONIX) 40 MG tablet Take 30 minutes before breakfast and before supper. 30 tablet 11  . ranitidine (ZANTAC) 150 MG tablet Take 1 tablet (150 mg total) by mouth at bedtime. 30 tablet 2  . traZODone (DESYREL) 50 MG tablet TAKE 1/2 TO 1 TABLET BY MOUTH AT BEDTIME AS NEEDED FOR SLEEP 30 tablet 2   No current facility-administered medications on file prior to visit.     BP 126/80   Temp 97.6 F (36.4 C) (Oral)   Ht 5\' 10"  (1.778 m)   Wt 221 lb 12.8 oz (100.6 kg)   BMI 31.82 kg/m       Objective:   Physical Exam  Constitutional: He is oriented to person, place, and time. He appears well-developed and well-nourished. No distress.  HENT:  Head: Normocephalic and atraumatic.  Right Ear: External ear normal.  Left Ear:  External ear normal.  Nose: Nose normal.  Mouth/Throat: Uvula is midline and mucous membranes are normal. Posterior oropharyngeal erythema present. No oropharyngeal exudate, posterior oropharyngeal edema or tonsillar abscesses.  Eyes: Conjunctivae and EOM are normal. Pupils are equal, round, and reactive to light. Right eye exhibits no discharge. Left eye exhibits no discharge. No scleral icterus.  Neck: Normal range of motion. Neck supple. No JVD present. No tracheal deviation present. No thyromegaly present.  Cardiovascular: Normal rate, regular rhythm, normal heart sounds and intact distal pulses.  Exam reveals no gallop and no friction rub.   No murmur heard. Pulmonary/Chest: Effort normal and breath sounds normal. No respiratory distress. He has no wheezes. He has no rales. He exhibits no tenderness.  Abdominal: Soft. Normal appearance and bowel sounds are normal. He exhibits no distension and no mass. There is no hepatosplenomegaly, splenomegaly or hepatomegaly. There is tenderness in the epigastric area. There is no rebound. No hernia.  Musculoskeletal: Normal range of motion. He exhibits tenderness (left foot ) and deformity (left foot). He exhibits no edema.  Lymphadenopathy:       Head (left side): Tonsillar adenopathy present.    He has cervical adenopathy.  Neurological: He is alert and oriented to person, place, and time.  Skin: Skin is warm and dry. No rash noted. He is not diaphoretic. No erythema. No pallor.  Psychiatric: He has a normal mood and affect. His behavior is normal. Judgment and thought content normal.  Nursing note and vitals reviewed.     Assessment & Plan:  1. Routine general medical examination at a health care facility - He needs to quit smoking and start exercising outside of work. Continue to work on diet.  - Basic metabolic panel - CBC with Differential/Platelet - Hepatic function panel - Lipid panel - POCT Urinalysis Dipstick (Automated) - TSH - POC  Rapid Strep A - Follow up in one year or sooner if needed  2. Sore throat -  Tenderness on left tonsillar lymph node.  - POC Rapid Strep A - Consider referral to ENT   3. Gastroesophageal reflux disease with esophagitis - H.pylori screen, POC - Dexlansoprazole 30 MG capsule; Take 1 capsule (30 mg total) by mouth daily.  Dispense: 30 capsule; Refill: 3 - H. pylori antibody, IgG - Stop Protonix and ween off Zantac.  - Consider referral to GI for possible endoscopy  - Follow up in one month   4. Essential hypertension - At goal with 10 mg lisinopril  - Basic metabolic panel - CBC with Differential/Platelet - Hepatic function panel - Lipid panel - POCT Urinalysis Dipstick (Automated) - TSH - lisinopril (PRINIVIL,ZESTRIL) 10 MG tablet; Take 1 tablet (10 mg total) by mouth daily.  Dispense: 90 tablet; Refill: 3  5. Complex regional pain syndrome type 1 of left lower extremity  - Ambulatory referral to Neurology  6. Tobacco use disorder - Continue to work on cutting back on the amount of cigarettes per day

## 2016-04-16 IMAGING — US US EXTREM UP*R* COMP
1 series · 14 of 22 positions shown · non-contrast
Comparison: Radiographs dated [DATE]

CLINICAL DATA: Right shoulder pain and popping with limited range
of motion. Adhesive capsulitis.

EXAM:
ULTRASOUND RIGHT UPPER EXTREMITY COMPLETE
TECHNIQUE: Ultrasound examination was performed including evaluation of the
muscles, tendons, joint, and adjacent soft tissues.

[Series 1: us extrem up*right* comp · 0.06mm/px · 22 acquisitions, 14 frames shown]
[im 1/22]
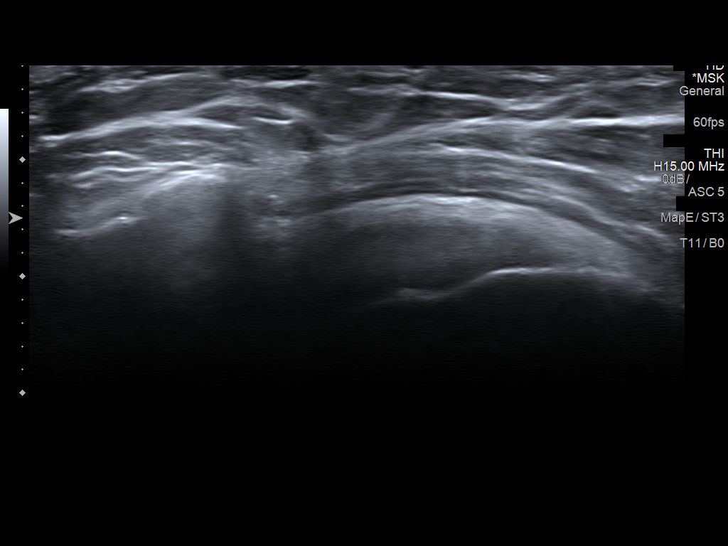
[im 3/22]
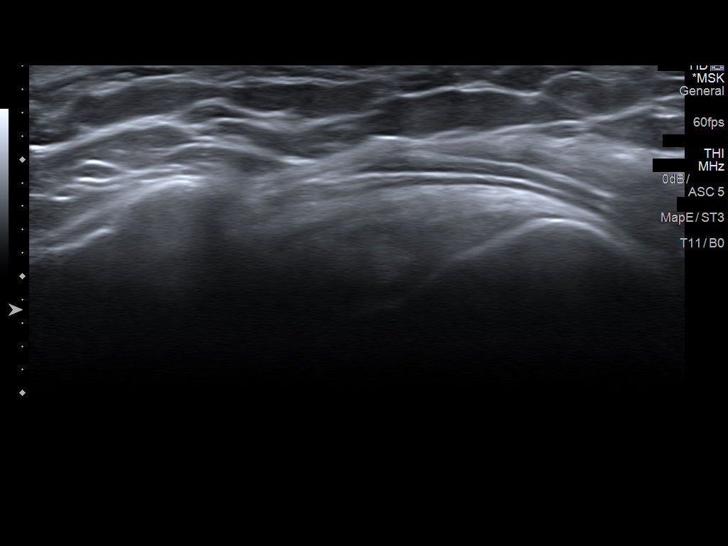
[im 4/22]
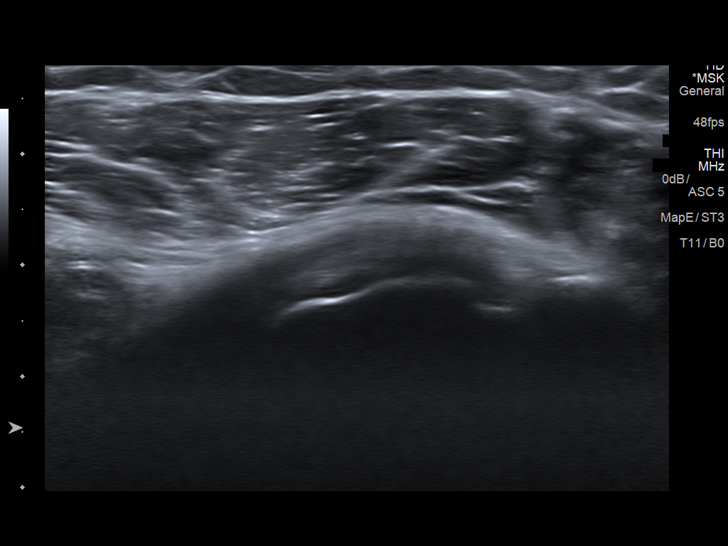
[im 6/22]
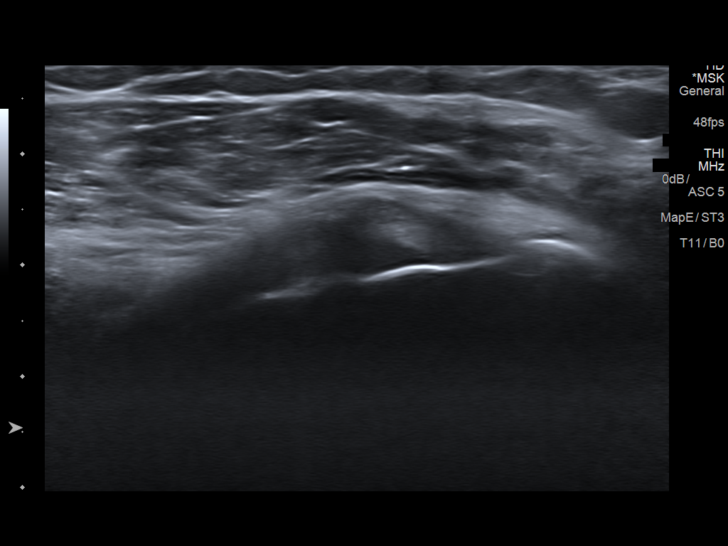
[im 8/22]
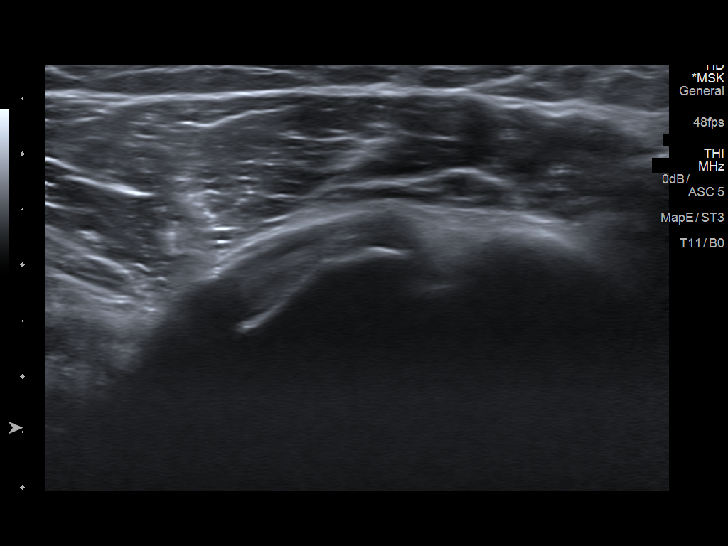
[im 9/22]
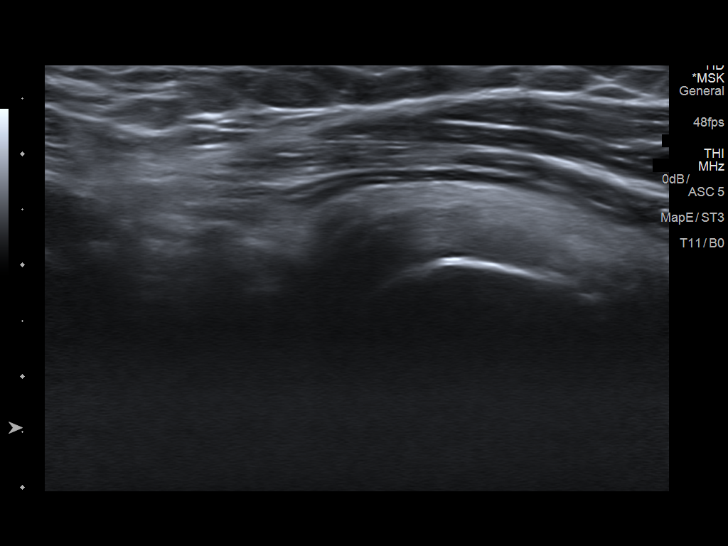
[im 11/22]
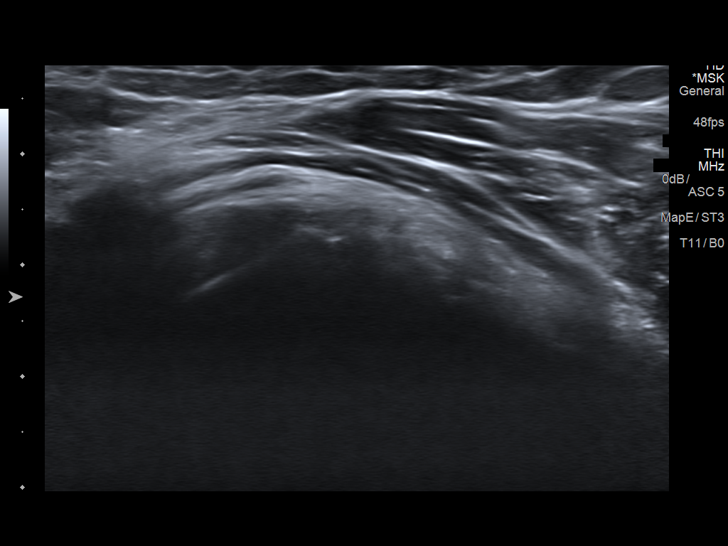
[im 12/22]
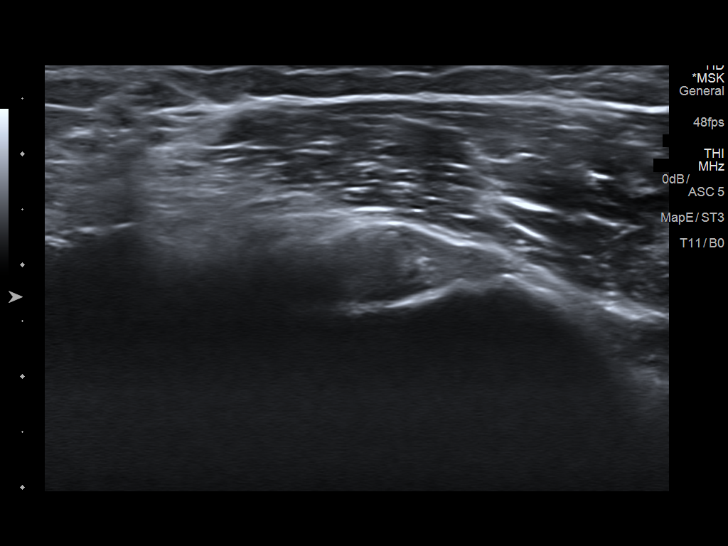
[im 14/22]
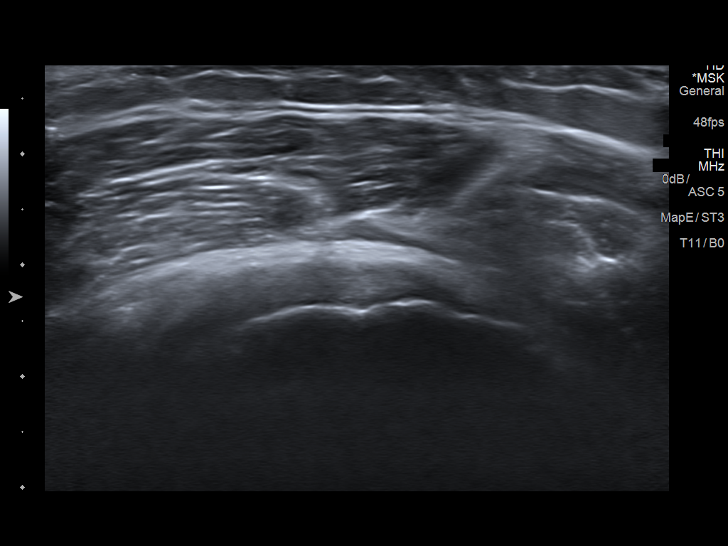
[im 15/22]
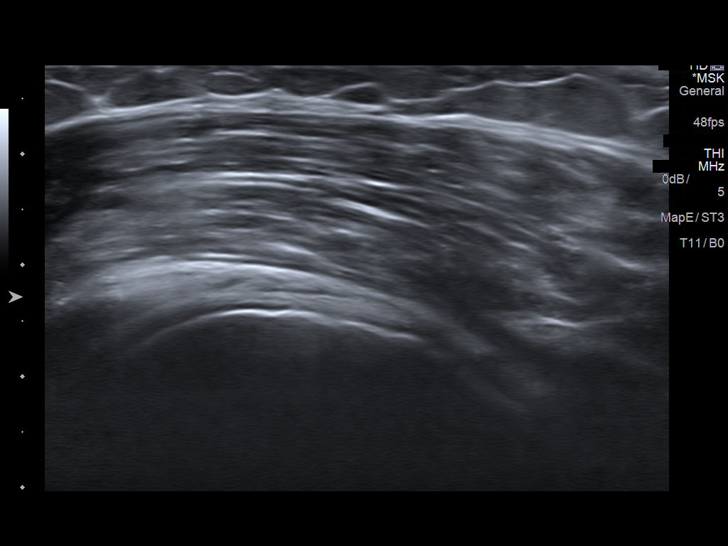
[im 17/22]
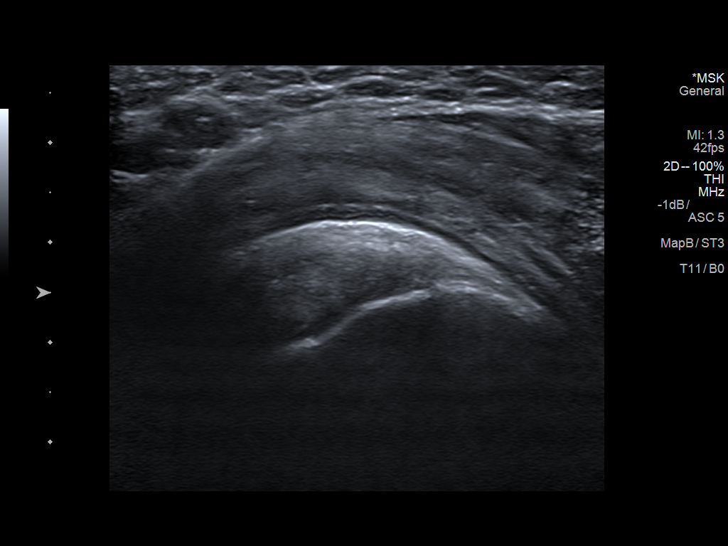
[im 19/22]
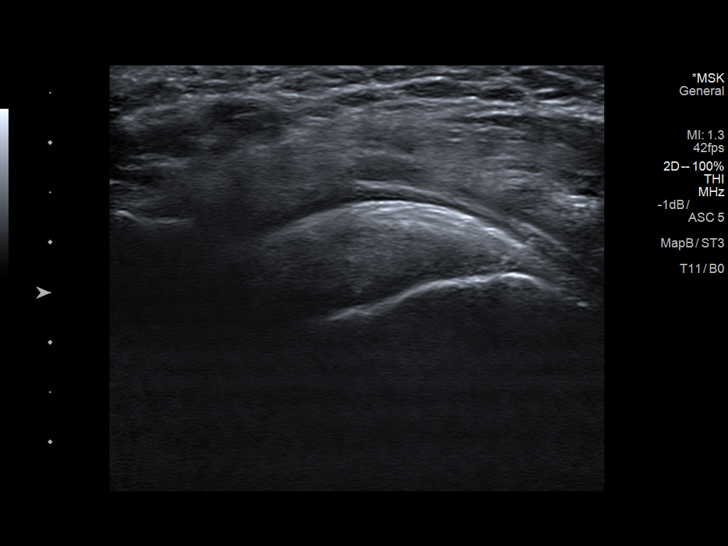
[im 20/22]
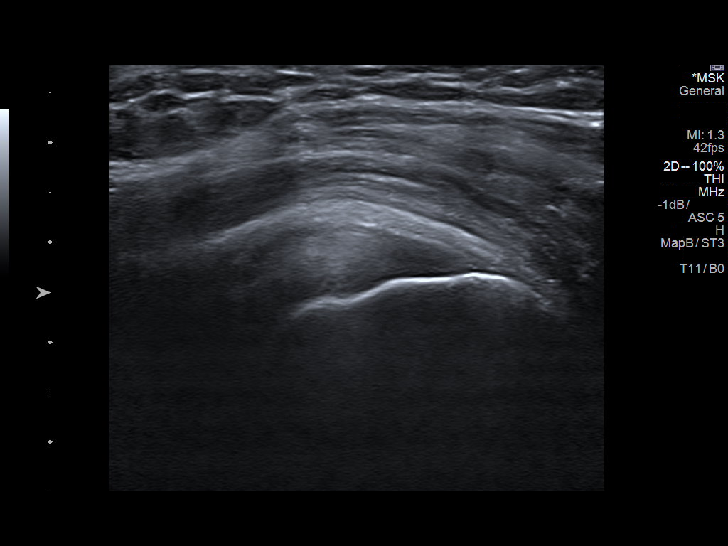
[im 22/22]
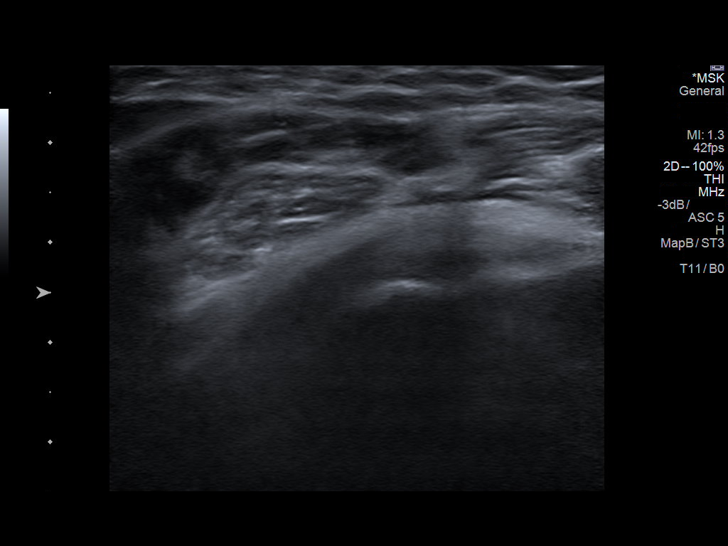

[14 of 22 positions shown; findings below may reference images not displayed]

FINDINGS: The infraspinatus, supraspinous, subscapularis and teres minor
tendons are all well visualized and are intact. Long head of the
biceps tendon is properly located and intact.

No visible bursitis or joint effusion.
IMPRESSION: Normal ultrasound of the right shoulder.

## 2016-05-30 DIAGNOSIS — G9050 Complex regional pain syndrome I, unspecified: Secondary | ICD-10-CM | POA: Diagnosis not present

## 2016-06-11 ENCOUNTER — Encounter: Payer: Self-pay | Admitting: Neurology

## 2016-06-11 ENCOUNTER — Ambulatory Visit (INDEPENDENT_AMBULATORY_CARE_PROVIDER_SITE_OTHER): Payer: 59 | Admitting: Neurology

## 2016-06-11 ENCOUNTER — Telehealth: Payer: Self-pay | Admitting: Neurology

## 2016-06-11 VITALS — BP 114/68 | HR 104 | Temp 98.2°F | Resp 18 | Ht 71.0 in | Wt 224.2 lb

## 2016-06-11 DIAGNOSIS — G90522 Complex regional pain syndrome I of left lower limb: Secondary | ICD-10-CM

## 2016-06-11 NOTE — Patient Instructions (Signed)
Recommend seeing Pain management for RSD

## 2016-06-11 NOTE — Telephone Encounter (Signed)
Referral [facility] to [facility] at [phone] with confirmation received. They will call patient to schedule.

## 2016-06-11 NOTE — Progress Notes (Signed)
[facility] Clinic Note - Initial Visit   Date: 06/11/16  Shane Cole. MRN: [id number] DOB: [birth year]   Dear Shirline Frees, NP:  Thank you for your kind referral of Shane Cole. for consultation of RSD. Although his history is well known to you, please allow [place] to reiterate it for the purpose of our medical record. The patient was accompanied to the clinic by children who also provides collateral information.     History of Present Illness: Shane Cole. is a 36 y.o. right-handed Caucasian male with GERD, depression, and crush injury to the left foot complicated by complex regoinal pain syndrome.  He is referred for my opinion on management of his chronic pain.  He had a work-related injury in 2005 where granite fell on his left foot and he sustained multiple distal foot fractures at the metatarsals and phalanges.  He was immobilized in a boot for 6 months.  He did not have surgery. After the boot came off, he began having swelling, redness, and burning and achy pain over the dorsum of the left foot. He has severe pain with light touch, such as when stroking the skin surface, but has dulled sensation to pin prick.  Pain is worse with weight bearing such as prolonged walking and standing.  He was diagnosed with reflex sympathetic dystrophy in 2007 and was seeing [facility] where nerve blocks were administered.  He has previously tried oxycodone, fentanyl, OxyContin, gabapentin, nortriptyline and Lidoderm.  He has been seeing Dr. Doroteo Bradford for these symptoms.  He currently takes gabapentin 300mg  TID, nortriptyline 75mg  at bedtime, and lidocaine ointment which provides 50% relief.  He does not wish to be on any long term narcotics, but gets relief with as needed opioids when his pain is severe.  He was last prescribed ultram by Charlestine Massed in the summer of 2017.  He has questions regarding Nucynta and other options, such as the role of  spinal cord stimulator.   He works night shift in security.   Past Medical History:  Diagnosis Date  . Allergy   . Anxiety   . Bipolar disorder (HCC)   . Bronchitis   . Crush injury lower leg    Left lower leg  . Depression   . Gastric ulcer   . GERD (gastroesophageal reflux disease)    will awaken him in night  . Migraine   . Nerve pain   . RSD (reflex sympathetic dystrophy)     Past Surgical History:  Procedure Laterality Date  . CHOLECYSTECTOMY N/A 01/24/2014   Procedure: LAPAROSCOPIC CHOLECYSTECTOMY;  Surgeon: Axel Filler, MD;  Location: [facility] OR;  Service: General;  Laterality: N/A;  . COLONOSCOPY    . HAND SURGERY    . MASS EXCISION Left 01/25/2013   Procedure: EXCISION MASS DORSAL ASPECT LEFT LONG FINGER DISTAL INTERPHALANGEAL JOINT;  Surgeon: Wyn Forster., MD;  Location: [facility];  Service: Orthopedics;  Laterality: Left;  Left long   . MOUTH SURGERY       Medications:  Outpatient Encounter Prescriptions as of 06/11/2016  Medication Sig Note  . albuterol (PROVENTIL HFA;VENTOLIN HFA) 108 (90 Base) MCG/ACT inhaler Inhale 2 puffs into the lungs every 4 (four) hours as needed for wheezing or shortness of breath.   Marland Kitchen buPROPion (WELLBUTRIN SR) 150 MG 12 hr tablet Take 1 tablet (150 mg total) by mouth 2 (two) times daily.   Marland Kitchen Dexlansoprazole 30 MG capsule Take 1 capsule (  30 mg total) by mouth daily.   Marland Kitchen. gabapentin (NEURONTIN) 300 MG capsule Take 1 capsule (300 mg total) by mouth 3 (three) times daily.   Marland Kitchen. ibuprofen (ADVIL,MOTRIN) 800 MG tablet Take 1 tablet (800 mg total) by mouth 2 (two) times daily as needed for moderate pain.   Marland Kitchen. lidocaine (XYLOCAINE) 5 % ointment Apply 1 application topically as needed.   Marland Kitchen. lisinopril (PRINIVIL,ZESTRIL) 10 MG tablet Take 1 tablet (10 mg total) by mouth daily.   . Multiple Vitamin (MULTIVITAMIN WITH MINERALS) TABS tablet Take 1 tablet by mouth daily.   . nortriptyline (PAMELOR) 25 MG capsule Take 1 capsule (25 mg  total) by mouth at bedtime. (Patient taking differently: Take 75 mg by mouth at bedtime. )   . pantoprazole (PROTONIX) 40 MG tablet Take 30 minutes before breakfast and before supper.   . ranitidine (ZANTAC) 150 MG tablet Take 1 tablet (150 mg total) by mouth at bedtime. 01/17/2014: .............  . traZODone (DESYREL) 50 MG tablet TAKE 1/2 TO 1 TABLET BY MOUTH AT BEDTIME AS NEEDED FOR SLEEP   . traMADol (ULTRAM) 50 MG tablet Take 50 mg by mouth daily.    No facility-administered encounter medications on file as of 06/11/2016.      Allergies:  Allergies  Allergen Reactions  . Aspirin Other (See Comments)    Tinnitus, dizzy, short of breath  . Codeine Itching    Reaction to tylenol #3  . Penicillins Anaphylaxis  . Amitriptyline Rash    Family History: Family History  Problem Relation Age of Onset  . Hyperlipidemia Father   . Hypertension Father   . Anxiety disorder Father   . Drug abuse Father   . Cancer Paternal Grandfather     lung, colon  . Colon cancer Paternal Grandfather   . Lung cancer Paternal Grandfather   . Diabetes Paternal Grandmother   . Cancer Paternal Grandmother   . Cancer Maternal Grandmother   . Cancer Maternal Grandfather   . Lung cancer Maternal Grandfather   . Stroke    . Heart disease    . Depression Paternal Aunt   . Anxiety disorder Paternal Aunt   . Drug abuse Paternal Uncle     Social History: Social History  Substance Use Topics  . Smoking status: Current Every Day Smoker    Packs/day: 1.00    Years: 23.00    Types: Cigarettes, E-cigarettes  . Smokeless tobacco: Current User  . Alcohol use 3.6 oz/week    6 Cans of beer per week     Comment: occasional   Social History   Social History Narrative   05/24/12  [name] was born and grew up in [place]. He has 2 sisters. He reports that his childhood was "lousy." He completed the 10th grade. He has been married for 5 years, and is currently separated for 3 weeks. He has a daughter  who is 383-1/73 years old. He has been unemployed for 2 years, and is disabled due to an on-the-job work accident. He is currently living with his aunt and cousin. He reports that his hobbies are sports and dogs. He reports that he is spiritual, but not religious. He states that his aunt and his father are his social support system. He denies any current legal problems, but got a DUI in 2007. 05/24/12 [facility]          Review of Systems:  CONSTITUTIONAL: No fevers, chills, night sweats, or weight loss.   EYES: No visual changes  or eye pain ENT: No hearing changes.  No history of nose bleeds.   RESPIRATORY: No cough, wheezing and shortness of breath.   CARDIOVASCULAR: Negative for chest pain, and palpitations.   GI: Negative for abdominal discomfort, blood in stools or black stools.  No recent change in bowel habits.   GU:  No history of incontinence.   MUSCLOSKELETAL: +history of joint pain or swelling.  No myalgias.   SKIN: Negative for lesions, rash, and itching.   HEMATOLOGY/ONCOLOGY: Negative for prolonged bleeding, bruising easily, and swollen nodes.  No history of cancer.   ENDOCRINE: Negative for cold or heat intolerance, polydipsia or goiter.   PSYCH:  +depression or anxiety symptoms.   NEURO: As Above.   Vital Signs:  BP 114/68 (BP Location: Left Arm, Patient Position: Sitting, Cuff Size: Large)   Pulse (!) 104   Temp 98.2 F (36.8 C) (Oral)   Resp 18   Ht 5\' 11"  (1.803 m) Comment: w/shoes  Wt 224 lb 3.2 oz (101.7 kg) Comment: w/shoes  SpO2 98%   BMI 31.27 kg/m 10  Neurological Exam: MENTAL STATUS including orientation to time, place, person, recent and remote memory, attention span and concentration, language, and fund of knowledge is normal.  Speech is not dysarthric.  CRANIAL NERVES: II:  No visual field defects.   III-IV-VI: Pupils equal round and reactive to light.  Normal conjugate, extra-ocular eye movements in all directions of gaze.  No nystagmus.  No ptosis.   V:   Normal facial sensation.     VII:  Normal facial symmetry and movements.  VIII:  Normal hearing and vestibular function.   IX-X:  Normal palatal movement.   XI:  Normal shoulder shrug and head rotation.   XII:  Normal tongue strength and range of motion, no deviation or fasciculation.  MOTOR:  The dorsum of the left foot has purple hue distally where is previous injury was and is cold to touch.  There is mild atrophy of the intrinsic feet muscles.  No fasciculations or abnormal movements.  No pronator drift.  Tone is normal.  Motor strength is 5/5 throughout, only trace weakness with left toe flexion.    MSRs:   Reflexes are 2+/4 throughout, including Achilles bilaterally.  Plantars are down going.   SENSORY:  Over the dorsum of the left foot, there is hyperesthesia to light touch, vibration, and temperature.  Pin prick is reduced.  Sensation elsewhere is intact.   COORDINATION/GAIT: Normal finger-to- nose-finger. Intact rapid alternating movements bilaterally.  Gait is antalgic favoring the right side, stable and unassisted.     IMPRESSION: Mr. Rudell Cobb is a pleasant 36 year-old gentleman referred for evaluation of left foot pain following crush injury to his foot in 2005.  His symptoms are consistent with complex regional pain syndrome, type 1.  He has many questions regarding management options and I explained that we do not management CRPS in our office and he would be better suited to see Pain Management for this condition.  Further, we are a non-opioid prescribing practice.  We discussed that there are no neurological interventions that would heal the underlying condition and goal of management is to control pain.  I will refer him to Pain Management for ongoing care.  The duration of this appointment visit was 35 minutes of face-to-face time with the patient.  Greater than 50% of this time was spent in counseling, explanation of diagnosis, planning of further management, and coordination  of care.   Thank you for  allowing me to participate in patient's care.  If I can answer any additional questions, I would be pleased to do so.    Sincerely,    Donika K. Posey Pronto, DO

## 2016-06-12 ENCOUNTER — Telehealth: Payer: Self-pay | Admitting: Neurology

## 2016-06-12 NOTE — Telephone Encounter (Signed)
She was calling regarding his referral to their office. They are not in network and he will have to pay out of pocket. She wanted to see if we could find some one in network? She would like you to call her back tomorrow and to let patient know what you find out.

## 2016-06-17 NOTE — Telephone Encounter (Signed)
I will send to another pain specialist.

## 2016-06-25 ENCOUNTER — Telehealth: Payer: Self-pay | Admitting: Neurology

## 2016-06-25 NOTE — Telephone Encounter (Signed)
Patient states that he has not heard from the place that we referred him to please call

## 2016-06-26 ENCOUNTER — Ambulatory Visit (INDEPENDENT_AMBULATORY_CARE_PROVIDER_SITE_OTHER): Payer: 59 | Admitting: Physician Assistant

## 2016-06-26 VITALS — BP 142/84 | HR 99 | Temp 97.7°F | Resp 16 | Ht 71.0 in | Wt 228.0 lb

## 2016-06-26 DIAGNOSIS — G90522 Complex regional pain syndrome I of left lower limb: Secondary | ICD-10-CM

## 2016-06-26 MED ORDER — MELOXICAM 15 MG PO TABS
15.0000 mg | ORAL_TABLET | Freq: Every day | ORAL | 0 refills | Status: DC
Start: 2016-06-26 — End: 2016-07-16

## 2016-06-26 MED ORDER — TRAMADOL HCL 50 MG PO TABS: 50.0000 mg | ORAL_TABLET | Freq: Three times a day (TID) | ORAL | 0 refills | Status: DC | PRN

## 2016-06-26 NOTE — Patient Instructions (Addendum)
I would like you to increase your gabapentin to  three times per day for 1 week.  You can then increase to  three times per week if the pain does not improve.   Also given mobic.  This is an anti-inflammatory that you do NOT take naproxen or ibuprofen with.  You can take tylenol.  I am referring you to integrative therapy, please await that call.   Complex Regional Pain Syndrome Complex regional pain syndrome (CRPS) is a nerve disorder that causes long-lasting (chronic) pain, usually in a hand, arm, leg, or foot. CRPS usually follows an injury or trauma, such as a fracture or sprain. There are two types of CRPS:  Type 1. This type occurs after an injury or trauma with no known damage to a nerve.  Type 2. This type occurs after injury or trauma damages a nerve. There are three stages of the condition:  Stage 1. This stage, called the acute stage, may last for three months.  Stage 2. This stage, called the dystrophic stage, may last for three to 12 months.  Stage 3. This stage, called the atrophic stage, may start after one year. CRPS ranges from mild to severe. For most people CRPS is mild and recovery happens over time. For others, CRPS lasts a very long time and is debilitating. What are the causes? The exact cause of CRPS is not known. What increases the risk? You may be at increased risk if:  You are a woman.  You are approximately 37 years of age.  You have any of the following:  A family history of CRPS.  An injury or surgery.  An infection.  Cancer.  Neck problems.  A stroke.  A heart attack.  Asthma. What are the signs or symptoms? Signs and symptoms in the affected limb are different for each stage. Signs and symptoms of stage 1 include:  Burning pain.  A pins and needles sensation.  Extremely sensitive skin.  Swelling.  Joint stiffness.  Warmth and redness.  Excessive sweating.  Hair and nail growth that is faster than normal. Signs and  symptoms of stage 2 include:  Spreading of pain to the whole limb.  Increased skin sensitivity.  Increased swelling and stiffness.  Coolness of the skin.  Blue discoloration of skin.  Loss of skin wrinkles.  Brittle fingernails. Signs and symptoms of stage 3 include:  Pain that spreads to other areas of the body but becomes less severe.  More stiffness, leading to loss of motion.  Skin that is pale, dry, shiny, and tightly stretched. How is this diagnosed? There is no test to diagnose CRPS. Your health care provider will make a diagnosis based on your signs and symptoms and a physical exam. The exam may include tests to rule out other possible causes of your symptoms. Sometimes imaging tests are done, such as an MRI or bone scan. These tests check for bone changes that might indicate CRPS. How is this treated? Early treatment may prevent CRPS from advancing past stage 1. There is no one treatment that works for everyone. Treatment options may include:  Medicines, such as:  Nonsteroidal-anti-inflammatory drugs (NSAIDS).  Steroids.  Blood pressure drugs.  Antidepressants.  Anti-seizure drugs.  Pain relievers.  Exercise.  Occupational and physical therapy.  Biofeedback.  Mental health counseling.  Numbing injections.  Spinal surgery to implant a spinal cord stimulator or a pain pump. Follow these instructions at home:  Take medicines only as directed by your health care provider.  Follow an exercise program as directed by your health care provider.  Maintain a healthy weight.  Keep all follow-up visits as directed by your health care provider. This is important. Contact a health care provider if:  Your symptoms change.  Your symptoms get worse.  You develop anxiety or depression. This information is not intended to replace advice given to you by your health care provider. Make sure you discuss any questions you have with your health care  provider. Document Released: 02/28/2002 Document Revised: 08/16/2015 Document Reviewed: 12/05/2013 Elsevier Interactive Patient Education  2017 [employer].    IF you received an x-ray today, you will receive an invoice from [facility]. Please contact [facility] at [phone] with questions or concerns regarding your invoice.   IF you received labwork today, you will receive an invoice from [facility]. Please contact [facility] at [phone] with questions or concerns regarding your invoice.   Our billing staff will not be able to assist you with questions regarding bills from these companies.  You will be contacted with the lab results as soon as they are available. The fastest way to get your results is to activate your My Chart account. Instructions are located on the last page of this paperwork. If you have not heard from [place] regarding the results in 2 weeks, please contact this office.

## 2016-06-26 NOTE — Telephone Encounter (Signed)
Referral sent to [facility] but it is out of network.  Another referral sent to [facility].

## 2016-06-26 NOTE — Progress Notes (Signed)
PRIMARY CARE AT [facility] [address] [place] [address] [phone]  Date:  06/26/2016   Name:  Shane Cole.   DOB:  07-19-1980   MRN:  [id number]  PCP:  Shirline Frees, NP    History of Present Illness:  Shane Arenson. is a 36 y.o. male patient who presents to PCP with  Chief Complaint  Patient presents with  . Ankle Pain    left     Chronic left ankle pain for years.  This has been diagnosed as RSD.  He complains that this continues to be painful for him.  He currently is taking gabapentin  three times per day.  His work has picked up and he has notice increased pain along the top of his foot near toes, and the 5 th toe.  No swelling or added redness.  He is followed by a pain management clinic for his chronic pain, but states that he has not been to them since October 2017 due to cost, he reports.   During the gathering of the history, he also states that he does not want pain management anymore, because he does not want opiate medication.   In review of his medical notes, he has been followed by Physical Medicine and Rehabilitation for pain management at [facility].  He did not show up for his medication count, though there was some discussion that he was upset with a phone call from that office.   He says the pain was well controlled since then, 6 months ago, but over the last month, the pain had worsened.   Patient Active Problem List   Diagnosis Date Noted  . Essential hypertension 02/26/2016  . Adhesive capsulitis of right shoulder 07/16/2015  . Thoracic outlet syndrome 02/12/2015  . Chronic narcotic use 10/26/2014  . Opioid contract exists 10/26/2014  . Cellulitis of buttock, left 09/30/2014  . RSD lower limb 06/30/2014  . Tobacco use disorder 05/11/2014  . Chronic diarrhea 03/29/2012  . Bipolar disorder (HCC) 03/29/2012    Past Medical History:  Diagnosis Date  . Allergy   . Anxiety   . Bipolar disorder (HCC)   . Bronchitis   . Crush injury lower  leg    Left lower leg  . Depression   . Gastric ulcer   . GERD (gastroesophageal reflux disease)    will awaken him in night  . Migraine   . Nerve pain   . RSD (reflex sympathetic dystrophy)     Past Surgical History:  Procedure Laterality Date  . CHOLECYSTECTOMY N/A 01/24/2014   Procedure: LAPAROSCOPIC CHOLECYSTECTOMY;  Surgeon: Axel Filler, MD;  Location: [facility] OR;  Service: General;  Laterality: N/A;  . COLONOSCOPY    . HAND SURGERY    . MASS EXCISION Left 01/25/2013   Procedure: EXCISION MASS DORSAL ASPECT LEFT LONG FINGER DISTAL INTERPHALANGEAL JOINT;  Surgeon: Wyn Forster., MD;  Location: [facility];  Service: Orthopedics;  Laterality: Left;  Left long   . MOUTH SURGERY      Social History  Substance Use Topics  . Smoking status: Current Every Day Smoker    Packs/day: 1.00    Years: 23.00    Types: Cigarettes, E-cigarettes  . Smokeless tobacco: Current User  . Alcohol use 3.6 oz/week    6 Cans of beer per week     Comment: occasional    Family History  Problem Relation Age of Onset  . Hyperlipidemia Father   . Hypertension Father   .  Anxiety disorder Father   . Drug abuse Father   . Cancer Paternal Grandfather     lung, colon  . Colon cancer Paternal Grandfather   . Lung cancer Paternal Grandfather   . Diabetes Paternal Grandmother   . Cancer Paternal Grandmother   . Cancer Maternal Grandmother   . Cancer Maternal Grandfather   . Lung cancer Maternal Grandfather   . Stroke    . Heart disease    . Depression Paternal Aunt   . Anxiety disorder Paternal Aunt   . Drug abuse Paternal Uncle     Allergies  Allergen Reactions  . Aspirin Other (See Comments)    Tinnitus, dizzy, short of breath  . Codeine Itching    Reaction to tylenol #3  . Penicillins Anaphylaxis  . Amitriptyline Rash    Medication list has been reviewed and updated.  Current Outpatient Prescriptions on File Prior to Visit  Medication Sig Dispense Refill  .  buPROPion (WELLBUTRIN SR) 150 MG 12 hr tablet Take 1 tablet (150 mg total) by mouth 2 (two) times daily. 180 tablet 1  . Dexlansoprazole 30 MG capsule Take 1 capsule (30 mg total) by mouth daily. 30 capsule 3  . gabapentin (NEURONTIN) 300 MG capsule Take 1 capsule (300 mg total) by mouth 3 (three) times daily. 270 capsule 3  . ibuprofen (ADVIL,MOTRIN) 800 MG tablet Take 1 tablet (800 mg total) by mouth 2 (two) times daily as needed for moderate pain. 60 tablet 1  . lidocaine (XYLOCAINE) 5 % ointment Apply 1 application topically as needed. 35.44 g 2  . Multiple Vitamin (MULTIVITAMIN WITH MINERALS) TABS tablet Take 1 tablet by mouth daily.    . nortriptyline (PAMELOR) 25 MG capsule Take 1 capsule (25 mg total) by mouth at bedtime. (Patient taking differently: Take 75 mg by mouth at bedtime. ) 30 capsule 1  . ranitidine (ZANTAC) 150 MG tablet Take 1 tablet (150 mg total) by mouth at bedtime. (Patient not taking: Reported on 06/26/2016) 30 tablet 2  . traMADol (ULTRAM) 50 MG tablet Take 50 mg by mouth daily.  0   No current facility-administered medications on file prior to visit.     ROS ROS otherwise unremarkable unless listed above.  Physical Examination: BP (!) 165/95   Pulse 99   Temp 97.7 F (36.5 C) (Oral)   Resp 16   Ht  (1.803 m)   Wt 228 lb (103.4 kg)   SpO2 99%   BMI 31.80 kg/m  Ideal Body Weight: Weight in (lb) to have BMI = 25: 178.9  Physical Exam  Constitutional: He is oriented to person, place, and time. He appears well-developed and well-nourished. No distress.  HENT:  Head: Normocephalic and atraumatic.  Eyes: Conjunctivae and EOM are normal. Pupils are equal, round, and reactive to light.  Cardiovascular: Normal rate.   Pulmonary/Chest: Effort normal. No respiratory distress.  Musculoskeletal:  Soft touch palpation expresses discomfort and tenderness along the dorsum of the mtp.  Tenderness with general palpation present as well however varied in pain.     Neurological: He is alert and oriented to person, place, and time.  Antalgic gait  Skin: Skin is warm and dry. He is not diaphoretic.  Psychiatric: He has a normal mood and affect. His behavior is normal.   Assessment and Plan: Shane Buchler. is a 35 y.o. male who is here today for foot pain  --advised to increase gabapentin to  TID. --given meloxicam --at the end, he request medicine  for pain.  At this time, I have advised that he should follow up with his pcp.  He states that he will not be going there.   --advised to get integrative therapy for choices, and he should return to pain clinic.  He voiced understanding, and was cooperative.  Complex regional pain syndrome type 1 of left lower extremity - Plan: Ambulatory referral to Integrative Medicine  Trena Platt, PA-C [facility] 4/7/20182:14 PM

## 2016-07-07 DIAGNOSIS — G90521 Complex regional pain syndrome I of right lower limb: Secondary | ICD-10-CM | POA: Diagnosis not present

## 2016-07-14 ENCOUNTER — Telehealth: Payer: Self-pay

## 2016-07-15 NOTE — Telephone Encounter (Signed)
Pt states he has had blood and mucous in his stools for about a week. Also states he is having pain in his RUQ. States the pain is like what he had before having his gallbladder removed. Pts appt moved to tomorrow with Shane Cluster NP at 9:30am. Pt aware of appt now 07/16/16@9 :30am.

## 2016-07-16 ENCOUNTER — Telehealth: Payer: Self-pay | Admitting: Nurse Practitioner

## 2016-07-16 ENCOUNTER — Ambulatory Visit: Payer: 59 | Admitting: Nurse Practitioner

## 2016-07-16 ENCOUNTER — Ambulatory Visit (INDEPENDENT_AMBULATORY_CARE_PROVIDER_SITE_OTHER): Payer: 59 | Admitting: Nurse Practitioner

## 2016-07-16 ENCOUNTER — Encounter: Payer: Self-pay | Admitting: Nurse Practitioner

## 2016-07-16 DIAGNOSIS — G8929 Other chronic pain: Secondary | ICD-10-CM | POA: Diagnosis not present

## 2016-07-16 DIAGNOSIS — R1013 Epigastric pain: Secondary | ICD-10-CM | POA: Diagnosis not present

## 2016-07-16 DIAGNOSIS — K219 Gastro-esophageal reflux disease without esophagitis: Secondary | ICD-10-CM | POA: Diagnosis not present

## 2016-07-16 DIAGNOSIS — K648 Other hemorrhoids: Secondary | ICD-10-CM | POA: Diagnosis not present

## 2016-07-16 DIAGNOSIS — K625 Hemorrhage of anus and rectum: Secondary | ICD-10-CM | POA: Diagnosis not present

## 2016-07-16 MED ORDER — SUCRALFATE 1 G PO TABS: 1.0000 g | ORAL_TABLET | Freq: Three times a day (TID) | ORAL | 1 refills | Status: DC

## 2016-07-16 MED ORDER — HYDROCORTISONE ACETATE 25 MG RE SUPP: 25.0000 mg | Freq: Every day | RECTAL | 0 refills | Status: DC

## 2016-07-16 NOTE — Telephone Encounter (Signed)
Please advise and let me know if you want me to send an rx in.

## 2016-07-16 NOTE — Patient Instructions (Addendum)
If you are age 37 or older, your body mass index should be between 23-30. Your Body mass index is 30.96 kg/m. If this is out of the aforementioned range listed, please consider follow up with your Primary Care Provider.  If you are age 28 or younger, your body mass index should be between 19-25. Your Body mass index is 30.96 kg/m. If this is out of the aformentioned range listed, please consider follow up with your Primary Care Provider.   We have sent medications to your pharmacy for you to pick up at your convenience.  Follow up appointment with Dr. Christella Hartigan 09/08/16 at 345 pm.  Thank you for choosing me and [facility].  Willette Cluster, NP

## 2016-07-16 NOTE — Progress Notes (Signed)
     HPI: Patient is a 36 year old male known to Dr. Christella Hartigan. He has a history of GERD and chronic diarrhea. EGD Nov 2015 with findings of mild erosive esophagitis. Colonoscopy for evaluation of diarrhea November 2015 was normal except for a few diverticula. Random biopsies were negative.   Patient has multiple medical complaints. He has had loose stools for years but recently began having blood and mucous in stool. No rectal pain. He has chronic abdominal pain.  He takes Ibuprofen but has done so for years. No black stools. He recently changed jobs and is doing a lot of heavy lifting.  Patient has chronic right periumbilical  pain not related to eating or bowel movements. Pain occurs randomly, sometimes even wakes him up at night. Pain is hot, stabbing and very transient lasting no more than 30 seconds or so.  He has chronic nausea. Had a CTscan in 2012 and 2015 for same pain, no acute findings.   Patient complains of severe heartburn. He takes Dexilant and Zantac and follows anti-reflux measures.  Tried to eat healthy but still gets horrific heartburn.   Past Medical History:  Diagnosis Date  . Allergy   . Anxiety   . Bipolar disorder (HCC)   . Bronchitis   . Crush injury lower leg    Left lower leg  . Depression   . Gastric ulcer   . GERD (gastroesophageal reflux disease)    will awaken him in night  . Migraine   . Nerve pain   . RSD (reflex sympathetic dystrophy)     Patient's surgical history, family medical history, social history, medications and allergies were all reviewed in Epic    Physical Exam: BP 110/80   Pulse (!) 103   Ht  (1.803 m)   Wt 222 lb (100.7 kg)   BMI 30.96 kg/m   GENERAL: white male in NAD PSYCH: :Pleasant, cooperative, normal affect HEENT: cojunctiva pink, mucous membranes moist, neck supple without masses CARDIAC:  RRR, no murmur heard, no peripheral edema PULM: Normal respiratory effort, lungs CTA bilaterally, no wheezing ABDOMEN:  soft,  nontender, nondistended, no obvious masses, no hepatomegaly,  normal bowel sounds SKIN:  turgor, no lesions seen Musculoskeletal: normal muscle tone, normal strength NEURO: Alert and oriented x 3, no focal neurologic deficits    ASSESSMENT and PLAN:  1. 36 yo male with chronic loose stools, patient believes started following cholecystectomy. Actually we evaluated him in 2014 for diarrhea and colonoscopy with biopsies was normal. No change in bowels but recently started having blood and mucous in stools. Started new job, heavy lifting. He has internal hemorrhoids on anoscopy today. Suspect bleeding secondary to hemorrhoids.   -anusol nightly for 7 days -follow up in in 4-6 weeks, or sooner if bleeding persists or recurs  2. Chronic intermittent hot and stabbing periumbilical pain. Pain present for years, work up including CTscans / colonoscopy negative.  -Pain may be musculoskeletal or neuropathic. He already takes gabapentin, nortritylline as well as ibuprofen. Unfortunately I had nothing to offer him.    3. GERD.  Frequent pyrosis on BID PPI plus Zanac 2-3 times a day,  and follows anti-reflux measures.   -trial of Carafate  -consider pH study, hard to believe pain all from GERD given amount of acid suppression .    Willette Cluster , NP 07/16/2016, 10:00 AM

## 2016-07-21 ENCOUNTER — Ambulatory Visit: Payer: 59 | Admitting: Nurse Practitioner

## 2016-07-21 NOTE — Progress Notes (Signed)
I agree with the above note, plan 

## 2016-07-21 NOTE — Telephone Encounter (Signed)
Verlon Au, he can have zofran  one Q 8 prn #20 with one refill. Thanks

## 2016-07-22 ENCOUNTER — Telehealth: Payer: Self-pay

## 2016-07-22 MED ORDER — ONDANSETRON HCL 4 MG PO TABS: 4.0000 mg | ORAL_TABLET | Freq: Three times a day (TID) | ORAL | 1 refills | Status: DC | PRN

## 2016-07-22 NOTE — Telephone Encounter (Signed)
Called patient to see if he still needed Zofran for nausea since I just heard back from [facility] about it but no answer and phone does not have voicemail.

## 2016-07-22 NOTE — Telephone Encounter (Signed)
Spoke with patient and told him [facility] said he could have Zofran for his nausea.  Patient understood.  Sent Zofran to [facility]

## 2016-08-13 ENCOUNTER — Encounter: Payer: Self-pay | Admitting: *Deleted

## 2016-08-26 DIAGNOSIS — M5412 Radiculopathy, cervical region: Secondary | ICD-10-CM | POA: Diagnosis not present

## 2016-08-26 DIAGNOSIS — G90529 Complex regional pain syndrome I of unspecified lower limb: Secondary | ICD-10-CM | POA: Diagnosis not present

## 2016-08-26 DIAGNOSIS — G5602 Carpal tunnel syndrome, left upper limb: Secondary | ICD-10-CM | POA: Diagnosis not present

## 2016-08-26 DIAGNOSIS — M5417 Radiculopathy, lumbosacral region: Secondary | ICD-10-CM | POA: Diagnosis not present

## 2016-08-26 DIAGNOSIS — R202 Paresthesia of skin: Secondary | ICD-10-CM | POA: Diagnosis not present

## 2016-08-26 DIAGNOSIS — G9050 Complex regional pain syndrome I, unspecified: Secondary | ICD-10-CM | POA: Diagnosis not present

## 2016-08-26 DIAGNOSIS — R201 Hypoesthesia of skin: Secondary | ICD-10-CM | POA: Diagnosis not present

## 2016-09-08 ENCOUNTER — Ambulatory Visit: Payer: 59 | Admitting: Gastroenterology

## 2016-09-16 DIAGNOSIS — G90529 Complex regional pain syndrome I of unspecified lower limb: Secondary | ICD-10-CM | POA: Diagnosis not present

## 2016-09-16 DIAGNOSIS — M5417 Radiculopathy, lumbosacral region: Secondary | ICD-10-CM | POA: Diagnosis not present

## 2016-09-16 DIAGNOSIS — M5412 Radiculopathy, cervical region: Secondary | ICD-10-CM | POA: Diagnosis not present

## 2016-09-16 DIAGNOSIS — G5602 Carpal tunnel syndrome, left upper limb: Secondary | ICD-10-CM | POA: Diagnosis not present

## 2016-09-16 DIAGNOSIS — R201 Hypoesthesia of skin: Secondary | ICD-10-CM | POA: Diagnosis not present

## 2016-10-02 ENCOUNTER — Telehealth: Payer: Self-pay | Admitting: Family Medicine

## 2016-10-02 NOTE — Telephone Encounter (Signed)
Pt called in disputing a bill.   Please Advise  [phone]

## 2016-10-21 DIAGNOSIS — Z79899 Other long term (current) drug therapy: Secondary | ICD-10-CM | POA: Diagnosis not present

## 2016-10-21 DIAGNOSIS — R202 Paresthesia of skin: Secondary | ICD-10-CM | POA: Diagnosis not present

## 2016-10-21 DIAGNOSIS — G5602 Carpal tunnel syndrome, left upper limb: Secondary | ICD-10-CM | POA: Diagnosis not present

## 2016-10-21 DIAGNOSIS — M5417 Radiculopathy, lumbosacral region: Secondary | ICD-10-CM | POA: Diagnosis not present

## 2016-10-21 DIAGNOSIS — G90529 Complex regional pain syndrome I of unspecified lower limb: Secondary | ICD-10-CM | POA: Diagnosis not present

## 2016-10-21 DIAGNOSIS — M542 Cervicalgia: Secondary | ICD-10-CM | POA: Diagnosis not present

## 2016-10-26 DIAGNOSIS — R251 Tremor, unspecified: Secondary | ICD-10-CM | POA: Diagnosis not present

## 2016-10-26 DIAGNOSIS — Z880 Allergy status to penicillin: Secondary | ICD-10-CM | POA: Diagnosis not present

## 2016-10-26 DIAGNOSIS — F1510 Other stimulant abuse, uncomplicated: Secondary | ICD-10-CM | POA: Diagnosis not present

## 2016-10-26 DIAGNOSIS — R61 Generalized hyperhidrosis: Secondary | ICD-10-CM | POA: Diagnosis not present

## 2016-10-26 DIAGNOSIS — F17210 Nicotine dependence, cigarettes, uncomplicated: Secondary | ICD-10-CM | POA: Diagnosis not present

## 2016-10-26 DIAGNOSIS — R079 Chest pain, unspecified: Secondary | ICD-10-CM | POA: Diagnosis not present

## 2016-10-26 DIAGNOSIS — Z886 Allergy status to analgesic agent status: Secondary | ICD-10-CM | POA: Diagnosis not present

## 2016-10-26 DIAGNOSIS — F419 Anxiety disorder, unspecified: Secondary | ICD-10-CM | POA: Diagnosis not present

## 2016-10-26 DIAGNOSIS — R002 Palpitations: Secondary | ICD-10-CM | POA: Diagnosis not present

## 2016-11-26 ENCOUNTER — Emergency Department (HOSPITAL_COMMUNITY): Payer: 59

## 2016-11-26 ENCOUNTER — Emergency Department (HOSPITAL_COMMUNITY)
Admission: EM | Admit: 2016-11-26 | Discharge: 2016-11-26 | Disposition: A | Discharge status: Home / Self Care | Payer: 59 | Attending: Emergency Medicine | Admitting: Emergency Medicine

## 2016-11-26 ENCOUNTER — Encounter: Payer: Self-pay | Admitting: Adult Health

## 2016-11-26 ENCOUNTER — Ambulatory Visit (INDEPENDENT_AMBULATORY_CARE_PROVIDER_SITE_OTHER): Payer: 59 | Admitting: Adult Health

## 2016-11-26 VITALS — BP 118/76 | Temp 98.3°F | Wt 212.0 lb

## 2016-11-26 DIAGNOSIS — F17220 Nicotine dependence, chewing tobacco, uncomplicated: Secondary | ICD-10-CM | POA: Diagnosis not present

## 2016-11-26 DIAGNOSIS — F17210 Nicotine dependence, cigarettes, uncomplicated: Secondary | ICD-10-CM | POA: Insufficient documentation

## 2016-11-26 DIAGNOSIS — R110 Nausea: Secondary | ICD-10-CM | POA: Insufficient documentation

## 2016-11-26 DIAGNOSIS — I10 Essential (primary) hypertension: Secondary | ICD-10-CM | POA: Diagnosis not present

## 2016-11-26 DIAGNOSIS — R1032 Left lower quadrant pain: Secondary | ICD-10-CM | POA: Diagnosis not present

## 2016-11-26 DIAGNOSIS — N50811 Right testicular pain: Secondary | ICD-10-CM | POA: Diagnosis not present

## 2016-11-26 DIAGNOSIS — N50812 Left testicular pain: Secondary | ICD-10-CM | POA: Insufficient documentation

## 2016-11-26 DIAGNOSIS — K5730 Diverticulosis of large intestine without perforation or abscess without bleeding: Secondary | ICD-10-CM | POA: Diagnosis not present

## 2016-11-26 DIAGNOSIS — Z79899 Other long term (current) drug therapy: Secondary | ICD-10-CM | POA: Insufficient documentation

## 2016-11-26 LAB — URINALYSIS, ROUTINE W REFLEX MICROSCOPIC
Bilirubin Urine: NEGATIVE
Glucose, UA: NEGATIVE mg/dL
Hgb urine dipstick: NEGATIVE
Ketones, ur: NEGATIVE mg/dL
Leukocytes, UA: NEGATIVE
Nitrite: NEGATIVE
Protein, ur: NEGATIVE mg/dL
Specific Gravity, Urine: 1.024 (ref 1.005–1.030)
pH: 5 (ref 5.0–8.0)

## 2016-11-26 LAB — RAPID URINE DRUG SCREEN, HOSP PERFORMED
AMPHETAMINES: NOT DETECTED
BENZODIAZEPINES: NOT DETECTED
Barbiturates: NOT DETECTED
COCAINE: NOT DETECTED
Opiates: POSITIVE — AB
Tetrahydrocannabinol: NOT DETECTED

## 2016-11-26 IMAGING — CT CT RENAL STONE PROTOCOL
2 of 3 series · 16 of 46 positions shown, 18 images · non-contrast
Comparison: Abdominal CT [DATE]

CLINICAL DATA: Flank pain, stone disease suspected. Left testicular
pain and swelling.

EXAM:
CT ABDOMEN AND PELVIS WITHOUT CONTRAST
TECHNIQUE: Multidetector CT imaging of the abdomen and pelvis was performed
following the standard protocol without IV contrast.

[Series 3: coronal · coronal · 0.86mm/px · 3 of 163 slices shown]
[im 55/163  soft-tissue]
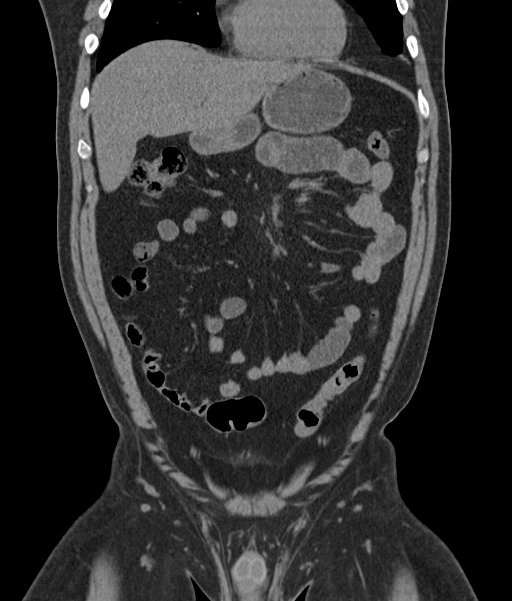
[im 73/163  soft-tissue]
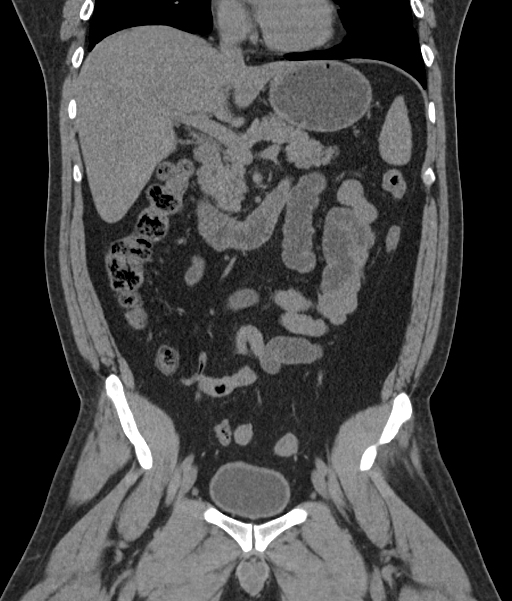
[im 91/163  soft-tissue]
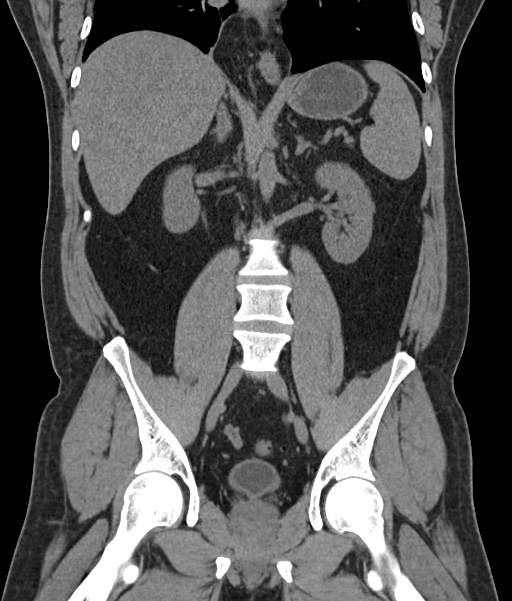

[Series 6: lung · axial · 0.74mm/px · z∈[-344,-226]mm · 13 of 69 slices shown, 15 images]
[im 5/69  soft-tissue]
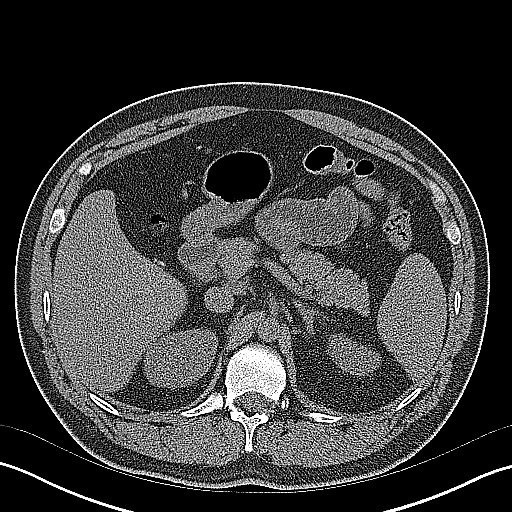
[im 5/69  bone]
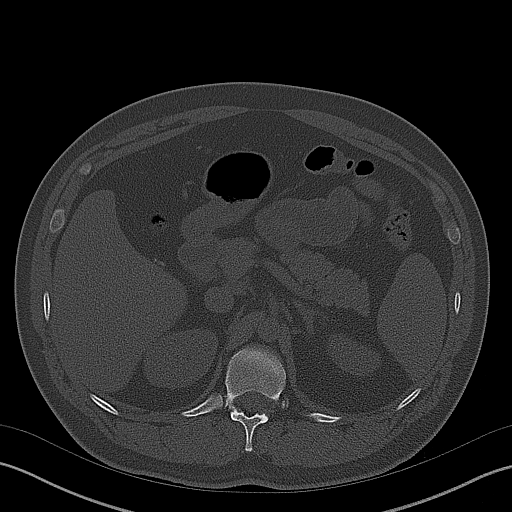
[im 9/69  soft-tissue]
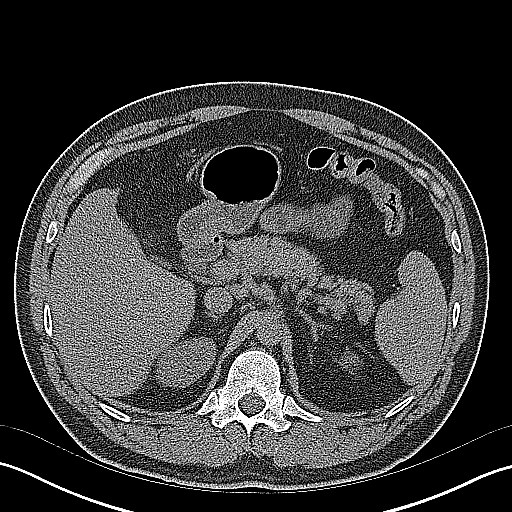
[im 14/69  soft-tissue]
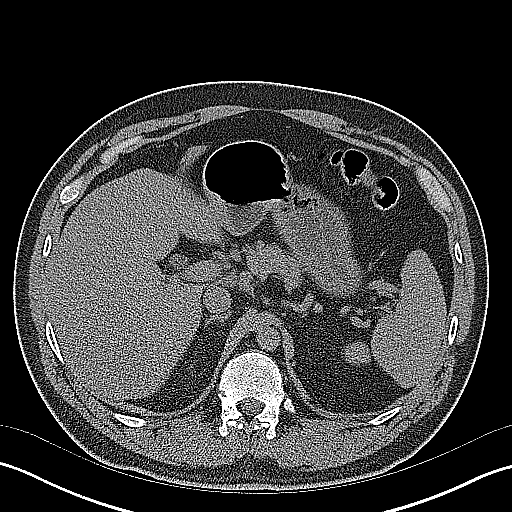
[im 20/69  soft-tissue]
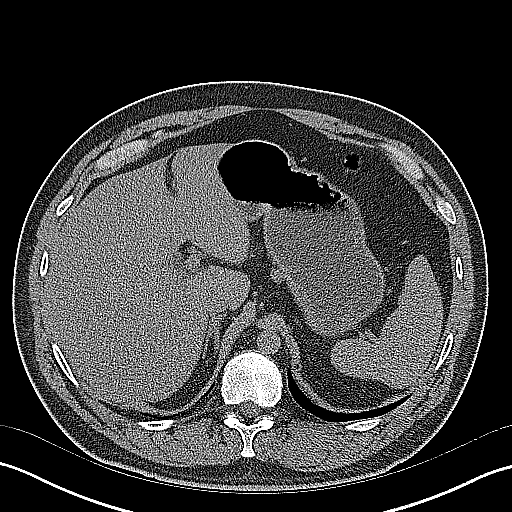
[im 25/69  soft-tissue]
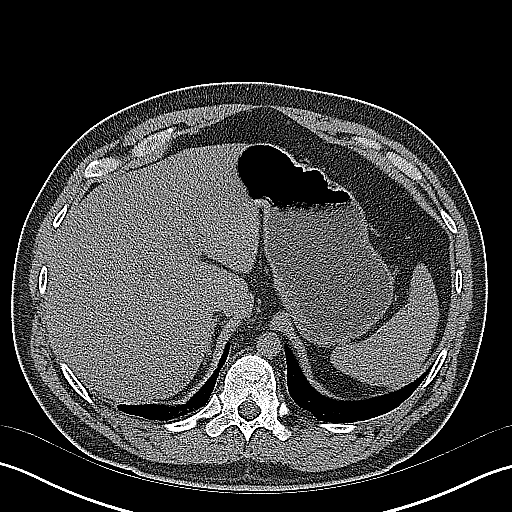
[im 29/69  soft-tissue]
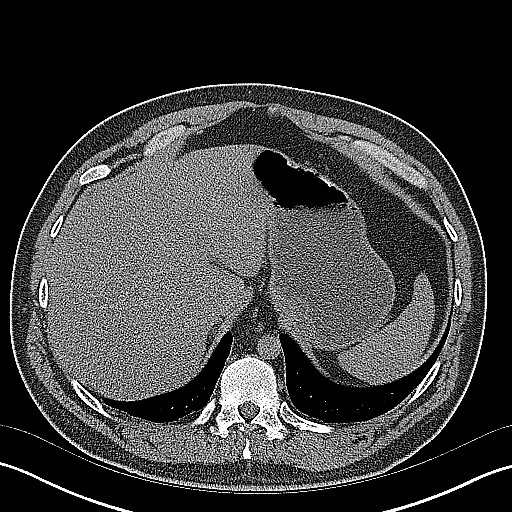
[im 36/69  soft-tissue]
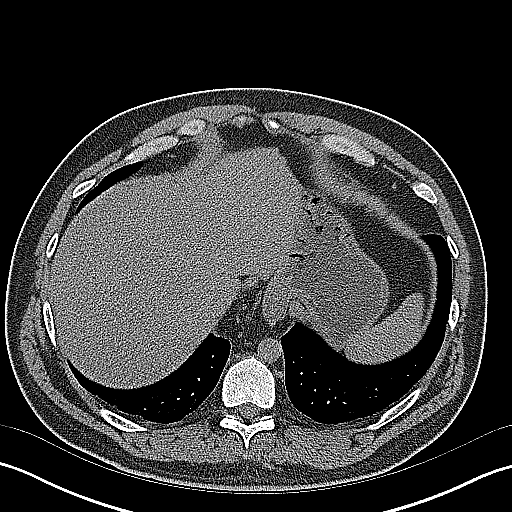
[im 40/69  soft-tissue]
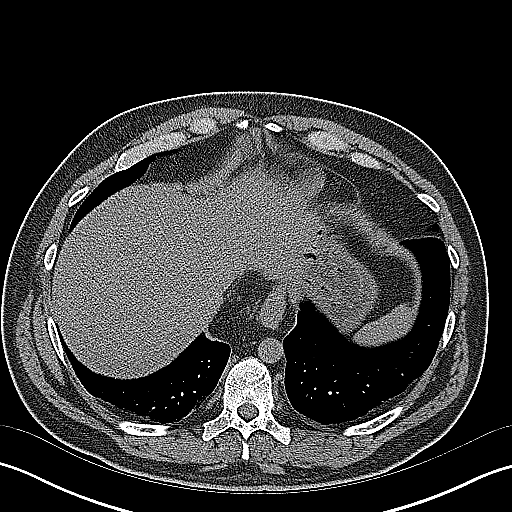
[im 44/69  soft-tissue]
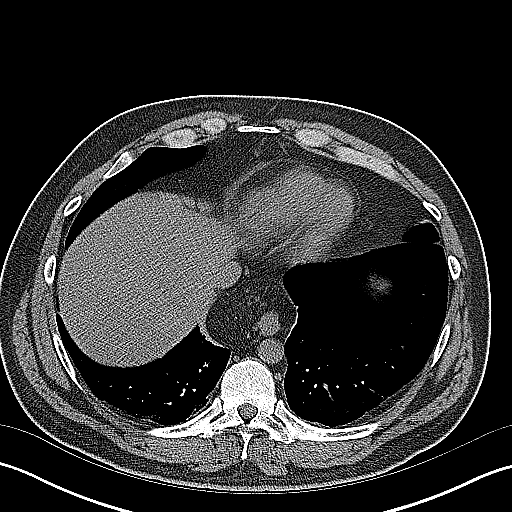
[im 44/69  bone]
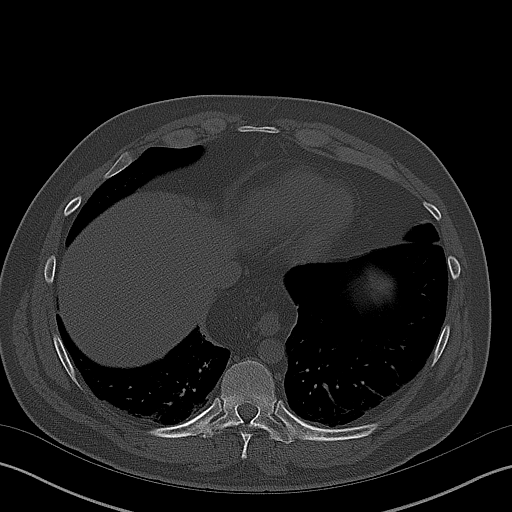
[im 49/69  soft-tissue]
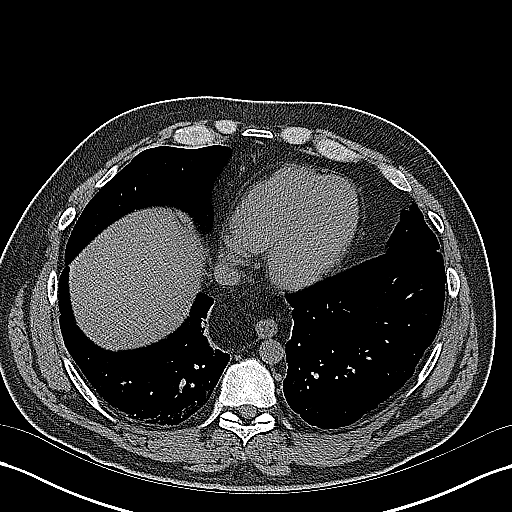
[im 55/69  soft-tissue]
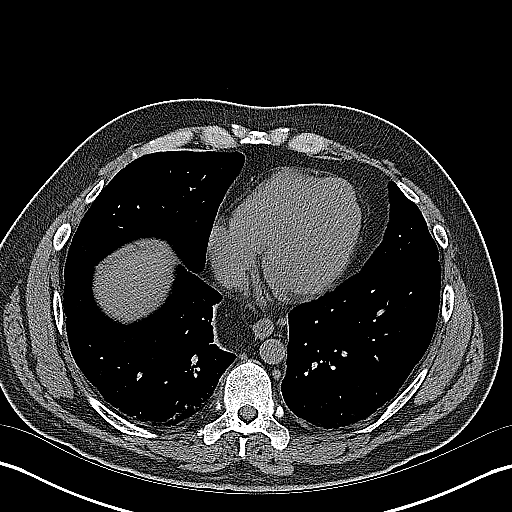
[im 60/69  soft-tissue]
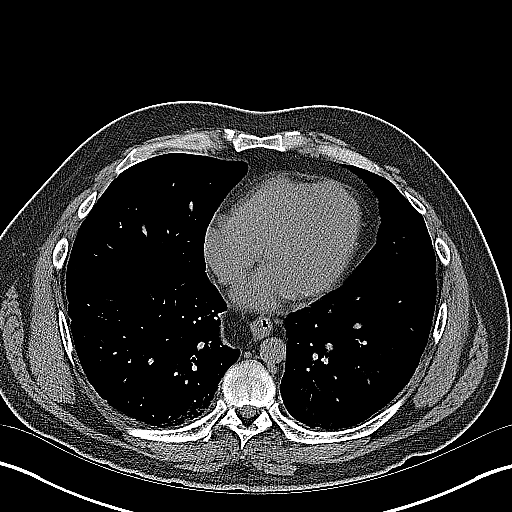
[im 64/69  soft-tissue]
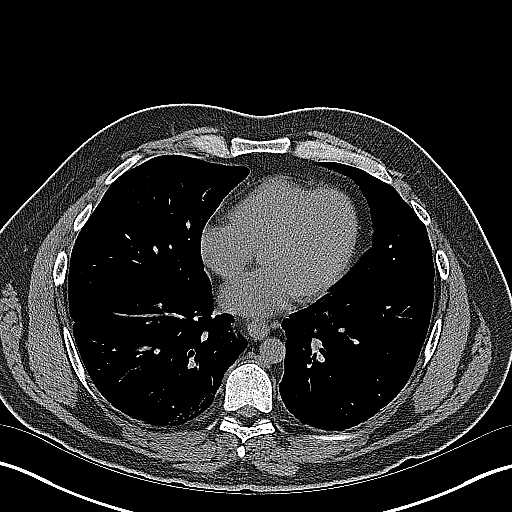

[16 of 46 positions shown; findings below may reference images not displayed]

FINDINGS: Lower chest: Mild dependent atelectasis. Diaphragmatic hiatus with
herniation of fat and small lymph nodes, similar to prior exam.

Hepatobiliary: The degree of hepatic steatosis has diminished from
prior exam. Postcholecystectomy. Hyperdensity at the cystic duct
remnant likely postsurgical, less likely cystic duct remnant stones.
No biliary ductal dilatation.

Pancreas: No ductal dilatation or inflammation.

Spleen: Normal in size without focal abnormality.

Adrenals/Urinary Tract: Normal adrenal glands. No hydronephrosis or
perinephric edema. No urolithiasis. Both ureters are decompressed
without stones along the course. Urinary bladder is physiologically
distended without wall thickening or bladder stone.

Stomach/Bowel: Stomach physiologically distended. No small bowel
inflammation, obstruction or wall thickening. Normal appendix. Mild
sigmoid colonic diverticulosis without diverticulitis. Small to
moderate colonic stool burden. No colonic wall thickening or
inflammation.

Vascular/Lymphatic: Retroaortic left renal vein. Normal caliber
abdominal aorta. Unchanged small retroperitoneal nodes, likely
reactive.

Reproductive: Prostate is unremarkable.

Other: No free air, free fluid, or intra-abdominal fluid collection.
Tiny fat containing umbilical hernia, unchanged.

Musculoskeletal: There are no acute or suspicious osseous
abnormalities. Stable left acetabular bone island.
IMPRESSION: 1.  No renal stones or obstructive uropathy.
2. No acute abnormality in the abdomen/pelvis.
3. Colonic diverticulosis without acute inflammation.
4. Hepatic steatosis, improved in CT appearance from prior.

## 2016-11-26 MED ORDER — SODIUM CHLORIDE 0.9 % IV BOLUS (SEPSIS)
1000.0000 mL | Freq: Once | INTRAVENOUS | Status: AC
  Administered 2016-11-26: 1000 mL via INTRAVENOUS

## 2016-11-26 MED ORDER — KETOROLAC TROMETHAMINE 15 MG/ML IJ SOLN
15.0000 mg | Freq: Once | INTRAMUSCULAR | Status: AC
  Administered 2016-11-26: 15 mg via INTRAVENOUS
  Filled 2016-11-26: qty 1

## 2016-11-26 MED ORDER — ONDANSETRON HCL 4 MG/2ML IJ SOLN
4.0000 mg | Freq: Once | INTRAMUSCULAR | Status: AC
  Administered 2016-11-26: 4 mg via INTRAVENOUS
  Filled 2016-11-26: qty 2

## 2016-11-26 MED ORDER — HYDROMORPHONE HCL 1 MG/ML IJ SOLN
1.0000 mg | Freq: Once | INTRAMUSCULAR | Status: AC
  Administered 2016-11-26: 1 mg via INTRAVENOUS
  Filled 2016-11-26: qty 1

## 2016-11-26 MED ORDER — MELOXICAM 15 MG PO TABS: 15.0000 mg | ORAL_TABLET | Freq: Every day | ORAL | 0 refills | Status: DC | PRN

## 2016-11-26 NOTE — ED Notes (Signed)
ED Provider at bedside. 

## 2016-11-26 NOTE — Progress Notes (Signed)
Subjective:    Patient ID: Shane Cole., male    DOB: 10/21/1980, 36 y.o.   MRN: [id number]  HPI  36 year old male who  has a past medical history of Allergy; Anxiety; Bipolar disorder (HCC); Bronchitis; Crush injury lower leg; Depression; Gastric ulcer; GERD (gastroesophageal reflux disease); Migraine; Nerve pain; and RSD (reflex sympathetic dystrophy). He presents to the office today for the acute complaint of left sided testicular pain and swelling that started at 3 am, two nights ago. He reports that the pain woke him up from a sound sleep. Pain has been improving slightly over that last 24 hours but he continues to have pretty severe pain and swelling to left testicle. Also feels as though his left testicle " does not hang as low as it did prior to this"  He denies any trauma to the area and has not had any bruising.   Review of Systems See HPI   Past Medical History:  Diagnosis Date  . Allergy   . Anxiety   . Bipolar disorder (HCC)   . Bronchitis   . Crush injury lower leg    Left lower leg  . Depression   . Gastric ulcer   . GERD (gastroesophageal reflux disease)    will awaken him in night  . Migraine   . Nerve pain   . RSD (reflex sympathetic dystrophy)     Social History   Social History  . Marital status: Married    Spouse name: N/A  . Number of children: 1  . Years of education: 10th grade   Occupational History  . Disabled     Previously worked Web designer   Social History Main Topics  . Smoking status: Current Every Day Smoker    Packs/day: 1.00    Years: 23.00    Types: Cigarettes, E-cigarettes  . Smokeless tobacco: Current User  . Alcohol use 3.6 oz/week    6 Cans of beer per week     Comment: occasional  . Drug use: No  . Sexual activity: Yes    Partners: Female   Other Topics Concern  . Not on file   Social History Narrative   05/24/12  Xaiden was born and grew up in [place], [place]. He has 2 sisters. He  reports that his childhood was "lousy." He completed the 10th grade. He has been married for 5 years, and is currently separated for 3 weeks. He has a daughter who is 69-1/2 years old. He has been unemployed for 2 years, and is disabled due to an on-the-job work accident. He is currently living with his aunt and cousin. He reports that his hobbies are sports and dogs. He reports that he is spiritual, but not religious. He states that his aunt and his father are his social support system. He denies any current legal problems, but got a DUI in 2007. 05/24/12 [facility]          Past Surgical History:  Procedure Laterality Date  . CHOLECYSTECTOMY N/A 01/24/2014   Procedure: LAPAROSCOPIC CHOLECYSTECTOMY;  Surgeon: Axel Filler, MD;  Location: [facility] OR;  Service: General;  Laterality: N/A;  . COLONOSCOPY    . HAND SURGERY    . MASS EXCISION Left 01/25/2013   Procedure: EXCISION MASS DORSAL ASPECT LEFT LONG FINGER DISTAL INTERPHALANGEAL JOINT;  Surgeon: Wyn Forster., MD;  Location: [facility];  Service: Orthopedics;  Laterality: Left;  Left long   . MOUTH SURGERY  Family History  Problem Relation Age of Onset  . Hyperlipidemia Father   . Hypertension Father   . Anxiety disorder Father   . Drug abuse Father   . Cancer Paternal Grandfather        lung, colon  . Colon cancer Paternal Grandfather   . Lung cancer Paternal Grandfather   . Diabetes Paternal Grandmother   . Cancer Paternal Grandmother   . Cancer Maternal Grandmother   . Cancer Maternal Grandfather   . Lung cancer Maternal Grandfather   . Stroke Unknown   . Heart disease Unknown   . Depression Paternal Aunt   . Anxiety disorder Paternal Aunt   . Drug abuse Paternal Uncle     Allergies  Allergen Reactions  . Aspirin Other (See Comments)    Tinnitus, dizzy, short of breath  . Codeine Itching    Reaction to tylenol #3  . Penicillins Anaphylaxis  . Amitriptyline Rash    Current Outpatient Prescriptions on  File Prior to Visit  Medication Sig Dispense Refill  . Dexlansoprazole 30 MG capsule Take 1 capsule (30 mg total) by mouth daily. 30 capsule 3  . gabapentin (NEURONTIN) 300 MG capsule Take 1 capsule (300 mg total) by mouth 3 (three) times daily. 270 capsule 3  . lidocaine (XYLOCAINE) 5 % ointment Apply 1 application topically as needed. 35.44 g 2  . Multiple Vitamin (MULTIVITAMIN WITH MINERALS) TABS tablet Take 1 tablet by mouth daily.    . nortriptyline (PAMELOR) 25 MG capsule Take 1 capsule (25 mg total) by mouth at bedtime. (Patient taking differently: Take 75 mg by mouth at bedtime. ) 30 capsule 1  . ondansetron (ZOFRAN) 4 MG tablet Take 1 tablet (4 mg total) by mouth every 8 (eight) hours as needed for nausea or vomiting. 20 tablet 1  . ranitidine (ZANTAC) 150 MG tablet Take 1 tablet (150 mg total) by mouth at bedtime. 30 tablet 2  . sucralfate (CARAFATE) 1 g tablet Take 1 tablet (1 g total) by mouth 4 (four) times daily -  with meals and at bedtime. 120 tablet 1   No current facility-administered medications on file prior to visit.     BP 118/76 (BP Location: Right Arm)   Temp 98.3 F (36.8 C) (Oral)   Wt 212 lb (96.2 kg)   BMI 29.57 kg/m       Objective:   Physical Exam  Constitutional: He is oriented to person, place, and time. He appears well-developed and well-nourished. No distress.  Cardiovascular: Normal rate, regular rhythm, normal heart sounds and intact distal pulses.  Exam reveals no gallop.   No murmur heard. Pulmonary/Chest: Effort normal and breath sounds normal. No respiratory distress. He has no wheezes. He has no rales. He exhibits no tenderness.  Genitourinary: Left testis shows swelling and tenderness.  Genitourinary Comments: Pain throughout left testicle that is more tender at base. Swelling noted throughout. Left testicle is positioned higher than right and appears to be angled  Neurological: He is alert and oriented to person, place, and time.  Skin: Skin  is warm and dry. No rash noted. He is not diaphoretic. No erythema. No pallor.  Psychiatric: He has a normal mood and affect. His behavior is normal. Judgment and thought content normal.  Nursing note and vitals reviewed.     Assessment & Plan:  1. Testicular pain, left - There is concern for testicular torsion. Will send to local ER for further evaluation and US    Shirline Freesory Derrel Moore, NP

## 2016-11-26 NOTE — ED Notes (Signed)
Pt has not urinated

## 2016-11-26 NOTE — ED Notes (Signed)
Pt not able to urinate.   

## 2016-11-26 NOTE — ED Triage Notes (Signed)
Pt reports bilateral testticular pain, was seen at PCP , sent to Ed fpr possible torsion.

## 2016-11-26 NOTE — ED Provider Notes (Signed)
The patient is asking for additional pain medication at 17: 50.  He also reported to the nurse that he has chronic pain.  He was prescribed hydrocodone with APAP, 10/325 #30, and filled the prescription on 11/19/16.  There are no other entries in the [place] controlled substances databank.  At this time he complains of 6/10 pain, radiating from the testicle to the left lower quadrant of his abdomen.  He has no history of urolithiasis.  He denies bladder infection symptoms.  No recent trauma.  CT scan ordered to evaluate for obstructing ureteral stone.    Patient Vitals for the past 24 hrs:  BP Temp Temp src Pulse Resp SpO2  11/26/16 1600 124/73 - - 88 17 93 %  11/26/16 1530 120/73 - - 84 - 93 %  11/26/16 1500 114/80 - - 87 - 94 %  11/26/16 1430 117/76 - - 84 - 94 %  11/26/16 1400 122/83 - - 82 - 94 %  11/26/16 1337 (!) 136/105 - - 88 17 93 %  11/26/16 1214 (!) 143/104 98.6 F (37 C) Oral (!) 101 18 97 %   Medications  HYDROmorphone (DILAUDID) injection 1 mg (1 mg Intravenous Given 11/26/16 1331)  ketorolac (TORADOL) 15 MG/ML injection 15 mg (15 mg Intravenous Given 11/26/16 1331)  ondansetron (ZOFRAN) injection 4 mg (4 mg Intravenous Given 11/26/16 1331)  sodium chloride 0.9 % bolus 1,000 mL (0 mLs Intravenous Stopped 11/26/16 1540)    7:15 PM Reevaluation with update and discussion. After initial assessment and treatment, an updated evaluation reveals patient is relatively comfortable at this time.  He states that he is currently taking an anti-inflammatory at home, and does not need additional medications.  Findings discussed with the patient and all  questions answered. Isidra Mings L   Medical decision-making-nonspecific left testicle, and left lower quadrant abdominal pain.  Evaluation is most consistent with nonspecific neuropathy, versus muscular skeletal pain.  No indication for further evaluation or treatment in the ED setting at this time.  Plan-apply heat to affected areas, and  continue usual medications.  Recommend follow-up with physician, neurologist, prescribing narcotic for chronic back pain.      Mancel BaleWentz, Prisha Hiley, MD 11/26/16 [id number]

## 2016-11-26 NOTE — ED Notes (Signed)
Patient requesting more pain medication. Made [facility] ED[facility] aware.  No new orders at this time.

## 2016-11-27 LAB — GC/CHLAMYDIA PROBE AMP (~~LOC~~) NOT AT ARMC
Chlamydia: NEGATIVE
Neisseria Gonorrhea: NEGATIVE

## 2016-12-09 DIAGNOSIS — M5417 Radiculopathy, lumbosacral region: Secondary | ICD-10-CM | POA: Diagnosis not present

## 2016-12-09 DIAGNOSIS — G4752 REM sleep behavior disorder: Secondary | ICD-10-CM | POA: Diagnosis not present

## 2016-12-09 DIAGNOSIS — M542 Cervicalgia: Secondary | ICD-10-CM | POA: Diagnosis not present

## 2016-12-12 ENCOUNTER — Encounter: Payer: Self-pay | Admitting: Adult Health

## 2016-12-14 NOTE — ED Provider Notes (Signed)
[facility]-EMERGENCY DEPT Provider Note   CSN: [id number] Arrival date & time: 11/26/16  1159     History   Chief Complaint Chief Complaint  Patient presents with  . Testicle Pain    HPI Shane Cole. is a 35 y.o. male.  HPI   36 year old male with testicular pain. Onset 2 days ago. Persistent since then. Bilaterally, but left worse than right. Seen by PCP and referred to the emergency room with concern for possible torsion. Denies any rash or other skin changes. No urinary complaints. Mild nausea.  No vomiting.  Past Medical History:  Diagnosis Date  . Allergy   . Anxiety   . Bipolar disorder (HCC)   . Bronchitis   . Crush injury lower leg    Left lower leg  . Depression   . Gastric ulcer   . GERD (gastroesophageal reflux disease)    will awaken him in night  . Migraine   . Nerve pain   . RSD (reflex sympathetic dystrophy)     Patient Active Problem List   Diagnosis Date Noted  . GERD (gastroesophageal reflux disease) 07/16/2016  . Internal hemorrhoid 07/16/2016  . Abdominal pain, chronic, epigastric 07/16/2016  . Rectal bleeding 07/16/2016  . Essential hypertension 02/26/2016  . Adhesive capsulitis of right shoulder 07/16/2015  . Thoracic outlet syndrome 02/12/2015  . Chronic narcotic use 10/26/2014  . Opioid contract exists 10/26/2014  . Cellulitis of buttock, left 09/30/2014  . RSD lower limb 06/30/2014  . Tobacco use disorder 05/11/2014  . Chronic diarrhea 03/29/2012  . Bipolar disorder (HCC) 03/29/2012    Past Surgical History:  Procedure Laterality Date  . CHOLECYSTECTOMY N/A 01/24/2014   Procedure: LAPAROSCOPIC CHOLECYSTECTOMY;  Surgeon: Axel Filler, MD;  Location: [facility] OR;  Service: General;  Laterality: N/A;  . COLONOSCOPY    . HAND SURGERY    . MASS EXCISION Left 01/25/2013   Procedure: EXCISION MASS DORSAL ASPECT LEFT LONG FINGER DISTAL INTERPHALANGEAL JOINT;  Surgeon: Wyn Forster., MD;  Location: [facility];  Service:  Orthopedics;  Laterality: Left;  Left long   . MOUTH SURGERY         Home Medications    Prior to Admission medications   Medication Sig Start Date End Date Taking? Authorizing Provider  Dexlansoprazole 30 MG capsule Take 1 capsule (30 mg total) by mouth daily. 04/02/16  Yes Nafziger, Kandee Keen, NP  DU[place]IS 800-26.6 MG TABS Take 1 tablet by mouth every 8 (eight) hours as needed (back pain.).  08/26/16  Yes [provider]  gabapentin (NEURONTIN) 300 MG capsule Take 1 capsule (300 mg total) by mouth 3 (three) times daily. 02/26/16  Yes Nafziger, Kandee Keen, NP  HYDROcodone-acetaminophen (NORCO) 10-325 MG tablet Take 1 tablet by mouth daily as needed for moderate pain.  11/19/16  Yes [provider]  lidocaine (XYLOCAINE) 5 % ointment Apply 1 application topically as needed. Patient taking differently: Apply 1 application topically daily as needed for mild pain.  02/26/16  Yes Nafziger, Kandee Keen, NP  Multiple Vitamin (MULTIVITAMIN WITH MINERALS) TABS tablet Take 1 tablet by mouth daily.   Yes [provider]  nortriptyline (PAMELOR) 25 MG capsule Take 1 capsule (25 mg total) by mouth at bedtime. Patient taking differently: Take 75 mg by mouth at bedtime.  02/26/16  Yes Nafziger, Kandee Keen, NP  ondansetron (ZOFRAN) 4 MG tablet Take 1 tablet (4 mg total) by mouth every 8 (eight) hours as needed for nausea or vomiting. 07/22/16  Yes Meredith Pel,  NP  ranitidine (ZANTAC) 150 MG tablet Take 1 tablet (150 mg total) by mouth at bedtime. Patient taking differently: Take 150 mg by mouth 3 (three) times daily as needed for heartburn.  12/30/13  Yes Le, Thao P, DO  sildenafil (REVATIO) 20 MG tablet take one tablet by mouth twice daily. 11/17/16  Yes [provider]  traMADol (ULTRAM) 50 MG tablet Take 50 mg by mouth every 4 (four) hours as needed for moderate pain.  10/24/16  Yes [provider]  sucralfate (CARAFATE) 1 g tablet Take 1 tablet (1 g total) by mouth 4 (four) times daily -   with meals and at bedtime. Patient not taking: Reported on 11/26/2016 07/16/16   Meredith Pel, NP    Family History Family History  Problem Relation Age of Onset  . Hyperlipidemia Father   . Hypertension Father   . Anxiety disorder Father   . Drug abuse Father   . Cancer Paternal Grandfather        lung, colon  . Colon cancer Paternal Grandfather   . Lung cancer Paternal Grandfather   . Diabetes Paternal Grandmother   . Cancer Paternal Grandmother   . Cancer Maternal Grandmother   . Cancer Maternal Grandfather   . Lung cancer Maternal Grandfather   . Stroke Unknown   . Heart disease Unknown   . Depression Paternal Aunt   . Anxiety disorder Paternal Aunt   . Drug abuse Paternal Uncle     Social History Social History  Substance Use Topics  . Smoking status: Current Every Day Smoker    Packs/day: 1.00    Years: 23.00    Types: Cigarettes, E-cigarettes  . Smokeless tobacco: Current User  . Alcohol use 3.6 oz/week    6 Cans of beer per week     Comment: occasional     Allergies   Aspirin; Codeine; Penicillins; and Amitriptyline   Review of Systems Review of Systems  All systems reviewed and negative, other than as noted in HPI.   Physical Exam Updated Vital Signs BP 117/68 (BP Location: Left Arm)   Pulse 68   Temp 98.3 F (36.8 C) (Oral)   Resp 12   SpO2 95%   Physical Exam  Constitutional: He appears well-developed and well-nourished. No distress.  HENT:  Head: Normocephalic and atraumatic.  Eyes: Conjunctivae are normal. Right eye exhibits no discharge. Left eye exhibits no discharge.  Neck: Neck supple.  Cardiovascular: Normal rate, regular rhythm and normal heart sounds.  Exam reveals no gallop and no friction rub.   No murmur heard. Pulmonary/Chest: Effort normal and breath sounds normal. No respiratory distress.  Abdominal: Soft. He exhibits no distension. There is no tenderness.  Genitourinary:  Genitourinary Comments: Normal genitalia. No  lesions. No hernia. No testicular tenderness. Normal lie.   Musculoskeletal: He exhibits no edema or tenderness.  Neurological: He is alert.  Skin: Skin is warm and dry.  Psychiatric: He has a normal mood and affect. His behavior is normal. Thought content normal.  Nursing note and vitals reviewed.    ED Treatments / Results  Labs (all labs ordered are listed, but only abnormal results are displayed) Labs Reviewed  RAPID URINE DRUG SCREEN, HOSP PERFORMED - Abnormal; Notable for the following:       Result Value   Opiates POSITIVE (*)    All other components within normal limits  URINALYSIS, ROUTINE W REFLEX MICROSCOPIC  GC/CHLAMYDIA PROBE AMP () NOT AT [facility]    EKG  EKG  Interpretation None       Radiology No results found.   US Scrotum  Result Date: 11/26/2016 CLINICAL DATA:  LEFT testicular pain for 2 days. Status post vasectomy. EXAM: SCROTAL ULTRASOUND DOPPLER ULTRASOUND OF THE TESTICLES TECHNIQUE: Complete ultrasound examination of the testicles, epididymis, and other scrotal structures was performed. Color and spectral Doppler ultrasound were also utilized to evaluate blood flow to the testicles. COMPARISON:  None. FINDINGS: Right testicle Measurements: 5.1 x 2.8 x 3.2 cm. No mass or microlithiasis visualized. Left testicle Measurements: 5 x 2.8 x 3.1 cm. No mass or microlithiasis visualized. Right epididymis:  Normal in size and appearance. Left epididymis:  Normal in size and appearance. Hydrocele:  None visualized. Varicocele:  None visualized. Pulsed Doppler interrogation of both testes demonstrates normal low resistance arterial and venous waveforms bilaterally. IMPRESSION: Normal scrotal ultrasound. Electronically Signed   By: Awilda Metro M.D.   On: 11/26/2016 13:34   Ct Renal Stone Study  Result Date: 11/26/2016 CLINICAL DATA:  Flank pain, stone disease suspected. Left testicular pain and swelling. EXAM: CT ABDOMEN AND PELVIS WITHOUT CONTRAST TECHNIQUE:  Multidetector CT imaging of the abdomen and pelvis was performed following the standard protocol without IV contrast. COMPARISON:  Abdominal CT 01/03/2014 FINDINGS: Lower chest: Mild dependent atelectasis. Diaphragmatic hiatus with herniation of fat and small lymph nodes, similar to prior exam. Hepatobiliary: The degree of hepatic steatosis has diminished from prior exam. Postcholecystectomy. Hyperdensity at the cystic duct remnant likely postsurgical, less likely cystic duct remnant stones. No biliary ductal dilatation. Pancreas: No ductal dilatation or inflammation. Spleen: Normal in size without focal abnormality. Adrenals/Urinary Tract: Normal adrenal glands. No hydronephrosis or perinephric edema. No urolithiasis. Both ureters are decompressed without stones along the course. Urinary bladder is physiologically distended without wall thickening or bladder stone. Stomach/Bowel: Stomach physiologically distended. No small bowel inflammation, obstruction or wall thickening. Normal appendix. Mild sigmoid colonic diverticulosis without diverticulitis. Small to moderate colonic stool burden. No colonic wall thickening or inflammation. Vascular/Lymphatic: Retroaortic left renal vein. Normal caliber abdominal aorta. Unchanged small retroperitoneal nodes, likely reactive. Reproductive: Prostate is unremarkable. Other: No free air, free fluid, or intra-abdominal fluid collection. Tiny fat containing umbilical hernia, unchanged. Musculoskeletal: There are no acute or suspicious osseous abnormalities. Stable left acetabular bone island. IMPRESSION: 1.  No renal stones or obstructive uropathy. 2. No acute abnormality in the abdomen/pelvis. 3. Colonic diverticulosis without acute inflammation. 4. Hepatic steatosis, improved in CT appearance from prior. Electronically Signed   By: Rubye Oaks M.D.   On: 11/26/2016 18:41   [place] Scrotom Doppler  Result Date: 11/26/2016 CLINICAL DATA:  LEFT testicular pain for 2 days.  Status post vasectomy. EXAM: SCROTAL ULTRASOUND DOPPLER ULTRASOUND OF THE TESTICLES TECHNIQUE: Complete ultrasound examination of the testicles, epididymis, and other scrotal structures was performed. Color and spectral Doppler ultrasound were also utilized to evaluate blood flow to the testicles. COMPARISON:  None. FINDINGS: Right testicle Measurements: 5.1 x 2.8 x 3.2 cm. No mass or microlithiasis visualized. Left testicle Measurements: 5 x 2.8 x 3.1 cm. No mass or microlithiasis visualized. Right epididymis:  Normal in size and appearance. Left epididymis:  Normal in size and appearance. Hydrocele:  None visualized. Varicocele:  None visualized. Pulsed Doppler interrogation of both testes demonstrates normal low resistance arterial and venous waveforms bilaterally. IMPRESSION: Normal scrotal ultrasound. Electronically Signed   By: Awilda Metro M.D.   On: 11/26/2016 13:34    Procedures Procedures (including critical care time)  Medications Ordered in ED Medications  HYDROmorphone (DILAUDID) injection  1 mg (1 mg Intravenous Given 11/26/16 1331)  ketorolac (TORADOL) 15 MG/ML injection 15 mg (15 mg Intravenous Given 11/26/16 1331)  ondansetron (ZOFRAN) injection 4 mg (4 mg Intravenous Given 11/26/16 1331)  sodium chloride 0.9 % bolus 1,000 mL (0 mLs Intravenous Stopped 11/26/16 1540)     Initial Impression / Assessment and Plan / ED Course  I have reviewed the triage vital signs and the nursing notes.  Pertinent labs & imaging results that were available during my care of the patient were reviewed by me and considered in my medical decision making (see chart for details).     36yM with L testicular pain. Exam unremarkable. Etiology? [place] normal. Referred pain from ureteral stone?  Final Clinical Impressions(s) / ED Diagnoses   Final diagnoses:  Testicular pain, left  Left testicular pain    New Prescriptions Discharge Medication List as of 11/26/2016  7:19 PM       Raeford Razor,  MD 12/14/16 1047

## 2016-12-22 ENCOUNTER — Other Ambulatory Visit: Payer: Self-pay | Admitting: Gastroenterology

## 2016-12-26 ENCOUNTER — Ambulatory Visit: Payer: 59 | Admitting: Adult Health

## 2017-01-09 ENCOUNTER — Other Ambulatory Visit: Payer: Self-pay | Admitting: Nurse Practitioner

## 2017-01-14 ENCOUNTER — Telehealth: Payer: Self-pay

## 2017-01-14 ENCOUNTER — Other Ambulatory Visit: Payer: Self-pay

## 2017-01-14 MED ORDER — ONDANSETRON 4 MG PO TBDP: 4.0000 mg | ORAL_TABLET | Freq: Three times a day (TID) | ORAL | 1 refills | Status: DC | PRN

## 2017-01-14 NOTE — Telephone Encounter (Signed)
Patient requesting a refill for Zofran 4mg  (wants ODT tablets)  I sent a new RX for ODT tablets #20 with 1 refill.

## 2017-01-14 NOTE — Telephone Encounter (Signed)
Thanks

## 2017-01-15 ENCOUNTER — Ambulatory Visit (INDEPENDENT_AMBULATORY_CARE_PROVIDER_SITE_OTHER)
Admission: RE | Admit: 2017-01-15 | Discharge: 2017-01-15 | Disposition: A | Discharge status: Home / Self Care | Payer: 59 | Source: Ambulatory Visit | Attending: Adult Health | Admitting: Adult Health

## 2017-01-15 ENCOUNTER — Encounter: Payer: Self-pay | Admitting: Adult Health

## 2017-01-15 ENCOUNTER — Ambulatory Visit (INDEPENDENT_AMBULATORY_CARE_PROVIDER_SITE_OTHER): Payer: 59 | Admitting: Adult Health

## 2017-01-15 VITALS — BP 132/86 | Temp 98.4°F | Wt 217.0 lb

## 2017-01-15 DIAGNOSIS — K2100 Gastro-esophageal reflux disease with esophagitis, without bleeding: Secondary | ICD-10-CM

## 2017-01-15 DIAGNOSIS — R112 Nausea with vomiting, unspecified: Secondary | ICD-10-CM | POA: Diagnosis not present

## 2017-01-15 DIAGNOSIS — R5383 Other fatigue: Secondary | ICD-10-CM | POA: Diagnosis not present

## 2017-01-15 DIAGNOSIS — R0602 Shortness of breath: Secondary | ICD-10-CM | POA: Diagnosis not present

## 2017-01-15 DIAGNOSIS — R05 Cough: Secondary | ICD-10-CM | POA: Diagnosis not present

## 2017-01-15 LAB — CBC WITH DIFFERENTIAL/PLATELET
BASOS PCT: 0.5 % (ref 0.0–3.0)
Basophils Absolute: 0 10*3/uL (ref 0.0–0.1)
EOS ABS: 0.1 10*3/uL (ref 0.0–0.7)
EOS PCT: 0.8 % (ref 0.0–5.0)
HEMATOCRIT: 43.3 % (ref 39.0–52.0)
HEMOGLOBIN: 14.5 g/dL (ref 13.0–17.0)
LYMPHS PCT: 18.6 % (ref 12.0–46.0)
Lymphs Abs: 1.4 10*3/uL (ref 0.7–4.0)
MCHC: 33.5 g/dL (ref 30.0–36.0)
MCV: 94.9 fl (ref 78.0–100.0)
Monocytes Absolute: 0.5 10*3/uL (ref 0.1–1.0)
Monocytes Relative: 6.7 % (ref 3.0–12.0)
NEUTROS ABS: 5.4 10*3/uL (ref 1.4–7.7)
Neutrophils Relative %: 73.4 % (ref 43.0–77.0)
PLATELETS: 280 10*3/uL (ref 150.0–400.0)
RBC: 4.56 Mil/uL (ref 4.22–5.81)
RDW: 13.5 % (ref 11.5–15.5)
WBC: 7.4 10*3/uL (ref 4.0–10.5)

## 2017-01-15 LAB — BASIC METABOLIC PANEL
BUN: 15 mg/dL (ref 6–23)
CHLORIDE: 108 meq/L (ref 96–112)
CO2: 27 meq/L (ref 19–32)
CREATININE: 0.88 mg/dL (ref 0.40–1.50)
Calcium: 9.3 mg/dL (ref 8.4–10.5)
GFR: 103.35 mL/min (ref >60.00)
Glucose, Bld: 104 mg/dL — ABNORMAL HIGH (ref 70–99)
POTASSIUM: 4.3 meq/L (ref 3.5–5.1)
Sodium: 143 mEq/L (ref 135–145)

## 2017-01-15 LAB — HEPATIC FUNCTION PANEL
ALBUMIN: 4.4 g/dL (ref 3.5–5.2)
ALT: 50 U/L (ref 0–53)
AST: 19 U/L (ref 0–37)
Alkaline Phosphatase: 76 U/L (ref 39–117)
BILIRUBIN DIRECT: 0.1 mg/dL (ref 0.0–0.3)
TOTAL PROTEIN: 6.5 g/dL (ref 6.0–8.3)
Total Bilirubin: 0.3 mg/dL (ref 0.2–1.2)

## 2017-01-15 LAB — C-REACTIVE PROTEIN: CRP: 0.4 mg/dL — AB (ref 0.5–20.0)

## 2017-01-15 IMAGING — DX DG CHEST 2V
2 series · 2 of 2 positions shown · non-contrast
Comparison: Chest x-ray of [DATE]

CLINICAL DATA: Cough, chest congestion, shortness of breath,
smoking history

EXAM:
CHEST  2 VIEW

[chest pa]
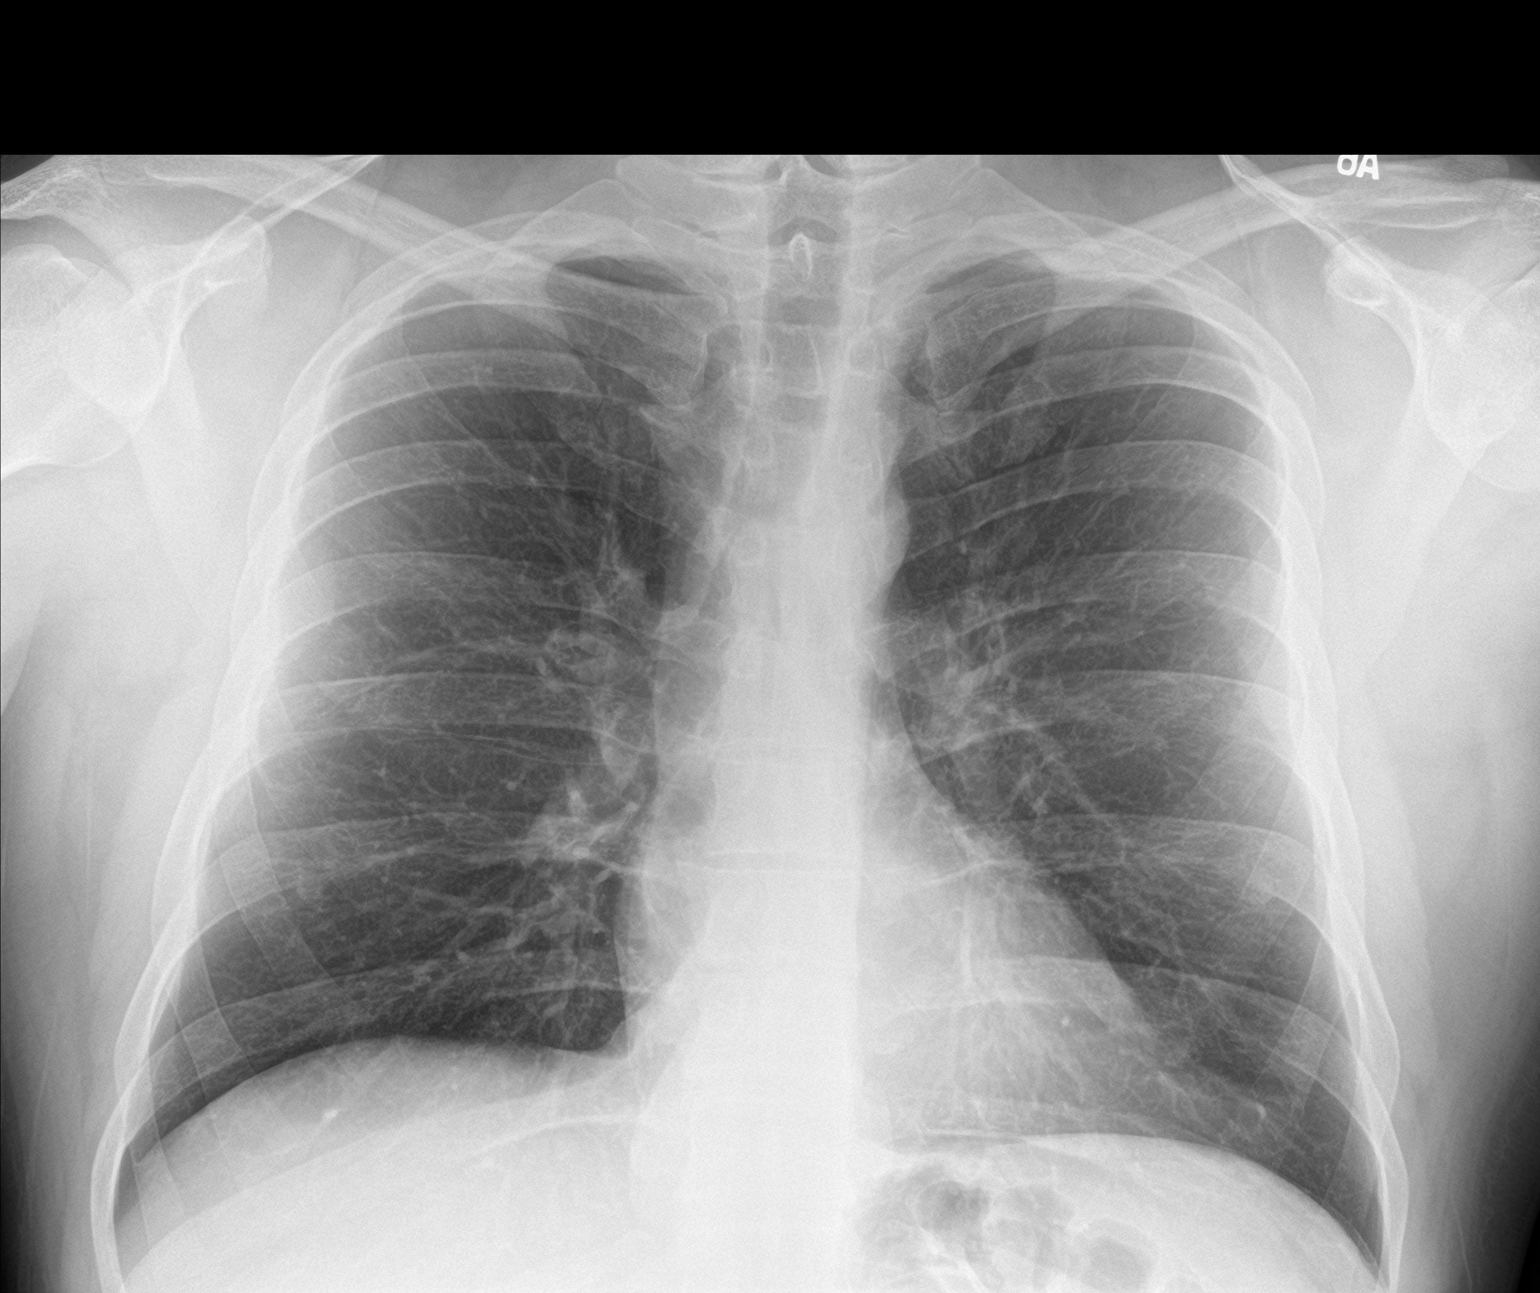

[chest lat]
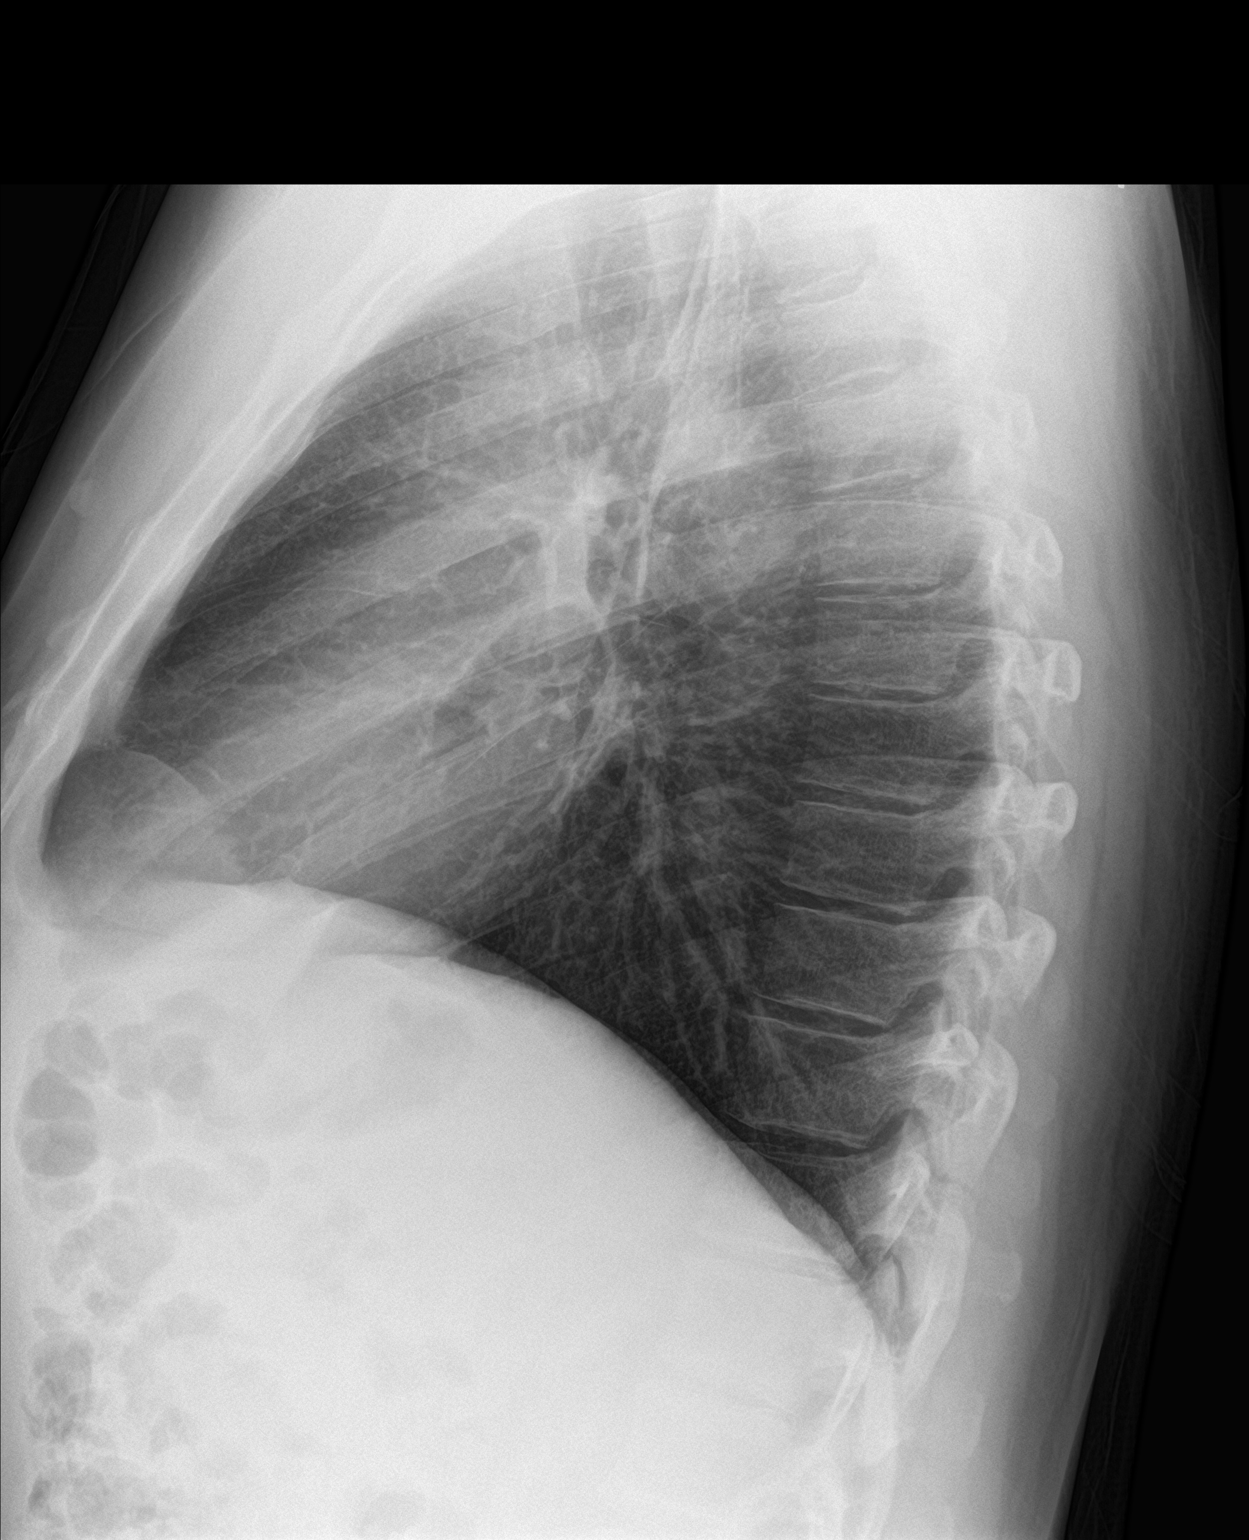

[2 of 2 positions shown; findings below may reference images not displayed]

FINDINGS: No active infiltrate or effusion is seen. Mediastinal and hilar
contours are unremarkable. The heart is within normal limits in
size. No bony abnormality is seen.
IMPRESSION: No active cardiopulmonary disease.

## 2017-01-15 MED ORDER — PREDNISONE 10 MG PO TABS: ORAL_TABLET | ORAL | 0 refills | Status: DC

## 2017-01-15 MED ORDER — DEXLANSOPRAZOLE 30 MG PO CPDR: 30.0000 mg | DELAYED_RELEASE_CAPSULE | Freq: Every day | ORAL | 3 refills | Status: DC

## 2017-01-15 MED ORDER — ONDANSETRON 4 MG PO TBDP: 4.0000 mg | ORAL_TABLET | Freq: Three times a day (TID) | ORAL | 0 refills | Status: DC | PRN

## 2017-01-15 NOTE — Progress Notes (Signed)
Subjective:    Patient ID: Shane Cole., male    DOB: 1980/04/14, 36 y.o.   MRN: [id number]  HPI  36 year old male who  has a past medical history of Allergy; Anxiety; Bipolar disorder (HCC); Bronchitis; Crush injury lower leg; Depression; Gastric ulcer; GERD (gastroesophageal reflux disease); Migraine; Nerve pain; and RSD (reflex sympathetic dystrophy). He reports to the office today for a multiple month complaint of " feeling like crap". His symptoms include that of generalized fatigue, loss of appetite, weight loss, not sleeping well ( he has trouble staying asleep. He takes a klonopin nightly), increased acid reflux ( despite using protonix and zantac), increased back pain, chest congestion, and nausea and vomiting. He feels as though his symptoms have not become worse but are staying consistent daily.   He denies fevers but does have night sweats.   Does get multiple tick bites over the summer but states, " I just pick them off as soon as I see them."   He has not noticed any rash.   Wt Readings from Last 3 Encounters:  01/15/17 217 lb (98.4 kg)  11/26/16 212 lb (96.2 kg)  07/16/16 222 lb (100.7 kg)   Review of Systems  Constitutional: Positive for activity change, appetite change, diaphoresis, fatigue and unexpected weight change. Negative for chills and fever.  HENT: Negative.   Eyes: Negative.   Respiratory: Positive for cough (non productive ), chest tightness and shortness of breath. Negative for apnea, wheezing and stridor.   Cardiovascular: Negative.   Gastrointestinal: Positive for abdominal pain, nausea and vomiting.  Endocrine: Negative.   Genitourinary: Negative.   Musculoskeletal: Positive for arthralgias and back pain. Negative for joint swelling, neck pain and neck stiffness.  Skin: Negative.   Allergic/Immunologic: Negative.   Neurological: Negative.   Hematological: Negative.   Psychiatric/Behavioral: Positive for sleep disturbance.  All other systems  reviewed and are negative.  Past Medical History:  Diagnosis Date  . Allergy   . Anxiety   . Bipolar disorder (HCC)   . Bronchitis   . Crush injury lower leg    Left lower leg  . Depression   . Gastric ulcer   . GERD (gastroesophageal reflux disease)    will awaken him in night  . Migraine   . Nerve pain   . RSD (reflex sympathetic dystrophy)     Social History   Social History  . Marital status: Married    Spouse name: N/A  . Number of children: 1  . Years of education: 10th grade   Occupational History  . Disabled     Previously worked Web designer   Social History Main Topics  . Smoking status: Current Every Day Smoker    Packs/day: 1.00    Years: 23.00    Types: Cigarettes, E-cigarettes  . Smokeless tobacco: Current User  . Alcohol use 3.6 oz/week    6 Cans of beer per week     Comment: occasional  . Drug use: No  . Sexual activity: Yes    Partners: Female   Other Topics Concern  . Not on file   Social History Narrative   05/24/12  Antonin was born and grew up in [place], [place]. He has 2 sisters. He reports that his childhood was "lousy." He completed the 10th grade. He has been married for 5 years, and is currently separated for 3 weeks. He has a daughter who is 66-1/2 years old. He has been unemployed for  2 years, and is disabled due to an on-the-job work accident. He is currently living with his aunt and cousin. He reports that his hobbies are sports and dogs. He reports that he is spiritual, but not religious. He states that his aunt and his father are his social support system. He denies any current legal problems, but got a DUI in 2007. 05/24/12 [facility]          Past Surgical History:  Procedure Laterality Date  . CHOLECYSTECTOMY N/A 01/24/2014   Procedure: LAPAROSCOPIC CHOLECYSTECTOMY;  Surgeon: Axel Filler, MD;  Location: [facility];  Service: General;  Laterality: N/A;  . COLONOSCOPY    . HAND SURGERY    . MASS EXCISION Left  01/25/2013   Procedure: EXCISION MASS DORSAL ASPECT LEFT LONG FINGER DISTAL INTERPHALANGEAL JOINT;  Surgeon: Wyn Forster., MD;  Location: [facility];  Service: Orthopedics;  Laterality: Left;  Left long   . MOUTH SURGERY      Family History  Problem Relation Age of Onset  . Hyperlipidemia Father   . Hypertension Father   . Anxiety disorder Father   . Drug abuse Father   . Cancer Paternal Grandfather        lung, colon  . Colon cancer Paternal Grandfather   . Lung cancer Paternal Grandfather   . Diabetes Paternal Grandmother   . Cancer Paternal Grandmother   . Cancer Maternal Grandmother   . Cancer Maternal Grandfather   . Lung cancer Maternal Grandfather   . Stroke Unknown   . Heart disease Unknown   . Depression Paternal Aunt   . Anxiety disorder Paternal Aunt   . Drug abuse Paternal Uncle     Allergies  Allergen Reactions  . Aspirin Other (See Comments)    Tinnitus, dizzy, short of breath  . Codeine Itching    Reaction to tylenol #3  . Penicillins Anaphylaxis    Was told never to take medication.  Has patient had a PCN reaction causing immediate rash, facial/tongue/throat swelling, SOB or lightheadedness with hypotension: no Has patient had a PCN reaction causing severe rash involving mucus membranes or skin necrosis: no Has patient had a PCN reaction that required hospitalization: no Has patient had a PCN reaction occurring within the last 10 years: no If all of the above answers are "NO", then may proceed with Cephalosporin use.   . Amitriptyline Rash    Current Outpatient Prescriptions on File Prior to Visit  Medication Sig Dispense Refill  . Dexlansoprazole 30 MG capsule Take 1 capsule (30 mg total) by mouth daily. 30 capsule 3  . DUEXIS 800-26.6 MG TABS Take 1 tablet by mouth every 8 (eight) hours as needed (back pain.).   6  . gabapentin (NEURONTIN) 300 MG capsule Take 1 capsule (300 mg total) by mouth 3 (three) times daily. 270 capsule 3    . Multiple Vitamin (MULTIVITAMIN WITH MINERALS) TABS tablet Take 1 tablet by mouth daily.    . nortriptyline (PAMELOR) 25 MG capsule Take 1 capsule (25 mg total) by mouth at bedtime. (Patient taking differently: Take 75 mg by mouth at bedtime. ) 30 capsule 1  . ranitidine (ZANTAC) 150 MG tablet Take 1 tablet (150 mg total) by mouth at bedtime. (Patient taking differently: Take 150 mg by mouth 3 (three) times daily as needed for heartburn. ) 30 tablet 2  . sildenafil (REVATIO) 20 MG tablet take one tablet by mouth twice daily.  5  . sucralfate (CARAFATE) 1 g tablet Take 1  tablet (1 g total) by mouth 4 (four) times daily -  with meals and at bedtime. 120 tablet 1  . traMADol (ULTRAM) 50 MG tablet Take 50 mg by mouth every 4 (four) hours as needed for moderate pain.   3  . ondansetron (ZOFRAN ODT) 4 MG disintegrating tablet Take 1 tablet (4 mg total) by mouth every 8 (eight) hours as needed for nausea or vomiting. (Patient not taking: Reported on 01/15/2017) 20 tablet 1   No current facility-administered medications on file prior to visit.     BP 132/86 (BP Location: Left Arm)   Temp 98.4 F (36.9 C) (Oral)   Wt 217 lb (98.4 kg)   BMI 30.27 kg/m       Objective:   Physical Exam  Constitutional: He is oriented to person, place, and time. He appears well-developed and well-nourished. No distress.  HENT:  Nose: Nose normal. Right sinus exhibits no maxillary sinus tenderness and no frontal sinus tenderness. Left sinus exhibits no maxillary sinus tenderness and no frontal sinus tenderness.  Mouth/Throat: Oropharynx is clear and moist and mucous membranes are normal. Abnormal dentition.  Eyes: Pupils are equal, round, and reactive to light. Conjunctivae and EOM are normal. Right eye exhibits no discharge. Left eye exhibits no discharge.  Cardiovascular: Normal rate, regular rhythm, normal heart sounds and intact distal pulses.  Exam reveals no gallop and no friction rub.   No murmur  heard. Pulmonary/Chest: Effort normal and breath sounds normal. No respiratory distress. He has no wheezes. He has no rales. He exhibits no tenderness.  Abdominal: Soft. Normal appearance and bowel sounds are normal. He exhibits no distension and no mass. There is no hepatosplenomegaly, splenomegaly or hepatomegaly. There is tenderness in the right upper quadrant, right lower quadrant, epigastric area, periumbilical area and suprapubic area. There is positive Murphy's sign. There is no rigidity, no rebound, no guarding, no CVA tenderness and no tenderness at McBurney's point. No hernia.  Neurological: He is alert and oriented to person, place, and time. He has normal reflexes.  Skin: Skin is warm and dry. No rash noted. He is not diaphoretic. No erythema. No pallor.  Psychiatric: He has a normal mood and affect. His behavior is normal. Judgment and thought content normal.  Nursing note and vitals reviewed.     Assessment & Plan:  1. Fatigue, unspecified type - unknown cause at this point. Need to r/o TB or tick borne illness due to symptoms. Possible PUD. Will prescribe dexilant but feel as though GI would be helpful due to his symptoms. He has seen Dr. Christella HartiganJacobs in the past. Consider endoscopy in the future. Will also consider CT of abdomen in the future.  - DG Chest 2 View; Future - Basic metabolic panel - CBC with Differential/Platelet - Hepatic function panel - POCT Urinalysis Dipstick (Automated) - C-reactive Protein - ANA - B. Burgdorfi Antibodies  2. Nausea and vomiting, intractability of vomiting not specified, unspecified vomiting type *- DG Chest 2 View; Future - Basic metabolic panel - CBC with Differential/Platelet - Hepatic function panel - POCT Urinalysis Dipstick (Automated) - C-reactive Protein - ANA - ondansetron (ZOFRAN ODT) 4 MG disintegrating tablet; Take 1 tablet (4 mg total) by mouth every 8 (eight) hours as needed for nausea or vomiting.  Dispense: 20 tablet; Refill:  0 - B. Burgdorfi Antibodies  3. Gastroesophageal reflux disease with esophagitis  - Dexlansoprazole 30 MG capsule; Take 1 capsule (30 mg total) by mouth daily.  Dispense: 30 capsule; Refill: 3  Dorothyann Peng, NP

## 2017-01-16 LAB — B. BURGDORFI ANTIBODIES

## 2017-01-16 LAB — ANA: ANA: NEGATIVE

## 2017-01-20 ENCOUNTER — Telehealth: Payer: Self-pay | Admitting: Adult Health

## 2017-01-20 NOTE — Telephone Encounter (Signed)
Spoke to patient and informed him of his labs, which were normal. I would like him to follow up with Dr. Vista LawmanJacobs   Tyliek Timberman, NP

## 2017-02-17 ENCOUNTER — Encounter: Payer: Self-pay | Admitting: Physician Assistant

## 2017-02-17 ENCOUNTER — Ambulatory Visit (INDEPENDENT_AMBULATORY_CARE_PROVIDER_SITE_OTHER): Payer: 59 | Admitting: Physician Assistant

## 2017-02-17 VITALS — BP 130/88 | HR 78 | Ht 71.0 in | Wt 220.0 lb

## 2017-02-17 DIAGNOSIS — K219 Gastro-esophageal reflux disease without esophagitis: Secondary | ICD-10-CM | POA: Diagnosis not present

## 2017-02-17 DIAGNOSIS — K625 Hemorrhage of anus and rectum: Secondary | ICD-10-CM

## 2017-02-17 DIAGNOSIS — R194 Change in bowel habit: Secondary | ICD-10-CM

## 2017-02-17 DIAGNOSIS — R1310 Dysphagia, unspecified: Secondary | ICD-10-CM | POA: Diagnosis not present

## 2017-02-17 DIAGNOSIS — R109 Unspecified abdominal pain: Secondary | ICD-10-CM | POA: Diagnosis not present

## 2017-02-17 DIAGNOSIS — R110 Nausea: Secondary | ICD-10-CM

## 2017-02-17 MED ORDER — ONDANSETRON 4 MG PO TBDP: 4.0000 mg | ORAL_TABLET | Freq: Three times a day (TID) | ORAL | 1 refills | Status: DC | PRN

## 2017-02-17 MED ORDER — NA SULFATE-K SULFATE-MG SULF 17.5-3.13-1.6 GM/177ML PO SOLN: 1.0000 | ORAL | 0 refills | Status: DC

## 2017-02-17 NOTE — Patient Instructions (Signed)

## 2017-02-17 NOTE — Progress Notes (Signed)
Chief Complaint: Right-sided abdominal pain  HPI:    Shane Cole is a 36 year old male with a past medical history of anxiety, bipolar disorder and multiple others listed below, who was referred to me by Shirline FreesNafziger, Cory, NP for a complaint of right-sided abdominal pain.      Patient was last seen in clinic 07/16/16 by Willette ClusterPaula Guenther, he is known to Dr. Christella HartiganJacobs and has a history of reflux and chronic diarrhea.  Patient had an EGD November 2015 with findings of small mild erosive esophagitis and a colonoscopy for eval of diarrhea in November 2015 which was normal except for a few diverticula.  At time of last visit patient had multiple medical complaints including loose stools for years and recently blood and mucus in his stool.  He described taking ibuprofen.  He had chronic right periumbilical pain not related to eating or bowel movements.  He described this occurred randomly and sometimes even woke him up at night.  It was described as "hot/stabbing and very transient", lasting no more than 30 seconds or so.  Patient apparently had a CT scan in 2012 and 2015 for the same pain with no acute findings.  He also described severe heartburn.  He was taking Dexilant and Zantac.  Recommendations at that time were for Anusol suppositories due to the finding of internal hemorrhoids.  It was discussed that his chronic right sided periumbilical pain may be musculoskeletal or neuropathic.  He was already taking gabapentin, nortriptyline as well as ibuprofen.  Reflux was discussed with frequent pyrosis on twice daily PPI plus Zantac 2-3 times a day.  He was given a trial of Carafate and it was thought but could consider pH study in the future.    Today, the patient presents to clinic and explains that he continues with his right-sided abdominal pain.  Patient continues to tell me this is constant.  If he "pushes in this area", he will have a dull pain.  Patient also continues to describe a "hot knife/poker" pain that  will come on intermittently every few months and will bend him over.  This only lasts for a few seconds at a time.  Patient does tell me he has been experiencing this pain ever since gallbladder surgery a few years ago.    Today the patient also describes that his reflux is under better control on Dexilant 30 mg once daily and 75 mg of Zantac once every few days.  Patient tells me though that he continues with a feeling as though there is something constantly in his throat and tells me that sometimes it is "hard to swallow".    Patient also describes nausea which is "almost constant".  He is on Zofran 4mg  prn for this.  He tells me "I try to avoid vomiting at all costs".    Patient also continues to describe bright red blood on his stool and on the toilet paper when wiping he reminds me of a finding of hemorrhoids during his last visit with us.  He was on a suppository twice a day for a week and tells me that the bleeding never stopped and he has continued with intermittent bleeding ever since then.  Patient is worried in regards to this due to his family history of colon cancer in his grandmother, grandfather and aunt.    Patient also describes variance in his bowel habits today between constipation and diarrhea.  He tells me he has been "trying to eat a healthier diet".  He  tells me he has gained "more muscle and has been losing fat".  Overall he feels healthier but tells me that his bowel habits are somewhat unpredictable.    Patient denies fever, chills, melena or symptoms that awaken him at night.    Past Medical History:  Diagnosis Date  . Allergy   . Anxiety   . Bipolar disorder (HCC)   . Bronchitis   . Crush injury lower leg    Left lower leg  . Depression   . Gastric ulcer   . GERD (gastroesophageal reflux disease)    will awaken him in night  . Migraine   . Nerve pain   . RSD (reflex sympathetic dystrophy)     Past Surgical History:  Procedure Laterality Date  . CHOLECYSTECTOMY  N/A 01/24/2014   Procedure: LAPAROSCOPIC CHOLECYSTECTOMY;  Surgeon: Axel Filler, MD;  Location: [facility] OR;  Service: General;  Laterality: N/A;  . COLONOSCOPY    . HAND SURGERY    . MASS EXCISION Left 01/25/2013   Procedure: EXCISION MASS DORSAL ASPECT LEFT LONG FINGER DISTAL INTERPHALANGEAL JOINT;  Surgeon: Wyn Forster., MD;  Location: [facility];  Service: Orthopedics;  Laterality: Left;  Left long   . MOUTH SURGERY      Current Outpatient Medications  Medication Sig Dispense Refill  . clonazePAM (KLONOPIN) 1 MG tablet Take 1 tablet by mouth at bedtime.  2  . Dexlansoprazole 30 MG capsule Take 1 capsule (30 mg total) by mouth daily. 30 capsule 3  . DUEXIS 800-26.6 MG TABS Take 1 tablet by mouth every 8 (eight) hours as needed (back pain.).   6  . gabapentin (NEURONTIN) 300 MG capsule Take 1 capsule (300 mg total) by mouth 3 (three) times daily. 270 capsule 3  . lidocaine (LINDAMANTLE) 3 % CREA cream   3  . Multiple Vitamin (MULTIVITAMIN WITH MINERALS) TABS tablet Take 1 tablet by mouth daily.    . nortriptyline (PAMELOR) 25 MG capsule Take 1 capsule (25 mg total) by mouth at bedtime. (Patient taking differently: Take 75 mg by mouth at bedtime. ) 30 capsule 1  . ondansetron (ZOFRAN ODT) 4 MG disintegrating tablet Take 1 tablet (4 mg total) by mouth every 8 (eight) hours as needed for nausea or vomiting. 20 tablet 1  . ondansetron (ZOFRAN ODT) 4 MG disintegrating tablet Take 1 tablet (4 mg total) by mouth every 8 (eight) hours as needed for nausea or vomiting. 20 tablet 0  . ondansetron (ZOFRAN) 4 MG tablet TAKE 1 TABLET (4 MG TOTAL) BY MOUTH EVERY 8 (EIGHT) HOURS AS NEEDED FOR NAUSEA OR VOMITING. 20 tablet 1  . oxyCODONE (OXY IR/ROXICODONE) 5 MG immediate release tablet Take 1 tablet by mouth 2 (two) times daily.  0  . predniSONE (DELTASONE) 10 MG tablet 40 mg x 3 days, 20 mg x 3 days, 10 mg x 3 days 21 tablet 0  . ranitidine (ZANTAC) 150 MG tablet Take 1 tablet (150 mg  total) by mouth at bedtime. (Patient taking differently: Take 150 mg by mouth 3 (three) times daily as needed for heartburn. ) 30 tablet 2  . sildenafil (REVATIO) 20 MG tablet take one tablet by mouth twice daily.  5  . sucralfate (CARAFATE) 1 g tablet Take 1 tablet (1 g total) by mouth 4 (four) times daily -  with meals and at bedtime. 120 tablet 1  . traMADol (ULTRAM) 50 MG tablet Take 50 mg by mouth every 4 (four) hours as needed for moderate  pain.   3   No current facility-administered medications for this visit.     Allergies as of 02/17/2017 - Review Complete 02/17/2017  Allergen Reaction Noted  . Aspirin Other (See Comments) 06/05/2011  . Codeine Itching 06/05/2011  . Penicillins Anaphylaxis 06/05/2011  . Amitriptyline Rash 01/25/2013    Family History  Problem Relation Age of Onset  . Hyperlipidemia Father   . Hypertension Father   . Anxiety disorder Father   . Drug abuse Father   . Cancer Paternal Grandfather        lung, colon  . Colon cancer Paternal Grandfather   . Lung cancer Paternal Grandfather   . Diabetes Paternal Grandmother   . Cancer Paternal Grandmother   . Cancer Maternal Grandmother   . Cancer Maternal Grandfather   . Lung cancer Maternal Grandfather   . Stroke Unknown   . Heart disease Unknown   . Depression Paternal Aunt   . Anxiety disorder Paternal Aunt   . Drug abuse Paternal Uncle     Social History   Socioeconomic History  . Marital status: Married    Spouse name: Not on file  . Number of children: 1  . Years of education: 10th grade  . Highest education level: Not on file  Social Needs  . Financial resource strain: Not on file  . Food insecurity - worry: Not on file  . Food insecurity - inability: Not on file  . Transportation needs - medical: Not on file  . Transportation needs - non-medical: Not on file  Occupational History  . Occupation: Disabled    Comment: Previously worked Web designer  Tobacco Use  .  Smoking status: Current Every Day Smoker    Packs/day: 1.00    Years: 23.00    Pack years: 23.00    Types: Cigarettes, E-cigarettes  . Smokeless tobacco: Current User  Substance and Sexual Activity  . Alcohol use: Yes    Alcohol/week: 3.6 oz    Types: 6 Cans of beer per week    Comment: occasional  . Drug use: No  . Sexual activity: Yes    Partners: Female  Other Topics Concern  . Not on file  Social History Narrative   05/24/12  Quintavius was born and grew up in [place], [place]. He has 2 sisters. He reports that his childhood was "lousy." He completed the 10th grade. He has been married for 5 years, and is currently separated for 3 weeks. He has a daughter who is 52-1/2 years old. He has been unemployed for 2 years, and is disabled due to an on-the-job work accident. He is currently living with his aunt and cousin. He reports that his hobbies are sports and dogs. He reports that he is spiritual, but not religious. He states that his aunt and his father are his social support system. He denies any current legal problems, but got a DUI in 2007. 05/24/12 [facility]          Review of Systems:    Constitutional: No weight loss, fever or chills Skin: No rash Cardiovascular: No chest pain Respiratory: No SOB Gastrointestinal: See HPI and otherwise negative   Physical Exam:  Vital signs: BP 130/88   Pulse 78   Ht 5\' 11"  (1.803 m)   Wt 220 lb (99.8 kg)   BMI 30.68 kg/m    Constitutional: Somewhat anxious Caucasian male appears to be in NAD, Well developed, Well nourished, alert and cooperative Head:  Normocephalic and atraumatic. Eyes:  PEERL, EOMI. No icterus. Conjunctiva pink. Ears:  Normal auditory acuity. Neck:  Supple Throat: Oral cavity and pharynx without inflammation, swelling or lesion.  Respiratory: Respirations even and unlabored. Lungs clear to auscultation bilaterally.   No wheezes, crackles, or rhonchi.  Cardiovascular: Normal S1, S2. No MRG. Regular rate and rhythm. No  peripheral edema, cyanosis or pallor.  Gastrointestinal:  Soft, nondistended, Right sided periumbilical ttp, No rebound or guarding. Normal bowel sounds. No appreciable masses or hepatomegaly. Rectal:  Not performed.  Msk:  Symmetrical without gross deformities. Without edema, no deformity or joint abnormality.  Neurologic:  Alert and  oriented x4;  grossly normal neurologically.  Skin:   Dry and intact without significant lesions or rashes. Psychiatric: Demonstrates good judgement and reason without abnormal affect or behaviors.  RELEVANT LABS AND IMAGING: CBC    Component Value Date/Time   WBC 7.4 01/15/2017 1100   RBC 4.56 01/15/2017 1100   HGB 14.5 01/15/2017 1100   HCT 43.3 01/15/2017 1100   PLT 280.0 01/15/2017 1100   MCV 94.9 01/15/2017 1100   MCV 89.5 10/26/2014 1827   MCH 29.2 10/26/2014 1827   MCH 31.1 01/20/2014 0829   MCHC 33.5 01/15/2017 1100   RDW 13.5 01/15/2017 1100   LYMPHSABS 1.4 01/15/2017 1100   MONOABS 0.5 01/15/2017 1100   EOSABS 0.1 01/15/2017 1100   BASOSABS 0.0 01/15/2017 1100    CMP     Component Value Date/Time   NA 143 01/15/2017 1100   K 4.3 01/15/2017 1100   CL 108 01/15/2017 1100   CO2 27 01/15/2017 1100   GLUCOSE 104 (H) 01/15/2017 1100   BUN 15 01/15/2017 1100   CREATININE 0.88 01/15/2017 1100   CREATININE 0.95 10/25/2014 2032   CALCIUM 9.3 01/15/2017 1100   PROT 6.5 01/15/2017 1100   ALBUMIN 4.4 01/15/2017 1100   AST 19 01/15/2017 1100   ALT 50 01/15/2017 1100   ALKPHOS 76 01/15/2017 1100   BILITOT 0.3 01/15/2017 1100   GFRNONAA >90 01/20/2014 0829   GFRAA >90 01/20/2014 0829    Assessment: 1.  Rectal bleeding: Continues, irregardless of Anusol suppositories used in the past, patient is concerned regarding family history of colon cancer 2.  Variance in bowel habits: Likely related to multiple medications and IBS 3.  Right-sided abdominal pain: Continues, this is in the area of previous laparoscopic cholecystectomy scars, question  abdominal wall pain, patient is currently on gabapentin as well as pain medicine 4.  GERD: Patient reports better control on Dexilant 30 mg once daily and Zantac 75 mg as needed since patient has changed his diet 5.  Dysphagia: Patient describes that it is "hard to swallow" at times, question true dysphagia versus globus sensation 6.  Nausea: Consider relation to gastritis versus H. pylori versus functional dyspepsia  Plan: 1.  Patient is requesting an EGD and colonoscopy today.  He tells me that his primary care provider thought this would be a good idea for further workup of his symptoms.  Discussed that due to his complaint of dysphagia and continued rectal bleeding irregardless of suppositories in the past, we can proceed with these today. 2.  Scheduled patient for an EGD and colonoscopy in the [facility] with Dr. Christella Hartigan.  Did discuss risks, benefits, limitations and alternatives and the patient agrees to proceed. 3.  Discussed a high-fiber diet as well as increased water intake 4.  Patient may continue his Dexilant 30 mg daily and Zantac 75 mg as needed 5.  Reviewed anti-dysphagia measures 6.  Patient may continue Zofran 4mg  prn, refilled 7. Patient will follow in clinic per recommendations from Dr. Christella HartiganJacobs after procedures  Hyacinth MeekerJennifer Sephora Boyar, PA-C [facility] 02/17/2017, 9:47 AM  Cc: Shirline FreesNafziger, Cory, NP

## 2017-02-18 NOTE — Progress Notes (Signed)
I agree with the above note, plan 

## 2017-03-02 ENCOUNTER — Encounter: Payer: Self-pay | Admitting: Gastroenterology

## 2017-03-11 DIAGNOSIS — G5602 Carpal tunnel syndrome, left upper limb: Secondary | ICD-10-CM | POA: Diagnosis not present

## 2017-03-11 DIAGNOSIS — Z79899 Other long term (current) drug therapy: Secondary | ICD-10-CM | POA: Diagnosis not present

## 2017-03-11 DIAGNOSIS — G4752 REM sleep behavior disorder: Secondary | ICD-10-CM | POA: Diagnosis not present

## 2017-03-11 DIAGNOSIS — M542 Cervicalgia: Secondary | ICD-10-CM | POA: Diagnosis not present

## 2017-03-11 DIAGNOSIS — M5417 Radiculopathy, lumbosacral region: Secondary | ICD-10-CM | POA: Diagnosis not present

## 2017-03-16 ENCOUNTER — Ambulatory Visit (AMBULATORY_SURGERY_CENTER): Payer: 59 | Admitting: Gastroenterology

## 2017-03-16 ENCOUNTER — Encounter: Payer: Self-pay | Admitting: Gastroenterology

## 2017-03-16 ENCOUNTER — Other Ambulatory Visit: Payer: Self-pay

## 2017-03-16 VITALS — BP 130/79 | HR 80 | Temp 98.9°F | Resp 15 | Ht 71.0 in | Wt 220.0 lb

## 2017-03-16 DIAGNOSIS — K625 Hemorrhage of anus and rectum: Secondary | ICD-10-CM

## 2017-03-16 DIAGNOSIS — G894 Chronic pain syndrome: Secondary | ICD-10-CM | POA: Diagnosis not present

## 2017-03-16 DIAGNOSIS — K219 Gastro-esophageal reflux disease without esophagitis: Secondary | ICD-10-CM | POA: Diagnosis not present

## 2017-03-16 DIAGNOSIS — K2950 Unspecified chronic gastritis without bleeding: Secondary | ICD-10-CM | POA: Diagnosis not present

## 2017-03-16 DIAGNOSIS — K2100 Gastro-esophageal reflux disease with esophagitis, without bleeding: Secondary | ICD-10-CM

## 2017-03-16 DIAGNOSIS — F319 Bipolar disorder, unspecified: Secondary | ICD-10-CM | POA: Diagnosis not present

## 2017-03-16 DIAGNOSIS — K2990 Gastroduodenitis, unspecified, without bleeding: Secondary | ICD-10-CM | POA: Diagnosis not present

## 2017-03-16 DIAGNOSIS — K2970 Gastritis, unspecified, without bleeding: Secondary | ICD-10-CM

## 2017-03-16 DIAGNOSIS — K449 Diaphragmatic hernia without obstruction or gangrene: Secondary | ICD-10-CM | POA: Diagnosis not present

## 2017-03-16 DIAGNOSIS — R635 Abnormal weight gain: Secondary | ICD-10-CM | POA: Diagnosis not present

## 2017-03-16 DIAGNOSIS — R1310 Dysphagia, unspecified: Secondary | ICD-10-CM | POA: Diagnosis not present

## 2017-03-16 MED ORDER — SODIUM CHLORIDE 0.9 % IV SOLN: 500.0000 mL | INTRAVENOUS | Status: DC

## 2017-03-16 NOTE — Op Note (Signed)
[facility] Patient Name: Shane Cole Procedure Date: 03/16/2017 10:22 AM MRN: [id number] Endoscopist: Rachael Feeaniel P Keysha Damewood , MD Age: 36 Referring MD:  Date of Birth: 02/29/1981 Gender: Male Account #: [id number] Procedure:                Colonoscopy Indications:              Rectal bleeding (not anemic), chronic RLQ pain Medicines:                Monitored Anesthesia Care Procedure:                Pre-Anesthesia Assessment:                           - Prior to the procedure, a History and Physical                            was performed, and patient medications and                            allergies were reviewed. The patient's tolerance of                            previous anesthesia was also reviewed. The risks                            and benefits of the procedure and the sedation                            options and risks were discussed with the patient.                            All questions were answered, and informed consent                            was obtained. Prior Anticoagulants: The patient has                            taken no previous anticoagulant or antiplatelet                            agents. ASA Grade Assessment: II - A patient with                            mild systemic disease. After reviewing the risks                            and benefits, the patient was deemed in                            satisfactory condition to undergo the procedure.                           After obtaining informed consent, the colonoscope  was passed under direct vision. Throughout the                            procedure, the patient's blood pressure, pulse, and                            oxygen saturations were monitored continuously. The                            Model CF-HQ190L [id number](SN#[id number]) scope was introduced                            through the anus and advanced to the the terminal                            ileum. The  colonoscopy was performed without                            difficulty. The patient tolerated the procedure                            well. The quality of the bowel preparation was                            excellent. The terminal ileum, ileocecal valve,                            appendiceal orifice, and rectum were photographed. Scope In: 10:35:01 AM Scope Out: 10:44:22 AM Scope Withdrawal Time: 0 hours 7 minutes 0 seconds  Total Procedure Duration: 0 hours 9 minutes 21 seconds  Findings:                 The terminal ileum appeared normal.                           The entire examined colon appeared normal on direct                            and retroflexion views. Complications:            No immediate complications. Estimated blood loss:                            None. Estimated Blood Loss:     Estimated blood loss: none. Impression:               - The examined portion of the ileum was normal.                           - The entire examined colon is normal on direct and                            retroflexion views.                           - The rectal  bleeding that you see is probably from                            minor, intermittent hemorrhoids. Recommendation:           - Patient has a contact number available for                            emergencies. The signs and symptoms of potential                            delayed complications were discussed with the                            patient. Return to normal activities tomorrow.                            Written discharge instructions were provided to the                            patient.                           - Resume previous diet.                           - Continue present medications.                           - Repeat colonoscopy at age 40 for screening                            purposes. Rachael Fee, MD 03/16/2017 10:52:29 AM This report has been signed electronically.

## 2017-03-16 NOTE — Progress Notes (Signed)
IV found to be not working in procedure room  IV dc'd and restarted in L ac

## 2017-03-16 NOTE — Patient Instructions (Signed)
YOU HAD AN ENDOSCOPIC PROCEDURE TODAY AT THE [facility]:   Refer to the procedure report that was given to you for any specific questions about what was found during the examination.  If the procedure report does not answer your questions, please call your gastroenterologist to clarify.  If you requested that your care partner not be given the details of your procedure findings, then the procedure report has been included in a sealed envelope for you to review at your convenience later.  YOU SHOULD EXPECT: Some feelings of bloating in the abdomen. Passage of more gas than usual.  Walking can help get rid of the air that was put into your GI tract during the procedure and reduce the bloating. If you had a lower endoscopy (such as a colonoscopy or flexible sigmoidoscopy) you may notice spotting of blood in your stool or on the toilet paper. If you underwent a bowel prep for your procedure, you may not have a normal bowel movement for a few days.  Please Note:  You might notice some irritation and congestion in your nose or some drainage.  This is from the oxygen used during your procedure.  There is no need for concern and it should clear up in a day or so.  SYMPTOMS TO REPORT IMMEDIATELY:   Following lower endoscopy (colonoscopy or flexible sigmoidoscopy):  Excessive amounts of blood in the stool  Significant tenderness or worsening of abdominal pains  Swelling of the abdomen that is new, acute  Fever of 100F or higher   Following upper endoscopy (EGD)  Vomiting of blood or coffee ground material  New chest pain or pain under the shoulder blades  Painful or persistently difficult swallowing  New shortness of breath  Fever of 100F or higher  Black, tarry-looking stools  For urgent or emergent issues, a gastroenterologist can be reached at any hour by calling [phone].   DIET:  We do recommend a small meal at first, but then you may proceed to your regular diet.  Drink  plenty of fluids but you should avoid alcoholic beverages for 24 hours.  ACTIVITY:  You should plan to take it easy for the rest of today and you should NOT DRIVE or use heavy machinery until tomorrow (because of the sedation medicines used during the test).    FOLLOW UP: Our staff will call the number listed on your records the next business day following your procedure to check on you and address any questions or concerns that you may have regarding the information given to you following your procedure. If we do not reach you, we will leave a message.  However, if you are feeling well and you are not experiencing any problems, there is no need to return our call.  We will assume that you have returned to your regular daily activities without incident.  If any biopsies were taken you will be contacted by phone or by letter within the next 1-3 weeks.  Please call us at [phone] if you have not heard about the biopsies in 3 weeks.   SIGNATURES/CONFIDENTIALITY: You and/or your care partner have signed paperwork which will be entered into your electronic medical record.  These signatures attest to the fact that that the information above on your After Visit Summary has been reviewed and is understood.  Full responsibility of the confidentiality of this discharge information lies with you and/or your care-partner.  Normal colonoscopy- next colonoscopy at age 36  Please read over handouts  about hemorrhoids, gastritis, esophagitis and hiatal hernias  Make sure you are taking Dexilant 20-30 minutes before first meal of they day; take Zantac 75 mg daily at bedtime  Continue your normal medications

## 2017-03-16 NOTE — Op Note (Signed)
[facility] Patient Name: Shane Cole Procedure Date: 03/16/2017 10:22 AM MRN: [id number] Endoscopist: Rachael Fee , MD Age: 36 Referring MD:  Date of Birth: 01/03/1981 Gender: Male Account #: [id number] Procedure:                Upper GI endoscopy Indications:              Dysphagia Medicines:                Monitored Anesthesia Care Procedure:                Pre-Anesthesia Assessment:                           - Prior to the procedure, a History and Physical                            was performed, and patient medications and                            allergies were reviewed. The patient's tolerance of                            previous anesthesia was also reviewed. The risks                            and benefits of the procedure and the sedation                            options and risks were discussed with the patient.                            All questions were answered, and informed consent                            was obtained. Prior Anticoagulants: The patient has                            taken no previous anticoagulant or antiplatelet                            agents. ASA Grade Assessment: II - A patient with                            mild systemic disease. After reviewing the risks                            and benefits, the patient was deemed in                            satisfactory condition to undergo the procedure.                           After obtaining informed consent, the endoscope was  passed under direct vision. Throughout the                            procedure, the patient's blood pressure, pulse, and                            oxygen saturations were monitored continuously. The                            Model GIF-HQ190 [id number](SN#[id number]) scope was introduced                            through the mouth, and advanced to the second part                            of duodenum. The upper GI endoscopy was                            accomplished without difficulty. The patient                            tolerated the procedure well. Scope In: Scope Out: Findings:                 LA Grade B (one or more mucosal breaks greater than                            5 mm, not extending between the tops of two mucosal                            folds) esophagitis was found in the lower third of                            the esophagus.                           A small hiatal hernia was present.                           Mild inflammation characterized by erythema and                            friability was found in the gastric antrum.                            Biopsies were taken with a cold forceps for                            histology.                           The examined duodenum was normal. Complications:            No immediate complications. Estimated blood loss:  None. Estimated Blood Loss:     Estimated blood loss: none. Impression:               - LA Grade B esophagitis.                           - Small hiatal hernia.                           - Gastritis. Biopsied.                           - Normal examined duodenum. Recommendation:           - Patient has a contact number available for                            emergencies. The signs and symptoms of potential                            delayed complications were discussed with the                            patient. Return to normal activities tomorrow.                            Written discharge instructions were provided to the                            patient.                           - Resume previous diet.                           - Continue present medications. Please make sure                            you are taking the dexlinant 20-30 min before                            breakfast meal. Please take the Zantac 75mg  pill,                            one pill at bedtime every night.                            - Await pathology results. I Rachael Feeaniel P Chirstopher Iovino, MD 03/16/2017 10:55:54 AM This report has been signed electronically.

## 2017-03-16 NOTE — Progress Notes (Signed)
Report to PACU, RN, vss, BBS= Clear.  

## 2017-03-16 NOTE — Progress Notes (Signed)
Called to room to assist during endoscopic procedure.  Patient ID and intended procedure confirmed with present staff. Received instructions for my participation in the procedure from the performing physician.  

## 2017-03-19 ENCOUNTER — Telehealth: Payer: Self-pay

## 2017-03-19 NOTE — Telephone Encounter (Signed)
  Follow up Call-  Call back number 03/16/2017  Post procedure Call Back phone  # [phone]  Permission to leave phone message Yes  Some recent data might be hidden     Patient questions:  Do you have a fever, pain , or abdominal swelling? No. Pain Score  0 *  Have you tolerated food without any problems? Yes.    Have you been able to return to your normal activities? Yes.    Do you have any questions about your discharge instructions: Diet   No. Medications  No. Follow up visit  No.  Do you have questions or concerns about your Care? No.  Actions: * If pain score is 4 or above: No action needed, pain <4.

## 2017-03-19 NOTE — Telephone Encounter (Signed)
No answer and patient voicemail is not set up. Will call back later today.

## 2017-03-30 ENCOUNTER — Encounter: Payer: Self-pay | Admitting: Gastroenterology

## 2017-04-02 ENCOUNTER — Telehealth: Payer: Self-pay | Admitting: Gastroenterology

## 2017-04-02 NOTE — Telephone Encounter (Signed)
The pt has been advised of the path results     March 30, 2017   [name] [address] [place] [address]   Dear [name],  The biopsies taken during your recent upper endoscopy showed no sign of infection or cancer.  You should continue to follow the recommendations that we discussed at the time of your procedure.   If you have any questions or concerns, please don't hesitate to call.  Sincerely,    Rachael Feeaniel P Jacobs, MD

## 2017-04-06 ENCOUNTER — Other Ambulatory Visit: Payer: Self-pay | Admitting: Physician Assistant

## 2017-05-12 ENCOUNTER — Ambulatory Visit: Payer: 59 | Admitting: Adult Health

## 2017-05-13 ENCOUNTER — Other Ambulatory Visit: Payer: Self-pay | Admitting: Adult Health

## 2017-05-13 DIAGNOSIS — K2100 Gastro-esophageal reflux disease with esophagitis, without bleeding: Secondary | ICD-10-CM

## 2017-05-13 NOTE — Telephone Encounter (Signed)
Do you need to see pt back for cpx?

## 2017-05-13 NOTE — Telephone Encounter (Signed)
It is ok to refill for 90 days. He does need a CPE

## 2017-05-14 NOTE — Telephone Encounter (Signed)
Sent to the pharmacy by e-scribe.  Pt now scheduled for cpx on 06/17/17 @ 9:30.  Pt notified to come fasting.

## 2017-05-19 ENCOUNTER — Other Ambulatory Visit: Payer: Self-pay | Admitting: Physician Assistant

## 2017-05-27 ENCOUNTER — Telehealth: Payer: Self-pay | Admitting: Adult Health

## 2017-05-27 NOTE — Telephone Encounter (Signed)
[name] will be out of the office 05/28/17-05/29/17  Was not able to leave a message. The first number does not have a voice mail set up and the other number is not available.

## 2017-05-28 ENCOUNTER — Ambulatory Visit: Payer: Self-pay | Admitting: Adult Health

## 2017-06-02 ENCOUNTER — Encounter: Payer: Self-pay | Admitting: Adult Health

## 2017-06-02 ENCOUNTER — Ambulatory Visit (INDEPENDENT_AMBULATORY_CARE_PROVIDER_SITE_OTHER): Payer: No Typology Code available for payment source | Admitting: Adult Health

## 2017-06-02 VITALS — BP 120/82 | Temp 98.0°F | Wt 220.0 lb

## 2017-06-02 DIAGNOSIS — G8929 Other chronic pain: Secondary | ICD-10-CM

## 2017-06-02 DIAGNOSIS — M545 Low back pain, unspecified: Secondary | ICD-10-CM

## 2017-06-02 DIAGNOSIS — Z760 Encounter for issue of repeat prescription: Secondary | ICD-10-CM

## 2017-06-02 DIAGNOSIS — I10 Essential (primary) hypertension: Secondary | ICD-10-CM | POA: Diagnosis not present

## 2017-06-02 MED ORDER — SILDENAFIL CITRATE 20 MG PO TABS: 20.0000 mg | ORAL_TABLET | Freq: Two times a day (BID) | ORAL | 6 refills | Status: AC

## 2017-06-02 NOTE — Progress Notes (Signed)
Subjective:    Patient ID: Shane LandryPaul D Rockwell Jr., male    DOB: 06/03/1980, 37 y.o.   MRN: [id number]  HPI Patient presents today for referral to neurology.  He was previously seeing Dr Estella [place]kunheim in [place], who is now out of network.  He sees him for low back pain and left leg pain and tingling. He has to go through PCP for referrals.   Review of Systems  Respiratory: Positive for chest tightness. Negative for shortness of breath.   Cardiovascular: Negative for chest pain.  Gastrointestinal: Positive for abdominal pain, diarrhea and nausea. Negative for vomiting.  Genitourinary: Positive for dysuria. Negative for testicular pain and urgency.  Neurological: Positive for weakness and numbness.       LLE      Past Medical History:  Diagnosis Date  . Allergy   . Anxiety   . Bipolar disorder (HCC)   . Bronchitis   . Crush injury lower leg    Left lower leg  . Depression   . Gastric ulcer   . GERD (gastroesophageal reflux disease)    will awaken him in night  . Migraine   . Nerve pain   . RSD (reflex sympathetic dystrophy)     Social History   Socioeconomic History  . Marital status: Married    Spouse name: Not on file  . Number of children: 1  . Years of education: 10th grade  . Highest education level: Not on file  Social Needs  . Financial resource strain: Not on file  . Food insecurity - worry: Not on file  . Food insecurity - inability: Not on file  . Transportation needs - medical: Not on file  . Transportation needs - non-medical: Not on file  Occupational History  . Occupation: Disabled    Comment: Previously worked Web designerinstalling granite counter tops  Tobacco Use  . Smoking status: Current Every Day Smoker    Packs/day: 1.00    Years: 23.00    Pack years: 23.00    Types: Cigarettes, E-cigarettes  . Smokeless tobacco: Current User  Substance and Sexual Activity  . Alcohol use: Yes    Alcohol/week: 3.6 oz    Types: 6 Cans of beer per week    Comment:  occasional  . Drug use: No  . Sexual activity: Yes    Partners: Female  Other Topics Concern  . Not on file  Social History Narrative   05/24/12  [name] was born [place]. He has 2 sisters. He reports that his childhood was "lousy." He completed the 10th grade. He has been married for 5 years, and is currently separated for 3 weeks. He has a daughter who is 613-1/36 years old. He has been unemployed for 2 years, and is disabled due to an on-the-job work accident. He is currently living with his aunt and cousin. He reports that his hobbies are sports and dogs. He reports that he is spiritual, but not religious. He states that his aunt and his father are his social support system. He denies any current legal problems, but got a DUI in 2007. 05/24/12 [facility]          Past Surgical History:  Procedure Laterality Date  . CHOLECYSTECTOMY N/A 01/24/2014   Procedure: LAPAROSCOPIC CHOLECYSTECTOMY;  Surgeon: Axel FillerArmando Ramirez, MD;  Location: [facility] OR;  Service: General;  Laterality: N/A;  . COLONOSCOPY    . HAND SURGERY    . MASS EXCISION Left 01/25/2013   Procedure:  EXCISION MASS DORSAL ASPECT LEFT LONG FINGER DISTAL INTERPHALANGEAL JOINT;  Surgeon: Wyn Forster., MD;  Location: [facility];  Service: Orthopedics;  Laterality: Left;  Left long   . MOUTH SURGERY      Family History  Problem Relation Age of Onset  . Hyperlipidemia Father   . Hypertension Father   . Anxiety disorder Father   . Drug abuse Father   . Cancer Paternal Grandfather        lung, colon  . Colon cancer Paternal Grandfather   . Lung cancer Paternal Grandfather   . Diabetes Paternal Grandmother   . Cancer Paternal Grandmother   . Cancer Maternal Grandmother   . Cancer Maternal Grandfather   . Lung cancer Maternal Grandfather   . Stroke Unknown   . Heart disease Unknown   . Depression Paternal Aunt   . Anxiety disorder Paternal Aunt   . Drug abuse Paternal Uncle     Allergies    Allergen Reactions  . Aspirin Other (See Comments)    Tinnitus, dizzy, short of breath  . Codeine Itching    Reaction to tylenol #3  . Penicillins Anaphylaxis    Was told never to take medication.  Has patient had a PCN reaction causing immediate rash, facial/tongue/throat swelling, SOB or lightheadedness with hypotension: no Has patient had a PCN reaction causing severe rash involving mucus membranes or skin necrosis: no Has patient had a PCN reaction that required hospitalization: no Has patient had a PCN reaction occurring within the last 10 years: no If all of the above answers are "NO", then may proceed with Cephalosporin use.   . Amitriptyline Rash    Current Outpatient Medications on File Prior to Visit  Medication Sig Dispense Refill  . clonazePAM (KLONOPIN) 1 MG tablet Take 1 tablet by mouth at bedtime.  2  . DEXILANT 30 MG capsule TAKE 1 CAPSULE BY MOUTH ONCE DAILY 60 capsule 0  . gabapentin (NEURONTIN) 300 MG capsule Take 1 capsule (300 mg total) by mouth 3 (three) times daily. 270 capsule 3  . lidocaine (LINDAMANTLE) 3 % CREA cream   3  . Multiple Vitamin (MULTIVITAMIN WITH MINERALS) TABS tablet Take 1 tablet by mouth daily.    . nortriptyline (PAMELOR) 25 MG capsule Take 1 capsule (25 mg total) by mouth at bedtime. (Patient taking differently: Take 75 mg by mouth at bedtime. ) 30 capsule 1  . ondansetron (ZOFRAN-ODT) 4 MG disintegrating tablet DISSOLVE 1 TABLET ON TONGUE EVERY 8 HOURS AS NEEDED FOR NAUSEA AND VOMITING 20 tablet 1  . oxyCODONE-acetaminophen (PERCOCET) 10-325 MG tablet Take 1 tablet by mouth 3 (three) times daily.  0  . ranitidine (ZANTAC) 150 MG tablet Take 1 tablet (150 mg total) by mouth at bedtime. (Patient taking differently: Take 150 mg by mouth 3 (three) times daily as needed for heartburn. ) 30 tablet 2  . sildenafil (REVATIO) 20 MG tablet take one tablet by mouth twice daily.  5  . tiZANidine (ZANAFLEX) 4 MG tablet Take 1 tablet by mouth 3 (three)  times daily as needed.  6   Current Facility-Administered Medications on File Prior to Visit  Medication Dose Route Frequency Provider Last Rate Last Dose  . 0.9 %  sodium chloride infusion  500 mL Intravenous Continuous Rachael Fee, MD        BP 120/82 (BP Location: Left Arm)   Temp 98 F (36.7 C) (Oral)   Wt 220 lb (99.8 kg)   BMI 30.68  kg/m    Objective:   Physical Exam  Constitutional: He is oriented to person, place, and time. He appears well-developed and well-nourished. No distress.  Cardiovascular: Normal rate, regular rhythm and normal heart sounds. Exam reveals no gallop and no friction rub.  No murmur heard. Pulmonary/Chest: Effort normal and breath sounds normal. No respiratory distress. He has no wheezes. He has no rales.  Abdominal: Soft. Bowel sounds are normal. He exhibits no distension and no mass. There is no hepatosplenomegaly. There is tenderness in the right lower quadrant. There is tenderness at McBurney's point. There is no rebound and no guarding.  Neurological: He is alert and oriented to person, place, and time.  Skin: Skin is warm and dry. He is not diaphoretic.  Nursing note and vitals reviewed.     Assessment & Plan:  1. Essential hypertension Continue to monitor. - sildenafil (REVATIO) 20 MG tablet; Take 1 tablet (20 mg total) by mouth 2 (two) times daily.  Dispense: 60 tablet; Refill: 6  2. Chronic bilateral low back pain without sciatica Needs referral through PCP for specialty practices.  - Ambulatory referral to Neurology  3. Medication refill Refills of chronic medications provided.   Darrion Macaulay C Chianne Byrns BSN RN NP student.

## 2017-06-02 NOTE — Progress Notes (Signed)
Subjective:    Patient ID: Shane Cole., male    DOB: 1980-10-15, 37 y.o.   MRN: [id number]  HPI  37 year old male who  has a past medical history of Allergy, Anxiety, Bipolar disorder (HCC), Bronchitis, Crush injury lower leg, Depression, Gastric ulcer, GERD (gastroesophageal reflux disease), Migraine, Nerve pain, and RSD (reflex sympathetic dystrophy).  He presents to the office today for referrals to neurology  He has new insurance and needs referrals placed for Neurology. His current Neurologist is no longer in network.   He also needs a refill of generic Viagra.   Review of Systems See HPI   Past Medical History:  Diagnosis Date  . Allergy   . Anxiety   . Bipolar disorder (HCC)   . Bronchitis   . Crush injury lower leg    Left lower leg  . Depression   . Gastric ulcer   . GERD (gastroesophageal reflux disease)    will awaken him in night  . Migraine   . Nerve pain   . RSD (reflex sympathetic dystrophy)     Social History   Socioeconomic History  . Marital status: Married    Spouse name: Not on file  . Number of children: 1  . Years of education: 10th grade  . Highest education level: Not on file  Social Needs  . Financial resource strain: Not on file  . Food insecurity - worry: Not on file  . Food insecurity - inability: Not on file  . Transportation needs - medical: Not on file  . Transportation needs - non-medical: Not on file  Occupational History  . Occupation: Disabled    Comment: Previously worked Web designer  Tobacco Use  . Smoking status: Current Every Day Smoker    Packs/day: 1.00    Years: 23.00    Pack years: 23.00    Types: Cigarettes, E-cigarettes  . Smokeless tobacco: Current User  Substance and Sexual Activity  . Alcohol use: Yes    Alcohol/week: 3.6 oz    Types: 6 Cans of beer per week    Comment: occasional  . Drug use: No  . Sexual activity: Yes    Partners: Female  Other Topics Concern  . Not on  file  Social History Narrative   05/24/12  Amiere was born and grew up in [place], [place]. He has 2 sisters. He reports that his childhood was "lousy." He completed the 10th grade. He has been married for 5 years, and is currently separated for 3 weeks. He has a daughter who is 85-1/2 years old. He has been unemployed for 2 years, and is disabled due to an on-the-job work accident. He is currently living with his aunt and cousin. He reports that his hobbies are sports and dogs. He reports that he is spiritual, but not religious. He states that his aunt and his father are his social support system. He denies any current legal problems, but got a DUI in 2007. 05/24/12 [facility]          Past Surgical History:  Procedure Laterality Date  . CHOLECYSTECTOMY N/A 01/24/2014   Procedure: LAPAROSCOPIC CHOLECYSTECTOMY;  Surgeon: Axel Filler, MD;  Location: [facility] OR;  Service: General;  Laterality: N/A;  . COLONOSCOPY    . HAND SURGERY    . MASS EXCISION Left 01/25/2013   Procedure: EXCISION MASS DORSAL ASPECT LEFT LONG FINGER DISTAL INTERPHALANGEAL JOINT;  Surgeon: Wyn Forster., MD;  Location:  [facility];  Service: Orthopedics;  Laterality: Left;  Left long   . MOUTH SURGERY      Family History  Problem Relation Age of Onset  . Hyperlipidemia Father   . Hypertension Father   . Anxiety disorder Father   . Drug abuse Father   . Cancer Paternal Grandfather        lung, colon  . Colon cancer Paternal Grandfather   . Lung cancer Paternal Grandfather   . Diabetes Paternal Grandmother   . Cancer Paternal Grandmother   . Cancer Maternal Grandmother   . Cancer Maternal Grandfather   . Lung cancer Maternal Grandfather   . Stroke Unknown   . Heart disease Unknown   . Depression Paternal Aunt   . Anxiety disorder Paternal Aunt   . Drug abuse Paternal Uncle     Allergies  Allergen Reactions  . Aspirin Other (See Comments)    Tinnitus, dizzy, short of breath  . Codeine Itching     Reaction to tylenol #3  . Penicillins Anaphylaxis    Was told never to take medication.  Has patient had a PCN reaction causing immediate rash, facial/tongue/throat swelling, SOB or lightheadedness with hypotension: no Has patient had a PCN reaction causing severe rash involving mucus membranes or skin necrosis: no Has patient had a PCN reaction that required hospitalization: no Has patient had a PCN reaction occurring within the last 10 years: no If all of the above answers are "NO", then may proceed with Cephalosporin use.   . Amitriptyline Rash    Current Outpatient Medications on File Prior to Visit  Medication Sig Dispense Refill  . clonazePAM (KLONOPIN) 1 MG tablet Take 1 tablet by mouth at bedtime.  2  . DEXILANT 30 MG capsule TAKE 1 CAPSULE BY MOUTH ONCE DAILY 60 capsule 0  . gabapentin (NEURONTIN) 300 MG capsule Take 1 capsule (300 mg total) by mouth 3 (three) times daily. 270 capsule 3  . lidocaine (LINDAMANTLE) 3 % CREA cream   3  . Multiple Vitamin (MULTIVITAMIN WITH MINERALS) TABS tablet Take 1 tablet by mouth daily.    . nortriptyline (PAMELOR) 25 MG capsule Take 1 capsule (25 mg total) by mouth at bedtime. (Patient taking differently: Take 75 mg by mouth at bedtime. ) 30 capsule 1  . ondansetron (ZOFRAN-ODT) 4 MG disintegrating tablet DISSOLVE 1 TABLET ON TONGUE EVERY 8 HOURS AS NEEDED FOR NAUSEA AND VOMITING 20 tablet 1  . oxyCODONE-acetaminophen (PERCOCET) 10-325 MG tablet Take 1 tablet by mouth 3 (three) times daily.  0  . ranitidine (ZANTAC) 150 MG tablet Take 1 tablet (150 mg total) by mouth at bedtime. (Patient taking differently: Take 150 mg by mouth 3 (three) times daily as needed for heartburn. ) 30 tablet 2  . sildenafil (REVATIO) 20 MG tablet take one tablet by mouth twice daily.  5  . tiZANidine (ZANAFLEX) 4 MG tablet Take 1 tablet by mouth 3 (three) times daily as needed.  6   Current Facility-Administered Medications on File Prior to Visit  Medication Dose  Route Frequency Provider Last Rate Last Dose  . 0.9 %  sodium chloride infusion  500 mL Intravenous Continuous Rachael Fee, MD        BP 120/82 (BP Location: Left Arm)   Temp 98 F (36.7 C) (Oral)   Wt 220 lb (99.8 kg)   BMI 30.68 kg/m       Objective:   Physical Exam  Constitutional: He is oriented to person, place, and time. He  appears well-developed and well-nourished. No distress.  Cardiovascular: Normal rate, regular rhythm, normal heart sounds and intact distal pulses. Exam reveals no gallop and no friction rub.  No murmur heard. Pulmonary/Chest: Effort normal and breath sounds normal. No respiratory distress.  Neurological: He is alert and oriented to person, place, and time.  Skin: Skin is warm and dry. No rash noted. He is not diaphoretic. No erythema. No pallor.  Psychiatric: He has a normal mood and affect. His behavior is normal. Judgment and thought content normal.  Nursing note and vitals reviewed.     Assessment & Plan:  1. Essential hypertension  - sildenafil (REVATIO) 20 MG tablet; Take 1 tablet (20 mg total) by mouth 2 (two) times daily.  Dispense: 60 tablet; Refill: 6  2. Chronic bilateral low back pain without sciatica  - Ambulatory referral to Neurology  Shirline Freesory Dilana Mcphie, NP

## 2017-06-04 ENCOUNTER — Telehealth: Payer: Self-pay | Admitting: Family Medicine

## 2017-06-04 ENCOUNTER — Ambulatory Visit: Payer: Self-pay | Admitting: Neurology

## 2017-06-04 DIAGNOSIS — M545 Low back pain, unspecified: Secondary | ICD-10-CM

## 2017-06-04 DIAGNOSIS — G8929 Other chronic pain: Secondary | ICD-10-CM

## 2017-06-04 NOTE — Telephone Encounter (Signed)
Copied from CRM [id number]#[id number]. Topic: Referral - Question >> Jun 04, 2017  1:18 PM Clack, Princella PellegriniJessica D wrote: Reason for CRM:  Pt would like to know if the PCP could put a referral in to a different neurology.  He would like to see Dr. Theora MasterZachary Potter, [address], [place], [place] [id number], [phone]

## 2017-06-04 NOTE — Telephone Encounter (Signed)
Referral placed.  No further action required.  Closing note.

## 2017-06-08 ENCOUNTER — Telehealth: Payer: No Typology Code available for payment source | Admitting: Family

## 2017-06-08 DIAGNOSIS — J329 Chronic sinusitis, unspecified: Secondary | ICD-10-CM

## 2017-06-08 DIAGNOSIS — B9689 Other specified bacterial agents as the cause of diseases classified elsewhere: Secondary | ICD-10-CM

## 2017-06-08 MED ORDER — FLUTICASONE PROPIONATE 50 MCG/ACT NA SUSP: 2.0000 | Freq: Every day | NASAL | 6 refills | Status: DC

## 2017-06-08 MED ORDER — DOXYCYCLINE HYCLATE 100 MG PO TABS: 100.0000 mg | ORAL_TABLET | Freq: Two times a day (BID) | ORAL | 0 refills | Status: DC

## 2017-06-08 NOTE — Progress Notes (Signed)
Thank you for the details you included in the comment boxes. Those details are very helpful in determining the best course of treatment for you and help us to provide the best care.  We are sorry that you are not feeling well.  Here is how we plan to help!  Based on what you have shared with me it looks like you have sinusitis.  Sinusitis is inflammation and infection in the sinus cavities of the head.  Based on your presentation I believe you most likely have Acute Bacterial Sinusitis.  This is an infection caused by bacteria and is treated with antibiotics. I have prescribed Doxycycline 100mg  by mouth twice a day for 10 days. You may use an oral decongestant such as Mucinex D or if you have glaucoma or high blood pressure use plain Mucinex. Saline nasal spray help and can safely be used as often as needed for congestion.  If you develop worsening sinus pain, fever or notice severe headache and vision changes, or if symptoms are not better after completion of antibiotic, please schedule an appointment with a health care provider.    Additionally, I have prescribed Flonase, take 2 sprays in each nostril daily.   Sinus infections are not as easily transmitted as other respiratory infection, however we still recommend that you avoid close contact with loved ones, especially the very young and elderly.  Remember to wash your hands thoroughly throughout the day as this is the number one way to prevent the spread of infection!  Home Care:  Only take medications as instructed by your medical team.  Complete the entire course of an antibiotic.  Do not take these medications with alcohol.  A steam or ultrasonic humidifier can help congestion.  You can place a towel over your head and breathe in the steam from hot water coming from a faucet.  Avoid close contacts especially the very young and the elderly.  Cover your mouth when you cough or sneeze.  Always remember to wash your hands.  Get Help  Right Away If:  You develop worsening fever or sinus pain.  You develop a severe head ache or visual changes.  Your symptoms persist after you have completed your treatment plan.  Make sure you  Understand these instructions.  Will watch your condition.  Will get help right away if you are not doing well or get worse.  Your e-visit answers were reviewed by a board certified advanced clinical practitioner to complete your personal care plan.  Depending on the condition, your plan could have included both over the counter or prescription medications.  If there is a problem please reply  once you have received a response from your provider.  Your safety is important to us.  If you have drug allergies check your prescription carefully.    You can use MyChart to ask questions about today's visit, request a non-urgent call back, or ask for a work or school excuse for 24 hours related to this e-Visit. If it has been greater than 24 hours you will need to follow up with your provider, or enter a new e-Visit to address those concerns.  You will get an e-mail in the next two days asking about your experience.  I hope that your e-visit has been valuable and will speed your recovery. Thank you for using e-visits.

## 2017-06-17 ENCOUNTER — Encounter: Payer: Self-pay | Admitting: Adult Health

## 2017-06-17 ENCOUNTER — Ambulatory Visit (INDEPENDENT_AMBULATORY_CARE_PROVIDER_SITE_OTHER): Payer: No Typology Code available for payment source | Admitting: Adult Health

## 2017-06-17 VITALS — BP 130/94 | Temp 98.2°F | Ht 68.5 in | Wt 218.0 lb

## 2017-06-17 DIAGNOSIS — Z760 Encounter for issue of repeat prescription: Secondary | ICD-10-CM

## 2017-06-17 DIAGNOSIS — I10 Essential (primary) hypertension: Secondary | ICD-10-CM | POA: Diagnosis not present

## 2017-06-17 DIAGNOSIS — K219 Gastro-esophageal reflux disease without esophagitis: Secondary | ICD-10-CM

## 2017-06-17 DIAGNOSIS — Z0001 Encounter for general adult medical examination with abnormal findings: Secondary | ICD-10-CM

## 2017-06-17 DIAGNOSIS — F1190 Opioid use, unspecified, uncomplicated: Secondary | ICD-10-CM

## 2017-06-17 DIAGNOSIS — H6690 Otitis media, unspecified, unspecified ear: Secondary | ICD-10-CM

## 2017-06-17 DIAGNOSIS — J0140 Acute pansinusitis, unspecified: Secondary | ICD-10-CM

## 2017-06-17 DIAGNOSIS — Z0000 Encounter for general adult medical examination without abnormal findings: Secondary | ICD-10-CM

## 2017-06-17 DIAGNOSIS — F17200 Nicotine dependence, unspecified, uncomplicated: Secondary | ICD-10-CM

## 2017-06-17 LAB — BASIC METABOLIC PANEL
BUN: 16 mg/dL (ref 6–23)
CHLORIDE: 104 meq/L (ref 96–112)
CO2: 31 meq/L (ref 19–32)
CREATININE: 0.91 mg/dL (ref 0.40–1.50)
Calcium: 9.9 mg/dL (ref 8.4–10.5)
GFR: 99.2 mL/min (ref >60.00)
Glucose, Bld: 100 mg/dL — ABNORMAL HIGH (ref 70–99)
POTASSIUM: 4.7 meq/L (ref 3.5–5.1)
Sodium: 142 mEq/L (ref 135–145)

## 2017-06-17 LAB — HEPATIC FUNCTION PANEL
ALBUMIN: 4.5 g/dL (ref 3.5–5.2)
ALT: 101 U/L — AB (ref 0–53)
AST: 34 U/L (ref 0–37)
Alkaline Phosphatase: 96 U/L (ref 39–117)
Bilirubin, Direct: 0.1 mg/dL (ref 0.0–0.3)
TOTAL PROTEIN: 7 g/dL (ref 6.0–8.3)
Total Bilirubin: 0.3 mg/dL (ref 0.2–1.2)

## 2017-06-17 LAB — CBC WITH DIFFERENTIAL/PLATELET
BASOS PCT: 0.4 % (ref 0.0–3.0)
Basophils Absolute: 0 10*3/uL (ref 0.0–0.1)
EOS ABS: 0.4 10*3/uL (ref 0.0–0.7)
EOS PCT: 4.5 % (ref 0.0–5.0)
HEMATOCRIT: 46.3 % (ref 39.0–52.0)
HEMOGLOBIN: 15.7 g/dL (ref 13.0–17.0)
LYMPHS PCT: 16.2 % (ref 12.0–46.0)
Lymphs Abs: 1.3 10*3/uL (ref 0.7–4.0)
MCHC: 33.9 g/dL (ref 30.0–36.0)
MCV: 93.1 fl (ref 78.0–100.0)
Monocytes Absolute: 0.5 10*3/uL (ref 0.1–1.0)
Monocytes Relative: 6.2 % (ref 3.0–12.0)
NEUTROS ABS: 5.9 10*3/uL (ref 1.4–7.7)
Neutrophils Relative %: 72.7 % (ref 43.0–77.0)
PLATELETS: 272 10*3/uL (ref 150.0–400.0)
RBC: 4.97 Mil/uL (ref 4.22–5.81)
RDW: 13.6 % (ref 11.5–15.5)
WBC: 8.1 10*3/uL (ref 4.0–10.5)

## 2017-06-17 LAB — LIPID PANEL
CHOLESTEROL: 152 mg/dL (ref 0–200)
HDL: 53.3 mg/dL (ref >39.00)
LDL Cholesterol: 72 mg/dL (ref 0–99)
NonHDL: 98.85
TRIGLYCERIDES: 132 mg/dL (ref 0.0–149.0)
Total CHOL/HDL Ratio: 3
VLDL: 26.4 mg/dL (ref 0.0–40.0)

## 2017-06-17 LAB — TSH: TSH: 0.72 u[IU]/mL (ref 0.35–4.50)

## 2017-06-17 MED ORDER — VARENICLINE TARTRATE 0.5 MG X 11 & 1 MG X 42 PO MISC: ORAL | 0 refills | Status: DC

## 2017-06-17 MED ORDER — NORTRIPTYLINE HCL 75 MG PO CAPS: 75.0000 mg | ORAL_CAPSULE | Freq: Every day | ORAL | 1 refills | Status: DC

## 2017-06-17 MED ORDER — CEFDINIR 300 MG PO CAPS: 300.0000 mg | ORAL_CAPSULE | Freq: Two times a day (BID) | ORAL | 0 refills | Status: DC

## 2017-06-17 MED ORDER — TRAMADOL HCL 50 MG PO TABS
50.0000 mg | ORAL_TABLET | Freq: Three times a day (TID) | ORAL | 0 refills | Status: DC | PRN
Start: 2017-06-17 — End: 2017-07-09

## 2017-06-17 NOTE — Patient Instructions (Signed)
I have sent in a prescription for an antibiotics called Cefdinir - this will help with your upper respiratory infection   I will follow up with you regarding your blood work   Let me know how you are doing in one month with quitting smoking

## 2017-06-17 NOTE — Progress Notes (Signed)
Subjective:    Patient ID: Shane Cole., male    DOB: 1981-03-09, 37 y.o.   MRN: [id number]  HPI Patient presents for yearly preventative medicine examination. He is a pleasant 37 year old male who  has a past medical history of Allergy, Anxiety, Bipolar disorder (HCC), Bronchitis, Crush injury lower leg, Depression, Gastric ulcer, GERD (gastroesophageal reflux disease), Migraine, Nerve pain, and RSD (reflex sympathetic dystrophy).  He takes Dexilant for GERD which is controlled well. He takes Zantac 150 mg for break through GERD symptoms.   He takes Revatio 20 mg BID for hypertension. This was originally prescribed by his Neurologist. We have taken over this medication   Chronic Narcotic Use -he was sleeping traded for chronic pain syndrome due to crush injury that he sustained multiple years ago by his neurologist.  He was recently referred over to [facility] this is current neurologist with out of network with his new insurance.  Not currently taking any pain medication, and is asking respectfully if I would be willing to prescribe a short course of tramadol to get him through until he is seen.  Sinusitis -was treated recently via E visit for sinusitis and prescribed a 7-day course of doxycycline.  His antibiotic ends today and he is feeling no better.  Continues to have ear pain, shortness of breath, productive cough with chest congestion, sinus pain and pressure and headaches.  Denies any fevers but does feel acutely ill  Tobacco Use -he continues to smoke and has been working on cutting back on the amount of cigarettes he smokes.  Currently at approximately 1/2 pack/day.  He would like to try Chantix again to see if this will finally help him quit smoking.  All immunizations and health maintenance protocols were reviewed with the patient and needed orders were placed.  He is up-to-date on all vaccinations  Appropriate screening laboratory values were ordered for the patient  including screening of hyperlipidemia, renal function and hepatic function.  Medication reconciliation,  past medical history, social history, problem list and allergies were reviewed in detail with the patient  Goals were established with regard to weight loss, exercise, and  diet in compliance with medications.  Has been working on diet and he and his family have been going to the gym and spending more time outdoors.  He has been able to lose 2 pounds since December Wt Readings from Last 3 Encounters:  06/17/17 218 lb (98.9 kg)  06/02/17 220 lb (99.8 kg)  03/16/17 220 lb (99.8 kg)     Review of Systems  Constitutional: Positive for activity change and fatigue. Negative for chills and fever.  HENT: Positive for congestion, ear pain, postnasal drip, rhinorrhea, sinus pressure and sinus pain. Negative for ear discharge and sore throat.   Eyes: Negative.   Respiratory: Positive for cough, chest tightness and shortness of breath.   Cardiovascular: Negative.   Gastrointestinal: Negative.   Endocrine: Negative.   Genitourinary: Negative.   Musculoskeletal: Positive for arthralgias and gait problem.  Skin: Negative.   Allergic/Immunologic: Negative.   Hematological: Negative.   Psychiatric/Behavioral: Negative.   All other systems reviewed and are negative.  Past Medical History:  Diagnosis Date  . Allergy   . Anxiety   . Bipolar disorder (HCC)   . Bronchitis   . Crush injury lower leg    Left lower leg  . Depression   . Gastric ulcer   . GERD (gastroesophageal reflux disease)    will awaken him in  night  . Migraine   . Nerve pain   . RSD (reflex sympathetic dystrophy)     Social History   Socioeconomic History  . Marital status: Married    Spouse name: Not on file  . Number of children: 1  . Years of education: 10th grade  . Highest education level: Not on file  Occupational History  . Occupation: Disabled    Comment: Previously worked Theatre manager  . Financial resource strain: Not on file  . Food insecurity:    Worry: Not on file    Inability: Not on file  . Transportation needs:    Medical: Not on file    Non-medical: Not on file  Tobacco Use  . Smoking status: Current Every Day Smoker    Packs/day: 1.00    Years: 23.00    Pack years: 23.00    Types: Cigarettes, E-cigarettes  . Smokeless tobacco: Current User  Substance and Sexual Activity  . Alcohol use: Yes    Alcohol/week: 3.6 oz    Types: 6 Cans of beer per week    Comment: occasional  . Drug use: No  . Sexual activity: Yes    Partners: Female  Lifestyle  . Physical activity:    Days per week: Not on file    Minutes per session: Not on file  . Stress: Not on file  Relationships  . Social connections:    Talks on phone: Not on file    Gets together: Not on file    Attends religious service: Not on file    Active member of club or organization: Not on file    Attends meetings of clubs or organizations: Not on file    Relationship status: Not on file  . Intimate partner violence:    Fear of current or ex partner: Not on file    Emotionally abused: Not on file    Physically abused: Not on file    Forced sexual activity: Not on file  Other Topics Concern  . Not on file  Social History Narrative   05/24/12  Corde was born and grew up in [place], [place]. He has 2 sisters. He reports that his childhood was "lousy." He completed the 10th grade. He has been married for 5 years, and is currently separated for 3 weeks. He has a daughter who is 32-1/2 years old. He has been unemployed for 2 years, and is disabled due to an on-the-job work accident. He is currently living with his aunt and cousin. He reports that his hobbies are sports and dogs. He reports that he is spiritual, but not religious. He states that his aunt and his father are his social support system. He denies any current legal problems, but got a DUI in 2007. 05/24/12 [facility]          Past  Surgical History:  Procedure Laterality Date  . CHOLECYSTECTOMY N/A 01/24/2014   Procedure: LAPAROSCOPIC CHOLECYSTECTOMY;  Surgeon: Axel Filler, MD;  Location: [facility] OR;  Service: General;  Laterality: N/A;  . COLONOSCOPY    . HAND SURGERY    . MASS EXCISION Left 01/25/2013   Procedure: EXCISION MASS DORSAL ASPECT LEFT LONG FINGER DISTAL INTERPHALANGEAL JOINT;  Surgeon: Wyn Forster., MD;  Location: [facility];  Service: Orthopedics;  Laterality: Left;  Left long   . MOUTH SURGERY      Family History  Problem Relation Age of Onset  . Hyperlipidemia Father   .  Hypertension Father   . Anxiety disorder Father   . Drug abuse Father   . Cancer Paternal Grandfather        lung, colon  . Colon cancer Paternal Grandfather   . Lung cancer Paternal Grandfather   . Diabetes Paternal Grandmother   . Cancer Paternal Grandmother   . Cancer Maternal Grandmother   . Cancer Maternal Grandfather   . Lung cancer Maternal Grandfather   . Stroke Unknown   . Heart disease Unknown   . Depression Paternal Aunt   . Anxiety disorder Paternal Aunt   . Drug abuse Paternal Uncle     Allergies  Allergen Reactions  . Aspirin Other (See Comments)    Tinnitus, dizzy, short of breath  . Codeine Itching    Reaction to tylenol #3  . Penicillins Anaphylaxis    Was told never to take medication.  Has patient had a PCN reaction causing immediate rash, facial/tongue/throat swelling, SOB or lightheadedness with hypotension: no Has patient had a PCN reaction causing severe rash involving mucus membranes or skin necrosis: no Has patient had a PCN reaction that required hospitalization: no Has patient had a PCN reaction occurring within the last 10 years: no If all of the above answers are "NO", then may proceed with Cephalosporin use.   . Amitriptyline Rash    Current Outpatient Medications on File Prior to Visit  Medication Sig Dispense Refill  . clonazePAM (KLONOPIN) 1 MG tablet Take  1 tablet by mouth at bedtime.  2  . DEXILANT 30 MG capsule TAKE 1 CAPSULE BY MOUTH ONCE DAILY 60 capsule 0  . doxycycline (VIBRA-TABS) 100 MG tablet Take 1 tablet (100 mg total) by mouth 2 (two) times daily. 20 tablet 0  . fluticasone (FLONASE) 50 MCG/ACT nasal spray Place 2 sprays into both nostrils daily. 16 g 6  . gabapentin (NEURONTIN) 300 MG capsule Take 1 capsule (300 mg total) by mouth 3 (three) times daily. 270 capsule 3  . lidocaine (LINDAMANTLE) 3 % CREA cream   3  . Multiple Vitamin (MULTIVITAMIN WITH MINERALS) TABS tablet Take 1 tablet by mouth daily.    . ondansetron (ZOFRAN-ODT) 4 MG disintegrating tablet DISSOLVE 1 TABLET ON TONGUE EVERY 8 HOURS AS NEEDED FOR NAUSEA AND VOMITING 20 tablet 1  . oxyCODONE-acetaminophen (PERCOCET) 10-325 MG tablet Take 1 tablet by mouth 3 (three) times daily.  0  . ranitidine (ZANTAC) 150 MG tablet Take 1 tablet (150 mg total) by mouth at bedtime. (Patient taking differently: Take 150 mg by mouth 3 (three) times daily as needed for heartburn. ) 30 tablet 2  . sildenafil (REVATIO) 20 MG tablet Take 1 tablet (20 mg total) by mouth 2 (two) times daily. 60 tablet 6  . tiZANidine (ZANAFLEX) 4 MG tablet Take 1 tablet by mouth 3 (three) times daily as needed.  6   Current Facility-Administered Medications on File Prior to Visit  Medication Dose Route Frequency Provider Last Rate Last Dose  . 0.9 %  sodium chloride infusion  500 mL Intravenous Continuous Rachael FeeJacobs, Daniel P, MD        BP (!) 130/94 (BP Location: Right Arm)   Temp 98.2 F (36.8 C) (Oral)   Ht 5' 8.5" (1.74 m) Comment: Without Shoes  Wt 218 lb (98.9 kg)   BMI 32.66 kg/m       Objective:   Physical Exam  Constitutional: He is oriented to person, place, and time. He appears well-developed and well-nourished. No distress.  HENT:  Head: Normocephalic  and atraumatic.  Right Ear: Hearing, external ear and ear canal normal. Tympanic membrane is scarred.  Left Ear: Hearing and external ear  normal. Tympanic membrane is erythematous and bulging.  Nose: Mucosal edema and rhinorrhea present. Right sinus exhibits maxillary sinus tenderness and frontal sinus tenderness. Left sinus exhibits maxillary sinus tenderness and frontal sinus tenderness.  Mouth/Throat: Uvula is midline and mucous membranes are normal. Posterior oropharyngeal erythema present. No oropharyngeal exudate or posterior oropharyngeal edema.  Eyes: Pupils are equal, round, and reactive to light. Conjunctivae and EOM are normal. Right eye exhibits no discharge. Left eye exhibits no discharge. No scleral icterus.  Neck: Normal range of motion. Neck supple. No JVD present. No tracheal deviation present. No thyromegaly present.  Cardiovascular: Normal rate, regular rhythm, normal heart sounds and intact distal pulses. Exam reveals no gallop and no friction rub.  No murmur heard. Pulmonary/Chest: Effort normal and breath sounds normal. No stridor. No respiratory distress. He has no wheezes. He has no rales. He exhibits no tenderness.  Abdominal: Soft. Bowel sounds are normal. He exhibits no distension and no mass. There is no tenderness. There is no rebound and no guarding.  Musculoskeletal: Normal range of motion. He exhibits no edema, tenderness or deformity.  Lymphadenopathy:    He has no cervical adenopathy.  Neurological: He is alert and oriented to person, place, and time. He has normal reflexes. He displays normal reflexes. No cranial nerve deficit. He exhibits normal muscle tone. Coordination normal.  Skin: Skin is warm and dry. No rash noted. He is not diaphoretic. No erythema. No pallor.  Psychiatric: He has a normal mood and affect. His behavior is normal. Judgment and thought content normal.  Nursing note and vitals reviewed.     Assessment & Plan:  1. Routine general medical examination at a health care facility - Continue to work on diet and exercise  - Follow up in on year for next CPE or sooner if needed -  Basic metabolic panel - CBC with Differential/Platelet - Hepatic function panel - Lipid panel - TSH  2. Essential hypertension - Well controlled. No change in medication  - Basic metabolic panel - CBC with Differential/Platelet - Hepatic function panel - Lipid panel - TSH  3. Gastroesophageal reflux disease, esophagitis presence not specified - Continue with current medication regimen   4. Medication refill  - nortriptyline (PAMELOR) 75 MG capsule; Take 1 capsule (75 mg total) by mouth at bedtime.  Dispense: 90 capsule; Refill: 1  5. Acute non-recurrent pansinusitis  - cefdinir (OMNICEF) 300 MG capsule; Take 1 capsule (300 mg total) by mouth 2 (two) times daily.  Dispense: 14 capsule; Refill: 0 - Follow up if no improvement  6. Acute otitis media, unspecified otitis media type - cefdinir (OMNICEF) 300 MG capsule; Take 1 capsule (300 mg total) by mouth 2 (two) times daily.  Dispense: 14 capsule; Refill: 0  7. Chronic narcotic use  - traMADol (ULTRAM) 50 MG tablet; Take 1 tablet (50 mg total) by mouth every 8 (eight) hours as needed.  Dispense: 30 tablet; Refill: 0  8. Tobacco use disorder - varenicline (CHANTIX STARTING MONTH PAK) 0.5 MG X 11 & 1 MG X 42 tablet; Take one 0.5 mg tablet by mouth once daily for 3 days, then increase to one 0.5 mg tablet twice daily for 4 days, then increase to one 1 mg tablet twice daily.  Dispense: 53 tablet; Refill: 0 Trial of chantix. Common side effects including rare risk of suicide ideation was discussed  with the patient today.  Patient is instructed to go directly to the ED if this occurs.  We discussed that patient can continue to smoke for 3 week after starting chantix, but then must discontinue cigarettes.  He is also instructed to contact us prior to completion of the starter month pack for an rx for the continuation month pack.  5 minutes spent with patient today on tobacco cessation counseling.   Shirline Frees, NP

## 2017-06-24 ENCOUNTER — Telehealth: Payer: Self-pay | Admitting: Adult Health

## 2017-06-24 NOTE — Telephone Encounter (Signed)
Copied from CRM [phone]. Topic: Quick Communication - See Telephone Encounter >> Jun 24, 2017 10:57 AM Trula SladeWalter, Linda F wrote: CRM for notification. See Telephone encounter for: 06/24/17. Patient would like the results of his recent labs

## 2017-06-24 NOTE — Telephone Encounter (Signed)
Pt notified of results by telephone.  No further action required.

## 2017-06-24 NOTE — Telephone Encounter (Signed)
Labs look stable. There is one liver enzyme that is slightly high and this is likely from his diet. We will continue to monitor this

## 2017-06-26 IMAGING — US US SCROTUM
1 series · 14 of 25 positions shown · non-contrast
Comparison: None.

CLINICAL DATA: LEFT testicular pain for 2 days. Status post
vasectomy.

EXAM:
SCROTAL ULTRASOUND
DOPPLER ULTRASOUND OF THE TESTICLES
TECHNIQUE: Complete ultrasound examination of the testicles, epididymis, and
other scrotal structures was performed. Color and spectral Doppler
ultrasound were also utilized to evaluate blood flow to the
testicles.

[Series 1: us scrotum · 0.07mm/px · 14 of 65 slices shown]
[im 1/65]
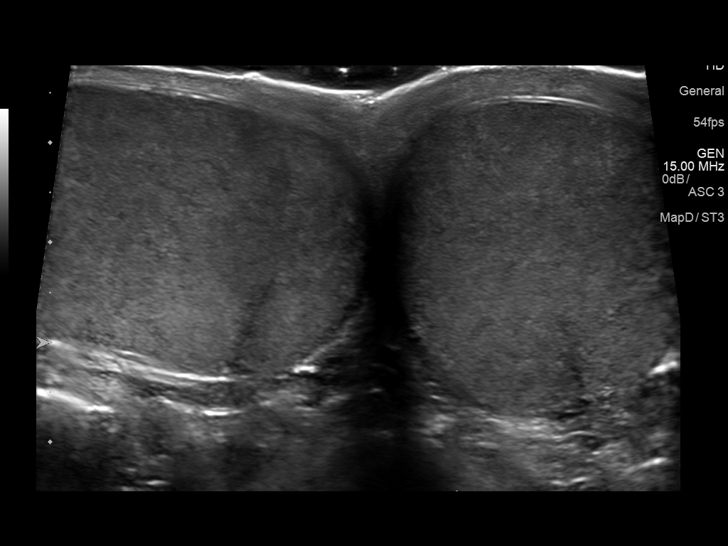
[im 6/65]
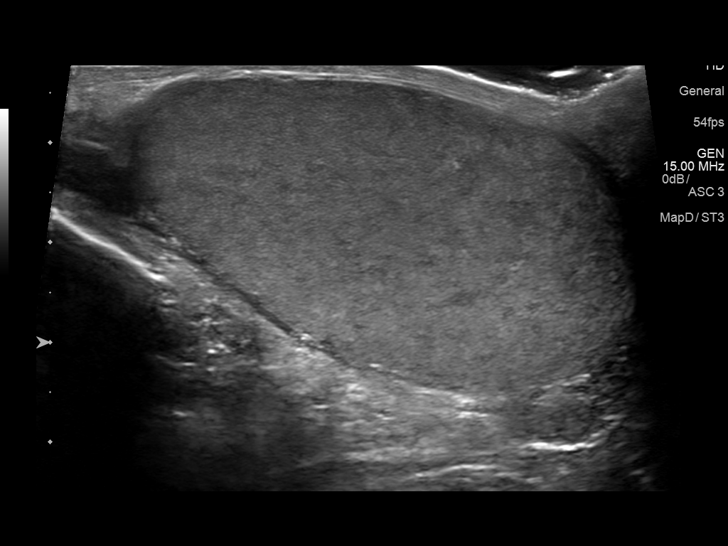
[im 11/65]
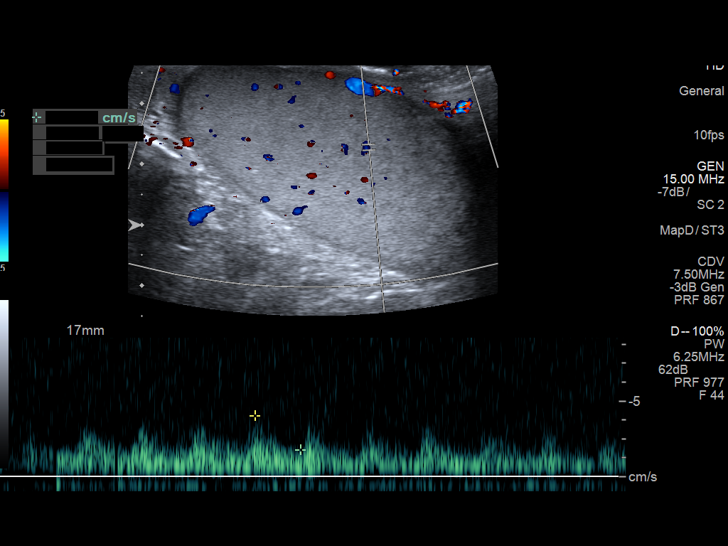
[im 17/65]
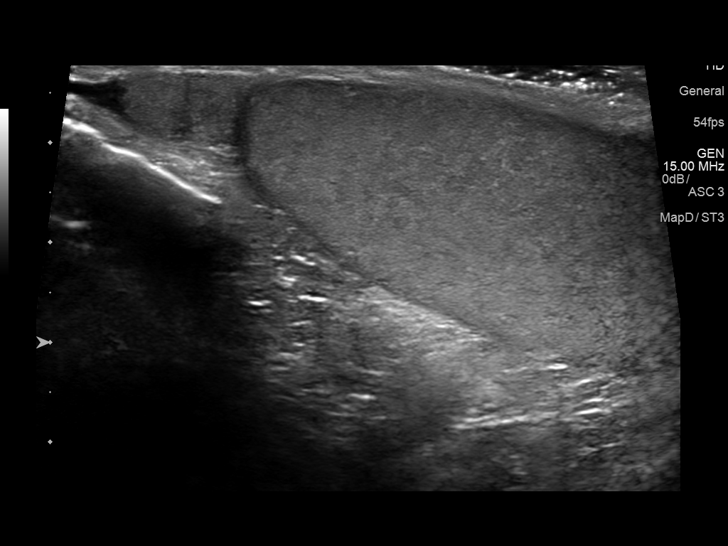
[im 22/65]
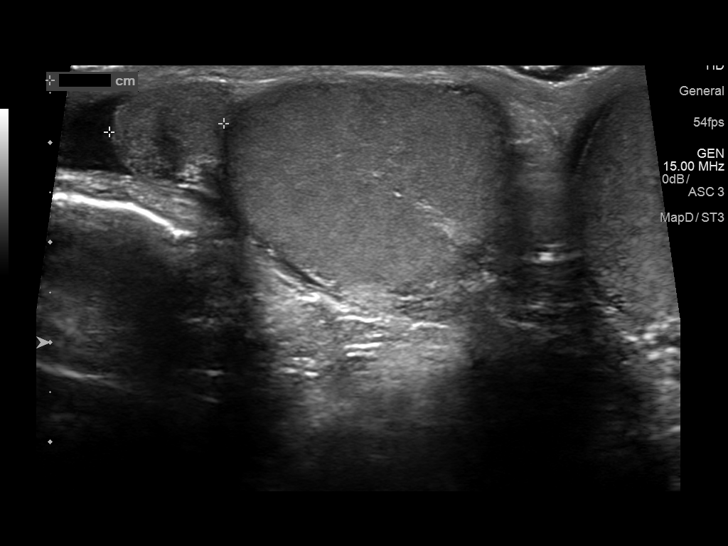
[im 25/65]
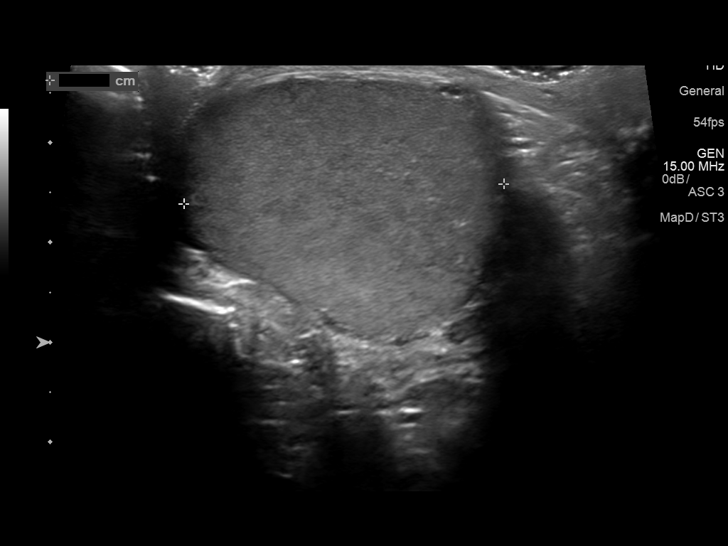
[im 30/65]
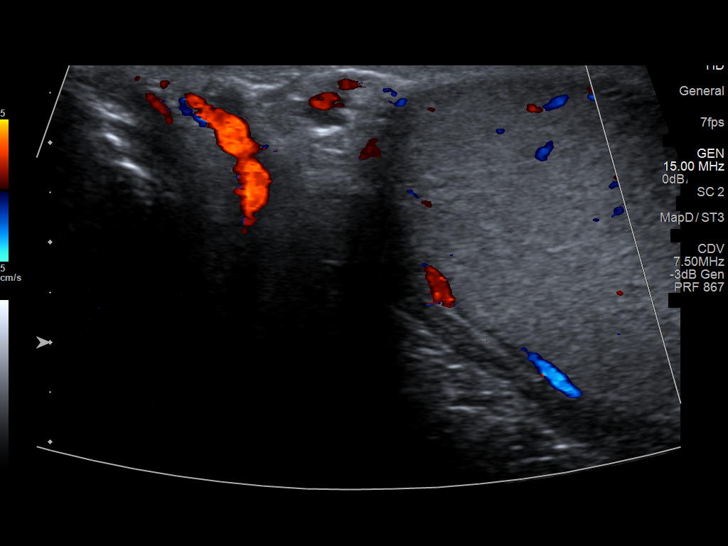
[im 35/65]
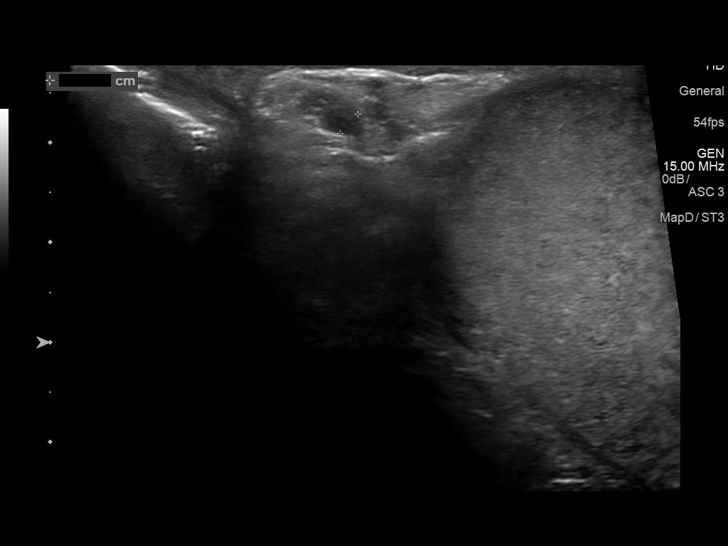
[im 41/65]
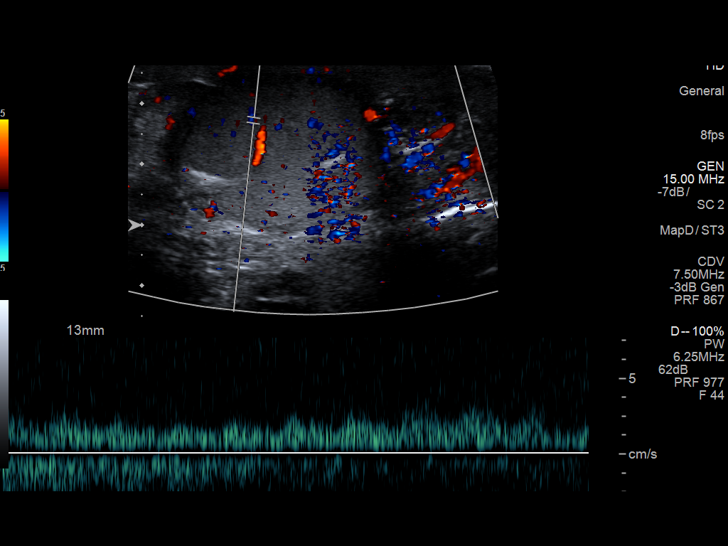
[im 43/65]
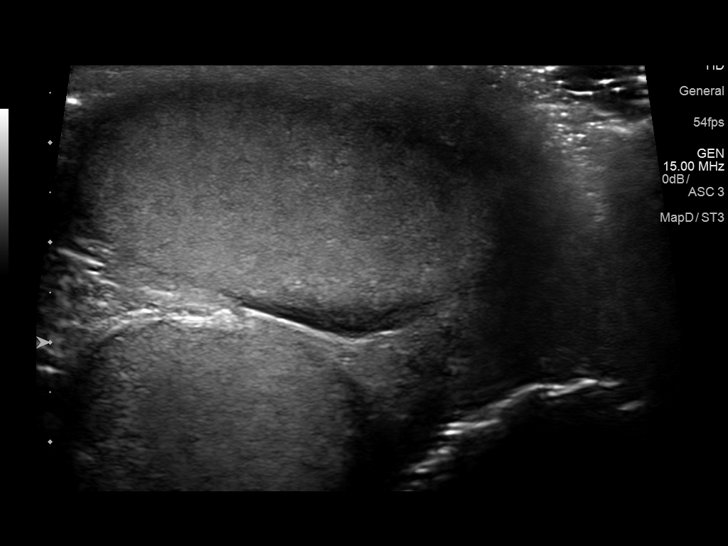
[im 49/65]
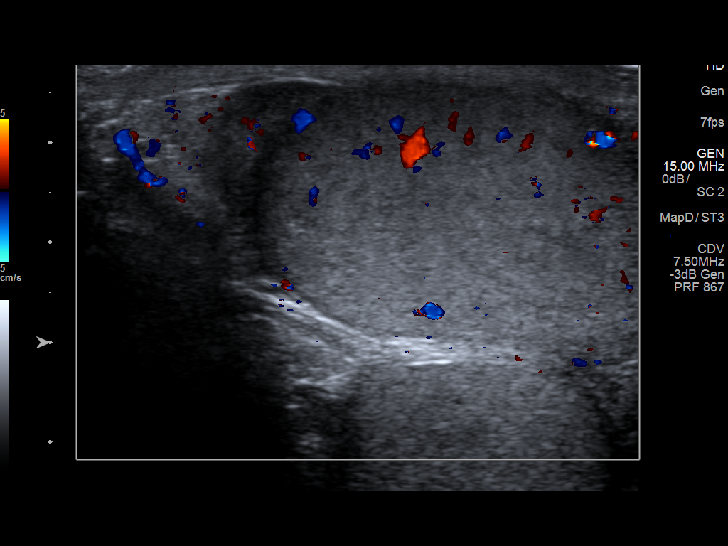
[im 54/65]
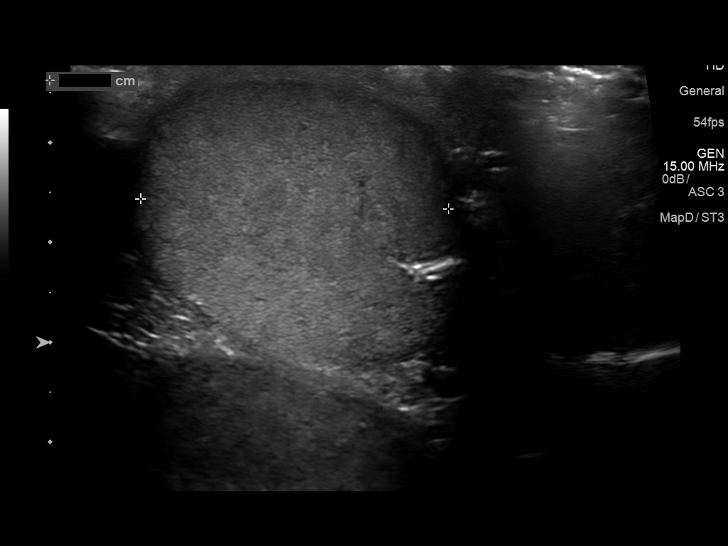
[im 59/65]
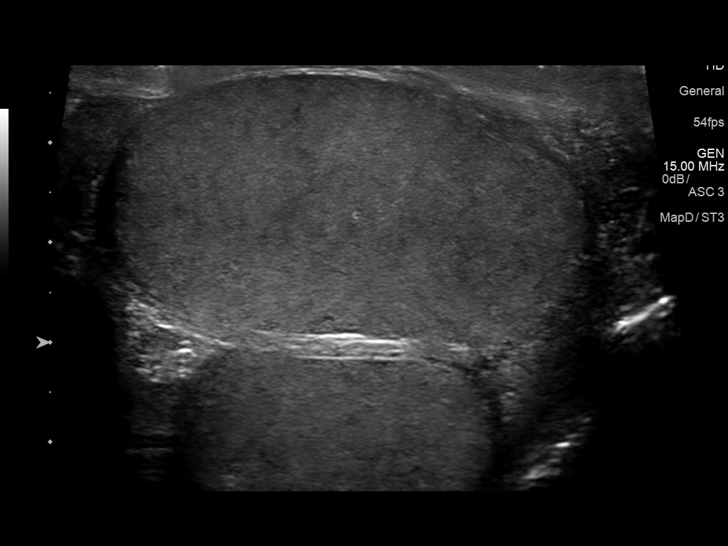
[im 65/65]
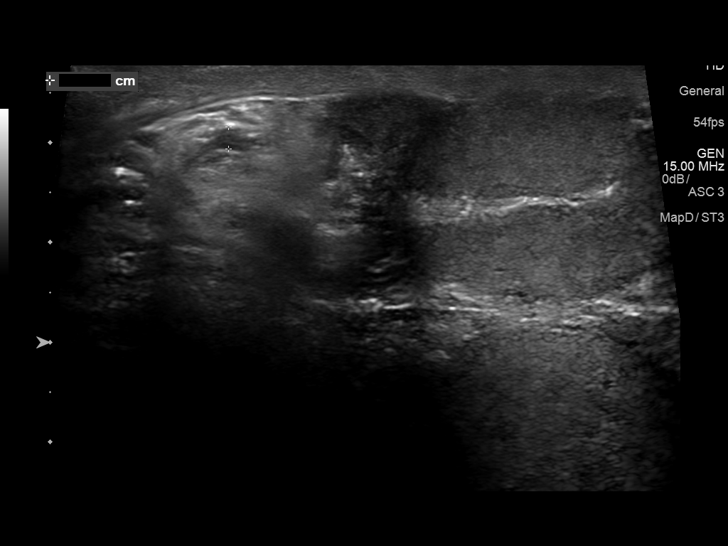

[14 of 25 positions shown; findings below may reference images not displayed]

FINDINGS: Right testicle

Measurements: 5.1 x 2.8 x 3.2 cm. No mass or microlithiasis
visualized.

Left testicle

Measurements: 5 x 2.8 x 3.1 cm. No mass or microlithiasis
visualized.

Right epididymis:  Normal in size and appearance.

Left epididymis:  Normal in size and appearance.

Hydrocele:  None visualized.

Varicocele:  None visualized.

Pulsed Doppler interrogation of both testes demonstrates normal low
resistance arterial and venous waveforms bilaterally.
IMPRESSION: Normal scrotal ultrasound.

## 2017-06-30 ENCOUNTER — Telehealth: Payer: Self-pay | Admitting: Adult Health

## 2017-06-30 NOTE — Telephone Encounter (Signed)
Copied from CRM [phone]. Topic: Quick Communication - See Telephone Encounter >> Jun 30, 2017  4:09 PM Clack, Princella PellegriniJessica D wrote: CRM for notification. See Telephone encounter for: 06/30/17.  Pt states there is a safety alert on his dexilant for stomach pain and diarrhea.  [facility] [place] - [address] [phone] (Phone) [phone] (Fax)

## 2017-07-01 ENCOUNTER — Emergency Department (HOSPITAL_COMMUNITY)
Admission: EM | Admit: 2017-07-01 | Discharge: 2017-07-01 | Disposition: A | Discharge status: Home / Self Care | Payer: No Typology Code available for payment source | Attending: Emergency Medicine | Admitting: Emergency Medicine

## 2017-07-01 ENCOUNTER — Ambulatory Visit (INDEPENDENT_AMBULATORY_CARE_PROVIDER_SITE_OTHER): Payer: No Typology Code available for payment source | Admitting: Family Medicine

## 2017-07-01 ENCOUNTER — Encounter: Payer: Self-pay | Admitting: Physician Assistant

## 2017-07-01 ENCOUNTER — Encounter: Payer: Self-pay | Admitting: Family Medicine

## 2017-07-01 ENCOUNTER — Emergency Department (HOSPITAL_COMMUNITY): Payer: No Typology Code available for payment source

## 2017-07-01 ENCOUNTER — Ambulatory Visit: Payer: Self-pay | Admitting: *Deleted

## 2017-07-01 ENCOUNTER — Encounter (HOSPITAL_COMMUNITY): Payer: Self-pay | Admitting: Emergency Medicine

## 2017-07-01 VITALS — BP 138/82 | HR 63 | Temp 97.9°F | Wt 216.2 lb

## 2017-07-01 DIAGNOSIS — Z79899 Other long term (current) drug therapy: Secondary | ICD-10-CM | POA: Insufficient documentation

## 2017-07-01 DIAGNOSIS — R109 Unspecified abdominal pain: Secondary | ICD-10-CM

## 2017-07-01 DIAGNOSIS — R1033 Periumbilical pain: Secondary | ICD-10-CM | POA: Diagnosis not present

## 2017-07-01 DIAGNOSIS — R112 Nausea with vomiting, unspecified: Secondary | ICD-10-CM | POA: Diagnosis not present

## 2017-07-01 DIAGNOSIS — F17210 Nicotine dependence, cigarettes, uncomplicated: Secondary | ICD-10-CM | POA: Insufficient documentation

## 2017-07-01 DIAGNOSIS — R1032 Left lower quadrant pain: Secondary | ICD-10-CM | POA: Diagnosis not present

## 2017-07-01 LAB — COMPREHENSIVE METABOLIC PANEL
ALK PHOS: 87 U/L (ref 38–126)
ALT: 48 U/L (ref 17–63)
ANION GAP: 13 (ref 5–15)
AST: 26 U/L (ref 15–41)
Albumin: 4.7 g/dL (ref 3.5–5.0)
BILIRUBIN TOTAL: 0.6 mg/dL (ref 0.3–1.2)
BUN: 14 mg/dL (ref 6–20)
CALCIUM: 9.7 mg/dL (ref 8.9–10.3)
CO2: 21 mmol/L — AB (ref 22–32)
CREATININE: 0.98 mg/dL (ref 0.61–1.24)
Chloride: 105 mmol/L (ref 101–111)
GFR calc Af Amer: >60 mL/min (ref >60)
GFR calc non Af Amer: >60 mL/min (ref >60)
GLUCOSE: 99 mg/dL (ref 65–99)
Potassium: 4 mmol/L (ref 3.5–5.1)
SODIUM: 139 mmol/L (ref 135–145)
TOTAL PROTEIN: 8.1 g/dL (ref 6.5–8.1)

## 2017-07-01 LAB — CBC
HCT: 48.9 % (ref 39.0–52.0)
Hemoglobin: 16.9 g/dL (ref 13.0–17.0)
MCH: 31.6 pg (ref 26.0–34.0)
MCHC: 34.6 g/dL (ref 30.0–36.0)
MCV: 91.6 fL (ref 78.0–100.0)
PLATELETS: 308 10*3/uL (ref 150–400)
RBC: 5.34 MIL/uL (ref 4.22–5.81)
RDW: 12.5 % (ref 11.5–15.5)
WBC: 11.8 10*3/uL — ABNORMAL HIGH (ref 4.0–10.5)

## 2017-07-01 LAB — URINALYSIS, ROUTINE W REFLEX MICROSCOPIC
BILIRUBIN URINE: NEGATIVE
Glucose, UA: NEGATIVE mg/dL
HGB URINE DIPSTICK: NEGATIVE
KETONES UR: NEGATIVE mg/dL
Leukocytes, UA: NEGATIVE
Nitrite: NEGATIVE
PROTEIN: NEGATIVE mg/dL
Specific Gravity, Urine: 1.002 — ABNORMAL LOW (ref 1.005–1.030)
pH: 7 (ref 5.0–8.0)

## 2017-07-01 LAB — LIPASE, BLOOD: Lipase: 30 U/L (ref 11–51)

## 2017-07-01 IMAGING — CT CT ABD-PELV W/ CM
2 of 4 series · 16 of 46 positions shown, 18 images · IV contrast (ISOVUE)
Comparison: [DATE]

CLINICAL DATA: Abdominal pain with nausea, vomiting and diarrhea
since yesterday.

EXAM:
CT ABDOMEN AND PELVIS WITH CONTRAST
TECHNIQUE: Multidetector CT imaging of the abdomen and pelvis was performed
using the standard protocol following bolus administration of
intravenous contrast.
CONTRAST:  100mL [8C] IOPAMIDOL ([8C]) INJECTION 61%

[Series 2: axial st · axial · 0.70mm/px · z∈[+996,+1446]mm · 13 of 102 slices shown, 15 images]
[im 6/102  soft-tissue]
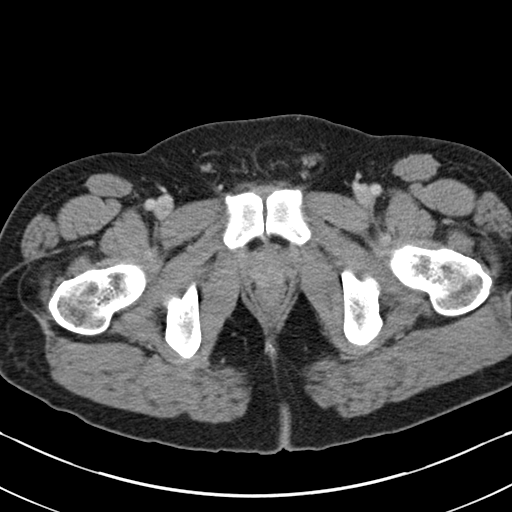
[im 6/102  bone]
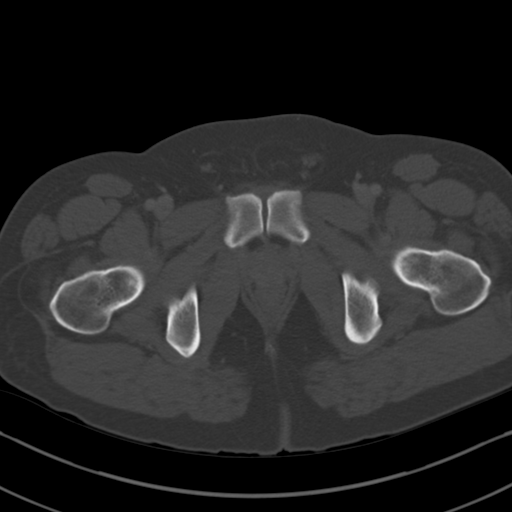
[im 16/102  soft-tissue]
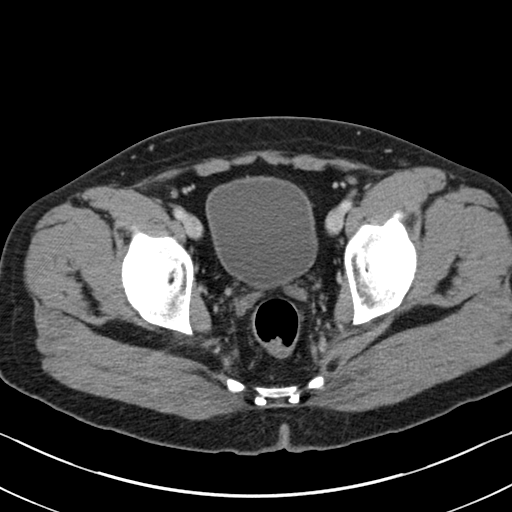
[im 21/102  soft-tissue]
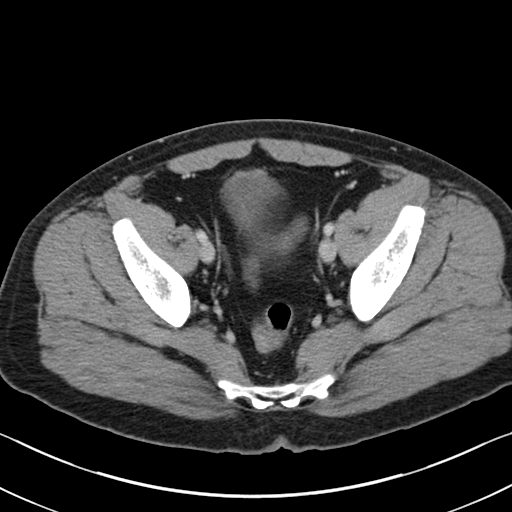
[im 31/102  soft-tissue]
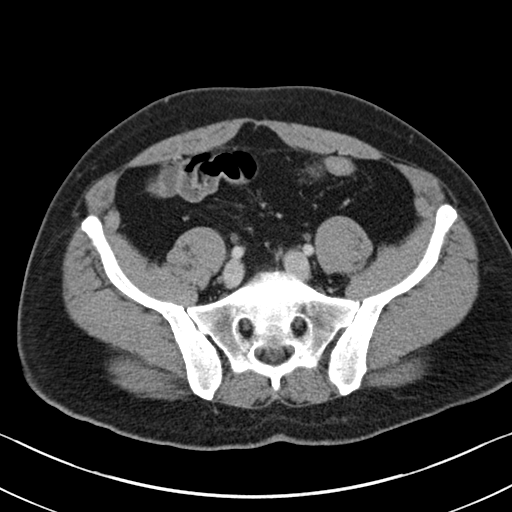
[im 36/102  soft-tissue]
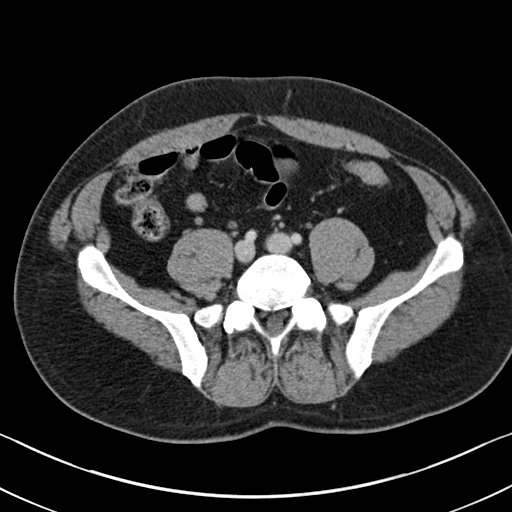
[im 46/102  soft-tissue]
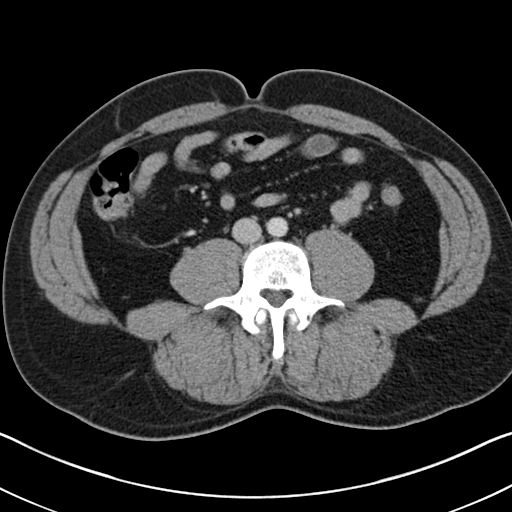
[im 51/102  soft-tissue]
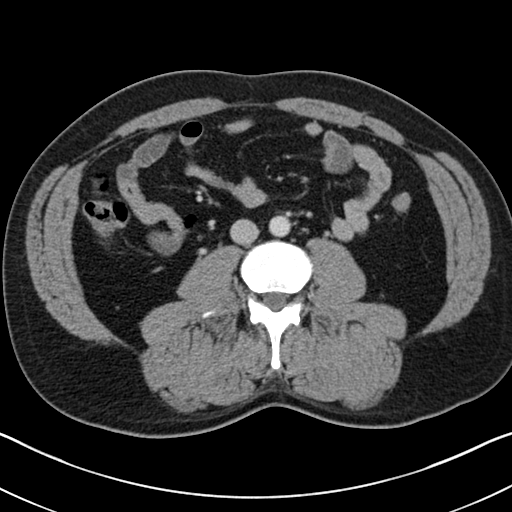
[im 56/102  soft-tissue]
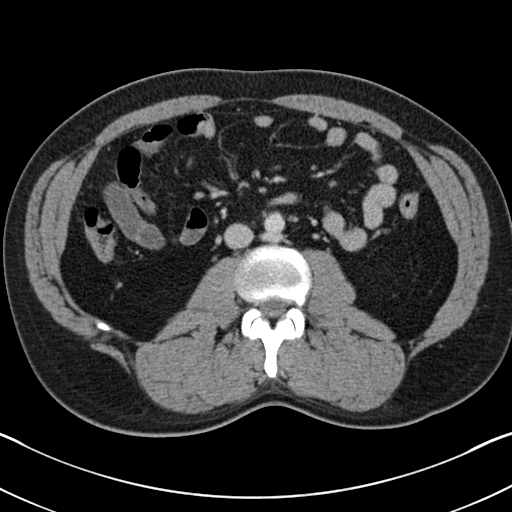
[im 66/102  soft-tissue]
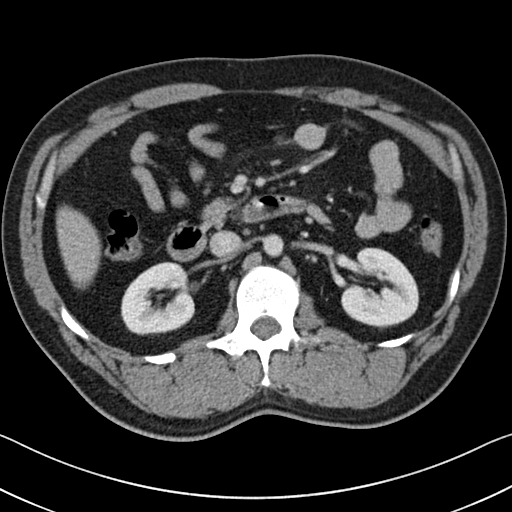
[im 66/102  bone]
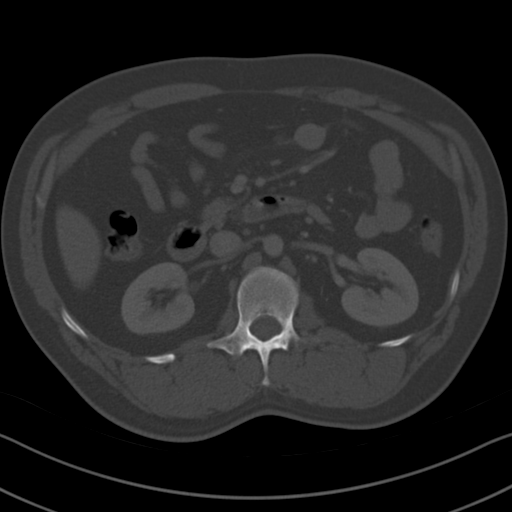
[im 71/102  soft-tissue]
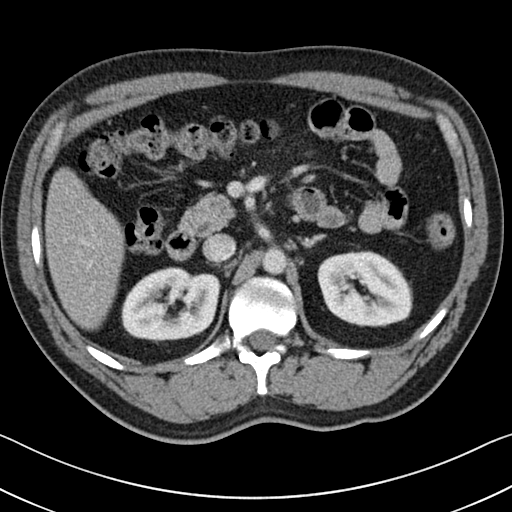
[im 81/102  soft-tissue]
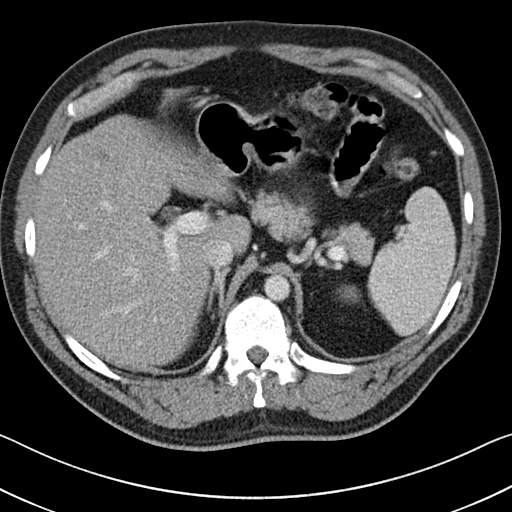
[im 86/102  soft-tissue]
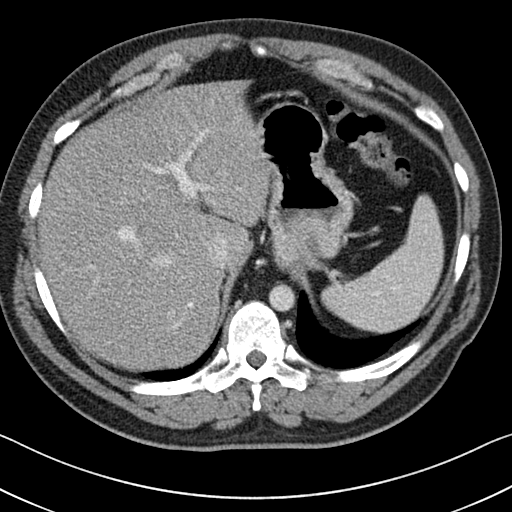
[im 96/102  soft-tissue]
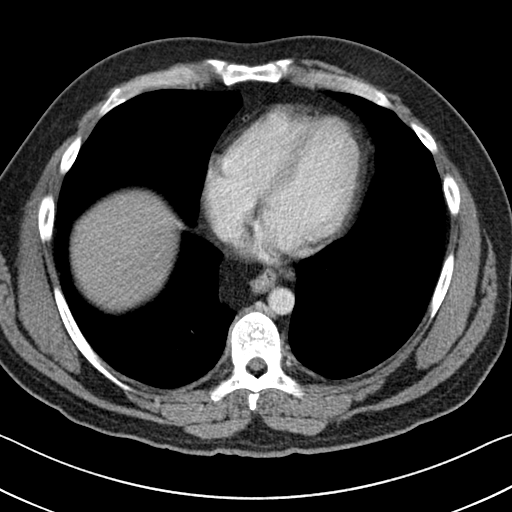

[Series 5: coronal st · coronal · 0.84mm/px · 3 of 105 slices shown]
[im 35/105  soft-tissue]
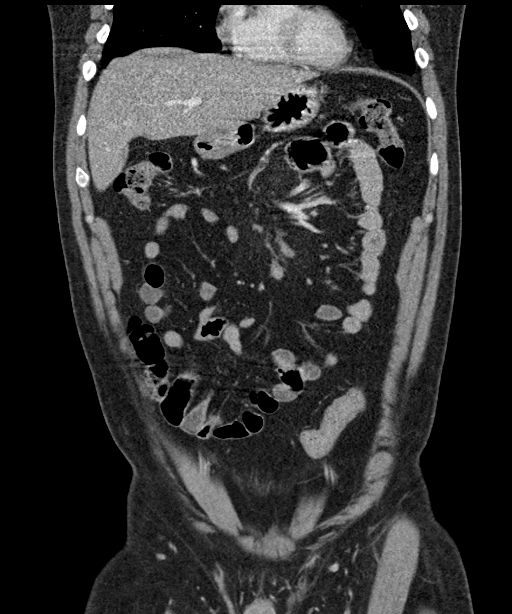
[im 47/105  soft-tissue]
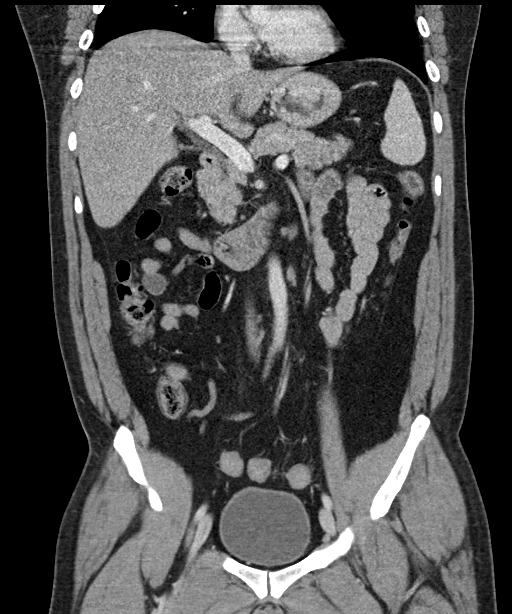
[im 58/105  soft-tissue]
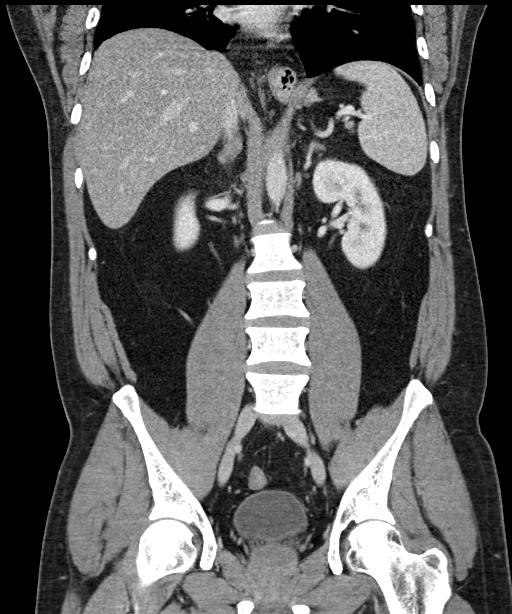

[16 of 46 positions shown; findings below may reference images not displayed]

FINDINGS: LOWER CHEST: Minimal atelectasis at the right lung base. No effusion
or pneumothorax.. Included heart size is normal. No pericardial
effusion.

HEPATOBILIARY: Mild hepatic steatosis without space-occupying mass.
No biliary dilatation. Cholecystectomy without choledocholithiasis.

PANCREAS: Normal.

SPLEEN: Normal.

ADRENALS/URINARY TRACT: Kidneys are orthotopic, demonstrating
symmetric enhancement. No nephrolithiasis, hydronephrosis or solid
renal masses. Urinary bladder is partially distended and
unremarkable. Normal adrenal glands.

STOMACH/BOWEL: The stomach, small and large bowel are normal in
course and caliber without inflammatory changes. Normal appendix.
Minimal colonic diverticulosis along the sigmoid colon without acute
diverticulitis.

VASCULAR/LYMPHATIC: Aortoiliac vessels are normal in course and
caliber. No lymphadenopathy by CT size criteria.

REPRODUCTIVE: Normal prostate and seminal vesicles.

OTHER: Tiny fat containing umbilical hernia. No free air nor free
fluid.

MUSCULOSKELETAL: Small left acetabular bone island. No acute osseous
appearing abnormality.
IMPRESSION: 1. Minimal sigmoid diverticulosis without acute diverticulitis.
2. Mild hepatic steatosis.  Status post cholecystectomy.

## 2017-07-01 MED ORDER — SODIUM CHLORIDE 0.9 % IV BOLUS
1000.0000 mL | Freq: Once | INTRAVENOUS | Status: AC
  Administered 2017-07-01: 1000 mL via INTRAVENOUS

## 2017-07-01 MED ORDER — HYDROCODONE-ACETAMINOPHEN 5-325 MG PO TABS: 1.0000 | ORAL_TABLET | ORAL | 0 refills | Status: DC | PRN

## 2017-07-01 MED ORDER — IOPAMIDOL (ISOVUE-300) INJECTION 61%
INTRAVENOUS | Status: AC
  Filled 2017-07-01: qty 100

## 2017-07-01 MED ORDER — IOPAMIDOL (ISOVUE-300) INJECTION 61%
100.0000 mL | Freq: Once | INTRAVENOUS | Status: AC | PRN
  Administered 2017-07-01: 100 mL via INTRAVENOUS

## 2017-07-01 MED ORDER — ONDANSETRON HCL 4 MG/2ML IJ SOLN
4.0000 mg | Freq: Once | INTRAMUSCULAR | Status: AC
  Administered 2017-07-01: 4 mg via INTRAVENOUS
  Filled 2017-07-01: qty 2

## 2017-07-01 MED ORDER — SODIUM CHLORIDE 0.9 % IJ SOLN
INTRAMUSCULAR | Status: AC
  Filled 2017-07-01: qty 50

## 2017-07-01 MED ORDER — HYDROMORPHONE HCL 1 MG/ML IJ SOLN
0.5000 mg | Freq: Once | INTRAMUSCULAR | Status: AC
  Administered 2017-07-01: 0.5 mg via INTRAVENOUS
  Filled 2017-07-01: qty 1

## 2017-07-01 MED ORDER — ONDANSETRON 4 MG PO TBDP: 4.0000 mg | ORAL_TABLET | Freq: Three times a day (TID) | ORAL | 0 refills | Status: DC | PRN

## 2017-07-01 NOTE — Telephone Encounter (Signed)
I do not understand this message.

## 2017-07-01 NOTE — ED Provider Notes (Signed)
[facility]-EMERGENCY DEPT Provider Note   CSN: [id number] Arrival date & time: 07/01/17  1606     History   Chief Complaint Chief Complaint  Patient presents with  . Abdominal Pain  . Nausea  . Emesis  . Diarrhea    HPI Shane Cole. is a 37 y.o. male who presents with left-sided abdominal pain.  Past medical history significant for anxiety, depression, PUD, GERD.  Patient states that he had acute onset of left-sided abdominal pain after eating last night.  Every time he would take a bite of food would cause a sharp stinging pain in the left side of his abdomen.  This morning he noticed that he would become hot and have sweats and nausea.  He went to [employer] and was unable to finish his food due to worsening pain.  The pain feels like a "hot knife". He then took his child to the doctor's today and they noted that he did not appear well and advised him to follow-up with his doctor as soon as possible.  He went to his doctor's office and they were concerned for possible pancreatitis versus appendicitis and advised him to come to the emergency department.  The diarrhea has been ongoing for several days however the nausea and vomiting started today.  He denies fever, chest pain, shortness of breath, bloody stools, urinary symptoms, penis or testicular pain. Past surgical history significant for cholecystectomy.  HPI  Past Medical History:  Diagnosis Date  . Allergy   . Anxiety   . Bipolar disorder (HCC)   . Bronchitis   . Crush injury lower leg    Left lower leg  . Depression   . Gastric ulcer   . GERD (gastroesophageal reflux disease)    will awaken him in night  . Migraine   . Nerve pain   . RSD (reflex sympathetic dystrophy)     Patient Active Problem List   Diagnosis Date Noted  . GERD (gastroesophageal reflux disease) 07/16/2016  . Internal hemorrhoid 07/16/2016  . Abdominal pain, chronic, epigastric 07/16/2016  . Rectal bleeding  07/16/2016  . Essential hypertension 02/26/2016  . Adhesive capsulitis of right shoulder 07/16/2015  . Thoracic outlet syndrome 02/12/2015  . Chronic narcotic use 10/26/2014  . Opioid contract exists 10/26/2014  . RSD lower limb 06/30/2014  . Tobacco use disorder 05/11/2014  . Chronic diarrhea 03/29/2012  . Bipolar disorder (HCC) 03/29/2012    Past Surgical History:  Procedure Laterality Date  . CHOLECYSTECTOMY N/A 01/24/2014   Procedure: LAPAROSCOPIC CHOLECYSTECTOMY;  Surgeon: Axel Filler, MD;  Location: [facility] OR;  Service: General;  Laterality: N/A;  . COLONOSCOPY    . HAND SURGERY    . MASS EXCISION Left 01/25/2013   Procedure: EXCISION MASS DORSAL ASPECT LEFT LONG FINGER DISTAL INTERPHALANGEAL JOINT;  Surgeon: Wyn Forster., MD;  Location: [facility];  Service: Orthopedics;  Laterality: Left;  Left long   . MOUTH SURGERY          Home Medications    Prior to Admission medications   Medication Sig Start Date End Date Taking? Authorizing Provider  clonazePAM (KLONOPIN) 1 MG tablet Take 1 tablet by mouth at bedtime. 01/06/17   [provider]  fluticasone (FLONASE) 50 MCG/ACT nasal spray Place 2 sprays into both nostrils daily. 06/08/17   Withrow, Everardo All, FNP  gabapentin (NEURONTIN) 300 MG capsule Take 1 capsule (300 mg total) by mouth 3 (three) times daily. 02/26/16   Nafziger, Kandee Keen,  NP  lidocaine (LINDAMANTLE) 3 % CREA cream  12/11/16   [provider]  Multiple Vitamin (MULTIVITAMIN WITH MINERALS) TABS tablet Take 1 tablet by mouth daily.    [provider]  nortriptyline (PAMELOR) 75 MG capsule Take 1 capsule (75 mg total) by mouth at bedtime. 06/17/17   Nafziger, Kandee Keenory, NP  ondansetron (ZOFRAN-ODT) 4 MG disintegrating tablet DISSOLVE 1 TABLET ON TONGUE EVERY 8 HOURS AS NEEDED FOR NAUSEA AND VOMITING 05/19/17   Rachael FeeJacobs, Daniel P, MD  oxyCODONE-acetaminophen (PERCOCET) 10-325 MG tablet Take 1 tablet by mouth 3 (three) times daily.  05/07/17   [provider]  ranitidine (ZANTAC) 150 MG tablet Take 1 tablet (150 mg total) by mouth at bedtime. Patient taking differently: Take 150 mg by mouth 3 (three) times daily as needed for heartburn.  12/30/13   Le, Thao P, DO  sildenafil (REVATIO) 20 MG tablet Take 1 tablet (20 mg total) by mouth 2 (two) times daily. 06/02/17 07/02/17  Nafziger, Kandee Keenory, NP  tiZANidine (ZANAFLEX) 4 MG tablet Take 1 tablet by mouth 3 (three) times daily as needed. 05/13/17   [provider]  traMADol (ULTRAM) 50 MG tablet Take 1 tablet (50 mg total) by mouth every 8 (eight) hours as needed. 06/17/17   Nafziger, Kandee Keenory, NP  varenicline (CHANTIX STARTING MONTH PAK) 0.5 MG X 11 & 1 MG X 42 tablet Take one 0.5 mg tablet by mouth once daily for 3 days, then increase to one 0.5 mg tablet twice daily for 4 days, then increase to one 1 mg tablet twice daily. 06/17/17   Shirline FreesNafziger, Cory, NP    Family History Family History  Problem Relation Age of Onset  . Hyperlipidemia Father   . Hypertension Father   . Anxiety disorder Father   . Drug abuse Father   . Cancer Paternal Grandfather        lung, colon  . Colon cancer Paternal Grandfather   . Lung cancer Paternal Grandfather   . Diabetes Paternal Grandmother   . Cancer Paternal Grandmother   . Cancer Maternal Grandmother   . Cancer Maternal Grandfather   . Lung cancer Maternal Grandfather   . Stroke Unknown   . Heart disease Unknown   . Depression Paternal Aunt   . Anxiety disorder Paternal Aunt   . Drug abuse Paternal Uncle     Social History Social History   Tobacco Use  . Smoking status: Current Every Day Smoker    Packs/day: 1.00    Years: 23.00    Pack years: 23.00    Types: Cigarettes, E-cigarettes  . Smokeless tobacco: Current User  Substance Use Topics  . Alcohol use: Yes    Alcohol/week: 3.6 oz    Types: 6 Cans of beer per week    Comment: occasional  . Drug use: No     Allergies   Aspirin; Codeine; Penicillins; and  Amitriptyline   Review of Systems Review of Systems  Constitutional: Positive for chills and diaphoresis. Negative for fever.  Respiratory: Negative for shortness of breath.   Cardiovascular: Negative for chest pain.  Gastrointestinal: Positive for abdominal pain, diarrhea, nausea and vomiting. Negative for blood in stool and constipation.  Genitourinary: Negative for difficulty urinating, flank pain, hematuria, penile pain and testicular pain.     Physical Exam Updated Vital Signs BP 134/86 (BP Location: Left Arm)   Pulse (!) 123   Temp 98.3 F (36.8 C) (Oral)   Resp 18   Ht 5' 8.5" (1.74 m)   Wt  98 kg (216 lb)   SpO2 98%   BMI 32.36 kg/m   Physical Exam  Constitutional: He is oriented to person, place, and time. He appears well-developed and well-nourished. No distress.  HENT:  Head: Normocephalic and atraumatic.  Eyes: Pupils are equal, round, and reactive to light. Conjunctivae are normal. Right eye exhibits no discharge. Left eye exhibits no discharge. No scleral icterus.  Neck: Normal range of motion.  Cardiovascular: Regular rhythm. Tachycardia present. Exam reveals no gallop and no friction rub.  No murmur heard. Pulmonary/Chest: Effort normal and breath sounds normal. No respiratory distress.  Abdominal: Soft. Bowel sounds are normal. He exhibits no distension and no mass. There is tenderness (Generalized tenderness.  The patient points to his left lower quadrant where his pain primarily is.). There is rebound (Mild). There is no guarding. No hernia.  Neurological: He is alert and oriented to person, place, and time.  Skin: Skin is warm and dry.  Psychiatric: He has a normal mood and affect. His behavior is normal.  Nursing note and vitals reviewed.    ED Treatments / Results  Labs (all labs ordered are listed, but only abnormal results are displayed) Labs Reviewed  COMPREHENSIVE METABOLIC PANEL - Abnormal; Notable for the following components:      Result  Value   CO2 21 (*)    All other components within normal limits  CBC - Abnormal; Notable for the following components:   WBC 11.8 (*)    All other components within normal limits  URINALYSIS, ROUTINE W REFLEX MICROSCOPIC - Abnormal; Notable for the following components:   Color, Urine COLORLESS (*)    Specific Gravity, Urine 1.002 (*)    All other components within normal limits  LIPASE, BLOOD    EKG None  Radiology Ct Abdomen Pelvis W Contrast  Result Date: 07/01/2017 CLINICAL DATA:  Abdominal pain with nausea, vomiting and diarrhea since yesterday. EXAM: CT ABDOMEN AND PELVIS WITH CONTRAST TECHNIQUE: Multidetector CT imaging of the abdomen and pelvis was performed using the standard protocol following bolus administration of intravenous contrast. CONTRAST:  ISOVUE-300 IOPAMIDOL (ISOVUE-300) INJECTION 61% COMPARISON:  11/26/2016 FINDINGS: LOWER CHEST: Minimal atelectasis at the right lung base. No effusion or pneumothorax. Included heart size is normal. No pericardial effusion. HEPATOBILIARY: Mild hepatic steatosis without space-occupying mass. No biliary dilatation. Cholecystectomy without choledocholithiasis. PANCREAS: Normal. SPLEEN: Normal. ADRENALS/URINARY TRACT: Kidneys are orthotopic, demonstrating symmetric enhancement. No nephrolithiasis, hydronephrosis or solid renal masses. Urinary bladder is partially distended and unremarkable. Normal adrenal glands. STOMACH/BOWEL: The stomach, small and large bowel are normal in course and caliber without inflammatory changes. Normal appendix. Minimal colonic diverticulosis along the sigmoid colon without acute diverticulitis. VASCULAR/LYMPHATIC: Aortoiliac vessels are normal in course and caliber. No lymphadenopathy by CT size criteria. REPRODUCTIVE: Normal prostate and seminal vesicles. OTHER: Tiny fat containing umbilical hernia. No free air nor free fluid. MUSCULOSKELETAL: Small left acetabular bone island. No acute osseous appearing  abnormality. IMPRESSION: 1. Minimal sigmoid diverticulosis without acute diverticulitis. 2. Mild hepatic steatosis.  Status post cholecystectomy. Electronically Signed   By: Tollie Eth M.D.   On: 07/01/2017 18:32    Procedures Procedures (including critical care time)  Medications Ordered in ED Medications  sodium chloride 0.9 % injection (has no administration in time range)  sodium chloride 0.9 % bolus 1,000 mL (0 mLs Intravenous Stopped 07/01/17 1839)  ondansetron (ZOFRAN) injection 4 mg (4 mg Intravenous Given 07/01/17 1736)  HYDROmorphone (DILAUDID) injection 0.5 mg (0.5 mg Intravenous Given 07/01/17 1737)  iopamidol (  ISOVUE-300) 61 % injection 100 mL (100 mLs Intravenous Contrast Given 07/01/17 1810)     Initial Impression / Assessment and Plan / ED Course  I have reviewed the triage vital signs and the nursing notes.  Pertinent labs & imaging results that were available during my care of the patient were reviewed by me and considered in my medical decision making (see chart for details).  37 year old male presents with abdominal pain.  He is tachycardic on arrival but otherwise vital signs are normal.  On exam he appears uncomfortable.  He has generalized tenderness on exam however points to his left lower quadrant where his pain is.  CBC is remarkable for mild leukocytosis.  CMP and lipase are normal.  Urine is normal.  CT abdomen pelvis was ordered.  CT is unremarkable.  Discussed results with the patient and his wife.  Will discharge with a small amount of pain medicine and antiemetics.  He is working on getting an appointment with GI for his chronic nausea.  Final Clinical Impressions(s) / ED Diagnoses   Final diagnoses:  Abdominal pain, unspecified abdominal location  Nausea and vomiting, intractability of vomiting not specified, unspecified vomiting type    ED Discharge Orders    None       Bethel Born, PA-C 07/01/17 2025    Linwood Dibbles, MD 07/01/17 2350

## 2017-07-01 NOTE — Telephone Encounter (Signed)
Spoke to the pt. He complains of a sharp stabbing pain to the left of his belly button.  He has diarrhea and nausea.  He is sweating but no fever that he is aware of.  Ongoing for a few days. He remembered getting a letter in the mail from the pharmacy stating the Dexilant is causing stomach pain and diarrhea.  Not sure if the two are related.  He has been on the Dexilant for 2-3 months.  Has an appt today to see Dr. Clent RidgesFry at 3 PM.  Will forward as an FYI.

## 2017-07-01 NOTE — Progress Notes (Signed)
   Subjective:    Patient ID: Shane LandryPaul D Dondlinger Jr., male    DOB: 12/02/1980, 37 y.o.   MRN: [id number]  HPI Here for the onset last night of severe pains in the abdomen along with nausea and vomiting. No fever. He had some diarrhea last night but has not had a BM today. The pains get worse every time he tries to eat some food. He is sipping fluids. The pains are centered around the central abdomen and they radiate to the back. He had a normal colonoscopy in December 2018. An upper endoscopy at that time showed esophagitis and gastritis. He takes Dexilant in the mornings and Zantac in the evenings. He is S/P a cholecystectomy.    Review of Systems  Constitutional: Positive for diaphoresis. Negative for chills and fever.  Respiratory: Negative.   Cardiovascular: Negative.   Gastrointestinal: Positive for abdominal pain, nausea and vomiting. Negative for abdominal distention, anal bleeding, blood in stool, constipation and diarrhea.  Genitourinary: Negative.        Objective:   Physical Exam  Constitutional: He appears well-developed and well-nourished.  In obvious pain   Neck: No thyromegaly present.  Cardiovascular: Normal rate, regular rhythm, normal heart sounds and intact distal pulses.  Pulmonary/Chest: Effort normal and breath sounds normal. No respiratory distress. He has no wheezes. He has no rales.  Abdominal: Soft. Bowel sounds are normal. He exhibits no distension and no mass. There is no guarding.  Very tender around the umbilicus with rebound present   Lymphadenopathy:    He has no cervical adenopathy.          Assessment & Plan:  This is worrisome for possible appendicitis or pancreatitis. [facility]. He is safe to drive himself there.  Gershon CraneStephen Kurtis Anastasia, MD

## 2017-07-01 NOTE — ED Notes (Signed)
Patient transported to CT 

## 2017-07-01 NOTE — ED Triage Notes (Signed)
Pt c/o abd pain with n/v/d since yesterday. Hx hernia, ulcers, gallbladder.

## 2017-07-01 NOTE — Telephone Encounter (Signed)
Patient phoned in with left-sided upper abdominal pain that started yesterday evening after eating sesame chicken. He stated he has sharp, burning intermittent pain that accompanies the dull, nauseating, hungry-feeling constant abdomen pain. He does have radiating pain to the back along with sweating with the intermittent sharp pains, today.  Positive for loose stools for several days. No fever he is aware of. He did take a zofran that helped the nausea this morning. [facility]CP is [facility]. Scheduled afternoon appointment with Dr. Clent RidgesFry.  Reason for Disposition . [1] MODERATE pain (e.g., interferes with normal activities) AND [2] comes and goes (cramps) AND [3] present > 24 hours  (Exception: pain with Vomiting or Diarrhea - see that Guideline)  Answer Assessment - Initial Assessment Questions 1. LOCATION: "Where does it hurt?"      Upper abdomen on left side. 2. RADIATION: "Does the pain shoot anywhere else?" (e.g., chest, back)     Yes to the back 3. ONSET: "When did the pain begin?" (e.g., minutes, hours or days ago)     Last night 4. SUDDEN: "Gradual or sudden onset?"  sudden onset last night. 5. PATTERN "Does the pain come and go, or is it constant?"    - If constant: "Is it getting better, staying the same, or worsening?"      (Note: Constant means the pain never goes away completely; most serious pain is constant and it progresses)     - If intermittent: "How long does it last?" "Do you have pain now?"     (Note: Intermittent means the pain goes away completely between bouts)  Dull pain constant but the sharp hot stabbing pain is intermittent.  6. SEVERITY: "How bad is the pain?"  (e.g., Scale 1-10; mild, moderate, or severe)    - MILD (1-3): doesn't interfere with normal activities, abdomen soft and not tender to touch     - MODERATE (4-7): interferes with normal activities or awakens from sleep, tender to touch     - SEVERE (8-10): excruciating pain, doubled over, unable to do any normal  activities      Starts out an 8 then eases gradually through the sharp pain 7. RECURRENT SYMPTOM: "Have you ever had this type of abdominal pain before?" If so, ask: "When was the last time?" and "What happened that time?"      no 8. AGGRAVATING FACTORS: "Does anything seem to cause this pain?" (e.g., foods, stress, alcohol)   Not really.Just sitting right now  9. CARDIAC SYMPTOMS: "Do you have any of the following symptoms: chest pain, difficulty breathing, sweating, nausea?"     Cold sweating with the sharp pain. Nausea since waking up this morning. 10. OTHER SYMPTOMS: "Do you have any other symptoms?" (e.g., fever, vomiting, diarrhea)      Stools half diarrhea, different than normal, for 3 days not. Stomach feels Guineahungary and gassy but appetite is low. 11. PREGNANCY: "Is there any chance you are pregnant?" "When was your last menstrual period?"     na  Protocols used: ABDOMINAL PAIN - UPPER-A-AH

## 2017-07-09 ENCOUNTER — Encounter: Payer: Self-pay | Admitting: Neurology

## 2017-07-09 ENCOUNTER — Telehealth: Payer: Self-pay | Admitting: *Deleted

## 2017-07-09 ENCOUNTER — Other Ambulatory Visit: Payer: Self-pay

## 2017-07-09 ENCOUNTER — Ambulatory Visit: Payer: No Typology Code available for payment source | Admitting: Neurology

## 2017-07-09 VITALS — BP 142/96 | HR 112 | Ht 71.0 in | Wt 214.0 lb

## 2017-07-09 DIAGNOSIS — G90529 Complex regional pain syndrome I of unspecified lower limb: Secondary | ICD-10-CM

## 2017-07-09 MED ORDER — CARBAMAZEPINE 200 MG PO TABS: ORAL_TABLET | ORAL | 3 refills | Status: DC

## 2017-07-09 NOTE — Progress Notes (Signed)
Reason for visit: RSD, left leg   Referring physician: Dr. Zonia Kief. is a 37 y.o. male  History of present illness:  Shane Cole is a 37 year old right-handed white male with a history of an injury to the left foot with multiple fractures.  Shortly thereafter, the patient developed a chronic pain syndrome with shooting pains that go up from the foot anteriorly on the left leg to the knee and up to the hip area.  The patient denies overt back pain.  The patient has been seen by multiple physicians for pain management, he was followed through the [facility] but missed a urine drug screen and was ejected from that practice.  The patient has been on chronic opiate therapy, he has been getting Percocet 10/325 mg tablets, 90 tablets a month through Dr. Estella Husk in [place].  He claims that MRI of the lumbosacral spine was done he was told he had a bulging disc problem, EMG and nerve conduction study was also done.  The results of these studies are not available to me.  The patient is using a TENS unit with some benefit.  He is on nortriptyline and gabapentin.  He cannot tolerate higher doses of gabapentin secondary to depression.  The patient feels that there is some weakness of the left leg, he does report some numbness.  He denies any issues with the right leg.  He has chronic pain in the neck and right shoulder.  He has some gait instability, he has not had any falls.  He does have some episodes of diarrhea off and on but denies any issues controlling the bladder.  He is not working, but he is not on disability either.  He comes to this office for an evaluation.  He was just recently seen by Dr. Allena Katz from [facility] neurology, he was set up to see a local pain center, but never pursued this referral.  Past Medical History:  Diagnosis Date  . Allergy   . Anxiety   . Bipolar disorder (HCC)   . Bronchitis   . Crush injury lower leg    Left lower leg  .  Depression   . Gastric ulcer   . GERD (gastroesophageal reflux disease)    will awaken him in night  . Migraine   . Nerve pain   . RSD (reflex sympathetic dystrophy)     Past Surgical History:  Procedure Laterality Date  . CHOLECYSTECTOMY N/A 01/24/2014   Procedure: LAPAROSCOPIC CHOLECYSTECTOMY;  Surgeon: Axel Filler, MD;  Location: [facility] OR;  Service: General;  Laterality: N/A;  . COLONOSCOPY    . HAND SURGERY    . MASS EXCISION Left 01/25/2013   Procedure: EXCISION MASS DORSAL ASPECT LEFT LONG FINGER DISTAL INTERPHALANGEAL JOINT;  Surgeon: Wyn Forster., MD;  Location: [facility];  Service: Orthopedics;  Laterality: Left;  Left long   . MOUTH SURGERY      Family History  Problem Relation Age of Onset  . Hyperlipidemia Father   . Hypertension Father   . Anxiety disorder Father   . Drug abuse Father   . Cancer Paternal Grandfather        lung, colon  . Colon cancer Paternal Grandfather   . Lung cancer Paternal Grandfather   . Diabetes Paternal Grandmother   . Cancer Paternal Grandmother   . Cancer Maternal Grandmother   . Cancer Maternal Grandfather   . Lung cancer Maternal Grandfather   . Stroke Unknown   .  Heart disease Unknown   . Depression Paternal Aunt   . Anxiety disorder Paternal Aunt   . Drug abuse Paternal Uncle     Social history:  reports that he has been smoking cigarettes and e-cigarettes.  He has a 23.00 pack-year smoking history. He uses smokeless tobacco. He reports that he drinks about 3.6 oz of alcohol per week. He reports that he does not use drugs.  Medications:  Prior to Admission medications   Medication Sig Start Date End Date Taking? Authorizing Provider  acetaminophen (TYLENOL) 500 MG tablet Take 1,000-1,500 mg by mouth every 6 (six) hours as needed for moderate pain.   Yes [provider]  gabapentin (NEURONTIN) 300 MG capsule Take 1 capsule (300 mg total) by mouth 3 (three) times daily. 02/26/16  Yes Nafziger,  Kandee Keenory, NP  lidocaine (LINDAMANTLE) 3 % CREA cream Apply 1 application topically daily as needed (chronic pain).  12/11/16  Yes [provider]  Multiple Vitamin (MULTIVITAMIN WITH MINERALS) TABS tablet Take 1 tablet by mouth daily.   Yes [provider]  Nerve Stimulator (TENS THERAPY PAIN RELIEF) DEVI 1 application by Does not apply route daily as needed (chronic pain).   Yes [provider]  nortriptyline (PAMELOR) 75 MG capsule Take 1 capsule (75 mg total) by mouth at bedtime. 06/17/17  Yes Nafziger, Kandee Keenory, NP  ondansetron (ZOFRAN ODT) 4 MG disintegrating tablet Take 1 tablet (4 mg total) by mouth every 8 (eight) hours as needed for nausea or vomiting. 07/01/17  Yes Bethel BornGekas, Kelly Marie, PA-C  ondansetron (ZOFRAN-ODT) 4 MG disintegrating tablet DISSOLVE 1 TABLET ON TONGUE EVERY 8 HOURS AS NEEDED FOR NAUSEA AND VOMITING Patient taking differently: DISSOLVE 1 TABLET (4 mg) ON TONGUE EVERY 8 HOURS AS NEEDED FOR NAUSEA AND VOMITING 05/19/17  Yes Rachael FeeJacobs, Daniel P, MD  Pseudoephedrine HCl (SUDAFED PO) Take 1 tablet by mouth daily.   Yes [provider]  ranitidine (ZANTAC) 150 MG tablet Take 1 tablet (150 mg total) by mouth at bedtime. Patient taking differently: Take 150 mg by mouth 3 (three) times daily as needed for heartburn.  12/30/13  Yes Le, Thao P, DO  sildenafil (VIAGRA) 25 MG tablet Take 25 mg by mouth daily as needed (High BP).   Yes [provider]  tiZANidine (ZANAFLEX) 4 MG tablet Take 1 tablet by mouth 3 (three) times daily as needed. 05/13/17  Yes [provider]  varenicline (CHANTIX STARTING MONTH PAK) 0.5 MG X 11 & 1 MG X 42 tablet Take one 0.5 mg tablet by mouth once daily for 3 days, then increase to one 0.5 mg tablet twice daily for 4 days, then increase to one 1 mg tablet twice daily. 06/17/17  Yes Nafziger, Kandee Keenory, NP  carbamazepine (TEGRETOL) 200 MG tablet 1/2 tablet twice a day for 2 weeks, then take 1 tablet twice a day 07/09/17   York SpanielWillis,  Charles K, MD  clonazePAM (KLONOPIN) 1 MG tablet Take 1 tablet by mouth at bedtime. 01/06/17   [provider]      Allergies  Allergen Reactions  . Aspirin Other (See Comments)    Tinnitus, dizzy, short of breath  . Codeine Itching    Reaction to tylenol #3  . Penicillins Anaphylaxis    Was told never to take medication.  Has patient had a PCN reaction causing immediate rash, facial/tongue/throat swelling, SOB or lightheadedness with hypotension: no Has patient had a PCN reaction causing severe rash involving mucus membranes or skin necrosis: no Has patient had  a PCN reaction that required hospitalization: no Has patient had a PCN reaction occurring within the last 10 years: no If all of the above answers are "NO", then may proceed with Cephalosporin use.   . Amitriptyline Rash    ROS:  Out of a complete 14 system review of symptoms, the patient complains only of the following symptoms, and all other reviewed systems are negative.  Left leg pain  Blood pressure (!) 142/96, pulse (!) 112, height 5\' 11"  (1.803 m), weight 214 lb (97.1 kg).  Physical Exam  General: The patient is alert and cooperative at the time of the examination.  Eyes: Pupils are equal, round, and reactive to light. Discs are flat bilaterally.  Neck: The neck is supple, no carotid bruits are noted.  Respiratory: The respiratory examination is clear.  Cardiovascular: The cardiovascular examination reveals a regular rate and rhythm, no obvious murmurs or rubs are noted.  Neuromuscular:The patient is able to flex only about 90 degrees with the low back.  Skin: Extremities are without significant edema.  Neurologic Exam  Mental status: The patient is alert and oriented x 3 at the time of the examination. The patient has apparent normal recent and remote memory, with an apparently normal attention span and concentration ability.  Cranial nerves: Facial symmetry is present. There is good  sensation of the face to pinprick and soft touch bilaterally. The strength of the facial muscles and the muscles to head turning and shoulder shrug are normal bilaterally. Speech is well enunciated, no aphasia or dysarthria is noted. Extraocular movements are full. Visual fields are full. The tongue is midline, and the patient has symmetric elevation of the soft palate. No obvious hearing deficits are noted.  Motor: The motor testing reveals 5 over 5 strength of all 4 extremities. Good symmetric motor tone is noted throughout.  Sensory: Sensory testing is intact to pinprick, soft touch, vibration sensation, and position sense on all 4 extremities. No evidence of extinction is noted.  Coordination: Cerebellar testing reveals good finger-nose-finger and heel-to-shin bilaterally.  Gait and station: Gait is normal. Tandem gait is normal. Romberg is negative. No drift is seen.  The patient is able to walk on heels and toes bilaterally.  Reflexes: Deep tendon reflexes are symmetric and normal bilaterally. Toes are downgoing bilaterally.   Assessment/Plan:  1.  Reflex sympathetic dystrophy type I, left leg  The patient has chronic pain involving the left leg.  The patient has been getting chronic opioid therapy over the years.  I have indicated that I will be retiring within 1 year, I am no longer accepting patients requiring chronic opioid pain medications as no one else in this practice is willing or able to continue management.  The most appropriate source of treatment of his left leg if he desires opiate medications is to go through a pain center.  I will give him a trial on carbamazepine starting at 100 mg twice daily for 2 weeks and then go to 200 mg twice daily.  The patient will continue gabapentin for now, he will follow-up in 4 months.  If he desires a pain center referral, he will let me know.  Marlan Palau MD 07/09/2017 10:38 AM  [facility] [address] [place], [place] [address]  Phone [phone] Fax [phone]

## 2017-07-09 NOTE — Telephone Encounter (Signed)
Called, spoke with pt. Advised Dr. Anne HahnWillis would like records from [place] sent to our office. Since he was not referring provider we would need a signed record release form to request. He is going to contact  Dr. Alesia Richardsunheim's office to have them send records to us.

## 2017-07-09 NOTE — Patient Instructions (Signed)
We will start carbamazepine for the discomfort in the leg.  Please call for any dose adjustments.  Tegretol (carbamazepine) may result in dizziness, gait instability, cognitive slowing, or drowsiness. Sometimes, and allergic rash may occur, or a photosensitive rash may occur. If any significant side effects are noted, please contact our office.

## 2017-07-13 ENCOUNTER — Other Ambulatory Visit: Payer: Self-pay | Admitting: Adult Health

## 2017-07-13 DIAGNOSIS — K2100 Gastro-esophageal reflux disease with esophagitis, without bleeding: Secondary | ICD-10-CM

## 2017-07-14 NOTE — Telephone Encounter (Signed)
yes

## 2017-07-14 NOTE — Telephone Encounter (Signed)
Should gastro be filling?

## 2017-07-21 ENCOUNTER — Telehealth: Payer: Self-pay | Admitting: *Deleted

## 2017-07-21 NOTE — Telephone Encounter (Signed)
Notes from [facility] on [place] desk.

## 2017-07-22 ENCOUNTER — Telehealth: Payer: Self-pay | Admitting: Neurology

## 2017-07-22 NOTE — Telephone Encounter (Signed)
I have received medical records concerning this patient.  This is from the [facility] in [place], this is a note from June 5 of 2018, EMG done shows L5 greater than S1 greater than L4 radiculopathy.  There was a mild left median neuropathy at the wrist consistent with carpal tunnel syndrome.  He has undergone epidural steroid injections in the low back he has been treated with oxycodone 5 mg tablets, Duexis 26.6 mg / 800 mg tablets, meloxicam 15 mg, clonazepam 1 mg, one at night, gabapentin, nortriptyline, tramadol, and Norco 10 mg tablets.

## 2017-08-06 ENCOUNTER — Ambulatory Visit: Payer: Self-pay | Admitting: Neurology

## 2017-08-07 ENCOUNTER — Ambulatory Visit: Payer: No Typology Code available for payment source | Admitting: Physician Assistant

## 2017-08-13 ENCOUNTER — Encounter: Payer: Self-pay | Admitting: Family Medicine

## 2017-08-18 ENCOUNTER — Other Ambulatory Visit: Payer: Self-pay | Admitting: Adult Health

## 2017-08-18 DIAGNOSIS — R112 Nausea with vomiting, unspecified: Secondary | ICD-10-CM

## 2017-08-19 ENCOUNTER — Other Ambulatory Visit: Payer: Self-pay | Admitting: Adult Health

## 2017-08-19 DIAGNOSIS — R112 Nausea with vomiting, unspecified: Secondary | ICD-10-CM

## 2017-08-19 NOTE — Telephone Encounter (Signed)
THIS REFILL REQUEST SHOULD BE SENT TO THE PATIENT'S SPECIALIST.

## 2017-08-20 NOTE — Telephone Encounter (Signed)
DENIED.  REQUEST SHOULD BE SENT TO SPECIALIST.  MESSAGE SENT TO THE PHARMACY.

## 2017-09-03 ENCOUNTER — Other Ambulatory Visit: Payer: Self-pay | Admitting: Gastroenterology

## 2017-09-03 DIAGNOSIS — K2100 Gastro-esophageal reflux disease with esophagitis, without bleeding: Secondary | ICD-10-CM

## 2017-09-15 ENCOUNTER — Ambulatory Visit: Payer: No Typology Code available for payment source | Admitting: Family Medicine

## 2017-09-15 DIAGNOSIS — Z0289 Encounter for other administrative examinations: Secondary | ICD-10-CM

## 2017-11-09 ENCOUNTER — Ambulatory Visit: Payer: No Typology Code available for payment source | Admitting: Neurology

## 2017-12-18 ENCOUNTER — Ambulatory Visit (INDEPENDENT_AMBULATORY_CARE_PROVIDER_SITE_OTHER): Payer: No Typology Code available for payment source

## 2017-12-18 ENCOUNTER — Ambulatory Visit (INDEPENDENT_AMBULATORY_CARE_PROVIDER_SITE_OTHER): Payer: No Typology Code available for payment source | Admitting: Adult Health

## 2017-12-18 ENCOUNTER — Encounter: Payer: Self-pay | Admitting: Adult Health

## 2017-12-18 VITALS — BP 130/60 | HR 102 | Temp 95.0°F | Wt 226.2 lb

## 2017-12-18 DIAGNOSIS — R1310 Dysphagia, unspecified: Secondary | ICD-10-CM

## 2017-12-18 DIAGNOSIS — F17200 Nicotine dependence, unspecified, uncomplicated: Secondary | ICD-10-CM

## 2017-12-18 DIAGNOSIS — R1319 Other dysphagia: Secondary | ICD-10-CM

## 2017-12-18 DIAGNOSIS — R5383 Other fatigue: Secondary | ICD-10-CM | POA: Diagnosis not present

## 2017-12-18 LAB — CBC WITH DIFFERENTIAL/PLATELET
BASOS ABS: 0.1 10*3/uL (ref 0.0–0.1)
BASOS PCT: 1.1 % (ref 0.0–3.0)
EOS ABS: 0.6 10*3/uL (ref 0.0–0.7)
Eosinophils Relative: 10.2 % — ABNORMAL HIGH (ref 0.0–5.0)
HEMATOCRIT: 44.5 % (ref 39.0–52.0)
Hemoglobin: 15.3 g/dL (ref 13.0–17.0)
LYMPHS ABS: 2 10*3/uL (ref 0.7–4.0)
Lymphocytes Relative: 31.2 % (ref 12.0–46.0)
MCHC: 34.5 g/dL (ref 30.0–36.0)
MCV: 91.8 fl (ref 78.0–100.0)
Monocytes Absolute: 0.4 10*3/uL (ref 0.1–1.0)
Monocytes Relative: 6.6 % (ref 3.0–12.0)
NEUTROS ABS: 3.2 10*3/uL (ref 1.4–7.7)
NEUTROS PCT: 50.9 % (ref 43.0–77.0)
Platelets: 260 10*3/uL (ref 150.0–400.0)
RBC: 4.85 Mil/uL (ref 4.22–5.81)
RDW: 13.3 % (ref 11.5–15.5)
WBC: 6.4 10*3/uL (ref 4.0–10.5)

## 2017-12-18 LAB — BASIC METABOLIC PANEL
BUN: 16 mg/dL (ref 6–23)
CALCIUM: 9.6 mg/dL (ref 8.4–10.5)
CHLORIDE: 105 meq/L (ref 96–112)
CO2: 26 meq/L (ref 19–32)
Creatinine, Ser: 1.14 mg/dL (ref 0.40–1.50)
GFR: 76.28 mL/min (ref >60.00)
GLUCOSE: 76 mg/dL (ref 70–99)
Potassium: 4.2 mEq/L (ref 3.5–5.1)
Sodium: 142 mEq/L (ref 135–145)

## 2017-12-18 LAB — TSH: TSH: 1.25 u[IU]/mL (ref 0.35–4.50)

## 2017-12-18 LAB — VITAMIN D 25 HYDROXY (VIT D DEFICIENCY, FRACTURES): VITD: 18.46 ng/mL — ABNORMAL LOW (ref 30.00–100.00)

## 2017-12-18 MED ORDER — ONDANSETRON 4 MG PO TBDP: 4.0000 mg | ORAL_TABLET | Freq: Three times a day (TID) | ORAL | 2 refills | Status: DC | PRN

## 2017-12-18 MED ORDER — VARENICLINE TARTRATE 0.5 MG X 11 & 1 MG X 42 PO MISC: ORAL | 0 refills | Status: DC

## 2017-12-18 NOTE — Progress Notes (Signed)
Subjective:    Patient ID: Shane Cole., male    DOB: 1981-01-06, 37 y.o.   MRN: [id number]  HPI  37 year old male who  has a past medical history of Allergy, Anxiety, Bipolar disorder (HCC), Bronchitis, Crush injury lower leg, Depression, Gastric ulcer, GERD (gastroesophageal reflux disease), Migraine, Nerve pain, and RSD (reflex sympathetic dystrophy).  He presents to the office today for chronic issues of dysphagia. He reports that he feels as though it is getting harder to swallow food and drinks, he often gets choked up and he feels as though there is something stuck in his throat constantly. He had an endoscopy in December 2019 which showed:  LA Grade B esophagitis. - Small hiatal hernia. - Gastritis. Biopsied. - Normal examined duodenum.  Wt Readings from Last 3 Encounters:  12/18/17 226 lb 3.2 oz (102.6 kg)  07/09/17 214 lb (97.1 kg)  07/01/17 216 lb (98 kg)     He would also like to get a new prescription for chantix starting pack. He reports that he quit while taking this but has since started smoking again. He reports a chronic dry cough. Denies fevers. Would like to get a chest xray   Additionally, he would like to have some blood work done for fatigue. This again is a chronic issue. He has been drinking multiple energy drinks per day and reports that this does not help with his fatigue. He is sleeping well throughout the night. Denies waking up gasping for breath or snoring. His diet is poor.   Review of Systems See HPI   Past Medical History:  Diagnosis Date  . Allergy   . Anxiety   . Bipolar disorder (HCC)   . Bronchitis   . Crush injury lower leg    Left lower leg  . Depression   . Gastric ulcer   . GERD (gastroesophageal reflux disease)    will awaken him in night  . Migraine   . Nerve pain   . RSD (reflex sympathetic dystrophy)     Social History   Socioeconomic History  . Marital status: Married    Spouse name: Not on file  . Number of  children: 1  . Years of education: 10th grade  . Highest education level: Not on file  Occupational History  . Occupation: Disabled    Comment: Previously worked Herbalist  . Financial resource strain: Not on file  . Food insecurity:    Worry: Not on file    Inability: Not on file  . Transportation needs:    Medical: Not on file    Non-medical: Not on file  Tobacco Use  . Smoking status: Current Every Day Smoker    Packs/day: 1.00    Years: 23.00    Pack years: 23.00    Types: Cigarettes, E-cigarettes  . Smokeless tobacco: Current User  Substance and Sexual Activity  . Alcohol use: Yes    Alcohol/week: 6.0 standard drinks    Types: 6 Cans of beer per week    Comment: occasional  . Drug use: No  . Sexual activity: Yes    Partners: Female  Lifestyle  . Physical activity:    Days per week: Not on file    Minutes per session: Not on file  . Stress: Not on file  Relationships  . Social connections:    Talks on phone: Not on file    Gets together: Not on file    Attends  religious service: Not on file    Active member of club or organization: Not on file    Attends meetings of clubs or organizations: Not on file    Relationship status: Not on file  . Intimate partner violence:    Fear of current or ex partner: Not on file    Emotionally abused: Not on file    Physically abused: Not on file    Forced sexual activity: Not on file  Other Topics Concern  . Not on file  Social History Narrative   05/24/12  Shane Cole was born and grew up in [place], [place].    He has 2 sisters.    He reports that his childhood was "lousy."    He completed the 10th grade.    He has been married for 5 years, and is currently separated for 3 weeks.    He has a daughter who is 42-1/2 years old.    He has been unemployed for 2 years, and is disabled due to an on-the-job work accident.    He is currently living with his aunt and cousin.    He reports that his  hobbies are sports and dogs.    He reports that he is spiritual, but not religious.    He states that his aunt and his father are his social support system.    He denies any current legal problems, but got a DUI in 2007. 05/24/12 [facility]       Past Surgical History:  Procedure Laterality Date  . CHOLECYSTECTOMY N/A 01/24/2014   Procedure: LAPAROSCOPIC CHOLECYSTECTOMY;  Surgeon: Axel Filler, MD;  Location: [facility] OR;  Service: General;  Laterality: N/A;  . COLONOSCOPY    . HAND SURGERY    . MASS EXCISION Left 01/25/2013   Procedure: EXCISION MASS DORSAL ASPECT LEFT LONG FINGER DISTAL INTERPHALANGEAL JOINT;  Surgeon: Wyn Forster., MD;  Location: [facility];  Service: Orthopedics;  Laterality: Left;  Left long   . MOUTH SURGERY      Family History  Problem Relation Age of Onset  . Hyperlipidemia Father   . Hypertension Father   . Anxiety disorder Father   . Drug abuse Father   . Cancer Paternal Grandfather        lung, colon  . Colon cancer Paternal Grandfather   . Lung cancer Paternal Grandfather   . Diabetes Paternal Grandmother   . Cancer Paternal Grandmother   . Cancer Maternal Grandmother   . Cancer Maternal Grandfather   . Lung cancer Maternal Grandfather   . Stroke Unknown   . Heart disease Unknown   . Depression Paternal Aunt   . Anxiety disorder Paternal Aunt   . Drug abuse Paternal Uncle     Allergies  Allergen Reactions  . Aspirin Other (See Comments)    Tinnitus, dizzy, short of breath  . Codeine Itching    Reaction to tylenol #3  . Penicillins Anaphylaxis    Was told never to take medication.  Has patient had a PCN reaction causing immediate rash, facial/tongue/throat swelling, SOB or lightheadedness with hypotension: no Has patient had a PCN reaction causing severe rash involving mucus membranes or skin necrosis: no Has patient had a PCN reaction that required hospitalization: no Has patient had a PCN reaction occurring within the last 10  years: no If all of the above answers are "NO", then may proceed with Cephalosporin use.   . Amitriptyline Rash    Current Outpatient Medications on File  Prior to Visit  Medication Sig Dispense Refill  . acetaminophen (TYLENOL) 500 MG tablet Take 1,000-1,500 mg by mouth every 6 (six) hours as needed for moderate pain.    . clonazePAM (KLONOPIN) 1 MG tablet Take 1 tablet by mouth at bedtime.  2  . DEXILANT 30 MG capsule TAKE 1 CAPSULE BY MOUTH ONCE DAILY 60 capsule 0  . gabapentin (NEURONTIN) 300 MG capsule Take 1 capsule (300 mg total) by mouth 3 (three) times daily. 270 capsule 3  . lidocaine (LINDAMANTLE) 3 % CREA cream Apply 1 application topically daily as needed (chronic pain).   3  . Multiple Vitamin (MULTIVITAMIN WITH MINERALS) TABS tablet Take 1 tablet by mouth daily.    . Nerve Stimulator (TENS THERAPY PAIN RELIEF) DEVI 1 application by Does not apply route daily as needed (chronic pain).    . nortriptyline (PAMELOR) 75 MG capsule Take 1 capsule (75 mg total) by mouth at bedtime. 90 capsule 1  . Oxycodone HCl 10 MG TABS   0  . Pseudoephedrine HCl (SUDAFED PO) Take 1 tablet by mouth daily.    . ranitidine (ZANTAC) 150 MG tablet Take 1 tablet (150 mg total) by mouth at bedtime. (Patient taking differently: Take 150 mg by mouth 3 (three) times daily as needed for heartburn. ) 30 tablet 2  . sildenafil (VIAGRA) 25 MG tablet Take 25 mg by mouth daily as needed (High BP).    Marland Kitchen tiZANidine (ZANAFLEX) 4 MG tablet Take 1 tablet by mouth 3 (three) times daily as needed.  6   Current Facility-Administered Medications on File Prior to Visit  Medication Dose Route Frequency Provider Last Rate Last Dose  . 0.9 %  sodium chloride infusion  500 mL Intravenous Continuous Rachael Fee, MD        BP 130/60 (BP Location: Left Arm, Patient Position: Sitting, Cuff Size: Normal)   Pulse (!) 102   Temp (!) 95 F (35 C) (Oral)   Wt 226 lb 3.2 oz (102.6 kg)   SpO2 95%   BMI 31.55 kg/m         Objective:   Physical Exam  Constitutional: He is oriented to person, place, and time. He appears well-developed and well-nourished. No distress.  Cardiovascular: Normal rate, regular rhythm, normal heart sounds and intact distal pulses.  Pulmonary/Chest: Effort normal and breath sounds normal.  Abdominal: Soft. Bowel sounds are normal. He exhibits no distension and no mass. There is no tenderness. There is no rebound and no guarding. No hernia.  Musculoskeletal: Normal range of motion. He exhibits no edema, tenderness or deformity.  Walks with limping gait    Neurological: He is alert and oriented to person, place, and time.  Skin: Skin is warm and dry. He is not diaphoretic.  Psychiatric: He has a normal mood and affect. His behavior is normal. Judgment and thought content normal.  Nursing note and vitals reviewed.     Assessment & Plan:  1. Tobacco use disorder - Encouraged to quit smoking completely.  - DG Chest 2 View; Future - varenicline (CHANTIX STARTING MONTH PAK) 0.5 MG X 11 & 1 MG X 42 tablet; Take one 0.5 mg tablet by mouth once daily for 3 days, then increase to one 0.5 mg tablet twice daily for 4 days, then increase to one 1 mg tablet twice daily.  Dispense: 53 tablet; Refill: 0  2. Esophageal dysphagia - Will order Barium swallow. Ultimately he needs to follow up with his GI - DG Esophagus; Future  3. Fatigue, unspecified type - Likely multi factorial due to sedentary life style, poor diet, and tobacco use.  - TSH - CBC with Differential/Platelet - Basic Metabolic Panel - Vitamin D, 25-hydroxy  Shane Frees, NP

## 2018-01-14 ENCOUNTER — Telehealth: Payer: Self-pay

## 2018-01-14 NOTE — Telephone Encounter (Signed)
Copied from CRM [id number]. Topic: General - Other >> Jan 14, 2018 12:26 PM Gean Birchwood R wrote: Patient is calling to see if his barium swallow screening order was placed. Please advise.

## 2018-01-14 NOTE — Telephone Encounter (Signed)
Stanton Kidney, this test was ordered on 12/18/17.  Can you please check on this?

## 2018-01-18 ENCOUNTER — Encounter: Payer: Self-pay | Admitting: Family Medicine

## 2018-01-18 ENCOUNTER — Ambulatory Visit (INDEPENDENT_AMBULATORY_CARE_PROVIDER_SITE_OTHER): Payer: No Typology Code available for payment source | Admitting: Family Medicine

## 2018-01-18 VITALS — BP 130/84 | HR 92 | Temp 98.2°F | Resp 12 | Ht 71.0 in | Wt 228.1 lb

## 2018-01-18 DIAGNOSIS — L538 Other specified erythematous conditions: Secondary | ICD-10-CM

## 2018-01-18 DIAGNOSIS — H6011 Cellulitis of right external ear: Secondary | ICD-10-CM

## 2018-01-18 MED ORDER — DOXYCYCLINE HYCLATE 100 MG PO TABS: 100.0000 mg | ORAL_TABLET | Freq: Two times a day (BID) | ORAL | 0 refills | Status: AC

## 2018-01-18 MED ORDER — METHYLPREDNISOLONE ACETATE 40 MG/ML IJ SUSP
40.0000 mg | Freq: Once | INTRAMUSCULAR | Status: AC
  Administered 2018-01-18: 40 mg via INTRAMUSCULAR

## 2018-01-18 MED ORDER — PREDNISONE 20 MG PO TABS: 40.0000 mg | ORAL_TABLET | Freq: Every day | ORAL | 0 refills | Status: AC

## 2018-01-18 NOTE — Progress Notes (Signed)
ACUTE VISIT   HPI:  Chief Complaint  Patient presents with  . Bump on left ear    started 1 week ago, painful and redness    [name]. is a 37 y.o. male, who is here today complaining of tenderness and erythema of external right ear. Gradual onset.  Initially area was pruritic. He does not remember any injury or insect bite. Initially a "red bump." Erythema is getting worse and now he also has edema.  Throbbing severe pain, constant and exacerbated by palpation/pressure when lying on right side in bed.  He has not noted fever, chills, ear drainage, or changes in hearing. He is on chronic opioid use, oxycodone, which is not helping with pain.  He has not taking OTC medication.   Review of Systems  Constitutional: Positive for fatigue. Negative for appetite change and fever.  HENT: Positive for ear pain. Negative for facial swelling, hearing loss, mouth sores and sore throat.   Respiratory: Negative for cough, shortness of breath and wheezing.   Gastrointestinal: Negative for abdominal pain, nausea and vomiting.  Genitourinary: Negative for decreased urine volume and hematuria.  Musculoskeletal: Negative for joint swelling and myalgias.  Skin: Positive for rash. Negative for wound.  Hematological: Negative for adenopathy. Does not bruise/bleed easily.      Current Outpatient Medications on File Prior to Visit  Medication Sig Dispense Refill  . acetaminophen (TYLENOL) 500 MG tablet Take 1,000-1,500 mg by mouth every 6 (six) hours as needed for moderate pain.    . clonazePAM (KLONOPIN) 1 MG tablet Take 1 tablet by mouth at bedtime.  2  . DEXILANT 30 MG capsule TAKE 1 CAPSULE BY MOUTH ONCE DAILY 60 capsule 0  . gabapentin (NEURONTIN) 300 MG capsule Take 1 capsule (300 mg total) by mouth 3 (three) times daily. 270 capsule 3  . ibuprofen (ADVIL,MOTRIN) 800 MG tablet   3  . lidocaine (LINDAMANTLE) 3 % CREA cream Apply 1 application topically daily as  needed (chronic pain).   3  . Multiple Vitamin (MULTIVITAMIN WITH MINERALS) TABS tablet Take 1 tablet by mouth daily.    . Nerve Stimulator (TENS THERAPY PAIN RELIEF) DEVI 1 application by Does not apply route daily as needed (chronic pain).    . nortriptyline (PAMELOR) 75 MG capsule Take 1 capsule (75 mg total) by mouth at bedtime. 90 capsule 1  . ondansetron (ZOFRAN ODT) 4 MG disintegrating tablet Take 1 tablet (4 mg total) by mouth every 8 (eight) hours as needed for nausea or vomiting. 20 tablet 2  . Oxycodone HCl 10 MG TABS   0  . Pseudoephedrine HCl (SUDAFED PO) Take 1 tablet by mouth daily.    . ranitidine (ZANTAC) 150 MG tablet Take 1 tablet (150 mg total) by mouth at bedtime. (Patient taking differently: Take 150 mg by mouth 3 (three) times daily as needed for heartburn. ) 30 tablet 2  . sildenafil (REVATIO) 20 MG tablet   6  . sildenafil (VIAGRA) 25 MG tablet Take 25 mg by mouth daily as needed (High BP).    Marland Kitchen tiZANidine (ZANAFLEX) 4 MG tablet Take 1 tablet by mouth 3 (three) times daily as needed.  6  . varenicline (CHANTIX STARTING MONTH PAK) 0.5 MG X 11 & 1 MG X 42 tablet Take one 0.5 mg tablet by mouth once daily for 3 days, then increase to one 0.5 mg tablet twice daily for 4 days, then increase to one 1 mg tablet twice daily.  53 tablet 0   Current Facility-Administered Medications on File Prior to Visit  Medication Dose Route Frequency Provider Last Rate Last Dose  . 0.9 %  sodium chloride infusion  500 mL Intravenous Continuous Rachael Fee, MD         Past Medical History:  Diagnosis Date  . Allergy   . Anxiety   . Bipolar disorder (HCC)   . Bronchitis   . Crush injury lower leg    Left lower leg  . Depression   . Gastric ulcer   . GERD (gastroesophageal reflux disease)    will awaken him in night  . Migraine   . Nerve pain   . RSD (reflex sympathetic dystrophy)    Allergies  Allergen Reactions  . Aspirin Other (See Comments) and Tinitus    Tinnitus, dizzy,  short of breath  . Codeine Itching    Reaction to tylenol #3  . Penicillins Anaphylaxis and Other (See Comments)    Was told never to take medication.  Has patient had a PCN reaction causing immediate rash, facial/tongue/throat swelling, SOB or lightheadedness with hypotension: no Has patient had a PCN reaction causing severe rash involving mucus membranes or skin necrosis: no Has patient had a PCN reaction that required hospitalization: no Has patient had a PCN reaction occurring within the last 10 years: no If all of the above answers are "NO", then may proceed with Cephalosporin use.   . Tramadol     seizure Other reaction(s): Seizures  . Amitriptyline Rash    Social History   Socioeconomic History  . Marital status: Married    Spouse name: Not on file  . Number of children: 1  . Years of education: 10th grade  . Highest education level: Not on file  Occupational History  . Occupation: Disabled    Comment: Previously worked Herbalist  . Financial resource strain: Not on file  . Food insecurity:    Worry: Not on file    Inability: Not on file  . Transportation needs:    Medical: Not on file    Non-medical: Not on file  Tobacco Use  . Smoking status: Current Every Day Smoker    Packs/day: 1.00    Years: 23.00    Pack years: 23.00    Types: Cigarettes, E-cigarettes  . Smokeless tobacco: Current User  Substance and Sexual Activity  . Alcohol use: Yes    Alcohol/week: 6.0 standard drinks    Types: 6 Cans of beer per week    Comment: occasional  . Drug use: No  . Sexual activity: Yes    Partners: Female  Lifestyle  . Physical activity:    Days per week: Not on file    Minutes per session: Not on file  . Stress: Not on file  Relationships  . Social connections:    Talks on phone: Not on file    Gets together: Not on file    Attends religious service: Not on file    Active member of club or organization: Not on file     Attends meetings of clubs or organizations: Not on file    Relationship status: Not on file  Other Topics Concern  . Not on file  Social History Narrative   05/24/12  Raiquan was born and grew up in [place], [place].    He has 2 sisters.    He reports that his childhood was "lousy."    He completed the  10th grade.    He has been married for 5 years, and is currently separated for 3 weeks.    He has a daughter who is 25-1/2 years old.    He has been unemployed for 2 years, and is disabled due to an on-the-job work accident.    He is currently living with his aunt and cousin.    He reports that his hobbies are sports and dogs.    He reports that he is spiritual, but not religious.    He states that his aunt and his father are his social support system.    He denies any current legal problems, but got a DUI in 2007. 05/24/12 [facility]       Vitals:   01/18/18 0910  BP: 130/84  Pulse: 92  Resp: 12  Temp: 98.2 F (36.8 C)  SpO2: 98%   Body mass index is 31.81 kg/m.   Physical Exam  Constitutional: He appears well-developed. No distress.  HENT:  Head: Normocephalic and atraumatic.  Right Ear: Hearing, tympanic membrane and ear canal normal. There is swelling and tenderness. No drainage. No mastoid tenderness.  Left Ear: Hearing, tympanic membrane, external ear and ear canal normal. No mastoid tenderness.  Ears:  Mouth/Throat: Oropharynx is clear and moist and mucous membranes are normal.  Eyes: Conjunctivae are normal.  Neck: No tracheal deviation present.  Cardiovascular: Normal rate and regular rhythm.  No murmur heard. Respiratory: Effort normal and breath sounds normal. No respiratory distress.  Musculoskeletal: He exhibits no edema.       Cervical back: He exhibits no tenderness and no bony tenderness.  Lymphadenopathy:       Head (right side): No preauricular and no posterior auricular adenopathy present.    He has no cervical adenopathy.  Neurological: He has normal  strength. No cranial nerve deficit. Gait normal.  Skin: Skin is warm. No rash noted. There is erythema.  See HENT.  Psychiatric: His mood appears anxious.  Well groomed, good eye contact.     ASSESSMENT AND PLAN:   Mr. Addam was seen today for bump on left ear.  Diagnoses and all orders for this visit:  Cellulitis of earlobe, right -     doxycycline (VIBRA-TABS) 100 MG tablet; Take 1 tablet (100 mg total) by mouth 2 (two) times daily for 7 days. -     predniSONE (DELTASONE) 20 MG tablet; Take 2 tablets (40 mg total) by mouth daily with breakfast for 5 days. -     methylPREDNISolone acetate (DEPO-MEDROL) injection 40 mg  Erythema of external ear -     methylPREDNISolone acetate (DEPO-MEDROL) injection 40 mg    We discussed possible etiologies.  ?  Insect bite. Will treat empirically with oral antibiotics. Here in the office and after verbal consent he received Depo-Medrol 40 mg IM x1.  Tomorrow he will start prednisone, recommend completing 5 days.  Hopefully oral steroids will help with auricular edema and erythema, possible complications discussed.  I do not think ER evaluation or imaging are needed at this time. She was clearly instructed about warning signs. Follow-up with PCP in 2 to 3 days.  Return in about 3 days (around 01/21/2018) for 2-3 days with PCP.       Tyriq Moragne G. [place], MD  [facility]. [place] office.

## 2018-01-18 NOTE — Patient Instructions (Addendum)
A few things to remember from today's visit:   Cellulitis of earlobe, right - Plan: doxycycline (VIBRA-TABS) 100 MG tablet, predniSONE (DELTASONE) 20 MG tablet  Erythema of external ear    Start taking prednisone tomorrow with breakfast. Please follow with your PCP in 48 to 72 hours, before if symptoms get worse.

## 2018-01-19 ENCOUNTER — Ambulatory Visit: Payer: No Typology Code available for payment source | Admitting: Adult Health

## 2018-01-19 NOTE — Telephone Encounter (Signed)
Called pt to inform that he is scheduled for  01/21/2018@9 :30 nop 3 pt stated that he could not make that appt and will call to reschedule provided him with the number to reschedule

## 2018-01-21 ENCOUNTER — Encounter: Payer: Self-pay | Admitting: Adult Health

## 2018-01-21 ENCOUNTER — Ambulatory Visit (INDEPENDENT_AMBULATORY_CARE_PROVIDER_SITE_OTHER): Payer: No Typology Code available for payment source | Admitting: Adult Health

## 2018-01-21 ENCOUNTER — Ambulatory Visit (HOSPITAL_COMMUNITY): Payer: No Typology Code available for payment source

## 2018-01-21 VITALS — BP 122/80 | Temp 98.3°F | Wt 228.0 lb

## 2018-01-21 DIAGNOSIS — H6001 Abscess of right external ear: Secondary | ICD-10-CM

## 2018-01-21 DIAGNOSIS — Q181 Preauricular sinus and cyst: Secondary | ICD-10-CM | POA: Insufficient documentation

## 2018-01-21 DIAGNOSIS — K219 Gastro-esophageal reflux disease without esophagitis: Secondary | ICD-10-CM | POA: Insufficient documentation

## 2018-01-21 NOTE — Progress Notes (Signed)
Subjective:    Patient ID: Shane Cole., male    DOB: 1980-06-24, 37 y.o.   MRN: [id number]  HPI 37 year old male who  has a past medical history of Allergy, Anxiety, Bipolar disorder (HCC), Bronchitis, Crush injury lower leg, Depression, Gastric ulcer, GERD (gastroesophageal reflux disease), Migraine, Nerve pain, and RSD (reflex sympathetic dystrophy).  He presents to the office today for follow up regarding cellulitis of right earlobe. He was seen by another provider in the office 3 days ago and was prescribed a course of doxycycline and prednisone for suspected cellulitis. Today in the office he reports that he has not noticed much change and feels as though there has been more swelling and pain. Per patient " it feels like my ear is going to explode". He has noticed some mucoid drainage but is not sure where it has been coming from.    Review of Systems See HPI   Past Medical History:  Diagnosis Date  . Allergy   . Anxiety   . Bipolar disorder (HCC)   . Bronchitis   . Crush injury lower leg    Left lower leg  . Depression   . Gastric ulcer   . GERD (gastroesophageal reflux disease)    will awaken him in night  . Migraine   . Nerve pain   . RSD (reflex sympathetic dystrophy)     Social History   Socioeconomic History  . Marital status: Married    Spouse name: Not on file  . Number of children: 1  . Years of education: 10th grade  . Highest education level: Not on file  Occupational History  . Occupation: Disabled    Comment: Previously worked Herbalist  . Financial resource strain: Not on file  . Food insecurity:    Worry: Not on file    Inability: Not on file  . Transportation needs:    Medical: Not on file    Non-medical: Not on file  Tobacco Use  . Smoking status: Current Every Day Smoker    Packs/day: 1.00    Years: 23.00    Pack years: 23.00    Types: Cigarettes, E-cigarettes  . Smokeless tobacco: Current  User  Substance and Sexual Activity  . Alcohol use: Yes    Alcohol/week: 6.0 standard drinks    Types: 6 Cans of beer per week    Comment: occasional  . Drug use: No  . Sexual activity: Yes    Partners: Female  Lifestyle  . Physical activity:    Days per week: Not on file    Minutes per session: Not on file  . Stress: Not on file  Relationships  . Social connections:    Talks on phone: Not on file    Gets together: Not on file    Attends religious service: Not on file    Active member of club or organization: Not on file    Attends meetings of clubs or organizations: Not on file    Relationship status: Not on file  . Intimate partner violence:    Fear of current or ex partner: Not on file    Emotionally abused: Not on file    Physically abused: Not on file    Forced sexual activity: Not on file  Other Topics Concern  . Not on file  Social History Narrative   05/24/12  Stancil was born and grew up in [place], [place].    He  has 2 sisters.    He reports that his childhood was "lousy."    He completed the 10th grade.    He has been married for 5 years, and is currently separated for 3 weeks.    He has a daughter who is 45-1/2 years old.    He has been unemployed for 2 years, and is disabled due to an on-the-job work accident.    He is currently living with his aunt and cousin.    He reports that his hobbies are sports and dogs.    He reports that he is spiritual, but not religious.    He states that his aunt and his father are his social support system.    He denies any current legal problems, but got a DUI in 2007. 05/24/12 [facility]       Past Surgical History:  Procedure Laterality Date  . CHOLECYSTECTOMY N/A 01/24/2014   Procedure: LAPAROSCOPIC CHOLECYSTECTOMY;  Surgeon: Axel Filler, MD;  Location: [facility] OR;  Service: General;  Laterality: N/A;  . COLONOSCOPY    . HAND SURGERY    . MASS EXCISION Left 01/25/2013   Procedure: EXCISION MASS DORSAL ASPECT LEFT LONG FINGER  DISTAL INTERPHALANGEAL JOINT;  Surgeon: Wyn Forster., MD;  Location: [facility];  Service: Orthopedics;  Laterality: Left;  Left long   . MOUTH SURGERY      Family History  Problem Relation Age of Onset  . Hyperlipidemia Father   . Hypertension Father   . Anxiety disorder Father   . Drug abuse Father   . Cancer Paternal Grandfather        lung, colon  . Colon cancer Paternal Grandfather   . Lung cancer Paternal Grandfather   . Diabetes Paternal Grandmother   . Cancer Paternal Grandmother   . Cancer Maternal Grandmother   . Cancer Maternal Grandfather   . Lung cancer Maternal Grandfather   . Stroke Unknown   . Heart disease Unknown   . Depression Paternal Aunt   . Anxiety disorder Paternal Aunt   . Drug abuse Paternal Uncle     Allergies  Allergen Reactions  . Aspirin Other (See Comments) and Tinitus    Tinnitus, dizzy, short of breath  . Codeine Itching    Reaction to tylenol #3  . Penicillins Anaphylaxis and Other (See Comments)    Was told never to take medication.  Has patient had a PCN reaction causing immediate rash, facial/tongue/throat swelling, SOB or lightheadedness with hypotension: no Has patient had a PCN reaction causing severe rash involving mucus membranes or skin necrosis: no Has patient had a PCN reaction that required hospitalization: no Has patient had a PCN reaction occurring within the last 10 years: no If all of the above answers are "NO", then may proceed with Cephalosporin use.   . Tramadol     seizure Other reaction(s): Seizures  . Amitriptyline Rash    Current Outpatient Medications on File Prior to Visit  Medication Sig Dispense Refill  . acetaminophen (TYLENOL) 500 MG tablet Take 1,000-1,500 mg by mouth every 6 (six) hours as needed for moderate pain.    . clonazePAM (KLONOPIN) 1 MG tablet Take 1 tablet by mouth at bedtime.  2  . DEXILANT 30 MG capsule TAKE 1 CAPSULE BY MOUTH ONCE DAILY 60 capsule 0  . doxycycline  (VIBRA-TABS) 100 MG tablet Take 1 tablet (100 mg total) by mouth 2 (two) times daily for 7 days. 14 tablet 0  . gabapentin (NEURONTIN) 300 MG  capsule Take 1 capsule (300 mg total) by mouth 3 (three) times daily. 270 capsule 3  . ibuprofen (ADVIL,MOTRIN) 800 MG tablet   3  . lidocaine (LINDAMANTLE) 3 % CREA cream Apply 1 application topically daily as needed (chronic pain).   3  . Multiple Vitamin (MULTIVITAMIN WITH MINERALS) TABS tablet Take 1 tablet by mouth daily.    . Nerve Stimulator (TENS THERAPY PAIN RELIEF) DEVI 1 application by Does not apply route daily as needed (chronic pain).    . nortriptyline (PAMELOR) 75 MG capsule Take 1 capsule (75 mg total) by mouth at bedtime. 90 capsule 1  . ondansetron (ZOFRAN ODT) 4 MG disintegrating tablet Take 1 tablet (4 mg total) by mouth every 8 (eight) hours as needed for nausea or vomiting. 20 tablet 2  . Oxycodone HCl 10 MG TABS   0  . predniSONE (DELTASONE) 20 MG tablet Take 2 tablets (40 mg total) by mouth daily with breakfast for 5 days. 10 tablet 0  . Pseudoephedrine HCl (SUDAFED PO) Take 1 tablet by mouth daily.    . ranitidine (ZANTAC) 150 MG tablet Take 1 tablet (150 mg total) by mouth at bedtime. (Patient taking differently: Take 150 mg by mouth 3 (three) times daily as needed for heartburn. ) 30 tablet 2  . sildenafil (REVATIO) 20 MG tablet   6  . sildenafil (VIAGRA) 25 MG tablet Take 25 mg by mouth daily as needed (High BP).    Marland Kitchen tiZANidine (ZANAFLEX) 4 MG tablet Take 1 tablet by mouth 3 (three) times daily as needed.  6  . varenicline (CHANTIX STARTING MONTH PAK) 0.5 MG X 11 & 1 MG X 42 tablet Take one 0.5 mg tablet by mouth once daily for 3 days, then increase to one 0.5 mg tablet twice daily for 4 days, then increase to one 1 mg tablet twice daily. 53 tablet 0   Current Facility-Administered Medications on File Prior to Visit  Medication Dose Route Frequency Provider Last Rate Last Dose  . 0.9 %  sodium chloride infusion  500 mL  Intravenous Continuous Rachael Fee, MD        BP 122/80   Temp 98.3 F (36.8 C)   Wt 228 lb (103.4 kg)   BMI 31.80 kg/m       Objective:   Physical Exam  Constitutional: He appears well-developed and well-nourished. No distress.  HENT:  Ears:  No drainage noted   Eyes: Pupils are equal, round, and reactive to light. Conjunctivae and EOM are normal.  Neck: Normal range of motion. Neck supple.  Cardiovascular: Normal rate, regular rhythm, normal heart sounds and intact distal pulses.  Pulmonary/Chest: Effort normal and breath sounds normal.  Skin: He is not diaphoretic.  Nursing note and vitals reviewed.     Assessment & Plan:  1. Abscess of right external ear Procedure:  Incision and drainage of abscess Risks, benefits, and alternatives explained and consent obtained. Time out conducted. Surface cleaned with alcohol.  0.5cc lidocaine WITHOUT epinephine infiltrated around abscess. Adequate anesthesia ensured. Area prepped and draped in a sterile fashion. Using a 18 gauge needle stab incision made.  No Pus expressed with pressure. - Will need to send to ENT for further evaluation.   - Ambulatory referral to ENT  Shirline Frees, NP

## 2018-02-05 ENCOUNTER — Ambulatory Visit (HOSPITAL_COMMUNITY)
Admission: RE | Admit: 2018-02-05 | Discharge: 2018-02-05 | Disposition: A | Discharge status: Home / Self Care | Payer: No Typology Code available for payment source | Source: Ambulatory Visit | Attending: Adult Health | Admitting: Adult Health

## 2018-02-05 DIAGNOSIS — R1310 Dysphagia, unspecified: Secondary | ICD-10-CM | POA: Insufficient documentation

## 2018-02-05 DIAGNOSIS — R1319 Other dysphagia: Secondary | ICD-10-CM

## 2018-02-05 IMAGING — RF DG ESOPHAGUS
11 series · 23 of 24 positions shown · non-contrast
Comparison: None.

CLINICAL DATA: Esophageal dysphagia, chronic dysphagia.

EXAM:
ESOPHOGRAM / BARIUM SWALLOW / BARIUM TABLET STUDY
TECHNIQUE: Combined double contrast and single contrast examination performed
using effervescent crystals, thick barium liquid, and thin barium
liquid. The patient was observed with fluoroscopy swallowing a 13 mm
barium sulphate tablet.
FLUOROSCOPY TIME:  Fluoroscopy Time:  2.2 minutes
Radiation Exposure Index (if provided by the fluoroscopic device):
29.7 mGy

[Series 1: cp_standard · 0.35mm/px · 2 of 22 frames shown (1 of 11)]
[frame 4/22]
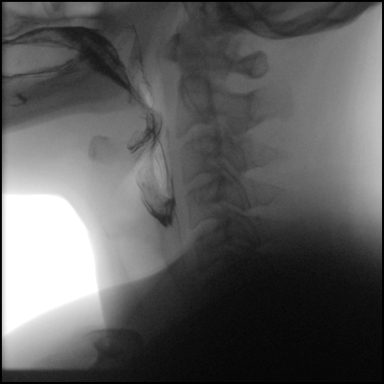
[frame 18/22]
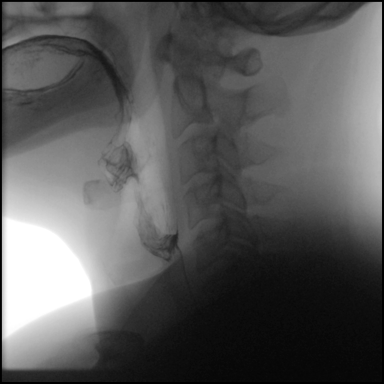

[Series 2: cp_standard · 0.35mm/px · 3 of 84 frames shown (2 of 11)]
[frame 13/84]
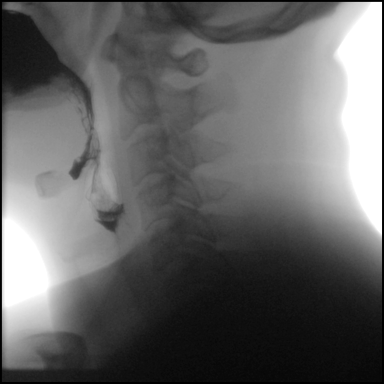
[frame 34/84]
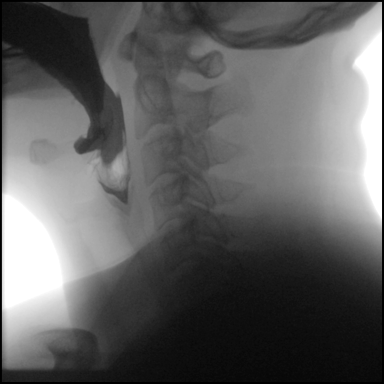
[frame 72/84]
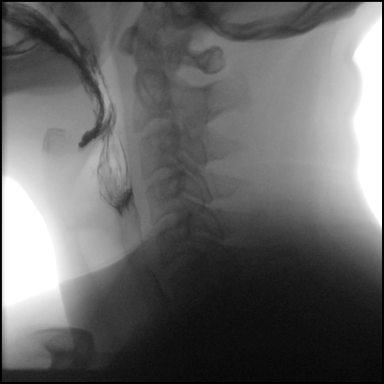

[Series 4: cp_standard · 0.35mm/px · 2 of 69 frames shown (3 of 11)]
[frame 11/69]
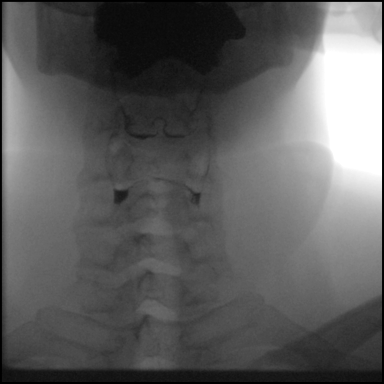
[frame 55/69]
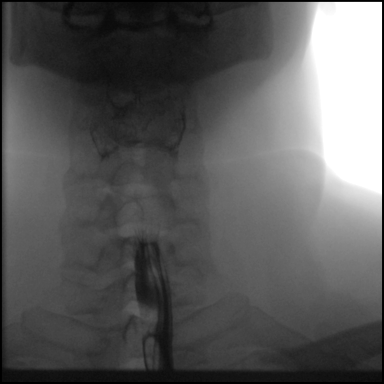

[Series 5: cp_standard · 0.17mm/px · 1 of 1 slices shown (4 of 11)]
[im 1/1]
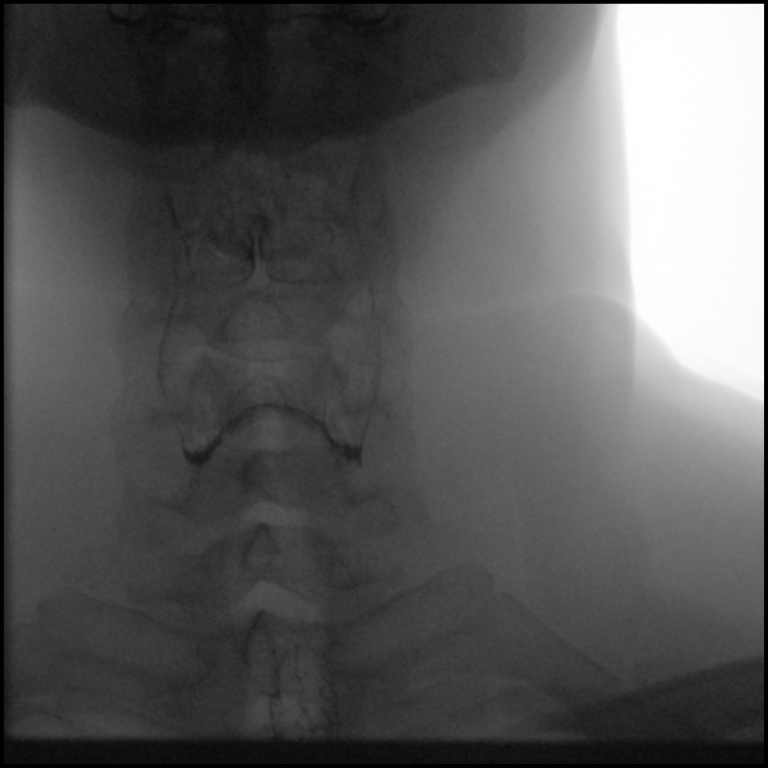

[Series 6: cp_standard · 0.35mm/px · 2 of 228 frames shown (5 of 11)]
[frame 95/228]
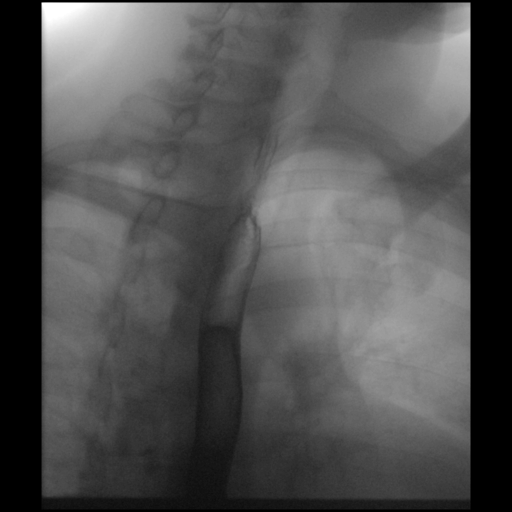
[frame 115/228]
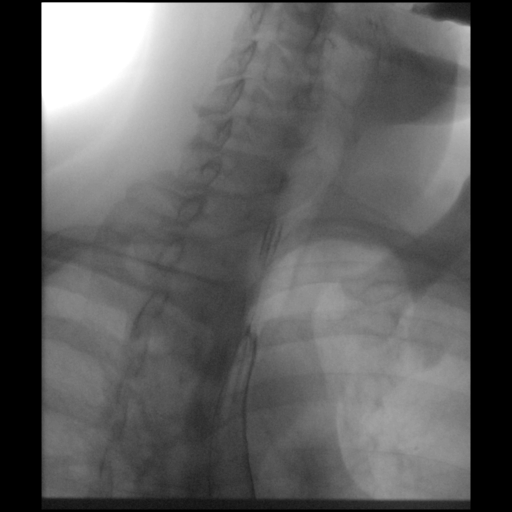

[Series 7: cp_standard · 0.35mm/px · 1 of 72 frames shown (6 of 11)]
[frame 11/72]
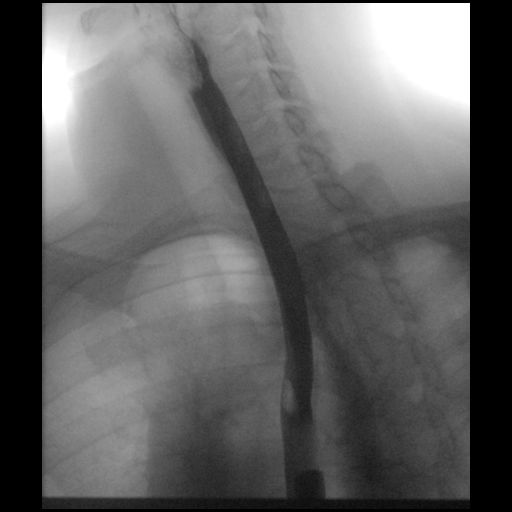

[Series 8: cp_standard · 0.35mm/px · 2 of 110 frames shown (7 of 11)]
[frame 17/110]
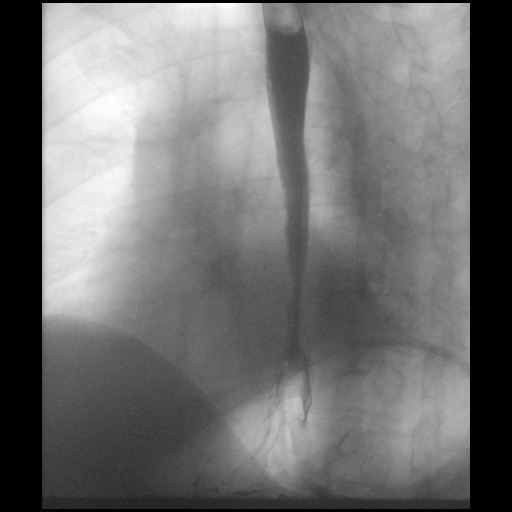
[frame 75/110]
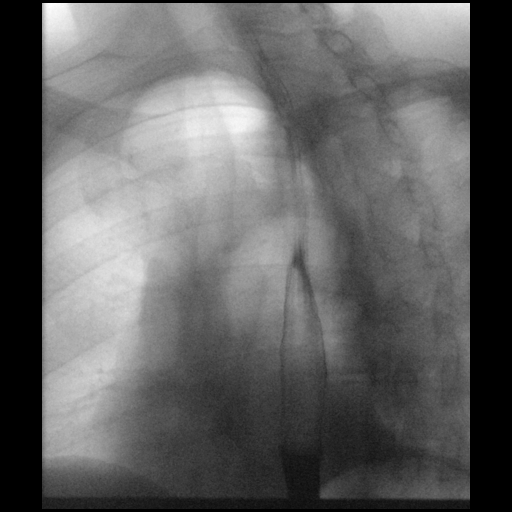

[Series 9: cp_standard · 0.35mm/px · 3 of 82 frames shown (8 of 11)]
[frame 13/82]
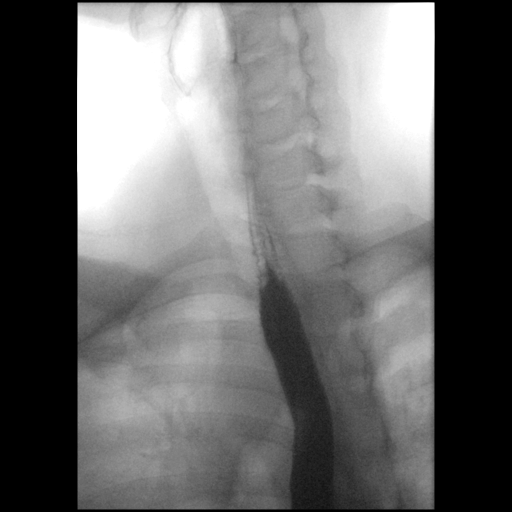
[frame 42/82]
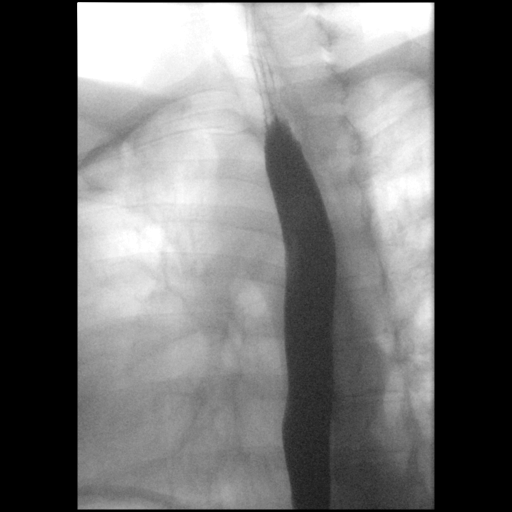
[frame 79/82]
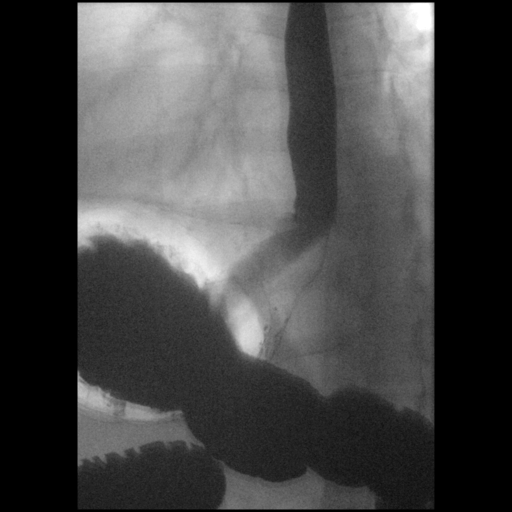

[Series 10: cp_standard · 0.35mm/px · 2 of 82 frames shown (9 of 11)]
[frame 31/82]
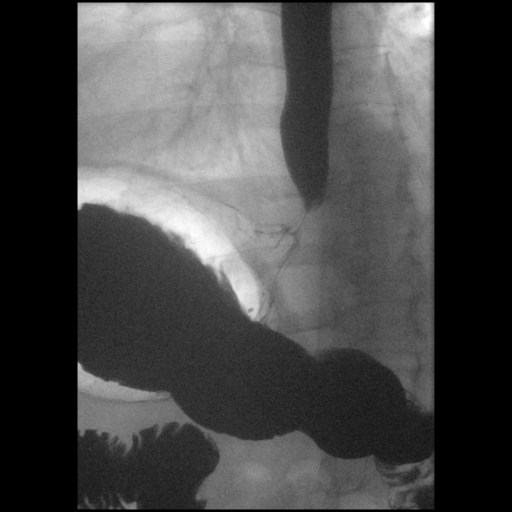
[frame 70/82]
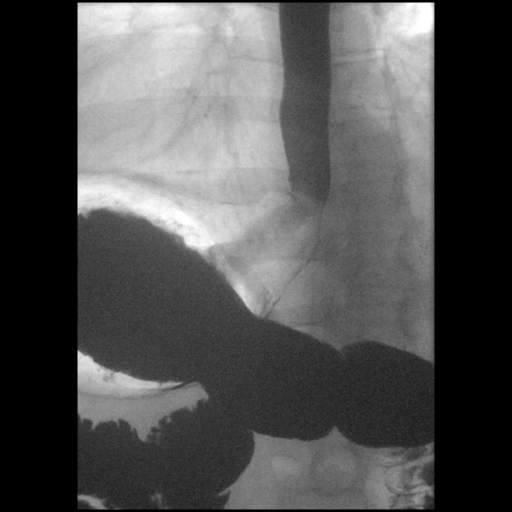

[Series 12: cp_standard · 0.36mm/px · 3 of 98 frames shown (10 of 11)]
[frame 15/98]
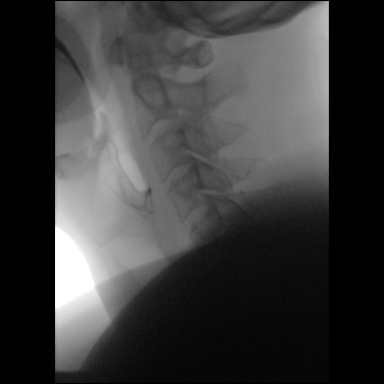
[frame 50/98]
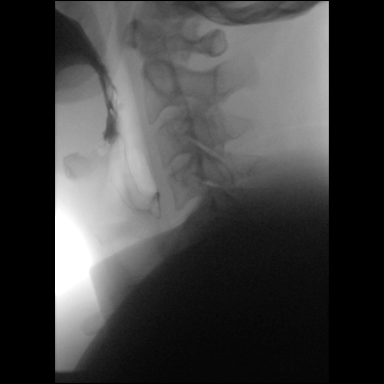
[frame 84/98]
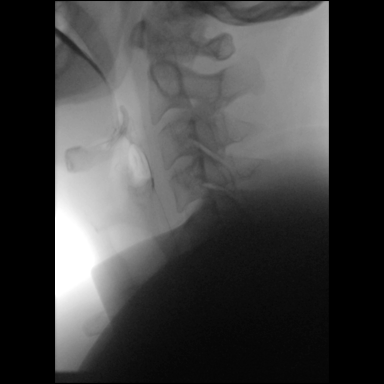

[Series 13: cp_standard · 0.52mm/px · 2 of 95 frames shown (11 of 11)]
[frame 35/95]
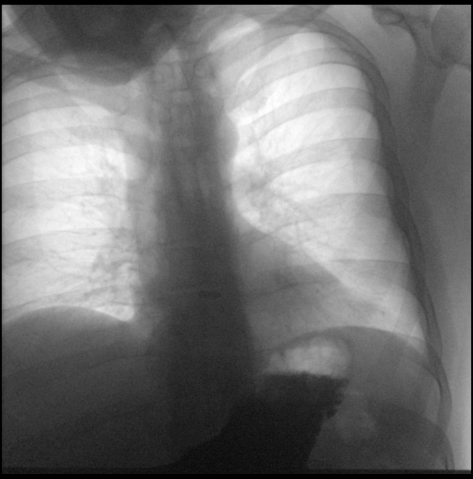
[frame 81/95]
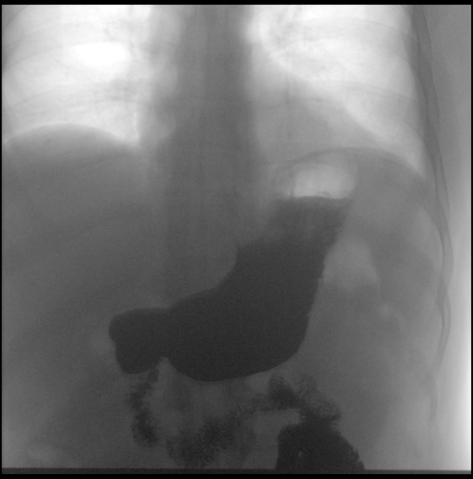

[23 of 24 positions shown; findings below may reference images not displayed]

FINDINGS: Patient was administered barium contrast orally during fluoroscopic
evaluation.

Hypopharynx and cervical esophagus appeared normal without mass,
ulceration or other focal wall irregularity. Contrast moved promptly
into the thoracic esophagus without obstruction or dysmotility. No
aspiration event.

Thoracic esophagus appeared normal without mass, ulceration or other
focal wall irregularity. Contrast moved promptly through the
thoracic esophagus and into the stomach without evidence of
obstruction or dysmotility. No hiatal hernia seen despite Valsalva
maneuvers. No gastroesophageal reflux visualized.

13 mm barium tablet passed easily through the esophagus and into the
stomach without obstruction.
IMPRESSION: Normal esophagram.

## 2018-02-16 ENCOUNTER — Telehealth: Payer: Self-pay

## 2018-02-16 ENCOUNTER — Ambulatory Visit (INDEPENDENT_AMBULATORY_CARE_PROVIDER_SITE_OTHER): Payer: No Typology Code available for payment source

## 2018-02-16 DIAGNOSIS — Z23 Encounter for immunization: Secondary | ICD-10-CM

## 2018-02-16 MED ORDER — OMEPRAZOLE 40 MG PO CPDR: 40.0000 mg | DELAYED_RELEASE_CAPSULE | Freq: Every day | ORAL | 11 refills | Status: DC

## 2018-02-16 MED ORDER — FAMOTIDINE 20 MG PO TABS: 20.0000 mg | ORAL_TABLET | Freq: Every day | ORAL | 11 refills | Status: DC

## 2018-02-16 NOTE — Telephone Encounter (Signed)
    I think he has his antiacid classes but mixed up.  He should be on proton pump inhibitor to replace the Dexilant.  Omeprazole and Prilosec are the same thing.  I recommend omeprazole 40 mg pills 1 pill shortly before breakfast every morning.  Dispense 30 with 11 refills.  He was also previously taken ranitidine at bedtime.  I recommend we replace this with famotidine 20 mg 1 pill at bedtime every night.  These are very inexpensive over-the-counter however if he wants a prescription please prescribe 30 pills with 11 refills.

## 2018-02-16 NOTE — Telephone Encounter (Signed)
Pt is returning Lindsay's call .

## 2018-02-16 NOTE — Telephone Encounter (Signed)
Patient was seen in office today and stated that his discount card for the Dexilant has expired. patient would like famotidine 20mg  or 40mg  sent in to replace this.  Patient would also like Prilosec sent in to replace omeprozole.  Please advise

## 2018-02-16 NOTE — Telephone Encounter (Signed)
Pres[facility] per Dr Christella HartiganJacobs recommendations.

## 2018-03-05 ENCOUNTER — Other Ambulatory Visit: Payer: Self-pay | Admitting: Adult Health

## 2018-03-08 ENCOUNTER — Other Ambulatory Visit: Payer: Self-pay | Admitting: Adult Health

## 2018-03-08 ENCOUNTER — Other Ambulatory Visit: Payer: Self-pay | Admitting: Gastroenterology

## 2018-03-22 ENCOUNTER — Telehealth: Payer: Self-pay

## 2018-03-22 NOTE — Telephone Encounter (Signed)
Copied from CRM [id number]. Topic: Referral - Question >> Mar 22, 2018 10:57 AM Windy KalataMichael, Taylor L, NT wrote: Reason for CRM: patient is calling and [facility] and requesting a referral to Behavioral Health to see a Psychiatrist.

## 2018-03-23 ENCOUNTER — Other Ambulatory Visit: Payer: Self-pay | Admitting: Adult Health

## 2018-03-23 DIAGNOSIS — F319 Bipolar disorder, unspecified: Secondary | ICD-10-CM

## 2018-03-23 NOTE — Telephone Encounter (Signed)
[facility]

## 2018-03-23 NOTE — Telephone Encounter (Signed)
Noted  

## 2018-04-01 ENCOUNTER — Ambulatory Visit: Payer: No Typology Code available for payment source | Admitting: Adult Health

## 2018-05-13 ENCOUNTER — Ambulatory Visit (HOSPITAL_COMMUNITY): Payer: Self-pay | Admitting: Psychiatry

## 2018-05-21 ENCOUNTER — Ambulatory Visit (HOSPITAL_COMMUNITY): Payer: Self-pay | Admitting: Psychiatry

## 2018-05-26 ENCOUNTER — Ambulatory Visit (HOSPITAL_COMMUNITY): Payer: Self-pay | Admitting: Psychiatry

## 2018-06-09 ENCOUNTER — Ambulatory Visit (HOSPITAL_COMMUNITY): Payer: Medicaid Other | Admitting: Psychiatry

## 2018-06-28 ENCOUNTER — Ambulatory Visit (HOSPITAL_COMMUNITY): Payer: Medicaid Other | Admitting: Psychiatry

## 2018-07-01 ENCOUNTER — Ambulatory Visit (HOSPITAL_COMMUNITY): Payer: Medicaid Other | Admitting: Psychiatry

## 2018-07-05 ENCOUNTER — Ambulatory Visit (HOSPITAL_COMMUNITY): Payer: Medicaid Other | Admitting: Psychiatry

## 2018-07-05 ENCOUNTER — Other Ambulatory Visit: Payer: Self-pay

## 2018-07-05 NOTE — Progress Notes (Unsigned)
Patient ID: Shane Cole., male   DOB: 09/06/1980, 38 y.o.   MRN: [id number] Attempted to reach patient via phone ( x 2/15 minutes apart) for phone based assessment . No answer and no voice mail to leave message .  Sallyanne Havers MD

## 2018-08-11 ENCOUNTER — Encounter: Payer: Self-pay | Admitting: Adult Health

## 2018-08-11 ENCOUNTER — Ambulatory Visit (INDEPENDENT_AMBULATORY_CARE_PROVIDER_SITE_OTHER): Payer: No Typology Code available for payment source | Admitting: Adult Health

## 2018-08-11 ENCOUNTER — Other Ambulatory Visit: Payer: Self-pay

## 2018-08-11 DIAGNOSIS — M79642 Pain in left hand: Secondary | ICD-10-CM | POA: Diagnosis not present

## 2018-08-11 NOTE — Progress Notes (Signed)
Virtual Visit via Telephone Note  I connected with Shane Cole. on 08/11/18 at  1:30 PM EDT by telephone and verified that I am speaking with the correct person using two identifiers.   I discussed the limitations, risks, security and privacy concerns of performing an evaluation and management service by telephone and the availability of in person appointments. I also discussed with the patient that there may be a patient responsible charge related to this service. The patient expressed understanding and agreed to proceed.  Location patient: home Location provider: work or home office Participants present for the call: patient, provider Patient did not have a visit in the prior 7 days to address this/these issue(s).   History of Present Illness: 38 year old male who is being evaluated today via phone visit for the acute complaint of left hand pain.  Reports that Saturday evening he ran into a door frame in a dark room.  He reports feels as though his left middle finger bent backwards.  He reports dark bruising from the middle knuckle to the distal part of the hand.  Is limited range of motion in the left middle finger.  Does have full range of motion in the left pointer as well as left ring finger.  Denies seeing any deformity  He has been buddy taping middle finger to finger but continues to have pain, bruising ,and swelling  He has been using prescribed oxycodone and Motrin for pain relief   Observations/Objective: Patient sounds cheerful and well on the phone. I do not appreciate any SOB. Speech and thought processing are grossly intact. Patient reported vitals:  Assessment and Plan: 1. Left hand pain - DG Hand Complete Left; Future - Continue to buddy tape - Ice for 20 minutes at a time throughout the day   Follow Up Instructions:  I did not refer this patient for an OV in the next 24 hours for this/these issue(s).  I discussed the assessment and treatment plan with  the patient. The patient was provided an opportunity to ask questions and all were answered. The patient agreed with the plan and demonstrated an understanding of the instructions.   The patient was advised to call back or seek an in-person evaluation if the symptoms worsen or if the condition fails to improve as anticipated  I provided 22 minutes of non-face-to-face time during this encounter.   Shirline Frees, NP

## 2018-08-12 ENCOUNTER — Ambulatory Visit (INDEPENDENT_AMBULATORY_CARE_PROVIDER_SITE_OTHER): Payer: No Typology Code available for payment source

## 2018-08-12 ENCOUNTER — Telehealth: Payer: Self-pay | Admitting: *Deleted

## 2018-08-12 DIAGNOSIS — M79642 Pain in left hand: Secondary | ICD-10-CM

## 2018-08-12 IMAGING — DX LEFT HAND - COMPLETE 3+ VIEW
3 series · 3 of 3 positions shown · non-contrast
Comparison: None.

CLINICAL DATA: Status post fall hit left hand with left hand pain.

EXAM:
LEFT HAND - COMPLETE 3+ VIEW

[hand pa]
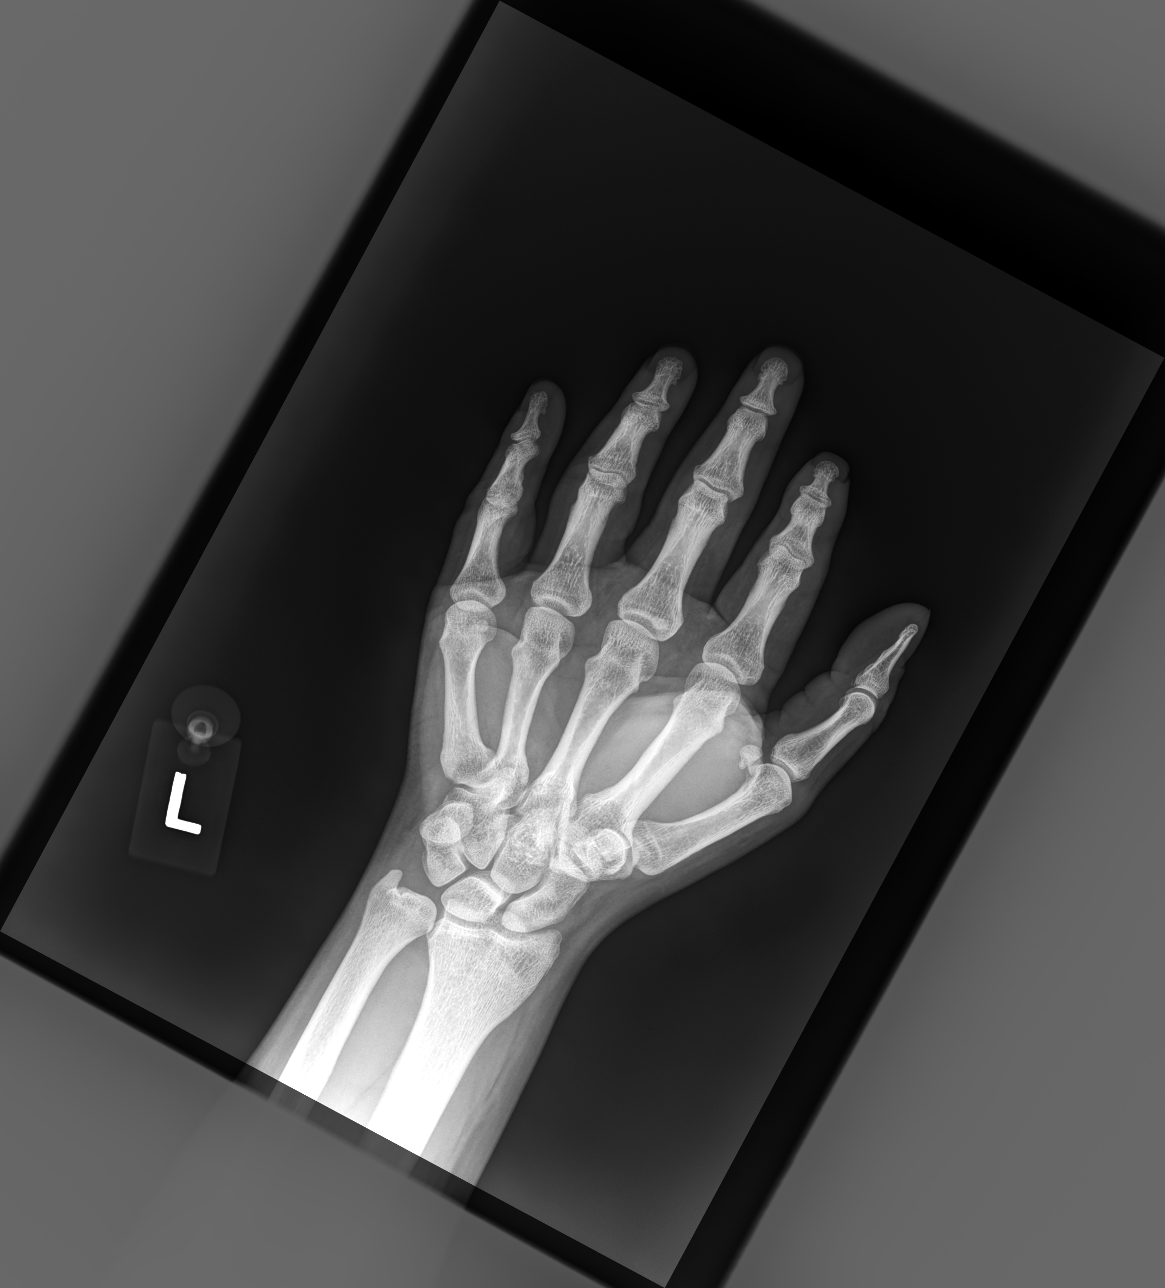

[hand mlo]
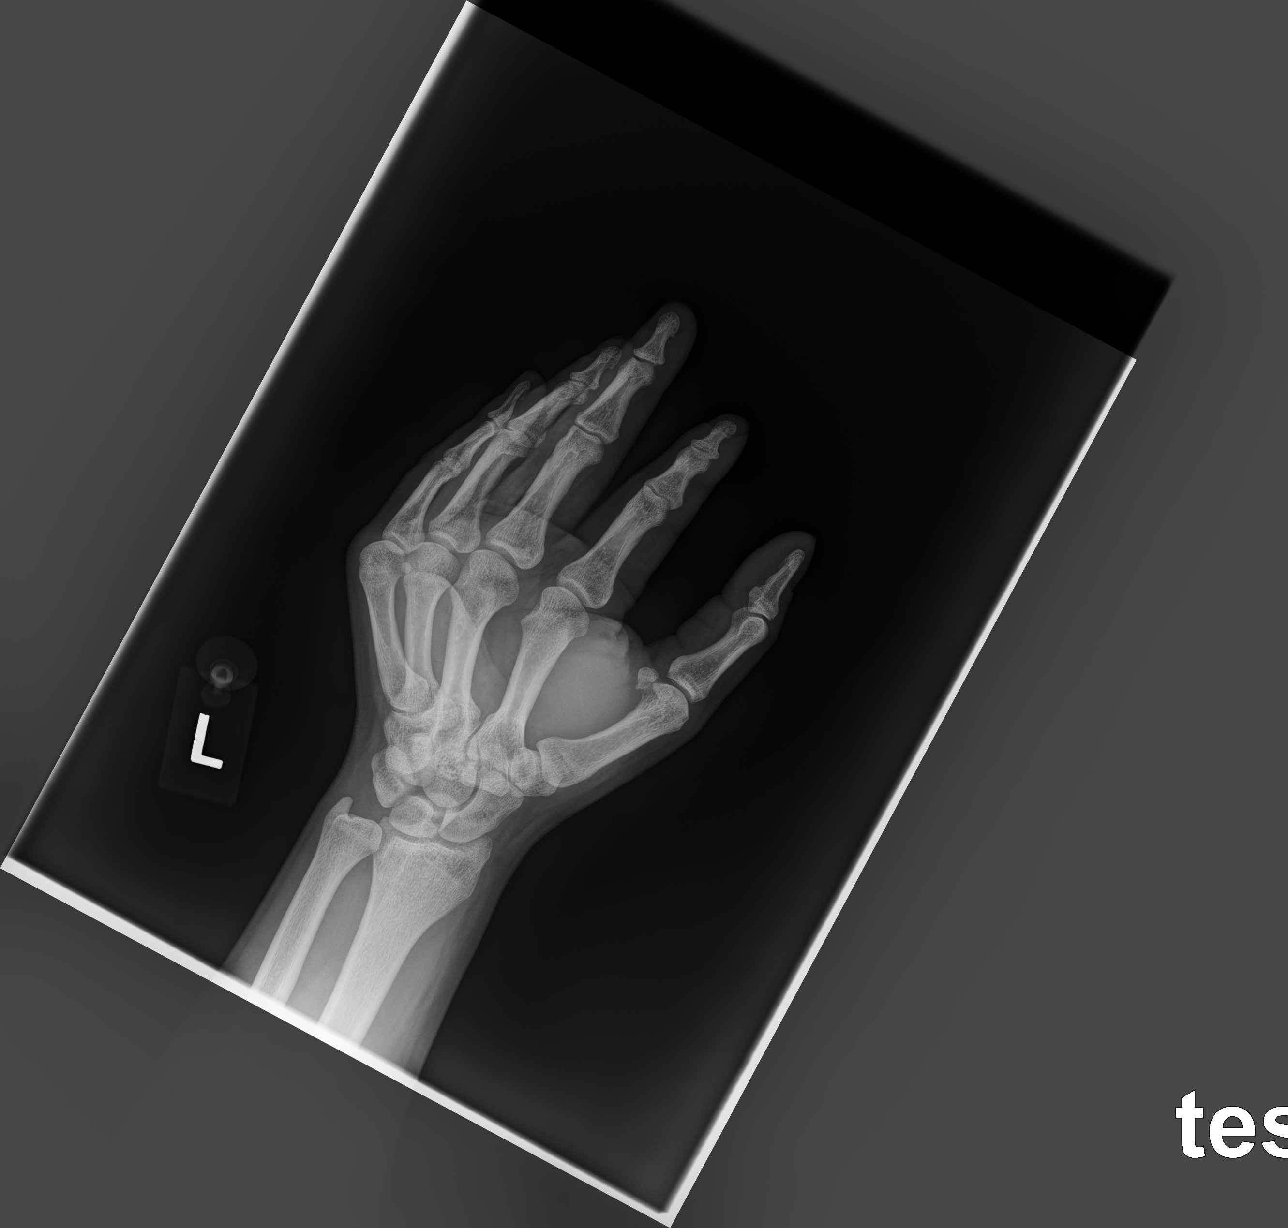

[hand lat]
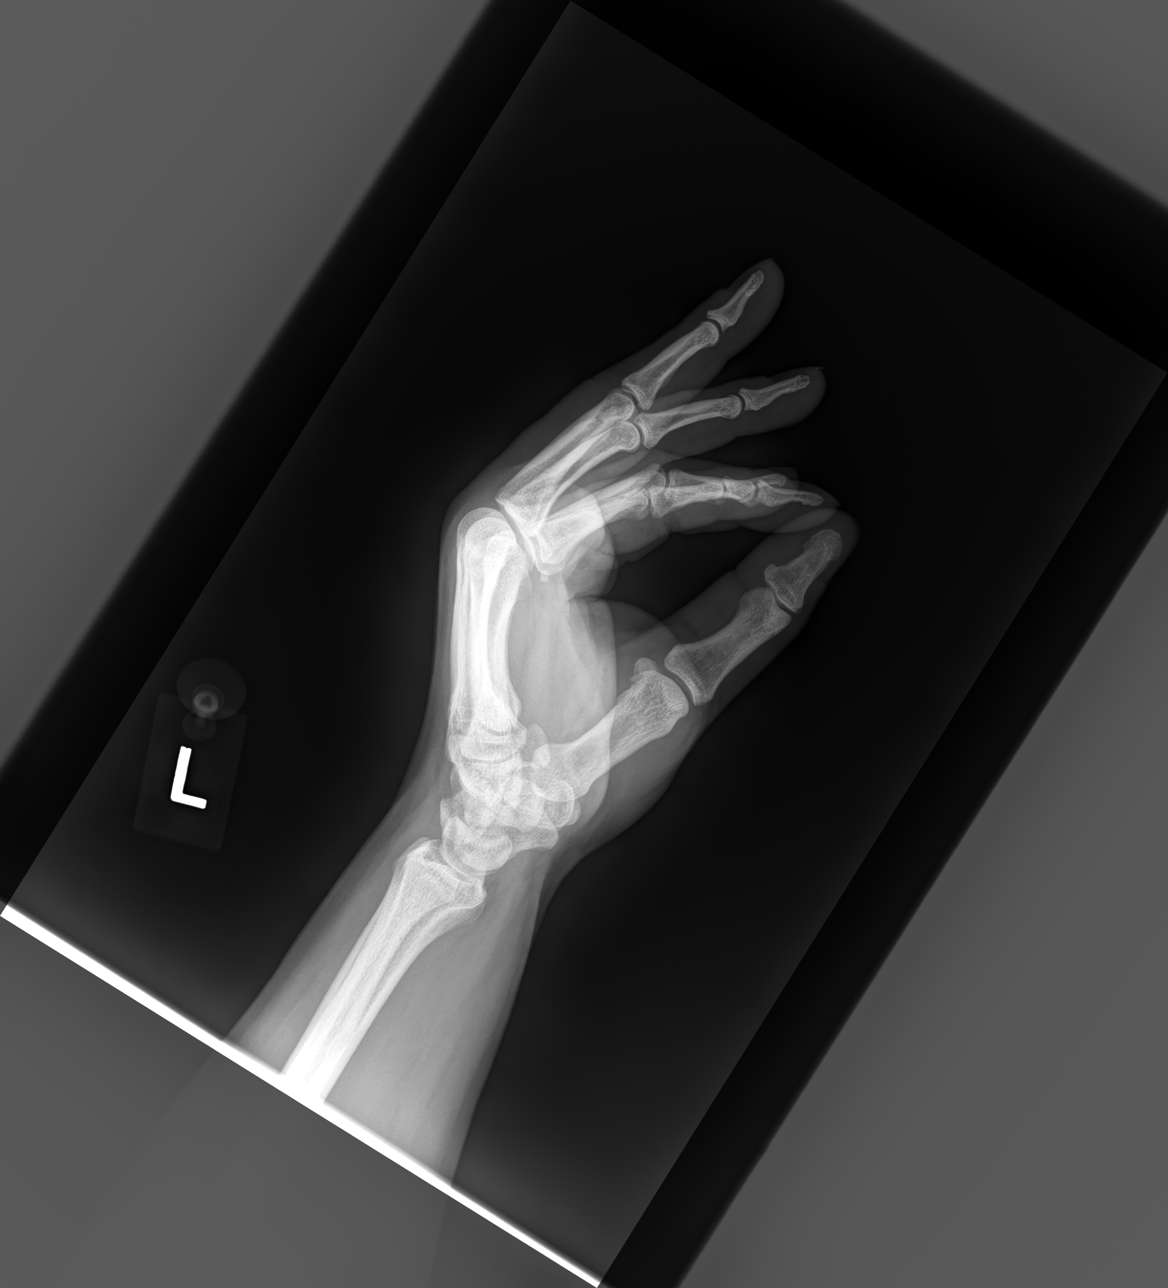

[3 of 3 positions shown; findings below may reference images not displayed]

FINDINGS: There is no evidence of fracture or dislocation. There is no
evidence of arthropathy or other focal bone abnormality. Soft
tissues are unremarkable.
IMPRESSION: Negative.

## 2018-08-12 NOTE — Telephone Encounter (Signed)
Copied from CRM [id number]. Topic: General - Call Back - No Documentation >> Aug 12, 2018 10:45 AM Randol Kern wrote: Reason for CRM: Pt called inquiring about Xray results. He is unable to log in to [facility] and declined offer to help him log in. Made aware that providers will call back when they are able. Please advise

## 2018-08-12 NOTE — Telephone Encounter (Signed)
Noted  

## 2018-08-23 ENCOUNTER — Telehealth: Payer: Self-pay | Admitting: Gastroenterology

## 2018-08-23 ENCOUNTER — Encounter: Payer: Self-pay | Admitting: Gastroenterology

## 2018-08-23 ENCOUNTER — Telehealth: Payer: Self-pay | Admitting: *Deleted

## 2018-08-23 ENCOUNTER — Ambulatory Visit (INDEPENDENT_AMBULATORY_CARE_PROVIDER_SITE_OTHER): Payer: No Typology Code available for payment source | Admitting: Gastroenterology

## 2018-08-23 ENCOUNTER — Other Ambulatory Visit: Payer: Self-pay

## 2018-08-23 ENCOUNTER — Other Ambulatory Visit: Payer: Self-pay | Admitting: Gastroenterology

## 2018-08-23 VITALS — Ht 71.0 in | Wt 228.0 lb

## 2018-08-23 DIAGNOSIS — R197 Diarrhea, unspecified: Secondary | ICD-10-CM

## 2018-08-23 DIAGNOSIS — R109 Unspecified abdominal pain: Secondary | ICD-10-CM | POA: Diagnosis not present

## 2018-08-23 DIAGNOSIS — R110 Nausea: Secondary | ICD-10-CM | POA: Diagnosis not present

## 2018-08-23 MED ORDER — ONDANSETRON 4 MG PO TBDP: 4.0000 mg | ORAL_TABLET | Freq: Three times a day (TID) | ORAL | 2 refills | Status: DC | PRN

## 2018-08-23 NOTE — Patient Instructions (Addendum)
Labs today or tomorrow, he needs to know what the protocol will be if he has kids with him about coming into the building.  CBC, complete metabolic profile, TTG and total IgA level, sed rate, lipase and amylase.  Stool testing for GI pathogen panel, fecal leukocytes, qualitative fecal fat test.  This is for abdominal pain, vomiting, diarrhea.  Thank you for entrusting me with your care and choosing [facility].  Dr Christella Hartigan

## 2018-08-23 NOTE — Telephone Encounter (Signed)
Pt called for refills.

## 2018-08-23 NOTE — Telephone Encounter (Signed)
Copied from CRM [id number]. Topic: Referral - Request for Referral >> Aug 23, 2018 11:20 AM [name] wrote: Has patient seen PCP for this complaint? Yes  Referral for which specialty:Gastroenterology Preferred provider/office: Dr Christella Hartigan.Pt has seen this doctor previously but needs referral from primary doctor because of new insurance. He has an appointment today 08/23/18 Reason for referral: Abdominal pain

## 2018-08-23 NOTE — Telephone Encounter (Signed)
Spoke to patient sent Zofran 4 mg to pharmacy

## 2018-08-23 NOTE — Progress Notes (Signed)
This service was provided via virtual visit.  We tried to connect via audiovisual app however he could not make his end work and so we went with audio only the patient was located at home.  I was located in my office.  The patient did consent to this virtual visit and is aware of possible charges through their insurance for this visit.  The patient is an established patient.  My certified medical assistant, Barbaraann RondoKelly Tadros, contributed to this visit by contacting the patient by phone 1 or 2 business days prior to the appointment and also followed up on the recommendations I made after the visit.  Time spent on virtual visit: 19 minutes   HPI: This is a pleasant 38 year old man whom I last saw the time of colonoscopy and upper endoscopy.  To those results summarized below.  EGD November 2015 with findings of small mild erosive esophagitis.  EGD 02/2017 for dysphasia found LA grade B esophagitis, small hiatal hernia, mild gastritis.   He was recommended to continue taking his Dexilant shortly before breakfast and H2 blocker at bedtime every night.  Colonoscopy November 2015 for eval of diarrhea which was normal except for a few diverticula.  Colonoscopy December 2018 for minor rectal bleeding, chronic right lower quadrant pain; terminal ileum was normal, colonic mucosa was normal.  I suspect that his bleeding was from minor intermittent hemorrhoid disease   He tells me for 8 years he has had problems with intermittent bouts of GI illness.  These are characterized by foul-smelling gas, lower abdominal pain, nausea, vomiting, loss of appetite.  He says it will burn when he eats during these episodes.  The episodes can last about a week and he gets 2 or 3 of them per year.  His current bout is worse than usual.     CT scan abdomen pelvis with IV and oral contrast April 2019 done for nausea vomiting diarrhea showed sigmoid diverticulosis without acute diverticulitis.  Mild hepatic steatosis.  Status  post cholecystectomy.  Barium esophagram November 2019 ordered for dysphasia was normal  Chief complaint is nausea, vomiting, diarrhea, abdominal pain  ROS: complete GI ROS as described in HPI, all other review negative.  Constitutional:  No unintentional weight loss   Past Medical History:  Diagnosis Date  . Allergy   . Anxiety   . Bipolar disorder (HCC)   . Bronchitis   . Crush injury lower leg    Left lower leg  . Depression   . Gastric ulcer   . GERD (gastroesophageal reflux disease)    will awaken him in night  . Migraine   . Nerve pain   . RSD (reflex sympathetic dystrophy)     Past Surgical History:  Procedure Laterality Date  . CHOLECYSTECTOMY N/A 01/24/2014   Procedure: LAPAROSCOPIC CHOLECYSTECTOMY;  Surgeon: Axel FillerArmando Ramirez, MD;  Location: [facility] OR;  Service: General;  Laterality: N/A;  . COLONOSCOPY    . HAND SURGERY    . MASS EXCISION Left 01/25/2013   Procedure: EXCISION MASS DORSAL ASPECT LEFT LONG FINGER DISTAL INTERPHALANGEAL JOINT;  Surgeon: Wyn Forsterobert V Sypher Jr., MD;  Location: [facility];  Service: Orthopedics;  Laterality: Left;  Left long   . MOUTH SURGERY      Current Outpatient Medications  Medication Sig Dispense Refill  . acetaminophen (TYLENOL) 500 MG tablet Take 1,000-1,500 mg by mouth every 6 (six) hours as needed for moderate pain.    . clonazePAM (KLONOPIN) 1 MG tablet Take 1 tablet by  mouth at bedtime.  2  . famotidine (PEPCID) 20 MG tablet Take 1 tablet (20 mg total) by mouth at bedtime. 30 tablet 11  . gabapentin (NEURONTIN) 300 MG capsule Take 1 capsule (300 mg total) by mouth 3 (three) times daily. 270 capsule 3  . ibuprofen (ADVIL,MOTRIN) 800 MG tablet   3  . lidocaine (LINDAMANTLE) 3 % CREA cream Apply 1 application topically daily as needed (chronic pain).   3  . Multiple Vitamin (MULTIVITAMIN WITH MINERALS) TABS tablet Take 1 tablet by mouth daily.    . Nerve Stimulator (TENS THERAPY PAIN RELIEF) DEVI 1 application by  Does not apply route daily as needed (chronic pain).    . nortriptyline (PAMELOR) 75 MG capsule Take 1 capsule (75 mg total) by mouth at bedtime. 90 capsule 1  . omeprazole (PRILOSEC) 40 MG capsule Take 1 capsule (40 mg total) by mouth daily. 30 capsule 11  . ondansetron (ZOFRAN ODT) 4 MG disintegrating tablet Take 1 tablet (4 mg total) by mouth every 8 (eight) hours as needed for nausea or vomiting. 20 tablet 2  . Oxycodone HCl 10 MG TABS   0  . Pseudoephedrine HCl (SUDAFED PO) Take 1 tablet by mouth daily.    . sildenafil (REVATIO) 20 MG tablet TAKE 1 TABLET BY MOUTH TWICE DAILY 60 tablet 6  . sildenafil (VIAGRA) 25 MG tablet Take 25 mg by mouth daily as needed (High BP).    Marland Kitchen tiZANidine (ZANAFLEX) 4 MG tablet Take 1 tablet by mouth 3 (three) times daily as needed.  6  . varenicline (CHANTIX STARTING MONTH PAK) 0.5 MG X 11 & 1 MG X 42 tablet Take one 0.5 mg tablet by mouth once daily for 3 days, then increase to one 0.5 mg tablet twice daily for 4 days, then increase to one 1 mg tablet twice daily. 53 tablet 0   Current Facility-Administered Medications  Medication Dose Route Frequency Provider Last Rate Last Dose  . 0.9 %  sodium chloride infusion  500 mL Intravenous Continuous Rachael Fee, MD        Allergies as of 08/23/2018 - Review Complete 08/11/2018  Allergen Reaction Noted  . Aspirin Other (See Comments) and Tinitus 06/05/2011  . Codeine Itching 06/05/2011  . Penicillins Anaphylaxis and Other (See Comments) 06/05/2011  . Tramadol  06/05/2011  . Amitriptyline Rash 01/25/2013    Family History  Problem Relation Age of Onset  . Hyperlipidemia Father   . Hypertension Father   . Anxiety disorder Father   . Drug abuse Father   . Cancer Paternal Grandfather        lung, colon  . Colon cancer Paternal Grandfather   . Lung cancer Paternal Grandfather   . Diabetes Paternal Grandmother   . Cancer Paternal Grandmother   . Cancer Maternal Grandmother   . Cancer Maternal  Grandfather   . Lung cancer Maternal Grandfather   . Stroke Unknown   . Heart disease Unknown   . Depression Paternal Aunt   . Anxiety disorder Paternal Aunt   . Drug abuse Paternal Uncle     Social History   Socioeconomic History  . Marital status: Married    Spouse name: Not on file  . Number of children: 1  . Years of education: 10th grade  . Highest education level: Not on file  Occupational History  . Occupation: Disabled    Comment: Previously worked Herbalist  . Financial resource strain: Not on file  .  Food insecurity:    Worry: Not on file    Inability: Not on file  . Transportation needs:    Medical: Not on file    Non-medical: Not on file  Tobacco Use  . Smoking status: Current Every Day Smoker    Packs/day: 1.00    Years: 23.00    Pack years: 23.00    Types: Cigarettes, E-cigarettes  . Smokeless tobacco: Current User  Substance and Sexual Activity  . Alcohol use: Yes    Alcohol/week: 6.0 standard drinks    Types: 6 Cans of beer per week    Comment: occasional  . Drug use: No  . Sexual activity: Yes    Partners: Female  Lifestyle  . Physical activity:    Days per week: Not on file    Minutes per session: Not on file  . Stress: Not on file  Relationships  . Social connections:    Talks on phone: Not on file    Gets together: Not on file    Attends religious service: Not on file    Active member of club or organization: Not on file    Attends meetings of clubs or organizations: Not on file    Relationship status: Not on file  . Intimate partner violence:    Fear of current or ex partner: Not on file    Emotionally abused: Not on file    Physically abused: Not on file    Forced sexual activity: Not on file  Other Topics Concern  . Not on file  Social History Narrative   05/24/12  Anthonymichael was born and grew up in [place], [place].    He has 2 sisters.    He reports that his childhood was "lousy."    He  completed the 10th grade.    He has been married for 5 years, and is currently separated for 3 weeks.    He has a daughter who is 59-1/2 years old.    He has been unemployed for 2 years, and is disabled due to an on-the-job work accident.    He is currently living with his aunt and cousin.    He reports that his hobbies are sports and dogs.    He reports that he is spiritual, but not religious.    He states that his aunt and his father are his social support system.    He denies any current legal problems, but got a DUI in 2007. 05/24/12 [facility]        Physical Exam: Unable to perform because this was a "telemed visit" due to current Covid-19 pandemic  Assessment and plan: 38 y.o. male with nausea, vomiting, diarrhea, abdominal pain  He has episodes that involve upper and lower GI symptoms.  These episodes last about a week and they occur 2 or 3 times a year.  He has had numerous tests for them without conclusive etiology.  He is just finishing about now and I recommended that he come in for blood testing and stool testing.  See the exact tests and patient instructions.  I am most suspicious that this is functional related given extensive and negative work-up to date.  Please see the "Patient Instructions" section for addition details about the plan.  Rob Bunting, MD  Gastroenterology 08/23/2018, 2:24 PM

## 2018-08-24 ENCOUNTER — Ambulatory Visit (HOSPITAL_COMMUNITY): Payer: Medicaid Other | Admitting: Psychiatry

## 2018-08-24 ENCOUNTER — Other Ambulatory Visit (INDEPENDENT_AMBULATORY_CARE_PROVIDER_SITE_OTHER): Payer: No Typology Code available for payment source

## 2018-08-24 ENCOUNTER — Other Ambulatory Visit: Payer: Self-pay

## 2018-08-24 ENCOUNTER — Ambulatory Visit (INDEPENDENT_AMBULATORY_CARE_PROVIDER_SITE_OTHER): Payer: Self-pay | Admitting: Psychiatry

## 2018-08-24 ENCOUNTER — Other Ambulatory Visit: Payer: Self-pay | Admitting: Family Medicine

## 2018-08-24 DIAGNOSIS — R109 Unspecified abdominal pain: Secondary | ICD-10-CM | POA: Diagnosis not present

## 2018-08-24 DIAGNOSIS — R197 Diarrhea, unspecified: Secondary | ICD-10-CM | POA: Diagnosis not present

## 2018-08-24 DIAGNOSIS — R110 Nausea: Secondary | ICD-10-CM

## 2018-08-24 DIAGNOSIS — F3341 Major depressive disorder, recurrent, in partial remission: Secondary | ICD-10-CM

## 2018-08-24 LAB — COMPREHENSIVE METABOLIC PANEL
ALT: 29 U/L (ref 0–53)
AST: 14 U/L (ref 0–37)
Albumin: 4.5 g/dL (ref 3.5–5.2)
Alkaline Phosphatase: 77 U/L (ref 39–117)
BUN: 18 mg/dL (ref 6–23)
CO2: 26 mEq/L (ref 19–32)
Calcium: 9.6 mg/dL (ref 8.4–10.5)
Chloride: 105 mEq/L (ref 96–112)
Creatinine, Ser: 0.98 mg/dL (ref 0.40–1.50)
GFR: 85.15 mL/min (ref >60.00)
Glucose, Bld: 90 mg/dL (ref 70–99)
Potassium: 3.9 mEq/L (ref 3.5–5.1)
Sodium: 141 mEq/L (ref 135–145)
Total Bilirubin: 0.2 mg/dL (ref 0.2–1.2)
Total Protein: 7.2 g/dL (ref 6.0–8.3)

## 2018-08-24 LAB — CBC WITH DIFFERENTIAL/PLATELET
Basophils Absolute: 0.1 10*3/uL (ref 0.0–0.1)
Basophils Relative: 0.6 % (ref 0.0–3.0)
Eosinophils Absolute: 1.4 10*3/uL — ABNORMAL HIGH (ref 0.0–0.7)
Eosinophils Relative: 16 % — ABNORMAL HIGH (ref 0.0–5.0)
HCT: 45.7 % (ref 39.0–52.0)
Hemoglobin: 15.7 g/dL (ref 13.0–17.0)
Lymphocytes Relative: 27.8 % (ref 12.0–46.0)
Lymphs Abs: 2.5 10*3/uL (ref 0.7–4.0)
MCHC: 34.4 g/dL (ref 30.0–36.0)
MCV: 93.1 fl (ref 78.0–100.0)
Monocytes Absolute: 0.7 10*3/uL (ref 0.1–1.0)
Monocytes Relative: 7.3 % (ref 3.0–12.0)
Neutro Abs: 4.4 10*3/uL (ref 1.4–7.7)
Neutrophils Relative %: 48.3 % (ref 43.0–77.0)
Platelets: 248 10*3/uL (ref 150.0–400.0)
RBC: 4.91 Mil/uL (ref 4.22–5.81)
RDW: 13.2 % (ref 11.5–15.5)
WBC: 9.1 10*3/uL (ref 4.0–10.5)

## 2018-08-24 LAB — AMYLASE: Amylase: 34 U/L (ref 27–131)

## 2018-08-24 LAB — LIPASE: Lipase: 16 U/L (ref 11.0–59.0)

## 2018-08-24 LAB — IGA: IgA: 136 mg/dL (ref 68–378)

## 2018-08-24 LAB — SEDIMENTATION RATE: Sed Rate: 14 mm/hr (ref 0–15)

## 2018-08-24 NOTE — Telephone Encounter (Signed)
Referral placed.

## 2018-08-24 NOTE — Addendum Note (Signed)
Addended by: Waldemar Dickens B on: 08/24/2018 08:15 AM   Modules accepted: Orders
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
## 2018-08-25 LAB — TISSUE TRANSGLUTAMINASE, IGA: (tTG) Ab, IgA: 1 U/mL

## 2018-08-31 ENCOUNTER — Other Ambulatory Visit: Payer: Self-pay

## 2018-08-31 ENCOUNTER — Telehealth: Payer: Self-pay | Admitting: Gastroenterology

## 2018-08-31 DIAGNOSIS — D721 Eosinophilia, unspecified: Secondary | ICD-10-CM

## 2018-08-31 NOTE — Telephone Encounter (Signed)
Patient calling to know his labs results.

## 2018-08-31 NOTE — Telephone Encounter (Signed)
Thanks, see results note that I just sent to you.

## 2018-08-31 NOTE — Telephone Encounter (Signed)
Patient calling for lab results from last week.  Please review and advise

## 2018-08-31 NOTE — Telephone Encounter (Signed)
See lab results notes not additional details.

## 2018-09-02 ENCOUNTER — Other Ambulatory Visit: Payer: Self-pay | Admitting: Adult Health

## 2018-09-02 ENCOUNTER — Ambulatory Visit: Payer: Self-pay

## 2018-09-02 ENCOUNTER — Telehealth: Payer: Self-pay | Admitting: Adult Health

## 2018-09-02 DIAGNOSIS — L559 Sunburn, unspecified: Secondary | ICD-10-CM

## 2018-09-02 NOTE — Telephone Encounter (Signed)
Pt wanted to know if he needs more blood work.  He does not remember.
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
## 2018-09-02 NOTE — Telephone Encounter (Signed)
Referral placed.

## 2018-09-02 NOTE — Telephone Encounter (Signed)
Patient notified he is due for labs.

## 2018-09-02 NOTE — Telephone Encounter (Signed)
Pt called stating he had a really bad sunburn.  He has loose skin on his back from broken blisters he would like to pull off. Pt was advised to not pull this skin. He states he has an appointment with dermatology tomorrow. Pt was encouraged to keep that appointment and call us back if follow up needed.   Reason for Disposition . General information question, no triage required and triager able to answer question  Answer Assessment - Initial Assessment Questions 1. REASON FOR CALL or QUESTION: "What is your reason for calling today?" or "How can I best help you?" or "What question do you have that I can help answer?"     Pt called stating he had a really bad sunburn.  He has loose skin he would like to pull off  Protocols used: INFORMATION ONLY CALL-A-AH

## 2018-09-03 NOTE — Telephone Encounter (Signed)
Patient informed that per Barb Merino, [facility] that it won't interfere with his blood work.

## 2018-09-03 NOTE — Telephone Encounter (Signed)
Noted  

## 2018-09-03 NOTE — Telephone Encounter (Signed)
Pt stated that PCP prescribed antibiotics for sunburn and would like to know if antibiotics would interfere with blood work.

## 2018-09-08 ENCOUNTER — Other Ambulatory Visit (INDEPENDENT_AMBULATORY_CARE_PROVIDER_SITE_OTHER): Payer: No Typology Code available for payment source

## 2018-09-08 DIAGNOSIS — D721 Eosinophilia, unspecified: Secondary | ICD-10-CM

## 2018-09-08 LAB — CBC WITH DIFFERENTIAL/PLATELET
Basophils Absolute: 0.1 10*3/uL (ref 0.0–0.1)
Basophils Relative: 1 % (ref 0.0–3.0)
Eosinophils Absolute: 1 10*3/uL — ABNORMAL HIGH (ref 0.0–0.7)
Eosinophils Relative: 10.9 % — ABNORMAL HIGH (ref 0.0–5.0)
HCT: 48.3 % (ref 39.0–52.0)
Hemoglobin: 16.3 g/dL (ref 13.0–17.0)
Lymphocytes Relative: 27.3 % (ref 12.0–46.0)
Lymphs Abs: 2.5 10*3/uL (ref 0.7–4.0)
MCHC: 33.7 g/dL (ref 30.0–36.0)
MCV: 93.5 fl (ref 78.0–100.0)
Monocytes Absolute: 0.7 10*3/uL (ref 0.1–1.0)
Monocytes Relative: 7.6 % (ref 3.0–12.0)
Neutro Abs: 4.8 10*3/uL (ref 1.4–7.7)
Neutrophils Relative %: 53.2 % (ref 43.0–77.0)
Platelets: 333 10*3/uL (ref 150.0–400.0)
RBC: 5.16 Mil/uL (ref 4.22–5.81)
RDW: 13.2 % (ref 11.5–15.5)
WBC: 9.1 10*3/uL (ref 4.0–10.5)

## 2018-09-09 ENCOUNTER — Telehealth: Payer: Self-pay | Admitting: Gastroenterology

## 2018-09-09 DIAGNOSIS — D721 Eosinophilia, unspecified: Secondary | ICD-10-CM

## 2018-09-09 DIAGNOSIS — R109 Unspecified abdominal pain: Secondary | ICD-10-CM

## 2018-09-09 DIAGNOSIS — R197 Diarrhea, unspecified: Secondary | ICD-10-CM

## 2018-09-09 NOTE — Telephone Encounter (Signed)
Pt complains that he is having a "flare" of his symptoms including abd pain on right side radiating to the back that is a "stabbing pain".  No appetite.  Reflux is worse.  Nausea and vomiting. No fever but states he is cold and clammy.  Please advise

## 2018-09-09 NOTE — Telephone Encounter (Signed)
Returned pt call no answer and no voice mail

## 2018-09-09 NOTE — Telephone Encounter (Signed)
Patient called said that he is having a flare up he is feeling abd pain on the right of the belly button and goes to the back. extremely nausea, diarrhea and no sleep he said no fever but sweaty and clammy

## 2018-09-09 NOTE — Telephone Encounter (Signed)
Per Dr Ardis Hughs VO pt needs to have ova and parasite due to elevated eosinophils possibly due to parasite?? The patient has been notified of this information and all questions answered.

## 2018-09-15 ENCOUNTER — Other Ambulatory Visit: Payer: No Typology Code available for payment source

## 2018-09-15 DIAGNOSIS — R197 Diarrhea, unspecified: Secondary | ICD-10-CM

## 2018-09-15 DIAGNOSIS — D721 Eosinophilia, unspecified: Secondary | ICD-10-CM

## 2018-09-15 DIAGNOSIS — R109 Unspecified abdominal pain: Secondary | ICD-10-CM

## 2018-09-16 ENCOUNTER — Telehealth: Payer: Self-pay | Admitting: Gastroenterology

## 2018-09-16 NOTE — Telephone Encounter (Signed)
The pt has been advised that results would take several days.  He will be called as soon as available.

## 2018-09-16 NOTE — Telephone Encounter (Signed)
Pt requested a call back to discuss test results.  °

## 2018-09-27 ENCOUNTER — Telehealth: Payer: Self-pay | Admitting: *Deleted

## 2018-09-27 LAB — OVA AND PARASITE EXAMINATION
CONCENTRATE RESULT:: NONE SEEN
MICRO NUMBER:: 602850
SPECIMEN QUALITY:: ADEQUATE
TRICHROME RESULT:: NONE SEEN

## 2018-09-27 NOTE — Telephone Encounter (Signed)
Attempted to call the patient to scheduled EGD. Unable to leave message, VM not currently set up.

## 2018-09-29 ENCOUNTER — Encounter: Payer: Self-pay | Admitting: Gastroenterology

## 2018-10-08 ENCOUNTER — Other Ambulatory Visit: Payer: Self-pay

## 2018-10-08 ENCOUNTER — Ambulatory Visit (AMBULATORY_SURGERY_CENTER): Payer: Self-pay

## 2018-10-08 VITALS — Ht 72.0 in | Wt 223.0 lb

## 2018-10-08 DIAGNOSIS — R110 Nausea: Secondary | ICD-10-CM

## 2018-10-08 DIAGNOSIS — R109 Unspecified abdominal pain: Secondary | ICD-10-CM

## 2018-10-08 NOTE — Progress Notes (Signed)
Denies allergies to eggs or soy products. Denies complication of anesthesia or sedation. Denies use of weight loss medication. Denies use of O2.   Emmi instructions given endoscopy   Pre-Visit was conducted by phone due to Covid 19. Instructions were reviewed and mailed to patients confirmed home address. Patient was encouraged to call if he had any questions regarding instructions.

## 2018-10-15 ENCOUNTER — Telehealth: Payer: Self-pay | Admitting: Gastroenterology

## 2018-10-15 NOTE — Telephone Encounter (Signed)

## 2018-10-18 ENCOUNTER — Ambulatory Visit (AMBULATORY_SURGERY_CENTER): Payer: No Typology Code available for payment source | Admitting: Gastroenterology

## 2018-10-18 ENCOUNTER — Other Ambulatory Visit: Payer: Self-pay

## 2018-10-18 ENCOUNTER — Encounter: Payer: Self-pay | Admitting: Gastroenterology

## 2018-10-18 VITALS — BP 119/88 | HR 89 | Temp 98.8°F | Resp 21 | Ht 72.0 in | Wt 223.0 lb

## 2018-10-18 DIAGNOSIS — K2970 Gastritis, unspecified, without bleeding: Secondary | ICD-10-CM

## 2018-10-18 DIAGNOSIS — K449 Diaphragmatic hernia without obstruction or gangrene: Secondary | ICD-10-CM | POA: Diagnosis not present

## 2018-10-18 DIAGNOSIS — K2990 Gastroduodenitis, unspecified, without bleeding: Secondary | ICD-10-CM

## 2018-10-18 DIAGNOSIS — K3189 Other diseases of stomach and duodenum: Secondary | ICD-10-CM

## 2018-10-18 DIAGNOSIS — R110 Nausea: Secondary | ICD-10-CM

## 2018-10-18 MED ORDER — SODIUM CHLORIDE 0.9 % IV SOLN: 500.0000 mL | Freq: Once | INTRAVENOUS | Status: DC

## 2018-10-18 NOTE — Progress Notes (Signed)
Report to PACU, RN, vss, BBS= Clear.  

## 2018-10-18 NOTE — Patient Instructions (Signed)
Handouts given for gastritis and hiatal hernia.  YOU HAD AN ENDOSCOPIC PROCEDURE TODAY AT THE [facility]:   Refer to the procedure report that was given to you for any specific questions about what was found during the examination.  If the procedure report does not answer your questions, please call your gastroenterologist to clarify.  If you requested that your care partner not be given the details of your procedure findings, then the procedure report has been included in a sealed envelope for you to review at your convenience later.  YOU SHOULD EXPECT: Some feelings of bloating in the abdomen. Passage of more gas than usual.  Walking can help get rid of the air that was put into your GI tract during the procedure and reduce the bloating. If you had a lower endoscopy (such as a colonoscopy or flexible sigmoidoscopy) you may notice spotting of blood in your stool or on the toilet paper. If you underwent a bowel prep for your procedure, you may not have a normal bowel movement for a few days.  Please Note:  You might notice some irritation and congestion in your nose or some drainage.  This is from the oxygen used during your procedure.  There is no need for concern and it should clear up in a day or so.  SYMPTOMS TO REPORT IMMEDIATELY:   Following upper endoscopy (EGD)  Vomiting of blood or coffee ground material  New chest pain or pain under the shoulder blades  Painful or persistently difficult swallowing  New shortness of breath  Fever of 100F or higher  Black, tarry-looking stools  For urgent or emergent issues, a gastroenterologist can be reached at any hour by calling [phone].   DIET:  We do recommend a small meal at first, but then you may proceed to your regular diet.  Drink plenty of fluids but you should avoid alcoholic beverages for 24 hours.  ACTIVITY:  You should plan to take it easy for the rest of today and you should NOT DRIVE or use heavy machinery until  tomorrow (because of the sedation medicines used during the test).    FOLLOW UP: Our staff will call the number listed on your records 48-72 hours following your procedure to check on you and address any questions or concerns that you may have regarding the information given to you following your procedure. If we do not reach you, we will leave a message.  We will attempt to reach you two times.  During this call, we will ask if you have developed any symptoms of COVID 19. If you develop any symptoms (ie: fever, flu-like symptoms, shortness of breath, cough etc.) before then, please call [phone].  If you test positive for Covid 19 in the 2 weeks post procedure, please call and report this information to us.    If any biopsies were taken you will be contacted by phone or by letter within the next 1-3 weeks.  Please call us at [phone] if you have not heard about the biopsies in 3 weeks.    SIGNATURES/CONFIDENTIALITY: You and/or your care partner have signed paperwork which will be entered into your electronic medical record.  These signatures attest to the fact that that the information above on your After Visit Summary has been reviewed and is understood.  Full responsibility of the confidentiality of this discharge information lies with you and/or your care-partner. 

## 2018-10-18 NOTE — Op Note (Signed)
[facility] Patient Name: Shane Cole Procedure Date: 10/18/2018 2:20 PM MRN: [id number] Endoscopist: Rachael Feeaniel P Jaasiel [place] , MD Age: 38 Referring MD:  Date of Birth: 09/19/1980 Gender: Male Account #: [id number] Procedure:                Upper GI endoscopy Indications:              intermittent abdominal pain, nausea, vomiting;                            chronically elevated blood eosinophil level Medicines:                Monitored Anesthesia Care Procedure:                Pre-Anesthesia Assessment:                           - Prior to the procedure, a History and Physical                            was performed, and patient medications and                            allergies were reviewed. The patient's tolerance of                            previous anesthesia was also reviewed. The risks                            and benefits of the procedure and the sedation                            options and risks were discussed with the patient.                            All questions were answered, and informed consent                            was obtained. Prior Anticoagulants: The patient has                            taken no previous anticoagulant or antiplatelet                            agents. ASA Grade Assessment: II - A patient with                            mild systemic disease. After reviewing the risks                            and benefits, the patient was deemed in                            satisfactory condition to undergo the procedure.  After obtaining informed consent, the endoscope was                            passed under direct vision. Throughout the                            procedure, the patient's blood pressure, pulse, and                            oxygen saturations were monitored continuously. The                            Endoscope was introduced through the mouth, and                            advanced to the  second part of duodenum. The upper                            GI endoscopy was accomplished without difficulty.                            The patient tolerated the procedure well. Scope In: Scope Out: Findings:                 A small hiatal hernia was present.                           Mild inflammation characterized by erythema was                            found in the gastric antrum. Biopsies were taken                            with a cold forceps for histology.                           Normal duodenum was also biopsied.                           The exam was otherwise without abnormality. Complications:            No immediate complications. Estimated blood loss:                            None. Estimated Blood Loss:     Estimated blood loss: none. Impression:               - Small hiatal hernia.                           - Mild gastritis, biopsied to check for                            Eosinophilic Gastritis.                           - Normal  duodenum, also biopsied to check for                            Eosinophlic Enteritis.                           - The examination was otherwise normal. Recommendation:           - Patient has a contact number available for                            emergencies. The signs and symptoms of potential                            delayed complications were discussed with the                            patient. Return to normal activities tomorrow.                            Written discharge instructions were provided to the                            patient.                           - Resume previous diet.                           - Continue present medications.                           - Await pathology results. Rachael Feeaniel P Ayeza Therriault, MD 10/18/2018 2:40:50 PM This report has been signed electronically.

## 2018-10-18 NOTE — Progress Notes (Signed)
Called to room to assist during endoscopic procedure.  Patient ID and intended procedure confirmed with present staff. Received instructions for my participation in the procedure from the performing physician.  

## 2018-10-18 NOTE — Progress Notes (Signed)
Pt's states no medical or surgical changes since previsit or office visit. 

## 2018-10-18 NOTE — Progress Notes (Signed)
JM- Temp CW- Vitals 

## 2018-10-20 ENCOUNTER — Telehealth: Payer: Self-pay

## 2018-10-20 NOTE — Telephone Encounter (Signed)
  Follow up Call-  Call back number 10/18/2018 03/16/2017  Post procedure Call Back phone  # [phone]  Permission to leave phone message Yes Yes  Some recent data might be hidden     Voicemail has not been set up

## 2018-10-20 NOTE — Telephone Encounter (Signed)
  Follow up Call-  Call back number 10/18/2018 03/16/2017  Post procedure Call Back phone  # [phone]  Permission to leave phone message Yes Yes  Some recent data might be hidden     Patient questions:  Do you have a fever, pain , or abdominal swelling? No. Pain Score  0 *  Have you tolerated food without any problems? Yes.    Have you been able to return to your normal activities? Yes.    Do you have any questions about your discharge instructions: Diet   No. Medications  No. Follow up visit  No.  Do you have questions or concerns about your Care? No.  Actions: * If pain score is 4 or above: No action needed, pain <4. 1. Have you developed a fever since your procedure? no  2.   Have you had an respiratory symptoms (SOB or cough) since your procedure? no  3.   Have you tested positive for COVID 19 since your procedure no  4.   Have you had any family members/close contacts diagnosed with the COVID 19 since your procedure?  no   If yes to any of these questions please route to Joylene John, RN and Alphonsa Gin, Therapist, sports.

## 2018-10-26 ENCOUNTER — Encounter: Payer: Self-pay | Admitting: Gastroenterology

## 2018-12-01 ENCOUNTER — Other Ambulatory Visit: Payer: Self-pay | Admitting: Adult Health

## 2018-12-03 NOTE — Telephone Encounter (Signed)
Sent to the pharmacy by e-scribe. 

## 2018-12-20 ENCOUNTER — Telehealth: Payer: Self-pay | Admitting: Adult Health

## 2018-12-20 NOTE — Telephone Encounter (Signed)
rx refill ondansetron (ZOFRAN ODT) 4 MG disintegrating tablet [place], [place] - [address] [phone] (Phone) [phone] (Fax)

## 2018-12-21 ENCOUNTER — Ambulatory Visit: Payer: No Typology Code available for payment source

## 2018-12-21 NOTE — Telephone Encounter (Signed)
Denied.  Filled by gastro.  Pt notified by telephone to call Dr. Eugenia Pancoast office for refill.

## 2019-01-25 ENCOUNTER — Encounter: Payer: Self-pay | Admitting: Adult Health

## 2019-01-25 ENCOUNTER — Other Ambulatory Visit: Payer: Self-pay

## 2019-01-25 ENCOUNTER — Ambulatory Visit (INDEPENDENT_AMBULATORY_CARE_PROVIDER_SITE_OTHER): Payer: No Typology Code available for payment source

## 2019-01-25 DIAGNOSIS — Z23 Encounter for immunization: Secondary | ICD-10-CM | POA: Diagnosis not present

## 2019-02-09 ENCOUNTER — Other Ambulatory Visit: Payer: Self-pay

## 2019-02-10 ENCOUNTER — Ambulatory Visit (INDEPENDENT_AMBULATORY_CARE_PROVIDER_SITE_OTHER): Payer: No Typology Code available for payment source | Admitting: Adult Health

## 2019-02-10 ENCOUNTER — Encounter: Payer: Self-pay | Admitting: Adult Health

## 2019-02-10 VITALS — BP 142/84 | Temp 97.2°F | Ht 70.0 in | Wt 217.0 lb

## 2019-02-10 DIAGNOSIS — K219 Gastro-esophageal reflux disease without esophagitis: Secondary | ICD-10-CM | POA: Diagnosis not present

## 2019-02-10 DIAGNOSIS — I10 Essential (primary) hypertension: Secondary | ICD-10-CM

## 2019-02-10 DIAGNOSIS — F17200 Nicotine dependence, unspecified, uncomplicated: Secondary | ICD-10-CM | POA: Diagnosis not present

## 2019-02-10 DIAGNOSIS — G8929 Other chronic pain: Secondary | ICD-10-CM

## 2019-02-10 DIAGNOSIS — Z0000 Encounter for general adult medical examination without abnormal findings: Secondary | ICD-10-CM | POA: Diagnosis not present

## 2019-02-10 DIAGNOSIS — R1013 Epigastric pain: Secondary | ICD-10-CM

## 2019-02-10 LAB — COMPREHENSIVE METABOLIC PANEL
ALT: 35 U/L (ref 0–53)
AST: 15 U/L (ref 0–37)
Albumin: 5.1 g/dL (ref 3.5–5.2)
Alkaline Phosphatase: 75 U/L (ref 39–117)
BUN: 17 mg/dL (ref 6–23)
CO2: 30 mEq/L (ref 19–32)
Calcium: 9.6 mg/dL (ref 8.4–10.5)
Chloride: 100 mEq/L (ref 96–112)
Creatinine, Ser: 0.99 mg/dL (ref 0.40–1.50)
GFR: 83.96 mL/min (ref >60.00)
Glucose, Bld: 82 mg/dL (ref 70–99)
Potassium: 4 mEq/L (ref 3.5–5.1)
Sodium: 141 mEq/L (ref 135–145)
Total Bilirubin: 0.5 mg/dL (ref 0.2–1.2)
Total Protein: 7.4 g/dL (ref 6.0–8.3)

## 2019-02-10 LAB — LIPID PANEL
Cholesterol: 195 mg/dL (ref 0–200)
HDL: 63.8 mg/dL (ref >39.00)
LDL Cholesterol: 113 mg/dL — ABNORMAL HIGH (ref 0–99)
NonHDL: 131.02
Total CHOL/HDL Ratio: 3
Triglycerides: 91 mg/dL (ref 0.0–149.0)
VLDL: 18.2 mg/dL (ref 0.0–40.0)

## 2019-02-10 LAB — CBC WITH DIFFERENTIAL/PLATELET
Basophils Absolute: 0 10*3/uL (ref 0.0–0.1)
Basophils Relative: 0.4 % (ref 0.0–3.0)
Eosinophils Absolute: 0.4 10*3/uL (ref 0.0–0.7)
Eosinophils Relative: 5.2 % — ABNORMAL HIGH (ref 0.0–5.0)
HCT: 45.2 % (ref 39.0–52.0)
Hemoglobin: 15.2 g/dL (ref 13.0–17.0)
Lymphocytes Relative: 28.5 % (ref 12.0–46.0)
Lymphs Abs: 2.5 10*3/uL (ref 0.7–4.0)
MCHC: 33.7 g/dL (ref 30.0–36.0)
MCV: 95.1 fl (ref 78.0–100.0)
Monocytes Absolute: 0.6 10*3/uL (ref 0.1–1.0)
Monocytes Relative: 7.3 % (ref 3.0–12.0)
Neutro Abs: 5 10*3/uL (ref 1.4–7.7)
Neutrophils Relative %: 58.6 % (ref 43.0–77.0)
Platelets: 265 10*3/uL (ref 150.0–400.0)
RBC: 4.75 Mil/uL (ref 4.22–5.81)
RDW: 13.5 % (ref 11.5–15.5)
WBC: 8.6 10*3/uL (ref 4.0–10.5)

## 2019-02-10 MED ORDER — ONDANSETRON 4 MG PO TBDP: 4.0000 mg | ORAL_TABLET | Freq: Three times a day (TID) | ORAL | 2 refills | Status: DC | PRN

## 2019-02-10 NOTE — Progress Notes (Signed)
Subjective:    Patient ID: Shane Cole., male    DOB: January 05, 1981, 38 y.o.   MRN: [id number]  HPI Patient presents for yearly preventative medicine examination. He is a pleasant 38 year old male who  has a past medical history of Allergy, Anxiety, Bipolar disorder (HCC), Bronchitis, Crush injury lower leg, Depression, Gastric ulcer, GERD (gastroesophageal reflux disease), Hypertension, Migraine, Nerve pain, and RSD (reflex sympathetic dystrophy).  GERD -currently prescribed Pepcid 20 mg 3 times daily and prilosec 40 mg daily. Marland Kitchen  He is monitored by gastroenterology. He had an endoscopy done in July 2020 which showed   A small hiatal hernia was present. - Mild inflammation characterized by erythema was found in the gastric antrum. Biopsies were taken with a cold forceps for histology. Normal duodenum was also biopsied. - The exam was otherwise without abnormality.  He reports that he has been having significantly less discomfort in his abdomen.  Hypertension -currently prescribed her Revatio 20 mg twice daily.  This was originally prescribed by his neurologist.  Was kept on it because his blood pressure is well controlled with this medication.  He denies dizziness, lightheadedness, chest pain, or shortness of breath BP Readings from Last 3 Encounters:  02/10/19 (!) 142/84  10/18/18 119/88  01/21/18 122/80   Tobacco Use -he continues to smoke.  Has tried Chantix in the past.  Chronic Pain - s/p crush injury is seen by Pain management.  Currently prescribed Klonopin 1 mg nightly, Neurontin 300 mg 3 times daily, nortriptyline 75 mg nightly and Oxycodone 10 mg.  Every 3 months he receives steroid injections as well.  Feels as though his pain is well controlled  All immunizations and health maintenance protocols were reviewed with the patient and needed orders were placed. Up to date on routine vaccinations   Appropriate screening laboratory values were ordered for the patient  including screening of hyperlipidemia, renal function and hepatic function.  Medication reconciliation,  past medical history, social history, problem list and allergies were reviewed in detail with the patient  Goals were established with regard to weight loss, exercise, and  diet in compliance with medications  End of life planning was discussed.   Review of Systems  Constitutional: Negative.   HENT: Negative.   Eyes: Negative.   Respiratory: Negative.   Cardiovascular: Negative.   Gastrointestinal: Negative.   Endocrine: Negative.   Genitourinary: Negative.   Musculoskeletal: Positive for arthralgias.  Skin: Negative.   Allergic/Immunologic: Negative.   Hematological: Negative.   Psychiatric/Behavioral: Negative.   All other systems reviewed and are negative.  Past Medical History:  Diagnosis Date  . Allergy   . Anxiety   . Bipolar disorder (HCC)   . Bronchitis   . Crush injury lower leg    Left lower leg  . Depression   . Gastric ulcer   . GERD (gastroesophageal reflux disease)    will awaken him in night  . Hypertension   . Migraine   . Nerve pain   . RSD (reflex sympathetic dystrophy)     Social History   Socioeconomic History  . Marital status: Married    Spouse name: Not on file  . Number of children: 1  . Years of education: 10th grade  . Highest education level: Not on file  Occupational History  . Occupation: Disabled    Comment: Previously worked Herbalist  . Financial resource strain: Not on file  . Food insecurity  Worry: Not on file    Inability: Not on file  . Transportation needs    Medical: Not on file    Non-medical: Not on file  Tobacco Use  . Smoking status: Current Every Day Smoker    Packs/day: 0.50    Years: 23.00    Pack years: 11.50    Types: Cigarettes, E-cigarettes  . Smokeless tobacco: Never Used  Substance and Sexual Activity  . Alcohol use: Yes    Alcohol/week: 6.0 standard  drinks    Types: 6 Cans of beer per week    Comment: occasional  . Drug use: No  . Sexual activity: Yes    Partners: Female  Lifestyle  . Physical activity    Days per week: Not on file    Minutes per session: Not on file  . Stress: Not on file  Relationships  . Social Musician on phone: Not on file    Gets together: Not on file    Attends religious service: Not on file    Active member of club or organization: Not on file    Attends meetings of clubs or organizations: Not on file    Relationship status: Not on file  . Intimate partner violence    Fear of current or ex partner: Not on file    Emotionally abused: Not on file    Physically abused: Not on file    Forced sexual activity: Not on file  Other Topics Concern  . Not on file  Social History Narrative   05/24/12  Ronak was born and grew up in [place], [place].    He has 2 sisters.    He reports that his childhood was "lousy."    He completed the 10th grade.    He has been married for 5 years, and is currently separated for 3 weeks.    He has a daughter who is 39-1/2 years old.    He has been unemployed for 2 years, and is disabled due to an on-the-job work accident.    He is currently living with his aunt and cousin.    He reports that his hobbies are sports and dogs.    He reports that he is spiritual, but not religious.    He states that his aunt and his father are his social support system.    He denies any current legal problems, but got a DUI in 2007. 05/24/12 [facility]       Past Surgical History:  Procedure Laterality Date  . CHOLECYSTECTOMY N/A 01/24/2014   Procedure: LAPAROSCOPIC CHOLECYSTECTOMY;  Surgeon: Axel Filler, MD;  Location: [facility] OR;  Service: General;  Laterality: N/A;  . COLONOSCOPY    . HAND SURGERY    . MASS EXCISION Left 01/25/2013   Procedure: EXCISION MASS DORSAL ASPECT LEFT LONG FINGER DISTAL INTERPHALANGEAL JOINT;  Surgeon: Wyn Forster., MD;  Location: [facility];  Service: Orthopedics;  Laterality: Left;  Left long   . MOUTH SURGERY      Family History  Problem Relation Age of Onset  . Hyperlipidemia Father   . Hypertension Father   . Anxiety disorder Father   . Drug abuse Father   . Cancer Paternal Grandfather        lung, colon  . Colon cancer Paternal Grandfather   . Lung cancer Paternal Grandfather   . Diabetes Paternal Grandmother   . Cancer Paternal Grandmother   . Cancer Maternal Grandmother   .  Cancer Maternal Grandfather   . Lung cancer Maternal Grandfather   . Stroke Other   . Heart disease Other   . Depression Paternal Aunt   . Anxiety disorder Paternal Aunt   . Drug abuse Paternal Uncle   . Colon cancer Paternal Uncle   . Esophageal cancer Neg Hx   . Stomach cancer Neg Hx   . Rectal cancer Neg Hx     Allergies  Allergen Reactions  . Aspirin Other (See Comments) and Tinitus    Tinnitus, dizzy, short of breath  . Codeine Itching    Reaction to tylenol #3  . Penicillins Anaphylaxis and Other (See Comments)    Was told never to take medication.  Has patient had a PCN reaction causing immediate rash, facial/tongue/throat swelling, SOB or lightheadedness with hypotension: no Has patient had a PCN reaction causing severe rash involving mucus membranes or skin necrosis: no Has patient had a PCN reaction that required hospitalization: no Has patient had a PCN reaction occurring within the last 10 years: no If all of the above answers are "NO", then may proceed with Cephalosporin use.   . Tramadol     seizure Other reaction(s): Seizures  . Amitriptyline Rash    Current Outpatient Medications on File Prior to Visit  Medication Sig Dispense Refill  . acetaminophen (TYLENOL) 500 MG tablet Take 1,000-1,500 mg by mouth every 6 (six) hours as needed for moderate pain.    . clonazePAM (KLONOPIN) 1 MG tablet Take 1 tablet by mouth at bedtime.  2  . famotidine (PEPCID) 20 MG tablet Take 20 mg by mouth 2 (two) times  daily.    . fluticasone (FLONASE) 50 MCG/ACT nasal spray Place 2 sprays into both nostrils daily.    Marland Kitchen gabapentin (NEURONTIN) 300 MG capsule Take 1 capsule (300 mg total) by mouth 3 (three) times daily. 270 capsule 3  . ibuprofen (ADVIL,MOTRIN) 800 MG tablet   3  . lidocaine (LINDAMANTLE) 3 % CREA cream Apply 1 application topically daily as needed (chronic pain).   3  . Multiple Vitamin (MULTIVITAMIN WITH MINERALS) TABS tablet Take 1 tablet by mouth daily.    . Nerve Stimulator (TENS THERAPY PAIN RELIEF) DEVI 1 application by Does not apply route daily as needed (chronic pain).    . nortriptyline (PAMELOR) 75 MG capsule Take 1 capsule (75 mg total) by mouth at bedtime. 90 capsule 1  . Oxycodone HCl 10 MG TABS   0  . Pseudoephedrine HCl (SUDAFED PO) Take 1 tablet by mouth daily.    . sildenafil (REVATIO) 20 MG tablet TAKE 1 TABLET BY MOUTH TWICE DAILY 180 tablet 0  . tiZANidine (ZANAFLEX) 4 MG tablet Take 1 tablet by mouth 3 (three) times daily as needed.  6  . famotidine (PEPCID) 20 MG tablet Take 1 tablet (20 mg total) by mouth at bedtime. 30 tablet 11  . omeprazole (PRILOSEC) 40 MG capsule Take 1 capsule (40 mg total) by mouth daily. 30 capsule 11   No current facility-administered medications on file prior to visit.     BP (!) 142/84   Temp (!) 97.2 F (36.2 C)   Ht  (1.778 m)   Wt 217 lb (98.4 kg)   BMI 31.14 kg/m       Objective:   Physical Exam Vitals signs and nursing note reviewed.  Constitutional:      General: He is not in acute distress.    Appearance: Normal appearance. He is not ill-appearing, toxic-appearing or diaphoretic.  HENT:     Head: Normocephalic and atraumatic.     Right Ear: Tympanic membrane, ear canal and external ear normal. There is no impacted cerumen.     Left Ear: Tympanic membrane, ear canal and external ear normal. There is no impacted cerumen.     Nose: Nose normal. No congestion or rhinorrhea.     Mouth/Throat:     Mouth: Mucous  membranes are moist.     Pharynx: Oropharynx is clear. No oropharyngeal exudate or posterior oropharyngeal erythema.  Eyes:     General: No scleral icterus.       Right eye: No discharge.        Left eye: No discharge.     Conjunctiva/sclera: Conjunctivae normal.     Pupils: Pupils are equal, round, and reactive to light.  Neck:     Musculoskeletal: Normal range of motion and neck supple.     Thyroid: No thyromegaly.     Vascular: No JVD.     Trachea: No tracheal deviation.  Cardiovascular:     Rate and Rhythm: Normal rate and regular rhythm.     Pulses: Normal pulses.     Heart sounds: Normal heart sounds. No murmur. No friction rub. No gallop.   Pulmonary:     Effort: Pulmonary effort is normal. No respiratory distress.     Breath sounds: Normal breath sounds. No stridor. No wheezing, rhonchi or rales.  Chest:     Chest wall: No tenderness.  Abdominal:     General: Bowel sounds are normal. There is no distension.     Palpations: Abdomen is soft. There is no mass.     Tenderness: There is no abdominal tenderness. There is no right CVA tenderness, left CVA tenderness, guarding or rebound.     Hernia: No hernia is present.  Musculoskeletal: Normal range of motion.        General: Deformity (left leg) present. No swelling, tenderness or signs of injury.     Right lower leg: No edema.     Left lower leg: No edema.  Lymphadenopathy:     Cervical: No cervical adenopathy.  Skin:    General: Skin is warm and dry.     Coloration: Skin is not jaundiced or pale.     Findings: No bruising, erythema, lesion or rash.  Neurological:     General: No focal deficit present.     Mental Status: He is alert and oriented to person, place, and time. Mental status is at baseline.     Cranial Nerves: No cranial nerve deficit.     Sensory: No sensory deficit.     Motor: No weakness or abnormal muscle tone.     Coordination: Coordination normal.     Gait: Gait normal.     Deep Tendon Reflexes:  Reflexes are normal and symmetric. Reflexes normal.  Psychiatric:        Mood and Affect: Mood normal.        Behavior: Behavior normal.        Thought Content: Thought content normal.        Judgment: Judgment normal.       Assessment & Plan:  1. Routine general medical examination at a health care facility -He needs to quit smoking.  Encourage lifestyle modifications including heart healthy diet and exercise.  Follow-up in 1 year or sooner if needed - CBC with Differential/Platelet - CMP - Lipid panel  2. Essential hypertension -No change in medication. - CBC with Differential/Platelet - CMP -  Lipid panel  3. Gastroesophageal reflux disease, unspecified whether esophagitis present -Tinea with medication and follow-up with GI as directed - CBC with Differential/Platelet - CMP - Lipid panel  4. Tobacco use disorder -He was encouraged to quit smoking.  Does not want to restart Chantix at this time  5. Abdominal pain, chronic, epigastric -Has improved.  Continue with current medications and follow-up with GI as directed  Shirline Freesory Lindzey Zent, NP

## 2019-03-24 ENCOUNTER — Other Ambulatory Visit: Payer: Self-pay | Admitting: Adult Health

## 2019-03-24 MED ORDER — SILDENAFIL CITRATE 20 MG PO TABS: 20.0000 mg | ORAL_TABLET | Freq: Two times a day (BID) | ORAL | 0 refills | Status: DC

## 2019-03-24 NOTE — Telephone Encounter (Signed)
Medication Refill - Medication: sildenafil (REVATIO) 20 MG tablet [[id number]]   omeprazole (PRILOSEC) 40 MG capsule [[id number]] ENDED Patient is needing this medication    Preferred Pharmacy (with phone number or street name): [place], [place] - [address]  [place] [address]  Phone: [phone] Fax: [phone]     Agent: Please be advised that RX refills may take up to 3 business days. We ask that you follow-up with your pharmacy.

## 2019-03-24 NOTE — Telephone Encounter (Signed)
Denied.  Filled earlier today.  Duplicate request.

## 2019-03-24 NOTE — Telephone Encounter (Signed)
Requested medication (s) are due for refill today: yes  Requested medication (s) are on the active medication list:yes  Last refill:  02/16/2018  Future visit scheduled: no  Notes to clinic: Last filled by different provider  Review for refill   Requested Prescriptions  Pending Prescriptions Disp Refills   omeprazole (PRILOSEC) 40 MG capsule 30 capsule 11    Sig: Take 1 capsule (40 mg total) by mouth daily.      Gastroenterology: Proton Pump Inhibitors Passed - 03/24/2019 11:39 AM      Passed - Valid encounter within last 12 months    Recent Outpatient Visits           1 month ago Routine general medical examination at a health care facility   [facility] [place], [place], [place]   7 months ago Left hand pain   Therapist, music at [facility], [place], NP   1 year ago Abscess of right external Neurosurgeon at [facility], [place], NP   1 year ago Cellulitis of earlobe, right   Therapist, music at [facility], [place], MD   1 year ago Esophageal dysphagia   Therapist, music at [place], NP               Signed Prescriptions Disp Refills   sildenafil (REVATIO) 20 MG tablet 180 tablet 0    Sig: Take 1 tablet (20 mg total) by mouth 2 (two) times daily.      Urology: Erectile Dysfunction Agents Failed - 03/24/2019 11:39 AM      Failed - Last BP in normal range    BP Readings from Last 1 Encounters:  02/10/19 (!) 142/84          Passed - Valid encounter within last 12 months    Recent Outpatient Visits           1 month ago Routine general medical examination at a health care facility   [facility] [place], [place], NP   7 months ago Left hand pain   Therapist, music at [facility], [place], NP   1 year ago Abscess of right external Neurosurgeon at [facility], [place], NP   1 year ago Cellulitis of earlobe, right   Therapist, music at [facility], Malka So, MD   1 year ago Esophageal dysphagia   Therapist, music at [facility], [place], NP

## 2019-03-24 NOTE — Telephone Encounter (Signed)
Denied.  Filled by GI.

## 2019-05-18 ENCOUNTER — Telehealth: Payer: Self-pay | Admitting: Adult Health

## 2019-05-18 NOTE — Telephone Encounter (Signed)
Patient is interested in getting a prescription for Chantix.  He has taken this in the past.

## 2019-05-19 ENCOUNTER — Other Ambulatory Visit: Payer: Self-pay | Admitting: Adult Health

## 2019-05-19 MED ORDER — CHANTIX STARTING MONTH PAK 0.5 MG X 11 & 1 MG X 42 PO TABS: ORAL_TABLET | ORAL | 0 refills | Status: DC

## 2019-05-19 MED ORDER — VARENICLINE TARTRATE 1 MG PO TABS: 1.0000 mg | ORAL_TABLET | Freq: Two times a day (BID) | ORAL | 2 refills | Status: AC

## 2019-05-19 NOTE — Telephone Encounter (Signed)
Detravion notified that rx has been sent to the pharmacy.

## 2019-05-19 NOTE — Telephone Encounter (Signed)
Chantix sent to [facility]

## 2019-06-28 ENCOUNTER — Other Ambulatory Visit (HOSPITAL_COMMUNITY): Payer: Self-pay | Admitting: Specialist

## 2019-09-07 ENCOUNTER — Other Ambulatory Visit: Payer: Self-pay

## 2019-09-08 ENCOUNTER — Ambulatory Visit (INDEPENDENT_AMBULATORY_CARE_PROVIDER_SITE_OTHER): Payer: No Typology Code available for payment source | Admitting: Adult Health

## 2019-09-08 ENCOUNTER — Encounter: Payer: Self-pay | Admitting: Adult Health

## 2019-09-08 VITALS — BP 138/90 | HR 102 | Temp 97.1°F | Wt 220.0 lb

## 2019-09-08 DIAGNOSIS — R1011 Right upper quadrant pain: Secondary | ICD-10-CM | POA: Diagnosis not present

## 2019-09-08 NOTE — Progress Notes (Signed)
Subjective:    Patient ID: Shane Cole., male    DOB: 1980/08/24, 39 y.o.   MRN: [id number]  HPI  39 year old male who  has a past medical history of Allergy, Anxiety, Bipolar disorder (HCC), Bronchitis, Crush injury lower leg, Depression, Gastric ulcer, GERD (gastroesophageal reflux disease), Hypertension, Migraine, Nerve pain, and RSD (reflex sympathetic dystrophy).  He presents to the office today for an acute on chronic issues of right upper quadrant pain.  Pain started on 08/27/2019 and lasted for 3 to 4 days.  Pain was felt as a "stabbing pain" and was associated with nausea, vomiting, and diarrhea.  Symptoms have improved significantly since then but continues to have pain with palpation to the right upper quadrant.  He has been seen by GI and has had work-ups done including endoscopy with biopsies as well as a colonoscopy without specific cause.  Reports pain is similar to what he has experienced in the past and usually happens twice a year over the last 8 to 10 years.  He would like to be tested for celiac disease despite noticing any abdominal issues when eating gluten  Review of Systems See HPI   Past Medical History:  Diagnosis Date  . Allergy   . Anxiety   . Bipolar disorder (HCC)   . Bronchitis   . Crush injury lower leg    Left lower leg  . Depression   . Gastric ulcer   . GERD (gastroesophageal reflux disease)    will awaken him in night  . Hypertension   . Migraine   . Nerve pain   . RSD (reflex sympathetic dystrophy)     Social History   Socioeconomic History  . Marital status: Married    Spouse name: Not on file  . Number of children: 1  . Years of education: 10th grade  . Highest education level: Not on file  Occupational History  . Occupation: Disabled    Comment: Previously worked Web designer  Tobacco Use  . Smoking status: Current Every Day Smoker    Packs/day: 0.50    Years: 23.00    Pack years: 11.50    Types:  Cigarettes, E-cigarettes  . Smokeless tobacco: Never Used  Vaping Use  . Vaping Use: Never used  Substance and Sexual Activity  . Alcohol use: Yes    Alcohol/week: 6.0 standard drinks    Types: 6 Cans of beer per week    Comment: occasional  . Drug use: No  . Sexual activity: Yes    Partners: Female  Other Topics Concern  . Not on file  Social History Narrative   05/24/12  Sixto was born and grew up in [place], [place].    He has 2 sisters.    He reports that his childhood was "lousy."    He completed the 10th grade.    He has been married for 5 years, and is currently separated for 3 weeks.    He has a daughter who is 19-1/2 years old.    He has been unemployed for 2 years, and is disabled due to an on-the-job work accident.    He is currently living with his aunt and cousin.    He reports that his hobbies are sports and dogs.    He reports that he is spiritual, but not religious.    He states that his aunt and his father are his social support system.    He denies any current  legal problems, but got a DUI in 2007. 05/24/12 [facility]      Social Determinants of Health   Financial Resource Strain:   . Difficulty of Paying Living Expenses:   Food Insecurity:   . Worried About Charity fundraiser in the Last Year:   . Arboriculturist in the Last Year:   Transportation Needs:   . Film/video editor (Medical):   Marland Kitchen Lack of Transportation (Non-Medical):   Physical Activity:   . Days of Exercise per Week:   . Minutes of Exercise per Session:   Stress:   . Feeling of Stress :   Social Connections:   . Frequency of Communication with Friends and Family:   . Frequency of Social Gatherings with Friends and Family:   . Attends Religious Services:   . Active Member of Clubs or Organizations:   . Attends Archivist Meetings:   Marland Kitchen Marital Status:   Intimate Partner Violence:   . Fear of Current or Ex-Partner:   . Emotionally Abused:   Marland Kitchen Physically Abused:   . Sexually  Abused:     Past Surgical History:  Procedure Laterality Date  . CHOLECYSTECTOMY N/A 01/24/2014   Procedure: LAPAROSCOPIC CHOLECYSTECTOMY;  Surgeon: Ralene Ok, MD;  Location: [facility];  Service: General;  Laterality: N/A;  . COLONOSCOPY    . HAND SURGERY    . MASS EXCISION Left 01/25/2013   Procedure: EXCISION MASS DORSAL ASPECT LEFT LONG FINGER DISTAL INTERPHALANGEAL JOINT;  Surgeon: Cammie Sickle., MD;  Location: [facility];  Service: Orthopedics;  Laterality: Left;  Left long   . MOUTH SURGERY      Family History  Problem Relation Age of Onset  . Hyperlipidemia Father   . Hypertension Father   . Anxiety disorder Father   . Drug abuse Father   . Cancer Paternal Grandfather        lung, colon  . Colon cancer Paternal Grandfather   . Lung cancer Paternal Grandfather   . Diabetes Paternal Grandmother   . Cancer Paternal Grandmother   . Cancer Maternal Grandmother   . Cancer Maternal Grandfather   . Lung cancer Maternal Grandfather   . Stroke Other   . Heart disease Other   . Depression Paternal Aunt   . Anxiety disorder Paternal Aunt   . Drug abuse Paternal Uncle   . Colon cancer Paternal Uncle   . Esophageal cancer Neg Hx   . Stomach cancer Neg Hx   . Rectal cancer Neg Hx     Allergies  Allergen Reactions  . Aspirin Other (See Comments) and Tinitus    Tinnitus, dizzy, short of breath  . Codeine Itching    Reaction to tylenol #3  . Penicillins Anaphylaxis and Other (See Comments)    Was told never to take medication.  Has patient had a PCN reaction causing immediate rash, facial/tongue/throat swelling, SOB or lightheadedness with hypotension: no Has patient had a PCN reaction causing severe rash involving mucus membranes or skin necrosis: no Has patient had a PCN reaction that required hospitalization: no Has patient had a PCN reaction occurring within the last 10 years: no If all of the above answers are "NO", then may proceed with  Cephalosporin use.   . Tramadol     seizure Other reaction(s): Seizures  . Amitriptyline Rash    Current Outpatient Medications on File Prior to Visit  Medication Sig Dispense Refill  . acetaminophen (TYLENOL) 500 MG tablet Take 1,000-1,500  mg by mouth every 6 (six) hours as needed for moderate pain.    . clonazePAM (KLONOPIN) 1 MG tablet Take 1 tablet by mouth at bedtime.  2  . famotidine (PEPCID) 20 MG tablet Take 20 mg by mouth 2 (two) times daily.    . fluticasone (FLONASE) 50 MCG/ACT nasal spray Place 2 sprays into both nostrils daily.    Marland Kitchen gabapentin (NEURONTIN) 300 MG capsule Take 1 capsule (300 mg total) by mouth 3 (three) times daily. 270 capsule 3  . ibuprofen (ADVIL,MOTRIN) 800 MG tablet   3  . lidocaine (LINDAMANTLE) 3 % CREA cream Apply 1 application topically daily as needed (chronic pain).   3  . Multiple Vitamin (MULTIVITAMIN WITH MINERALS) TABS tablet Take 1 tablet by mouth daily.    . Nerve Stimulator (TENS THERAPY PAIN RELIEF) DEVI 1 application by Does not apply route daily as needed (chronic pain).    . nortriptyline (PAMELOR) 75 MG capsule Take 1 capsule (75 mg total) by mouth at bedtime. 90 capsule 1  . ondansetron (ZOFRAN ODT) 4 MG disintegrating tablet Take 1 tablet (4 mg total) by mouth every 8 (eight) hours as needed for nausea or vomiting. 20 tablet 2  . Oxycodone HCl 10 MG TABS   0  . Pseudoephedrine HCl (SUDAFED PO) Take 1 tablet by mouth daily.    . sildenafil (REVATIO) 20 MG tablet Take 1 tablet (20 mg total) by mouth 2 (two) times daily. 180 tablet 0  . tiZANidine (ZANAFLEX) 4 MG tablet Take 1 tablet by mouth 3 (three) times daily as needed.  6  . varenicline (CHANTIX STARTING MONTH PAK) 0.5 MG X 11 & 1 MG X 42 tablet Take one 0.5 mg tablet by mouth once daily for 3 days, then increase to one 0.5 mg tablet twice daily for 4 days, then increase to one 1 mg tablet twice daily. 53 tablet 0  . famotidine (PEPCID) 20 MG tablet Take 1 tablet (20 mg total) by mouth  at bedtime. 30 tablet 11  . omeprazole (PRILOSEC) 40 MG capsule Take 1 capsule (40 mg total) by mouth daily. 30 capsule 11   No current facility-administered medications on file prior to visit.    BP 138/90   Pulse (!) 102   Temp (!) 97.1 F (36.2 C)   Wt 220 lb (99.8 kg)   SpO2 99%   BMI 31.57 kg/m       Objective:   Physical Exam Vitals and nursing note reviewed.  Constitutional:      Appearance: He is well-developed.  Cardiovascular:     Rate and Rhythm: Normal rate and regular rhythm.     Heart sounds: Normal heart sounds.  Pulmonary:     Effort: Pulmonary effort is normal.     Breath sounds: Normal breath sounds.  Abdominal:     General: Bowel sounds are normal.     Palpations: Abdomen is soft.     Tenderness: There is abdominal tenderness in the right upper quadrant.  Skin:    General: Skin is warm and dry.  Neurological:     Mental Status: He is alert.  Psychiatric:        Mood and Affect: Mood normal.        Behavior: Behavior normal.       Assessment & Plan:  1. RUQ pain - Is improving without treatment. Advised watchful waiting and will do lab work  - Amylase; Future - CBC with Differential/Platelet; Future - Celiac Panel 10; Future -  Lipase; Future - Follow up with GI if not resolved in the next week    Shirline Frees, NP

## 2019-10-07 ENCOUNTER — Other Ambulatory Visit: Payer: Self-pay | Admitting: Adult Health

## 2019-10-12 ENCOUNTER — Other Ambulatory Visit: Payer: Self-pay | Admitting: Adult Health

## 2019-11-07 ENCOUNTER — Other Ambulatory Visit (HOSPITAL_COMMUNITY): Payer: Self-pay | Admitting: Specialist

## 2019-11-09 ENCOUNTER — Telehealth: Payer: No Typology Code available for payment source | Admitting: Family

## 2019-11-09 DIAGNOSIS — Z20818 Contact with and (suspected) exposure to other bacterial communicable diseases: Secondary | ICD-10-CM | POA: Diagnosis not present

## 2019-11-09 DIAGNOSIS — J029 Acute pharyngitis, unspecified: Secondary | ICD-10-CM

## 2019-11-09 MED ORDER — AZITHROMYCIN 250 MG PO TABS: ORAL_TABLET | ORAL | 0 refills | Status: DC

## 2019-11-09 NOTE — Progress Notes (Signed)

## 2019-11-22 ENCOUNTER — Other Ambulatory Visit (HOSPITAL_COMMUNITY): Payer: Self-pay | Admitting: Specialist

## 2020-01-03 ENCOUNTER — Other Ambulatory Visit (HOSPITAL_COMMUNITY): Payer: Self-pay | Admitting: Specialist

## 2020-02-02 ENCOUNTER — Other Ambulatory Visit (HOSPITAL_COMMUNITY): Payer: Self-pay | Admitting: Specialist

## 2020-02-29 ENCOUNTER — Other Ambulatory Visit (HOSPITAL_COMMUNITY): Payer: Self-pay | Admitting: Specialist

## 2020-03-20 ENCOUNTER — Encounter: Payer: Self-pay | Admitting: Adult Health

## 2020-03-20 ENCOUNTER — Telehealth (INDEPENDENT_AMBULATORY_CARE_PROVIDER_SITE_OTHER): Payer: No Typology Code available for payment source | Admitting: Adult Health

## 2020-03-20 VITALS — Wt 204.0 lb

## 2020-03-20 DIAGNOSIS — R5383 Other fatigue: Secondary | ICD-10-CM

## 2020-03-20 DIAGNOSIS — R634 Abnormal weight loss: Secondary | ICD-10-CM

## 2020-03-20 DIAGNOSIS — R61 Generalized hyperhidrosis: Secondary | ICD-10-CM | POA: Diagnosis not present

## 2020-03-20 DIAGNOSIS — M2550 Pain in unspecified joint: Secondary | ICD-10-CM

## 2020-03-20 NOTE — Progress Notes (Signed)
Virtual Visit via Video Note  I connected with Shane Cole on 40/98/11 at  5:00 PM EST by a video enabled telemedicine application and verified that I am speaking with the correct person using two identifiers.  Location patient: home Location provider:work or home office Persons participating in the virtual visit: patient, provider  I discussed the limitations of evaluation and management by telemedicine and the availability of in person appointments. The patient expressed understanding and agreed to proceed.   HPI: 39 year old male who is being seen today for for an acute issue.  His symptoms have been present for 2-1/2 months.  Symptoms include unintentional weight loss from 238 pounds to most recent 204 pounds.  He reports that he is unable to gain weight despite not dieting.  Extreme fatigue daily, night sweats, joint pain throughout his body, feeling cold.  It intermittent low-grade fever up to 99.4.  Also reports nausea but no vomiting, some intermittent changes in bowel movements from diarrhea to constipation, and shortness of breath.  Was tested Covid yesterday with a negative result   ROS: See pertinent positives and negatives per HPI.  Past Medical History:  Diagnosis Date  . Allergy   . Anxiety   . Bipolar disorder ([facility])   . Bronchitis   . Crush injury lower leg    Left lower leg  . Depression   . Gastric ulcer   . GERD (gastroesophageal reflux disease)    will awaken him in night  . Hypertension   . Migraine   . Nerve pain   . RSD (reflex sympathetic dystrophy)     Past Surgical History:  Procedure Laterality Date  . CHOLECYSTECTOMY N/A 01/24/2014   Procedure: LAPAROSCOPIC CHOLECYSTECTOMY;  Surgeon: Ralene Ok, MD;  Location: [facility];  Service: General;  Laterality: N/A;  . COLONOSCOPY    . HAND SURGERY    . MASS EXCISION Left 01/25/2013   Procedure: EXCISION MASS DORSAL ASPECT LEFT LONG FINGER DISTAL INTERPHALANGEAL JOINT;  Surgeon: Cammie Sickle.,  MD;  Location: [facility];  Service: Orthopedics;  Laterality: Left;  Left long   . MOUTH SURGERY      Family History  Problem Relation Age of Onset  . Hyperlipidemia Father   . Hypertension Father   . Anxiety disorder Father   . Drug abuse Father   . Cancer Paternal Grandfather        lung, colon  . Colon cancer Paternal Grandfather   . Lung cancer Paternal Grandfather   . Diabetes Paternal Grandmother   . Cancer Paternal Grandmother   . Cancer Maternal Grandmother   . Cancer Maternal Grandfather   . Lung cancer Maternal Grandfather   . Stroke Other   . Heart disease Other   . Depression Paternal Aunt   . Anxiety disorder Paternal Aunt   . Drug abuse Paternal Uncle   . Colon cancer Paternal Uncle   . Esophageal cancer Neg Hx   . Stomach cancer Neg Hx   . Rectal cancer Neg Hx        Current Outpatient Medications:  .  acetaminophen (TYLENOL) 500 MG tablet, Take 1,000-1,500 mg by mouth every 6 (six) hours as needed for moderate pain., Disp: , Rfl:  .  famotidine (PEPCID) 20 MG tablet, Take 20 mg by mouth 2 (two) times daily., Disp: , Rfl:  .  gabapentin (NEURONTIN) 300 MG capsule, Take 1 capsule (300 mg total) by mouth 3 (three) times daily., Disp: 270 capsule, Rfl: 3 .  ibuprofen (  ADVIL,MOTRIN) 800 MG tablet, , Disp: , Rfl: 3 .  lidocaine (LINDAMANTLE) 3 % CREA cream, Apply 1 application topically daily as needed (chronic pain). , Disp: , Rfl: 3 .  Multiple Vitamin (MULTIVITAMIN WITH MINERALS) TABS tablet, Take 1 tablet by mouth daily., Disp: , Rfl:  .  Nerve Stimulator (TENS THERAPY PAIN RELIEF) DEVI, 1 application by Does not apply route daily as needed (chronic pain)., Disp: , Rfl:  .  nortriptyline (PAMELOR) 75 MG capsule, Take 1 capsule (75 mg total) by mouth at bedtime., Disp: 90 capsule, Rfl: 1 .  ondansetron (ZOFRAN ODT) 4 MG disintegrating tablet, Take 1 tablet (4 mg total) by mouth every 8 (eight) hours as needed for nausea or vomiting., Disp: 20  tablet, Rfl: 2 .  oxyCODONE-acetaminophen (PERCOCET) 7.5-325 MG tablet, Take 1 tablet by mouth 3 (three) times daily as needed., Disp: , Rfl:  .  tiZANidine (ZANAFLEX) 4 MG tablet, Take 1 tablet by mouth 3 (three) times daily as needed., Disp: , Rfl: 6  EXAM:  VITALS per patient if applicable:  GENERAL: alert, oriented, appears well and in no acute distress  HEENT: atraumatic, conjunttiva clear, no obvious abnormalities on inspection of external nose and ears  NECK: normal movements of the head and neck  LUNGS: on inspection no signs of respiratory distress, breathing rate appears normal, no obvious gross SOB, gasping or wheezing  CV: no obvious cyanosis  MS: moves all visible extremities without noticeable abnormality  PSYCH/NEURO: pleasant and cooperative, no obvious depression or anxiety, speech and thought processing grossly intact  ASSESSMENT AND PLAN:    Discussed the following assessment and plan:  Unknown cause of his symptoms, will check labs and chest x-ray.  Can consider further imaging such as CT of the chest abdomen and pelvis in the future.  May need to refer to infectious disease  1. Fatigue, unspecified type  - CBC with Differential/Platelet; Future - CMP with eGFR([facility]); Future - TSH; Future - Sedimentation Rate; Future - C-reactive Protein; Future - HIV Antibody (routine testing w rflx); Future - Hep C Antibody; Future - Urinalysis; Future - DG Chest 2 View; Future - Lyme Ab (IgG/M) + RMSF( IgG/M); Future - QuantiFERON-TB Gold Plus; Future - ANA; Future - Culture, Blood; Future - Culture, Blood; Future  2. Arthralgia, unspecified joint  - CBC with Differential/Platelet; Future - CMP with eGFR([facility]); Future - TSH; Future - Sedimentation Rate; Future - C-reactive Protein; Future - HIV Antibody (routine testing w rflx); Future - Hep C Antibody; Future - Urinalysis; Future - DG Chest 2 View; Future - Lyme Ab (IgG/M) + RMSF( IgG/M); Future -  QuantiFERON-TB Gold Plus; Future - ANA; Future - Culture, Blood; Future - Culture, Blood; Future  3. Night sweats  - CBC with Differential/Platelet; Future - CMP with eGFR([facility]); Future - TSH; Future - Sedimentation Rate; Future - C-reactive Protein; Future - HIV Antibody (routine testing w rflx); Future - Hep C Antibody; Future - Urinalysis; Future - DG Chest 2 View; Future - Lyme Ab (IgG/M) + RMSF( IgG/M); Future - QuantiFERON-TB Gold Plus; Future - ANA; Future - Culture, Blood; Future - Culture, Blood; Future  4. Unintentional weight loss  - CBC with Differential/Platelet; Future - CMP with eGFR([facility]); Future - TSH; Future - Sedimentation Rate; Future - C-reactive Protein; Future - HIV Antibody (routine testing w rflx); Future - Hep C Antibody; Future - Urinalysis; Future - DG Chest 2 View; Future - Lyme Ab (IgG/M) + RMSF( IgG/M); Future - QuantiFERON-TB Gold Plus; Future - ANA;  Future - Culture, Blood; Future - Culture, Blood; Future     I discussed the assessment and treatment plan with the patient. The patient was provided an opportunity to ask questions and all were answered. The patient agreed with the plan and demonstrated an understanding of the instructions.   The patient was advised to call back or seek an in-person evaluation if the symptoms worsen or if the condition fails to improve as anticipated.   Dorothyann Peng, NP

## 2020-03-27 NOTE — Addendum Note (Signed)
Addended by: Lerry Liner on: 03/27/2020 07:50 AM   Modules accepted: Orders

## 2020-03-29 ENCOUNTER — Other Ambulatory Visit: Payer: No Typology Code available for payment source

## 2020-03-29 ENCOUNTER — Other Ambulatory Visit: Payer: Self-pay

## 2020-03-29 ENCOUNTER — Other Ambulatory Visit (INDEPENDENT_AMBULATORY_CARE_PROVIDER_SITE_OTHER): Payer: No Typology Code available for payment source

## 2020-03-29 ENCOUNTER — Ambulatory Visit (INDEPENDENT_AMBULATORY_CARE_PROVIDER_SITE_OTHER)
Admission: RE | Admit: 2020-03-29 | Discharge: 2020-03-29 | Disposition: A | Discharge status: Home / Self Care | Payer: No Typology Code available for payment source | Source: Ambulatory Visit | Attending: Adult Health | Admitting: Adult Health

## 2020-03-29 DIAGNOSIS — R5383 Other fatigue: Secondary | ICD-10-CM

## 2020-03-29 DIAGNOSIS — M2550 Pain in unspecified joint: Secondary | ICD-10-CM

## 2020-03-29 DIAGNOSIS — R634 Abnormal weight loss: Secondary | ICD-10-CM

## 2020-03-29 DIAGNOSIS — R61 Generalized hyperhidrosis: Secondary | ICD-10-CM | POA: Diagnosis not present

## 2020-03-29 LAB — COMPREHENSIVE METABOLIC PANEL
ALT: 30 U/L (ref 0–53)
AST: 24 U/L (ref 0–37)
Albumin: 5.2 g/dL (ref 3.5–5.2)
Alkaline Phosphatase: 106 U/L (ref 39–117)
BUN: 16 mg/dL (ref 6–23)
CO2: 27 mEq/L (ref 19–32)
Calcium: 9.5 mg/dL (ref 8.4–10.5)
Chloride: 103 mEq/L (ref 96–112)
Creatinine, Ser: 1.04 mg/dL (ref 0.40–1.50)
GFR: 89.77 mL/min (ref >60.00)
Glucose, Bld: 74 mg/dL (ref 70–99)
Potassium: 3.5 mEq/L (ref 3.5–5.1)
Sodium: 139 mEq/L (ref 135–145)
Total Bilirubin: 0.4 mg/dL (ref 0.2–1.2)
Total Protein: 7.8 g/dL (ref 6.0–8.3)

## 2020-03-29 LAB — CBC WITH DIFFERENTIAL/PLATELET
Basophils Absolute: 0.1 10*3/uL (ref 0.0–0.1)
Basophils Relative: 0.9 % (ref 0.0–3.0)
Eosinophils Absolute: 0.3 10*3/uL (ref 0.0–0.7)
Eosinophils Relative: 3.9 % (ref 0.0–5.0)
HCT: 44.3 % (ref 39.0–52.0)
Hemoglobin: 15.1 g/dL (ref 13.0–17.0)
Lymphocytes Relative: 26.6 % (ref 12.0–46.0)
Lymphs Abs: 2.1 10*3/uL (ref 0.7–4.0)
MCHC: 34.1 g/dL (ref 30.0–36.0)
MCV: 91.1 fl (ref 78.0–100.0)
Monocytes Absolute: 0.6 10*3/uL (ref 0.1–1.0)
Monocytes Relative: 7 % (ref 3.0–12.0)
Neutro Abs: 4.9 10*3/uL (ref 1.4–7.7)
Neutrophils Relative %: 61.6 % (ref 43.0–77.0)
Platelets: 234 10*3/uL (ref 150.0–400.0)
RBC: 4.86 Mil/uL (ref 4.22–5.81)
RDW: 13.1 % (ref 11.5–15.5)
WBC: 7.9 10*3/uL (ref 4.0–10.5)

## 2020-03-29 LAB — SEDIMENTATION RATE: Sed Rate: 18 mm/hr — ABNORMAL HIGH (ref 0–15)

## 2020-03-29 LAB — TSH: TSH: 3.3 u[IU]/mL (ref 0.35–4.50)

## 2020-03-29 LAB — C-REACTIVE PROTEIN: CRP: <1 mg/dL (ref 0.5–20.0)

## 2020-03-29 IMAGING — DX DG CHEST 2V
2 series · 2 of 2 positions shown · non-contrast
Comparison: [DATE], [DATE]

CLINICAL DATA: Unintentional weight loss, weakness

EXAM:
CHEST - 2 VIEW

[chest pa]
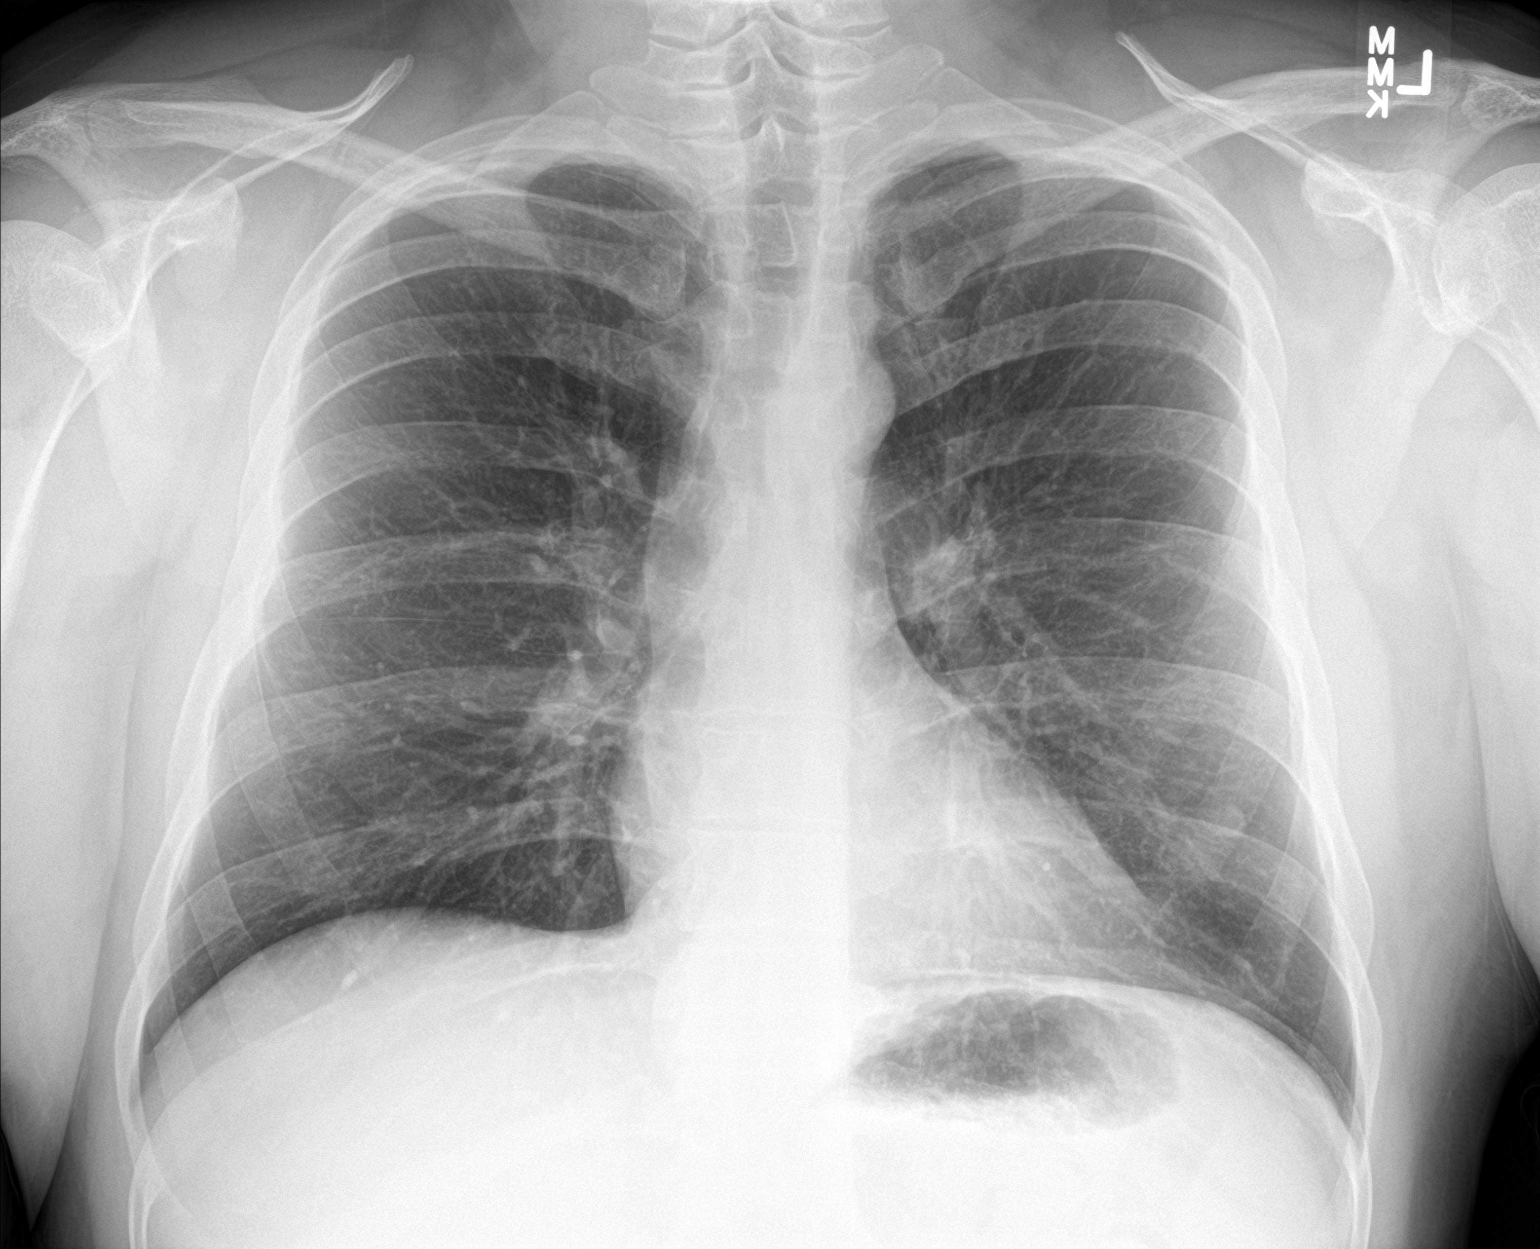

[chest lat]
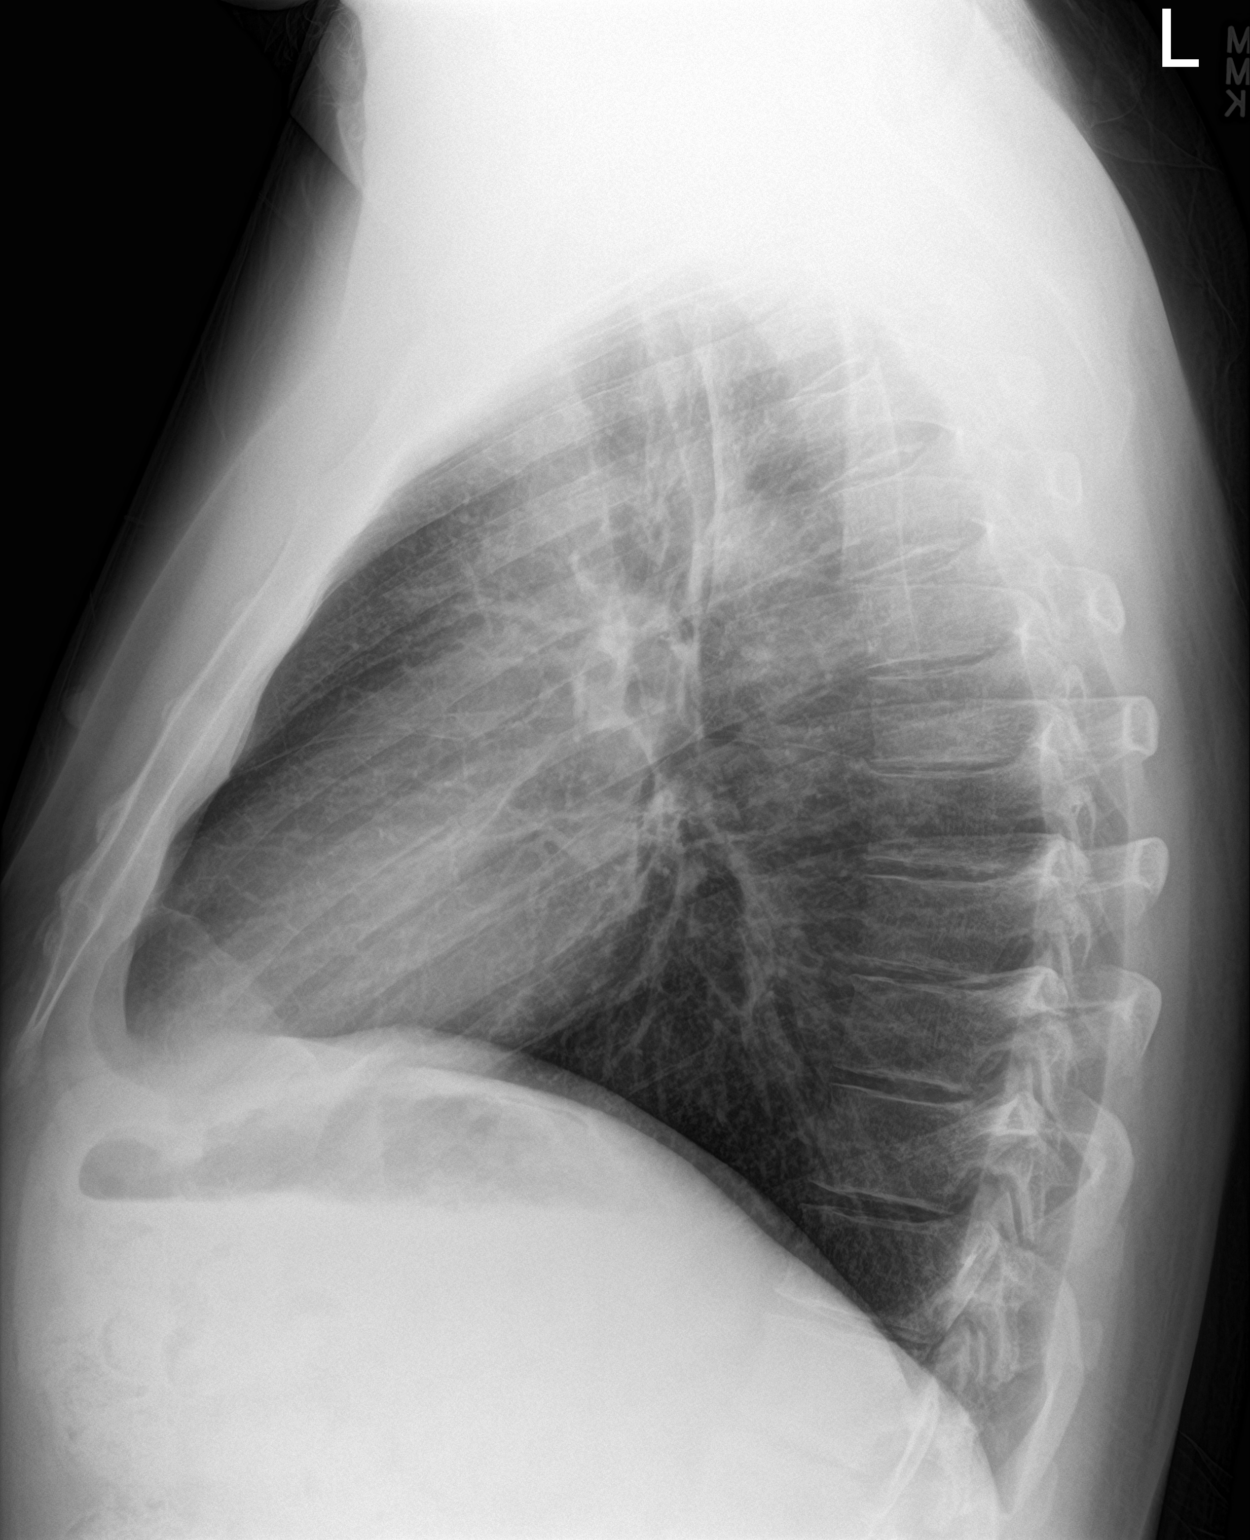

[2 of 2 positions shown; findings below may reference images not displayed]

FINDINGS: The heart size and mediastinal contours are within normal limits and
stable from prior. No focal airspace consolidation, pleural
effusion, or pneumothorax. The visualized skeletal structures are
unremarkable.
IMPRESSION: No active cardiopulmonary disease.

## 2020-03-29 NOTE — Addendum Note (Signed)
Addended by: Lerry Liner on: 03/29/2020 02:25 PM   Modules accepted: Orders

## 2020-03-29 NOTE — Addendum Note (Signed)
Addended by: Lerry Liner on: 03/29/2020 07:25 AM   Modules accepted: Orders

## 2020-03-30 ENCOUNTER — Other Ambulatory Visit (HOSPITAL_COMMUNITY): Payer: Self-pay | Admitting: Specialist

## 2020-04-02 LAB — LYME DISEASE ANTIBODY (IGG), IMMUNOBLOT
B burgdorferi IgG Abs (IB): NEGATIVE
Lyme Disease 18 kD IgG: NONREACTIVE
Lyme Disease 23 kD IgG: NONREACTIVE
Lyme Disease 28 kD IgG: NONREACTIVE
Lyme Disease 30 kD IgG: NONREACTIVE
Lyme Disease 39 kD IgG: NONREACTIVE
Lyme Disease 41 kD IgG: NONREACTIVE
Lyme Disease 45 kD IgG: NONREACTIVE
Lyme Disease 58 kD IgG: NONREACTIVE
Lyme Disease 66 kD IgG: NONREACTIVE
Lyme Disease 93 kD IgG: NONREACTIVE

## 2020-04-04 LAB — CULTURE, BLOOD (SINGLE)
MICRO NUMBER:: 11390036
MICRO NUMBER:: 11390034
Result:: NO GROWTH
Result:: NO GROWTH
SPECIMEN QUALITY:: ADEQUATE
SPECIMEN QUALITY:: ADEQUATE

## 2020-04-04 LAB — QUANTIFERON-TB GOLD PLUS
Mitogen-NIL: >10 IU/mL
NIL: 0.02 IU/mL
QuantiFERON-TB Gold Plus: NEGATIVE
TB1-NIL: 0 IU/mL
TB2-NIL: <0 IU/mL

## 2020-04-04 LAB — HEPATITIS C ANTIBODY
Hepatitis C Ab: NONREACTIVE
SIGNAL TO CUT-OFF: 0.01 (ref <1.00)

## 2020-04-04 LAB — ROCKY MTN SPOTTED FVR ABS PNL(IGG+IGM)
RMSF IgG: NOT DETECTED
RMSF IgM: NOT DETECTED

## 2020-04-04 LAB — ANA: Anti Nuclear Antibody (ANA): NEGATIVE

## 2020-04-04 LAB — HIV ANTIBODY (ROUTINE TESTING W REFLEX): HIV 1&2 Ab, 4th Generation: NONREACTIVE

## 2020-04-06 ENCOUNTER — Encounter: Payer: Self-pay | Admitting: Adult Health

## 2020-04-06 ENCOUNTER — Ambulatory Visit (INDEPENDENT_AMBULATORY_CARE_PROVIDER_SITE_OTHER): Payer: No Typology Code available for payment source | Admitting: Adult Health

## 2020-04-06 ENCOUNTER — Other Ambulatory Visit: Payer: Self-pay

## 2020-04-06 VITALS — BP 116/70 | Temp 99.2°F | Wt 210.0 lb

## 2020-04-06 DIAGNOSIS — M7541 Impingement syndrome of right shoulder: Secondary | ICD-10-CM | POA: Diagnosis not present

## 2020-04-06 DIAGNOSIS — M205X2 Other deformities of toe(s) (acquired), left foot: Secondary | ICD-10-CM | POA: Diagnosis not present

## 2020-04-06 DIAGNOSIS — R634 Abnormal weight loss: Secondary | ICD-10-CM | POA: Diagnosis not present

## 2020-04-06 DIAGNOSIS — M25511 Pain in right shoulder: Secondary | ICD-10-CM | POA: Diagnosis not present

## 2020-04-06 DIAGNOSIS — Z23 Encounter for immunization: Secondary | ICD-10-CM | POA: Diagnosis not present

## 2020-04-06 MED ORDER — METHYLPREDNISOLONE ACETATE 80 MG/ML IJ SUSP
80.0000 mg | Freq: Once | INTRAMUSCULAR | Status: AC
  Administered 2020-04-06: 80 mg via INTRA_ARTICULAR

## 2020-04-06 MED ORDER — METHYLPREDNISOLONE ACETATE 80 MG/ML IJ SUSP: 80.0000 mg | Freq: Once | INTRAMUSCULAR | Status: DC

## 2020-04-06 NOTE — Progress Notes (Signed)
Subjective:    Patient ID: Shane LandryPaul D Goldwire Jr., male    DOB: 09/13/1980, 40 y.o.   MRN: [id number]  HPI 40 year old male who  has a past medical history of Allergy, Anxiety, Bipolar disorder (HCC), Bronchitis, Crush injury lower leg, Depression, Gastric ulcer, GERD (gastroesophageal reflux disease), Hypertension, Migraine, Nerve pain, and RSD (reflex sympathetic dystrophy).   He is being seen today for follow up regarding unintentional weight loss. He was seen via virtual visit on 03/20/2021. At this time his symptoms had been present for 2.5 months. His symptoms included unintentional weight loss from 238 lbs to 204 on time of visit.   His symptoms at this time were " extreme fatigue", night sweats, joint pain throughout the body, chills, and intermittent low grade fevers up to 99.4   Full panel blood work including blood cultures, testing for Lyme disease, [facility], sed rate, CRP, ANA, TSH, hepatitis, and HIV all came back negative.  Today he reports that since our last visit he has recovered fully and is no longer having any symptoms.  States "I feel 1000% better".  Wt Readings from Last 3 Encounters:  04/06/20 210 lb (95.3 kg)  03/20/20 204 lb (92.5 kg)  09/08/19 220 lb (99.8 kg)   Additionally, he has a longstanding history of right shoulder pain, pain has been getting worse over the last few weeks.  Is having a hard time lifting his arm above his head, reaching around to scratch his back, or lift heavy items at work.  He does occasionally have a popping sensation.  Denies any weakness in his hand, numbness or tingling  Third issue he has developed his issues with his left fifth toe.  He does have a history of a crush injury to the left lower leg many years ago.  He has noticed his pinky toe is curling inwards putting pressure on his fourth toe and causing blisters.  Review of Systems See HPI   Past Medical History:  Diagnosis Date  . Allergy   . Anxiety   .  Bipolar disorder (HCC)   . Bronchitis   . Crush injury lower leg    Left lower leg  . Depression   . Gastric ulcer   . GERD (gastroesophageal reflux disease)    will awaken him in night  . Hypertension   . Migraine   . Nerve pain   . RSD (reflex sympathetic dystrophy)     Social History   Socioeconomic History  . Marital status: Married    Spouse name: Not on file  . Number of children: 1  . Years of education: 10th grade  . Highest education level: Not on file  Occupational History  . Occupation: Disabled    Comment: Previously worked Web designerinstalling granite counter tops  Tobacco Use  . Smoking status: Current Every Day Smoker    Packs/day: 0.50    Years: 23.00    Pack years: 11.50    Types: Cigarettes, E-cigarettes  . Smokeless tobacco: Never Used  Vaping Use  . Vaping Use: Never used  Substance and Sexual Activity  . Alcohol use: Yes    Alcohol/week: 6.0 standard drinks    Types: 6 Cans of beer per week    Comment: occasional  . Drug use: No  . Sexual activity: Yes    Partners: Female  Other Topics Concern  . Not on file  Social History Narrative   05/24/12  [name] was born and grew up in [place], [place].    He has 2 sisters.    He reports that his childhood was "lousy."    He completed the 10th grade.    He has been married for 5 years, and is currently separated for 3 weeks.    He has a daughter who is 47-1/2 years old.    He has been unemployed for 2 years, and is disabled due to an on-the-job work accident.    He is currently living with his aunt and cousin.    He reports that his hobbies are sports and dogs.    He reports that he is spiritual, but not religious.    He states that his aunt and his father are his social support system.    He denies any current legal problems, but got a DUI in 2007. 05/24/12 [facility]      Social Determinants of Health   Financial Resource Strain: Not on file  Food Insecurity: Not on file  Transportation Needs: Not on file   Physical Activity: Not on file  Stress: Not on file  Social Connections: Not on file  Intimate Partner Violence: Not on file    Past Surgical History:  Procedure Laterality Date  . CHOLECYSTECTOMY N/A 01/24/2014   Procedure: LAPAROSCOPIC CHOLECYSTECTOMY;  Surgeon: Axel Filler, MD;  Location: [facility];  Service: General;  Laterality: N/A;  . COLONOSCOPY    . HAND SURGERY    . MASS EXCISION Left 01/25/2013   Procedure: EXCISION MASS DORSAL ASPECT LEFT LONG FINGER DISTAL INTERPHALANGEAL JOINT;  Surgeon: Wyn Forster., MD;  Location: [facility];  Service: Orthopedics;  Laterality: Left;  Left long   . MOUTH SURGERY      Family History  Problem Relation Age of Onset  . Hyperlipidemia Father   . Hypertension Father   . Anxiety disorder Father   . Drug abuse Father   . Cancer Paternal Grandfather        lung, colon  . Colon cancer Paternal Grandfather   . Lung cancer Paternal Grandfather   . Diabetes Paternal Grandmother   . Cancer Paternal Grandmother   . Cancer Maternal Grandmother   . Cancer Maternal Grandfather   . Lung cancer Maternal Grandfather   . Stroke Other   . Heart disease Other   . Depression Paternal Aunt   . Anxiety disorder Paternal Aunt   . Drug abuse Paternal Uncle   . Colon cancer Paternal Uncle   . Esophageal cancer Neg Hx   . Stomach cancer Neg Hx   . Rectal cancer Neg Hx     Allergies  Allergen Reactions  . Aspirin Other (See Comments) and Tinitus    Tinnitus, dizzy, short of breath  . Codeine Itching    Reaction to tylenol #3  . Penicillins Anaphylaxis and Other (See Comments)    Was told never to take medication.  Has patient had a PCN reaction causing immediate rash, facial/tongue/throat swelling, SOB or lightheadedness with hypotension: no Has patient had a PCN reaction causing severe rash involving mucus membranes or skin necrosis: no Has patient had a PCN reaction that required hospitalization: no Has patient had a PCN  reaction occurring within the last 10 years: no If all of the above answers are "NO", then may proceed with Cephalosporin use.   . Tramadol     seizure Other reaction(s): Seizures  . Amitriptyline Rash    Current Outpatient Medications on File Prior to Visit  Medication Sig Dispense Refill  .  acetaminophen (TYLENOL) 500 MG tablet Take 1,000-1,500 mg by mouth every 6 (six) hours as needed for moderate pain.    . famotidine (PEPCID) 20 MG tablet Take 20 mg by mouth 2 (two) times daily.    Marland Kitchen gabapentin (NEURONTIN) 300 MG capsule Take 1 capsule (300 mg total) by mouth 3 (three) times daily. 270 capsule 3  . ibuprofen (ADVIL,MOTRIN) 800 MG tablet   3  . lidocaine (LINDAMANTLE) 3 % CREA cream Apply 1 application topically daily as needed (chronic pain).   3  . Multiple Vitamin (MULTIVITAMIN WITH MINERALS) TABS tablet Take 1 tablet by mouth daily.    . Nerve Stimulator (TENS THERAPY PAIN RELIEF) DEVI 1 application by Does not apply route daily as needed (chronic pain).    . nortriptyline (PAMELOR) 75 MG capsule Take 1 capsule (75 mg total) by mouth at bedtime. 90 capsule 1  . ondansetron (ZOFRAN ODT) 4 MG disintegrating tablet Take 1 tablet (4 mg total) by mouth every 8 (eight) hours as needed for nausea or vomiting. 20 tablet 2  . oxyCODONE-acetaminophen (PERCOCET) 7.5-325 MG tablet Take 1 tablet by mouth 3 (three) times daily as needed.    Marland Kitchen tiZANidine (ZANAFLEX) 4 MG tablet Take 1 tablet by mouth 3 (three) times daily as needed.  6   No current facility-administered medications on file prior to visit.    BP 116/70   Temp 99.2 F (37.3 C)   Wt 210 lb (95.3 kg)   BMI 30.13 kg/m       Objective:   Physical Exam Vitals and nursing note reviewed.  Constitutional:      Appearance: Normal appearance.  Cardiovascular:     Rate and Rhythm: Normal rate and regular rhythm.     Pulses: Normal pulses.     Heart sounds: Normal heart sounds.  Pulmonary:     Effort: Pulmonary effort is  normal.     Breath sounds: Normal breath sounds.  Musculoskeletal:     Right shoulder: Tenderness, bony tenderness and crepitus present. Decreased range of motion. Normal strength. Normal pulse.     Left shoulder: Normal.     Comments: Unable  to perform Apley scratch test, Jobes test, or empty can test without comfort and loss of range of motion.  Skin:    General: Skin is warm and dry.     Capillary Refill: Capillary refill takes less than 2 seconds.  Neurological:     General: No focal deficit present.     Mental Status: He is alert and oriented to person, place, and time.  Psychiatric:        Mood and Affect: Mood normal.        Behavior: Behavior normal.        Thought Content: Thought content normal.        Judgment: Judgment normal.       Assessment & Plan:  1. Unintentional weight loss -Symptoms have resolved and his weight is going back up.  No indication to do further imaging such as CT abdomen and chest at this time.  He will keep me informed if his symptoms return and his weight starts to decline again  2. Impingement syndrome of right shoulder -Gust various options such as Depo-Medrol injection, physical therapy.  He opted for steroid injection today in the office.  Shoulder injection Verbal consent obtained and verified. Sterile betadine prep. Furthur cleansed with alcohol. Topical analgesic spray: Ethyl chloride. Joint: right subacromial injection Approached in typical fashion with: posterior approach Completed  without difficulty Meds: 3 cc lidocaine 2% no epi, 1 cc depomedrol 80mg /cc Needle:1.5 inch 25 gauge Aftercare instructions and Red flags advised. Immediate improvement in pain noted  - methylPREDNISolone acetate (DEPO-MEDROL) injection 80 mg -Vies follow-up if no improvement and can consider referral to orthopedics or sports medicine  3. Mallet toe of left foot  - Ambulatory referral to Podiatry  , NP

## 2020-04-06 NOTE — Addendum Note (Signed)
Addended by: Raj Janus T on: 04/06/2020 03:27 PM   Modules accepted: Orders

## 2020-04-11 ENCOUNTER — Other Ambulatory Visit (HOSPITAL_COMMUNITY): Payer: Self-pay | Admitting: Specialist

## 2020-04-30 ENCOUNTER — Telehealth (INDEPENDENT_AMBULATORY_CARE_PROVIDER_SITE_OTHER): Payer: No Typology Code available for payment source | Admitting: Family Medicine

## 2020-04-30 ENCOUNTER — Encounter: Payer: Self-pay | Admitting: Family Medicine

## 2020-04-30 ENCOUNTER — Other Ambulatory Visit: Payer: Self-pay | Admitting: Family Medicine

## 2020-04-30 DIAGNOSIS — J0110 Acute frontal sinusitis, unspecified: Secondary | ICD-10-CM

## 2020-04-30 MED ORDER — DOXYCYCLINE HYCLATE 100 MG PO TABS: 100.0000 mg | ORAL_TABLET | Freq: Two times a day (BID) | ORAL | 0 refills | Status: DC

## 2020-04-30 NOTE — Progress Notes (Signed)
Patient ID: Shane Cole., male   DOB: 05-18-1980, 40 y.o.   MRN: [id number]   This visit type was conducted due to national recommendations for restrictions regarding the COVID-19 pandemic in an effort to limit this patient's exposure and mitigate transmission in our community.   Virtual Visit via Video Note  I connected with Shane Cole on 04/30/20 at  3:45 PM EST by a video enabled telemedicine application and verified that I am speaking with the correct person using two identifiers.  Location patient: home Location provider:work or home office Persons participating in the virtual visit: patient, provider  I discussed the limitations of evaluation and management by telemedicine and the availability of in person appointments. The patient expressed understanding and agreed to proceed.   HPI:  Shane Cole called with 1 week history of increased facial pressure and pain especially over his ethmoid and frontal sinuses- worse on the right side.  Symptoms have been progressive.  He has had some frequent headaches past couple days.  He tried Sudafed and ibuprofen without much relief.  He had Covid test that was negative.  Denies any fever.  No cough.  Some postnasal drip.  Some occasional bloody nasal discharge.  Increased malaise.  He has reported allergy to penicillin.   ROS: See pertinent positives and negatives per HPI.  Past Medical History:  Diagnosis Date  . Allergy   . Anxiety   . Bipolar disorder (HCC)   . Bronchitis   . Crush injury lower leg    Left lower leg  . Depression   . Gastric ulcer   . GERD (gastroesophageal reflux disease)    will awaken him in night  . Hypertension   . Migraine   . Nerve pain   . RSD (reflex sympathetic dystrophy)     Past Surgical History:  Procedure Laterality Date  . CHOLECYSTECTOMY N/A 01/24/2014   Procedure: LAPAROSCOPIC CHOLECYSTECTOMY;  Surgeon: Axel Filler, MD;  Location: [facility];  Service: General;  Laterality: N/A;  .  COLONOSCOPY    . HAND SURGERY    . MASS EXCISION Left 01/25/2013   Procedure: EXCISION MASS DORSAL ASPECT LEFT LONG FINGER DISTAL INTERPHALANGEAL JOINT;  Surgeon: Wyn Forster., MD;  Location: [facility];  Service: Orthopedics;  Laterality: Left;  Left long   . MOUTH SURGERY      Family History  Problem Relation Age of Onset  . Hyperlipidemia Father   . Hypertension Father   . Anxiety disorder Father   . Drug abuse Father   . Cancer Paternal Grandfather        lung, colon  . Colon cancer Paternal Grandfather   . Lung cancer Paternal Grandfather   . Diabetes Paternal Grandmother   . Cancer Paternal Grandmother   . Cancer Maternal Grandmother   . Cancer Maternal Grandfather   . Lung cancer Maternal Grandfather   . Stroke Other   . Heart disease Other   . Depression Paternal Aunt   . Anxiety disorder Paternal Aunt   . Drug abuse Paternal Uncle   . Colon cancer Paternal Uncle   . Esophageal cancer Neg Hx   . Stomach cancer Neg Hx   . Rectal cancer Neg Hx     SOCIAL HX: Current smoker   Current Outpatient Medications:  .  acetaminophen (TYLENOL) 500 MG tablet, Take 1,000-1,500 mg by mouth every 6 (six) hours as needed for moderate pain., Disp: , Rfl:  .  doxycycline (VIBRA-TABS) 100 MG tablet, Take 1  tablet (100 mg total) by mouth 2 (two) times daily., Disp: 20 tablet, Rfl: 0 .  famotidine (PEPCID) 20 MG tablet, Take 20 mg by mouth 2 (two) times daily., Disp: , Rfl:  .  gabapentin (NEURONTIN) 300 MG capsule, Take 1 capsule (300 mg total) by mouth 3 (three) times daily., Disp: 270 capsule, Rfl: 3 .  ibuprofen (ADVIL,MOTRIN) 800 MG tablet, , Disp: , Rfl: 3 .  lidocaine (LINDAMANTLE) 3 % CREA cream, Apply 1 application topically daily as needed (chronic pain). , Disp: , Rfl: 3 .  Multiple Vitamin (MULTIVITAMIN WITH MINERALS) TABS tablet, Take 1 tablet by mouth daily., Disp: , Rfl:  .  Nerve Stimulator (TENS THERAPY PAIN RELIEF) DEVI, 1 application by Does not  apply route daily as needed (chronic pain)., Disp: , Rfl:  .  nortriptyline (PAMELOR) 75 MG capsule, Take 1 capsule (75 mg total) by mouth at bedtime., Disp: 90 capsule, Rfl: 1 .  ondansetron (ZOFRAN ODT) 4 MG disintegrating tablet, Take 1 tablet (4 mg total) by mouth every 8 (eight) hours as needed for nausea or vomiting., Disp: 20 tablet, Rfl: 2 .  oxyCODONE-acetaminophen (PERCOCET) 7.5-325 MG tablet, Take 1 tablet by mouth 3 (three) times daily as needed., Disp: , Rfl:  .  tiZANidine (ZANAFLEX) 4 MG tablet, Take 1 tablet by mouth 3 (three) times daily as needed., Disp: , Rfl: 6  EXAM:  VITALS per patient if applicable:  GENERAL: alert, oriented, appears well and in no acute distress  HEENT: atraumatic, conjunttiva clear, no obvious abnormalities on inspection of external nose and ears  NECK: normal movements of the head and neck  LUNGS: on inspection no signs of respiratory distress, breathing rate appears normal, no obvious gross SOB, gasping or wheezing  CV: no obvious cyanosis  MS: moves all visible extremities without noticeable abnormality  PSYCH/NEURO: pleasant and cooperative, no obvious depression or anxiety, speech and thought processing grossly intact  ASSESSMENT AND PLAN:  Discussed the following assessment and plan:  Acute frontal and ethmoid sinusitis  -Start doxycycline 100 mg twice daily with food -Plenty of fluids -Consider over-the-counter Mucinex twice daily -Follow-up for any persistent or worsening symptoms     I discussed the assessment and treatment plan with the patient. The patient was provided an opportunity to ask questions and all were answered. The patient agreed with the plan and demonstrated an understanding of the instructions.   The patient was advised to call back or seek an in-person evaluation if the symptoms worsen or if the condition fails to improve as anticipated.     Evelena Peat, MD

## 2020-05-03 ENCOUNTER — Other Ambulatory Visit: Payer: Self-pay | Admitting: Adult Health

## 2020-05-03 NOTE — Telephone Encounter (Signed)
PAST DUE FOR CPX. 

## 2020-05-10 ENCOUNTER — Telehealth: Payer: Self-pay | Admitting: Adult Health

## 2020-05-10 NOTE — Telephone Encounter (Signed)
LMOM for a return call.  

## 2020-05-10 NOTE — Telephone Encounter (Signed)
Patient would like a call from [facility] regarding medications.  Please advise.

## 2020-05-11 ENCOUNTER — Other Ambulatory Visit: Payer: Self-pay | Admitting: Adult Health

## 2020-05-11 MED ORDER — SILDENAFIL CITRATE 20 MG PO TABS: 20.0000 mg | ORAL_TABLET | Freq: Two times a day (BID) | ORAL | 0 refills | Status: DC

## 2020-05-11 NOTE — Telephone Encounter (Signed)
Left a message for a return call.

## 2020-05-11 NOTE — Telephone Encounter (Signed)
Pt calling inquiring why his sildenafil rx was denied.  Informed pt because he has not had a recent cpx.  I scheduled the pt for his cpx and sent in a refill of medication.  Nothing further needed.

## 2020-05-28 ENCOUNTER — Ambulatory Visit: Payer: No Typology Code available for payment source | Admitting: Family Medicine

## 2020-05-29 ENCOUNTER — Encounter: Payer: Self-pay | Admitting: Adult Health

## 2020-05-29 ENCOUNTER — Encounter: Payer: Self-pay | Admitting: Family Medicine

## 2020-05-29 ENCOUNTER — Ambulatory Visit (INDEPENDENT_AMBULATORY_CARE_PROVIDER_SITE_OTHER): Payer: No Typology Code available for payment source | Admitting: Adult Health

## 2020-05-29 ENCOUNTER — Other Ambulatory Visit: Payer: Self-pay | Admitting: Adult Health

## 2020-05-29 ENCOUNTER — Ambulatory Visit (INDEPENDENT_AMBULATORY_CARE_PROVIDER_SITE_OTHER): Payer: No Typology Code available for payment source | Admitting: Family Medicine

## 2020-05-29 ENCOUNTER — Other Ambulatory Visit: Payer: Self-pay

## 2020-05-29 VITALS — BP 128/96 | HR 76 | Temp 97.8°F | Wt 203.0 lb

## 2020-05-29 VITALS — BP 160/84 | HR 110 | Temp 98.6°F | Wt 203.5 lb

## 2020-05-29 DIAGNOSIS — G90529 Complex regional pain syndrome I of unspecified lower limb: Secondary | ICD-10-CM

## 2020-05-29 DIAGNOSIS — G894 Chronic pain syndrome: Secondary | ICD-10-CM

## 2020-05-29 DIAGNOSIS — M205X2 Other deformities of toe(s) (acquired), left foot: Secondary | ICD-10-CM | POA: Diagnosis not present

## 2020-05-29 MED ORDER — PREDNISONE 20 MG PO TABS: 20.0000 mg | ORAL_TABLET | Freq: Every day | ORAL | 0 refills | Status: DC

## 2020-05-29 MED ORDER — OXYCODONE HCL 5 MG PO TABS: 5.0000 mg | ORAL_TABLET | ORAL | 0 refills | Status: DC | PRN

## 2020-05-29 NOTE — Progress Notes (Unsigned)
   Subjective:    Patient ID: Shane Cole., male    DOB: 1980/08/08, 40 y.o.   MRN: [id number]  HPI    Review of Systems     Objective:   Physical Exam        Assessment & Plan:

## 2020-05-30 NOTE — Progress Notes (Signed)
Subjective:    Patient ID: Shane Cole., male    DOB: 01/26/1981, 40 y.o.   MRN: [id number]  HPI 40 year old male who  has a past medical history of Allergy, Anxiety, Bipolar disorder (HCC), Bronchitis, Crush injury lower leg, Depression, Gastric ulcer, GERD (gastroesophageal reflux disease), Hypertension, Migraine, Nerve pain, and RSD (reflex sympathetic dystrophy).  He presents to the office to further in need of a new pain management clinic.  He has a history of crush injury to the lower leg many years ago.  Reports that he was informed by his current pain management office that he is now out of network.  He was doing well and had been able to taper down on his narcotic pain medication.  Currently out of medications over-the-counter Motrin/Tylenol is not helping with his pain.  Would like a referral to [facility] as they are in network  Review of Systems See HPI   Past Medical History:  Diagnosis Date  . Allergy   . Anxiety   . Bipolar disorder (HCC)   . Bronchitis   . Crush injury lower leg    Left lower leg  . Depression   . Gastric ulcer   . GERD (gastroesophageal reflux disease)    will awaken him in night  . Hypertension   . Migraine   . Nerve pain   . RSD (reflex sympathetic dystrophy)     Social History   Socioeconomic History  . Marital status: Married    Spouse name: Not on file  . Number of children: 1  . Years of education: 10th grade  . Highest education level: Not on file  Occupational History  . Occupation: Disabled    Comment: Previously worked Web designer  Tobacco Use  . Smoking status: Current Every Day Smoker    Packs/day: 0.50    Years: 23.00    Pack years: 11.50    Types: Cigarettes, E-cigarettes  . Smokeless tobacco: Never Used  Vaping Use  . Vaping Use: Never used  Substance and Sexual Activity  . Alcohol use: Yes    Alcohol/week: 6.0 standard drinks    Types: 6 Cans of beer per week    Comment:  occasional  . Drug use: No  . Sexual activity: Yes    Partners: Female  Other Topics Concern  . Not on file  Social History Narrative   05/24/12  Maximilian was born and grew up in [place], [place].    He has 2 sisters.    He reports that his childhood was "lousy."    He completed the 10th grade.    He has been married for 5 years, and is currently separated for 3 weeks.    He has a daughter who is 55-1/2 years old.    He has been unemployed for 2 years, and is disabled due to an on-the-job work accident.    He is currently living with his aunt and cousin.    He reports that his hobbies are sports and dogs.    He reports that he is spiritual, but not religious.    He states that his aunt and his father are his social support system.    He denies any current legal problems, but got a DUI in 2007. 05/24/12 [facility]      Social Determinants of Health   Financial Resource Strain: Not on file  Food Insecurity: Not on file  Transportation Needs: Not on file  Physical Activity: Not on file  Stress: Not on file  Social Connections: Not on file  Intimate Partner Violence: Not on file    Past Surgical History:  Procedure Laterality Date  . CHOLECYSTECTOMY N/A 01/24/2014   Procedure: LAPAROSCOPIC CHOLECYSTECTOMY;  Surgeon: Axel Filler, MD;  Location: [facility];  Service: General;  Laterality: N/A;  . COLONOSCOPY    . HAND SURGERY    . MASS EXCISION Left 01/25/2013   Procedure: EXCISION MASS DORSAL ASPECT LEFT LONG FINGER DISTAL INTERPHALANGEAL JOINT;  Surgeon: Wyn Forster., MD;  Location:  [facility];  Service: Orthopedics;  Laterality: Left;  Left long   . MOUTH SURGERY      Family History  Problem Relation Age of Onset  . Hyperlipidemia Father   . Hypertension Father   . Anxiety disorder Father   . Drug abuse Father   . Cancer Paternal Grandfather        lung, colon  . Colon cancer Paternal Grandfather   . Lung cancer Paternal Grandfather   . Diabetes Paternal  Grandmother   . Cancer Paternal Grandmother   . Cancer Maternal Grandmother   . Cancer Maternal Grandfather   . Lung cancer Maternal Grandfather   . Stroke Other   . Heart disease Other   . Depression Paternal Aunt   . Anxiety disorder Paternal Aunt   . Drug abuse Paternal Uncle   . Colon cancer Paternal Uncle   . Esophageal cancer Neg Hx   . Stomach cancer Neg Hx   . Rectal cancer Neg Hx     Allergies  Allergen Reactions  . Aspirin Other (See Comments) and Tinitus    Tinnitus, dizzy, short of breath  . Codeine Itching    Reaction to tylenol #3  . Penicillins Anaphylaxis and Other (See Comments)    Was told never to take medication.  Has patient had a PCN reaction causing immediate rash, facial/tongue/throat swelling, SOB or lightheadedness with hypotension: no Has patient had a PCN reaction causing severe rash involving mucus membranes or skin necrosis: no Has patient had a PCN reaction that required hospitalization: no Has patient had a PCN reaction occurring within the last 10 years: no If all of the above answers are "NO", then may proceed with Cephalosporin use.   . Tramadol     seizure Other reaction(s): Seizures  . Amitriptyline Rash    Current Outpatient Medications on File Prior to Visit  Medication Sig Dispense Refill  . acetaminophen (TYLENOL) 500 MG tablet Take 1,000-1,500 mg by mouth every 6 (six) hours as needed for moderate pain.    . famotidine (PEPCID) 20 MG tablet Take 20 mg by mouth 2 (two) times daily.    Marland Kitchen gabapentin (NEURONTIN) 300 MG capsule Take 1 capsule (300 mg total) by mouth 3 (three) times daily. (Patient taking differently: Take 600 mg by mouth 3 (three) times daily.) 270 capsule 3  . ibuprofen (ADVIL,MOTRIN) 800 MG tablet   3  . lidocaine (LINDAMANTLE) 3 % CREA cream Apply 1 application topically daily as needed (chronic pain).   3  . Multiple Vitamin (MULTIVITAMIN WITH MINERALS) TABS tablet Take 1 tablet by mouth daily.    . Nerve Stimulator  (TENS THERAPY PAIN RELIEF) DEVI 1 application by Does not apply route daily as needed (chronic pain).    . nortriptyline (PAMELOR) 75 MG capsule Take 1 capsule (75 mg total) by mouth at bedtime. 90 capsule 1  . ondansetron (ZOFRAN ODT) 4 MG disintegrating tablet Take 1  tablet (4 mg total) by mouth every 8 (eight) hours as needed for nausea or vomiting. 20 tablet 2  . sildenafil (REVATIO) 20 MG tablet Take 1 tablet (20 mg total) by mouth 2 (two) times daily. 180 tablet 0  . tiZANidine (ZANAFLEX) 4 MG tablet Take 1 tablet by mouth 3 (three) times daily as needed.  6   No current facility-administered medications on file prior to visit.    BP (!) 160/84 (BP Location: Left Arm, Patient Position: Sitting, Cuff Size: Normal)   Pulse (!) 110   Temp 98.6 F (37 C) (Oral)   Wt 203 lb 8 oz (92.3 kg)   SpO2 98%   BMI 29.20 kg/m       Objective:   Physical Exam Vitals and nursing note reviewed.  Constitutional:      Appearance: Normal appearance.  Musculoskeletal:        General: Tenderness present. Normal range of motion.  Skin:    General: Skin is warm and dry.  Neurological:     General: No focal deficit present.     Mental Status: He is alert and oriented to person, place, and time.     Gait: Gait abnormal (limping gait ).  Psychiatric:        Mood and Affect: Mood normal.        Behavior: Behavior normal.        Thought Content: Thought content normal.        Judgment: Judgment normal.       Assessment & Plan:  1. Mallet toe of left foot -Give a short course of prednisone and oxycodone to get him through until he is seen by pain management.  Advise follow-up if needed - predniSONE (DELTASONE) 20 MG tablet; Take 1 tablet (20 mg total) by mouth daily with breakfast for 10 days.  Dispense: 10 tablet; Refill: 0 - oxyCODONE (ROXICODONE) 5 MG immediate release tablet; Take 1 tablet (5 mg total) by mouth every 4 (four) hours as needed for up to 5 days for severe pain or breakthrough  pain.  Dispense: 20 tablet; Refill: 0 - Ambulatory referral to Pain Clinic  2. Chronic pain disorder  - predniSONE (DELTASONE) 20 MG tablet; Take 1 tablet (20 mg total) by mouth daily with breakfast for 10 days.  Dispense: 10 tablet; Refill: 0 - oxyCODONE (ROXICODONE) 5 MG immediate release tablet; Take 1 tablet (5 mg total) by mouth every 4 (four) hours as needed for up to 5 days for severe pain or breakthrough pain.  Dispense: 20 tablet; Refill: 0 - Ambulatory referral to Pain Clinic

## 2020-06-08 ENCOUNTER — Telehealth: Payer: Self-pay | Admitting: Adult Health

## 2020-06-08 ENCOUNTER — Other Ambulatory Visit: Payer: Self-pay | Admitting: Adult Health

## 2020-06-08 MED ORDER — OXYCODONE HCL 5 MG PO TABS: 5.0000 mg | ORAL_TABLET | Freq: Four times a day (QID) | ORAL | 0 refills | Status: DC | PRN

## 2020-06-08 NOTE — Telephone Encounter (Signed)
Patient is calling and requesting a refill for Oxycodone sent to [facility]  [address] [place], [place] [address]  Phone:  [phone] Fax:  [phone]   Per patient provider gave him this medication for a couple of days before, please advise. CB is [phone]

## 2020-06-08 NOTE — Telephone Encounter (Signed)
Last OV: 05/29/2020 #20 tablet, Refill 0 Medication not on current list Future OV: 07/03/2020

## 2020-06-25 ENCOUNTER — Other Ambulatory Visit: Payer: Self-pay | Admitting: Adult Health

## 2020-06-25 DIAGNOSIS — M205X2 Other deformities of toe(s) (acquired), left foot: Secondary | ICD-10-CM

## 2020-06-25 DIAGNOSIS — G894 Chronic pain syndrome: Secondary | ICD-10-CM

## 2020-06-27 NOTE — Telephone Encounter (Signed)
Last office visit- 05/29/2020

## 2020-06-28 ENCOUNTER — Other Ambulatory Visit (HOSPITAL_COMMUNITY): Payer: Self-pay

## 2020-06-28 MED ORDER — PREDNISONE 20 MG PO TABS
ORAL_TABLET | ORAL | 0 refills | Status: DC
  Filled 2020-06-28: qty 10, 10d supply, fill #0

## 2020-06-28 MED ORDER — OXYCODONE HCL 5 MG PO TABS
ORAL_TABLET | Freq: Four times a day (QID) | ORAL | 0 refills | Status: DC | PRN
  Filled 2020-06-28: qty 20, 5d supply, fill #0

## 2020-07-02 ENCOUNTER — Other Ambulatory Visit: Payer: Self-pay

## 2020-07-03 ENCOUNTER — Encounter: Payer: Self-pay | Admitting: Adult Health

## 2020-07-03 ENCOUNTER — Ambulatory Visit (INDEPENDENT_AMBULATORY_CARE_PROVIDER_SITE_OTHER): Payer: No Typology Code available for payment source | Admitting: Adult Health

## 2020-07-03 VITALS — BP 142/78 | HR 87 | Temp 98.5°F | Ht 69.5 in | Wt 204.8 lb

## 2020-07-03 DIAGNOSIS — M205X2 Other deformities of toe(s) (acquired), left foot: Secondary | ICD-10-CM | POA: Diagnosis not present

## 2020-07-03 DIAGNOSIS — I10 Essential (primary) hypertension: Secondary | ICD-10-CM

## 2020-07-03 DIAGNOSIS — Z0000 Encounter for general adult medical examination without abnormal findings: Secondary | ICD-10-CM

## 2020-07-03 DIAGNOSIS — F17200 Nicotine dependence, unspecified, uncomplicated: Secondary | ICD-10-CM | POA: Diagnosis not present

## 2020-07-03 DIAGNOSIS — K219 Gastro-esophageal reflux disease without esophagitis: Secondary | ICD-10-CM

## 2020-07-03 DIAGNOSIS — G894 Chronic pain syndrome: Secondary | ICD-10-CM | POA: Diagnosis not present

## 2020-07-03 LAB — CBC WITH DIFFERENTIAL/PLATELET
Basophils Absolute: 0 10*3/uL (ref 0.0–0.1)
Basophils Relative: 0.4 % (ref 0.0–3.0)
Eosinophils Absolute: 0 10*3/uL (ref 0.0–0.7)
Eosinophils Relative: 0.5 % (ref 0.0–5.0)
HCT: 45.2 % (ref 39.0–52.0)
Hemoglobin: 15.3 g/dL (ref 13.0–17.0)
Lymphocytes Relative: 24.6 % (ref 12.0–46.0)
Lymphs Abs: 2.1 10*3/uL (ref 0.7–4.0)
MCHC: 33.8 g/dL (ref 30.0–36.0)
MCV: 92.7 fl (ref 78.0–100.0)
Monocytes Absolute: 0.6 10*3/uL (ref 0.1–1.0)
Monocytes Relative: 6.6 % (ref 3.0–12.0)
Neutro Abs: 5.9 10*3/uL (ref 1.4–7.7)
Neutrophils Relative %: 67.9 % (ref 43.0–77.0)
Platelets: 256 10*3/uL (ref 150.0–400.0)
RBC: 4.88 Mil/uL (ref 4.22–5.81)
RDW: 13 % (ref 11.5–15.5)
WBC: 8.7 10*3/uL (ref 4.0–10.5)

## 2020-07-03 LAB — COMPREHENSIVE METABOLIC PANEL
ALT: 129 U/L — ABNORMAL HIGH (ref 0–53)
AST: 158 U/L — ABNORMAL HIGH (ref 0–37)
Albumin: 4.7 g/dL (ref 3.5–5.2)
Alkaline Phosphatase: 153 U/L — ABNORMAL HIGH (ref 39–117)
BUN: 16 mg/dL (ref 6–23)
CO2: 30 mEq/L (ref 19–32)
Calcium: 9.7 mg/dL (ref 8.4–10.5)
Chloride: 99 mEq/L (ref 96–112)
Creatinine, Ser: 0.88 mg/dL (ref 0.40–1.50)
GFR: 107.3 mL/min (ref >60.00)
Glucose, Bld: 77 mg/dL (ref 70–99)
Potassium: 4 mEq/L (ref 3.5–5.1)
Sodium: 140 mEq/L (ref 135–145)
Total Bilirubin: 0.6 mg/dL (ref 0.2–1.2)
Total Protein: 7.4 g/dL (ref 6.0–8.3)

## 2020-07-03 LAB — LIPID PANEL
Cholesterol: 220 mg/dL — ABNORMAL HIGH (ref 0–200)
HDL: 83.8 mg/dL (ref >39.00)
LDL Cholesterol: 119 mg/dL — ABNORMAL HIGH (ref 0–99)
NonHDL: 135.83
Total CHOL/HDL Ratio: 3
Triglycerides: 82 mg/dL (ref 0.0–149.0)
VLDL: 16.4 mg/dL (ref 0.0–40.0)

## 2020-07-03 NOTE — Progress Notes (Signed)
Subjective:    Patient ID: Shane Cole., male    DOB: [birth year], 40 y.o.   MRN: [id number]  HPI Patient presents for yearly preventative medicine examination. He is a pleasant 40 year old male who  has a past medical history of Allergy, Anxiety, Bipolar disorder (HCC), Bronchitis, Crush injury lower leg, Depression, Gastric ulcer, GERD (gastroesophageal reflux disease), Hypertension, Migraine, Nerve pain, and RSD (reflex sympathetic dystrophy).  GERD-managed by gastroenterology.  Currently well prescribed with Pepcid 20 mg twice daily.  Hypertension -managed with sildenafil 20 mg twice daily.  This was previously prescribed by neurology for management of his hypertension.  He has been well controlled on it so we have decided to keep him on it. He does check his blood pressure at home and reports readings in the 120-130/80's.  BP Readings from Last 3 Encounters:  07/03/20 (!) 142/78  05/29/20 (!) 160/84  05/29/20 (!) 128/96   Tobacco Use - He is continues smoke, has cut back to three a day.   Chronic Pain Syndrome - s./p crush injury to left foot.  Was being seen by pain management but most recently switched his insurance and his pain management clinic was no longer in his network.  He has been referred to a new pain management and has that appointment at the beginning of next month.  Currently we are managing his pain with oxycodone,prednisone therapy, gabapentin 600 mg TID,  Nortriptyline 75 mg QHS In the past he has been prescribed Zanaflex 4 mg 3 times daily as needed   All immunizations and health maintenance protocols were reviewed with the patient and needed orders were placed.  Appropriate screening laboratory values were ordered for the patient including screening of hyperlipidemia, renal function and hepatic function. If indicated by BPH, a PSA was ordered.  Medication reconciliation,  past medical history, social history, problem list and allergies were reviewed in detail  with the patient  Goals were established with regard to weight loss, exercise, and  diet in compliance with medications   Wt Readings from Last 3 Encounters:  07/03/20 204 lb 12.8 oz (92.9 kg)  05/29/20 203 lb 8 oz (92.3 kg)  05/29/20 203 lb (92.1 kg)    Review of Systems  Constitutional: Negative.   HENT: Negative.   Eyes: Negative.   Respiratory: Negative.   Cardiovascular: Negative.   Gastrointestinal: Negative.   Endocrine: Negative.   Genitourinary: Negative.   Musculoskeletal: Positive for arthralgias and gait problem.  Skin: Negative.   Allergic/Immunologic: Negative.   Hematological: Negative.   Psychiatric/Behavioral: Negative.   All other systems reviewed and are negative.  Past Medical History:  Diagnosis Date  . Allergy   . Anxiety   . Bipolar disorder (HCC)   . Bronchitis   . Crush injury lower leg    Left lower leg  . Depression   . Gastric ulcer   . GERD (gastroesophageal reflux disease)    will awaken him in night  . Hypertension   . Migraine   . Nerve pain   . RSD (reflex sympathetic dystrophy)     Social History   Socioeconomic History  . Marital status: Married    Spouse name: Not on file  . Number of children: 1  . Years of education: 10th grade  . Highest education level: Not on file  Occupational History  . Occupation: Disabled    Comment: Previously worked Web designer  Tobacco Use  . Smoking status: Current Every Day Smoker  Packs/day: 0.50    Years: 23.00    Pack years: 11.50    Types: Cigarettes, E-cigarettes  . Smokeless tobacco: Never Used  Vaping Use  . Vaping Use: Never used  Substance and Sexual Activity  . Alcohol use: Yes    Alcohol/week: 6.0 standard drinks    Types: 6 Cans of beer per week    Comment: occasional  . Drug use: No  . Sexual activity: Yes    Partners: Female  Other Topics Concern  . Not on file  Social History Narrative   05/24/12  Kelly was born and grew up in [place], [place].    He has 2 sisters.    He reports that his childhood was "lousy."    He completed the 10th grade.    He has been married for 5 years, and is currently separated for 3 weeks.    He has a daughter who is 46-1/2 years old.    He has been unemployed for 2 years, and is disabled due to an on-the-job work accident.    He is currently living with his aunt and cousin.    He reports that his hobbies are sports and dogs.    He reports that he is spiritual, but not religious.    He states that his aunt and his father are his social support system.    He denies any current legal problems, but got a DUI in 2007. 05/24/12 [facility]      Social Determinants of Health   Financial Resource Strain: Not on file  Food Insecurity: Not on file  Transportation Needs: Not on file  Physical Activity: Not on file  Stress: Not on file  Social Connections: Not on file  Intimate Partner Violence: Not on file    Past Surgical History:  Procedure Laterality Date  . CHOLECYSTECTOMY N/A 01/24/2014   Procedure: LAPAROSCOPIC CHOLECYSTECTOMY;  Surgeon: Axel Filler, MD;  Location: [facility];  Service: General;  Laterality: N/A;  . COLONOSCOPY    . HAND SURGERY    . MASS EXCISION Left 01/25/2013   Procedure: EXCISION MASS DORSAL ASPECT LEFT LONG FINGER DISTAL INTERPHALANGEAL JOINT;  Surgeon: Wyn Forster., MD;  Location: [facility];  Service: Orthopedics;  Laterality: Left;  Left long   . MOUTH SURGERY      Family History  Problem Relation Age of Onset  . Hyperlipidemia Father   . Hypertension Father   . Anxiety disorder Father   . Drug abuse Father   . Cancer Paternal Grandfather        lung, colon  . Colon cancer Paternal Grandfather   . Lung cancer Paternal Grandfather   . Diabetes Paternal Grandmother   . Cancer Paternal Grandmother   . Cancer Maternal Grandmother   . Cancer Maternal Grandfather   . Lung cancer Maternal Grandfather   . Stroke Other   . Heart disease Other   .  Depression Paternal Aunt   . Anxiety disorder Paternal Aunt   . Drug abuse Paternal Uncle   . Colon cancer Paternal Uncle   . Esophageal cancer Neg Hx   . Stomach cancer Neg Hx   . Rectal cancer Neg Hx     Allergies  Allergen Reactions  . Aspirin Other (See Comments) and Tinitus    Tinnitus, dizzy, short of breath  . Codeine Itching    Reaction to tylenol #3  . Penicillins Anaphylaxis and Other (See Comments)    Was told never to  take medication.  Has patient had a PCN reaction causing immediate rash, facial/tongue/throat swelling, SOB or lightheadedness with hypotension: no Has patient had a PCN reaction causing severe rash involving mucus membranes or skin necrosis: no Has patient had a PCN reaction that required hospitalization: no Has patient had a PCN reaction occurring within the last 10 years: no If all of the above answers are "NO", then may proceed with Cephalosporin use.   . Tramadol     seizure Other reaction(s): Seizures  . Amitriptyline Rash    Current Outpatient Medications on File Prior to Visit  Medication Sig Dispense Refill  . acetaminophen (TYLENOL) 500 MG tablet Take 1,000-1,500 mg by mouth every 6 (six) hours as needed for moderate pain.    . famotidine (PEPCID) 20 MG tablet Take 20 mg by mouth 2 (two) times daily.    Marland Kitchen gabapentin (NEURONTIN) 300 MG capsule Take 1 capsule (300 mg total) by mouth 3 (three) times daily. (Patient taking differently: Take 600 mg by mouth 3 (three) times daily.) 270 capsule 3  . ibuprofen (ADVIL,MOTRIN) 800 MG tablet   3  . lidocaine (LINDAMANTLE) 3 % CREA cream Apply 1 application topically daily as needed (chronic pain).   3  . Multiple Vitamin (MULTIVITAMIN WITH MINERALS) TABS tablet Take 1 tablet by mouth daily.    . Nerve Stimulator (TENS THERAPY PAIN RELIEF) DEVI 1 application by Does not apply route daily as needed (chronic pain).    . nortriptyline (PAMELOR) 75 MG capsule Take 1 capsule (75 mg total) by mouth at bedtime.  90 capsule 1  . ondansetron (ZOFRAN ODT) 4 MG disintegrating tablet Take 1 tablet (4 mg total) by mouth every 8 (eight) hours as needed for nausea or vomiting. 20 tablet 2  . oxyCODONE (OXY IR/ROXICODONE) 5 MG immediate release tablet TAKE 1 TABLET BY MOUTH EVERY 6 HOURS AS NEEDED FOR SEVERE PAIN 20 tablet 0  . predniSONE (DELTASONE) 20 MG tablet TAKE 1 TABLET BY MOUTH ONCE A DAY WITH BREAKFAST FOR 10 DAYS 10 tablet 0  . sildenafil (REVATIO) 20 MG tablet TAKE 1 TABLET BY MOUTH 2 TIMES DAILY 180 tablet 0  . tiZANidine (ZANAFLEX) 4 MG tablet Take 1 tablet by mouth 3 (three) times daily as needed.  6   No current facility-administered medications on file prior to visit.    BP (!) 142/78 (BP Location: Right Arm, Patient Position: Sitting, Cuff Size: Normal)   Pulse 87   Temp 98.5 F (36.9 C) (Oral)   Ht 5' 9.5" (1.765 m)   Wt 204 lb 12.8 oz (92.9 kg)   SpO2 98%   BMI 29.81 kg/m       Objective:   Physical Exam Vitals and nursing note reviewed.  Constitutional:      General: He is not in acute distress.    Appearance: Normal appearance. He is well-developed and normal weight.  HENT:     Head: Normocephalic and atraumatic.     Right Ear: Tympanic membrane, ear canal and external ear normal. There is no impacted cerumen.     Left Ear: Tympanic membrane, ear canal and external ear normal. There is no impacted cerumen.     Nose: Nose normal. No congestion or rhinorrhea.     Mouth/Throat:     Mouth: Mucous membranes are moist.     Pharynx: Oropharynx is clear. No oropharyngeal exudate or posterior oropharyngeal erythema.  Eyes:     General:        Right eye: No discharge.  Left eye: No discharge.     Extraocular Movements: Extraocular movements intact.     Conjunctiva/sclera: Conjunctivae normal.     Pupils: Pupils are equal, round, and reactive to light.  Neck:     Vascular: No carotid bruit.     Trachea: No tracheal deviation.  Cardiovascular:     Rate and Rhythm: Normal  rate and regular rhythm.     Pulses: Normal pulses.     Heart sounds: Normal heart sounds. No murmur heard. No friction rub. No gallop.   Pulmonary:     Effort: Pulmonary effort is normal. No respiratory distress.     Breath sounds: Normal breath sounds. No stridor. No wheezing, rhonchi or rales.  Chest:     Chest wall: No tenderness.  Abdominal:     General: Bowel sounds are normal. There is no distension.     Palpations: Abdomen is soft. There is no mass.     Tenderness: There is no abdominal tenderness. There is no right CVA tenderness, left CVA tenderness, guarding or rebound.     Hernia: No hernia is present.  Musculoskeletal:        General: Tenderness (left foot) and deformity present. No swelling or signs of injury. Normal range of motion.     Right lower leg: No edema.     Left lower leg: No edema.  Lymphadenopathy:     Cervical: No cervical adenopathy.  Skin:    General: Skin is warm and dry.     Capillary Refill: Capillary refill takes less than 2 seconds.     Coloration: Skin is not jaundiced or pale.     Findings: No bruising, erythema, lesion or rash.  Neurological:     General: No focal deficit present.     Mental Status: He is alert and oriented to person, place, and time.     Cranial Nerves: No cranial nerve deficit.     Sensory: No sensory deficit.     Motor: No weakness.     Coordination: Coordination normal.     Gait: Gait abnormal (limping gait ).     Deep Tendon Reflexes: Reflexes normal.  Psychiatric:        Mood and Affect: Mood normal.        Behavior: Behavior normal.        Thought Content: Thought content normal.        Judgment: Judgment normal.       Assessment & Plan:  1. Routine general medical examination at a health care facility - Needs to quit smoking - Work on lifestyle modifications  - CBC with Differential/Platelet; Future - Comprehensive metabolic panel; Future - Lipid panel; Future - CBC with Differential/Platelet -  Comprehensive metabolic panel - Lipid panel  2. Mallet toe of left foot - Follow up with pain management as directed  3. Chronic pain disorder - Follow up with pain management next month  - Controlled substance database reviewed. No red flags noted   4. Essential hypertension - Slightly elevated today - Continue to monitor  - CBC with Differential/Platelet; Future - Comprehensive metabolic panel; Future - Lipid panel; Future - CBC with Differential/Platelet - Comprehensive metabolic panel - Lipid panel  5. Gastroesophageal reflux disease, unspecified whether esophagitis present - Continue with Pepcid. Follow up with GI as directed - CBC with Differential/Platelet; Future - Comprehensive metabolic panel; Future - Lipid panel; Future - CBC with Differential/Platelet - Comprehensive metabolic panel - Lipid panel  6. Tobacco use disorder - Encouraged to quit  smoking completely  - CBC with Differential/Platelet; Future - Comprehensive metabolic panel; Future - Lipid panel; Future - CBC with Differential/Platelet - Comprehensive metabolic panel - Lipid panel  Shirline Frees, NP

## 2020-07-04 ENCOUNTER — Telehealth: Payer: Self-pay | Admitting: Adult Health

## 2020-07-04 DIAGNOSIS — R748 Abnormal levels of other serum enzymes: Secondary | ICD-10-CM

## 2020-07-04 NOTE — Telephone Encounter (Signed)
Dated patient on his labs, his alk phos and AST/ALT are severely elevated.  He does report taking Tylenol 1000 mg every 4-6 hours for chronic pain.  Advised to cut back on Tylenol 2000 mg once or twice a day.  He is not drinking alcohol, refrain from alcohol.  We will recheck liver enzymes in 2 weeks

## 2020-07-05 ENCOUNTER — Other Ambulatory Visit (HOSPITAL_COMMUNITY): Payer: Self-pay

## 2020-07-11 ENCOUNTER — Other Ambulatory Visit (HOSPITAL_COMMUNITY): Payer: Self-pay

## 2020-07-11 ENCOUNTER — Other Ambulatory Visit: Payer: Self-pay | Admitting: Adult Health

## 2020-07-12 ENCOUNTER — Telehealth: Payer: Self-pay | Admitting: Adult Health

## 2020-07-12 NOTE — Telephone Encounter (Signed)
oxyCODONE (OXY IR/ROXICODONE) 5 MG immediate release tablet  [facility] Phone:  [phone]  Fax:  [phone]

## 2020-07-13 ENCOUNTER — Other Ambulatory Visit (HOSPITAL_COMMUNITY): Payer: Self-pay

## 2020-07-13 MED ORDER — TIZANIDINE HCL 4 MG PO TABS
ORAL_TABLET | Freq: Three times a day (TID) | ORAL | 0 refills | Status: DC
  Filled 2020-07-13: qty 90, 30d supply, fill #0

## 2020-07-13 MED ORDER — OXYCODONE HCL 5 MG PO TABS
ORAL_TABLET | Freq: Four times a day (QID) | ORAL | 0 refills | Status: DC | PRN
  Filled 2020-07-13: qty 25, 6d supply, fill #0

## 2020-07-13 NOTE — Telephone Encounter (Signed)
Noted, 30 day rx sent to patients pharmacy.

## 2020-07-13 NOTE — Addendum Note (Signed)
Addended by: Nancy Fetter on: 07/13/2020 07:03 AM   Modules accepted: Orders

## 2020-07-24 ENCOUNTER — Other Ambulatory Visit: Payer: Self-pay

## 2020-07-24 ENCOUNTER — Emergency Department (HOSPITAL_BASED_OUTPATIENT_CLINIC_OR_DEPARTMENT_OTHER)
Admission: EM | Admit: 2020-07-24 | Discharge: 2020-07-24 | Disposition: A | Discharge status: Home / Self Care | Payer: No Typology Code available for payment source | Attending: Emergency Medicine | Admitting: Emergency Medicine

## 2020-07-24 ENCOUNTER — Encounter: Payer: Self-pay | Admitting: Adult Health

## 2020-07-24 ENCOUNTER — Encounter (HOSPITAL_BASED_OUTPATIENT_CLINIC_OR_DEPARTMENT_OTHER): Payer: Self-pay | Admitting: Emergency Medicine

## 2020-07-24 ENCOUNTER — Emergency Department (HOSPITAL_BASED_OUTPATIENT_CLINIC_OR_DEPARTMENT_OTHER): Payer: No Typology Code available for payment source

## 2020-07-24 ENCOUNTER — Ambulatory Visit (INDEPENDENT_AMBULATORY_CARE_PROVIDER_SITE_OTHER): Payer: No Typology Code available for payment source | Admitting: Adult Health

## 2020-07-24 DIAGNOSIS — R1110 Vomiting, unspecified: Secondary | ICD-10-CM | POA: Insufficient documentation

## 2020-07-24 DIAGNOSIS — R000 Tachycardia, unspecified: Secondary | ICD-10-CM | POA: Diagnosis not present

## 2020-07-24 DIAGNOSIS — R109 Unspecified abdominal pain: Secondary | ICD-10-CM

## 2020-07-24 DIAGNOSIS — R1031 Right lower quadrant pain: Secondary | ICD-10-CM | POA: Diagnosis not present

## 2020-07-24 DIAGNOSIS — I10 Essential (primary) hypertension: Secondary | ICD-10-CM | POA: Diagnosis not present

## 2020-07-24 DIAGNOSIS — F17210 Nicotine dependence, cigarettes, uncomplicated: Secondary | ICD-10-CM | POA: Diagnosis not present

## 2020-07-24 LAB — CBC WITH DIFFERENTIAL/PLATELET
Abs Immature Granulocytes: 0.02 10*3/uL (ref 0.00–0.07)
Basophils Absolute: 0 10*3/uL (ref 0.0–0.1)
Basophils Relative: 0 %
Eosinophils Absolute: 0.1 10*3/uL (ref 0.0–0.5)
Eosinophils Relative: 1 %
HCT: 50.4 % (ref 39.0–52.0)
Hemoglobin: 16.9 g/dL (ref 13.0–17.0)
Immature Granulocytes: 0 %
Lymphocytes Relative: 23 %
Lymphs Abs: 2.1 10*3/uL (ref 0.7–4.0)
MCH: 30.8 pg (ref 26.0–34.0)
MCHC: 33.5 g/dL (ref 30.0–36.0)
MCV: 91.8 fL (ref 80.0–100.0)
Monocytes Absolute: 0.6 10*3/uL (ref 0.1–1.0)
Monocytes Relative: 6 %
Neutro Abs: 6.3 10*3/uL (ref 1.7–7.7)
Neutrophils Relative %: 70 %
Platelets: 250 10*3/uL (ref 150–400)
RBC: 5.49 MIL/uL (ref 4.22–5.81)
RDW: 12.3 % (ref 11.5–15.5)
WBC: 9.2 10*3/uL (ref 4.0–10.5)
nRBC: 0 % (ref 0.0–0.2)

## 2020-07-24 LAB — COMPREHENSIVE METABOLIC PANEL
ALT: 36 U/L (ref 0–44)
AST: 35 U/L (ref 15–41)
Albumin: 5.4 g/dL — ABNORMAL HIGH (ref 3.5–5.0)
Alkaline Phosphatase: 107 U/L (ref 38–126)
Anion gap: 13 (ref 5–15)
BUN: 14 mg/dL (ref 6–20)
CO2: 28 mmol/L (ref 22–32)
Calcium: 10.2 mg/dL (ref 8.9–10.3)
Chloride: 99 mmol/L (ref 98–111)
Creatinine, Ser: 0.93 mg/dL (ref 0.61–1.24)
GFR, Estimated: >60 mL/min (ref >60)
Glucose, Bld: 72 mg/dL (ref 70–99)
Potassium: 3.8 mmol/L (ref 3.5–5.1)
Sodium: 140 mmol/L (ref 135–145)
Total Bilirubin: 0.6 mg/dL (ref 0.3–1.2)
Total Protein: 8.4 g/dL — ABNORMAL HIGH (ref 6.5–8.1)

## 2020-07-24 LAB — URINALYSIS, ROUTINE W REFLEX MICROSCOPIC
Bilirubin Urine: NEGATIVE
Glucose, UA: NEGATIVE mg/dL
Hgb urine dipstick: NEGATIVE
Ketones, ur: NEGATIVE mg/dL
Leukocytes,Ua: NEGATIVE
Nitrite: NEGATIVE
Protein, ur: NEGATIVE mg/dL
Specific Gravity, Urine: 1.026 (ref 1.005–1.030)
pH: 7.5 (ref 5.0–8.0)

## 2020-07-24 MED ORDER — HYDROMORPHONE HCL 1 MG/ML IJ SOLN
1.0000 mg | Freq: Once | INTRAMUSCULAR | Status: AC
  Administered 2020-07-24: 1 mg via INTRAVENOUS
  Filled 2020-07-24: qty 1

## 2020-07-24 MED ORDER — ONDANSETRON HCL 4 MG/2ML IJ SOLN
4.0000 mg | Freq: Once | INTRAMUSCULAR | Status: AC
  Administered 2020-07-24: 4 mg via INTRAVENOUS
  Filled 2020-07-24: qty 2

## 2020-07-24 MED ORDER — IOHEXOL 300 MG/ML  SOLN
100.0000 mL | Freq: Once | INTRAMUSCULAR | Status: AC | PRN
  Administered 2020-07-24: 100 mL via INTRAVENOUS

## 2020-07-24 MED ORDER — LACTATED RINGERS IV BOLUS
1000.0000 mL | Freq: Once | INTRAVENOUS | Status: AC
  Administered 2020-07-24: 1000 mL via INTRAVENOUS

## 2020-07-24 NOTE — Discharge Instructions (Signed)
CAT scan today and the blood work were normal.  There is no particular explanation for the pain you have had the last 2 weeks.

## 2020-07-24 NOTE — ED Triage Notes (Addendum)
Intermittent RLQ pain for 2 weeks, gradually worsening, it is the worst today with vomiting as well. Sent from PCP for eval.

## 2020-07-24 NOTE — Progress Notes (Signed)
Subjective:    Patient ID: Shane Landry., male    DOB: 1980-06-10, 40 y.o.   MRN: [id number]  HPI 40 year old male who  has a past medical history of Allergy, Anxiety, Bipolar disorder (HCC), Bronchitis, Crush injury lower leg, Depression, Gastric ulcer, GERD (gastroesophageal reflux disease), Hypertension, Migraine, Nerve pain, and RSD (reflex sympathetic dystrophy).    Review of Systems See HPI   Past Medical History:  Diagnosis Date  . Allergy   . Anxiety   . Bipolar disorder (HCC)   . Bronchitis   . Crush injury lower leg    Left lower leg  . Depression   . Gastric ulcer   . GERD (gastroesophageal reflux disease)    will awaken him in night  . Hypertension   . Migraine   . Nerve pain   . RSD (reflex sympathetic dystrophy)     Social History   Socioeconomic History  . Marital status: Married    Spouse name: Not on file  . Number of children: 1  . Years of education: 10th grade  . Highest education level: Not on file  Occupational History  . Occupation: Disabled    Comment: Previously worked Web designer  Tobacco Use  . Smoking status: Current Every Day Smoker    Packs/day: 0.50    Years: 23.00    Pack years: 11.50    Types: Cigarettes, E-cigarettes  . Smokeless tobacco: Never Used  Vaping Use  . Vaping Use: Never used  Substance and Sexual Activity  . Alcohol use: Yes    Alcohol/week: 6.0 standard drinks    Types: 6 Cans of beer per week    Comment: occasional  . Drug use: No  . Sexual activity: Yes    Partners: Female  Other Topics Concern  . Not on file  Social History Narrative   05/24/12  Darion was born and grew up in [place], [place].    He has 2 sisters.    He reports that his childhood was "lousy."    He completed the 10th grade.    He has been married for 5 years, and is currently separated for 3 weeks.    He has a daughter who is 74-1/2 years old.    He has been unemployed for 2 years, and is disabled due to  an on-the-job work accident.    He is currently living with his aunt and cousin.    He reports that his hobbies are sports and dogs.    He reports that he is spiritual, but not religious.    He states that his aunt and his father are his social support system.    He denies any current legal problems, but got a DUI in 2007. 05/24/12 [facility]      Social Determinants of Health   Financial Resource Strain: Not on file  Food Insecurity: Not on file  Transportation Needs: Not on file  Physical Activity: Not on file  Stress: Not on file  Social Connections: Not on file  Intimate Partner Violence: Not on file    Past Surgical History:  Procedure Laterality Date  . CHOLECYSTECTOMY N/A 01/24/2014   Procedure: LAPAROSCOPIC CHOLECYSTECTOMY;  Surgeon: Axel Filler, MD;  Location: [facility] OR;  Service: General;  Laterality: N/A;  . COLONOSCOPY    . HAND SURGERY    . MASS EXCISION Left 01/25/2013   Procedure: EXCISION MASS DORSAL ASPECT LEFT LONG FINGER DISTAL INTERPHALANGEAL JOINT;  Surgeon: Katy Fitch  Naaman Plummer., MD;  Location: [facility];  Service: Orthopedics;  Laterality: Left;  Left long   . MOUTH SURGERY      Family History  Problem Relation Age of Onset  . Hyperlipidemia Father   . Hypertension Father   . Anxiety disorder Father   . Drug abuse Father   . Cancer Paternal Grandfather        lung, colon  . Colon cancer Paternal Grandfather   . Lung cancer Paternal Grandfather   . Diabetes Paternal Grandmother   . Cancer Paternal Grandmother   . Cancer Maternal Grandmother   . Cancer Maternal Grandfather   . Lung cancer Maternal Grandfather   . Stroke Other   . Heart disease Other   . Depression Paternal Aunt   . Anxiety disorder Paternal Aunt   . Drug abuse Paternal Uncle   . Colon cancer Paternal Uncle   . Esophageal cancer Neg Hx   . Stomach cancer Neg Hx   . Rectal cancer Neg Hx     Allergies  Allergen Reactions  . Aspirin Other (See Comments) and Tinitus     Tinnitus, dizzy, short of breath  . Codeine Itching    Reaction to tylenol #3  . Penicillins Anaphylaxis and Other (See Comments)    Was told never to take medication.  Has patient had a PCN reaction causing immediate rash, facial/tongue/throat swelling, SOB or lightheadedness with hypotension: no Has patient had a PCN reaction causing severe rash involving mucus membranes or skin necrosis: no Has patient had a PCN reaction that required hospitalization: no Has patient had a PCN reaction occurring within the last 10 years: no If all of the above answers are "NO", then may proceed with Cephalosporin use.   . Tramadol     seizure Other reaction(s): Seizures  . Amitriptyline Rash    Current Outpatient Medications on File Prior to Visit  Medication Sig Dispense Refill  . acetaminophen (TYLENOL) 500 MG tablet Take 1,000-1,500 mg by mouth every 6 (six) hours as needed for moderate pain.    . famotidine (PEPCID) 20 MG tablet Take 20 mg by mouth 2 (two) times daily.    Marland Kitchen gabapentin (NEURONTIN) 300 MG capsule Take 1 capsule (300 mg total) by mouth 3 (three) times daily. (Patient taking differently: Take 600 mg by mouth 3 (three) times daily.) 270 capsule 3  . ibuprofen (ADVIL,MOTRIN) 800 MG tablet   3  . lidocaine (LINDAMANTLE) 3 % CREA cream Apply 1 application topically daily as needed (chronic pain).   3  . Multiple Vitamin (MULTIVITAMIN WITH MINERALS) TABS tablet Take 1 tablet by mouth daily.    . Nerve Stimulator (TENS THERAPY PAIN RELIEF) DEVI 1 application by Does not apply route daily as needed (chronic pain).    . nortriptyline (PAMELOR) 75 MG capsule Take 1 capsule (75 mg total) by mouth at bedtime. 90 capsule 1  . ondansetron (ZOFRAN ODT) 4 MG disintegrating tablet Take 1 tablet (4 mg total) by mouth every 8 (eight) hours as needed for nausea or vomiting. 20 tablet 2  . oxyCODONE (OXY IR/ROXICODONE) 5 MG immediate release tablet TAKE 1 TABLET BY MOUTH EVERY 6 HOURS AS NEEDED FOR SEVERE  PAIN 25 tablet 0  . predniSONE (DELTASONE) 20 MG tablet TAKE 1 TABLET BY MOUTH ONCE A DAY WITH BREAKFAST FOR 10 DAYS 10 tablet 0  . sildenafil (REVATIO) 20 MG tablet TAKE 1 TABLET BY MOUTH 2 TIMES DAILY 180 tablet 0  . tiZANidine (ZANAFLEX) 4 MG  tablet Take 1 tablet by mouth 3 (three) times daily as needed.  6  . tiZANidine (ZANAFLEX) 4 MG tablet TAKE 1 TABLET BY MOUTH 3 TIMES DAILY 90 tablet 0   No current facility-administered medications on file prior to visit.    BP (!) 140/92 (BP Location: Left Arm, Patient Position: Sitting, Cuff Size: Normal)   Pulse 100   Temp 99.5 F (37.5 C) (Oral)   Ht 5' 9.5" (1.765 m)   Wt 205 lb (93 kg)   SpO2 99%   BMI 29.84 kg/m       Objective:   Physical Exam Vitals and nursing note reviewed.  Constitutional:      Appearance: He is well-developed.  Abdominal:     General: Bowel sounds are normal.     Palpations: Abdomen is soft.     Tenderness: There is abdominal tenderness.  Genitourinary:    Rectum: Normal.  Skin:    General: Skin is warm and dry.  Neurological:     Mental Status: He is alert.           Assessment & Plan:

## 2020-07-24 NOTE — Progress Notes (Signed)
Subjective:    Patient ID: Shane Cole., male    DOB: [birth year], 39 y.o.   MRN: [id number]  HPI  40 year old male who  has a past medical history of Allergy, Anxiety, Bipolar disorder (HCC), Bronchitis, Crush injury lower leg, Depression, Gastric ulcer, GERD (gastroesophageal reflux disease), Hypertension, Migraine, Nerve pain, and RSD (reflex sympathetic dystrophy).  He presents to the office today for acute right lower quadrant abdominal pain.  Reports nausea, vomiting, right lower quadrant abdominal pain that feels as though it is a "stabbing pain" x2 weeks.  Over the last 2 to 3 days he has also had a grade fever up to 100.5.  Associated symptoms include abdominal distention  History of cholecystomy   Review of Systems  Constitutional: Negative.   Respiratory: Negative.   Cardiovascular: Negative.   Gastrointestinal: Positive for abdominal distention, abdominal pain, nausea and vomiting. Negative for blood in stool, constipation and diarrhea.  Genitourinary: Negative.   All other systems reviewed and are negative.  Past Medical History:  Diagnosis Date  . Allergy   . Anxiety   . Bipolar disorder (HCC)   . Bronchitis   . Crush injury lower leg    Left lower leg  . Depression   . Gastric ulcer   . GERD (gastroesophageal reflux disease)    will awaken him in night  . Hypertension   . Migraine   . Nerve pain   . RSD (reflex sympathetic dystrophy)     Social History   Socioeconomic History  . Marital status: Married    Spouse name: Not on file  . Number of children: 1  . Years of education: 10th grade  . Highest education level: Not on file  Occupational History  . Occupation: Disabled    Comment: Previously worked Web designer  Tobacco Use  . Smoking status: Current Every Day Smoker    Packs/day: 0.50    Years: 23.00    Pack years: 11.50    Types: Cigarettes, E-cigarettes  . Smokeless tobacco: Never Used  Vaping Use  . Vaping Use:  Never used  Substance and Sexual Activity  . Alcohol use: Yes    Alcohol/week: 6.0 standard drinks    Types: 6 Cans of beer per week    Comment: occasional  . Drug use: No  . Sexual activity: Yes    Partners: Female  Other Topics Concern  . Not on file  Social History Narrative   05/24/12  [name] was born and grew up in [place], [place].    He has 2 sisters.    He reports that his childhood was "lousy."    He completed the 10th grade.    He has been married for 5 years, and is currently separated for 3 weeks.    He has a daughter who is 33-1/2 years old.    He has been unemployed for 2 years, and is disabled due to an on-the-job work accident.    He is currently living with his aunt and cousin.    He reports that his hobbies are sports and dogs.    He reports that he is spiritual, but not religious.    He states that his aunt and his father are his social support system.    He denies any current legal problems, but got a DUI in 2007. 05/24/12 [facility]      Social Determinants of Health   Financial Resource Strain: Not on file  Food Insecurity:  Not on file  Transportation Needs: Not on file  Physical Activity: Not on file  Stress: Not on file  Social Connections: Not on file  Intimate Partner Violence: Not on file    Past Surgical History:  Procedure Laterality Date  . CHOLECYSTECTOMY N/A 01/24/2014   Procedure: LAPAROSCOPIC CHOLECYSTECTOMY;  Surgeon: Axel Filler, MD;  Location: [facility] OR;  Service: General;  Laterality: N/A;  . COLONOSCOPY    . HAND SURGERY    . MASS EXCISION Left 01/25/2013   Procedure: EXCISION MASS DORSAL ASPECT LEFT LONG FINGER DISTAL INTERPHALANGEAL JOINT;  Surgeon: Wyn Forster., MD;  Location: [facility];  Service: Orthopedics;  Laterality: Left;  Left long   . MOUTH SURGERY      Family History  Problem Relation Age of Onset  . Hyperlipidemia Father   . Hypertension Father   . Anxiety disorder Father   . Drug abuse Father   .  Cancer Paternal Grandfather        lung, colon  . Colon cancer Paternal Grandfather   . Lung cancer Paternal Grandfather   . Diabetes Paternal Grandmother   . Cancer Paternal Grandmother   . Cancer Maternal Grandmother   . Cancer Maternal Grandfather   . Lung cancer Maternal Grandfather   . Stroke Other   . Heart disease Other   . Depression Paternal Aunt   . Anxiety disorder Paternal Aunt   . Drug abuse Paternal Uncle   . Colon cancer Paternal Uncle   . Esophageal cancer Neg Hx   . Stomach cancer Neg Hx   . Rectal cancer Neg Hx     Allergies  Allergen Reactions  . Aspirin Other (See Comments) and Tinitus    Tinnitus, dizzy, short of breath  . Codeine Itching    Reaction to tylenol #3  . Penicillins Anaphylaxis and Other (See Comments)    Was told never to take medication.  Has patient had a PCN reaction causing immediate rash, facial/tongue/throat swelling, SOB or lightheadedness with hypotension: no Has patient had a PCN reaction causing severe rash involving mucus membranes or skin necrosis: no Has patient had a PCN reaction that required hospitalization: no Has patient had a PCN reaction occurring within the last 10 years: no If all of the above answers are "NO", then may proceed with Cephalosporin use.   . Tramadol     seizure Other reaction(s): Seizures  . Amitriptyline Rash    Current Outpatient Medications on File Prior to Visit  Medication Sig Dispense Refill  . acetaminophen (TYLENOL) 500 MG tablet Take 1,000-1,500 mg by mouth every 6 (six) hours as needed for moderate pain.    . famotidine (PEPCID) 20 MG tablet Take 20 mg by mouth 2 (two) times daily.    Marland Kitchen gabapentin (NEURONTIN) 300 MG capsule Take 1 capsule (300 mg total) by mouth 3 (three) times daily. (Patient taking differently: Take 600 mg by mouth 3 (three) times daily.) 270 capsule 3  . ibuprofen (ADVIL,MOTRIN) 800 MG tablet   3  . lidocaine (LINDAMANTLE) 3 % CREA cream Apply 1 application topically  daily as needed (chronic pain).   3  . Multiple Vitamin (MULTIVITAMIN WITH MINERALS) TABS tablet Take 1 tablet by mouth daily.    . Nerve Stimulator (TENS THERAPY PAIN RELIEF) DEVI 1 application by Does not apply route daily as needed (chronic pain).    . nortriptyline (PAMELOR) 75 MG capsule Take 1 capsule (75 mg total) by mouth at bedtime. 90 capsule 1  .  ondansetron (ZOFRAN ODT) 4 MG disintegrating tablet Take 1 tablet (4 mg total) by mouth every 8 (eight) hours as needed for nausea or vomiting. 20 tablet 2  . oxyCODONE (OXY IR/ROXICODONE) 5 MG immediate release tablet TAKE 1 TABLET BY MOUTH EVERY 6 HOURS AS NEEDED FOR SEVERE PAIN 25 tablet 0  . predniSONE (DELTASONE) 20 MG tablet TAKE 1 TABLET BY MOUTH ONCE A DAY WITH BREAKFAST FOR 10 DAYS 10 tablet 0  . sildenafil (REVATIO) 20 MG tablet TAKE 1 TABLET BY MOUTH 2 TIMES DAILY 180 tablet 0  . tiZANidine (ZANAFLEX) 4 MG tablet Take 1 tablet by mouth 3 (three) times daily as needed.  6  . tiZANidine (ZANAFLEX) 4 MG tablet TAKE 1 TABLET BY MOUTH 3 TIMES DAILY 90 tablet 0   No current facility-administered medications on file prior to visit.    BP (!) 140/92 (BP Location: Left Arm, Patient Position: Sitting, Cuff Size: Normal)   Pulse 100   Temp 99.5 F (37.5 C) (Oral)   Ht 5' 9.5" (1.765 m)   Wt 205 lb (93 kg)   SpO2 99%   BMI 29.84 kg/m       Objective:   Physical Exam Constitutional:      General: He is in acute distress.     Appearance: He is well-developed and normal weight.  Abdominal:     General: Bowel sounds are normal.     Tenderness: There is abdominal tenderness in the right lower quadrant, periumbilical area and left lower quadrant. There is no rebound. Positive signs include McBurney's sign, psoas sign (?) and obturator sign (?). Negative signs include Murphy's sign.     Hernia: No hernia is present.  Skin:    General: Skin is warm and dry.  Neurological:     General: No focal deficit present.     Mental Status: He  is alert.  Psychiatric:        Mood and Affect: Mood normal.        Behavior: Behavior normal.       Assessment & Plan:  1. Right lower quadrant abdominal pain - Concern for appendicitis - Attempted to get STAT CT abdomen/Pelvis but it was too late in the day  - Patient will go to the ER for further evaluation   Shirline Frees, NP

## 2020-07-24 NOTE — ED Provider Notes (Signed)
[facility] EMERGENCY DEPT Provider Note   CSN: [id number] Arrival date & time: 07/24/20  1555     History Chief Complaint  Patient presents with  . Abdominal Pain    Shane Cole. is a 40 y.o. male.  Patient is a 40 year old male with a history of reflex sympathetic dystrophy, chronic epigastric abdominal pain, chronic opiate use, gastric ulcer, prior cholecystectomy who is presenting today with right-sided abdominal pain.  Patient reports the right lower quadrant pain has been present for approximately 2 weeks and has been gradually getting worse.  He has had some intermittent vomiting over the last 2 weeks and has been eating very little.  Today the pain has gotten significantly worse and he went to his doctor and after evaluation they sent him here for further evaluation.  Patient currently is rating the pain greater than 10 out of 10.  It feels like a knife stabbing him but it does not radiate.  He denies fever.  Last bowel movement was 2 days ago and he reports no flatus today.  No urinary changes.  He has been taking his home pain medication and Zofran at home without improvement in his symptoms.  The history is provided by the patient.  Abdominal Pain Pain location:  RLQ      Past Medical History:  Diagnosis Date  . Allergy   . Anxiety   . Bipolar disorder (HCC)   . Bronchitis   . Crush injury lower leg    Left lower leg  . Depression   . Gastric ulcer   . GERD (gastroesophageal reflux disease)    will awaken him in night  . Hypertension   . Migraine   . Nerve pain   . RSD (reflex sympathetic dystrophy)     Patient Active Problem List   Diagnosis Date Noted  . GERD (gastroesophageal reflux disease) 07/16/2016  . Internal hemorrhoid 07/16/2016  . Abdominal pain, chronic, epigastric 07/16/2016  . Rectal bleeding 07/16/2016  . Essential hypertension 02/26/2016  . Adhesive capsulitis of right shoulder 07/16/2015  . Thoracic outlet syndrome  02/12/2015  . Chronic narcotic use 10/26/2014  . Opioid contract exists 10/26/2014  . RSD lower limb 06/30/2014  . Tobacco use disorder 05/11/2014  . Chronic diarrhea 03/29/2012  . Bipolar disorder (HCC) 03/29/2012    Past Surgical History:  Procedure Laterality Date  . CHOLECYSTECTOMY N/A 01/24/2014   Procedure: LAPAROSCOPIC CHOLECYSTECTOMY;  Surgeon: Axel Filler, MD;  Location: [facility] OR;  Service: General;  Laterality: N/A;  . COLONOSCOPY    . HAND SURGERY    . MASS EXCISION Left 01/25/2013   Procedure: EXCISION MASS DORSAL ASPECT LEFT LONG FINGER DISTAL INTERPHALANGEAL JOINT;  Surgeon: Wyn Forster., MD;  Location: [facility];  Service: Orthopedics;  Laterality: Left;  Left long   . MOUTH SURGERY         Family History  Problem Relation Age of Onset  . Hyperlipidemia Father   . Hypertension Father   . Anxiety disorder Father   . Drug abuse Father   . Cancer Paternal Grandfather        lung, colon  . Colon cancer Paternal Grandfather   . Lung cancer Paternal Grandfather   . Diabetes Paternal Grandmother   . Cancer Paternal Grandmother   . Cancer Maternal Grandmother   . Cancer Maternal Grandfather   . Lung cancer Maternal Grandfather   . Stroke Other   . Heart disease Other   . Depression Paternal Aunt   .  Anxiety disorder Paternal Aunt   . Drug abuse Paternal Uncle   . Colon cancer Paternal Uncle   . Esophageal cancer Neg Hx   . Stomach cancer Neg Hx   . Rectal cancer Neg Hx     Social History   Tobacco Use  . Smoking status: Current Every Day Smoker    Packs/day: 0.50    Years: 23.00    Pack years: 11.50    Types: Cigarettes, E-cigarettes  . Smokeless tobacco: Never Used  Vaping Use  . Vaping Use: Never used  Substance Use Topics  . Alcohol use: Yes    Alcohol/week: 6.0 standard drinks    Types: 6 Cans of beer per week    Comment: occasional  . Drug use: No    Home Medications Prior to Admission medications   Medication Sig  Start Date End Date Taking? Authorizing Provider  acetaminophen (TYLENOL) 500 MG tablet Take 1,000-1,500 mg by mouth every 6 (six) hours as needed for moderate pain.    [provider]  famotidine (PEPCID) 20 MG tablet Take 20 mg by mouth 2 (two) times daily.    [provider]  gabapentin (NEURONTIN) 300 MG capsule Take 1 capsule (300 mg total) by mouth 3 (three) times daily. Patient taking differently: Take 600 mg by mouth 3 (three) times daily. 02/26/16   Nafziger, Kandee Keenory, NP  ibuprofen (ADVIL,MOTRIN) 800 MG tablet  10/30/17   [provider]  lidocaine (LINDAMANTLE) 3 % CREA cream Apply 1 application topically daily as needed (chronic pain).  12/11/16   [provider]  Multiple Vitamin (MULTIVITAMIN WITH MINERALS) TABS tablet Take 1 tablet by mouth daily.    [provider]  Nerve Stimulator (TENS THERAPY PAIN RELIEF) DEVI 1 application by Does not apply route daily as needed (chronic pain).    [provider]  nortriptyline (PAMELOR) 75 MG capsule Take 1 capsule (75 mg total) by mouth at bedtime. 06/17/17   Nafziger, Kandee Keenory, NP  ondansetron (ZOFRAN ODT) 4 MG disintegrating tablet Take 1 tablet (4 mg total) by mouth every 8 (eight) hours as needed for nausea or vomiting. 02/10/19   Nafziger, Kandee Keenory, NP  oxyCODONE (OXY IR/ROXICODONE) 5 MG immediate release tablet TAKE 1 TABLET BY MOUTH EVERY 6 HOURS AS NEEDED FOR SEVERE PAIN 07/13/20 01/09/21  Nafziger, Kandee Keenory, NP  predniSONE (DELTASONE) 20 MG tablet TAKE 1 TABLET BY MOUTH ONCE A DAY WITH BREAKFAST FOR 10 DAYS 06/28/20 06/28/21  Nafziger, Kandee Keenory, NP  sildenafil (REVATIO) 20 MG tablet TAKE 1 TABLET BY MOUTH 2 TIMES DAILY 05/11/20 05/11/21  Nafziger, Kandee Keenory, NP  tiZANidine (ZANAFLEX) 4 MG tablet Take 1 tablet by mouth 3 (three) times daily as needed. 05/13/17   [provider]  tiZANidine (ZANAFLEX) 4 MG tablet TAKE 1 TABLET BY MOUTH 3 TIMES DAILY 07/13/20 07/13/21  Shirline FreesNafziger, Cory, NP    Allergies    Aspirin,  Codeine, Penicillins, Tramadol, and Amitriptyline  Review of Systems   Review of Systems  Gastrointestinal: Positive for abdominal pain.  All other systems reviewed and are negative.   Physical Exam Updated Vital Signs BP (!) 146/100 (BP Location: Left Arm)   Pulse (!) 103   Temp 99.1 F (37.3 C) (Oral)   Resp 20   Ht 5\' 10"  (1.778 m)   Wt 93.4 kg   SpO2 100%   BMI 29.56 kg/m   Physical Exam Vitals and nursing note reviewed.  Constitutional:      General: He is not in acute distress.  Appearance: He is well-developed.  HENT:     Head: Normocephalic and atraumatic.     Nose: Nose normal.     Mouth/Throat:     Mouth: Mucous membranes are dry.  Eyes:     Conjunctiva/sclera: Conjunctivae normal.     Pupils: Pupils are equal, round, and reactive to light.  Cardiovascular:     Rate and Rhythm: Regular rhythm. Tachycardia present.     Pulses: Normal pulses.     Heart sounds: No murmur heard.   Pulmonary:     Effort: Pulmonary effort is normal. No respiratory distress.     Breath sounds: Normal breath sounds. No wheezing or rales.  Abdominal:     General: There is no distension.     Palpations: Abdomen is soft.     Tenderness: There is abdominal tenderness in the right lower quadrant. There is guarding. There is no right CVA tenderness, left CVA tenderness or rebound.  Musculoskeletal:        General: No tenderness. Normal range of motion.     Cervical back: Normal range of motion and neck supple.     Right lower leg: No edema.     Left lower leg: No edema.  Skin:    General: Skin is warm and dry.     Findings: No erythema or rash.  Neurological:     Mental Status: He is alert and oriented to person, place, and time. Mental status is at baseline.  Psychiatric:        Mood and Affect: Mood normal.        Behavior: Behavior normal.     ED Results / Procedures / Treatments   Labs (all labs ordered are listed, but only abnormal results are displayed) Labs  Reviewed  COMPREHENSIVE METABOLIC PANEL - Abnormal; Notable for the following components:      Result Value   Total Protein 8.4 (*)    Albumin 5.4 (*)    All other components within normal limits  CBC WITH DIFFERENTIAL/PLATELET  URINALYSIS, ROUTINE W REFLEX MICROSCOPIC    EKG None  Radiology CT ABDOMEN PELVIS W CONTRAST  Result Date: 07/24/2020 CLINICAL DATA:  RIGHT lower quadrant abdominal pain suspected appendicitis in a 40 year old male. EXAM: CT ABDOMEN AND PELVIS WITH CONTRAST TECHNIQUE: Multidetector CT imaging of the abdomen and pelvis was performed using the standard protocol following bolus administration of intravenous contrast. CONTRAST:  OMNIPAQUE IOHEXOL 300 MG/ML  SOLN COMPARISON:  July 01, 2017. FINDINGS: Lower chest: Minimal basilar atelectasis. No effusion. No consolidation. Fat and approximately 30-40% of the stomach herniating into the chest. Increased size of gastric portion of hiatal hernia since previous imaging. Hepatobiliary: Normal post cholecystectomy appearance of liver and biliary tree. Portal vein is patent. Pancreas: Normal, without mass, inflammation or ductal dilatation. Spleen: Normal Adrenals/Urinary Tract: Adrenal glands are normal. Symmetric renal enhancement. No hydronephrosis. Smooth contour of the urinary bladder. No perivesical stranding. No suspicious renal lesion. Stomach/Bowel: Hiatal hernia as above moderately large with much of the stomach herniated in the chest. Sliding and potentially with paraesophageal component as well, mixed type. No perigastric stranding. Scattered fluid-filled loops of small bowel. No sign of obstruction or adjacent inflammation. The appendix is normal. Stool fills much of the colon. No pericolonic stranding. Vascular/Lymphatic: Normal caliber abdominal aorta. Smooth contour of the IVC. There is no gastrohepatic or hepatoduodenal ligament lymphadenopathy. No retroperitoneal or mesenteric lymphadenopathy. No pelvic sidewall  lymphadenopathy. Reproductive: Unremarkable Other: No free air.  No ascites. Musculoskeletal: No acute musculoskeletal process.  AVN of the bilateral femoral heads. IMPRESSION: 1. Increased size of hiatal hernia with sliding and paraesophageal component. Approximately 30-40% of the stomach herniated into the chest. 2. Scattered fluid-filled loops of small bowel. No sign of obstruction or adjacent inflammation. Findings may represent sequela of mild gastroenteritis. 3. Normal appendix. 4. Signs of bilateral femoral avascular necrosis. No signs of collapse. Findings not evident on previous imaging from 2019. Correlation with risk factors for avascular necrosis is suggested. Electronically Signed   By: Donzetta Kohut M.D.   On: 07/24/2020 18:33    Procedures Procedures   Medications Ordered in ED Medications  HYDROmorphone (DILAUDID) injection 1 mg (has no administration in time range)  ondansetron (ZOFRAN) injection 4 mg (has no administration in time range)  lactated ringers bolus 1,000 mL (has no administration in time range)    ED Course  I have reviewed the triage vital signs and the nursing notes.  Pertinent labs & imaging results that were available during my care of the patient were reviewed by me and considered in my medical decision making (see chart for details).    MDM Rules/Calculators/A&P                          40 year old gentleman presenting today with abdominal pain that has now been present for 2 weeks.  It has been gradually worsening with vomiting but denies any fever.  Last bowel movement was 2 days ago and he has not had any flatus in the last 24 hours.  He has had prior cholecystectomy.  Patient reports that he does get some abdominal pain but it has never been this bad before.  Concern for possible appendicitis, kidney stone, bowel obstruction, mass, right-sided diverticulitis.  Labs and CT are pending.  Patient given IV fluid and pain control.  6:57 PM Patient's CBC,  CMP are within normal limits.  CT shows increased size of hiatal hernia and scattered fluid-filled loops of small bowel without signs of obstruction or adjacent inflammation.  Could represent sequelae of mild gastroenteritis.  Appendix is normal.  UA is still pending but findings discussed with the patient.  He appears more comfortable at this time.  8:32 PM UA wnl.  Will d/c pt to f/u with GI if not resolved.  MDM Number of Diagnoses or Management Options   Amount and/or Complexity of Data Reviewed Clinical lab tests: ordered and reviewed Tests in the radiology section of CPT: ordered and reviewed Independent visualization of images, tracings, or specimens: yes     Final Clinical Impression(s) / ED Diagnoses Final diagnoses:  Right-sided abdominal pain of unknown cause    Rx / DC Orders ED Discharge Orders    None       Gwyneth Sprout, MD 07/24/20 2033

## 2020-07-25 ENCOUNTER — Telehealth: Payer: Self-pay | Admitting: Adult Health

## 2020-07-25 ENCOUNTER — Other Ambulatory Visit (HOSPITAL_COMMUNITY): Payer: Self-pay

## 2020-07-25 ENCOUNTER — Other Ambulatory Visit: Payer: Self-pay | Admitting: Adult Health

## 2020-07-25 MED ORDER — ONDANSETRON 4 MG PO TBDP
4.0000 mg | ORAL_TABLET | Freq: Three times a day (TID) | ORAL | 2 refills | Status: DC | PRN
  Filled 2020-07-25: qty 20, 7d supply, fill #0

## 2020-07-25 MED ORDER — OXYCODONE HCL 5 MG PO TABS
5.0000 mg | ORAL_TABLET | Freq: Four times a day (QID) | ORAL | 0 refills | Status: DC
  Filled 2020-07-25: qty 20, 5d supply, fill #0

## 2020-07-25 MED ORDER — LIDOCAINE HCL 3 % EX CREA
1.0000 "application " | TOPICAL_CREAM | Freq: Every day | CUTANEOUS | 1 refills | Status: DC | PRN
  Filled 2020-07-25: qty 28.35, 14d supply, fill #0

## 2020-07-25 NOTE — Telephone Encounter (Signed)
Pt call and stated he need refills on lidocaine (LINDAMANTLE) 3 % CREA cream , oxyCODONE (OXY IR/ROXICODONE) 5 MG immediate release tablet and  ondansetron (ZOFRAN ODT) 4 MG disintegrating tablet sent to  [facility] Phone:  [phone]  Fax:  [phone]

## 2020-07-26 ENCOUNTER — Other Ambulatory Visit (HOSPITAL_COMMUNITY): Payer: Self-pay

## 2020-07-26 ENCOUNTER — Encounter: Payer: Self-pay | Admitting: Adult Health

## 2020-07-26 ENCOUNTER — Telehealth (INDEPENDENT_AMBULATORY_CARE_PROVIDER_SITE_OTHER): Payer: No Typology Code available for payment source | Admitting: Adult Health

## 2020-07-26 VITALS — Temp 97.7°F | Wt 204.0 lb

## 2020-07-26 DIAGNOSIS — R1031 Right lower quadrant pain: Secondary | ICD-10-CM

## 2020-07-26 DIAGNOSIS — R112 Nausea with vomiting, unspecified: Secondary | ICD-10-CM | POA: Diagnosis not present

## 2020-07-26 DIAGNOSIS — M8700 Idiopathic aseptic necrosis of unspecified bone: Secondary | ICD-10-CM | POA: Diagnosis not present

## 2020-07-26 DIAGNOSIS — K2100 Gastro-esophageal reflux disease with esophagitis, without bleeding: Secondary | ICD-10-CM | POA: Diagnosis not present

## 2020-07-26 DIAGNOSIS — K449 Diaphragmatic hernia without obstruction or gangrene: Secondary | ICD-10-CM

## 2020-07-26 MED ORDER — CARESTART COVID-19 HOME TEST VI KIT
PACK | 0 refills | Status: DC
  Filled 2020-07-26: qty 4, 4d supply, fill #0

## 2020-07-26 MED ORDER — PANTOPRAZOLE SODIUM 40 MG PO TBEC
40.0000 mg | DELAYED_RELEASE_TABLET | Freq: Every day | ORAL | 0 refills | Status: DC
  Filled 2020-07-26: qty 30, 30d supply, fill #0

## 2020-07-26 NOTE — Progress Notes (Signed)
Virtual Visit via Video Note  I connected with Lisbeth Ply on 07/26/20 at 11:00 AM EDT by a video enabled telemedicine application and verified that I am speaking with the correct person using two identifiers.  Location patient: home Location provider:work or home office Persons participating in the virtual visit: patient, provider  I discussed the limitations of evaluation and management by telemedicine and the availability of in person appointments. The patient expressed understanding and agreed to proceed.   HPI: 40 year old male who is being evaluated today for follow-up after being seen in this office and being referred to the emergency room 2 days ago for right lower quadrant pain, nausea and vomiting.   Work-up in the emergency room was within normal limits.  CT scan showed IMPRESSION: 1. Increased size of hiatal hernia with sliding and paraesophageal component. Approximately 30-40% of the stomach herniated into the chest. 2. Scattered fluid-filled loops of small bowel. No sign of obstruction or adjacent inflammation. Findings may represent sequela of mild gastroenteritis. 3. Normal appendix. 4. Signs of bilateral femoral avascular necrosis. No signs of collapse. Findings not evident on previous imaging from 2019. Correlation with risk factors for avascular necrosis is suggested.  He was hydrated and given IV pain medication, symptoms improved and he was discharged home.  Today he reports that his abdominal pain is better while resting but with movement he continues to have pretty significant right lower/and umbilical abdominal pain.  Also reports that he continues to have episodes of vomiting after p.o. intake, and nausea despite taking Zofran.  He is also been having to take Pepcid due to worsening acid reflux.   ROS: See pertinent positives and negatives per HPI.  Past Medical History:  Diagnosis Date  . Allergy   . Anxiety   . Bipolar disorder (HCC)   . Bronchitis    . Crush injury lower leg    Left lower leg  . Depression   . Gastric ulcer   . GERD (gastroesophageal reflux disease)    will awaken him in night  . Hypertension   . Migraine   . Nerve pain   . RSD (reflex sympathetic dystrophy)     Past Surgical History:  Procedure Laterality Date  . CHOLECYSTECTOMY N/A 01/24/2014   Procedure: LAPAROSCOPIC CHOLECYSTECTOMY;  Surgeon: Axel Filler, MD;  Location: [facility] OR;  Service: General;  Laterality: N/A;  . COLONOSCOPY    . HAND SURGERY    . MASS EXCISION Left 01/25/2013   Procedure: EXCISION MASS DORSAL ASPECT LEFT LONG FINGER DISTAL INTERPHALANGEAL JOINT;  Surgeon: Wyn Forster., MD;  Location: [facility];  Service: Orthopedics;  Laterality: Left;  Left long   . MOUTH SURGERY      Family History  Problem Relation Age of Onset  . Hyperlipidemia Father   . Hypertension Father   . Anxiety disorder Father   . Drug abuse Father   . Cancer Paternal Grandfather        lung, colon  . Colon cancer Paternal Grandfather   . Lung cancer Paternal Grandfather   . Diabetes Paternal Grandmother   . Cancer Paternal Grandmother   . Cancer Maternal Grandmother   . Cancer Maternal Grandfather   . Lung cancer Maternal Grandfather   . Stroke Other   . Heart disease Other   . Depression Paternal Aunt   . Anxiety disorder Paternal Aunt   . Drug abuse Paternal Uncle   . Colon cancer Paternal Uncle   . Esophageal cancer Neg Hx   .  Stomach cancer Neg Hx   . Rectal cancer Neg Hx        Current Outpatient Medications:  .  acetaminophen (TYLENOL) 500 MG tablet, Take 1,000-1,500 mg by mouth every 6 (six) hours as needed for moderate pain., Disp: , Rfl:  .  famotidine (PEPCID) 20 MG tablet, Take 20 mg by mouth 2 (two) times daily., Disp: , Rfl:  .  gabapentin (NEURONTIN) 300 MG capsule, Take 1 capsule (300 mg total) by mouth 3 (three) times daily. (Patient taking differently: Take 600 mg by mouth 3 (three) times daily.), Disp: 270  capsule, Rfl: 3 .  ibuprofen (ADVIL,MOTRIN) 800 MG tablet, , Disp: , Rfl: 3 .  lidocaine (LINDAMANTLE) 3 % CREA cream, Apply 1 application topically daily as needed (chronic pain)., Disp: 28.35 g, Rfl: 1 .  Multiple Vitamin (MULTIVITAMIN WITH MINERALS) TABS tablet, Take 1 tablet by mouth daily., Disp: , Rfl:  .  Nerve Stimulator (TENS THERAPY PAIN RELIEF) DEVI, 1 application by Does not apply route daily as needed (chronic pain)., Disp: , Rfl:  .  nortriptyline (PAMELOR) 75 MG capsule, Take 1 capsule (75 mg total) by mouth at bedtime., Disp: 90 capsule, Rfl: 1 .  ondansetron (ZOFRAN ODT) 4 MG disintegrating tablet, Dissolve 1 tablet (4 mg total) by mouth every 8 (eight) hours as needed for nausea or vomiting., Disp: 20 tablet, Rfl: 2 .  oxyCODONE (OXY IR/ROXICODONE) 5 MG immediate release tablet, Take 1 tablet (5 mg total) by mouth every 6 (six) hours for 5 days., Disp: 20 tablet, Rfl: 0 .  predniSONE (DELTASONE) 20 MG tablet, TAKE 1 TABLET BY MOUTH ONCE A DAY WITH BREAKFAST FOR 10 DAYS, Disp: 10 tablet, Rfl: 0 .  sildenafil (REVATIO) 20 MG tablet, TAKE 1 TABLET BY MOUTH 2 TIMES DAILY, Disp: 180 tablet, Rfl: 0 .  tiZANidine (ZANAFLEX) 4 MG tablet, Take 1 tablet by mouth 3 (three) times daily as needed., Disp: , Rfl: 6 .  tiZANidine (ZANAFLEX) 4 MG tablet, TAKE 1 TABLET BY MOUTH 3 TIMES DAILY, Disp: 90 tablet, Rfl: 0  EXAM:  VITALS per patient if applicable:  GENERAL: alert, oriented, appears in pain with movement   HEENT: atraumatic, conjunttiva clear, no obvious abnormalities on inspection of external nose and ears  NECK: normal movements of the head and neck  LUNGS: on inspection no signs of respiratory distress, breathing rate appears normal, no obvious gross SOB, gasping or wheezing  CV: no obvious cyanosis  MS: moves all visible extremities without noticeable abnormality  PSYCH/NEURO: pleasant and cooperative, no obvious depression or anxiety, speech and thought processing grossly  intact  ASSESSMENT AND PLAN:  Discussed the following assessment and plan:  1. Right lower quadrant abdominal pain  - Ambulatory referral to Gastroenterology  2. Avascular necrosis (HCC) - incidental finding on CT scan. Likely from long term prednisone use over the years - Stop taking prednisone  - AMB referral to orthopedics  3. Nausea and vomiting, intractability of vomiting not specified, unspecified vomiting type - bland diet - continue with zofran  - Ambulatory referral to Gastroenterology  4. Gastroesophageal reflux disease with esophagitis without hemorrhage  - Ambulatory referral to Gastroenterology - pantoprazole (PROTONIX) 40 MG tablet; Take 1 tablet (40 mg total) by mouth daily.  Dispense: 30 tablet; Refill: 0  5. Hiatal hernia  - pantoprazole (PROTONIX) 40 MG tablet; Take 1 tablet (40 mg total) by mouth daily.  Dispense: 30 tablet; Refill: 0    I discussed the assessment and treatment plan with the  patient. The patient was provided an opportunity to ask questions and all were answered. The patient agreed with the plan and demonstrated an understanding of the instructions.   The patient was advised to call back or seek an in-person evaluation if the symptoms worsen or if the condition fails to improve as anticipated.   Dorothyann Peng, NP

## 2020-07-29 NOTE — Progress Notes (Signed)
Patient: Shane Cole.  Service Category: E/M  Provider: Gaspar Cola, MD  DOB: 1980-10-14  DOS: 07/30/2020  Referring Provider: Dorothyann Peng, NP  MRN: [id number]  Setting: Ambulatory outpatient  PCP: Dorothyann Peng, NP  Type: New Patient  Specialty: Interventional Pain Management    Location: Office  Delivery: Face-to-face     Primary Reason(s) for Visit: Encounter for initial evaluation of one or more chronic problems (new to examiner) potentially causing chronic pain, and posing a threat to normal musculoskeletal function. (Level of risk: High) CC: Foot Pain (left), Hip Pain (right), and Back Pain (low)  HPI  [name] is a 40 y.o. year old, male patient, who comes for the first time to our practice referred by Dorothyann Peng, NP for our initial evaluation of his chronic pain. He has Chronic diarrhea; Bipolar disorder ([place]); Smoker; Chronic narcotic use; Opioid contract exists; Thoracic outlet syndrome; Adhesive capsulitis of right shoulder; Essential hypertension; GERD (gastroesophageal reflux disease); Internal hemorrhoid; Abdominal pain, chronic, epigastric; Rectal bleeding; Auricular cyst; Coccygeal pain, acute; Laryngopharyngeal reflux (LPR); Chronic pain syndrome; Chronic foot pain (1ry area of Pain) (Left); Pharmacologic therapy; Disorder of skeletal system; Problems influencing health status; Reflex sympathetic dystrophy of lower extremity (Left); Complex regional pain syndrome I of lower limb (Left); Avascular necrosis of hip (Right) ([place]); Chronic hip pain (Right); Chronic low back pain (2ry area of Pain) (Bilateral) w/o sciatica; and Chronic shoulder pain (3ry area of Pain) (Right) on their problem list. Today he comes in for evaluation of his Foot Pain (left), Hip Pain (right), and Back Pain (low)  Pain Assessment: Location: Left Foot Radiating: raidates up to lower back Onset: More than a month ago Duration: Chronic pain Quality: Aching,Dull,Burning Severity: 5 /10  (subjective, self-reported pain score)  Effect on ADL: "I cant do alot of notmal activities" Timing: Constant Modifying factors: injection, meds, rest, heat, elevation BP: (!) 128/99  HR: (!) 112  Onset and Duration: Sudden and Present longer than 3 months17 yrs ago Cause of pain: Work related accident or event Severity: Getting better, Getting worse, No change since onset, NAS-11 at its worse: 9/10, NAS-11 at its best: 4/10, NAS-11 now: 6/10 and NAS-11 on the average: 6/10 Timing: Morning, Afternoon, Night, Not influenced by the time of the day, During activity or exercise, After activity or exercise and After a period of immobility Aggravating Factors: Bending, Climbing, Intercourse (sex), Kneeling, Lifiting, Motion, Prolonged sitting, Prolonged standing, Squatting, Twisting, Walking, Walking uphill, Walking downhill and Working Alleviating Factors: Bending, Stretching, Cold packs, Hot packs, Lying down, Medications, Nerve blocks, Resting, Sitting, Sleeping, Standing, TENS, Using a brace, Warm showers or baths and Walking Associated Problems: Color changes, Day-time cramps, Night-time cramps, Depression, Erectile dysfunction, Fatigue, Inability to concentrate, Nausea, Numbness, Sadness, Spasms, Sweating, Swelling, Temperature changes, Tingling, Weakness, Pain that wakes patient up and Pain that does not allow patient to sleep Quality of Pain: Aching, Agonizing, Annoying, Burning, Intermittent, Deep, Disabling, Dull, Exhausting, Feeling of weight, Getting longer, Getting shorter, Horrible, Hot, Itching, Nagging, Pressure-like, Pulsating, Sharp, Shooting, Sickening, Stabbing, Tender, Throbbing, Tingling, Toothache-like and Uncomfortable Previous Examinations or Tests: CT scan, MRI scan, Nerve block, X-rays, Nerve conduction test, Neurological evaluation, Orthopedic evaluation and Psychiatric evaluation Previous Treatments: Epidural steroid injections, Narcotic medications, Physical Therapy, Pool  exercises, Steroid treatments by mouth and TENS  Patient apparently referred to [facility] pain management clinic (Dr. Nicholaus Bloom) for chronic pain syndrome s/p crush injury of left foot (05/30/2020).  Eventually the patient was referred to our offices around  06/22/2020 secondary to the fact that the other practice was apparently out of network for his insurance.  According to the patient the problem started approximately 15 years ago around 2005 when he had a marked top made out of [place] fall on his left foot causing a crush injury that later developed into a complex regional pain syndrome.  Over the years he has been treated by different physicians such as Dr. Wylene Men, Dr. Berle Mull, and Forde Radon. Run[place], MD.   According to the patient the primary area of pain is that of the left foot.  He denies having had any surgeries in the foot but he did have treatment with a TENS unit, as well as lumbar sympathetic blocks.  The patient secondary area pain is that of the lower back (Bilateral) with the right being just as bad as the left.  He indicates that the pain in the lower back worsens with weight lifting, bending, and he has had some epidural steroid injections done by Dr. Trula Ore.  He denies any back surgeries or recent x-rays.  He also denies any physical therapy for the back pain.  The patient's third area pain is that of the right shoulder.  He denies any prior surgeries, physical therapy, recent x-rays, but does admit to having had some injections done by his primary care physician which apparently did provide him with some relief of the pain.  More recently he was diagnosed with a right hip avascular necrosis and he is pending to see Dr. Sherrian Divers.  Today I took the time to provide the patient with information regarding my pain practice. The patient was informed that my practice is divided into two sections: an interventional pain management section, as well as a completely separate and distinct  medication management section. I explained that I have procedure days for my interventional therapies, and evaluation days for follow-ups and medication management. Because of the amount of documentation required during both, they are kept separated. This means that there is the possibility that he may be scheduled for a procedure on one day, and medication management the next. I have also informed him that because of staffing and facility limitations, I no longer take patients for medication management only. To illustrate the reasons for this, I gave the patient the example of surgeons, and how inappropriate it would be to refer a patient to his/her care, just to write for the post-surgical antibiotics on a surgery done by a different surgeon.   Because interventional pain management is my board-certified specialty, the patient was informed that joining my practice means that they are open to any and all interventional therapies. I made it clear that this does not mean that they will be forced to have any procedures done. What this means is that I believe interventional therapies to be essential part of the diagnosis and proper management of chronic pain conditions. Therefore, patients not interested in these interventional alternatives will be better served under the care of a different practitioner.  The patient was also made aware of my Comprehensive Pain Management Safety Guidelines where by joining my practice, they limit all of their nerve blocks and joint injections to those done by our practice, for as long as we are retained to manage their care.   Historic Controlled Substance Pharmacotherapy Review  PMP and historical list of controlled substances: Oxycodone IR 5 mg tablet, 1 tab p.o. 4 times daily (# 20) (07/25/2020); oxycodone/APAP 7.5/325, 1 tab p.o. 3 times daily; oxycodone/APAP 5/325  1 tab p.o. twice daily; clonazepam 1 mg p.o. daily; tramadol 50 mg 1 tab p.o. 3 times daily; oxycodone IR 10  mg 1 tab p.o. 3 times daily. Current opioid analgesics: Oxycodone IR 5 mg, 1 tab p.o. 4 times daily (#20) (07/25/2020) MME/day: 30 mg/day  Historical Monitoring: The patient  reports no history of drug use. List of all UDS Test(s): Lab Results  Component Value Date   MDMA NEG 01/08/2015   MDMA NEG 09/07/2014   MDMA NEG 05/26/2014   COCAINSCRNUR NONE DETECTED 11/26/2016   COCAINSCRNUR NEG 01/08/2015   COCAINSCRNUR NEG 10/25/2014   COCAINSCRNUR NEG 09/07/2014   COCAINSCRNUR NEG 05/26/2014   THCU NONE DETECTED 11/26/2016   THCU NEG 01/08/2015   THCU NEG 10/25/2014   THCU NEG 09/07/2014   THCU NEG 05/26/2014   List of other Serum/Urine Drug Screening Test(s):  Lab Results  Component Value Date   COCAINSCRNUR NONE DETECTED 11/26/2016   COCAINSCRNUR NEG 01/08/2015   COCAINSCRNUR NEG 10/25/2014   COCAINSCRNUR NEG 09/07/2014   COCAINSCRNUR NEG 05/26/2014   THCU NONE DETECTED 11/26/2016   THCU NEG 01/08/2015   THCU NEG 10/25/2014   THCU NEG 09/07/2014   THCU NEG 05/26/2014   Historical Background Evaluation: [place] PMP: PDMP reviewed during this encounter. Online review of the past 30-monthperiod conducted.             PMP NARX Score Report:  Narcotic: 370 Sedative: 220 Stimulant: 000 [place] Department of public safety, offender search: (Editor, commissioningInformation) Non-contributory Risk Assessment Profile: Aberrant behavior: None observed or detected today Risk factors for fatal opioid overdose: None identified today PMP NARX Overdose Risk Score: 190 Fatal overdose hazard ratio (HR): Calculation deferred Non-fatal overdose hazard ratio (HR): Calculation deferred Risk of opioid abuse or dependence: 0.7-3.0% with doses ? 36 MME/day and 6.1-26% with doses ? 120 MME/day. Substance use disorder (SUD) risk level: See below Personal History of Substance Abuse (SUD-Substance use disorder):  Alcohol: Negative  Illegal Drugs: Negative  Rx Drugs: Negative  ORT Risk Level calculation: Low Risk   Opioid Risk Tool - 07/30/20 1320      Family History of Substance Abuse   Alcohol Negative    Illegal Drugs Negative    Rx Drugs Negative      Personal History of Substance Abuse   Alcohol Negative    Illegal Drugs Negative    Rx Drugs Negative      Age   Age between 145-45years  No      History of Preadolescent Sexual Abuse   History of Preadolescent Sexual Abuse Negative or Male      Psychological Disease   Depression Positive      Total Score   Opioid Risk Tool Scoring 1    Opioid Risk Interpretation Low Risk          ORT Scoring interpretation table:  Score <3 = Low Risk for SUD  Score between 4-7 = Moderate Risk for SUD  Score >8 = High Risk for Opioid Abuse   PHQ-2 Depression Scale:  Total score: 0  PHQ-2 Scoring interpretation table: (Score and probability of major depressive disorder)  Score 0 = No depression  Score 1 = 15.4% Probability  Score 2 = 21.1% Probability  Score 3 = 38.4% Probability  Score 4 = 45.5% Probability  Score 5 = 56.4% Probability  Score 6 = 78.6% Probability   PHQ-9 Depression Scale:  Total score: 0  PHQ-9 Scoring interpretation table:  Score 0-4 = No depression  Score 5-9 = Mild depression  Score 10-14 = Moderate depression  Score 15-19 = Moderately severe depression  Score 20-27 = Severe depression (2.4 times higher risk of SUD and 2.89 times higher risk of overuse)   Pharmacologic Plan: As per protocol, I have not taken over any controlled substance management, pending the results of ordered tests and/or consults.            Initial impression: Pending review of available data and ordered tests.  Meds   Current Outpatient Medications:  .  famotidine (PEPCID) 20 MG tablet, Take 20 mg by mouth 2 (two) times daily., Disp: , Rfl:  .  gabapentin (NEURONTIN) 300 MG capsule, Take 1 capsule (300 mg total) by mouth 3 (three) times daily. (Patient taking differently: Take 600 mg by mouth 3 (three) times daily.), Disp: 270 capsule,  Rfl: 3 .  Lidocaine (HM LIDOCAINE PATCH) 4 % PTCH, Apply topically., Disp: , Rfl:  .  lidocaine (LIDODERM) 5 %, Place 1 patch onto the skin daily. Remove & Discard patch within 12 hours or as directed by MD, Disp: , Rfl:  .  lidocaine (LINDAMANTLE) 3 % CREA cream, Apply 1 application topically daily as needed (chronic pain)., Disp: 28.35 g, Rfl: 1 .  lidocaine (XYLOCAINE) 5 % ointment, Apply 1 application topically as needed., Disp: , Rfl:  .  Multiple Vitamin (MULTIVITAMIN WITH MINERALS) TABS tablet, Take 1 tablet by mouth daily., Disp: , Rfl:  .  Nerve Stimulator (TENS THERAPY PAIN RELIEF) DEVI, 1 application by Does not apply route daily as needed (chronic pain)., Disp: , Rfl:  .  nortriptyline (PAMELOR) 75 MG capsule, Take 1 capsule (75 mg total) by mouth at bedtime., Disp: 90 capsule, Rfl: 1 .  ondansetron (ZOFRAN ODT) 4 MG disintegrating tablet, Dissolve 1 tablet (4 mg total) by mouth every 8 (eight) hours as needed for nausea or vomiting., Disp: 20 tablet, Rfl: 2 .  pantoprazole (PROTONIX) 40 MG tablet, Take 1 tablet (40 mg total) by mouth daily., Disp: 30 tablet, Rfl: 0 .  sildenafil (REVATIO) 20 MG tablet, TAKE 1 TABLET BY MOUTH 2 TIMES DAILY, Disp: 180 tablet, Rfl: 0 .  tiZANidine (ZANAFLEX) 4 MG tablet, Take 1 tablet by mouth 3 (three) times daily as needed., Disp: , Rfl: 6  Imaging Review  Shoulder Imaging: Shoulder-R DG: Results for orders placed in visit on 03/12/15 DG Shoulder Right  Narrative CLINICAL DATA:  Chronic pain.  No reported injury.  EXAM: RIGHT SHOULDER - 2+ VIEW  COMPARISON:  None.  FINDINGS: No acute bony or joint abnormality identified. No evidence of fracture dislocation.  IMPRESSION: Negative.  Electronically Signed By: Marcello Moores  Register On: 03/12/2015 10:31  Shoulder-L DG: Results for orders placed during the hospital encounter of 01/04/11 DG Shoulder Left  Narrative *RADIOLOGY REPORT*  Clinical Data: Pain  LEFT SHOULDER - 2+  VIEW  Comparison: None  Findings: There is no acute fracture or subluxation identified.  There is no significant arthropathy.  Sclerotic density within the inferior glenoid, likely bone island.  IMPRESSION:  1.  Negative exam.  Original Report Authenticated By: Angelita Ingles, M.D.  Lumbosacral Imaging: Lumbar DG (Complete) 4+V: Results for orders placed during the hospital encounter of 05/20/12 DG Lumbar Spine Complete  Narrative *RADIOLOGY REPORT*  Clinical Data: Fall with low back pain.  LUMBAR SPINE - COMPLETE 4+ VIEW  Comparison: None  Findings: Five non-rib bearing lumbar type vertebra are identified with a very mild mild apex left lumbar scoliosis. There is no  evidence of fracture or subluxation. The disc spaces are maintained. No focal bony lesions or spondylolysis identified.  IMPRESSION: Very mild scoliosis without other significant abnormality.  Original Report Authenticated By: Margarette Canada, M.D.        Ankle Imaging: Ankle-R DG Complete: Results for orders placed in visit on 12/05/14 DG Ankle Complete Right  Narrative CLINICAL DATA:  Twisting injury with swelling and pain. Initial encounter.  EXAM: RIGHT ANKLE - COMPLETE 3+ VIEW  COMPARISON:  Foot films, dictated separately.  FINDINGS: First image is suboptimal secondary to patient positioning. Mortise and lateral views demonstrate no fracture or dislocation. Tiny Achilles and calcaneal spurs. Base of fifth metatarsal and talar dome intact. No definite soft tissue swelling.  IMPRESSION: No acute osseous abnormality.  Electronically Signed By: Abigail Miyamoto M.D. On: 12/05/2014 20:31  Ankle-L DG Complete: Results for orders placed during the hospital encounter of 01/29/08 DG Ankle Complete Left  Narrative Clinical Data: Trauma with swelling anteriorly and medially  LEFT ANKLE COMPLETE - 3+ VIEW  Comparison: None  Findings: No evidence of fracture, dislocation or joint  effusion. Anterior soft tissue swelling.]  IMPRESSION: No fracture or dislocation  Provider: Dyke Brackett  Foot Imaging: Foot-R DG Complete: Results for orders placed in visit on 12/05/14 DG Foot Complete Right  Narrative CLINICAL DATA:  Twisting injury with pain. Initial encounter.  EXAM: RIGHT FOOT COMPLETE - 3+ VIEW  COMPARISON:  Ankle films, dictated separately.  FINDINGS: No acute fracture or dislocation.  No definite soft tissue swelling.  IMPRESSION: No acute osseous abnormality.   Electronically Signed By: Abigail Miyamoto M.D. On: 12/05/2014 20:32  Foot-L DG Complete: Results for orders placed in visit on 06/16/13 DG Foot Complete Left  Narrative CLINICAL DATA:  Left foot injury.  EXAM: LEFT FOOT - COMPLETE 3+ VIEW  COMPARISON:  November 19, 2010.  FINDINGS: There is again noted an old fracture involving the proximal base of the second proximal phalanx which is unchanged compared to prior exam. No acute fracture or dislocation is noted. Joint spaces are intact. No soft tissue abnormality is noted.  IMPRESSION: Stable old ununited fracture of proximal base of second proximal phalanx. No acute fracture or dislocation is noted.   Electronically Signed By: Sabino Dick M.D. On: 06/16/2013 16:49  Hand Imaging: Hand-L DG Complete: Results for orders placed in visit on 08/12/18 DG Hand Complete Left  Narrative CLINICAL DATA:  Status post fall hit left hand with left hand pain.  EXAM: LEFT HAND - COMPLETE 3+ VIEW  COMPARISON:  None.  FINDINGS: There is no evidence of fracture or dislocation. There is no evidence of arthropathy or other focal bone abnormality. Soft tissues are unremarkable.  IMPRESSION: Negative.  Electronically Signed By: Abelardo Diesel M.D. On: 08/12/2018 09:16  Complexity Note: Imaging results reviewed. Results shared with [name], using Layman's terms.              ROS  Cardiovascular: High blood  pressure Pulmonary or Respiratory: Smoking, Snoring  and Coughing up mucus (Bronchitis) Neurological: No reported neurological signs or symptoms such as seizures, abnormal skin sensations, urinary and/or fecal incontinence, being born with an abnormal open spine and/or a tethered spinal cord Psychological-Psychiatric: Depressed and Prone to panicking Gastrointestinal: Heartburn due to stomach pushing into lungs (Hiatal hernia), Reflux or heatburn and Irregular, infrequent bowel movements (Constipation) Genitourinary: No reported renal or genitourinary signs or symptoms such as difficulty voiding or producing urine, peeing blood, non-functioning kidney, kidney stones, difficulty emptying the bladder, difficulty  controlling the flow of urine, or chronic kidney disease Hematological: No reported hematological signs or symptoms such as prolonged bleeding, low or poor functioning platelets, bruising or bleeding easily, hereditary bleeding problems, low energy levels due to low hemoglobin or being anemic Endocrine: No reported endocrine signs or symptoms such as high or low blood sugar, rapid heart rate due to high thyroid levels, obesity or weight gain due to slow thyroid or thyroid disease Rheumatologic: No reported rheumatological signs and symptoms such as fatigue, joint pain, tenderness, swelling, redness, heat, stiffness, decreased range of motion, with or without associated rash Musculoskeletal: Negative for myasthenia gravis, muscular dystrophy, multiple sclerosis or malignant hyperthermia Work History: Working full time  Allergies  [name] is allergic to aspirin, codeine, penicillins, tramadol, and amitriptyline.  Laboratory Chemistry Profile   Renal Lab Results  Component Value Date   BUN 14 07/24/2020   CREATININE 0.93 07/24/2020   LABCREA 128.21 01/08/2015   GFR 107.30 07/03/2020   GFRAA >60 07/01/2017   GFRNONAA >60 07/24/2020   SPECGRAV >=1.030 04/02/2016   PHUR 6.0  04/02/2016   PROTEINUR NEGATIVE 07/24/2020     Electrolytes Lab Results  Component Value Date   NA 140 07/24/2020   K 3.8 07/24/2020   CL 99 07/24/2020   CALCIUM 10.2 07/24/2020     Hepatic Lab Results  Component Value Date   AST 35 07/24/2020   ALT 36 07/24/2020   ALBUMIN 5.4 (H) 07/24/2020   ALKPHOS 107 07/24/2020   AMYLASE 34 08/24/2018   LIPASE 16.0 08/24/2018     ID Lab Results  Component Value Date   HIV NON-REACTIVE 03/29/2020   RMSFIGG NOT DETECTED 03/29/2020     Bone Lab Results  Component Value Date   VD25OH 18.46 (L) 12/18/2017     Endocrine Lab Results  Component Value Date   GLUCOSE 72 07/24/2020   GLUCOSEU NEGATIVE 07/24/2020   HGBA1C 5.8 10/25/2014   TSH 3.30 03/29/2020   FREET4 1.06 12/05/2014     Neuropathy Lab Results  Component Value Date   HGBA1C 5.8 10/25/2014   HIV NON-REACTIVE 03/29/2020     CNS No results found for: COLORCSF, APPEARCSF, RBCCOUNTCSF, WBCCSF, POLYSCSF, LYMPHSCSF, EOSCSF, PROTEINCSF, GLUCCSF, JCVIRUS, CSFOLI, IGGCSF, LABACHR, ACETBL, LABACHR, ACETBL   Inflammation (CRP: Acute  ESR: Chronic) Lab Results  Component Value Date   CRP <1.0 03/29/2020   ESRSEDRATE 18 (H) 03/29/2020   LATICACIDVEN 2.11 01/03/2014     Rheumatology Lab Results  Component Value Date   ANA NEGATIVE 03/29/2020     Coagulation Lab Results  Component Value Date   PLT 250 07/24/2020   DDIMER <0.27 02/08/2013     Cardiovascular Lab Results  Component Value Date   HGB 16.9 07/24/2020   HCT 50.4 07/24/2020     Screening Lab Results  Component Value Date   HIV NON-REACTIVE 03/29/2020     Cancer No results found for: CEA, CA125, LABCA2   Allergens No results found for: ALMOND, APPLE, ASPARAGUS, AVOCADO, BANANA, BARLEY, BASIL, BAYLEAF, GREENBEAN, LIMABEAN, WHITEBEAN, BEEFIGE, REDBEET, BLUEBERRY, BROCCOLI, CABBAGE, MELON, CARROT, CASEIN, CASHEWNUT, CAULIFLOWER, CELERY     Note: Lab results reviewed.  PFSH  Drug: Mr.  Shane Cole  reports no history of drug use. Alcohol:  reports current alcohol use of about 6.0 standard drinks of alcohol per week. Tobacco:  reports that he has been smoking cigarettes and e-cigarettes. He has a 11.50 pack-year smoking history. He has never used smokeless tobacco. Medical:  has a past medical history of Allergy,  Anxiety, Bipolar disorder ([place]), Bronchitis, Crush injury lower leg, Depression, Gastric ulcer, GERD (gastroesophageal reflux disease), Hypertension, Migraine, Nerve pain, and RSD (reflex sympathetic dystrophy). Family: family history includes Anxiety disorder in his father and paternal aunt; Cancer in his maternal grandfather, maternal grandmother, paternal grandfather, and paternal grandmother; Colon cancer in his paternal grandfather and paternal uncle; Depression in his paternal aunt; Diabetes in his paternal grandmother; Drug abuse in his father and paternal uncle; Heart disease in an other family member; Hyperlipidemia in his father; Hypertension in his father; Lung cancer in his maternal grandfather and paternal grandfather; Stroke in an other family member.  Past Surgical History:  Procedure Laterality Date  . CHOLECYSTECTOMY N/A 01/24/2014   Procedure: LAPAROSCOPIC CHOLECYSTECTOMY;  Surgeon: Ralene Ok, MD;  Location: [facility];  Service: General;  Laterality: N/A;  . COLONOSCOPY    . HAND SURGERY    . MASS EXCISION Left 01/25/2013   Procedure: EXCISION MASS DORSAL ASPECT LEFT LONG FINGER DISTAL INTERPHALANGEAL JOINT;  Surgeon: Cammie Sickle., MD;  Location: [facility];  Service: Orthopedics;  Laterality: Left;  Left long   . MOUTH SURGERY     Active Ambulatory Problems    Diagnosis Date Noted  . Chronic diarrhea 03/29/2012  . Bipolar disorder ([place]) 03/29/2012  . Smoker 05/11/2014  . Chronic narcotic use 10/26/2014  . Opioid contract exists 10/26/2014  . Thoracic outlet syndrome 02/12/2015  . Adhesive capsulitis of right shoulder  07/16/2015  . Essential hypertension 02/26/2016  . GERD (gastroesophageal reflux disease) 07/16/2016  . Internal hemorrhoid 07/16/2016  . Abdominal pain, chronic, epigastric 07/16/2016  . Rectal bleeding 07/16/2016  . 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 Coccygeal pain, acute 05/18/2012  . Laryngopharyngeal reflux (LPR) 01/21/2018  . Chronic pain syndrome 05/18/2012  . Chronic foot pain (1ry area of Pain) (Left) 06/11/2011  . Pharmacologic therapy 07/30/2020  . Disorder of skeletal system 07/30/2020  . Problems influencing health status 07/30/2020  . Reflex sympathetic dystrophy of lower extremity (Left) 07/30/2020  . Complex regional pain syndrome I of lower limb (Left) 07/30/2020  . Avascular necrosis of hip (Right) ([place]) 07/30/2020  . Chronic hip pain (Right) 07/30/2020  . Chronic low back pain (2ry area of Pain) (Bilateral) w/o sciatica 07/30/2020  . Chronic shoulder pain (3ry area of Pain) (Right) 07/30/2020   Resolved Ambulatory Problems    Diagnosis Date Noted  . Cellulitis of buttock, left 09/30/2014   Past Medical History:  Diagnosis Date  . Allergy   . Anxiety   . Bronchitis   . Crush injury lower leg   . Depression   . Gastric ulcer   . Hypertension   . Migraine   . Nerve pain   . RSD (reflex sympathetic dystrophy)    Constitutional Exam  General appearance: Well nourished, well developed, and well hydrated. In no apparent acute distress Vitals:   07/30/20 1308  BP: (!) 128/99  Pulse: (!) 112  Resp: 18  Temp: (!) 97.3 F (36.3 C)  SpO2: 100%  Weight: 204 lb (92.5 kg)  Height: 5' 11"  (1.803 m)   BMI Assessment: Estimated body mass index is 28.45 kg/m as calculated from the following:   Height as of this encounter: 5' 11"  (1.803 m).   Weight as of this encounter: 204 lb (92.5 kg).  BMI interpretation table: BMI level Category Range association with higher incidence of chronic pain  <18 kg/m2 Underweight   18.5-24.9 kg/m2 Ideal body weight   25-29.9 kg/m2  Overweight Increased incidence by  20%  30-34.9 kg/m2 Obese (Class I) Increased incidence by 68%  35-39.9 kg/m2 Severe obesity (Class II) Increased incidence by 136%  >40 kg/m2 Extreme obesity (Class III) Increased incidence by 254%   Patient's current BMI Ideal Body weight  Body mass index is 28.45 kg/m. Ideal body weight: 75.3 kg (166 lb 0.1 oz) Adjusted ideal body weight: 82.2 kg (181 lb 3.3 oz)   BMI Readings from Last 4 Encounters:  07/30/20 28.45 kg/m  07/26/20 29.27 kg/m  07/24/20 29.56 kg/m  07/24/20 29.84 kg/m   Wt Readings from Last 4 Encounters:  07/30/20 204 lb (92.5 kg)  07/26/20 204 lb (92.5 kg)  07/24/20 206 lb (93.4 kg)  07/24/20 205 lb (93 kg)    Psych/Mental status: Alert, oriented x 3 (person, place, & time)       Eyes: PERLA Respiratory: No evidence of acute respiratory distress  Assessment  Primary Diagnosis & Pertinent Problem List: The primary encounter diagnosis was Chronic pain syndrome. Diagnoses of Pharmacologic therapy, Disorder of skeletal system, Problems influencing health status, Chronic foot pain (1ry area of Pain) (Left), Reflex sympathetic dystrophy of lower extremity (Left), Complex regional pain syndrome I of lower limb (Left), Avascular necrosis of hip (Right) (HCC), Chronic hip pain (Right), Chronic low back pain (2ry area of Pain) (Bilateral) w/o sciatica, and Chronic shoulder pain (3ry area of Pain) (Right) were also pertinent to this visit.  Visit Diagnosis (New problems to examiner): 1. Chronic pain syndrome   2. Pharmacologic therapy   3. Disorder of skeletal system   4. Problems influencing health status   5. Chronic foot pain (1ry area of Pain) (Left)   6. Reflex sympathetic dystrophy of lower extremity (Left)   7. Complex regional pain syndrome I of lower limb (Left)   8. Avascular necrosis of hip (Right) (HCC)   9. Chronic hip pain (Right)   10. Chronic low back pain (2ry area of Pain) (Bilateral) w/o sciatica   11. Chronic  shoulder pain (3ry area of Pain) (Right)    Plan of Care (Initial workup plan)  Note: [name] was reminded that as per protocol, today's visit has been an evaluation only. We have not taken over the patient's controlled substance management.  Problem-specific plan: No problem-specific Assessment & Plan notes found for this encounter.   Lab Orders     Compliance Drug Analysis, Ur     Comp. Metabolic Panel (12)     Magnesium     Vitamin B12     Sedimentation rate     25-Hydroxy vitamin D Lcms D2+D3     C-reactive protein  Imaging Orders     NM Bone Scan 3 Phase Lower Extremity     DG Foot Complete Left     DG Lumbar Spine Complete W/Bend     DG Shoulder Right     DG HIP UNILAT W OR W/O PELVIS 2-3 VIEWS RIGHT Referral Orders  No referral(s) requested today   Procedure Orders    No procedure(s) ordered today   Pharmacotherapy (current): Medications ordered:  No orders of the defined types were placed in this encounter.  Medications administered during this visit: Malachi Kinzler. Walker Shadow. had no medications administered during this visit.   Pharmacological management options:  Opioid Analgesics: The patient was informed that there is no guarantee that he would be a candidate for opioid analgesics. The decision will be made following CDC guidelines. This decision will be based on the results of diagnostic studies, as well as Mr.  Raisanen's risk profile.   Membrane stabilizer: To be determined at a later time  Muscle relaxant: To be determined at a later time  NSAID: To be determined at a later time  Other analgesic(s): To be determined at a later time   Interventional management options: [name] was informed that there is no guarantee that he would be a candidate for interventional therapies. The decision will be based on the results of diagnostic studies, as well as Mr. Reino Kent risk profile.  Procedure(s) under consideration:  Diagnostic/therapeutic left  lumbar sympathetic blocks  Diagnostic bilateral lumbar facet block  Diagnostic right IA shoulder joint injection    Provider-requested follow-up: Return for (F2F), (40 min) 2V on evaluation day, (s/p Tests).  Future Appointments  Date Time Provider [place]  08/07/2020  3:45 PM Leandrew Koyanagi, MD [facility] None  09/17/2020 11:00 AM Milinda Pointer, MD [facility] None    Note by: Gaspar Cola, MD Date: 07/30/2020; Time: 5:41 PM

## 2020-07-30 ENCOUNTER — Ambulatory Visit
Admission: RE | Admit: 2020-07-30 | Discharge: 2020-07-30 | Disposition: A | Discharge status: Home / Self Care | Payer: No Typology Code available for payment source | Source: Ambulatory Visit | Attending: Pain Medicine | Admitting: Pain Medicine

## 2020-07-30 ENCOUNTER — Encounter: Payer: Self-pay | Admitting: Pain Medicine

## 2020-07-30 ENCOUNTER — Other Ambulatory Visit: Payer: Self-pay

## 2020-07-30 ENCOUNTER — Ambulatory Visit (HOSPITAL_BASED_OUTPATIENT_CLINIC_OR_DEPARTMENT_OTHER): Payer: No Typology Code available for payment source | Admitting: Pain Medicine

## 2020-07-30 ENCOUNTER — Ambulatory Visit
Admission: RE | Admit: 2020-07-30 | Discharge: 2020-07-30 | Disposition: A | Discharge status: Home / Self Care | Payer: No Typology Code available for payment source | Attending: Pain Medicine | Admitting: Pain Medicine

## 2020-07-30 VITALS — BP 128/99 | HR 112 | Temp 97.3°F | Resp 18 | Ht 71.0 in | Wt 204.0 lb

## 2020-07-30 DIAGNOSIS — G90522 Complex regional pain syndrome I of left lower limb: Secondary | ICD-10-CM | POA: Insufficient documentation

## 2020-07-30 DIAGNOSIS — G894 Chronic pain syndrome: Secondary | ICD-10-CM

## 2020-07-30 DIAGNOSIS — Z789 Other specified health status: Secondary | ICD-10-CM

## 2020-07-30 DIAGNOSIS — Z79899 Other long term (current) drug therapy: Secondary | ICD-10-CM | POA: Insufficient documentation

## 2020-07-30 DIAGNOSIS — G8929 Other chronic pain: Secondary | ICD-10-CM

## 2020-07-30 DIAGNOSIS — M87051 Idiopathic aseptic necrosis of right femur: Secondary | ICD-10-CM

## 2020-07-30 DIAGNOSIS — M545 Low back pain, unspecified: Secondary | ICD-10-CM | POA: Insufficient documentation

## 2020-07-30 DIAGNOSIS — M25511 Pain in right shoulder: Secondary | ICD-10-CM

## 2020-07-30 DIAGNOSIS — M25551 Pain in right hip: Secondary | ICD-10-CM

## 2020-07-30 DIAGNOSIS — M899 Disorder of bone, unspecified: Secondary | ICD-10-CM | POA: Insufficient documentation

## 2020-07-30 DIAGNOSIS — M79672 Pain in left foot: Secondary | ICD-10-CM | POA: Insufficient documentation

## 2020-07-30 IMAGING — CR DG FOOT COMPLETE 3+V*L*
1 series · 3 of 3 positions shown · non-contrast
Comparison: Plain films left foot [DATE].

CLINICAL DATA: Left foot pain since a crush injury in [HR].

EXAM:
LEFT FOOT - COMPLETE 3+ VIEW

[Series 1: dg foot complete left · 0.14mm/px · 3 of 3 slices shown]
[im 1/3]
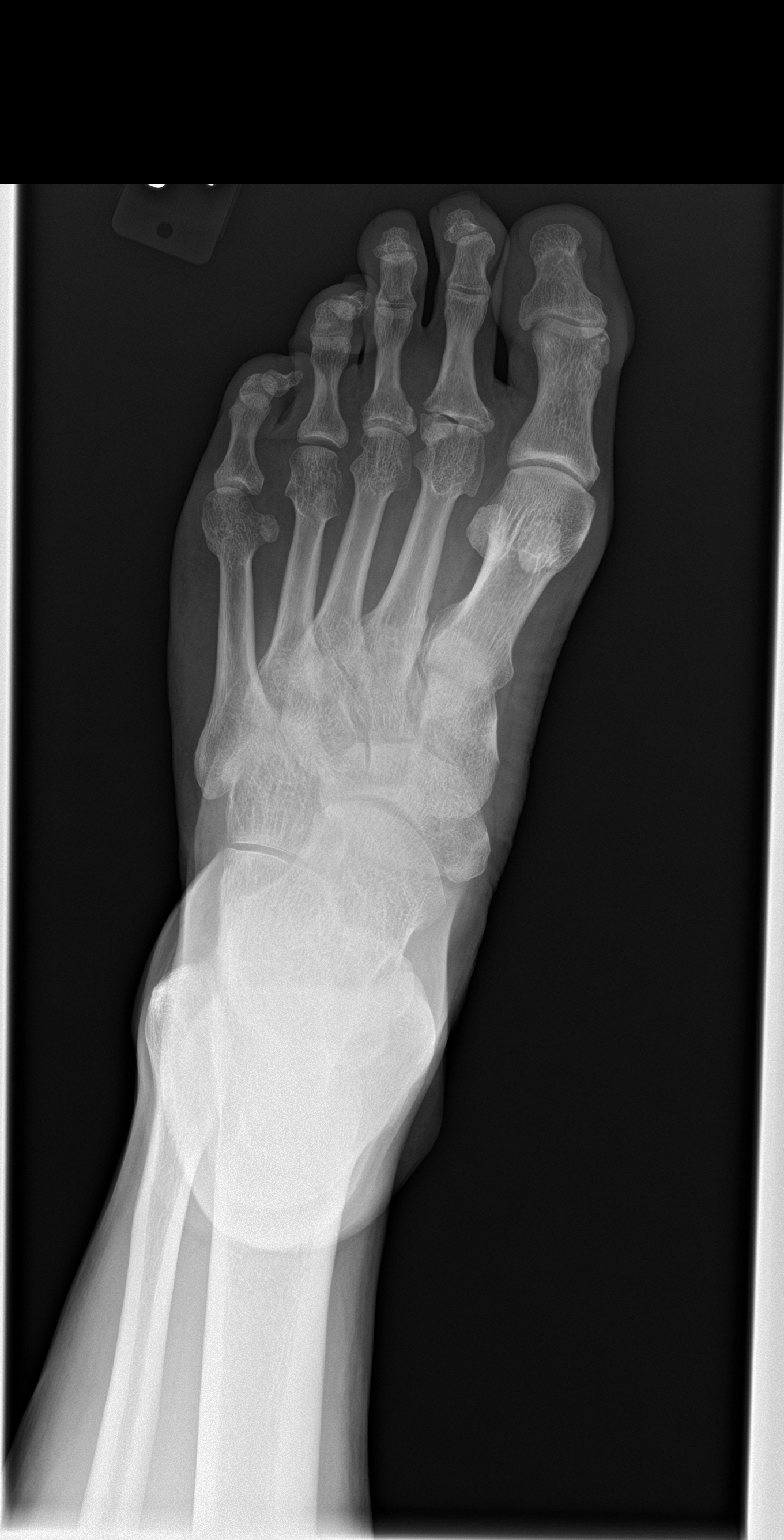
[im 2/3]
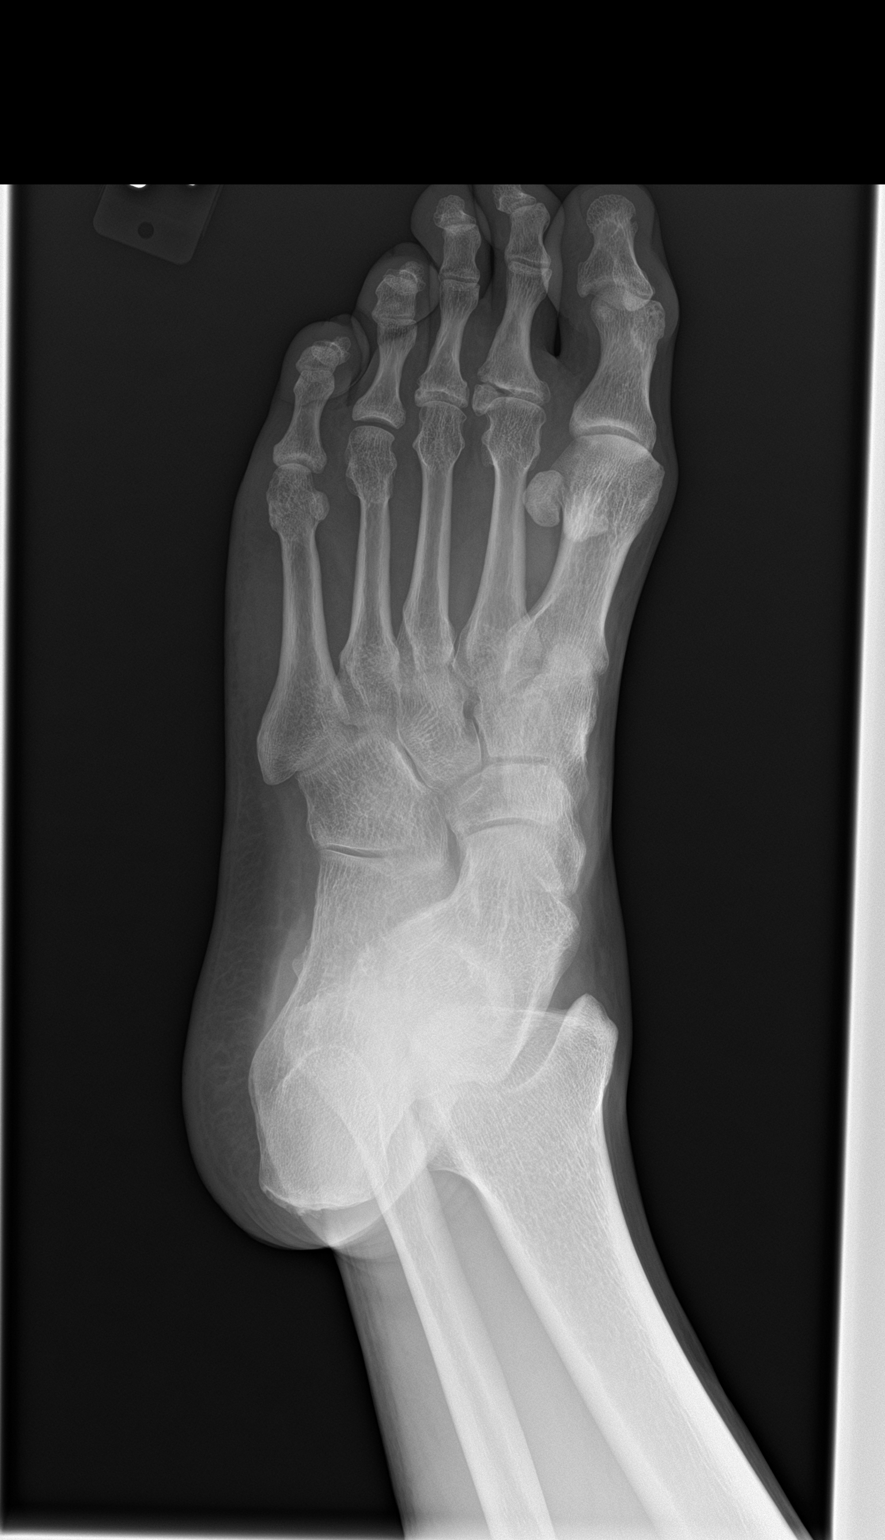
[im 3/3]
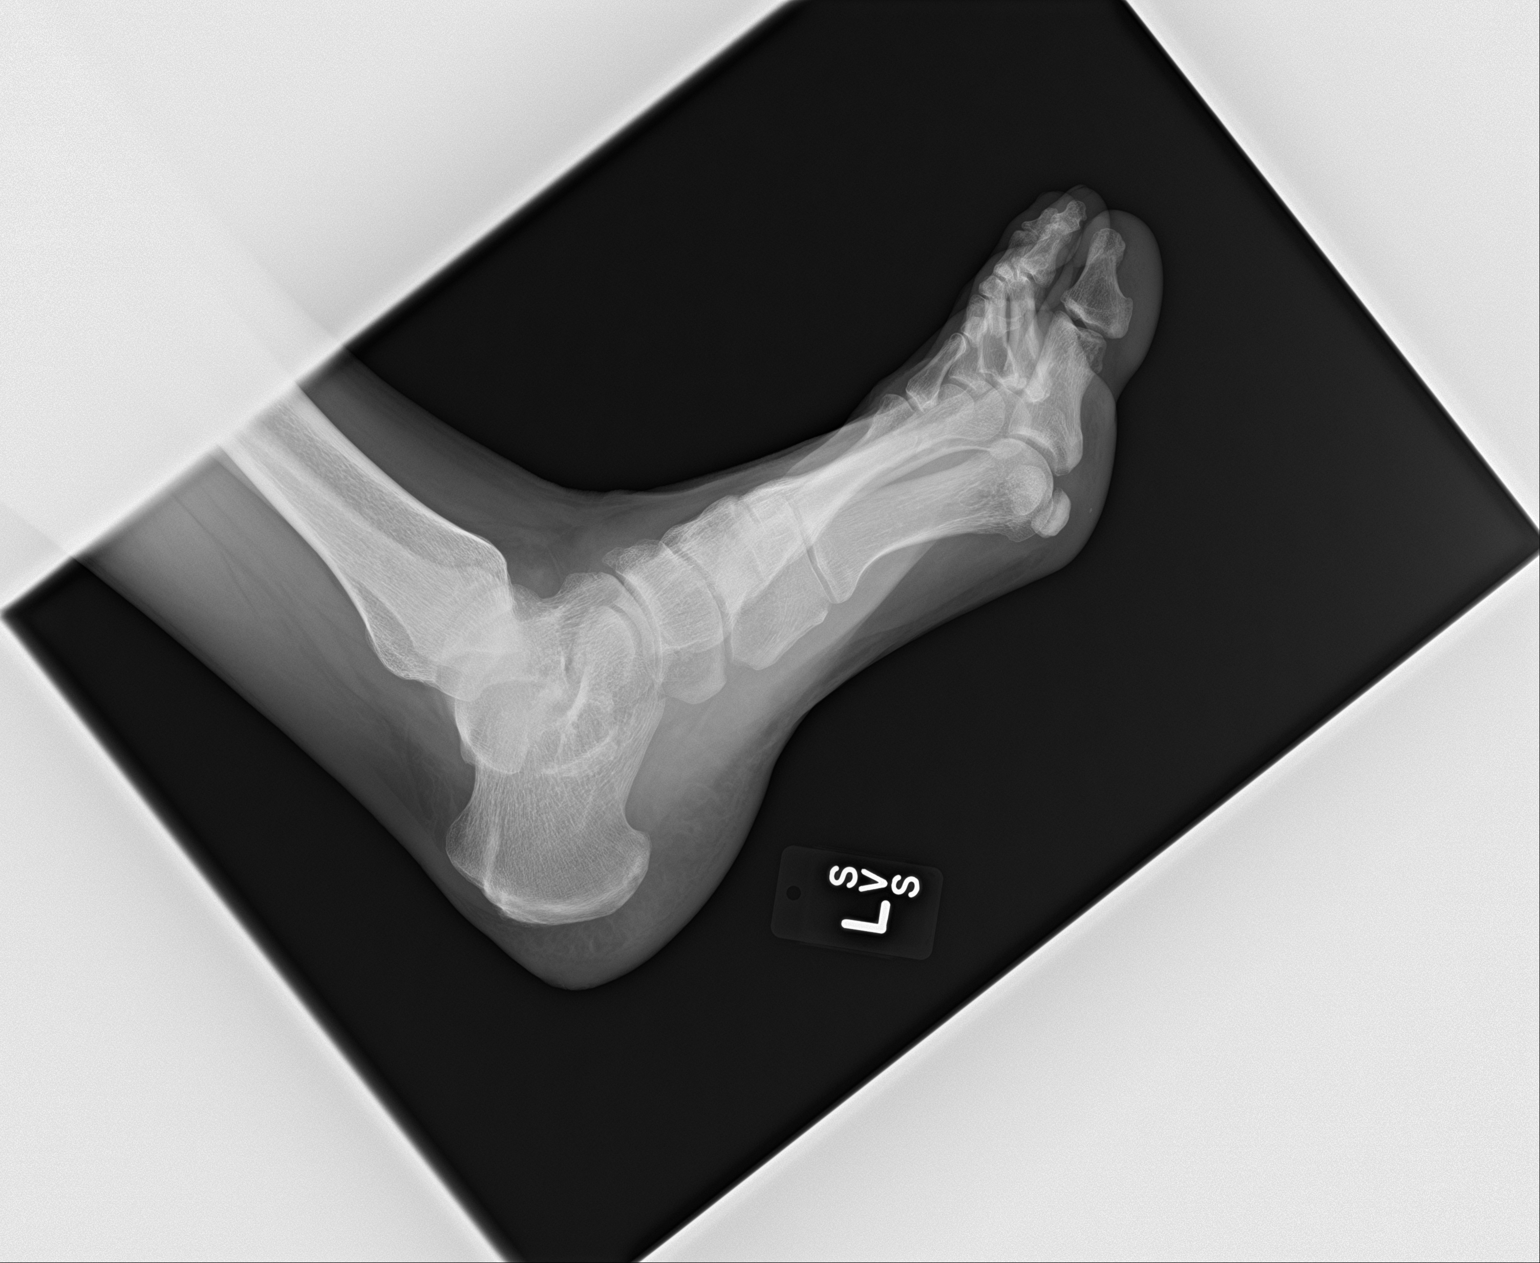

[3 of 3 positions shown; findings below may reference images not displayed]

FINDINGS: Again seen is nonunion of a fracture at the lateral corner of the
proximal phalanx of the second toe with secondary osteoarthritis at
the second MTP joint. Mild osteoarthritis at the third MTP joint is
also again seen. No acute bony or joint abnormality. Soft tissues
are negative.
IMPRESSION: No change compared to the prior examination.

Nonunion of a remote fracture of the base of the proximal phalanx of
the second toe.

Mild to moderate second and third MTP joint osteoarthritis.

## 2020-07-30 IMAGING — CR DG LUMBAR SPINE COMPLETE W/ BEND
1 series · 7 of 7 positions shown · non-contrast
Comparison: None.

CLINICAL DATA: Low back pain

EXAM:
LUMBAR SPINE - COMPLETE WITH BENDING VIEWS

[Series 1: dg lumbar spine complete w/bend 6+v · 0.14mm/px · 7 of 7 slices shown]
[im 1/7]
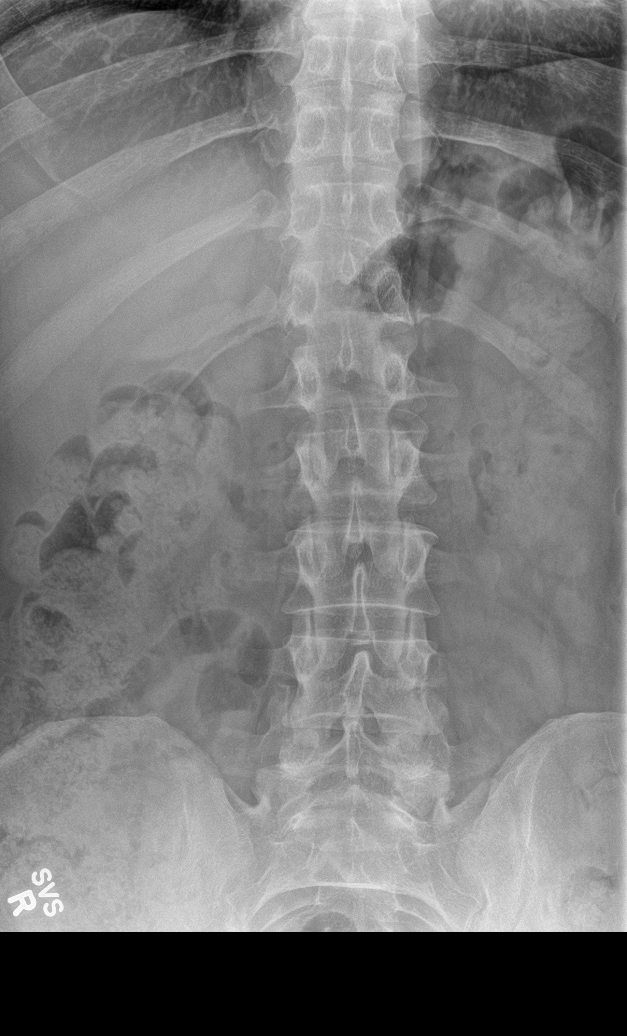
[im 2/7]
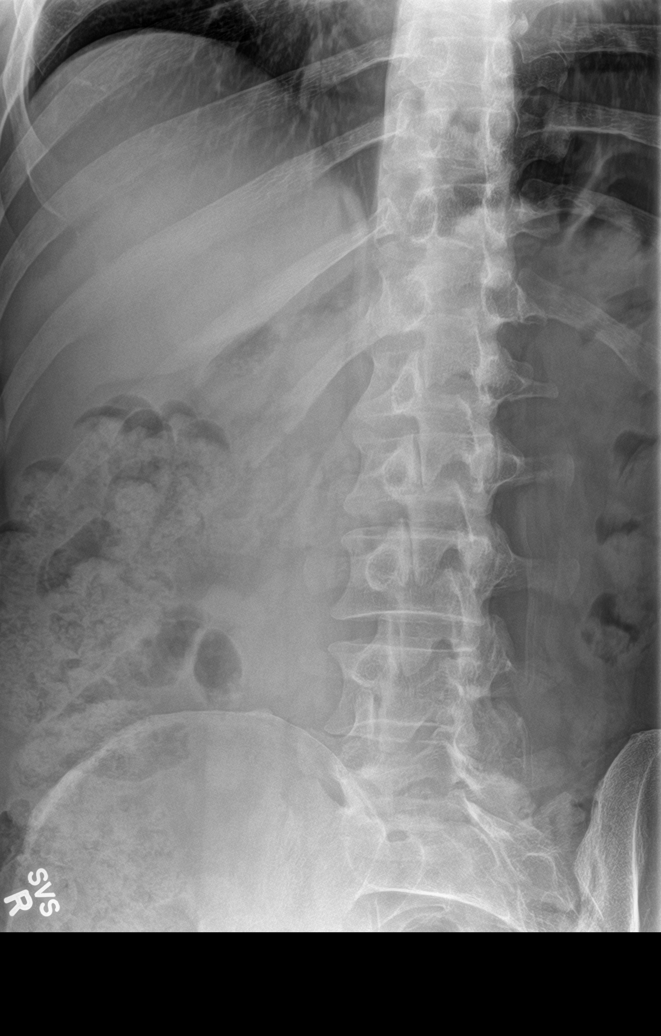
[im 3/7]
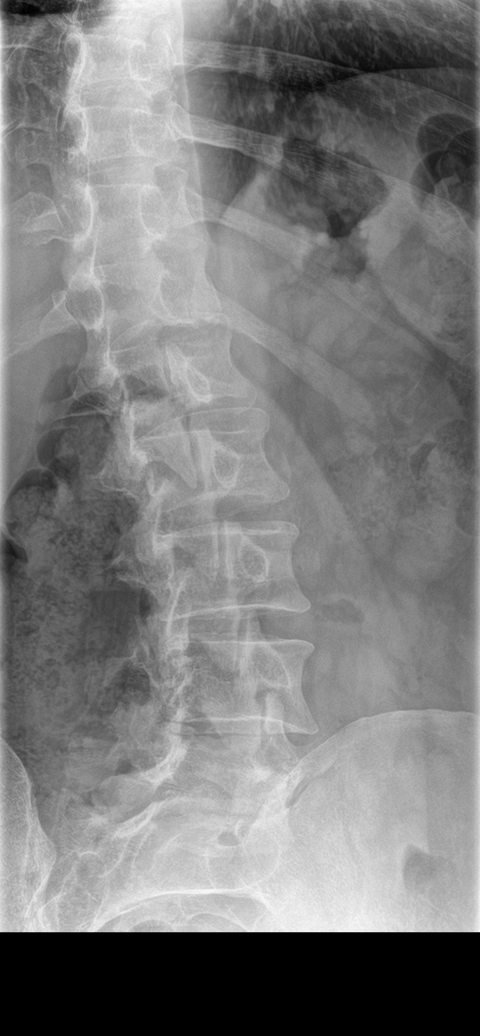
[im 4/7]
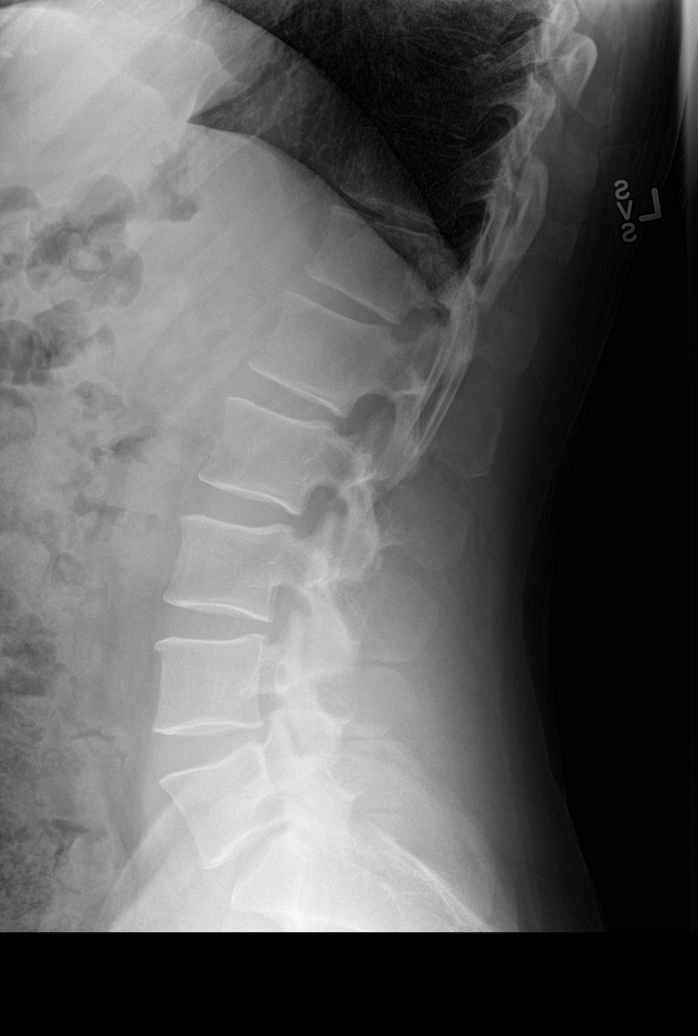
[im 5/7]
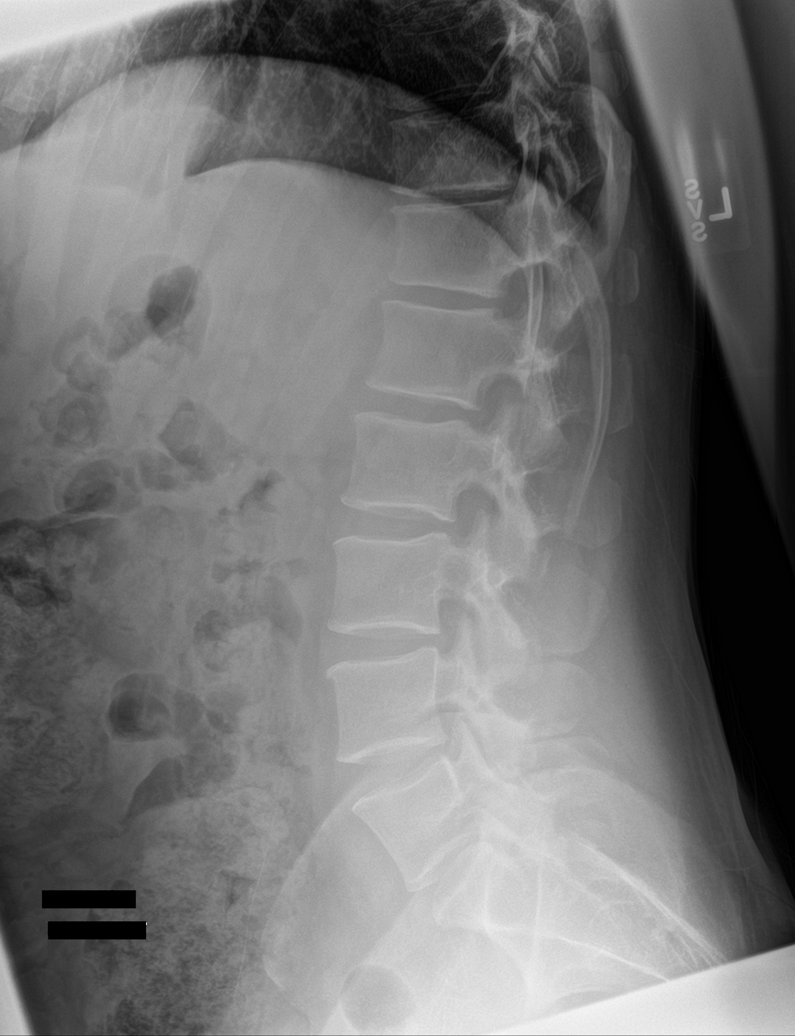
[im 6/7]
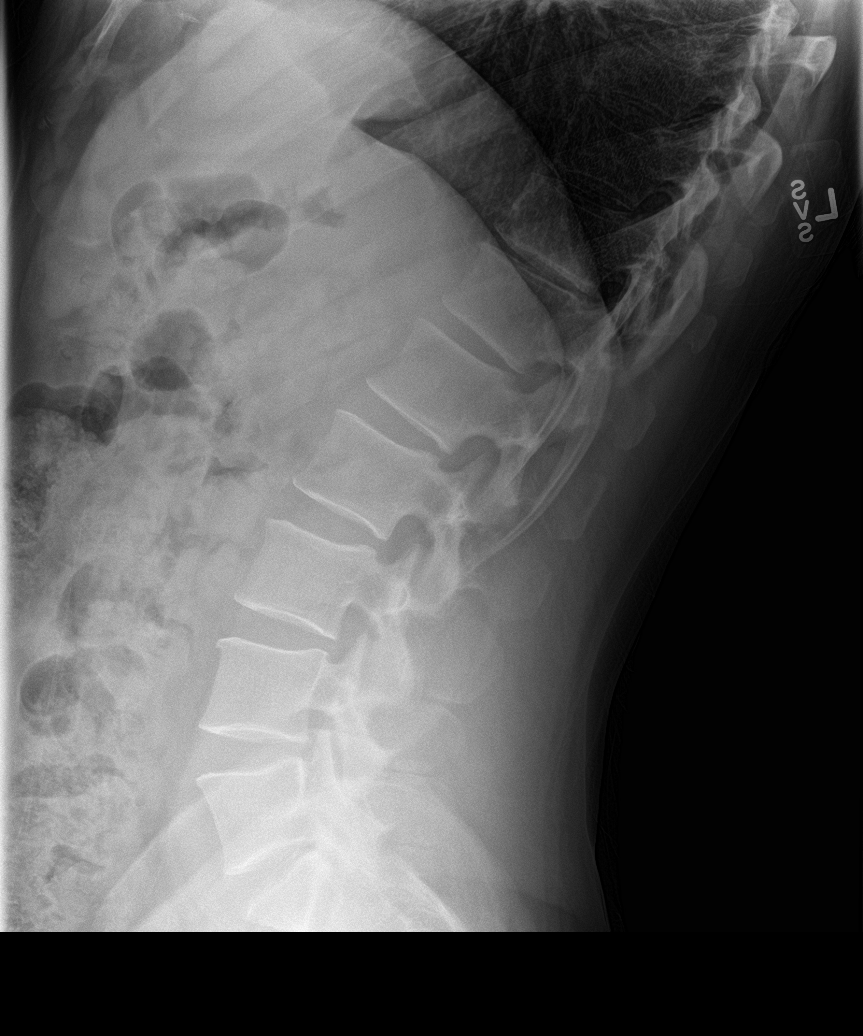
[im 7/7]
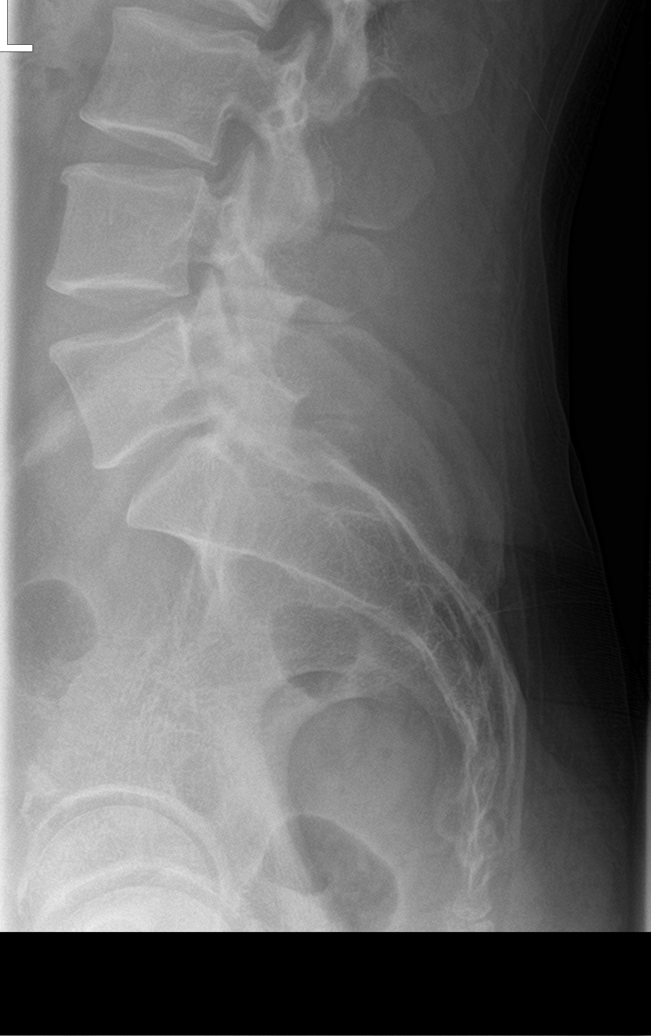

[7 of 7 positions shown; findings below may reference images not displayed]

FINDINGS: 5 nonrib bearing lumbar-type vertebral bodies.

Chronic mild T12 anterior vertebral body height loss. No acute
fracture.

No static or dynamic listhesis.  No spondylolysis.

Degenerative disease with disc height loss at L5-S1.

SI joints are unremarkable.
IMPRESSION: Mild degenerative disease with disc height loss at L5-S1.

## 2020-07-30 IMAGING — CR DG SHOULDER 2+V*R*
1 series · 3 of 3 positions shown · non-contrast
Comparison: None.

CLINICAL DATA: Right shoulder pain

EXAM:
RIGHT SHOULDER - 2+ VIEW

[Series 1: dg shoulder right · 0.14mm/px · 3 of 3 slices shown]
[im 1/3]
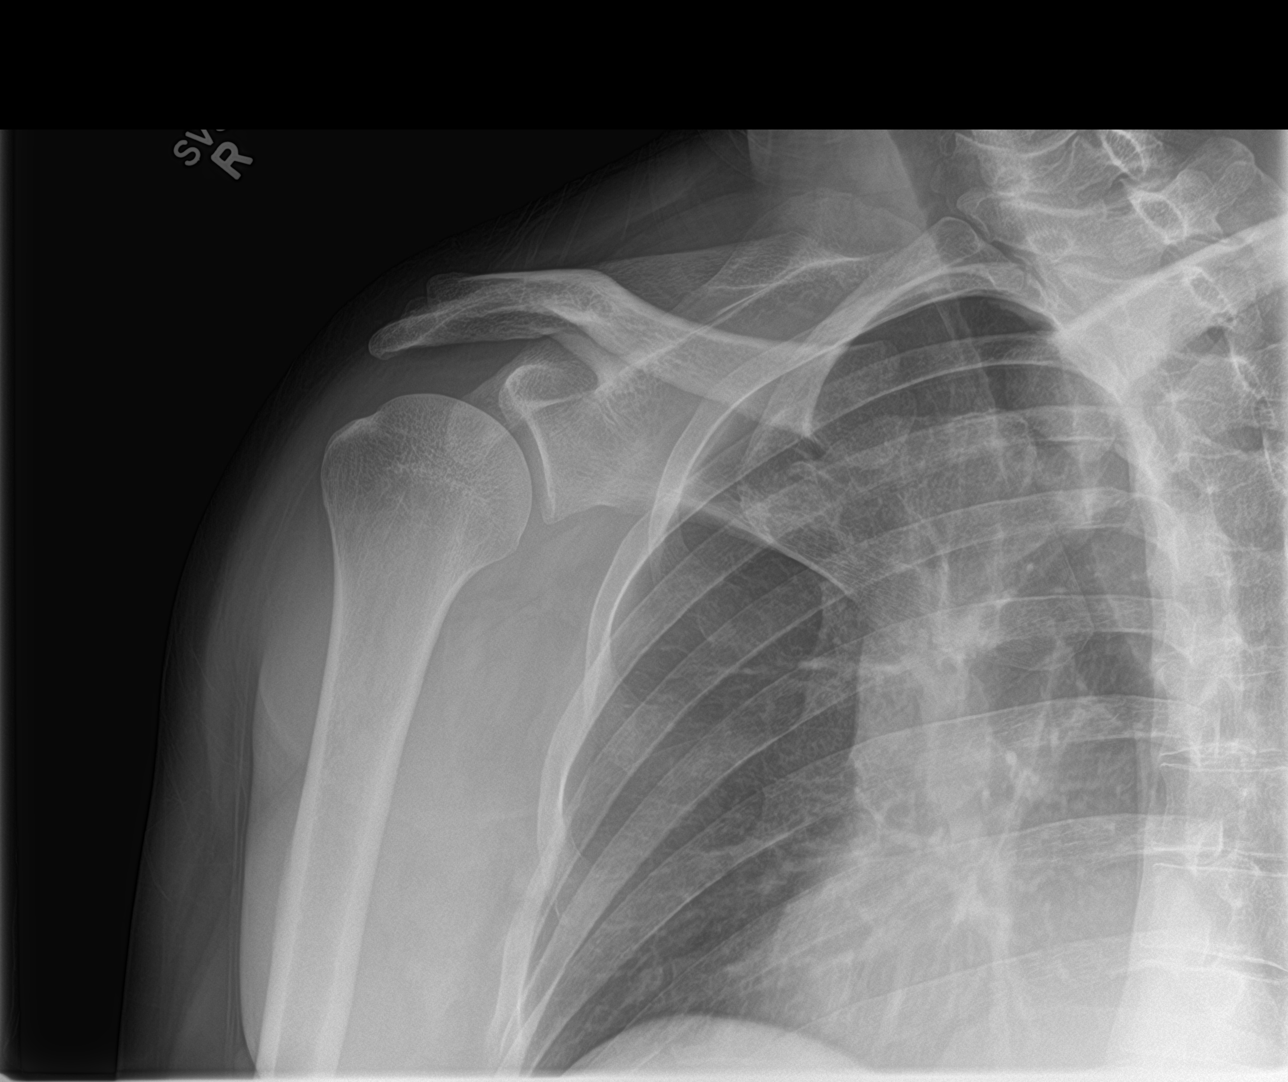
[im 2/3]
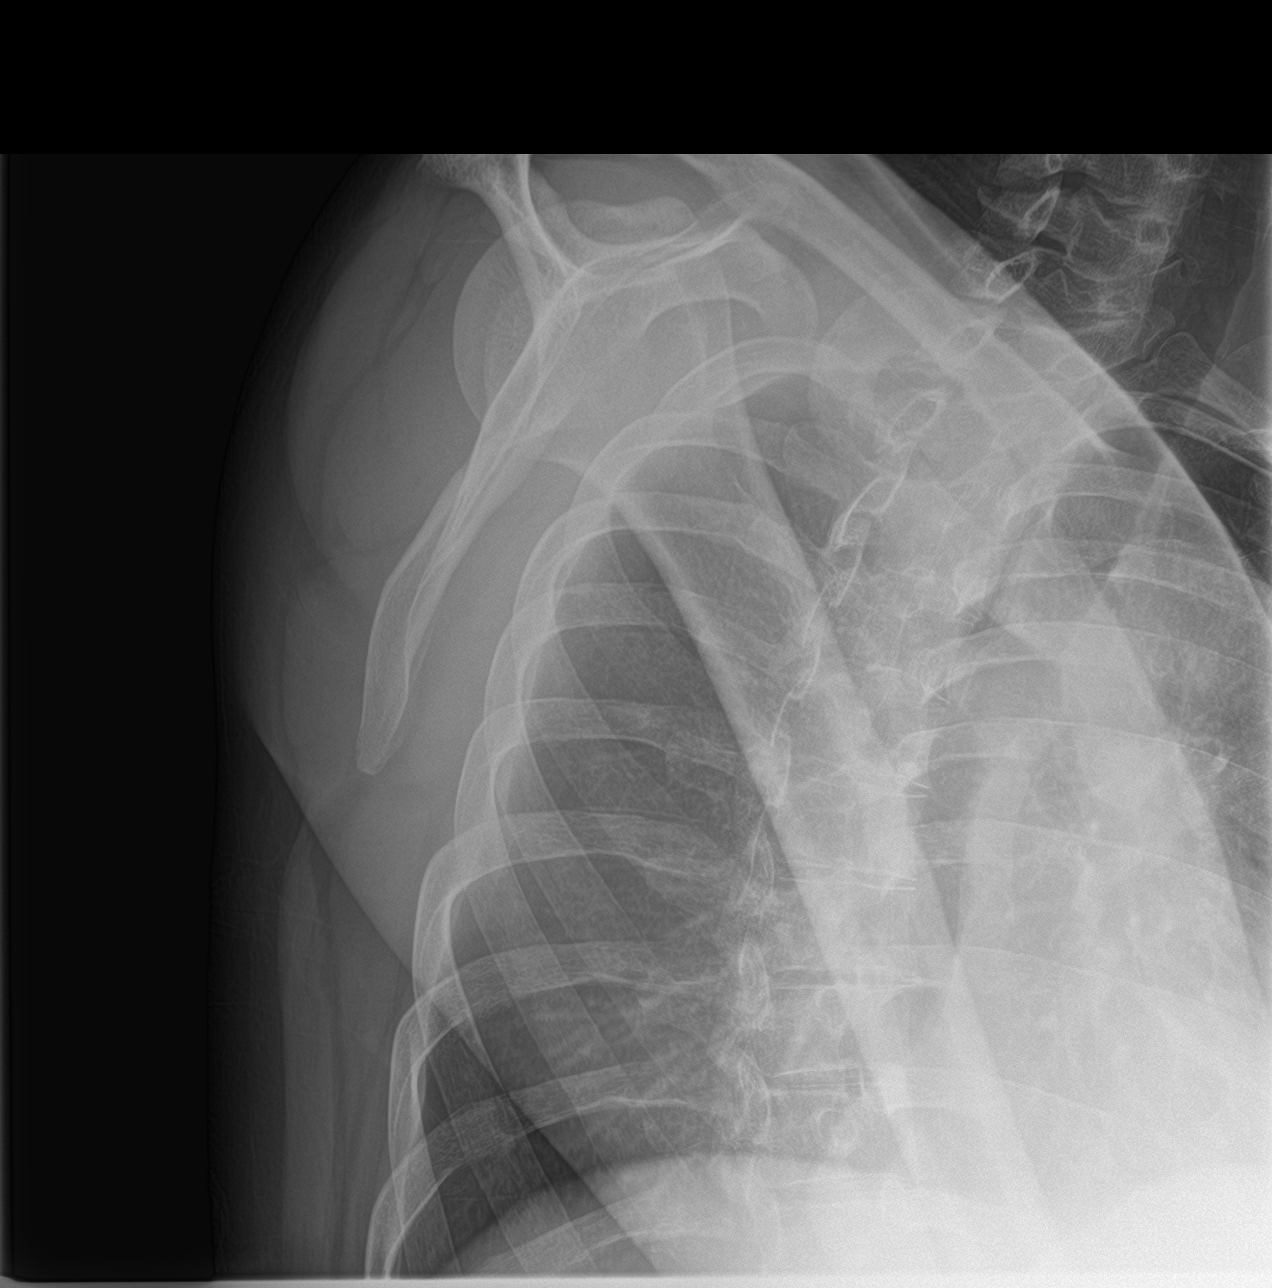
[im 3/3]
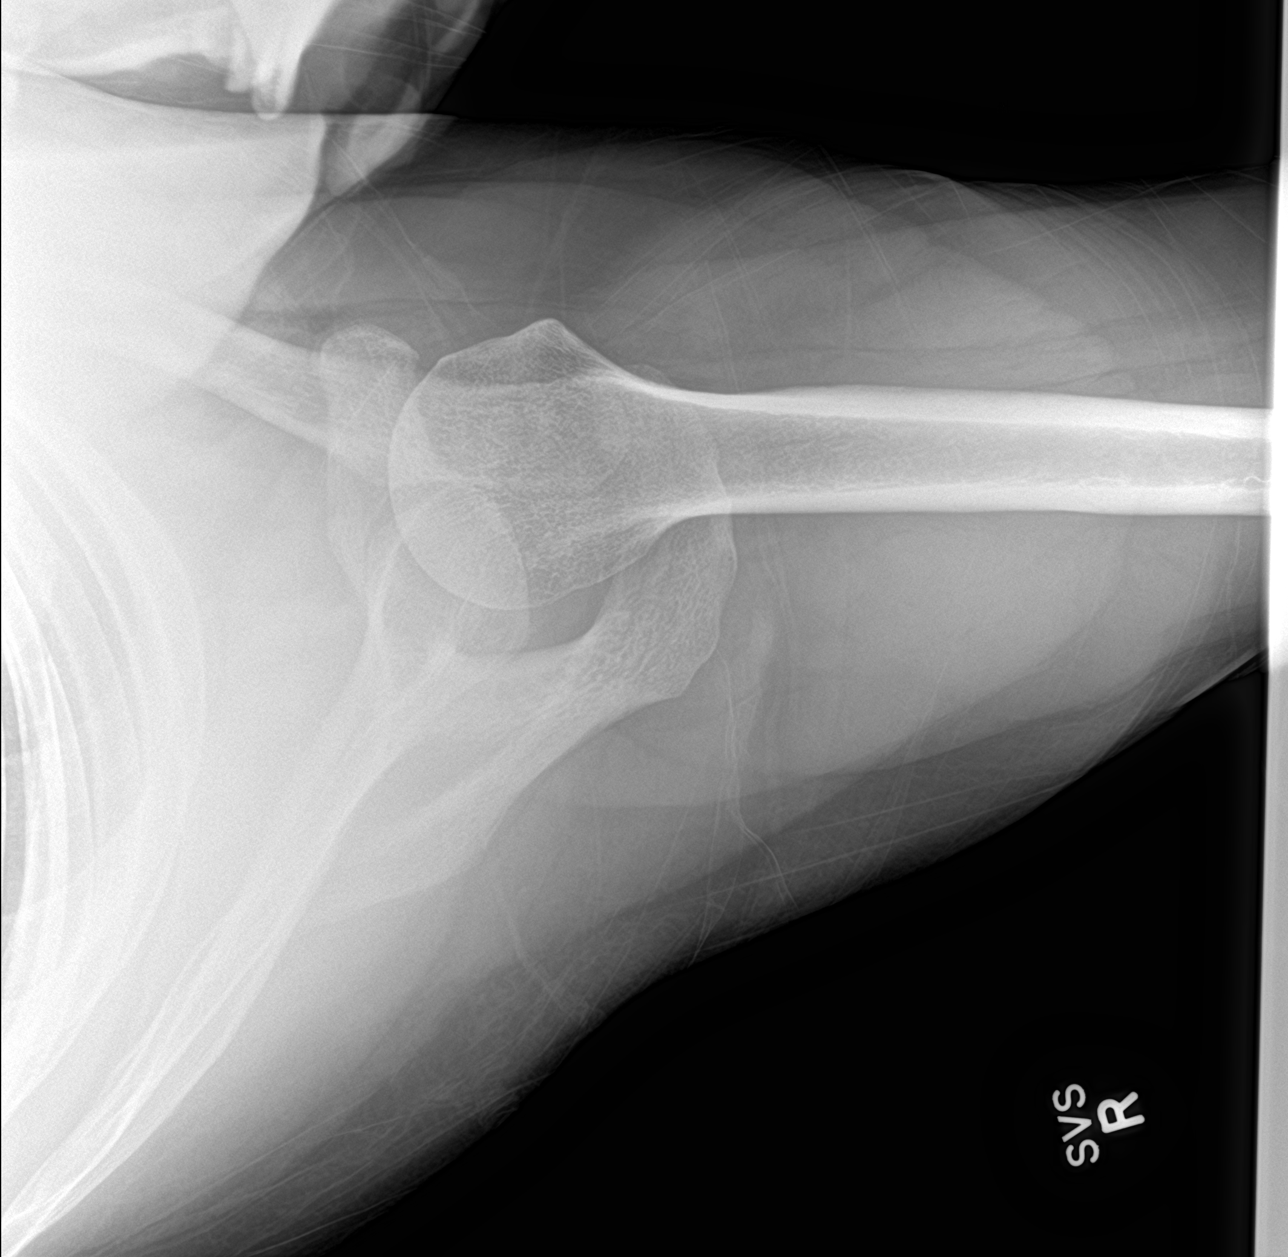

[3 of 3 positions shown; findings below may reference images not displayed]

FINDINGS: There is no evidence of fracture or dislocation. There is no
evidence of arthropathy or other focal bone abnormality. Soft
tissues are unremarkable.
IMPRESSION: No acute osseous injury of the right shoulder.

## 2020-07-30 NOTE — Patient Instructions (Signed)

## 2020-07-30 NOTE — Progress Notes (Signed)
Safety precautions to be maintained throughout the outpatient stay will include: orient to surroundings, keep bed in low position, maintain call bell within reach at all times, provide assistance with transfer out of bed and ambulation.  

## 2020-08-01 ENCOUNTER — Ambulatory Visit
Admission: EM | Admit: 2020-08-01 | Discharge: 2020-08-01 | Disposition: A | Discharge status: Home / Self Care | Payer: No Typology Code available for payment source | Attending: Family Medicine | Admitting: Family Medicine

## 2020-08-01 ENCOUNTER — Other Ambulatory Visit: Payer: Self-pay

## 2020-08-01 DIAGNOSIS — S61012A Laceration without foreign body of left thumb without damage to nail, initial encounter: Secondary | ICD-10-CM | POA: Diagnosis not present

## 2020-08-01 DIAGNOSIS — Z23 Encounter for immunization: Secondary | ICD-10-CM | POA: Diagnosis not present

## 2020-08-01 MED ORDER — TETANUS-DIPHTH-ACELL PERTUSSIS 5-2.5-18.5 LF-MCG/0.5 IM SUSY
0.5000 mL | PREFILLED_SYRINGE | Freq: Once | INTRAMUSCULAR | Status: AC
  Administered 2020-08-01: 0.5 mL via INTRAMUSCULAR

## 2020-08-01 NOTE — ED Provider Notes (Signed)
[facility]    CSN: [id number] Arrival date & time: 08/01/20  1518      History   Chief Complaint Chief Complaint  Patient presents with  . Laceration    HPI Shane Cole. is a 40 y.o. male.   HPI  Patient presents with a laceration to the dorsum proximal region of his left thumb.  He reports attempting to open a box and subsequently lacerated his finger with the sharp portion of the box cutter. He is unaware of his tetanus status.   Past Medical History:  Diagnosis Date  . Allergy   . Anxiety   . Bipolar disorder (HCC)   . Bronchitis   . Crush injury lower leg    Left lower leg  . Depression   . Gastric ulcer   . GERD (gastroesophageal reflux disease)    will awaken him in night  . Hypertension   . Migraine   . Nerve pain   . RSD (reflex sympathetic dystrophy)     Patient Active Problem List   Diagnosis Date Noted  . Pharmacologic therapy 07/30/2020  . Disorder of skeletal system 07/30/2020  . Problems influencing health status 07/30/2020  . Reflex sympathetic dystrophy of lower extremity (Left) 07/30/2020  . Complex regional pain syndrome I of lower limb (Left) 07/30/2020  . Avascular necrosis of hip (Right) (HCC) 07/30/2020  . Chronic hip pain (Right) 07/30/2020  . Chronic low back pain (2ry area of Pain) (Bilateral) w/o sciatica 07/30/2020  . Chronic shoulder pain (3ry area of Pain) (Right) 07/30/2020  . 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 Laryngopharyngeal reflux (LPR) 01/21/2018  . GERD (gastroesophageal reflux disease) 07/16/2016  . Internal hemorrhoid 07/16/2016  . Abdominal pain, chronic, epigastric 07/16/2016  . Rectal bleeding 07/16/2016  . Essential hypertension 02/26/2016  . Adhesive capsulitis of right shoulder 07/16/2015  . Thoracic outlet syndrome 02/12/2015  . Chronic narcotic use 10/26/2014  . Opioid contract exists 10/26/2014  . Smoker 05/11/2014  . Coccygeal pain, acute 05/18/2012  . Chronic pain syndrome 05/18/2012  .  Chronic diarrhea 03/29/2012  . Bipolar disorder (HCC) 03/29/2012  . Chronic foot pain (1ry area of Pain) (Left) 06/11/2011    Past Surgical History:  Procedure Laterality Date  . CHOLECYSTECTOMY N/A 01/24/2014   Procedure: LAPAROSCOPIC CHOLECYSTECTOMY;  Surgeon: Axel Filler, MD;  Location: [facility] OR;  Service: General;  Laterality: N/A;  . COLONOSCOPY    . HAND SURGERY    . MASS EXCISION Left 01/25/2013   Procedure: EXCISION MASS DORSAL ASPECT LEFT LONG FINGER DISTAL INTERPHALANGEAL JOINT;  Surgeon: Wyn Forster., MD;  Location: [facility];  Service: Orthopedics;  Laterality: Left;  Left long   . MOUTH SURGERY         Home Medications    Prior to Admission medications   Medication Sig Start Date End Date Taking? Authorizing Provider  famotidine (PEPCID) 20 MG tablet Take 20 mg by mouth 2 (two) times daily.    [provider]  gabapentin (NEURONTIN) 300 MG capsule Take 1 capsule (300 mg total) by mouth 3 (three) times daily. Patient taking differently: Take 600 mg by mouth 3 (three) times daily. 02/26/16   Nafziger, Kandee Keen, NP  Lidocaine (HM LIDOCAINE PATCH) 4 % PTCH Apply topically.    [provider]  lidocaine (LIDODERM) 5 % Place 1 patch onto the skin daily. Remove & Discard patch within 12 hours or as directed by MD    [provider]  lidocaine (LINDAMANTLE) 3 %  CREA cream Apply 1 application topically daily as needed (chronic pain). 07/25/20   Nafziger, Kandee Keen, NP  lidocaine (XYLOCAINE) 5 % ointment Apply 1 application topically as needed.    [provider]  Multiple Vitamin (MULTIVITAMIN WITH MINERALS) TABS tablet Take 1 tablet by mouth daily.    [provider]  Nerve Stimulator (TENS THERAPY PAIN RELIEF) DEVI 1 application by Does not apply route daily as needed (chronic pain).    [provider]  nortriptyline (PAMELOR) 75 MG capsule Take 1 capsule (75 mg total) by mouth at bedtime. 06/17/17   Nafziger, Kandee Keen,  NP  ondansetron (ZOFRAN ODT) 4 MG disintegrating tablet Dissolve 1 tablet (4 mg total) by mouth every 8 (eight) hours as needed for nausea or vomiting. 07/25/20   Nafziger, Kandee Keen, NP  pantoprazole (PROTONIX) 40 MG tablet Take 1 tablet (40 mg total) by mouth daily. 07/26/20   Nafziger, Kandee Keen, NP  sildenafil (REVATIO) 20 MG tablet TAKE 1 TABLET BY MOUTH 2 TIMES DAILY 05/11/20 05/11/21  Nafziger, Kandee Keen, NP  tiZANidine (ZANAFLEX) 4 MG tablet Take 1 tablet by mouth 3 (three) times daily as needed. 05/13/17   [provider]    Family History Family History  Problem Relation Age of Onset  . Hyperlipidemia Father   . Hypertension Father   . Anxiety disorder Father   . Drug abuse Father   . Cancer Paternal Grandfather        lung, colon  . Colon cancer Paternal Grandfather   . Lung cancer Paternal Grandfather   . Diabetes Paternal Grandmother   . Cancer Paternal Grandmother   . Cancer Maternal Grandmother   . Cancer Maternal Grandfather   . Lung cancer Maternal Grandfather   . Stroke Other   . Heart disease Other   . Depression Paternal Aunt   . Anxiety disorder Paternal Aunt   . Drug abuse Paternal Uncle   . Colon cancer Paternal Uncle   . Esophageal cancer Neg Hx   . Stomach cancer Neg Hx   . Rectal cancer Neg Hx     Social History Social History   Tobacco Use  . Smoking status: Current Every Day Smoker    Packs/day: 0.50    Years: 23.00    Pack years: 11.50    Types: Cigarettes, E-cigarettes  . Smokeless tobacco: Never Used  Vaping Use  . Vaping Use: Never used  Substance Use Topics  . Alcohol use: Yes    Alcohol/week: 6.0 standard drinks    Types: 6 Cans of beer per week    Comment: occasional  . Drug use: No     Allergies   Aspirin, Codeine, Penicillins, Tramadol, and Amitriptyline   Review of Systems Review of Systems Pertinent negatives listed in HPI   Physical Exam Triage Vital Signs ED Triage Vitals [08/01/20 1649]  Enc Vitals Group     BP (!)  148/82     Pulse Rate (!) 103     Resp 18     Temp 99.4 F (37.4 C)     Temp Source Oral     SpO2 96 %     Weight      Height      Head Circumference      Peak Flow      Pain Score 3     Pain Loc      Pain Edu?      Excl. in GC?    No data found.  Updated Vital Signs BP (!) 148/82 (BP  Location: Left Arm)   Pulse (!) 103   Temp 99.4 F (37.4 C) (Oral)   Resp 18   SpO2 96%   Visual Acuity Right Eye Distance:   Left Eye Distance:   Bilateral Distance:    Right Eye Near:   Left Eye Near:    Bilateral Near:     Physical Exam General appearance: alert, well developed, well nourished, cooperative  Head: Normocephalic, without obvious abnormality, atraumatic Respiratory: Respirations even and unlabored, normal respiratory rate Heart: rate and rhythm normal. No gallop or murmurs noted on exam  Abdomen: BS +, no distention, no rebound tenderness, or no mass Extremities: Left thumb 2.5 cm in length superficial laceration Skin: Skin color, texture, turgor normal. No rashes seen  Psych: Appropriate mood and affect. Neurologic: GCS 15 normal coordination normal gait  UC Treatments / Results  Labs (all labs ordered are listed, but only abnormal results are displayed) Labs Reviewed - No data to display  EKG   Radiology No results found.  Procedures Laceration Repair  Date/Time: 08/01/2020 5:58 PM Performed by: Bing Neighbors, FNP Authorized by: Bing Neighbors, FNP   Consent:    Consent obtained:  Verbal   Consent given by:  Patient   Risks discussed:  Infection Anesthesia:    Anesthesia method:  None Treatment:    Area cleansed with:  Soap and water   Debridement:  Minimal Skin repair:    Repair method:  Tissue adhesive and Steri-Strips   Number of Steri-Strips:  3 Approximation:    Approximation:  Close Repair type:    Repair type:  Simple Post-procedure details:    Dressing:  Non-adherent dressing   Procedure completion:  Tolerated    (including critical care time)  Medications Ordered in UC Medications - No data to display  Initial Impression / Assessment and Plan / UC Course  I have reviewed the triage vital signs and the nursing notes.  Pertinent labs & imaging results that were available during my care of the patient were reviewed by me and considered in my medical decision making (see chart for details).     Thumb laceration, superficial.  Bleeding controlled with tissue adhesive and Steri-Strips.  Patient placed in a splint to avoid bending the thumb as the laceration is in the dorsum bend of the proximal thumb.  Wound care instructions provided.  Monitor for signs of infection.  Return precautions given Final Clinical Impressions(s) / UC Diagnoses   Final diagnoses:  Thumb laceration, left, initial encounter  Need for diphtheria-tetanus-pertussis (Tdap) vaccine     Discharge Instructions     Leave finger in splint until tomorrow morning. After tomorrow morning, just change dressing twice daily until wound completely heals.    ED Prescriptions    None     PDMP not reviewed this encounter.   Bing Neighbors, FNP 08/01/20 1800

## 2020-08-01 NOTE — ED Triage Notes (Signed)
Pt states cut his lt thumb with a box cutter a few hours ago.

## 2020-08-01 NOTE — Discharge Instructions (Addendum)
Leave finger in splint until tomorrow morning. After tomorrow morning, just change dressing twice daily until wound completely heals.

## 2020-08-03 ENCOUNTER — Other Ambulatory Visit (HOSPITAL_COMMUNITY): Payer: Self-pay

## 2020-08-03 ENCOUNTER — Other Ambulatory Visit: Payer: Self-pay

## 2020-08-03 ENCOUNTER — Encounter: Payer: Self-pay | Admitting: Physician Assistant

## 2020-08-03 ENCOUNTER — Ambulatory Visit (INDEPENDENT_AMBULATORY_CARE_PROVIDER_SITE_OTHER): Payer: No Typology Code available for payment source | Admitting: Physician Assistant

## 2020-08-03 VITALS — BP 126/78 | HR 65 | Ht 71.0 in | Wt 210.0 lb

## 2020-08-03 DIAGNOSIS — K2100 Gastro-esophageal reflux disease with esophagitis, without bleeding: Secondary | ICD-10-CM

## 2020-08-03 DIAGNOSIS — K449 Diaphragmatic hernia without obstruction or gangrene: Secondary | ICD-10-CM | POA: Diagnosis not present

## 2020-08-03 DIAGNOSIS — R112 Nausea with vomiting, unspecified: Secondary | ICD-10-CM

## 2020-08-03 DIAGNOSIS — R109 Unspecified abdominal pain: Secondary | ICD-10-CM | POA: Diagnosis not present

## 2020-08-03 MED ORDER — PANTOPRAZOLE SODIUM 40 MG PO TBEC
40.0000 mg | DELAYED_RELEASE_TABLET | Freq: Two times a day (BID) | ORAL | 5 refills | Status: DC
  Filled 2020-08-03: qty 60, 30d supply, fill #0

## 2020-08-03 NOTE — Progress Notes (Signed)
Chief Complaint: Abdominal pain, nausea and vomiting  HPI:    Shane Cole is a 40 year old male with a past medical history of anxiety, bipolar disorder and multiple others, known to Dr. Christella Hartigan, who was referred to me by Shirline Frees, NP for a complaint of abdominal pain, nausea and vomiting.      November 2015 EGD with findings of small mild erosive esophagitis and a colonoscopy for eval of diarrhea in November 2015 which was normal except for few diverticula.    02/17/2017 patient seen in clinic and continued with right-sided abdominal pain.  Reflux is under better control on Dexilant 30 mg once daily and 75 mg of Zantac once every few days.  Told me it was "hard to swallow".  Also constant nausea.  At that time scheduled for an EGD and colonoscopy.    03/16/2017 colonoscopy normal other than some internal hemorrhoids.  This was thought to be source of bleeding.  EGD on the same day with LA grade B esophagitis in the lower third of the esophagus, small hiatal hernia, mild inflammation characterized by erythema and friability.     10/18/2018 EGD with a small hiatal hernia, mild gastritis and otherwise normal.    07/24/2020 patient seen in the ED for right-sided abdominal pain.  Labs including a CMP, CBC and urinalysis normal.  CT abdomen pelvis with contrast for right lower quadrant abdominal pain suspected appendicitis.  This showed increased size of hiatal hernia with sliding and paraesophageal component, approximately 30 to 40% of the stomach herniated into the chest, scattered fluid-filled loops of small bowel with no sign of obstruction.  Signs of bilateral femoral avascular necrosis.    Today, the patient tells me that his chronic nausea continues, but he has had no further vomiting.  Prior to presenting to the ER as above and having CT he had been vomiting daily for a week.  Now he is back to just using his Zofran "maybe once a day".  Tells me that this helps with reflux that he has in the  nighttime.  He does not know when he switched from Dexilant to Protonix but is only using 40 mg of Protonix in the morning and then ends up using Pepcid 20 mg maybe 3 times a day for breakthrough symptoms.  Tells me the pain that he was experiencing in his right side/epigastrium is better than it was.      Right now the main thing that he is worrying about is his AVN in his hips, apparently he is meeting with surgeons soon to discuss neck steps for that.    Denies fever, chills, unintentional weight loss, blood in his stool or symptoms that awaken him from sleep.  Past Medical History:  Diagnosis Date  . Allergy   . Anxiety   . Bipolar disorder (HCC)   . Bronchitis   . Crush injury lower leg    Left lower leg  . Depression   . Gastric ulcer   . GERD (gastroesophageal reflux disease)    will awaken him in night  . Hypertension   . Migraine   . Nerve pain   . RSD (reflex sympathetic dystrophy)     Past Surgical History:  Procedure Laterality Date  . CHOLECYSTECTOMY N/A 01/24/2014   Procedure: LAPAROSCOPIC CHOLECYSTECTOMY;  Surgeon: Axel Filler, MD;  Location: [facility];  Service: General;  Laterality: N/A;  . COLONOSCOPY    . HAND SURGERY    . MASS EXCISION Left 01/25/2013   Procedure: EXCISION  MASS DORSAL ASPECT LEFT LONG FINGER DISTAL INTERPHALANGEAL JOINT;  Surgeon: Wyn Forster., MD;  Location: [facility];  Service: Orthopedics;  Laterality: Left;  Left long   . MOUTH SURGERY      Current Outpatient Medications  Medication Sig Dispense Refill  . famotidine (PEPCID) 20 MG tablet Take 20 mg by mouth 2 (two) times daily.    Marland Kitchen gabapentin (NEURONTIN) 300 MG capsule Take 1 capsule (300 mg total) by mouth 3 (three) times daily. (Patient taking differently: Take 600 mg by mouth 3 (three) times daily.) 270 capsule 3  . lidocaine (LIDODERM) 5 % Place 1 patch onto the skin daily. Remove & Discard patch within 12 hours or as directed by MD    . lidocaine (LINDAMANTLE)  3 % CREA cream Apply 1 application topically daily as needed (chronic pain). 28.35 g 1  . lidocaine (XYLOCAINE) 5 % ointment Apply 1 application topically as needed.    . Lidocaine 4 % PTCH Apply topically.    . Multiple Vitamin (MULTIVITAMIN WITH MINERALS) TABS tablet Take 1 tablet by mouth daily.    . Nerve Stimulator (TENS THERAPY PAIN RELIEF) DEVI 1 application by Does not apply route daily as needed (chronic pain).    . nortriptyline (PAMELOR) 75 MG capsule Take 1 capsule (75 mg total) by mouth at bedtime. 90 capsule 1  . ondansetron (ZOFRAN ODT) 4 MG disintegrating tablet Dissolve 1 tablet (4 mg total) by mouth every 8 (eight) hours as needed for nausea or vomiting. 20 tablet 2  . pantoprazole (PROTONIX) 40 MG tablet Take 1 tablet (40 mg total) by mouth daily. 30 tablet 0  . sildenafil (REVATIO) 20 MG tablet TAKE 1 TABLET BY MOUTH 2 TIMES DAILY 180 tablet 0  . tiZANidine (ZANAFLEX) 4 MG tablet Take 1 tablet by mouth 3 (three) times daily as needed.  6   No current facility-administered medications for this visit.    Allergies as of 08/03/2020 - Review Complete 08/03/2020  Allergen Reaction Noted  . Aspirin Other (See Comments) and Tinitus 06/05/2011  . Codeine Itching 06/05/2011  . Penicillins Anaphylaxis and Other (See Comments) 06/05/2011  . Tramadol  06/05/2011  . Amitriptyline Rash 01/25/2013    Family History  Problem Relation Age of Onset  . Hyperlipidemia Father   . Hypertension Father   . Anxiety disorder Father   . Drug abuse Father   . Cancer Paternal Grandfather        lung, colon  . Colon cancer Paternal Grandfather   . Lung cancer Paternal Grandfather   . Diabetes Paternal Grandmother   . Cancer Paternal Grandmother   . Cancer Maternal Grandmother   . Cancer Maternal Grandfather   . Lung cancer Maternal Grandfather   . Stroke Other   . Heart disease Other   . Depression Paternal Aunt   . Anxiety disorder Paternal Aunt   . Drug abuse Paternal Uncle   .  Colon cancer Paternal Uncle   . Esophageal cancer Neg Hx   . Stomach cancer Neg Hx   . Rectal cancer Neg Hx     Social History   Socioeconomic History  . Marital status: Married    Spouse name: Not on file  . Number of children: 1  . Years of education: 10th grade  . Highest education level: Not on file  Occupational History  . Occupation: Disabled    Comment: Previously worked Web designer  Tobacco Use  . Smoking status: Current Every Day  Smoker    Packs/day: 0.50    Years: 23.00    Pack years: 11.50    Types: Cigarettes, E-cigarettes  . Smokeless tobacco: Never Used  Vaping Use  . Vaping Use: Never used  Substance and Sexual Activity  . Alcohol use: Yes    Alcohol/week: 6.0 standard drinks    Types: 6 Cans of beer per week    Comment: occasional  . Drug use: No  . Sexual activity: Yes    Partners: Female  Other Topics Concern  . Not on file  Social History Narrative   05/24/12  [name] was born [place]    He has 2 sisters.    He reports that his childhood was "lousy."    He completed the 10th grade.    He has been married for 5 years, and is currently separated for 3 weeks.    He has a daughter who is 433-1/90 years old.    He has been unemployed for 2 years, and is disabled due to an on-the-job work accident.    He is currently living with his aunt and cousin.    He reports that his hobbies are sports and dogs.    He reports that he is spiritual, but not religious.    He states that his aunt and his father are his social support system.    He denies any current legal problems, but got a DUI in 2007. 05/24/12 [facility]      Social Determinants of Health   Financial Resource Strain: Not on file  Food Insecurity: Not on file  Transportation Needs: Not on file  Physical Activity: Not on file  Stress: Not on file  Social Connections: Not on file  Intimate Partner Violence: Not on file    Review of Systems:     Constitutional: No weight loss, fever or chills Cardiovascular: No chest pain Respiratory: No SOB  Gastrointestinal: See HPI and otherwise negative   Physical Exam:  Vital signs: BP 126/78   Pulse 65   Ht 5\' 11"  (1.803 m)   Wt 210 lb (95.3 kg)   BMI 29.29 kg/m   Constitutional:   Pleasant Caucasian male appears to be in NAD, Well developed, Well nourished, alert and cooperative Respiratory: Respirations even and unlabored. Lungs clear to auscultation bilaterally.   No wheezes, crackles, or rhonchi.  Cardiovascular: Normal S1, S2. No MRG. Regular rate and rhythm. No peripheral edema, cyanosis or pallor.  Gastrointestinal:  Soft, nondistended, mild right sided ttp. No rebound or guarding. Normal bowel sounds. No appreciable masses or hepatomegaly. Rectal:  Not performed.  Psychiatric: Oriented to person, place and time. Demonstrates good judgement and reason without abnormal affect or behaviors.  RELEVANT LABS AND IMAGING: CBC    Component Value Date/Time   WBC 9.2 07/24/2020 1652   RBC 5.49 07/24/2020 1652   HGB 16.9 07/24/2020 1652   HCT 50.4 07/24/2020 1652   PLT 250 07/24/2020 1652   MCV 91.8 07/24/2020 1652   MCV 89.5 10/26/2014 1827   MCH 30.8 07/24/2020 1652   MCHC 33.5 07/24/2020 1652   RDW 12.3 07/24/2020 1652   LYMPHSABS 2.1 07/24/2020 1652   MONOABS 0.6 07/24/2020 1652   EOSABS 0.1 07/24/2020 1652   BASOSABS 0.0 07/24/2020 1652    CMP     Component Value Date/Time   NA 140 07/30/2020 1530   K 5.1 07/30/2020 1530   CL 96 07/30/2020 1530   CO2 28 07/24/2020 1652  GLUCOSE 93 07/30/2020 1530   GLUCOSE 72 07/24/2020 1652   BUN 10 07/30/2020 1530   CREATININE 1.00 07/30/2020 1530   CREATININE 0.95 10/25/2014 2032   CALCIUM 10.1 07/30/2020 1530   PROT 7.7 07/30/2020 1530   ALBUMIN 5.6 (H) 07/30/2020 1530   AST 22 07/30/2020 1530   ALT 36 07/24/2020 1652   ALKPHOS 145 (H) 07/30/2020 1530   BILITOT 0.4 07/30/2020 1530   GFRNONAA >60 07/24/2020 1652    GFRAA >60 07/01/2017 1645    Assessment: 1.  GERD: Multiple EGDs in the past, the last in 2020 was mostly normal, symptoms likely increased lately with increasing hiatal hernia size 2.  Chronic nausea: Patient has always had issues with nausea without identifiable cause, today tells me it is really because of his reflux 3.  Hiatal hernia: On recent CT this was larger, though he had been vomiting which is likely the cause  Plan: 1.  Increased Pantoprazole to 40 mg twice daily, 30-60 minutes before breakfast and dinner.  Prescribed #60 with 5 refills. 2.  Encouraged the patient uses Pepcid 20 mg twice daily as needed. 3.  Discussed with patient that if he has increased epigastric pain or his reflux symptoms are not controlled with the regimen above then we could consider repeat EGD just to have a look at things given that recent CT showed increased hiatal hernia (though likely this is just from him vomiting). 4.  Patient to follow in clinic with [place] as needed in the future.  Hyacinth Meeker, PA-C [facility] 08/03/2020, 9:00 AM  Cc: Shirline Frees, NP

## 2020-08-03 NOTE — Patient Instructions (Signed)
If you are age 41 or older, your body mass index should be between 23-30. Your Body mass index is 29.29 kg/m. If this is out of the aforementioned range listed, please consider follow up with your Primary Care Provider.  If you are age 74 or younger, your body mass index should be between 19-25. Your Body mass index is 29.29 kg/m. If this is out of the aformentioned range listed, please consider follow up with your Primary Care Provider.   We have sent the following medications to your pharmacy for you to pick up at your convenience: Pantoprazole 40 mg   Continue Pepcid 20 mg twice daily as needed.   Thank you for choosing me and [facility].  Hyacinth Meeker, PA-C

## 2020-08-03 NOTE — Progress Notes (Signed)
I agree with the above note, plan 

## 2020-08-06 LAB — COMP. METABOLIC PANEL (12)
AST: 22 IU/L (ref 0–40)
Albumin/Globulin Ratio: 2.7 — ABNORMAL HIGH (ref 1.2–2.2)
Albumin: 5.6 g/dL — ABNORMAL HIGH (ref 4.0–5.0)
Alkaline Phosphatase: 145 IU/L — ABNORMAL HIGH (ref 44–121)
BUN/Creatinine Ratio: 10 (ref 9–20)
BUN: 10 mg/dL (ref 6–24)
Bilirubin Total: 0.4 mg/dL (ref 0.0–1.2)
Calcium: 10.1 mg/dL (ref 8.7–10.2)
Chloride: 96 mmol/L (ref 96–106)
Creatinine, Ser: 1 mg/dL (ref 0.76–1.27)
Globulin, Total: 2.1 g/dL (ref 1.5–4.5)
Glucose: 93 mg/dL (ref 65–99)
Potassium: 5.1 mmol/L (ref 3.5–5.2)
Sodium: 140 mmol/L (ref 134–144)
Total Protein: 7.7 g/dL (ref 6.0–8.5)
eGFR: 98 mL/min/{1.73_m2} (ref >59)

## 2020-08-06 LAB — SEDIMENTATION RATE: Sed Rate: 11 mm/hr (ref 0–15)

## 2020-08-06 LAB — 25-HYDROXY VITAMIN D LCMS D2+D3
25-Hydroxy, Vitamin D-2: 1.1 ng/mL
25-Hydroxy, Vitamin D-3: 27 ng/mL
25-Hydroxy, Vitamin D: 28 ng/mL — ABNORMAL LOW

## 2020-08-06 LAB — VITAMIN B12: Vitamin B-12: 1282 pg/mL — ABNORMAL HIGH (ref 232–1245)

## 2020-08-06 LAB — C-REACTIVE PROTEIN: CRP: 2 mg/L (ref 0–10)

## 2020-08-06 LAB — MAGNESIUM: Magnesium: 1.8 mg/dL (ref 1.6–2.3)

## 2020-08-07 ENCOUNTER — Encounter: Payer: Self-pay | Admitting: Orthopaedic Surgery

## 2020-08-07 ENCOUNTER — Telehealth: Payer: Self-pay | Admitting: Adult Health

## 2020-08-07 ENCOUNTER — Ambulatory Visit: Payer: No Typology Code available for payment source | Admitting: Orthopaedic Surgery

## 2020-08-07 ENCOUNTER — Other Ambulatory Visit (HOSPITAL_COMMUNITY): Payer: Self-pay

## 2020-08-07 DIAGNOSIS — M87051 Idiopathic aseptic necrosis of right femur: Secondary | ICD-10-CM

## 2020-08-07 MED ORDER — SILDENAFIL CITRATE 20 MG PO TABS
ORAL_TABLET | Freq: Two times a day (BID) | ORAL | 1 refills | Status: DC
  Filled 2020-08-07: qty 180, 90d supply, fill #0
  Filled 2020-10-31: qty 180, 90d supply, fill #1

## 2020-08-07 NOTE — Telephone Encounter (Signed)
Rx sent in

## 2020-08-07 NOTE — Progress Notes (Signed)
Office Visit Note   Patient: Shane Cole.           Date of Birth: [birth year]           MRN: [id number] Visit Date: 08/07/2020              Requested by: Shirline Frees, NP [address] [place],  [place] [address] PCP: Shirline Frees, NP   Assessment & Plan: Visit Diagnoses:  1. Avascular necrosis of hip (Right) (HCC)     Plan: CT scan of the pelvis shows sclerotic lesions of the right femoral head consistent with AVN.  He has some small lesions in the left femoral head as well but they do not seem to be very symptomatic.  We will need to obtain an MRI of the right hip to assess the full extent of the AVN and to determine if he is a candidate for core decompression.  Follow-up after the MRI.  Follow-Up Instructions: Return for After MRI.   Orders:  No orders of the defined types were placed in this encounter.  No orders of the defined types were placed in this encounter.     Procedures: No procedures performed   Clinical Data: No additional findings.   Subjective: Chief Complaint  Patient presents with  . Right Hip - Pain  . Left Hip - Pain    Shane Cole is a 40 year old gentleman here for bilateral hip pain mainly the right hip.  He has been having groin pain that feels like a pulled muscle.  He has been limping with ambulation.  Takes ibuprofen Aleve Tylenol oxycodone and lidocaine for chronic pain.  He was off and on prednisone for about 15 years due to his back.  Denies any other risk factors for AVN.  Denies any injuries.  Currently unemployed.   Review of Systems  Constitutional: Negative.   All other systems reviewed and are negative.    Objective: Vital Signs: There were no vitals taken for this visit.  Physical Exam Vitals and nursing note reviewed.  Constitutional:      Appearance: He is well-developed.  HENT:     Head: Normocephalic and atraumatic.  Eyes:     Pupils: Pupils are equal, round, and reactive to light.  Pulmonary:      Effort: Pulmonary effort is normal.  Abdominal:     Palpations: Abdomen is soft.  Musculoskeletal:        General: Normal range of motion.     Cervical back: Neck supple.  Skin:    General: Skin is warm.  Neurological:     Mental Status: He is alert and oriented to person, place, and time.  Psychiatric:        Behavior: Behavior normal.        Thought Content: Thought content normal.        Judgment: Judgment normal.     Ortho Exam Right hip shows pain with internal and external rotation.  Antalgic gait. No real symptoms coming from the left hip. Specialty Comments:  No specialty comments available.  Imaging: No results found.   PMFS History: Patient Active Problem List   Diagnosis Date Noted  . Pharmacologic therapy 07/30/2020  . Disorder of skeletal system 07/30/2020  . Problems influencing health status 07/30/2020  . Reflex sympathetic dystrophy of lower extremity (Left) 07/30/2020  . Complex regional pain syndrome I of lower limb (Left) 07/30/2020  . Avascular necrosis of hip (Right) (HCC) 07/30/2020  . Chronic hip pain (Right) 07/30/2020  .  Chronic low back pain (2ry area of Pain) (Bilateral) w/o sciatica 07/30/2020  . Chronic shoulder pain (3ry area of Pain) (Right) 07/30/2020  . 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 Laryngopharyngeal reflux (LPR) 01/21/2018  . GERD (gastroesophageal reflux disease) 07/16/2016  . Internal hemorrhoid 07/16/2016  . Abdominal pain, chronic, epigastric 07/16/2016  . Rectal bleeding 07/16/2016  . Essential hypertension 02/26/2016  . Adhesive capsulitis of right shoulder 07/16/2015  . Thoracic outlet syndrome 02/12/2015  . Chronic narcotic use 10/26/2014  . Opioid contract exists 10/26/2014  . Smoker 05/11/2014  . Coccygeal pain, acute 05/18/2012  . Chronic pain syndrome 05/18/2012  . Chronic diarrhea 03/29/2012  . Bipolar disorder (HCC) 03/29/2012  . Chronic foot pain (1ry area of Pain) (Left) 06/11/2011   Past Medical History:   Diagnosis Date  . Allergy   . Anxiety   . Bipolar disorder (HCC)   . Bronchitis   . Crush injury lower leg    Left lower leg  . Depression   . Gastric ulcer   . GERD (gastroesophageal reflux disease)    will awaken him in night  . Hypertension   . Migraine   . Nerve pain   . RSD (reflex sympathetic dystrophy)     Family History  Problem Relation Age of Onset  . Hyperlipidemia Father   . Hypertension Father   . Anxiety disorder Father   . Drug abuse Father   . Cancer Paternal Grandfather        lung, colon  . Colon cancer Paternal Grandfather   . Lung cancer Paternal Grandfather   . Diabetes Paternal Grandmother   . Cancer Paternal Grandmother   . Cancer Maternal Grandmother   . Cancer Maternal Grandfather   . Lung cancer Maternal Grandfather   . Stroke Other   . Heart disease Other   . Depression Paternal Aunt   . Anxiety disorder Paternal Aunt   . Drug abuse Paternal Uncle   . Colon cancer Paternal Uncle   . Esophageal cancer Neg Hx   . Stomach cancer Neg Hx   . Rectal cancer Neg Hx     Past Surgical History:  Procedure Laterality Date  . CHOLECYSTECTOMY N/A 01/24/2014   Procedure: LAPAROSCOPIC CHOLECYSTECTOMY;  Surgeon: Axel Filler, MD;  Location: [facility] OR;  Service: General;  Laterality: N/A;  . COLONOSCOPY    . HAND SURGERY    . MASS EXCISION Left 01/25/2013   Procedure: EXCISION MASS DORSAL ASPECT LEFT LONG FINGER DISTAL INTERPHALANGEAL JOINT;  Surgeon: Wyn Forster., MD;  Location: [facility];  Service: Orthopedics;  Laterality: Left;  Left long   . MOUTH SURGERY     Social History   Occupational History  . Occupation: Disabled    Comment: Previously worked Web designer  Tobacco Use  . Smoking status: Current Every Day Smoker    Packs/day: 0.50    Years: 23.00    Pack years: 11.50    Types: Cigarettes, E-cigarettes  . Smokeless tobacco: Never Used  Vaping Use  . Vaping Use: Never used  Substance and  Sexual Activity  . Alcohol use: Yes    Alcohol/week: 6.0 standard drinks    Types: 6 Cans of beer per week    Comment: occasional  . Drug use: No  . Sexual activity: Yes    Partners: Female

## 2020-08-07 NOTE — Telephone Encounter (Signed)
I called and spoke with patient. He is not an actual patient at the pain management clinic yet. They are doing tests that won't be performed until 6/27 and then the doctor will decide if he wants to take him as a patient. I advised him I'd send a message back to you to see what you wanted to do.

## 2020-08-07 NOTE — Telephone Encounter (Signed)
Patient is calling and requesting a refill for sildenafil (REVATIO) 20 MG tablet to be sent to  [facility]  [address], [place] [address]  Phone:  [phone] Fax:  [phone]    CB is [phone]

## 2020-08-07 NOTE — Telephone Encounter (Signed)
Patient is needing a refill on his oxyCODONE (OXY IR/ROXICODONE) 5 MG immediate release tablet   [facility] Phone:  [phone]  Fax:  [phone]

## 2020-08-08 ENCOUNTER — Other Ambulatory Visit: Payer: Self-pay

## 2020-08-08 DIAGNOSIS — M87051 Idiopathic aseptic necrosis of right femur: Secondary | ICD-10-CM

## 2020-08-08 LAB — COMPLIANCE DRUG ANALYSIS, UR

## 2020-08-09 ENCOUNTER — Other Ambulatory Visit (HOSPITAL_COMMUNITY): Payer: Self-pay

## 2020-08-09 ENCOUNTER — Other Ambulatory Visit: Payer: Self-pay | Admitting: Adult Health

## 2020-08-09 MED ORDER — OXYCODONE HCL 5 MG PO TABS
5.0000 mg | ORAL_TABLET | Freq: Four times a day (QID) | ORAL | 0 refills | Status: AC
  Filled 2020-08-09: qty 120, 30d supply, fill #0

## 2020-08-24 ENCOUNTER — Other Ambulatory Visit (HOSPITAL_COMMUNITY): Payer: Self-pay

## 2020-08-24 ENCOUNTER — Other Ambulatory Visit: Payer: Self-pay | Admitting: Adult Health

## 2020-08-24 ENCOUNTER — Telehealth: Payer: Self-pay | Admitting: Orthopaedic Surgery

## 2020-08-25 ENCOUNTER — Ambulatory Visit
Admission: RE | Admit: 2020-08-25 | Discharge: 2020-08-25 | Disposition: A | Discharge status: Home / Self Care | Payer: No Typology Code available for payment source | Source: Ambulatory Visit | Attending: Orthopaedic Surgery | Admitting: Orthopaedic Surgery

## 2020-08-25 DIAGNOSIS — M87051 Idiopathic aseptic necrosis of right femur: Secondary | ICD-10-CM

## 2020-08-25 IMAGING — MR MR HIP*R* W/O CM
5 series · 40 of 40 positions shown · non-contrast
Comparison: [DATE] radiographs and CT pelvis from [DATE]

CLINICAL DATA: Chronic right hip pain.  Hip avascular necrosis.

EXAM:
MR OF THE RIGHT HIP WITHOUT CONTRAST
TECHNIQUE: Multiplanar, multisequence MR imaging was performed. No intravenous
contrast was administered.

[Series 3: T1 · coronal · 4.0mm · 1.48mm/px · 9 of 41 slices shown]
[im 1/41]
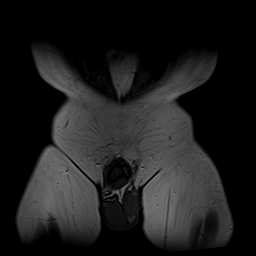
[im 6/41]
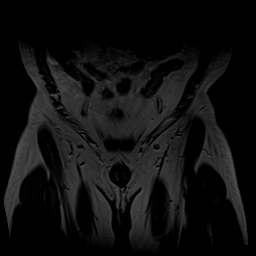
[im 11/41]
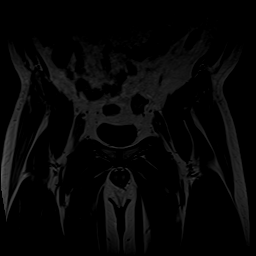
[im 16/41]
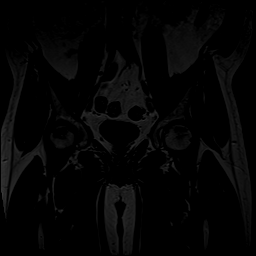
[im 21/41]
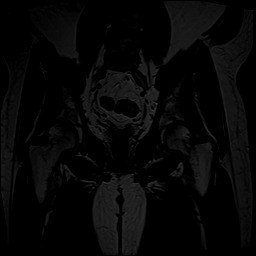
[im 26/41]
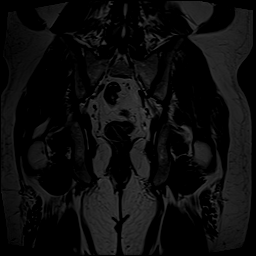
[im 31/41]
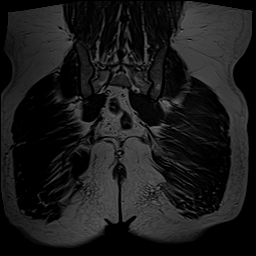
[im 36/41]
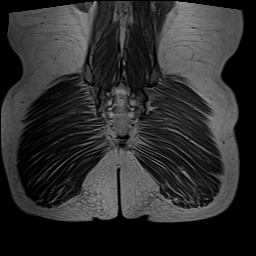
[im 41/41]
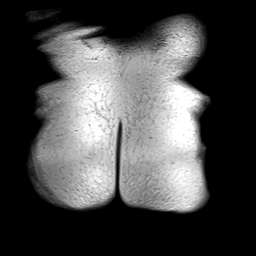

[Series 4: T2 fat-sat · coronal · 4.0mm · 1.48mm/px · 9 of 41 slices shown (1 of 2)]
[im 1/41]
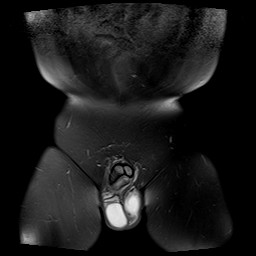
[im 6/41]
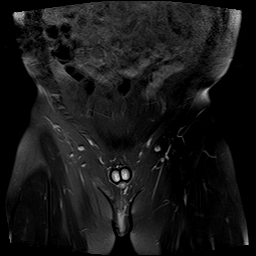
[im 11/41]
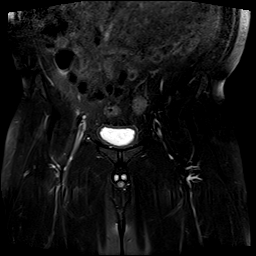
[im 16/41]
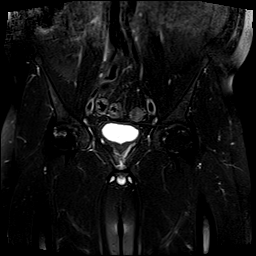
[im 21/41]
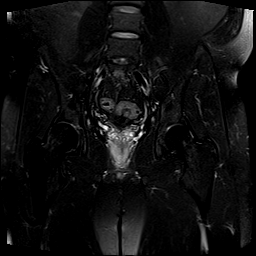
[im 26/41]
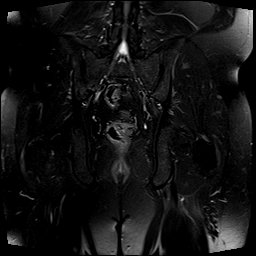
[im 31/41]
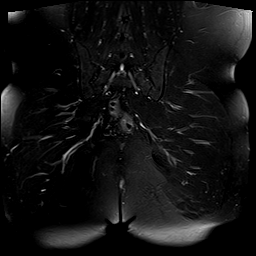
[im 36/41]
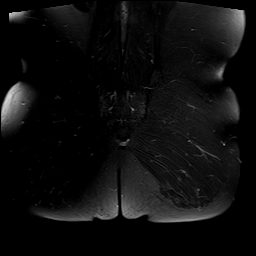
[im 41/41]
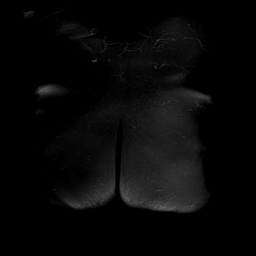

[Series 5: T2 fat-sat · axial · 4.0mm · 0.78mm/px · z∈[-168,+91]mm · 12 of 55 slices shown (2 of 2)]
[im 1/55]
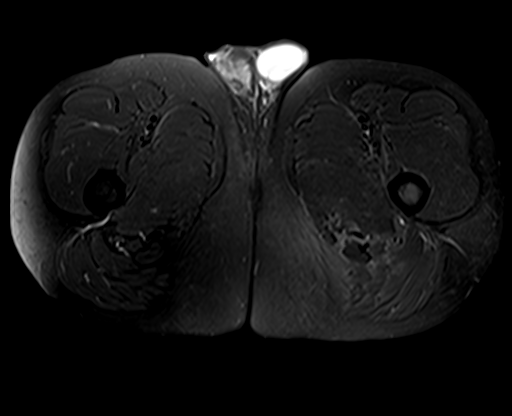
[im 5/55]
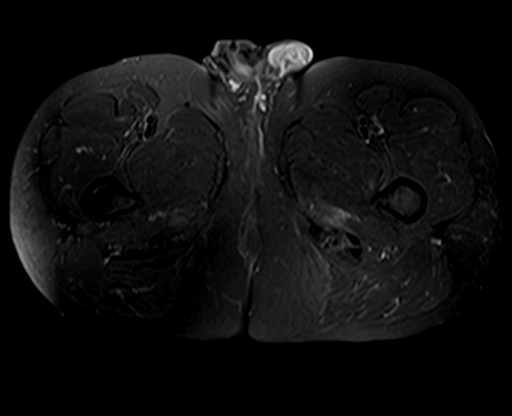
[im 10/55]
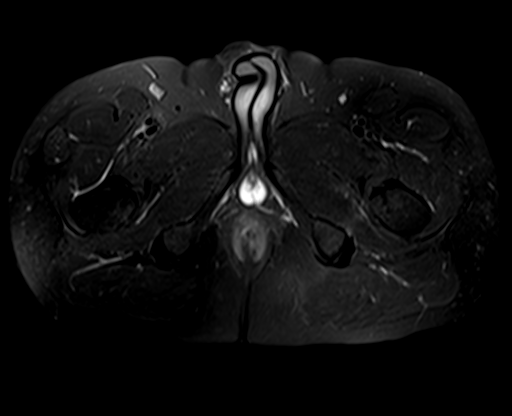
[im 15/55]
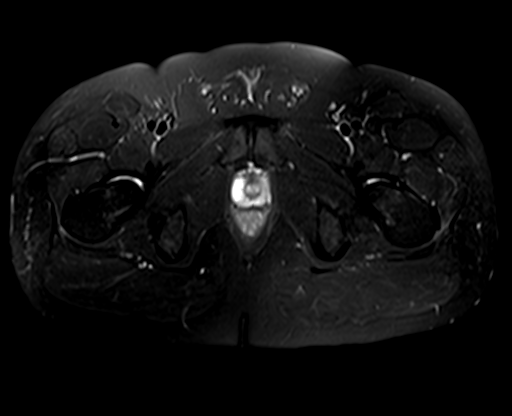
[im 20/55]
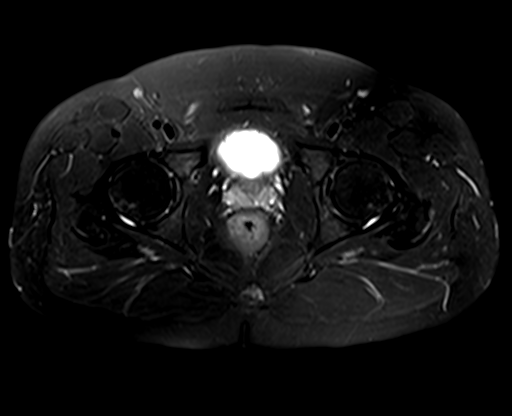
[im 25/55]
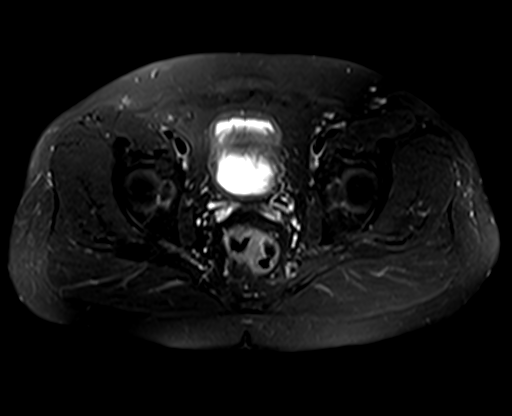
[im 30/55]
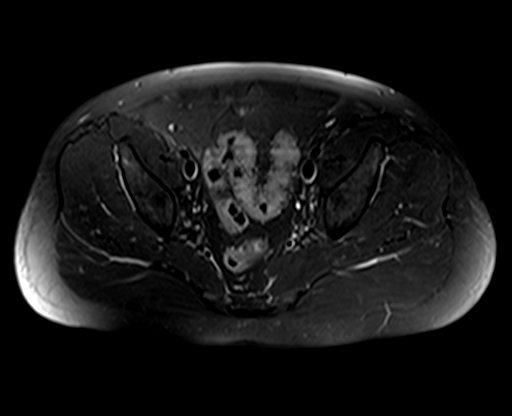
[im 35/55]
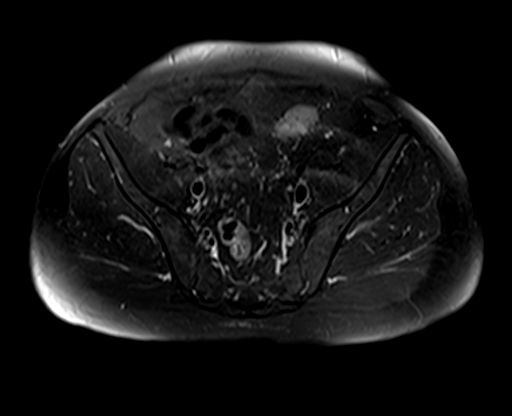
[im 40/55]
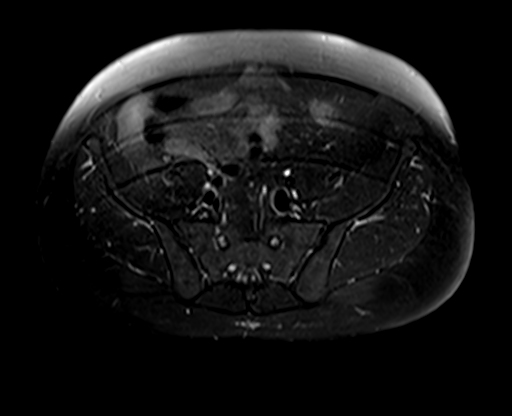
[im 45/55]
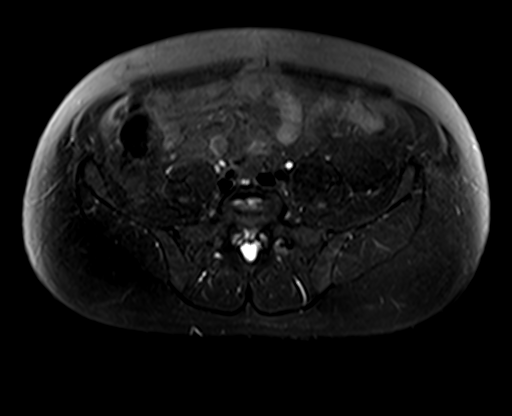
[im 50/55]
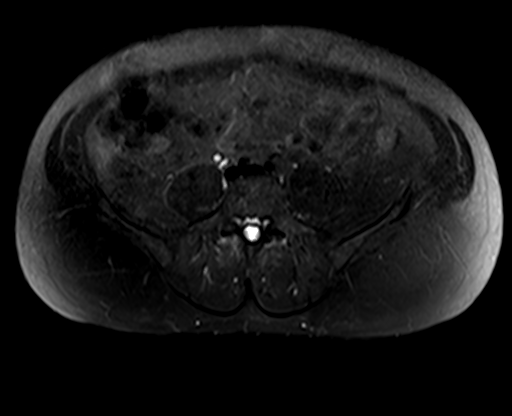
[im 55/55]
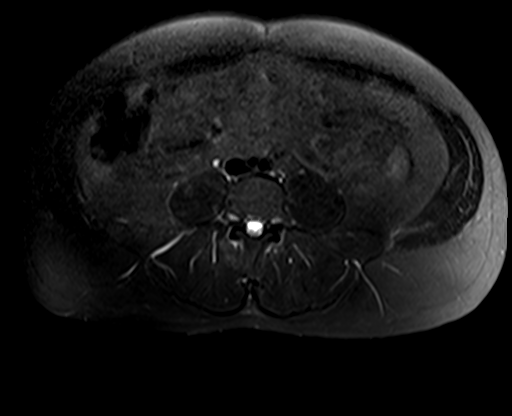

[Series 7: PD fat-sat · sagittal · 4.0mm · 0.70mm/px · 5 of 24 slices shown (1 of 2)]
[im 1/24]
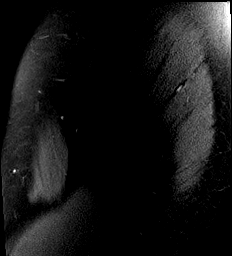
[im 6/24]
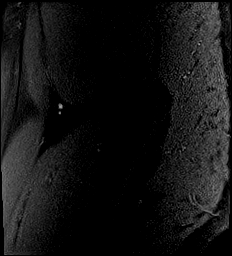
[im 12/24]
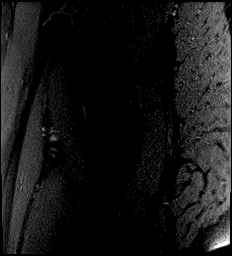
[im 18/24]
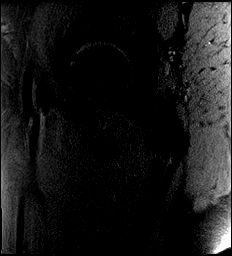
[im 24/24]
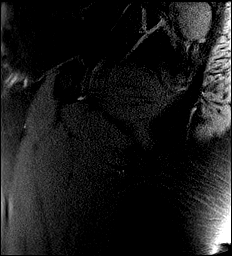

[Series 8: PD fat-sat · coronal · 4.0mm · 0.70mm/px · 5 of 23 slices shown (2 of 2)]
[im 1/23]
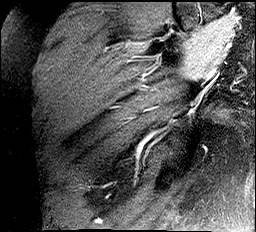
[im 6/23]
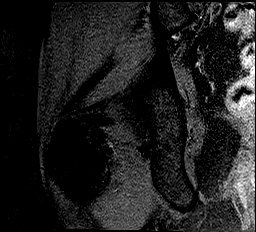
[im 12/23]
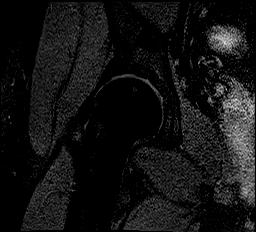
[im 17/23]
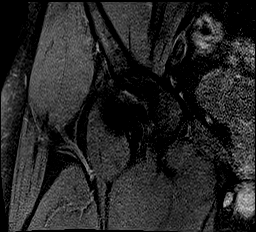
[im 23/23]
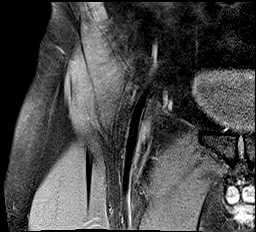

[40 of 40 positions shown; findings below may reference images not displayed]

FINDINGS: Bones: Small regions of chronic bilateral avascular necrosis of the
femoral heads, larger on the right than the left. No flattening or
surface morphology changes in the femoral heads. No surrounding
marrow edema to indicate active ischemic process. Mild type 1
degenerative endplate findings at L5-S1 with associated disc
desiccation.

Articular cartilage and labrum

Articular cartilage:  Unremarkable

Labrum:  Grossly unremarkable

Joint or bursal effusion

Joint effusion:  Absent

Bursae: No regional bursitis

Muscles and tendons

Muscles and tendons:  Unremarkable

Other findings

Miscellaneous:   No supplemental non-categorized findings.
IMPRESSION: 1. Small regions of chronic bilateral avascular necrosis in both
femoral heads, without marrow edema to suggest active process.
2. Degenerative disc disease and mild degenerative endplate findings
at L5-S1.

## 2020-08-26 NOTE — Progress Notes (Signed)
Needs appt

## 2020-08-30 ENCOUNTER — Ambulatory Visit (INDEPENDENT_AMBULATORY_CARE_PROVIDER_SITE_OTHER): Payer: No Typology Code available for payment source | Admitting: Orthopaedic Surgery

## 2020-08-30 ENCOUNTER — Other Ambulatory Visit: Payer: Self-pay

## 2020-08-30 ENCOUNTER — Encounter: Payer: Self-pay | Admitting: Orthopaedic Surgery

## 2020-08-30 DIAGNOSIS — M87051 Idiopathic aseptic necrosis of right femur: Secondary | ICD-10-CM

## 2020-08-30 NOTE — Progress Notes (Signed)
Office Visit Note   Patient: Shane Cole.           Date of Birth: [birth year]           MRN: [id number] Visit Date: 08/30/2020              Requested by: Shirline Frees, NP [address] [place],  [place] [address] PCP: Shirline Frees, NP   Assessment & Plan: Visit Diagnoses:  1. Avascular necrosis of hip, right (HCC)     Plan: MRI shows chronic bilateral avascular necrosis lesions.  There is no femoral head collapse or active edema.  These findings are consistent with Ficat Stage I.  I have recommended trying a cortisone injection in the hip joint which he is agreeable to.  He will get this set up with Dr. Prince Rome tomorrow.  We will see him back as needed.  Follow-Up Instructions: Return for needs appt Shane Cole tomorrow for right hip injection.   Orders:  No orders of the defined types were placed in this encounter.  No orders of the defined types were placed in this encounter.     Procedures: No procedures performed   Clinical Data: No additional findings.   Subjective: Chief Complaint  Patient presents with   Right Hip - Pain, Follow-up    Shane Cole returns today for MRI review of the right hip to evaluate for AVN.  He continues to have constant pain and at nighttime which is preventing him from sleeping.  He just got off work this morning.   Review of Systems   Objective: Vital Signs: There were no vitals taken for this visit.  Physical Exam  Ortho Exam Right hip exam is stable. Specialty Comments:  No specialty comments available.  Imaging: No results found.   PMFS History: Patient Active Problem List   Diagnosis Date Noted   Pharmacologic therapy 07/30/2020   Disorder of skeletal system 07/30/2020   Problems influencing health status 07/30/2020   Reflex sympathetic dystrophy of lower extremity (Left) 07/30/2020   Complex regional pain syndrome I of lower limb (Left) 07/30/2020   Avascular necrosis of hip (Right) (HCC) 07/30/2020    Chronic hip pain (Right) 07/30/2020   Chronic low back pain (2ry area of Pain) (Bilateral) w/o sciatica 07/30/2020   Chronic shoulder pain (3ry area of Pain) (Right) 07/30/2020   Auricular cyst 01/21/2018   Laryngopharyngeal reflux (LPR) 01/21/2018   GERD (gastroesophageal reflux disease) 07/16/2016   Internal hemorrhoid 07/16/2016   Abdominal pain, chronic, epigastric 07/16/2016   Rectal bleeding 07/16/2016   Essential hypertension 02/26/2016   Adhesive capsulitis of right shoulder 07/16/2015   Thoracic outlet syndrome 02/12/2015   Chronic narcotic use 10/26/2014   Opioid contract exists 10/26/2014   Smoker 05/11/2014   Coccygeal pain, acute 05/18/2012   Chronic pain syndrome 05/18/2012   Chronic diarrhea 03/29/2012   Bipolar disorder (HCC) 03/29/2012   Chronic foot pain (1ry area of Pain) (Left) 06/11/2011   Past Medical History:  Diagnosis Date   Allergy    Anxiety    Bipolar disorder (HCC)    Bronchitis    Crush injury lower leg    Left lower leg   Depression    Gastric ulcer    GERD (gastroesophageal reflux disease)    will awaken him in night   Hypertension    Migraine    Nerve pain    RSD (reflex sympathetic dystrophy)     Family History  Problem Relation Age of Onset   Hyperlipidemia  Father    Hypertension Father    Anxiety disorder Father    Drug abuse Father    Cancer Paternal Grandfather        lung, colon   Colon cancer Paternal Grandfather    Lung cancer Paternal Grandfather    Diabetes Paternal Grandmother    Cancer Paternal Grandmother    Cancer Maternal Grandmother    Cancer Maternal Grandfather    Lung cancer Maternal Grandfather    Stroke Other    Heart disease Other    Depression Paternal Aunt    Anxiety disorder Paternal Aunt    Drug abuse Paternal Uncle    Colon cancer Paternal Uncle    Esophageal cancer Neg Hx    Stomach cancer Neg Hx    Rectal cancer Neg Hx     Past Surgical History:  Procedure Laterality Date   CHOLECYSTECTOMY  N/A 01/24/2014   Procedure: LAPAROSCOPIC CHOLECYSTECTOMY;  Surgeon: Axel Filler, MD;  Location: [facility];  Service: General;  Laterality: N/A;   COLONOSCOPY     HAND SURGERY     MASS EXCISION Left 01/25/2013   Procedure: EXCISION MASS DORSAL ASPECT LEFT LONG FINGER DISTAL INTERPHALANGEAL JOINT;  Surgeon: Wyn Forster., MD;  Location: [facility];  Service: Orthopedics;  Laterality: Left;  Left long    MOUTH SURGERY     Social History   Occupational History   Occupation: Disabled    Comment: Previously worked Web designer  Tobacco Use   Smoking status: Every Day    Packs/day: 0.50    Years: 23.00    Pack years: 11.50    Types: Cigarettes, E-cigarettes   Smokeless tobacco: Never  Vaping Use   Vaping Use: Never used  Substance and Sexual Activity   Alcohol use: Yes    Alcohol/week: 6.0 standard drinks    Types: 6 Cans of beer per week    Comment: occasional   Drug use: No   Sexual activity: Yes    Partners: Female

## 2020-09-05 ENCOUNTER — Encounter
Admission: RE | Admit: 2020-09-05 | Discharge: 2020-09-05 | Disposition: A | Discharge status: Home / Self Care | Payer: No Typology Code available for payment source | Source: Ambulatory Visit | Attending: Pain Medicine | Admitting: Pain Medicine

## 2020-09-05 ENCOUNTER — Other Ambulatory Visit: Payer: Self-pay

## 2020-09-05 DIAGNOSIS — G8929 Other chronic pain: Secondary | ICD-10-CM | POA: Diagnosis present

## 2020-09-05 DIAGNOSIS — G90522 Complex regional pain syndrome I of left lower limb: Secondary | ICD-10-CM | POA: Diagnosis present

## 2020-09-05 DIAGNOSIS — M79672 Pain in left foot: Secondary | ICD-10-CM | POA: Insufficient documentation

## 2020-09-05 IMAGING — CT NM BONE 3 PHASE
1 series · 11 of 14 positions shown, 14 images · non-contrast
Comparison: None

Radiographic correlation: LEFT foot radiographs [DATE]

CLINICAL DATA: Crush injury to LEFT foot in [XM], subsequently
developed complex regional pain syndrome. Persistent LEFT foot pain
mostly at top of foot but radiates throughout entire foot

EXAM:
NUCLEAR MEDICINE 3-PHASE BONE SCAN
TECHNIQUE: Radionuclide angiographic images, immediate static blood pool
images, and 3-hour delayed static images were obtained of the feet
after intravenous injection of radiopharmaceutical. At the delayed
interval, CT imaging was also performed. Fused SPECT CT data sets
are reviewed in 3 planes
RADIOPHARMACEUTICALS:  22.13 mCi [XM] MDP IV

[Series 4: 3d bone 1.25 b70s · axial · 0.98mm/px · z∈[+408,+706]mm · 11 of 554 slices shown, 14 images]
[im 86/554  soft-tissue]
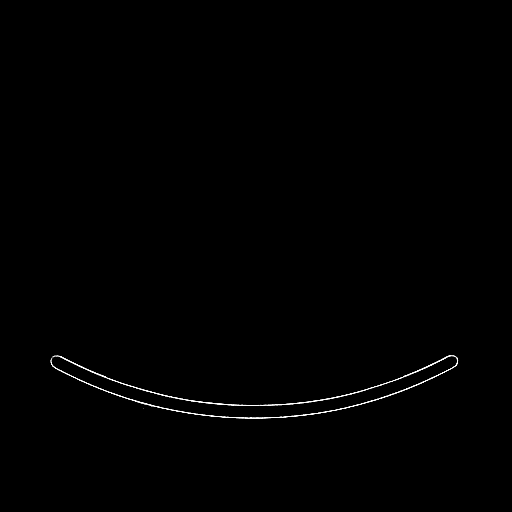
[im 86/554  bone]
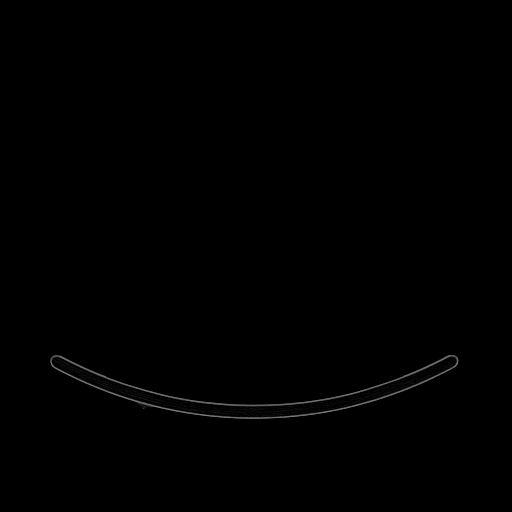
[im 128/554  bone]
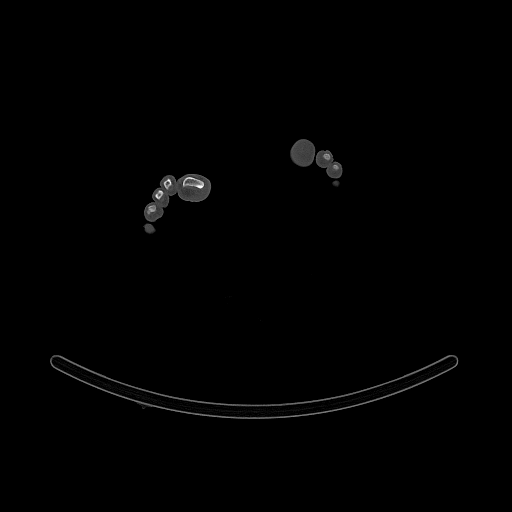
[im 171/554  bone]
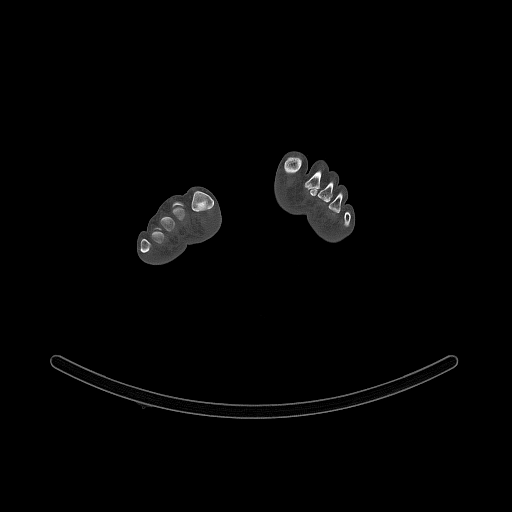
[im 213/554  bone]
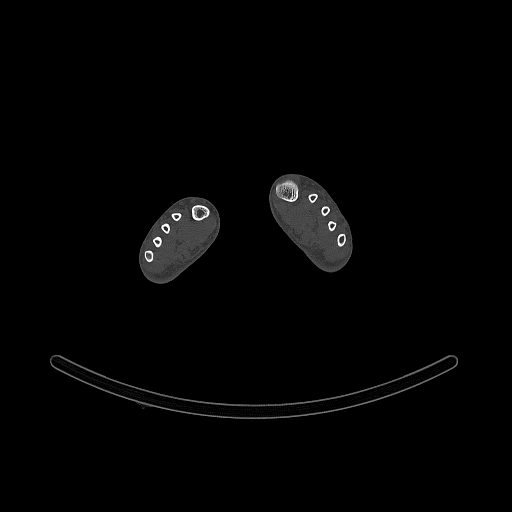
[im 256/554  soft-tissue]
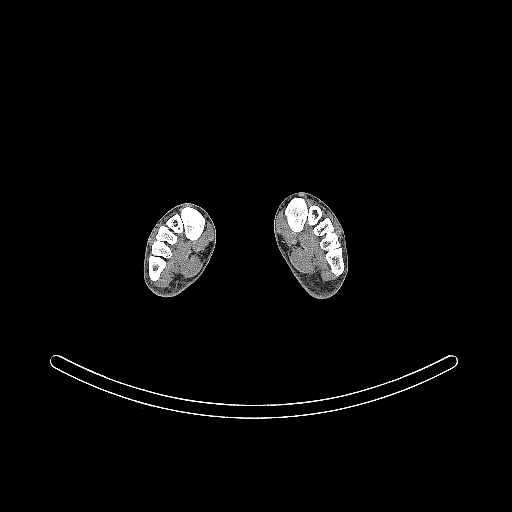
[im 256/554  bone]
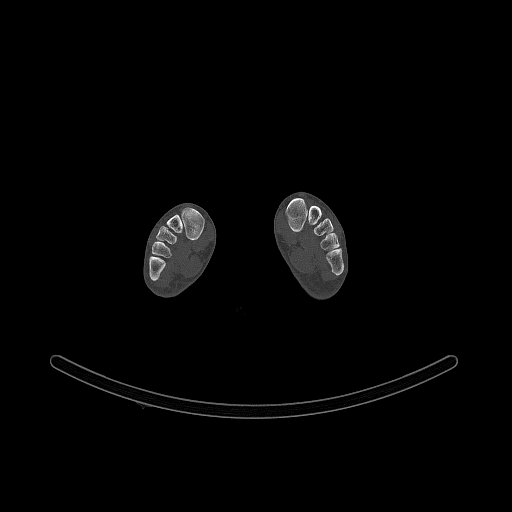
[im 298/554  bone]
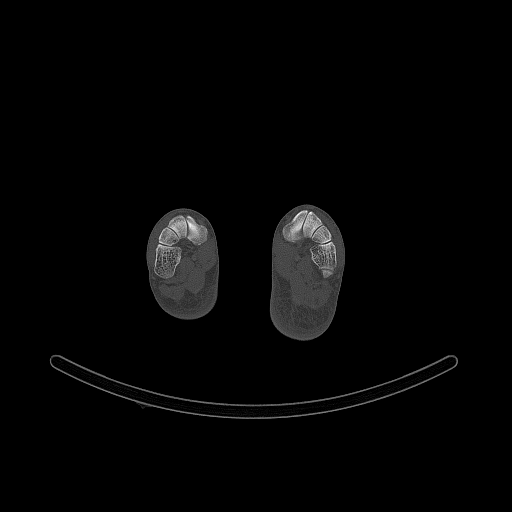
[im 341/554  bone]
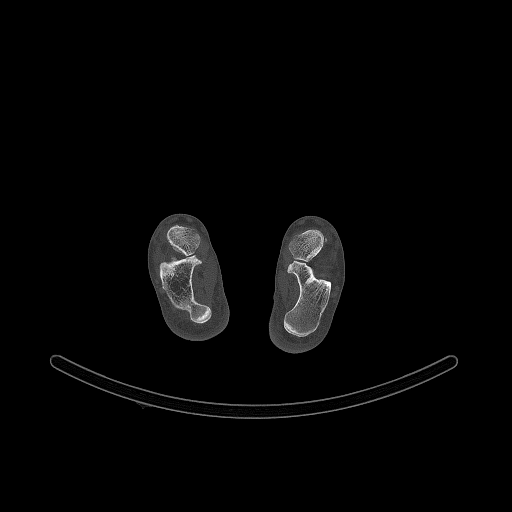
[im 383/554  bone]
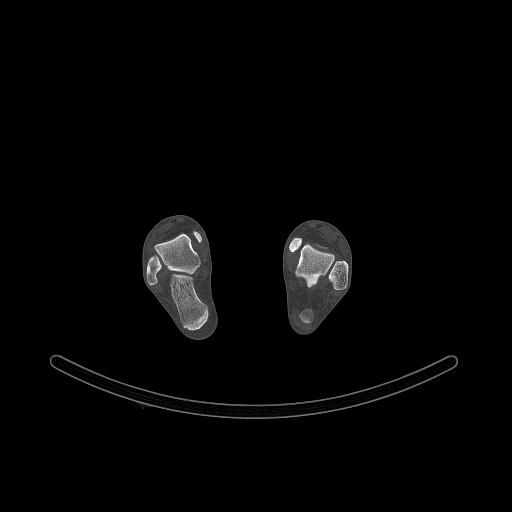
[im 426/554  soft-tissue]
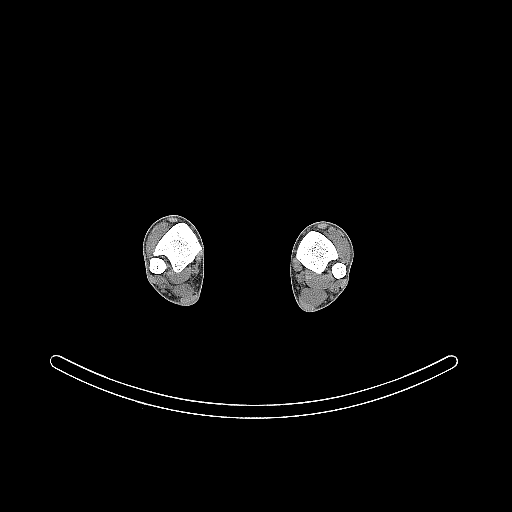
[im 426/554  bone]
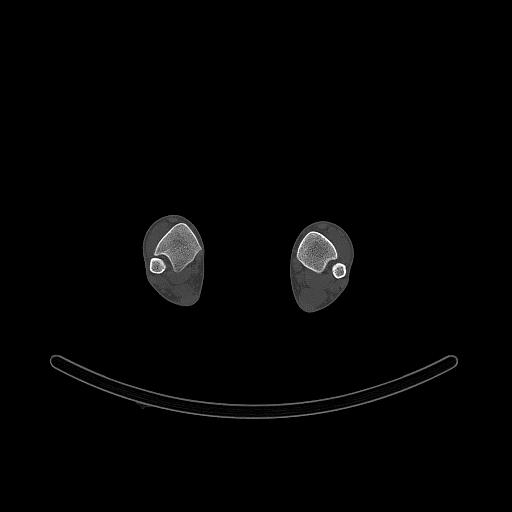
[im 468/554  bone]
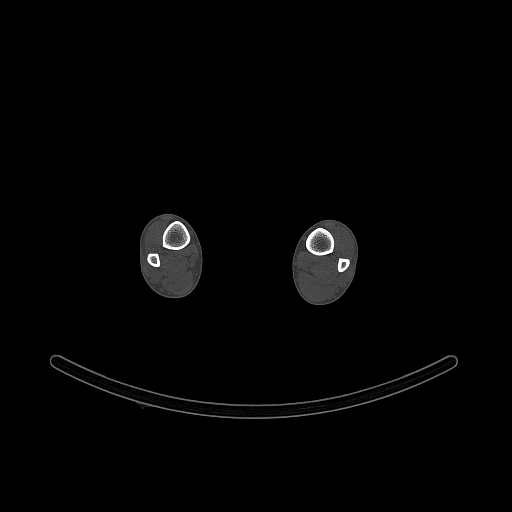
[im 511/554  bone]
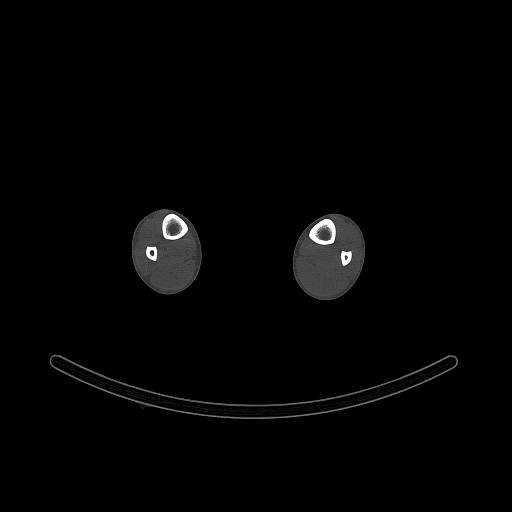

[11 of 14 positions shown; findings below may reference images not displayed]

FINDINGS: Vascular phase: Symmetric blood flow to both feet

Blood pool phase: Minimal focal increased blood pool at fifth MTP
joint. Otherwise symmetric blood pool at both feet.

Delayed phase: Focal uptake identified at the head of the fifth
metatarsal, could reflect stress injury/stress fracture or
incompletely healed fracture. Additional uptake is seen at the base
of the proximal phalanx second toe, corresponding to non union of
prior fracture, fragments appearing corticated. No additional sites
of abnormal tracer accumulation.
IMPRESSION: Uptake at head of LEFT fifth metatarsal question stress
fracture/stress response or less likely continued bone
turnover/healing at site of prior fracture.

Uptake at base of proximal phalanx LEFT second toe corresponding to
nonunion of prior fracture.

No evidence of the typical scintigraphic findings of complex
regional pain syndrome are identified.

## 2020-09-05 MED ORDER — TECHNETIUM TC 99M MEDRONATE IV KIT
20.0000 | PACK | Freq: Once | INTRAVENOUS | Status: AC | PRN
  Administered 2020-09-05: 22.13 via INTRAVENOUS

## 2020-09-07 ENCOUNTER — Ambulatory Visit (INDEPENDENT_AMBULATORY_CARE_PROVIDER_SITE_OTHER): Payer: No Typology Code available for payment source | Admitting: Family Medicine

## 2020-09-07 ENCOUNTER — Encounter: Payer: Self-pay | Admitting: Family Medicine

## 2020-09-07 ENCOUNTER — Ambulatory Visit: Payer: Self-pay

## 2020-09-07 ENCOUNTER — Other Ambulatory Visit: Payer: Self-pay

## 2020-09-07 DIAGNOSIS — M87051 Idiopathic aseptic necrosis of right femur: Secondary | ICD-10-CM

## 2020-09-07 NOTE — Progress Notes (Signed)
Office Visit Note   Patient: Shane Cole.           Date of Birth: [birth year]           MRN: [id number] Visit Date: 09/07/2020 Requested by: Shirline Frees, NP [address] [place],  [place] [address] PCP: Shirline Frees, NP  Subjective: Chief Complaint  Patient presents with   Right Hip - Pain    HPI: He is here for possible right hip injection.  He has bilateral avascular necrosis of the femoral heads.  Over the years he has had quite a bit of his corticosteroid, multiple injections in his spine and feet and other areas.  He wanted to talk about options before having an injection today.  He is concerned that doing a steroid injection is just putting more of what caused his problem in the first place back into the joint.                ROS:   All other systems were reviewed and are negative.  Objective: Vital Signs: There were no vitals taken for this visit.  Physical Exam:  General:  Alert and oriented, in no acute distress. Pulm:  Breathing unlabored. Psy:  Normal mood, congruent affect  Right hip: He does have quite a bit of pain with passive flexion and internal rotation.   Imaging: No results found.  Assessment & Plan: Bilateral hip AVN, right more symptomatic than left. -Elected not to inject with steroid today.  Future options could include PRP, or off label hyaluronic acid injection.     Procedures: No procedures performed        PMFS History: Patient Active Problem List   Diagnosis Date Noted   Pharmacologic therapy 07/30/2020   Disorder of skeletal system 07/30/2020   Problems influencing health status 07/30/2020   Reflex sympathetic dystrophy of lower extremity (Left) 07/30/2020   Complex regional pain syndrome I of lower limb (Left) 07/30/2020   Avascular necrosis of hip (Right) (HCC) 07/30/2020   Chronic hip pain (Right) 07/30/2020   Chronic low back pain (2ry area of Pain) (Bilateral) w/o sciatica 07/30/2020   Chronic shoulder  pain (3ry area of Pain) (Right) 07/30/2020   Auricular cyst 01/21/2018   Laryngopharyngeal reflux (LPR) 01/21/2018   GERD (gastroesophageal reflux disease) 07/16/2016   Internal hemorrhoid 07/16/2016   Abdominal pain, chronic, epigastric 07/16/2016   Rectal bleeding 07/16/2016   Essential hypertension 02/26/2016   Adhesive capsulitis of right shoulder 07/16/2015   Thoracic outlet syndrome 02/12/2015   Chronic narcotic use 10/26/2014   Opioid contract exists 10/26/2014   Smoker 05/11/2014   Coccygeal pain, acute 05/18/2012   Chronic pain syndrome 05/18/2012   Chronic diarrhea 03/29/2012   Bipolar disorder (HCC) 03/29/2012   Chronic foot pain (1ry area of Pain) (Left) 06/11/2011   Past Medical History:  Diagnosis Date   Allergy    Anxiety    Bipolar disorder (HCC)    Bronchitis    Crush injury lower leg    Left lower leg   Depression    Gastric ulcer    GERD (gastroesophageal reflux disease)    will awaken him in night   Hypertension    Migraine    Nerve pain    RSD (reflex sympathetic dystrophy)     Family History  Problem Relation Age of Onset   Hyperlipidemia Father    Hypertension Father    Anxiety disorder Father    Drug abuse Father    Cancer Paternal  Grandfather        lung, colon   Colon cancer Paternal Grandfather    Lung cancer Paternal Grandfather    Diabetes Paternal Grandmother    Cancer Paternal Grandmother    Cancer Maternal Grandmother    Cancer Maternal Grandfather    Lung cancer Maternal Grandfather    Stroke Other    Heart disease Other    Depression Paternal Aunt    Anxiety disorder Paternal Aunt    Drug abuse Paternal Uncle    Colon cancer Paternal Uncle    Esophageal cancer Neg Hx    Stomach cancer Neg Hx    Rectal cancer Neg Hx     Past Surgical History:  Procedure Laterality Date   CHOLECYSTECTOMY N/A 01/24/2014   Procedure: LAPAROSCOPIC CHOLECYSTECTOMY;  Surgeon: Axel Filler, MD;  Location: [facility];  Service: General;   Laterality: N/A;   COLONOSCOPY     HAND SURGERY     MASS EXCISION Left 01/25/2013   Procedure: EXCISION MASS DORSAL ASPECT LEFT LONG FINGER DISTAL INTERPHALANGEAL JOINT;  Surgeon: Wyn Forster., MD;  Location: [facility];  Service: Orthopedics;  Laterality: Left;  Left long    MOUTH SURGERY     Social History   Occupational History   Occupation: Disabled    Comment: Previously worked Web designer  Tobacco Use   Smoking status: Every Day    Packs/day: 0.50    Years: 23.00    Pack years: 11.50    Types: Cigarettes, E-cigarettes   Smokeless tobacco: Never  Vaping Use   Vaping Use: Never used  Substance and Sexual Activity   Alcohol use: Yes    Alcohol/week: 6.0 standard drinks    Types: 6 Cans of beer per week    Comment: occasional   Drug use: No   Sexual activity: Yes    Partners: Female

## 2020-09-10 ENCOUNTER — Ambulatory Visit: Payer: No Typology Code available for payment source | Admitting: Pain Medicine

## 2020-09-10 ENCOUNTER — Telehealth: Payer: Self-pay | Admitting: Adult Health

## 2020-09-10 NOTE — Telephone Encounter (Signed)
Noted. FYI 

## 2020-09-10 NOTE — Telephone Encounter (Signed)
Pt is calling in stating that the pain management ([facility]) has called him and r/d him from 09/17/2020 to 10/31/2020.  Pt would like to see if he can get scheduled w/a different pain management office.  Pt is aware that the provider is out of the office and stated that it is okay he will just wait until Kandee Keen comes back.

## 2020-09-17 ENCOUNTER — Ambulatory Visit: Payer: No Typology Code available for payment source | Admitting: Pain Medicine

## 2020-09-17 ENCOUNTER — Encounter: Payer: Self-pay | Admitting: Adult Health

## 2020-09-17 NOTE — Telephone Encounter (Signed)
FYI

## 2020-09-19 ENCOUNTER — Telehealth: Payer: Self-pay | Admitting: Adult Health

## 2020-09-19 ENCOUNTER — Other Ambulatory Visit (HOSPITAL_COMMUNITY): Payer: Self-pay

## 2020-09-19 MED ORDER — CARESTART COVID-19 HOME TEST VI KIT
PACK | 0 refills | Status: DC
  Filled 2020-09-19: qty 4, 4d supply, fill #0

## 2020-09-19 NOTE — Telephone Encounter (Signed)
  oxyCODONE (ROXICODONE) 5 MG immediate release tablet  [id number] - [place], [place] - [address] Phone:  [phone]  Fax:  [phone]

## 2020-09-20 NOTE — Telephone Encounter (Signed)
FYI

## 2020-09-26 ENCOUNTER — Other Ambulatory Visit (HOSPITAL_COMMUNITY): Payer: Self-pay

## 2020-09-26 ENCOUNTER — Ambulatory Visit (INDEPENDENT_AMBULATORY_CARE_PROVIDER_SITE_OTHER): Payer: No Typology Code available for payment source | Admitting: Adult Health

## 2020-09-26 ENCOUNTER — Encounter: Payer: Self-pay | Admitting: Adult Health

## 2020-09-26 VITALS — Temp 98.4°F | Ht 71.0 in | Wt 210.0 lb

## 2020-09-26 DIAGNOSIS — M8700 Idiopathic aseptic necrosis of unspecified bone: Secondary | ICD-10-CM | POA: Diagnosis not present

## 2020-09-26 DIAGNOSIS — G90529 Complex regional pain syndrome I of unspecified lower limb: Secondary | ICD-10-CM

## 2020-09-26 MED ORDER — LIDOCAINE 5 % EX OINT
1.0000 "application " | TOPICAL_OINTMENT | CUTANEOUS | 2 refills | Status: DC | PRN
  Filled 2020-09-26: qty 50, 30d supply, fill #0
  Filled 2021-01-01: qty 50, 30d supply, fill #1
  Filled 2021-03-28: qty 50, 30d supply, fill #2

## 2020-09-26 MED ORDER — IBUPROFEN 800 MG PO TABS
800.0000 mg | ORAL_TABLET | Freq: Three times a day (TID) | ORAL | 1 refills | Status: DC
  Filled 2020-09-26: qty 270, 90d supply, fill #0

## 2020-09-26 MED ORDER — OXYCODONE HCL 10 MG PO TABS
10.0000 mg | ORAL_TABLET | Freq: Three times a day (TID) | ORAL | 0 refills | Status: DC
  Filled 2020-09-26: qty 90, 30d supply, fill #0

## 2020-09-26 NOTE — Progress Notes (Addendum)
Virtual Visit via Video Note  I connected with Shane Cole on 76/28/31 at  2:00 PM EDT by a video enabled telemedicine application and verified that I am speaking with the correct person using two identifiers.  Location patient: home Location provider:work or home office Persons participating in the virtual visit: patient, provider  I discussed the limitations of evaluation and management by telemedicine and the availability of in person appointments. The patient expressed understanding and agreed to proceed.   HPI:  40 year old male who  has a past medical history of Allergy, Anxiety, Bipolar disorder ([place]), Bronchitis, Crush injury lower leg, Depression, Gastric ulcer, GERD (gastroesophageal reflux disease), Hypertension, Migraine, Nerve pain, and RSD (reflex sympathetic dystrophy).  He is being evaluated today for pain management. This visit was supposed to be in person but he tested positive for COVID 19.   He was referred to pain management ( prior pain management was no longer in network) had his initial appointment at [facility] pain management clinic back in May 2022.  He had a crush injury to the left foot in 2005 and subsequently developed complex regional pain syndrome.  Persistent left foot pain mostly on the top of the foot but radiates throughout the entire foot.  He did have a nuclear medicine bone scan done of the lower extremity, this was ordered by pain management.  Unfortunately his follow-up appointment was changed and rescheduled for August 10.  He has ultimately also been diagnosed with avascular necrosis of his right hip.  He has been seen by Dr. Junius Roads for this.  He has been out of his pain medication, oxycodone for the last few weeks.  He has been using a cane to help ease some of the pressure off his hip, but this bothers his foot "quite a bit. Has fallen multiple times due to shooting pain from his hips.   Current pain regiment medication  regimen includes oxycodone 5 mg 1 tablet every 6 hours as needed for pain, gabapentin 600 mg 3 times daily, Motrin 800 mg 3 times daily as needed, Tylenol 1000 to 1500 mg every 6 hours as needed, and nortriptyline 75 mg nightly.  He has not been able to work for the last two weeks due to the amount of pain he has been experiencing. Pain seems to be getting worse every day.   ROS: See pertinent positives and negatives per HPI.  Past Medical History:  Diagnosis Date   Allergy    Anxiety    Bipolar disorder ([place])    Bronchitis    Crush injury lower leg    Left lower leg   Depression    Gastric ulcer    GERD (gastroesophageal reflux disease)    will awaken him in night   Hypertension    Migraine    Nerve pain    RSD (reflex sympathetic dystrophy)     Past Surgical History:  Procedure Laterality Date   CHOLECYSTECTOMY N/A 01/24/2014   Procedure: LAPAROSCOPIC CHOLECYSTECTOMY;  Surgeon: Ralene Ok, MD;  Location: [facility];  Service: General;  Laterality: N/A;   COLONOSCOPY     HAND SURGERY     MASS EXCISION Left 01/25/2013   Procedure: EXCISION MASS DORSAL ASPECT LEFT LONG FINGER DISTAL INTERPHALANGEAL JOINT;  Surgeon: Cammie Sickle., MD;  Location: [facility];  Service: Orthopedics;  Laterality: Left;  Left long    MOUTH SURGERY      Family History  Problem Relation Age of Onset  Hyperlipidemia Father    Hypertension Father    Anxiety disorder Father    Drug abuse Father    Cancer Paternal Grandfather        lung, colon   Colon cancer Paternal Grandfather    Lung cancer Paternal Grandfather    Diabetes Paternal Grandmother    Cancer Paternal Grandmother    Cancer Maternal Grandmother    Cancer Maternal Grandfather    Lung cancer Maternal Grandfather    Stroke Other    Heart disease Other    Depression Paternal Aunt    Anxiety disorder Paternal Aunt    Drug abuse Paternal Uncle    Colon cancer Paternal Uncle    Esophageal cancer Neg Hx     Stomach cancer Neg Hx    Rectal cancer Neg Hx        Current Outpatient Medications:    COVID-19 At Home Antigen Test (CARESTART COVID-19 HOME TEST) KIT, Use as directed, Disp: 4 each, Rfl: 0   famotidine (PEPCID) 20 MG tablet, Take 20 mg by mouth 2 (two) times daily., Disp: , Rfl:    gabapentin (NEURONTIN) 300 MG capsule, Take 1 capsule (300 mg total) by mouth 3 (three) times daily. (Patient taking differently: Take 600 mg by mouth 3 (three) times daily.), Disp: 270 capsule, Rfl: 3   lidocaine (LIDODERM) 5 %, Place 1 patch onto the skin daily. Remove & Discard patch within 12 hours or as directed by MD, Disp: , Rfl:    lidocaine (LINDAMANTLE) 3 % CREA cream, Apply 1 application topically daily as needed (chronic pain)., Disp: 28.35 g, Rfl: 1   lidocaine (XYLOCAINE) 5 % ointment, Apply 1 application topically as needed., Disp: , Rfl:    Lidocaine 4 % PTCH, Apply topically., Disp: , Rfl:    Multiple Vitamin (MULTIVITAMIN WITH MINERALS) TABS tablet, Take 1 tablet by mouth daily., Disp: , Rfl:    Nerve Stimulator (TENS THERAPY PAIN RELIEF) DEVI, 1 application by Does not apply route daily as needed (chronic pain)., Disp: , Rfl:    nortriptyline (PAMELOR) 75 MG capsule, Take 1 capsule (75 mg total) by mouth at bedtime., Disp: 90 capsule, Rfl: 1   ondansetron (ZOFRAN ODT) 4 MG disintegrating tablet, Dissolve 1 tablet (4 mg total) by mouth every 8 (eight) hours as needed for nausea or vomiting., Disp: 20 tablet, Rfl: 2   pantoprazole (PROTONIX) 40 MG tablet, Take 1 tablet twice daily 30 to 60 minutes before breakfast and dinner., Disp: 60 tablet, Rfl: 5   sildenafil (REVATIO) 20 MG tablet, TAKE 1 TABLET BY MOUTH 2 TIMES DAILY, Disp: 180 tablet, Rfl: 1   tiZANidine (ZANAFLEX) 4 MG tablet, Take 1 tablet by mouth 3 (three) times daily as needed., Disp: , Rfl: 6  EXAM:  VITALS per patient if applicable:  GENERAL: alert, oriented, appears well and in no acute distress  HEENT: atraumatic,  conjunttiva clear, no obvious abnormalities on inspection of external nose and ears  NECK: normal movements of the head and neck  LUNGS: on inspection no signs of respiratory distress, breathing rate appears normal, no obvious gross SOB, gasping or wheezing  CV: no obvious cyanosis  MS: moves all visible extremities without noticeable abnormality  PSYCH/NEURO: pleasant and cooperative, no obvious depression or anxiety, speech and thought processing grossly intact  ASSESSMENT AND PLAN:  Discussed the following assessment and plan:  1. Complex regional pain syndrome type 1 of lower extremity, unspecified laterality Will increase oxycodone to 10 mg TID x 1 month. Will manage  him until he is seen by Pain management. Follow up in one month. Controlled substance database reviewed and no red flags noted.  - oxyCODONE 10 MG TABS; Take 1 tablet (10 mg total) by mouth every 8 (eight) hours.  Dispense: 90 tablet; Refill: 0 - lidocaine (XYLOCAINE) 5 % ointment; Apply 1 application topically as needed for moderate pain.  Dispense: 50 g; Refill: 2 - ibuprofen (ADVIL) 800 MG tablet; Take 1 tablet (800 mg total) by mouth every 8 (eight) hours.  Dispense: 270 tablet; Refill: 1  2. Avascular necrosis ([place]) - Follow up with orthopedic a directed - oxyCODONE 10 MG TABS; Take 1 tablet (10 mg total) by mouth every 8 (eight) hours.  Dispense: 90 tablet; Refill: 0 - lidocaine (XYLOCAINE) 5 % ointment; Apply 1 application topically as needed for moderate pain.  Dispense: 50 g; Refill: 2 - ibuprofen (ADVIL) 800 MG tablet; Take 1 tablet (800 mg total) by mouth every 8 (eight) hours.  Dispense: 270 tablet; Refill: 1     I discussed the assessment and treatment plan with the patient. The patient was provided an opportunity to ask questions and all were answered. The patient agreed with the plan and demonstrated an understanding of the instructions.   The patient was advised to call back or seek an in-person  evaluation if the symptoms worsen or if the condition fails to improve as anticipated.   Dorothyann Peng, NP

## 2020-09-27 ENCOUNTER — Other Ambulatory Visit (HOSPITAL_COMMUNITY): Payer: Self-pay

## 2020-10-01 ENCOUNTER — Other Ambulatory Visit (HOSPITAL_COMMUNITY): Payer: Self-pay

## 2020-10-16 ENCOUNTER — Telehealth: Payer: Self-pay

## 2020-10-16 ENCOUNTER — Ambulatory Visit (INDEPENDENT_AMBULATORY_CARE_PROVIDER_SITE_OTHER): Payer: No Typology Code available for payment source | Admitting: Orthopaedic Surgery

## 2020-10-16 ENCOUNTER — Ambulatory Visit (INDEPENDENT_AMBULATORY_CARE_PROVIDER_SITE_OTHER): Payer: No Typology Code available for payment source

## 2020-10-16 ENCOUNTER — Encounter: Payer: Self-pay | Admitting: Orthopaedic Surgery

## 2020-10-16 ENCOUNTER — Other Ambulatory Visit: Payer: Self-pay

## 2020-10-16 VITALS — Ht 71.0 in | Wt 210.0 lb

## 2020-10-16 DIAGNOSIS — M87051 Idiopathic aseptic necrosis of right femur: Secondary | ICD-10-CM | POA: Diagnosis not present

## 2020-10-16 NOTE — Telephone Encounter (Signed)
Tried to call patient. No answer. Left message that Dr.Xu would like him to come back to the office. He wants patient to have an x-ray of his pelvis done and then patient can leave. Left our number if he has any questions.

## 2020-10-16 NOTE — Progress Notes (Signed)
Office Visit Note   Patient: Shane Cole.           Date of Birth: [birth year]           MRN: [id number] Visit Date: 10/16/2020              Requested by: Shirline Frees, NP [address] [place],  [place] [address] PCP: Shirline Frees, NP   Assessment & Plan: Visit Diagnoses:  1. Avascular necrosis of hip, right (HCC)     Plan: Impression is right hip avascular necrosis.  Unfortunately conservative management has not provide him with relief and he is not interested in cortisone injection as that can worsen the underlying problem.  He based on his options he has elected to move forward with a right total hip replacement soon as possible.  Risks including infection, incomplete relief of pain, dislocation, leg length discrepancy, DVT reviewed in detail today.  Rehab recovery time also discussed.  Questions encouraged and answered.  Follow-Up Instructions: Return for postop.   Orders:  Orders Placed This Encounter  Procedures   XR Pelvis 1-2 Views   No orders of the defined types were placed in this encounter.     Procedures: No procedures performed   Clinical Data: No additional findings.   Subjective: Chief Complaint  Patient presents with   Right Hip - Follow-up    AVN    Shane Cole returns today for follow-up of right hip avascular necrosis.  He elected not to undergo cortisone injection back in June.  He continues to have severe and intense pain.  He is now having to ambulate with a cane.  He is unable to return back to work because of the pain.  He has constant pain at night as well.  He does have a prior history of chronic pain syndrome and he has an appointment with a pain clinic in [place] in the near future.   Review of Systems  Constitutional: Negative.   All other systems reviewed and are negative.   Objective: Vital Signs: Ht 5\' 11"  (1.803 m)   Wt 210 lb (95.3 kg)   BMI 29.29 kg/m   Physical Exam Vitals and nursing note reviewed.   Constitutional:      Appearance: He is well-developed.  Pulmonary:     Effort: Pulmonary effort is normal.  Abdominal:     Palpations: Abdomen is soft.  Skin:    General: Skin is warm.  Neurological:     Mental Status: He is alert and oriented to person, place, and time.  Psychiatric:        Behavior: Behavior normal.        Thought Content: Thought content normal.        Judgment: Judgment normal.    Ortho Exam Right hip exam shows pain with range of motion.  Significant antalgic gait with ambulation.  Decreased range of motion secondary to pain and guarding. Specialty Comments:  No specialty comments available.  Imaging: XR Pelvis 1-2 Views  Result Date: 10/16/2020 Patchy isolated sclerotic lesions of the right femoral head consistent with avascular necrosis.    PMFS History: Patient Active Problem List   Diagnosis Date Noted   Pharmacologic therapy 07/30/2020   Disorder of skeletal system 07/30/2020   Problems influencing health status 07/30/2020   Reflex sympathetic dystrophy of lower extremity (Left) 07/30/2020   Complex regional pain syndrome I of lower limb (Left) 07/30/2020   Avascular necrosis of hip (Right) (HCC) 07/30/2020   Chronic hip  pain (Right) 07/30/2020   Chronic low back pain (2ry area of Pain) (Bilateral) w/o sciatica 07/30/2020   Chronic shoulder pain (3ry area of Pain) (Right) 07/30/2020   Auricular cyst 01/21/2018   Laryngopharyngeal reflux (LPR) 01/21/2018   GERD (gastroesophageal reflux disease) 07/16/2016   Internal hemorrhoid 07/16/2016   Abdominal pain, chronic, epigastric 07/16/2016   Rectal bleeding 07/16/2016   Essential hypertension 02/26/2016   Adhesive capsulitis of right shoulder 07/16/2015   Thoracic outlet syndrome 02/12/2015   Chronic narcotic use 10/26/2014   Opioid contract exists 10/26/2014   Smoker 05/11/2014   Coccygeal pain, acute 05/18/2012   Chronic pain syndrome 05/18/2012   Chronic diarrhea 03/29/2012   Bipolar  disorder (HCC) 03/29/2012   Chronic foot pain (1ry area of Pain) (Left) 06/11/2011   Past Medical History:  Diagnosis Date   Allergy    Anxiety    Bipolar disorder (HCC)    Bronchitis    Crush injury lower leg    Left lower leg   Depression    Gastric ulcer    GERD (gastroesophageal reflux disease)    will awaken him in night   Hypertension    Migraine    Nerve pain    RSD (reflex sympathetic dystrophy)     Family History  Problem Relation Age of Onset   Hyperlipidemia Father    Hypertension Father    Anxiety disorder Father    Drug abuse Father    Cancer Paternal Grandfather        lung, colon   Colon cancer Paternal Grandfather    Lung cancer Paternal Grandfather    Diabetes Paternal Grandmother    Cancer Paternal Grandmother    Cancer Maternal Grandmother    Cancer Maternal Grandfather    Lung cancer Maternal Grandfather    Stroke Other    Heart disease Other    Depression Paternal Aunt    Anxiety disorder Paternal Aunt    Drug abuse Paternal Uncle    Colon cancer Paternal Uncle    Esophageal cancer Neg Hx    Stomach cancer Neg Hx    Rectal cancer Neg Hx     Past Surgical History:  Procedure Laterality Date   CHOLECYSTECTOMY N/A 01/24/2014   Procedure: LAPAROSCOPIC CHOLECYSTECTOMY;  Surgeon: Axel Filler, MD;  Location: [facility];  Service: General;  Laterality: N/A;   COLONOSCOPY     HAND SURGERY     MASS EXCISION Left 01/25/2013   Procedure: EXCISION MASS DORSAL ASPECT LEFT LONG FINGER DISTAL INTERPHALANGEAL JOINT;  Surgeon: Wyn Forster., MD;  Location: [facility];  Service: Orthopedics;  Laterality: Left;  Left long    MOUTH SURGERY     Social History   Occupational History   Occupation: Disabled    Comment: Previously worked Web designer  Tobacco Use   Smoking status: Every Day    Packs/day: 0.50    Years: 23.00    Pack years: 11.50    Types: Cigarettes, E-cigarettes   Smokeless tobacco: Never  Vaping Use    Vaping Use: Never used  Substance and Sexual Activity   Alcohol use: Yes    Alcohol/week: 6.0 standard drinks    Types: 6 Cans of beer per week    Comment: occasional   Drug use: No   Sexual activity: Yes    Partners: Female

## 2020-10-29 ENCOUNTER — Other Ambulatory Visit (HOSPITAL_COMMUNITY): Payer: Self-pay

## 2020-10-29 ENCOUNTER — Other Ambulatory Visit: Payer: Self-pay | Admitting: Adult Health

## 2020-10-29 DIAGNOSIS — M8700 Idiopathic aseptic necrosis of unspecified bone: Secondary | ICD-10-CM

## 2020-10-29 DIAGNOSIS — G90529 Complex regional pain syndrome I of unspecified lower limb: Secondary | ICD-10-CM

## 2020-10-30 ENCOUNTER — Other Ambulatory Visit (HOSPITAL_COMMUNITY): Payer: Self-pay

## 2020-10-30 ENCOUNTER — Other Ambulatory Visit: Payer: Self-pay

## 2020-10-30 MED ORDER — OXYCODONE HCL 10 MG PO TABS
10.0000 mg | ORAL_TABLET | Freq: Three times a day (TID) | ORAL | 0 refills | Status: DC
  Filled 2020-10-30: qty 42, 14d supply, fill #0

## 2020-10-31 ENCOUNTER — Ambulatory Visit (INDEPENDENT_AMBULATORY_CARE_PROVIDER_SITE_OTHER): Payer: No Typology Code available for payment source | Admitting: Adult Health

## 2020-10-31 ENCOUNTER — Ambulatory Visit: Payer: No Typology Code available for payment source | Admitting: Pain Medicine

## 2020-10-31 ENCOUNTER — Encounter: Payer: Self-pay | Admitting: Adult Health

## 2020-10-31 ENCOUNTER — Other Ambulatory Visit (HOSPITAL_COMMUNITY): Payer: Self-pay

## 2020-10-31 VITALS — BP 120/70 | HR 96 | Temp 99.2°F | Ht 71.0 in | Wt 220.0 lb

## 2020-10-31 DIAGNOSIS — M8700 Idiopathic aseptic necrosis of unspecified bone: Secondary | ICD-10-CM

## 2020-10-31 DIAGNOSIS — G90529 Complex regional pain syndrome I of unspecified lower limb: Secondary | ICD-10-CM | POA: Diagnosis not present

## 2020-10-31 DIAGNOSIS — R52 Pain, unspecified: Secondary | ICD-10-CM | POA: Diagnosis not present

## 2020-10-31 NOTE — Progress Notes (Signed)
Subjective:    Patient ID: Shane Shove., male    DOB: 1981-01-15, 40 y.o.   MRN: [id number]  HPI [place] reviewed in Epic  He has an appointment with his new pain management provider in October and scheduled total hip in September. We will manage his chronic pain until he is seen by Pain Management    Indication for chronic opioid: Complex regional pain syndrome of lower extremity & Avascular necrosis of right hip Medication and dose: Oxycodone 10 mg TID, Gabapentin 600 mg TID, Nortiptyline 75 mg nightly, Motrin 800 mg TID and Tylenol 1000 mg Q6H PRN.  # pills per month: 90 tabs of Oxycodone  Last UDS date: 10/31/2020 Opioid Treatment Agreement signed: 10/31/2020 Opioid Treatment Agreement last reviewed with patient: 10/31/2020 [facility] reviewed this encounter (include red flags):  Reviewed and no red flags.   Review of Systems See HPI   Past Medical History:  Diagnosis Date   Allergy    Anxiety    Bipolar disorder ([place])    Bronchitis    Crush injury lower leg    Left lower leg   Depression    Gastric ulcer    GERD (gastroesophageal reflux disease)    will awaken him in night   Hypertension    Migraine    Nerve pain    RSD (reflex sympathetic dystrophy)     Social History   Socioeconomic History   Marital status: Married    Spouse name: Not on file   Number of children: 1   Years of education: 10th grade   Highest education level: Not on file  Occupational History   Occupation: Disabled    Comment: Previously worked Medical sales representative  Tobacco Use   Smoking status: Every Day    Packs/day: 0.50    Years: 23.00    Pack years: 11.50    Types: Cigarettes, E-cigarettes   Smokeless tobacco: Never  Vaping Use   Vaping Use: Never used  Substance and Sexual Activity   Alcohol use: Yes    Alcohol/week: 6.0 standard drinks    Types: 6 Cans of beer per week    Comment: occasional   Drug use: No   Sexual activity: Yes    Partners: Female  Other  Topics Concern   Not on file  Social History Narrative   05/24/12  Jaquane was born and grew up in [place], [place].    He has 2 sisters.    He reports that his childhood was "lousy."    He completed the 10th grade.    He has been married for 5 years, and is currently separated for 3 weeks.    He has a daughter who is 49-1/2 years old.    He has been unemployed for 2 years, and is disabled due to an on-the-job work accident.    He is currently living with his aunt and cousin.    He reports that his hobbies are sports and dogs.    He reports that he is spiritual, but not religious.    He states that his aunt and his father are his social support system.    He denies any current legal problems, but got a DUI in 2007. 05/24/12 [facility]      Social Determinants of Health   Financial Resource Strain: Not on file  Food Insecurity: Not on file  Transportation Needs: Not on file  Physical Activity: Not on file  Stress: Not on file  Social Connections:  Not on file  Intimate Partner Violence: Not on file    Past Surgical History:  Procedure Laterality Date   CHOLECYSTECTOMY N/A 01/24/2014   Procedure: LAPAROSCOPIC CHOLECYSTECTOMY;  Surgeon: Ralene Ok, MD;  Location: [facility];  Service: General;  Laterality: N/A;   COLONOSCOPY     HAND SURGERY     MASS EXCISION Left 01/25/2013   Procedure: EXCISION MASS DORSAL ASPECT LEFT LONG FINGER DISTAL INTERPHALANGEAL JOINT;  Surgeon: Cammie Sickle., MD;  Location: [facility];  Service: Orthopedics;  Laterality: Left;  Left long    MOUTH SURGERY      Family History  Problem Relation Age of Onset   Hyperlipidemia Father    Hypertension Father    Anxiety disorder Father    Drug abuse Father    Cancer Paternal Grandfather        lung, colon   Colon cancer Paternal Grandfather    Lung cancer Paternal Grandfather    Diabetes Paternal Grandmother    Cancer Paternal Grandmother    Cancer Maternal Grandmother    Cancer Maternal  Grandfather    Lung cancer Maternal Grandfather    Stroke Other    Heart disease Other    Depression Paternal Aunt    Anxiety disorder Paternal Aunt    Drug abuse Paternal Uncle    Colon cancer Paternal Uncle    Esophageal cancer Neg Hx    Stomach cancer Neg Hx    Rectal cancer Neg Hx     Allergies  Allergen Reactions   Aspirin Other (See Comments) and Tinitus    Tinnitus, dizzy, short of breath   Codeine Itching    Reaction to tylenol #3   Penicillins Anaphylaxis and Other (See Comments)    Was told never to take medication.  Has patient had a PCN reaction causing immediate rash, facial/tongue/throat swelling, SOB or lightheadedness with hypotension: no Has patient had a PCN reaction causing severe rash involving mucus membranes or skin necrosis: no Has patient had a PCN reaction that required hospitalization: no Has patient had a PCN reaction occurring within the last 10 years: no If all of the above answers are "NO", then may proceed with Cephalosporin use.    Tramadol     seizure Other reaction(s): Seizures   Amitriptyline Rash    Current Outpatient Medications on File Prior to Visit  Medication Sig Dispense Refill   COVID-19 At Home Antigen Test (CARESTART COVID-19 HOME TEST) KIT Use as directed 4 each 0   famotidine (PEPCID) 20 MG tablet Take 20 mg by mouth 2 (two) times daily.     gabapentin (NEURONTIN) 300 MG capsule Take 1 capsule (300 mg total) by mouth 3 (three) times daily. (Patient taking differently: Take 600 mg by mouth 3 (three) times daily.) 270 capsule 3   ibuprofen (ADVIL) 800 MG tablet Take 1 tablet (800 mg total) by mouth every 8 (eight) hours. 270 tablet 1   lidocaine (LIDODERM) 5 % Place 1 patch onto the skin daily. Remove & Discard patch within 12 hours or as directed by MD     lidocaine (XYLOCAINE) 5 % ointment Apply 1 application topically as needed for moderate pain. 50 g 2   Multiple Vitamin (MULTIVITAMIN WITH MINERALS) TABS tablet Take 1 tablet by  mouth daily.     Nerve Stimulator (TENS THERAPY PAIN RELIEF) DEVI 1 application by Does not apply route daily as needed (chronic pain).     nortriptyline (PAMELOR) 75 MG capsule Take 1 capsule (75  mg total) by mouth at bedtime. 90 capsule 1   ondansetron (ZOFRAN ODT) 4 MG disintegrating tablet Dissolve 1 tablet (4 mg total) by mouth every 8 (eight) hours as needed for nausea or vomiting. 20 tablet 2   Oxycodone HCl 10 MG TABS Take 1 tablet (10 mg total) by mouth every 8 (eight) hours for 14 days. 42 tablet 0   pantoprazole (PROTONIX) 40 MG tablet Take 1 tablet twice daily 30 to 60 minutes before breakfast and dinner. 60 tablet 5   sildenafil (REVATIO) 20 MG tablet TAKE 1 TABLET BY MOUTH 2 TIMES DAILY 180 tablet 1   No current facility-administered medications on file prior to visit.    BP 120/70   Pulse 96   Temp 99.2 F (37.3 C) (Oral)   Ht _0  (1.803 m)   Wt 220 lb (99.8 kg)   SpO2 97%   BMI 30.68 kg/m       Objective:   Physical Exam Vitals and nursing note reviewed.  Constitutional:      Appearance: Normal appearance.  Cardiovascular:     Rate and Rhythm: Normal rate and regular rhythm.     Pulses: Normal pulses.     Heart sounds: Normal heart sounds.  Pulmonary:     Effort: Pulmonary effort is normal.     Breath sounds: Normal breath sounds.  Skin:    General: Skin is warm and dry.  Neurological:     General: No focal deficit present.     Mental Status: He is alert and oriented to person, place, and time.     Motor: Weakness present.     Gait: Gait abnormal.     Comments: Walks with single prong cane   Psychiatric:        Mood and Affect: Mood normal.        Behavior: Behavior normal.        Thought Content: Thought content normal.        Judgment: Judgment normal.      Assessment & Plan:  1. Pain management - Pain contract signed.  - No red flags noted.  - DRUG MONITOR, PANEL 1, W/CONF, W/DL, URINE  2. Complex regional pain syndrome type 1 of lower  extremity, unspecified laterality  - DRUG MONITOR, PANEL 1, W/CONF, W/DL, URINE  3. Avascular necrosis ([place])  - DRUG MONITOR, PANEL 1, W/CONF, W/DL, URINE  Dorothyann Peng, NP

## 2020-11-03 LAB — DRUG MONITOR, PANEL 1, W/CONF, W/DL, URINE
Amphetamines: NEGATIVE ng/mL (ref <500)
Barbiturates: NEGATIVE ng/mL (ref <300)
Benzodiazepines: NEGATIVE ng/mL (ref <100)
Cocaine Metabolite: NEGATIVE ng/mL (ref <150)
Codeine: NEGATIVE ng/mL (ref <50)
Creatinine: 210.1 mg/dL (ref >=20.0)
EDDP: NEGATIVE ng/mL (ref <100)
Hydrocodone: NEGATIVE ng/mL (ref <50)
Hydromorphone: NEGATIVE ng/mL (ref <50)
Marijuana Metabolite: NEGATIVE ng/mL (ref <20)
Methadone Metabolite: NEGATIVE ng/mL (ref <100)
Methadone: NEGATIVE ng/mL (ref <100)
Morphine: NEGATIVE ng/mL (ref <50)
Norhydrocodone: NEGATIVE ng/mL (ref <50)
Noroxycodone: >10000 ng/mL — ABNORMAL HIGH (ref <50)
Opiates: NEGATIVE ng/mL (ref <100)
Oxidant: NEGATIVE ug/mL (ref <200)
Oxycodone: >10000 ng/mL — ABNORMAL HIGH (ref <50)
Oxycodone: POSITIVE ng/mL — AB (ref <100)
Oxymorphone: 8812 ng/mL — ABNORMAL HIGH (ref <50)
Phencyclidine: NEGATIVE ng/mL (ref <25)
pH: 5.8 (ref 4.5–9.0)

## 2020-11-03 LAB — DM TEMPLATE

## 2020-11-08 ENCOUNTER — Other Ambulatory Visit (HOSPITAL_COMMUNITY): Payer: Self-pay

## 2020-11-12 ENCOUNTER — Other Ambulatory Visit (HOSPITAL_COMMUNITY): Payer: Self-pay

## 2020-11-12 ENCOUNTER — Other Ambulatory Visit: Payer: Self-pay | Admitting: Adult Health

## 2020-11-12 DIAGNOSIS — G90529 Complex regional pain syndrome I of unspecified lower limb: Secondary | ICD-10-CM

## 2020-11-12 DIAGNOSIS — M8700 Idiopathic aseptic necrosis of unspecified bone: Secondary | ICD-10-CM

## 2020-11-13 ENCOUNTER — Other Ambulatory Visit (HOSPITAL_COMMUNITY): Payer: Self-pay

## 2020-11-13 MED ORDER — OXYCODONE HCL 10 MG PO TABS
10.0000 mg | ORAL_TABLET | Freq: Three times a day (TID) | ORAL | 0 refills | Status: DC
  Filled 2020-11-13: qty 90, 30d supply, fill #0

## 2020-11-13 NOTE — Telephone Encounter (Signed)
Okay for refill?    LOV 10/31/2020   Last Refill  10/30/2020     QTY. 42        Refills 0

## 2020-11-15 ENCOUNTER — Other Ambulatory Visit (HOSPITAL_COMMUNITY): Payer: Self-pay

## 2020-11-15 MED ORDER — NICOTINE 21 MG/24HR TD PT24
MEDICATED_PATCH | TRANSDERMAL | 0 refills | Status: DC
  Filled 2020-11-15: qty 14, 14d supply, fill #0

## 2020-11-16 ENCOUNTER — Other Ambulatory Visit: Payer: Self-pay

## 2020-11-16 ENCOUNTER — Other Ambulatory Visit (HOSPITAL_COMMUNITY): Payer: Self-pay

## 2020-11-16 MED ORDER — NICOTINE POLACRILEX 4 MG MT LOZG
LOZENGE | OROMUCOSAL | 0 refills | Status: DC
  Filled 2020-11-16: qty 72, 10d supply, fill #0

## 2020-11-20 NOTE — Pre-Procedure Instructions (Signed)
Surgical Instructions    Your procedure is scheduled on Monday 12/03/20.   Report to [facility] Main Entrance "A" at 07:45 A.M., then check in with the Admitting office.  Call this number if you have problems the morning of surgery:  [phone]   If you have any questions prior to your surgery date call [phone]: Open Monday-Friday 8am-4pm    Remember:  Do not eat after midnight the night before your surgery  You may drink clear liquids until 06:45 A.M. the morning of your surgery.   Clear liquids allowed are: Water, Non-Citrus Juices (without pulp), Carbonated Beverages, Clear Tea, Black Coffee ONLY (NO MILK, CREAM OR POWDERED CREAMER of any kind), and Gatorade  Patient Instructions  The night before surgery:  No food after midnight. ONLY clear liquids after midnight  The day of surgery (if you do NOT have diabetes):  Drink ONE (1) Pre-Surgery Clear Ensure by 06:45 A.M. the morning of surgery. Drink in one sitting. Do not sip.  This drink was given to you during your hospital  pre-op appointment visit.  Nothing else to drink after completing the  Pre-Surgery Clear Ensure.         If you have questions, please contact your surgeon's office.     Take these medicines the morning of surgery with A SIP OF WATER   gabapentin (NEURONTIN)   Oxycodone HCl   pantoprazole (PROTONIX)    Take these medicines if needed:   famotidine (PEPCID)   ondansetron (ZOFRAN ODT)    As of today, STOP taking any Aspirin (unless otherwise instructed by your surgeon) Aleve, Naproxen, Ibuprofen, Motrin, Advil, Goody's, BC's, all herbal medications, fish oil, and all vitamins.          Do not wear jewelry or makeup Do not wear lotions, powders, perfumes/colognes, or deodorant. Do not shave 48 hours prior to surgery.  Men may shave face and neck. Do not bring valuables to the hospital. DO Not wear nail polish, gel polish, artificial nails, or any other type of covering on natural nails  including finger and toenails. If patients have artificial nails, gel coating, etc. that need to be removed by a nail salon please have this removed prior to surgery or surgery may need to be canceled/delayed if the surgeon/ anesthesia feels like the patient is unable to be adequately monitored.             [facility] is not responsible for any belongings or valuables.  Do NOT Smoke (Tobacco/Vaping) or drink Alcohol 24 hours prior to your procedure If you use a CPAP at night, you may bring all equipment for your overnight stay.   Contacts, glasses, dentures or bridgework may not be worn into surgery, please bring cases for these belongings   For patients admitted to the hospital, discharge time will be determined by your treatment team.   Patients discharged the day of surgery will not be allowed to drive home, and someone needs to stay with them for 24 hours.  ONLY 1 SUPPORT PERSON MAY BE PRESENT WHILE YOU ARE IN SURGERY. IF YOU ARE TO BE ADMITTED ONCE YOU ARE IN YOUR ROOM YOU WILL BE ALLOWED TWO (2) VISITORS.  Minor children may have two parents present. Special consideration for safety and communication needs will be reviewed on a case by case basis.  Special instructions:    Oral Hygiene is also important to reduce your risk of infection.  Remember - BRUSH YOUR TEETH THE MORNING OF SURGERY WITH YOUR  REGULAR TOOTHPASTE   [facility]- Preparing For Surgery  Before surgery, you can play an important role. Because skin is not sterile, your skin needs to be as free of germs as possible. You can reduce the number of germs on your skin by washing with CHG (chlorahexidine gluconate) Soap before surgery.  CHG is an antiseptic cleaner which kills germs and bonds with the skin to continue killing germs even after washing.     Please do not use if you have an allergy to CHG or antibacterial soaps. If your skin becomes reddened/irritated stop using the CHG.  Do not shave (including legs and  underarms) for at least 48 hours prior to first CHG shower. It is OK to shave your face.  Please follow these instructions carefully.     Shower the NIGHT BEFORE SURGERY and the MORNING OF SURGERY with CHG Soap.   If you chose to wash your hair, wash your hair first as usual with your normal shampoo. After you shampoo, rinse your hair and body thoroughly to remove the shampoo.  Then [employer] and genitals (private parts) with your normal soap and rinse thoroughly to remove soap.  After that Use CHG Soap as you would any other liquid soap. You can apply CHG directly to the skin and wash gently with a scrungie or a clean washcloth.   Apply the CHG Soap to your body ONLY FROM THE NECK DOWN.  Do not use on open wounds or open sores. Avoid contact with your eyes, ears, mouth and genitals (private parts). Wash Face and genitals (private parts)  with your normal soap.   Wash thoroughly, paying special attention to the area where your surgery will be performed.  Thoroughly rinse your body with warm water from the neck down.  DO NOT shower/wash with your normal soap after using and rinsing off the CHG Soap.  Pat yourself dry with a CLEAN TOWEL.  Wear CLEAN PAJAMAS to bed the night before surgery  Place CLEAN SHEETS on your bed the night before your surgery  DO NOT SLEEP WITH PETS.   Day of Surgery:  Take a shower with CHG soap. Wear Clean/Comfortable clothing the morning of surgery Do not apply any deodorants/lotions.   Remember to brush your teeth WITH YOUR REGULAR TOOTHPASTE.   Please read over the following fact sheets that you were given.

## 2020-11-21 ENCOUNTER — Encounter (HOSPITAL_COMMUNITY): Payer: Self-pay

## 2020-11-21 ENCOUNTER — Encounter (HOSPITAL_COMMUNITY)
Admission: RE | Admit: 2020-11-21 | Discharge: 2020-11-21 | Disposition: A | Discharge status: Home / Self Care | Payer: No Typology Code available for payment source | Source: Ambulatory Visit | Attending: Orthopaedic Surgery | Admitting: Orthopaedic Surgery

## 2020-11-21 ENCOUNTER — Other Ambulatory Visit: Payer: Self-pay

## 2020-11-21 ENCOUNTER — Ambulatory Visit (HOSPITAL_COMMUNITY)
Admission: RE | Admit: 2020-11-21 | Discharge: 2020-11-21 | Disposition: A | Discharge status: Home / Self Care | Payer: No Typology Code available for payment source | Source: Ambulatory Visit | Attending: Physician Assistant | Admitting: Physician Assistant

## 2020-11-21 DIAGNOSIS — M87051 Idiopathic aseptic necrosis of right femur: Secondary | ICD-10-CM | POA: Diagnosis not present

## 2020-11-21 HISTORY — DX: Personal history of other diseases of the digestive system: Z87.19

## 2020-11-21 LAB — CBC WITH DIFFERENTIAL/PLATELET
Abs Immature Granulocytes: 0.02 10*3/uL (ref 0.00–0.07)
Basophils Absolute: 0 10*3/uL (ref 0.0–0.1)
Basophils Relative: 0 %
Eosinophils Absolute: 0.1 10*3/uL (ref 0.0–0.5)
Eosinophils Relative: 1 %
HCT: 49.5 % (ref 39.0–52.0)
Hemoglobin: 16.3 g/dL (ref 13.0–17.0)
Immature Granulocytes: 0 %
Lymphocytes Relative: 26 %
Lymphs Abs: 2.5 10*3/uL (ref 0.7–4.0)
MCH: 30.5 pg (ref 26.0–34.0)
MCHC: 32.9 g/dL (ref 30.0–36.0)
MCV: 92.5 fL (ref 80.0–100.0)
Monocytes Absolute: 0.6 10*3/uL (ref 0.1–1.0)
Monocytes Relative: 6 %
Neutro Abs: 6.2 10*3/uL (ref 1.7–7.7)
Neutrophils Relative %: 67 %
Platelets: 267 10*3/uL (ref 150–400)
RBC: 5.35 MIL/uL (ref 4.22–5.81)
RDW: 12.2 % (ref 11.5–15.5)
WBC: 9.5 10*3/uL (ref 4.0–10.5)
nRBC: 0 % (ref 0.0–0.2)

## 2020-11-21 LAB — TYPE AND SCREEN
ABO/RH(D): A POS
Antibody Screen: NEGATIVE

## 2020-11-21 LAB — URINALYSIS, ROUTINE W REFLEX MICROSCOPIC
Bilirubin Urine: NEGATIVE
Glucose, UA: NEGATIVE mg/dL
Hgb urine dipstick: NEGATIVE
Ketones, ur: 5 mg/dL — AB
Leukocytes,Ua: NEGATIVE
Nitrite: NEGATIVE
Protein, ur: NEGATIVE mg/dL
Specific Gravity, Urine: 1.029 (ref 1.005–1.030)
pH: 5 (ref 5.0–8.0)

## 2020-11-21 LAB — APTT: aPTT: 27 seconds (ref 24–36)

## 2020-11-21 LAB — SURGICAL PCR SCREEN
MRSA, PCR: NEGATIVE
Staphylococcus aureus: NEGATIVE

## 2020-11-21 LAB — PROTIME-INR
INR: 1 (ref 0.8–1.2)
Prothrombin Time: 13 seconds (ref 11.4–15.2)

## 2020-11-21 LAB — COMPREHENSIVE METABOLIC PANEL
ALT: 40 U/L (ref 0–44)
AST: 28 U/L (ref 15–41)
Albumin: 4.6 g/dL (ref 3.5–5.0)
Alkaline Phosphatase: 82 U/L (ref 38–126)
Anion gap: 10 (ref 5–15)
BUN: 15 mg/dL (ref 6–20)
CO2: 25 mmol/L (ref 22–32)
Calcium: 9.6 mg/dL (ref 8.9–10.3)
Chloride: 102 mmol/L (ref 98–111)
Creatinine, Ser: 0.92 mg/dL (ref 0.61–1.24)
GFR, Estimated: >60 mL/min (ref >60)
Glucose, Bld: 88 mg/dL (ref 70–99)
Potassium: 3.9 mmol/L (ref 3.5–5.1)
Sodium: 137 mmol/L (ref 135–145)
Total Bilirubin: 0.8 mg/dL (ref 0.3–1.2)
Total Protein: 7.6 g/dL (ref 6.5–8.1)

## 2020-11-21 IMAGING — CR DG CHEST 2V
2 series · 2 of 2 positions shown · non-contrast
Comparison: Multiple previous studies dating back to [DATE]

CLINICAL DATA: Pre-op clearance exam for total hip arthroplasty.

EXAM:
CHEST - 2 VIEW

[w chest pa]
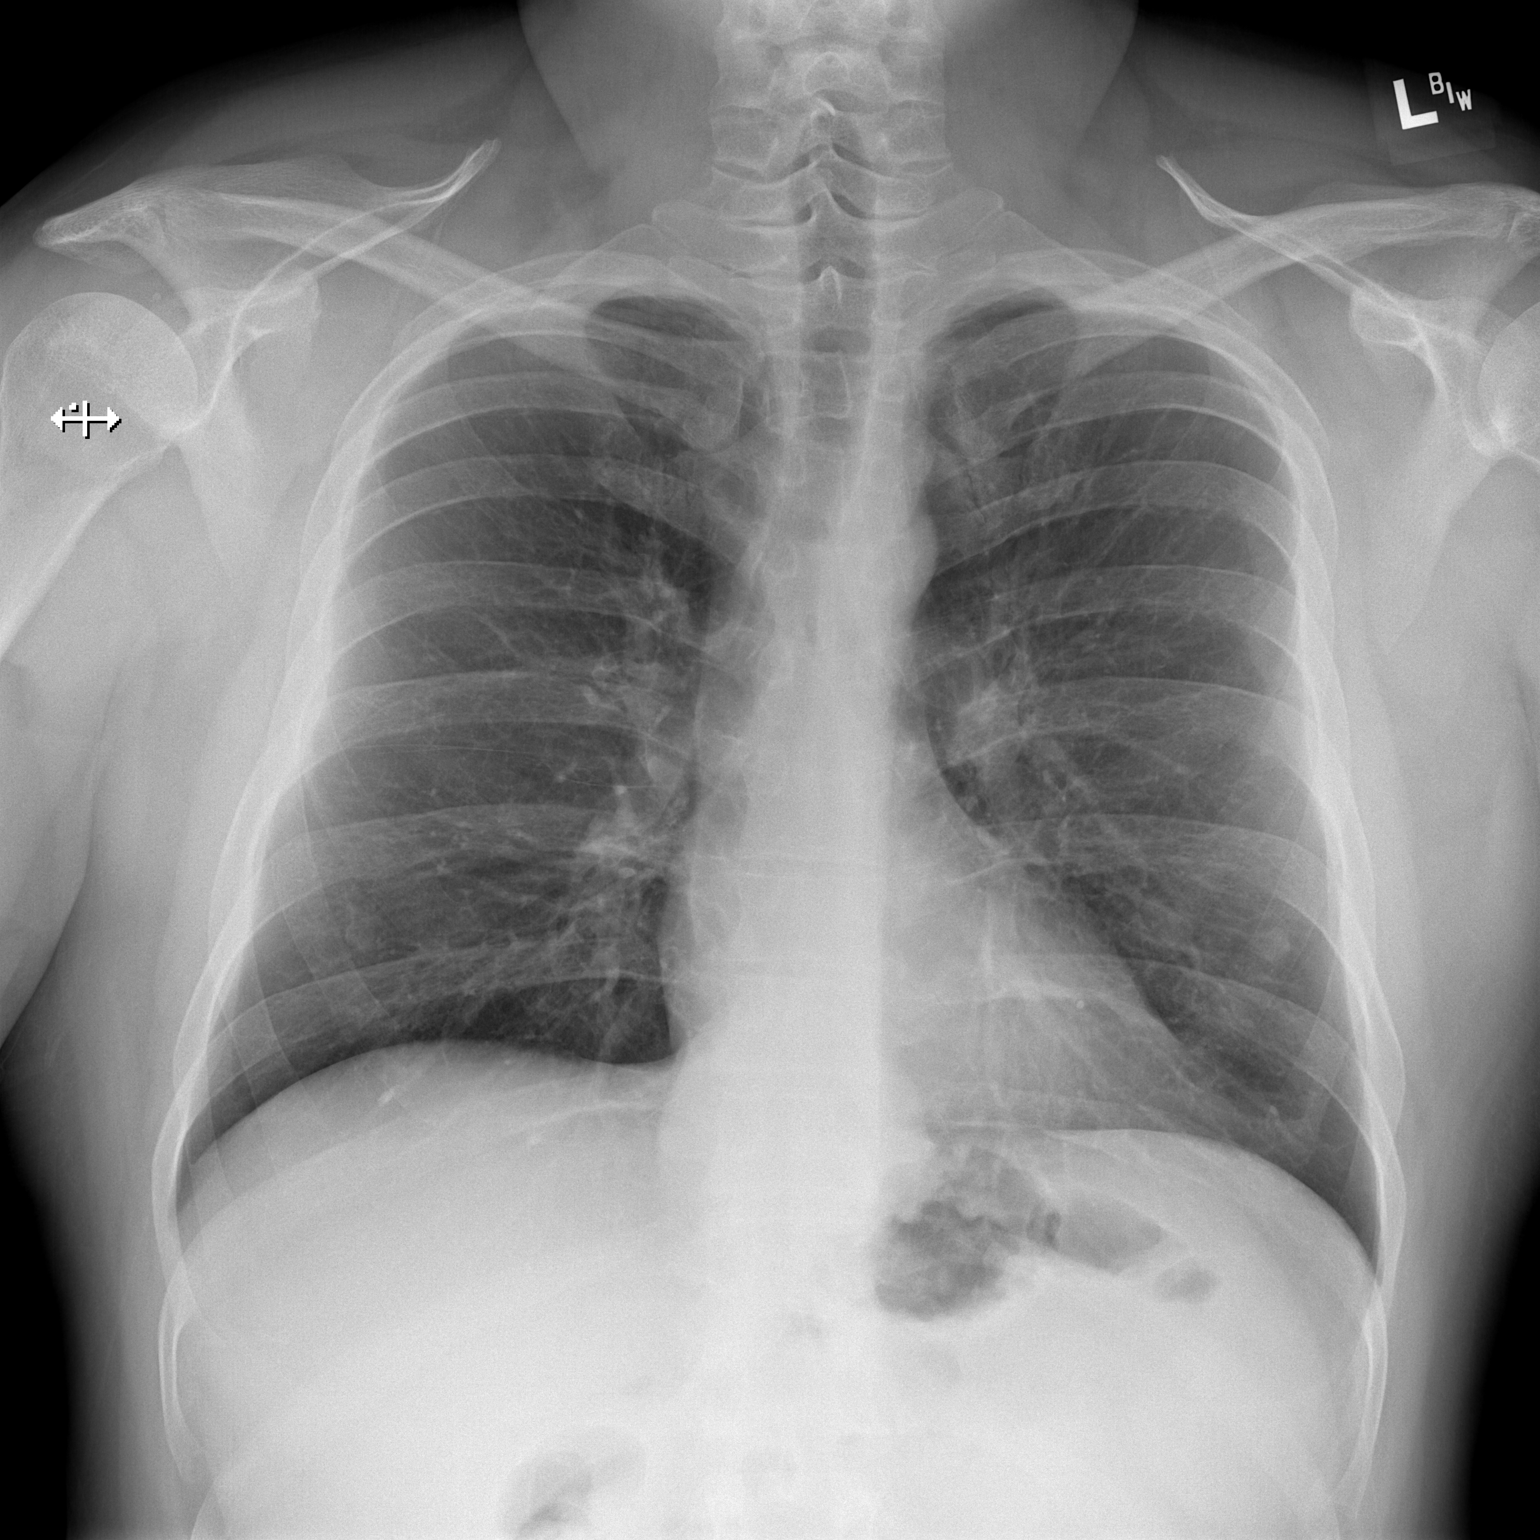

[w chest lat]
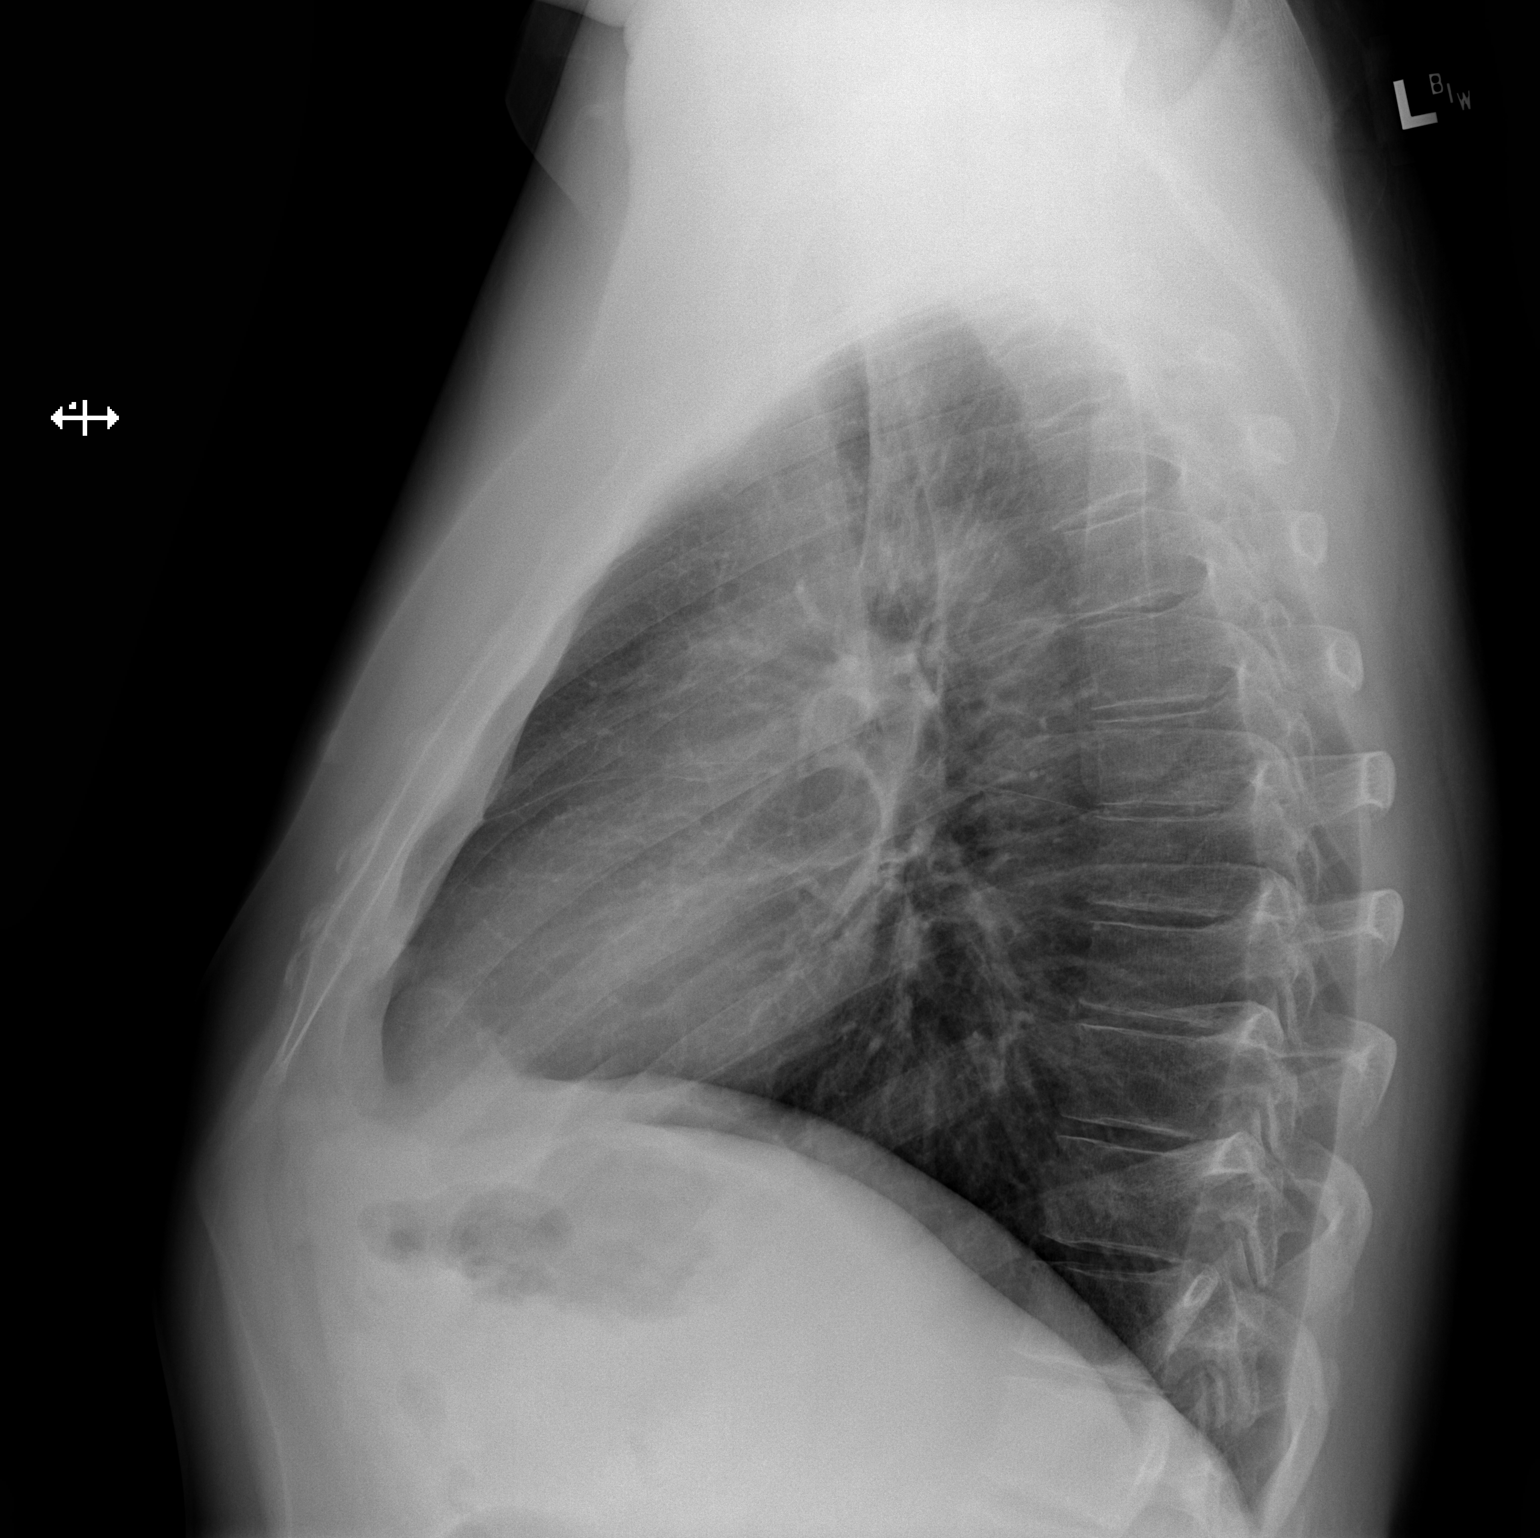

[2 of 2 positions shown; findings below may reference images not displayed]

FINDINGS: The heart size and mediastinal contours are within normal limits.
Both lungs are clear. Nipple shadow overlying left lower lung is
unchanged since prior study. The visualized skeletal structures are
unremarkable.
IMPRESSION: Stable exam.  No active cardiopulmonary disease.

## 2020-11-21 NOTE — Progress Notes (Addendum)
PCP - Shirline Frees, NP Cardiologist - Denies  PPM/ICD - Denies  Chest x-ray - 11/21/20 EKG - 11/21/20 Stress Test - Denies ECHO - Denies Cardiac Cath - Denies  Sleep Study - Denies  DM- Denies  Blood Thinner Instructions: N/A Aspirin Instructions: N/A  ERAS Protcol - Yes PRE-SURGERY Ensure or G2- Ensure Given  COVID TEST- No. Pt tested positive for COVID 09/17/20 via home test and notified his provider (see Pt message under Encounters with picture attachment); Provider, Shirline Frees, NP mentioned positive COVID status in his note 09/26/20.   Anesthesia review: Yes, abnormal EKG  Patient denies shortness of breath, fever, cough and chest pain at PAT appointment   All instructions explained to the patient, with a verbal understanding of the material. Patient agrees to go over the instructions while at home for a better understanding. The opportunity to ask questions was provided.

## 2020-11-22 ENCOUNTER — Other Ambulatory Visit: Payer: Self-pay | Admitting: Physician Assistant

## 2020-11-27 ENCOUNTER — Other Ambulatory Visit (HOSPITAL_COMMUNITY): Payer: Self-pay

## 2020-11-27 ENCOUNTER — Other Ambulatory Visit: Payer: Self-pay

## 2020-11-27 ENCOUNTER — Encounter: Payer: Self-pay | Admitting: Adult Health

## 2020-11-27 ENCOUNTER — Ambulatory Visit (INDEPENDENT_AMBULATORY_CARE_PROVIDER_SITE_OTHER): Payer: No Typology Code available for payment source | Admitting: Adult Health

## 2020-11-27 VITALS — BP 130/88 | HR 113 | Temp 98.8°F | Ht 71.0 in | Wt 220.0 lb

## 2020-11-27 DIAGNOSIS — G90529 Complex regional pain syndrome I of unspecified lower limb: Secondary | ICD-10-CM | POA: Diagnosis not present

## 2020-11-27 DIAGNOSIS — M87051 Idiopathic aseptic necrosis of right femur: Secondary | ICD-10-CM | POA: Diagnosis not present

## 2020-11-27 MED ORDER — NORTRIPTYLINE HCL 75 MG PO CAPS
75.0000 mg | ORAL_CAPSULE | Freq: Every day | ORAL | 0 refills | Status: DC
Start: 2020-11-27 — End: 2021-02-20
  Filled 2020-11-27: qty 90, 90d supply, fill #0

## 2020-11-27 MED ORDER — OXYCODONE HCL 10 MG PO TABS
10.0000 mg | ORAL_TABLET | Freq: Four times a day (QID) | ORAL | 0 refills | Status: DC
  Filled 2020-11-27 – 2020-12-04 (×2): qty 120, 30d supply, fill #0

## 2020-11-27 MED ORDER — GABAPENTIN 300 MG PO CAPS
300.0000 mg | ORAL_CAPSULE | Freq: Three times a day (TID) | ORAL | 0 refills | Status: DC
  Filled 2020-11-27: qty 270, 90d supply, fill #0

## 2020-11-27 NOTE — Addendum Note (Signed)
Addended by: Kandra Nicolas on: 11/27/2020 04:07 PM   Modules accepted: Orders

## 2020-11-27 NOTE — Progress Notes (Signed)
Subjective:    Patient ID: Shane Cole., male    DOB: 03-08-1981, 40 y.o.   MRN: [id number]  HPI [facility] reviewed in EPIC   Indication for chronic opioid: Complex regional pain syndrome of lower extremity & Acvascular necrosis or right hip Medication and dose: Oxycodone 10 mg TID, Gabapentin 600 mg TID, Nortriptyline 75 mg QHS # pills per month: 90 tabs of Oxycodone per month  Last UDS date: 10/31/2020 Opioid Treatment Agreement signed: 10/31/2020 Opioid Treatment Agreement last reviewed with patient: 10/31/2020 [facility] reviewed this encounter (include red flags):  Reviewed with no red flags   He has an upcoming right hip replacement in 6 days due to avascular necrosis.  He is looking forward to having the surgery, reports that pain has gotten worse, he has fallen 2 times in the last month.  Reports that current pain management is helping to alleviate the pain but is not working as well as it once did due to increase in pain.  Does have an appointment with pain management scheduled in mid October.  Review of Systems See HPI   Past Medical History:  Diagnosis Date   Allergy    Anxiety    Bipolar disorder (HCC)    Bronchitis    Bronchitis    Crush injury lower leg    Left lower leg   Depression    Gastric ulcer    GERD (gastroesophageal reflux disease)    will awaken him in night   History of hiatal hernia    Hypertension    Migraine    Nerve pain    RSD (reflex sympathetic dystrophy)     Social History   Socioeconomic History   Marital status: Married    Spouse name: Not on file   Number of children: 1   Years of education: 10th grade   Highest education level: Not on file  Occupational History   Occupation: Disabled    Comment: Previously worked Web designer  Tobacco Use   Smoking status: Every Day    Packs/day: 0.25    Years: 23.00    Pack years: 5.75    Types: Cigarettes   Smokeless tobacco: Never   Tobacco comments:    Using  nicotine patch  Vaping Use   Vaping Use: Never used  Substance and Sexual Activity   Alcohol use: Yes    Alcohol/week: 6.0 standard drinks    Types: 6 Cans of beer per week    Comment: occasional   Drug use: No   Sexual activity: Yes    Partners: Female  Other Topics Concern   Not on file  Social History Narrative   05/24/12  [place] was born and grew up in [place], [place].    He has 2 sisters.    He reports that his childhood was "lousy."    He completed the 10th grade.    He has been married for 5 years, and is currently separated for 3 weeks.    He has a daughter who is 40-1/2 years old.    He has been unemployed for 2 years, and is disabled due to an on-the-job work accident.    He is currently living with his aunt and cousin.    He reports that his hobbies are sports and dogs.    He reports that he is spiritual, but not religious.    He states that his aunt and his father are his social support system.    He  denies any current legal problems, but got a DUI in 2007. 05/24/12 AHW      Social Determinants of Health   Financial Resource Strain: Not on file  Food Insecurity: Not on file  Transportation Needs: Not on file  Physical Activity: Not on file  Stress: Not on file  Social Connections: Not on file  Intimate Partner Violence: Not on file    Past Surgical History:  Procedure Laterality Date   CHOLECYSTECTOMY N/A 01/24/2014   Procedure: LAPAROSCOPIC CHOLECYSTECTOMY;  Surgeon: Axel Filler, MD;  Location: [facility] OR;  Service: General;  Laterality: N/A;   COLONOSCOPY     HAND SURGERY     MASS EXCISION Left 01/25/2013   Procedure: EXCISION MASS DORSAL ASPECT LEFT LONG FINGER DISTAL INTERPHALANGEAL JOINT;  Surgeon: Wyn Forster., MD;  Location: [facility];  Service: Orthopedics;  Laterality: Left;  Left long    MOUTH SURGERY      Family History  Problem Relation Age of Onset   Hyperlipidemia Father    Hypertension Father    Anxiety disorder  Father    Drug abuse Father    Cancer Paternal Grandfather        lung, colon   Colon cancer Paternal Grandfather    Lung cancer Paternal Grandfather    Diabetes Paternal Grandmother    Cancer Paternal Grandmother    Cancer Maternal Grandmother    Cancer Maternal Grandfather    Lung cancer Maternal Grandfather    Stroke Other    Heart disease Other    Depression Paternal Aunt    Anxiety disorder Paternal Aunt    Drug abuse Paternal Uncle    Colon cancer Paternal Uncle    Esophageal cancer Neg Hx    Stomach cancer Neg Hx    Rectal cancer Neg Hx     Allergies  Allergen Reactions   Aspirin Shortness Of Breath and Tinitus    Tinnitus, dizzy, short of breath   Codeine Itching    Reaction to tylenol #3   Penicillins Anaphylaxis and Other (See Comments)    Was told never to take medication.  Has patient had a PCN reaction causing immediate rash, facial/tongue/throat swelling, SOB or lightheadedness with hypotension: no Has patient had a PCN reaction causing severe rash involving mucus membranes or skin necrosis: no Has patient had a PCN reaction that required hospitalization: no Has patient had a PCN reaction occurring within the last 10 years: no If all of the above answers are "NO", then may proceed with Cephalosporin use.    Tramadol     seizure    Amitriptyline Rash    Current Outpatient Medications on File Prior to Visit  Medication Sig Dispense Refill   diphenhydrAMINE (BENADRYL) 25 MG tablet Take 25 mg by mouth every 6 (six) hours as needed for allergies.     famotidine (PEPCID) 20 MG tablet Take 20 mg by mouth daily as needed for heartburn.     lidocaine (LIDODERM) 5 % Place 1 patch onto the skin daily. Remove & Discard patch within 12 hours or as directed by MD     lidocaine (XYLOCAINE) 5 % ointment Apply 1 application topically as needed for moderate pain. 50 g 2   Lidocaine 3 % CREA Apply 1 application topically daily as needed (pain).     Nerve Stimulator (TENS  THERAPY PAIN RELIEF) DEVI 1 application by Does not apply route daily as needed (chronic pain).     nicotine (NICODERM CQ - DOSED IN MG/24  HOURS) 21 mg/24hr patch Apply daily as directed. 14 patch 0   ondansetron (ZOFRAN ODT) 4 MG disintegrating tablet Dissolve 1 tablet (4 mg total) by mouth every 8 (eight) hours as needed for nausea or vomiting. 20 tablet 2   pantoprazole (PROTONIX) 40 MG tablet Take 1 tablet twice daily 30 to 60 minutes before breakfast and dinner. (Patient taking differently: Take 40 mg by mouth in the morning.) 60 tablet 5   sildenafil (REVATIO) 20 MG tablet TAKE 1 TABLET BY MOUTH 2 TIMES DAILY 180 tablet 1   Multiple Vitamin (MULTIVITAMIN WITH MINERALS) TABS tablet Take 1 tablet by mouth daily. (Patient not taking: Reported on 11/27/2020)     No current facility-administered medications on file prior to visit.    BP 130/88   Pulse (!) 113   Temp 98.8 F (37.1 C) (Oral)   Ht 5\' 11"  (1.803 m)   Wt 220 lb (99.8 kg)   SpO2 98%   BMI 30.68 kg/m       Objective:   Physical Exam Constitutional:      Appearance: Normal appearance.  Musculoskeletal:     Right hip: Tenderness and bony tenderness present. Decreased range of motion. Decreased strength.     Left hip: Normal.  Skin:    General: Skin is warm and dry.     Capillary Refill: Capillary refill takes less than 2 seconds.  Neurological:     Mental Status: He is alert.      Assessment & Plan:  1. Avascular necrosis of hip (Right) (HCC) - Will increase Oxycodone 10 mg to QID dosing from TID dosing temporarily until he has his surgery  - DRUG MONITOR, PANEL 1, W/CONF, URINE; Future - Oxycodone HCl 10 MG TABS; Take 1 tablet (10 mg total) by mouth every 6 (six) hours.  Dispense: 120 tablet; Refill: 0 - nortriptyline (PAMELOR) 75 MG capsule; Take 1 capsule (75 mg total) by mouth at bedtime.  Dispense: 90 capsule; Refill: 0 - gabapentin (NEURONTIN) 300 MG capsule; Take 1 capsule (300 mg total) by mouth 3 (three)  times daily.  Dispense: 270 capsule; Refill: 0  2. Complex regional pain syndrome type 1 of lower extremity, unspecified laterality  - DRUG MONITOR, PANEL 1, W/CONF, URINE; Future - Oxycodone HCl 10 MG TABS; Take 1 tablet (10 mg total) by mouth every 6 (six) hours.  Dispense: 120 tablet; Refill: 0 - nortriptyline (PAMELOR) 75 MG capsule; Take 1 capsule (75 mg total) by mouth at bedtime.  Dispense: 90 capsule; Refill: 0 - gabapentin (NEURONTIN) 300 MG capsule; Take 1 capsule (300 mg total) by mouth 3 (three) times daily.  Dispense: 270 capsule; Refill: 0  , NP

## 2020-11-28 ENCOUNTER — Other Ambulatory Visit: Payer: Self-pay | Admitting: Physician Assistant

## 2020-11-28 ENCOUNTER — Other Ambulatory Visit (HOSPITAL_COMMUNITY): Payer: Self-pay

## 2020-11-28 MED ORDER — OXYCODONE-ACETAMINOPHEN 7.5-325 MG PO TABS
1.0000 | ORAL_TABLET | Freq: Four times a day (QID) | ORAL | 0 refills | Status: DC | PRN
  Filled 2020-11-28 – 2021-01-01 (×2): qty 40, 10d supply, fill #0

## 2020-11-28 MED ORDER — METHOCARBAMOL 500 MG PO TABS
500.0000 mg | ORAL_TABLET | Freq: Two times a day (BID) | ORAL | 0 refills | Status: DC | PRN
  Filled 2020-11-28: qty 20, 10d supply, fill #0

## 2020-11-28 MED ORDER — DOCUSATE SODIUM 100 MG PO CAPS
100.0000 mg | ORAL_CAPSULE | Freq: Every day | ORAL | 2 refills | Status: AC | PRN
  Filled 2020-11-28: qty 30, 30d supply, fill #0

## 2020-11-28 MED ORDER — ONDANSETRON HCL 4 MG PO TABS
4.0000 mg | ORAL_TABLET | Freq: Three times a day (TID) | ORAL | 0 refills | Status: DC | PRN
  Filled 2020-11-28: qty 40, 14d supply, fill #0

## 2020-11-28 MED ORDER — RIVAROXABAN 10 MG PO TABS
10.0000 mg | ORAL_TABLET | Freq: Every day | ORAL | 0 refills | Status: DC
  Filled 2020-11-28: qty 35, 35d supply, fill #0

## 2020-11-29 ENCOUNTER — Other Ambulatory Visit (HOSPITAL_COMMUNITY): Payer: Self-pay

## 2020-11-29 ENCOUNTER — Telehealth: Payer: Self-pay | Admitting: Orthopaedic Surgery

## 2020-11-29 MED ORDER — NICOTINE 21 MG/24HR TD PT24
MEDICATED_PATCH | TRANSDERMAL | 0 refills | Status: DC
  Filled 2020-11-29: qty 28, 28d supply, fill #0

## 2020-11-29 MED ORDER — NICOTINE POLACRILEX 4 MG MT GUM
CHEWING_GUM | OROMUCOSAL | 0 refills | Status: DC
  Filled 2020-11-29: qty 110, 7d supply, fill #0

## 2020-11-29 NOTE — Telephone Encounter (Signed)
We can send Lovenox and see if this will be cheaper than Xarelto.  Unfortunately Plavix is not an approved DVT prophylaxis agent

## 2020-11-29 NOTE — Telephone Encounter (Signed)
Patient called advised the Xarelto is out of his price range even with insurance. Patient said his OOP expense would be $126.00 and he can't afford to pay that.   Patient asked if he can get a Rx for Plavix? The number to contact patient is [phone]

## 2020-11-30 LAB — DRUG MONITOR, PANEL 1, W/CONF, URINE
Amphetamines: NEGATIVE ng/mL (ref <500)
Barbiturates: NEGATIVE ng/mL (ref <300)
Benzodiazepines: NEGATIVE ng/mL (ref <100)
Cocaine Metabolite: NEGATIVE ng/mL (ref <150)
Codeine: NEGATIVE ng/mL (ref <50)
Creatinine: 30 mg/dL (ref >=20.0)
Hydrocodone: NEGATIVE ng/mL (ref <50)
Hydromorphone: NEGATIVE ng/mL (ref <50)
Marijuana Metabolite: NEGATIVE ng/mL (ref <20)
Methadone Metabolite: NEGATIVE ng/mL (ref <100)
Morphine: NEGATIVE ng/mL (ref <50)
Norhydrocodone: NEGATIVE ng/mL (ref <50)
Noroxycodone: 263 ng/mL — ABNORMAL HIGH (ref <50)
Opiates: NEGATIVE ng/mL (ref <100)
Oxidant: NEGATIVE ug/mL (ref <200)
Oxycodone: 184 ng/mL — ABNORMAL HIGH (ref <50)
Oxycodone: POSITIVE ng/mL — AB (ref <100)
Oxymorphone: 128 ng/mL — ABNORMAL HIGH (ref <50)
Phencyclidine: NEGATIVE ng/mL (ref <25)
pH: 5.9 (ref 4.5–9.0)

## 2020-11-30 LAB — DM TEMPLATE

## 2020-11-30 MED ORDER — TRANEXAMIC ACID 1000 MG/10ML IV SOLN
2000.0000 mg | INTRAVENOUS | Status: AC
  Administered 2020-12-03: 1000 mg via TOPICAL
  Filled 2020-11-30 (×2): qty 20

## 2020-11-30 NOTE — Telephone Encounter (Signed)
This encounter was opened in error. No patient interaction occurred.   

## 2020-12-03 ENCOUNTER — Encounter (HOSPITAL_COMMUNITY)
Admission: RE | Disposition: A | Discharge status: Home / Self Care | Payer: Self-pay | Source: Home / Self Care | Attending: Orthopaedic Surgery

## 2020-12-03 ENCOUNTER — Ambulatory Visit (HOSPITAL_COMMUNITY): Payer: No Typology Code available for payment source

## 2020-12-03 ENCOUNTER — Ambulatory Visit (HOSPITAL_COMMUNITY): Payer: No Typology Code available for payment source | Admitting: Anesthesiology

## 2020-12-03 ENCOUNTER — Other Ambulatory Visit (HOSPITAL_COMMUNITY): Payer: Self-pay

## 2020-12-03 ENCOUNTER — Other Ambulatory Visit: Payer: Self-pay

## 2020-12-03 ENCOUNTER — Observation Stay (HOSPITAL_COMMUNITY): Payer: No Typology Code available for payment source

## 2020-12-03 ENCOUNTER — Other Ambulatory Visit: Payer: Self-pay | Admitting: Physician Assistant

## 2020-12-03 ENCOUNTER — Encounter (HOSPITAL_COMMUNITY): Payer: Self-pay | Admitting: Orthopaedic Surgery

## 2020-12-03 ENCOUNTER — Observation Stay (HOSPITAL_COMMUNITY)
Admission: RE | Admit: 2020-12-03 | Discharge: 2020-12-04 | Disposition: A | Discharge status: Home / Self Care | Payer: No Typology Code available for payment source | Attending: Orthopaedic Surgery | Admitting: Orthopaedic Surgery

## 2020-12-03 ENCOUNTER — Ambulatory Visit (HOSPITAL_COMMUNITY): Payer: No Typology Code available for payment source | Admitting: Physician Assistant

## 2020-12-03 DIAGNOSIS — Z79899 Other long term (current) drug therapy: Secondary | ICD-10-CM | POA: Insufficient documentation

## 2020-12-03 DIAGNOSIS — Z96649 Presence of unspecified artificial hip joint: Secondary | ICD-10-CM

## 2020-12-03 DIAGNOSIS — I10 Essential (primary) hypertension: Secondary | ICD-10-CM | POA: Insufficient documentation

## 2020-12-03 DIAGNOSIS — M87051 Idiopathic aseptic necrosis of right femur: Secondary | ICD-10-CM

## 2020-12-03 DIAGNOSIS — F17210 Nicotine dependence, cigarettes, uncomplicated: Secondary | ICD-10-CM | POA: Insufficient documentation

## 2020-12-03 DIAGNOSIS — Z419 Encounter for procedure for purposes other than remedying health state, unspecified: Secondary | ICD-10-CM

## 2020-12-03 DIAGNOSIS — Z96641 Presence of right artificial hip joint: Secondary | ICD-10-CM

## 2020-12-03 HISTORY — PX: TOTAL HIP ARTHROPLASTY: SHX124

## 2020-12-03 LAB — CREATININE, SERUM
Creatinine, Ser: 1.29 mg/dL — ABNORMAL HIGH (ref 0.61–1.24)
GFR, Estimated: >60 mL/min (ref >60)

## 2020-12-03 LAB — CBC
HCT: 44.4 % (ref 39.0–52.0)
Hemoglobin: 14.7 g/dL (ref 13.0–17.0)
MCH: 30.1 pg (ref 26.0–34.0)
MCHC: 33.1 g/dL (ref 30.0–36.0)
MCV: 91 fL (ref 80.0–100.0)
Platelets: 241 10*3/uL (ref 150–400)
RBC: 4.88 MIL/uL (ref 4.22–5.81)
RDW: 11.9 % (ref 11.5–15.5)
WBC: 15.3 10*3/uL — ABNORMAL HIGH (ref 4.0–10.5)
nRBC: 0 % (ref 0.0–0.2)

## 2020-12-03 LAB — ABO/RH: ABO/RH(D): A POS

## 2020-12-03 IMAGING — DX DG PORTABLE PELVIS
1 series · 1 of 1 positions shown · non-contrast
Comparison: Intraoperative evaluation of the same date.

CLINICAL DATA: Postop film for RIGHT hip arthroplasty.

EXAM:
PORTABLE PELVIS 1-2 VIEWS

[pelvis]
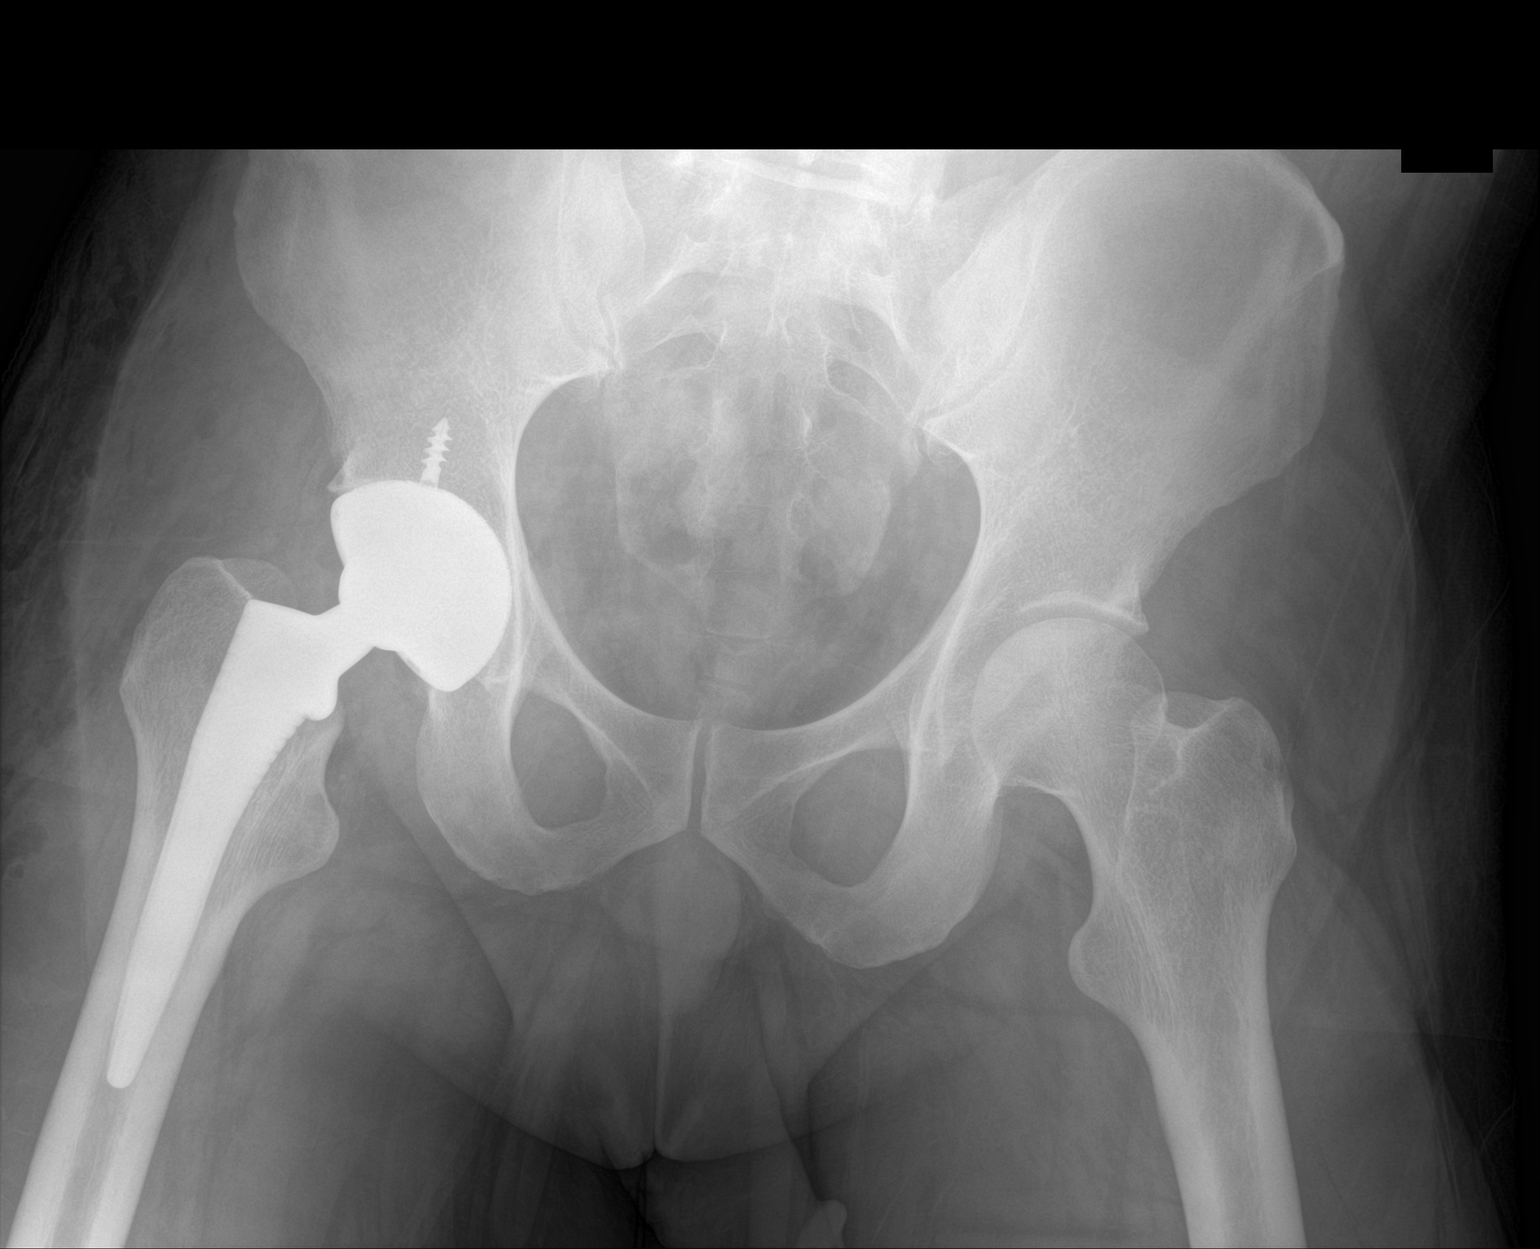

[1 of 1 positions shown; findings below may reference images not displayed]

FINDINGS: Postoperative changes of RIGHT total hip arthroplasty seen in AP
projection. No immediate complications noted. Soft tissue changes
with small amounts of gas in the local soft tissues around the
surgical site.

No acute findings of the bony pelvis.
IMPRESSION: Postoperative changes of RIGHT total hip arthroplasty. No immediate
complications noted.

## 2020-12-03 IMAGING — RF DG HIP (WITH PELVIS) OPERATIVE*R*
1 series · 2 of 2 positions shown · non-contrast
Comparison: [DATE]

CLINICAL DATA: Right hip replacement

EXAM:
OPERATIVE RIGHT HIP WITH PELVIS

[Series 1: unknown protocol · 0.20mm/px · 2 of 2 slices shown]
[im 1/2]
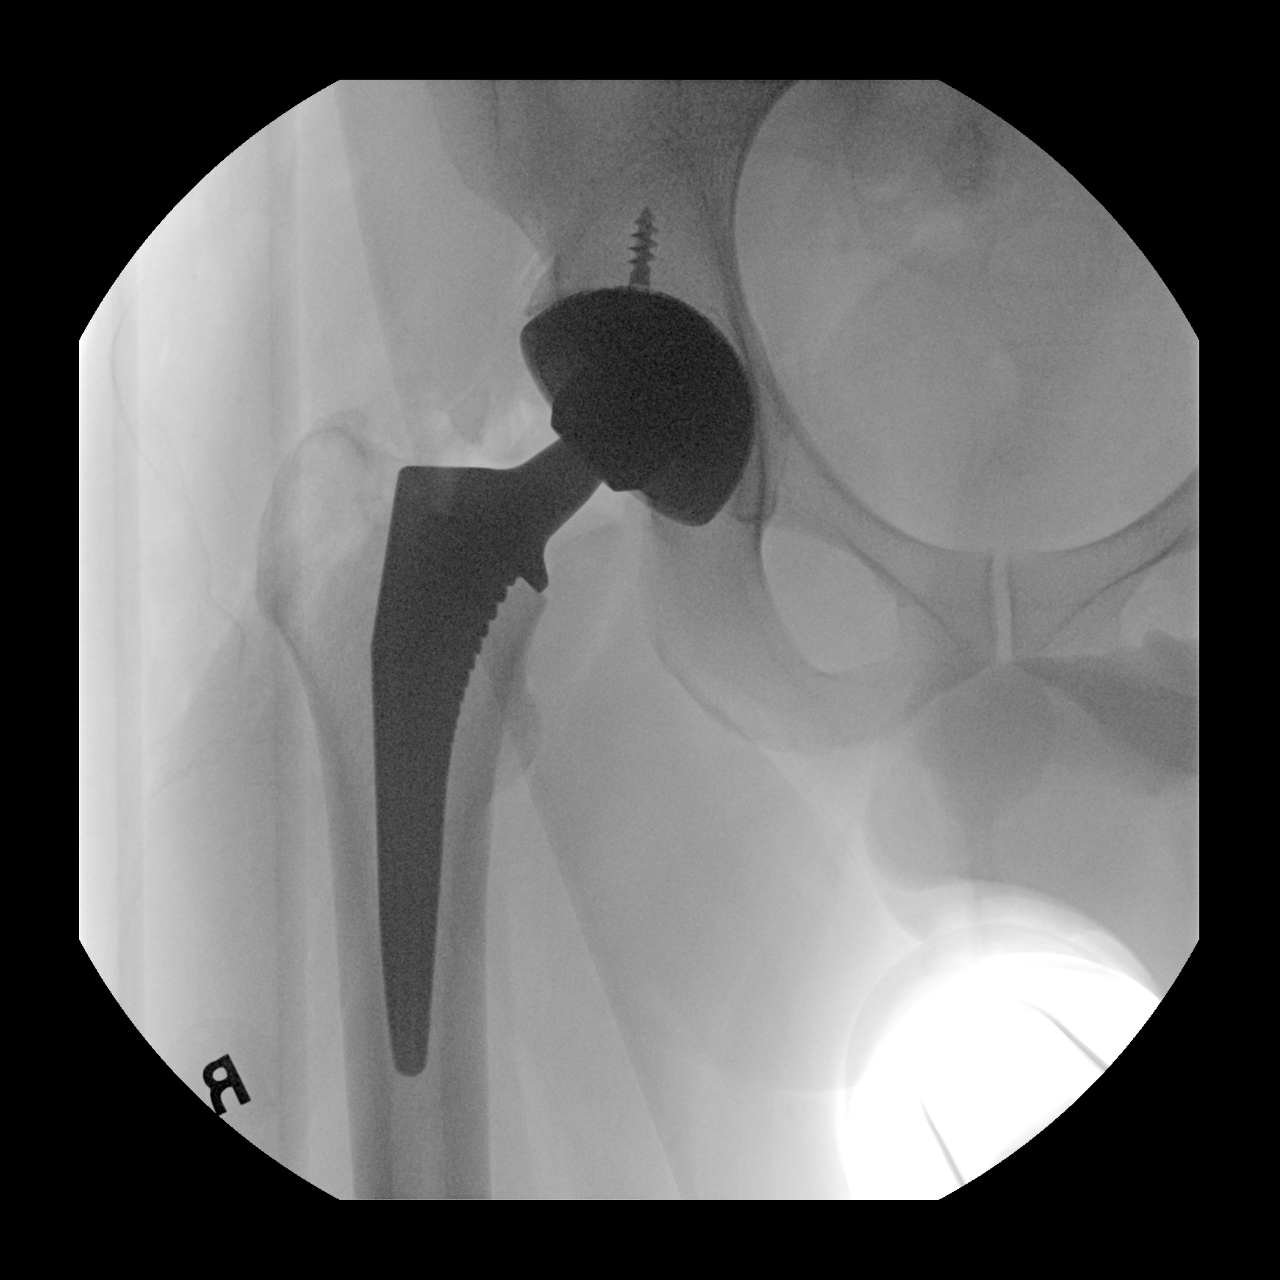
[im 2/2]
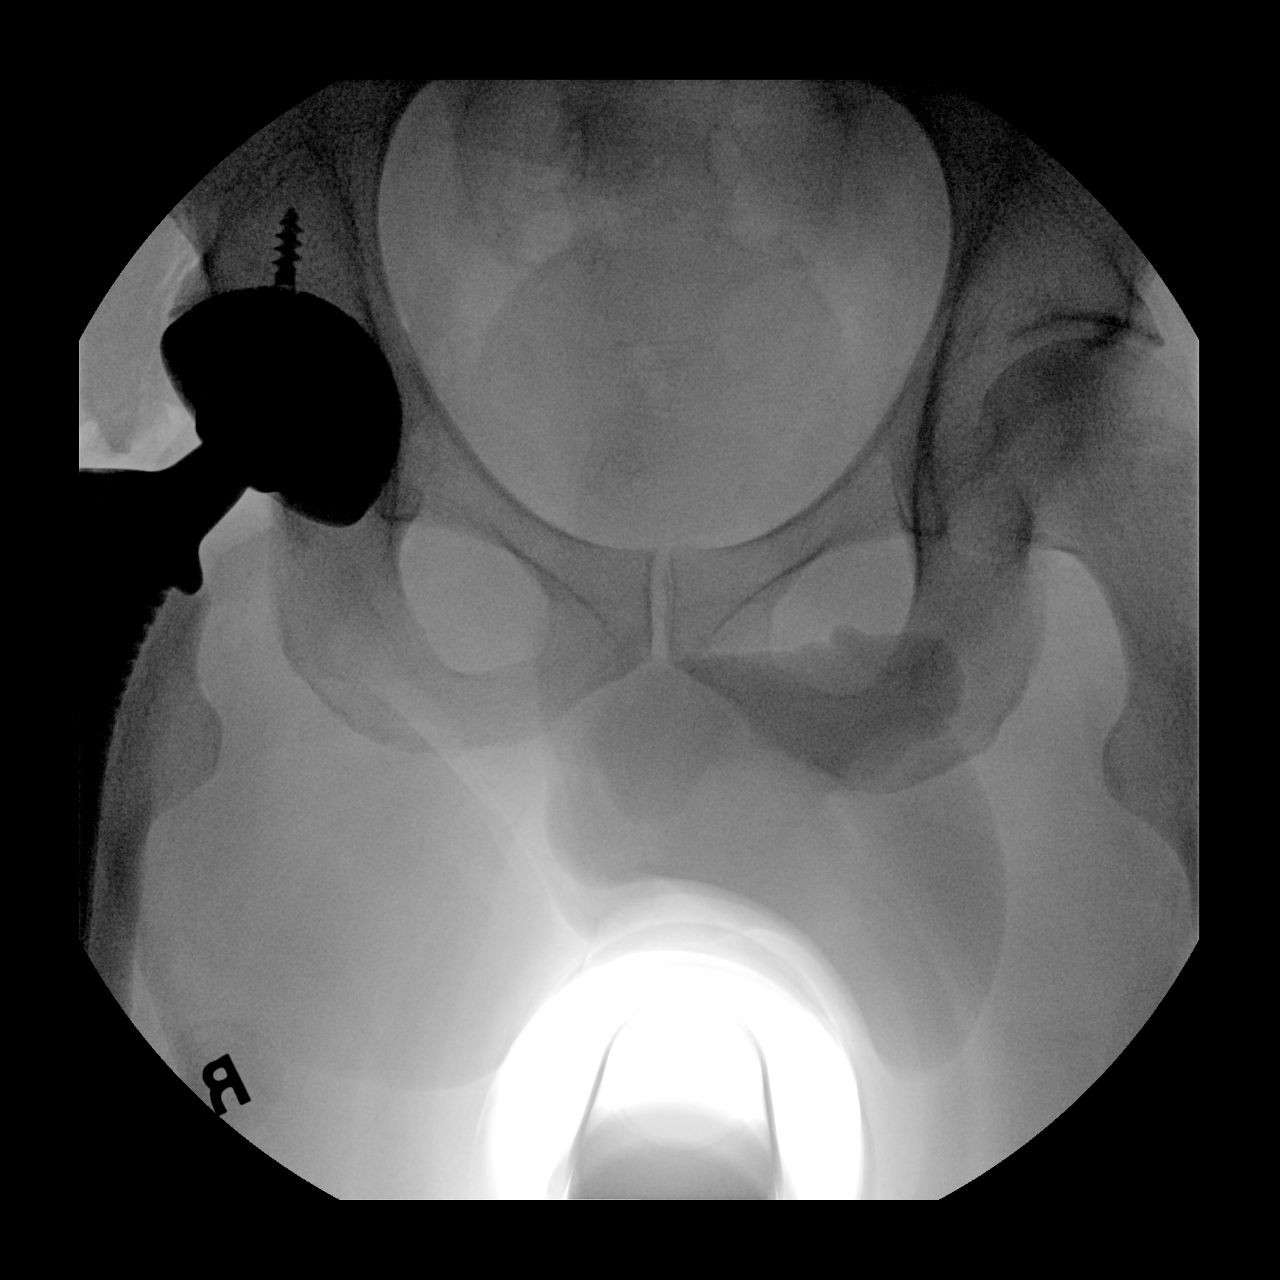

[2 of 2 positions shown; findings below may reference images not displayed]

FLUOROSCOPY TIME:  Radiation Exposure Index (as provided by the
fluoroscopic device): 4.09 mGy

If the device does not provide the exposure index:

Fluoroscopy Time:  32 seconds

Number of Acquired Images:  2
FINDINGS: Right hip prosthesis is noted in satisfactory position. No acute
bony or soft tissue abnormality is noted.
IMPRESSION: Status post right hip replacement.

## 2020-12-03 SURGERY — ARTHROPLASTY, HIP, TOTAL, ANTERIOR APPROACH
Anesthesia: Monitor Anesthesia Care | Site: Hip | Laterality: Right

## 2020-12-03 MED ORDER — ALUM & MAG HYDROXIDE-SIMETH 200-200-20 MG/5ML PO SUSP: 30.0000 mL | ORAL | Status: DC | PRN

## 2020-12-03 MED ORDER — POLYETHYLENE GLYCOL 3350 17 G PO PACK: 17.0000 g | PACK | Freq: Every day | ORAL | Status: DC

## 2020-12-03 MED ORDER — OXYCODONE HCL 5 MG PO TABS
ORAL_TABLET | ORAL | Status: AC
  Filled 2020-12-03: qty 1

## 2020-12-03 MED ORDER — LACTATED RINGERS IV SOLN: INTRAVENOUS | Status: DC

## 2020-12-03 MED ORDER — METOCLOPRAMIDE HCL 5 MG PO TABS: 5.0000 mg | ORAL_TABLET | Freq: Three times a day (TID) | ORAL | Status: DC | PRN

## 2020-12-03 MED ORDER — ACETAMINOPHEN 325 MG PO TABS: 325.0000 mg | ORAL_TABLET | Freq: Four times a day (QID) | ORAL | Status: DC | PRN

## 2020-12-03 MED ORDER — GABAPENTIN 300 MG PO CAPS
300.0000 mg | ORAL_CAPSULE | Freq: Three times a day (TID) | ORAL | Status: DC
  Administered 2020-12-03 – 2020-12-04 (×3): 300 mg via ORAL
  Filled 2020-12-03 (×3): qty 1

## 2020-12-03 MED ORDER — VANCOMYCIN HCL 1500 MG/300ML IV SOLN
1500.0000 mg | Freq: Two times a day (BID) | INTRAVENOUS | Status: AC
  Administered 2020-12-03 – 2020-12-04 (×2): 1500 mg via INTRAVENOUS
  Filled 2020-12-03 (×2): qty 300

## 2020-12-03 MED ORDER — PANTOPRAZOLE SODIUM 40 MG PO TBEC
40.0000 mg | DELAYED_RELEASE_TABLET | Freq: Every day | ORAL | Status: DC
  Administered 2020-12-04: 40 mg via ORAL
  Filled 2020-12-03: qty 1

## 2020-12-03 MED ORDER — ONDANSETRON HCL 4 MG PO TABS: 4.0000 mg | ORAL_TABLET | Freq: Four times a day (QID) | ORAL | Status: DC | PRN

## 2020-12-03 MED ORDER — TRANEXAMIC ACID-NACL 1000-0.7 MG/100ML-% IV SOLN
1000.0000 mg | INTRAVENOUS | Status: DC
  Filled 2020-12-03: qty 100

## 2020-12-03 MED ORDER — BUPIVACAINE IN DEXTROSE 0.75-8.25 % IT SOLN
INTRATHECAL | Status: DC | PRN
  Administered 2020-12-03: 2 mL via INTRATHECAL

## 2020-12-03 MED ORDER — METHOCARBAMOL 1000 MG/10ML IJ SOLN
500.0000 mg | Freq: Four times a day (QID) | INTRAVENOUS | Status: DC | PRN
  Filled 2020-12-03: qty 5

## 2020-12-03 MED ORDER — MEPERIDINE HCL 25 MG/ML IJ SOLN: 6.2500 mg | INTRAMUSCULAR | Status: DC | PRN

## 2020-12-03 MED ORDER — ACETAMINOPHEN 500 MG PO TABS
1000.0000 mg | ORAL_TABLET | Freq: Four times a day (QID) | ORAL | Status: AC
  Administered 2020-12-03 – 2020-12-04 (×4): 1000 mg via ORAL
  Filled 2020-12-03 (×4): qty 2

## 2020-12-03 MED ORDER — OXYCODONE HCL ER 10 MG PO T12A
10.0000 mg | EXTENDED_RELEASE_TABLET | Freq: Two times a day (BID) | ORAL | Status: DC
Start: 2020-12-03 — End: 2020-12-04
  Administered 2020-12-03 – 2020-12-04 (×2): 10 mg via ORAL
  Filled 2020-12-03 (×2): qty 1

## 2020-12-03 MED ORDER — ONDANSETRON HCL 4 MG/2ML IJ SOLN: 4.0000 mg | Freq: Four times a day (QID) | INTRAMUSCULAR | Status: DC | PRN

## 2020-12-03 MED ORDER — ONDANSETRON HCL 4 MG/2ML IJ SOLN
INTRAMUSCULAR | Status: DC | PRN
  Administered 2020-12-03: 4 mg via INTRAVENOUS

## 2020-12-03 MED ORDER — FENTANYL CITRATE (PF) 100 MCG/2ML IJ SOLN
25.0000 ug | INTRAMUSCULAR | Status: DC | PRN
  Administered 2020-12-03: 50 ug via INTRAVENOUS

## 2020-12-03 MED ORDER — ACETAMINOPHEN 160 MG/5ML PO SOLN: 325.0000 mg | ORAL | Status: DC | PRN

## 2020-12-03 MED ORDER — ONDANSETRON HCL 4 MG/2ML IJ SOLN
INTRAMUSCULAR | Status: AC
  Filled 2020-12-03: qty 2

## 2020-12-03 MED ORDER — VANCOMYCIN HCL 1 G IV SOLR
INTRAVENOUS | Status: DC | PRN
  Administered 2020-12-03: 1000 mg

## 2020-12-03 MED ORDER — DEXAMETHASONE SODIUM PHOSPHATE 10 MG/ML IJ SOLN
10.0000 mg | Freq: Once | INTRAMUSCULAR | Status: AC
  Administered 2020-12-04: 10 mg via INTRAVENOUS
  Filled 2020-12-03: qty 1

## 2020-12-03 MED ORDER — BUPIVACAINE-MELOXICAM ER 400-12 MG/14ML IJ SOLN
INTRAMUSCULAR | Status: AC
  Filled 2020-12-03: qty 1

## 2020-12-03 MED ORDER — METOCLOPRAMIDE HCL 5 MG/ML IJ SOLN: 5.0000 mg | Freq: Three times a day (TID) | INTRAMUSCULAR | Status: DC | PRN

## 2020-12-03 MED ORDER — PHENYLEPHRINE 40 MCG/ML (10ML) SYRINGE FOR IV PUSH (FOR BLOOD PRESSURE SUPPORT)
PREFILLED_SYRINGE | INTRAVENOUS | Status: AC
  Filled 2020-12-03: qty 10

## 2020-12-03 MED ORDER — NICOTINE 7 MG/24HR TD PT24
7.0000 mg | MEDICATED_PATCH | Freq: Every day | TRANSDERMAL | Status: DC
  Filled 2020-12-03: qty 1

## 2020-12-03 MED ORDER — DIPHENHYDRAMINE HCL 12.5 MG/5ML PO ELIX: 25.0000 mg | ORAL_SOLUTION | ORAL | Status: DC | PRN

## 2020-12-03 MED ORDER — OXYCODONE HCL 5 MG PO TABS: 10.0000 mg | ORAL_TABLET | ORAL | Status: DC | PRN

## 2020-12-03 MED ORDER — DEXAMETHASONE SODIUM PHOSPHATE 10 MG/ML IJ SOLN
INTRAMUSCULAR | Status: AC
  Filled 2020-12-03: qty 1

## 2020-12-03 MED ORDER — VANCOMYCIN HCL 1000 MG IV SOLR
INTRAVENOUS | Status: AC
  Filled 2020-12-03: qty 20

## 2020-12-03 MED ORDER — OXYCODONE HCL 5 MG PO TABS
5.0000 mg | ORAL_TABLET | ORAL | Status: DC | PRN
  Administered 2020-12-04: 5 mg via ORAL
  Filled 2020-12-03 (×2): qty 1

## 2020-12-03 MED ORDER — PROPOFOL 500 MG/50ML IV EMUL
INTRAVENOUS | Status: DC | PRN
  Administered 2020-12-03: 75 ug/kg/min via INTRAVENOUS

## 2020-12-03 MED ORDER — APIXABAN 2.5 MG PO TABS
2.5000 mg | ORAL_TABLET | Freq: Two times a day (BID) | ORAL | 0 refills | Status: DC
  Filled 2020-12-03 – 2020-12-04 (×3): qty 60, 30d supply, fill #0

## 2020-12-03 MED ORDER — 0.9 % SODIUM CHLORIDE (POUR BTL) OPTIME
TOPICAL | Status: DC | PRN
  Administered 2020-12-03: 1000 mL

## 2020-12-03 MED ORDER — ORAL CARE MOUTH RINSE: 15.0000 mL | Freq: Once | OROMUCOSAL | Status: AC

## 2020-12-03 MED ORDER — MIDAZOLAM HCL 5 MG/5ML IJ SOLN
INTRAMUSCULAR | Status: DC | PRN
  Administered 2020-12-03 (×2): 2 mg via INTRAVENOUS

## 2020-12-03 MED ORDER — IRRISEPT - 450ML BOTTLE WITH 0.05% CHG IN STERILE WATER, USP 99.95% OPTIME
TOPICAL | Status: DC | PRN
  Administered 2020-12-03: 450 mL via TOPICAL

## 2020-12-03 MED ORDER — DEXAMETHASONE SODIUM PHOSPHATE 10 MG/ML IJ SOLN
INTRAMUSCULAR | Status: DC | PRN
  Administered 2020-12-03: 10 mg via INTRAVENOUS

## 2020-12-03 MED ORDER — ONDANSETRON HCL 4 MG/2ML IJ SOLN: 4.0000 mg | Freq: Once | INTRAMUSCULAR | Status: DC | PRN

## 2020-12-03 MED ORDER — FENTANYL CITRATE (PF) 100 MCG/2ML IJ SOLN
INTRAMUSCULAR | Status: DC | PRN
  Administered 2020-12-03: 100 ug via INTRAVENOUS
  Administered 2020-12-03: 50 ug via INTRAVENOUS

## 2020-12-03 MED ORDER — DOCUSATE SODIUM 100 MG PO CAPS
100.0000 mg | ORAL_CAPSULE | Freq: Two times a day (BID) | ORAL | Status: DC
  Administered 2020-12-03: 100 mg via ORAL
  Filled 2020-12-03 (×2): qty 1

## 2020-12-03 MED ORDER — NORTRIPTYLINE HCL 25 MG PO CAPS
75.0000 mg | ORAL_CAPSULE | Freq: Every day | ORAL | Status: DC
  Administered 2020-12-03: 75 mg via ORAL
  Filled 2020-12-03 (×2): qty 3

## 2020-12-03 MED ORDER — PHENOL 1.4 % MT LIQD: 1.0000 | OROMUCOSAL | Status: DC | PRN

## 2020-12-03 MED ORDER — TRANEXAMIC ACID-NACL 1000-0.7 MG/100ML-% IV SOLN
1000.0000 mg | Freq: Once | INTRAVENOUS | Status: AC
  Administered 2020-12-03: 1000 mg via INTRAVENOUS
  Filled 2020-12-03: qty 100

## 2020-12-03 MED ORDER — APIXABAN 2.5 MG PO TABS
2.5000 mg | ORAL_TABLET | Freq: Two times a day (BID) | ORAL | Status: DC
  Administered 2020-12-04: 2.5 mg via ORAL
  Filled 2020-12-03: qty 1

## 2020-12-03 MED ORDER — ENOXAPARIN SODIUM 40 MG/0.4ML IJ SOSY
40.0000 mg | PREFILLED_SYRINGE | INTRAMUSCULAR | 0 refills | Status: DC
  Filled 2020-12-03: qty 8.4, 21d supply, fill #0

## 2020-12-03 MED ORDER — MIDAZOLAM HCL 2 MG/2ML IJ SOLN
INTRAMUSCULAR | Status: AC
  Filled 2020-12-03: qty 2

## 2020-12-03 MED ORDER — BUPIVACAINE-MELOXICAM ER 400-12 MG/14ML IJ SOLN
INTRAMUSCULAR | Status: DC | PRN
  Administered 2020-12-03: 400 mg

## 2020-12-03 MED ORDER — FENTANYL CITRATE (PF) 250 MCG/5ML IJ SOLN
INTRAMUSCULAR | Status: AC
  Filled 2020-12-03: qty 5

## 2020-12-03 MED ORDER — POVIDONE-IODINE 10 % EX SWAB
2.0000 "application " | Freq: Once | CUTANEOUS | Status: AC
  Administered 2020-12-03: 2 via TOPICAL

## 2020-12-03 MED ORDER — OXYCODONE HCL 5 MG/5ML PO SOLN: 5.0000 mg | Freq: Once | ORAL | Status: AC | PRN

## 2020-12-03 MED ORDER — SODIUM CHLORIDE 0.9 % IV SOLN: INTRAVENOUS | Status: DC

## 2020-12-03 MED ORDER — TRANEXAMIC ACID 1000 MG/10ML IV SOLN
INTRAVENOUS | Status: DC | PRN
  Administered 2020-12-03: 2000 mg via TOPICAL

## 2020-12-03 MED ORDER — ACETAMINOPHEN 325 MG PO TABS: 325.0000 mg | ORAL_TABLET | ORAL | Status: DC | PRN

## 2020-12-03 MED ORDER — HYDROMORPHONE HCL 1 MG/ML IJ SOLN: 0.5000 mg | INTRAMUSCULAR | Status: DC | PRN

## 2020-12-03 MED ORDER — METHOCARBAMOL 500 MG PO TABS
500.0000 mg | ORAL_TABLET | Freq: Four times a day (QID) | ORAL | Status: DC | PRN
  Administered 2020-12-04: 500 mg via ORAL
  Filled 2020-12-03: qty 1

## 2020-12-03 MED ORDER — PHENYLEPHRINE HCL (PRESSORS) 10 MG/ML IV SOLN
INTRAVENOUS | Status: DC | PRN
  Administered 2020-12-03 (×8): 100 ug via INTRAVENOUS

## 2020-12-03 MED ORDER — CHLORHEXIDINE GLUCONATE 0.12 % MT SOLN
15.0000 mL | Freq: Once | OROMUCOSAL | Status: AC
  Administered 2020-12-03: 15 mL via OROMUCOSAL
  Filled 2020-12-03: qty 15

## 2020-12-03 MED ORDER — MENTHOL 3 MG MT LOZG: 1.0000 | LOZENGE | OROMUCOSAL | Status: DC | PRN

## 2020-12-03 MED ORDER — FENTANYL CITRATE (PF) 100 MCG/2ML IJ SOLN
INTRAMUSCULAR | Status: AC
  Filled 2020-12-03: qty 2

## 2020-12-03 MED ORDER — OXYCODONE HCL 5 MG PO TABS
5.0000 mg | ORAL_TABLET | Freq: Once | ORAL | Status: AC | PRN
  Administered 2020-12-03: 5 mg via ORAL

## 2020-12-03 MED ORDER — SODIUM CHLORIDE 0.9 % IR SOLN
Status: DC | PRN
  Administered 2020-12-03: 1000 mL

## 2020-12-03 MED ORDER — VANCOMYCIN HCL IN DEXTROSE 1-5 GM/200ML-% IV SOLN
1000.0000 mg | INTRAVENOUS | Status: AC
  Administered 2020-12-03: 1000 mg via INTRAVENOUS
  Filled 2020-12-03: qty 200

## 2020-12-03 MED ORDER — SORBITOL 70 % SOLN
30.0000 mL | Freq: Every day | Status: DC | PRN
  Filled 2020-12-03: qty 30

## 2020-12-03 SURGICAL SUPPLY — 69 items
ADH SKN CLS APL DERMABOND .7 (GAUZE/BANDAGES/DRESSINGS) ×1
BAG COUNTER SPONGE SURGICOUNT (BAG) ×2 IMPLANT
BAG DECANTER FOR FLEXI CONT (MISCELLANEOUS) ×2 IMPLANT
BAG SPNG CNTER NS LX DISP (BAG) ×1
BEARING CROSSLINK RSA 28X44 (Joint) ×2 IMPLANT
BIT DRILL RINGLOC 3.2MMX20 (BIT) ×1 IMPLANT
BIT DRILL RINGLOC 3.2X20 (BIT) ×1
BRNG HIP F 44X28 LUM STRL (Joint) ×1 IMPLANT
CELLS DAT CNTRL 66122 CELL SVR (MISCELLANEOUS) IMPLANT
COVER PERINEAL POST (MISCELLANEOUS) ×2 IMPLANT
COVER SURGICAL LIGHT HANDLE (MISCELLANEOUS) ×2 IMPLANT
DERMABOND ADVANCED (GAUZE/BANDAGES/DRESSINGS) ×1
DERMABOND ADVANCED .7 DNX12 (GAUZE/BANDAGES/DRESSINGS) ×1 IMPLANT
DRAPE C-ARM 42X72 X-RAY (DRAPES) ×2 IMPLANT
DRAPE POUCH INSTRU U-SHP 10X18 (DRAPES) ×2 IMPLANT
DRAPE STERI IOBAN 125X83 (DRAPES) ×2 IMPLANT
DRAPE U-SHAPE 47X51 STRL (DRAPES) ×4 IMPLANT
DRILL BIT RINGLOC 3.2MMX20 (BIT) ×2
DRSG AQUACEL AG ADV 3.5X10 (GAUZE/BANDAGES/DRESSINGS) ×2 IMPLANT
DURAPREP 26ML APPLICATOR (WOUND CARE) ×4 IMPLANT
ELECT BLADE 4.0 EZ CLEAN MEGAD (MISCELLANEOUS) ×2
ELECT REM PT RETURN 9FT ADLT (ELECTROSURGICAL) ×2
ELECTRODE BLDE 4.0 EZ CLN MEGD (MISCELLANEOUS) ×1 IMPLANT
ELECTRODE REM PT RTRN 9FT ADLT (ELECTROSURGICAL) ×1 IMPLANT
FEM HEAD DELTA 28MM -3.5MM (Orthopedic Implant) ×2 IMPLANT
FEMORAL HEAD DELTA 28MM -3.5MM (Orthopedic Implant) ×1 IMPLANT
GLOVE SURG LTX SZ7 (GLOVE) ×4 IMPLANT
GLOVE SURG NEOP MICRO LF SZ7.5 (GLOVE) ×2 IMPLANT
GLOVE SURG SYN 7.5  E (GLOVE) ×8
GLOVE SURG SYN 7.5 E (GLOVE) ×4 IMPLANT
GLOVE SURG UNDER POLY LF SZ7 (GLOVE) ×10 IMPLANT
GOWN STRL REIN XL XLG (GOWN DISPOSABLE) ×2 IMPLANT
GOWN STRL REUS W/ TWL LRG LVL3 (GOWN DISPOSABLE) IMPLANT
GOWN STRL REUS W/ TWL XL LVL3 (GOWN DISPOSABLE) ×1 IMPLANT
GOWN STRL REUS W/TWL LRG LVL3 (GOWN DISPOSABLE)
GOWN STRL REUS W/TWL XL LVL3 (GOWN DISPOSABLE) ×2
HANDPIECE INTERPULSE COAX TIP (DISPOSABLE) ×2
HOOD PEEL AWAY FLYTE STAYCOOL (MISCELLANEOUS) ×4 IMPLANT
IV NS IRRIG 3000ML ARTHROMATIC (IV SOLUTION) ×2 IMPLANT
JET LAVAGE IRRISEPT WOUND (IRRIGATION / IRRIGATOR) ×2
KIT BASIN OR (CUSTOM PROCEDURE TRAY) ×2 IMPLANT
LAVAGE JET IRRISEPT WOUND (IRRIGATION / IRRIGATOR) ×1 IMPLANT
LINER ACETAB G7 44MM SZF (Liner) ×2 IMPLANT
MARKER SKIN DUAL TIP RULER LAB (MISCELLANEOUS) ×2 IMPLANT
NEEDLE SPNL 18GX3.5 QUINCKE PK (NEEDLE) ×2 IMPLANT
PACK TOTAL JOINT (CUSTOM PROCEDURE TRAY) ×2 IMPLANT
PACK UNIVERSAL I (CUSTOM PROCEDURE TRAY) ×2 IMPLANT
PAD COLD SHLDR WRAP-ON (PAD) ×2 IMPLANT
RTRCTR WOUND ALEXIS 18CM MED (MISCELLANEOUS)
SAW OSC TIP CART 19.5X105X1.3 (SAW) ×2 IMPLANT
SCREW BONE 6.5X20 (Screw) ×2 IMPLANT
SET HNDPC FAN SPRY TIP SCT (DISPOSABLE) ×1 IMPLANT
SHELL ACET G7 4H 56 SZF (Shell) ×2 IMPLANT
STAPLER VISISTAT 35W (STAPLE) IMPLANT
STEM FEM HO CMTLS 12/14 4 (Stem) ×2 IMPLANT
SUT ETHIBOND 2 V 37 (SUTURE) ×2 IMPLANT
SUT ETHILON 2 0 FS 18 (SUTURE) ×6 IMPLANT
SUT VIC AB 0 CT1 27 (SUTURE) ×2
SUT VIC AB 0 CT1 27XBRD ANBCTR (SUTURE) ×1 IMPLANT
SUT VIC AB 1 CTX 36 (SUTURE) ×2
SUT VIC AB 1 CTX36XBRD ANBCTR (SUTURE) ×1 IMPLANT
SUT VIC AB 2-0 CT1 27 (SUTURE) ×4
SUT VIC AB 2-0 CT1 TAPERPNT 27 (SUTURE) ×2 IMPLANT
SYR 50ML LL SCALE MARK (SYRINGE) ×2 IMPLANT
TOWEL GREEN STERILE (TOWEL DISPOSABLE) ×2 IMPLANT
TRAY CATH 16FR W/PLASTIC CATH (SET/KITS/TRAYS/PACK) IMPLANT
TRAY FOLEY W/BAG SLVR 16FR (SET/KITS/TRAYS/PACK) ×2
TRAY FOLEY W/BAG SLVR 16FR ST (SET/KITS/TRAYS/PACK) ×1 IMPLANT
YANKAUER SUCT BULB TIP NO VENT (SUCTIONS) ×2 IMPLANT

## 2020-12-03 NOTE — H&P (Signed)
PREOPERATIVE H&P  Chief Complaint: right hip avascular necrosis  HPI: Shane Cole. is a 40 y.o. male who presents for surgical treatment of right hip avascular necrosis.  He denies any changes in medical history.  Past Medical History:  Diagnosis Date   Allergy    Anxiety    Bipolar disorder (HCC)    Bronchitis    Bronchitis    Crush injury lower leg    Left lower leg   Depression    Gastric ulcer    GERD (gastroesophageal reflux disease)    will awaken him in night   History of hiatal hernia    Hypertension    Migraine    Nerve pain    RSD (reflex sympathetic dystrophy)    Past Surgical History:  Procedure Laterality Date   CHOLECYSTECTOMY N/A 01/24/2014   Procedure: LAPAROSCOPIC CHOLECYSTECTOMY;  Surgeon: Axel Filler, MD;  Location: [facility] OR;  Service: General;  Laterality: N/A;   COLONOSCOPY     HAND SURGERY     MASS EXCISION Left 01/25/2013   Procedure: EXCISION MASS DORSAL ASPECT LEFT LONG FINGER DISTAL INTERPHALANGEAL JOINT;  Surgeon: Wyn Forster., MD;  Location: [facility];  Service: Orthopedics;  Laterality: Left;  Left long    MOUTH SURGERY     Social History   Socioeconomic History   Marital status: Married    Spouse name: Not on file   Number of children: 1   Years of education: 10th grade   Highest education level: Not on file  Occupational History   Occupation: Disabled    Comment: Previously worked Web designer  Tobacco Use   Smoking status: Every Day    Packs/day: 0.25    Years: 23.00    Pack years: 5.75    Types: Cigarettes   Smokeless tobacco: Never   Tobacco comments:    Using nicotine patch  Vaping Use   Vaping Use: Never used  Substance and Sexual Activity   Alcohol use: Yes    Alcohol/week: 6.0 standard drinks    Types: 6 Cans of beer per week    Comment: occasional   Drug use: No   Sexual activity: Yes    Partners: Female  Other Topics Concern   Not on file  Social History  Narrative   05/24/12  Shane Cole was born and grew up in [place], [place].    He has 2 sisters.    He reports that his childhood was "lousy."    He completed the 10th grade.    He has been married for 5 years, and is currently separated for 3 weeks.    He has a daughter who is 67-1/2 years old.    He has been unemployed for 2 years, and is disabled due to an on-the-job work accident.    He is currently living with his aunt and cousin.    He reports that his hobbies are sports and dogs.    He reports that he is spiritual, but not religious.    He states that his aunt and his father are his social support system.    He denies any current legal problems, but got a DUI in 2007. 05/24/12 AHW      Social Determinants of Health   Financial Resource Strain: Not on file  Food Insecurity: Not on file  Transportation Needs: Not on file  Physical Activity: Not on file  Stress: Not on file  Social Connections: Not on  file   Family History  Problem Relation Age of Onset   Hyperlipidemia Father    Hypertension Father    Anxiety disorder Father    Drug abuse Father    Cancer Paternal Grandfather        lung, colon   Colon cancer Paternal Grandfather    Lung cancer Paternal Grandfather    Diabetes Paternal Grandmother    Cancer Paternal Grandmother    Cancer Maternal Grandmother    Cancer Maternal Grandfather    Lung cancer Maternal Grandfather    Stroke Other    Heart disease Other    Depression Paternal Aunt    Anxiety disorder Paternal Aunt    Drug abuse Paternal Uncle    Colon cancer Paternal Uncle    Esophageal cancer Neg Hx    Stomach cancer Neg Hx    Rectal cancer Neg Hx    Allergies  Allergen Reactions   Aspirin Shortness Of Breath and Tinitus    Tinnitus, dizzy, short of breath   Codeine Itching    Reaction to tylenol #3   Penicillins Anaphylaxis and Other (See Comments)    Was told never to take medication.  Has patient had a PCN reaction causing immediate rash,  facial/tongue/throat swelling, SOB or lightheadedness with hypotension: no Has patient had a PCN reaction causing severe rash involving mucus membranes or skin necrosis: no Has patient had a PCN reaction that required hospitalization: no Has patient had a PCN reaction occurring within the last 10 years: no If all of the above answers are "NO", then may proceed with Cephalosporin use.    Tramadol     seizure    Amitriptyline Rash   Prior to Admission medications   Medication Sig Start Date End Date Taking? Authorizing Provider  diphenhydrAMINE (BENADRYL) 25 MG tablet Take 25 mg by mouth every 6 (six) hours as needed for allergies.   Yes [provider]  famotidine (PEPCID) 20 MG tablet Take 20 mg by mouth daily as needed for heartburn.   Yes [provider]  gabapentin (NEURONTIN) 300 MG capsule Take 1 capsule (300 mg total) by mouth 3 (three) times daily. 11/27/20  Yes Nafziger, Kandee Keen, NP  lidocaine (LIDODERM) 5 % Place 1 patch onto the skin daily. Remove & Discard patch within 12 hours or as directed by MD   Yes [provider]  lidocaine (XYLOCAINE) 5 % ointment Apply 1 application topically as needed for moderate pain. 09/26/20  Yes Nafziger, Kandee Keen, NP  Lidocaine 3 % CREA Apply 1 application topically daily as needed (pain).   Yes [provider]  Multiple Vitamin (MULTIVITAMIN WITH MINERALS) TABS tablet Take 1 tablet by mouth daily.   Yes [provider]  nicotine (NICODERM CQ - DOSED IN MG/24 HOURS) 21 mg/24hr patch USE AS DIRECTED 11/29/20  Yes Andrez Grime, RPH  nortriptyline (PAMELOR) 75 MG capsule Take 1 capsule (75 mg total) by mouth at bedtime. 11/27/20  Yes Nafziger, Kandee Keen, NP  ondansetron (ZOFRAN ODT) 4 MG disintegrating tablet Dissolve 1 tablet (4 mg total) by mouth every 8 (eight) hours as needed for nausea or vomiting. 07/25/20  Yes Nafziger, Kandee Keen, NP  pantoprazole (PROTONIX) 40 MG tablet Take 1 tablet twice daily 30 to 60 minutes before  breakfast and dinner. Patient taking differently: Take 40 mg by mouth in the morning. 08/03/20  Yes Unk Lightning, PA  sildenafil (REVATIO) 20 MG tablet TAKE 1 TABLET BY MOUTH 2 TIMES DAILY 08/07/20 08/07/21 Yes Nafziger, Kandee Keen, NP  docusate sodium (COLACE) 100 MG capsule Take 1 capsule (100 mg total) by mouth daily as needed. To be taken after surgery 11/28/20 11/28/21  Cristie Hem, PA-C  methocarbamol (ROBAXIN) 500 MG tablet Take 1 tablet (500 mg total) by mouth 2 (two) times daily as needed. To be taken after surgery 11/28/20   Cristie Hem, PA-C  Nerve Stimulator (TENS THERAPY PAIN RELIEF) DEVI 1 application by Does not apply route daily as needed (chronic pain).    [provider]  nicotine polacrilex (NICORETTE) 4 MG gum USE AS DIRECTED 11/29/20   Andrez Grime, RPH  ondansetron (ZOFRAN) 4 MG tablet Take 1 tablet (4 mg total) by mouth every 8 (eight) hours as needed for nausea or vomiting. To be taken after surgery 11/28/20   Cristie Hem, PA-C  Oxycodone HCl 10 MG TABS Take 1 tablet (10 mg total) by mouth every 6 (six) hours. 11/27/20 12/27/20  Nafziger, Kandee Keen, NP  oxyCODONE-acetaminophen (PERCOCET) 7.5-325 MG tablet Take 1 tablet by mouth every 6 (six) hours as needed for severe pain. To be taken after surgery 11/28/20 11/28/21  Cristie Hem, PA-C  rivaroxaban (XARELTO) 10 MG TABS tablet Take 1 tablet (10 mg total) by mouth daily. To be taken after surgery 11/28/20   Cristie Hem, PA-C     Positive ROS: All other systems have been reviewed and were otherwise negative with the exception of those mentioned in the HPI and as above.  Physical Exam: General: Alert, no acute distress Cardiovascular: No pedal edema Respiratory: No cyanosis, no use of accessory musculature GI: abdomen soft Skin: No lesions in the area of chief complaint Neurologic: Sensation intact distally Psychiatric: Patient is competent for consent with normal mood and affect Lymphatic: no  lymphedema  MUSCULOSKELETAL: exam stable  Assessment: right hip avascular necrosis  Plan: Plan for Procedure(s): TOTAL HIP ARTHROPLASTY ANTERIOR APPROACH  The risks benefits and alternatives were discussed with the patient including but not limited to the risks of nonoperative treatment, versus surgical intervention including infection, bleeding, nerve injury,  blood clots, cardiopulmonary complications, morbidity, mortality, among others, and they were willing to proceed.   Preoperative templating of the joint replacement has been completed, documented, and submitted to the Operating Room personnel in order to optimize intra-operative equipment management.   Glee Arvin, MD 12/03/2020 8:54 AM

## 2020-12-03 NOTE — Anesthesia Postprocedure Evaluation (Signed)
Anesthesia Post Note  Patient: Shane Cole.  Procedure(s) Performed: TOTAL HIP ARTHROPLASTY ANTERIOR APPROACH (Right: Hip)     Patient location during evaluation: PACU Anesthesia Type: MAC Level of consciousness: oriented and awake and alert Pain management: pain level controlled Vital Signs Assessment: post-procedure vital signs reviewed and stable Respiratory status: spontaneous breathing, respiratory function stable and patient connected to nasal cannula oxygen Cardiovascular status: blood pressure returned to baseline and stable Postop Assessment: no headache, no backache and no apparent nausea or vomiting Anesthetic complications: no   No notable events documented.  Last Vitals:  Vitals:   12/03/20 1200 12/03/20 1214  BP: 103/69 101/64  Pulse: 81 82  Resp: 13 17  Temp: 36.7 C   SpO2: 98% 98%    Last Pain:  Vitals:   12/03/20 1214  TempSrc:   PainSc: 0-No pain                 Cisco Kindt

## 2020-12-03 NOTE — Anesthesia Preprocedure Evaluation (Addendum)
Anesthesia Evaluation  Patient identified by MRN, date of birth, ID band Patient awake    Reviewed: Allergy & Precautions, H&P , NPO status , Patient's Chart, lab work & pertinent test results  History of Anesthesia Complications Negative for: history of anesthetic complications  Airway Mallampati: I  TM Distance: >3 FB Neck ROM: Full    Dental  (+) Edentulous Upper, Edentulous Lower   Pulmonary Current Smoker,    breath sounds clear to auscultation       Cardiovascular hypertension, negative cardio ROS   Rhythm:Regular Rate:Normal     Neuro/Psych  Headaches, PSYCHIATRIC DISORDERS Anxiety Depression Bipolar Disorder Chronic leg pain/neuropathy  Neuromuscular disease    GI/Hepatic Neg liver ROS, hiatal hernia, PUD, GERD  Medicated and Poorly Controlled,  Endo/Other  Morbid obesity  Renal/GU negative Renal ROS     Musculoskeletal  (+) Arthritis , Osteoarthritis,    Abdominal (+) + obese,   Peds  Hematology   Anesthesia Other Findings   Reproductive/Obstetrics                            Anesthesia Physical  Anesthesia Plan  ASA: 3  Anesthesia Plan: MAC and Spinal   Post-op Pain Management:    Induction: Intravenous  PONV Risk Score and Plan: 1 and Propofol infusion  Airway Management Planned: Natural Airway  Additional Equipment: None  Intra-op Plan:   Post-operative Plan: Extubation in OR  Informed Consent: I have reviewed the patients History and Physical, chart, labs and discussed the procedure including the risks, benefits and alternatives for the proposed anesthesia with the patient or authorized representative who has indicated his/her understanding and acceptance.       Plan Discussed with: CRNA and Anesthesiologist  Anesthesia Plan Comments:        Anesthesia Quick Evaluation

## 2020-12-03 NOTE — Discharge Instructions (Signed)

## 2020-12-03 NOTE — Op Note (Signed)
TOTAL HIP ARTHROPLASTY ANTERIOR APPROACH  Procedure Note Shane Cole   [id number]  Pre-op Diagnosis: right hip avascular necrosis     Post-op Diagnosis: same   Operative Procedures  1. Total hip replacement; Right hip; uncemented [id number]   Surgeon: Gershon Mussel, M.D.  Assist: Oneal Grout, PA-C   Anesthesia: spinal  Prosthesis: Zimmer Acetabulum: G7 56 mm Femur: Avanear plus 4 HO Head: 44/28 mm size: -3.5 Liner: neutral Bearing Type: dual mobility  Total Hip Arthroplasty (Anterior Approach) Op Note:  After informed consent was obtained and the operative extremity marked in the holding area, the patient was brought back to the operating room and placed supine on the HANA table. Next, the operative extremity was prepped and draped in normal sterile fashion. Surgical timeout occurred verifying patient identification, surgical site, surgical procedure and administration of antibiotics.  A modified anterior Smith-Peterson approach to the hip was performed, using the interval between tensor fascia lata and sartorius.  Dissection was carried bluntly down onto the anterior hip capsule. The lateral femoral circumflex vessels were identified and coagulated. A capsulotomy was performed and the capsular flaps tagged for later repair.  The neck osteotomy was performed. The femoral head was removed which showed findings consistent with AVN, the acetabular rim was cleared of soft tissue and attention was turned to reaming the acetabulum.  Sequential reaming was performed under fluoroscopic guidance. We reamed to a size 55 mm, and then impacted the acetabular shell. A 20 mm cancellous screw was placed through the shell for added fixation.  The liner was then placed after irrigation and attention turned to the femur.  After placing the femoral hook, the leg was taken to externally rotated, extended and adducted position taking care to perform soft tissue releases to allow for adequate  mobilization of the femur. Soft tissue was cleared from the shoulder of the greater trochanter and the hook elevator used to improve exposure of the proximal femur. Sequential broaching performed up to a size 4. Trial neck and head were placed. The leg was brought back up to neutral and the construct reduced.  Antibiotic irrigation was placed in the surgical wound and kept for at least 1 minute.  The position and sizing of components, offset and leg lengths were checked using fluoroscopy. Stability of the construct was checked in extension and external rotation without any subluxation or impingement of prosthesis. We dislocated the prosthesis, dropped the leg back into position, removed trial components, and irrigated copiously. The final stem and head was then placed, the leg brought back up, the system reduced and fluoroscopy used to verify positioning.  We irrigated, obtained hemostasis and closed the capsule using #2 ethibond suture.  One gram of vancomycin powder was placed in the surgical bed.   One gram of topical tranexamic acid was injected into the joint.  The fascia was closed with #1 vicryl plus, the deep fat layer was closed with 0 vicryl, the subcutaneous layers closed with 2.0 Vicryl Plus and the skin closed with 2.0 nylon and dermabond. A sterile dressing was applied. The patient was awakened in the operating room and taken to recovery in stable condition.  All sponge, needle, and instrument counts were correct at the end of the case.   Tessa Lerner, my PA, was a medical necessity for opening, closing, limb positioning, retracting, exposing, and overall facilitation and timely completion of the surgery.  Position: supine  Complications: see description of procedure.  Time Out: performed   Drains/Packing: none  Estimated blood loss: see anesthesia record  Returned to Recovery Room: in good condition.   Antibiotics: yes   Mechanical VTE (DVT) Prophylaxis: sequential compression  devices, TED thigh-high  Chemical VTE (DVT) Prophylaxis: eliquis   Fluid Replacement: see anesthesia record  Specimens Removed: 1 to pathology   Sponge and Instrument Count Correct? yes   PACU: portable radiograph - low AP   Plan/RTC: Return in 2 weeks for staple removal. Weight Bearing/Load Lower Extremity: full  Hip precautions: none Suture Removal: 2 weeks   N. Glee Arvin, MD [facility] 11:33 AM   Implant Name Type Inv. Item Serial No. Manufacturer Lot No. LRB No. Used Action  SHELL ACET G7 4H 56 SZF - [id number] Shell SHELL ACET G7 4H 56 SZF  ZIMMER RECON(ORTH,TRAU,BIO,SG) [id number] Right 1 Implanted  SCREW BONE 6.5X20 - [id number] Screw SCREW BONE 6.5X20  ZIMMER RECON(ORTH,TRAU,BIO,SG) [id number] Right 1 Implanted  LINER ACETAB G7 SZF - [id number] Liner LINER ACETAB G7 SZF  ZIMMER RECON(ORTH,TRAU,BIO,SG) [id number] Right 1 Implanted  Femoral Stem Cementless Collared high Offset 12/14 Taper Size 4 Stem   Zimmer [id number] Right 1 Implanted  BEARING CROSSLINK RSA 28X44 - [id number] Joint BEARING CROSSLINK RSA 28X44  ZIMMER RECON(ORTH,TRAU,BIO,SG) [id number] Right 1 Implanted  FEM HEAD DELTA -3.5MM - [id number] Orthopedic Implant FEM HEAD DELTA -3.5MM  ZIMMER RECON(ORTH,TRAU,BIO,SG) [id number] Right 1 Implanted

## 2020-12-03 NOTE — Evaluation (Signed)
Physical Therapy Evaluation Patient Details Name: Shane Cole. MRN: [id number] DOB: May 17, 1980 Today's Date: 12/03/2020  History of Present Illness  Pt is 40 yo male s/p R anterior THA on 12/03/20.  Pt with hx including R hip AVN, bipolar, L lower leg crush injury, HTN, and nerve pain.  Clinical Impression  Pt is s/p THA resulting in the deficits listed below (see PT Problem List). At baseline, pt is independent and working (out of work since July due to hip).  He has good family support.  At evaluation, pt progressing well for DOS.  He was able to ambulate 77' with RW, min A for transfers, and fair to good pain control.  Pt expected to progress very well.  Pt will benefit from skilled PT to increase their independence and safety with mobility to allow discharge to the venue listed below.         Recommendations for follow up therapy are one component of a multi-disciplinary discharge planning process, led by the attending physician.  Recommendations may be updated based on patient status, additional functional criteria and insurance authorization.  Follow Up Recommendations Follow surgeon's recommendation for DC plan and follow-up therapies;Supervision for mobility/OOB    Equipment Recommendations  Rolling walker with 5" wheels    Recommendations for Other Services       Precautions / Restrictions Precautions Precautions: Fall Restrictions LLE Weight Bearing: Weight bearing as tolerated      Mobility  Bed Mobility Overal bed mobility: Needs Assistance Bed Mobility: Supine to Sit     Supine to sit: Min assist     General bed mobility comments: min A for R LE    Transfers Overall transfer level: Needs assistance Equipment used: Rolling walker (2 wheeled) Transfers: Sit to/from Stand Sit to Stand: Min guard         General transfer comment: cues for hand placement and R LE managment  Ambulation/Gait Ambulation/Gait assistance: Min guard Gait Distance (Feet):  75 Feet Assistive device: Rolling walker (2 wheeled) Gait Pattern/deviations: Step-to pattern;Decreased stride length;Decreased weight shift to right Gait velocity: decreased   General Gait Details: Cues for RW use and proximity  Stairs            Wheelchair Mobility    Modified Rankin (Stroke Patients Only)       Balance Overall balance assessment: Needs assistance Sitting-balance support: No upper extremity supported Sitting balance-Leahy Scale: Good     Standing balance support: Bilateral upper extremity supported;No upper extremity supported Standing balance-Leahy Scale: Fair Standing balance comment: RW to ambulate but could static stand without AD                             Pertinent Vitals/Pain Pain Assessment: 0-10 Pain Score:  (6/10 pre; 4/10 post) Pain Location: R hip Pain Descriptors / Indicators: Discomfort;Sore Pain Intervention(s): Limited activity within patient's tolerance;Premedicated before session;Monitored during session;Repositioned    Home Living Family/patient expects to be discharged to:: Private residence Living Arrangements: Spouse/significant other;Children Available Help at Discharge: Family;Available 24 hours/day Type of Home: House Home Access: Stairs to enter Entrance Stairs-Rails: Right Entrance Stairs-Number of Steps: 4 Home Layout: One level Home Equipment: Bedside commode;Cane - single point      Prior Function Level of Independence: Independent               Hand Dominance        Extremity/Trunk Assessment   Upper Extremity Assessment Upper Extremity Assessment:  Overall WFL for tasks assessed    Lower Extremity Assessment Lower Extremity Assessment: RLE deficits/detail;LLE deficits/detail RLE Deficits / Details: Expected post op changes; ROM: WFL; MMT: hip 1/5, knee 3/5, ankle 5/5 LLE Deficits / Details: ROM normal; MMT 5/5    Cervical / Trunk Assessment Cervical / Trunk Assessment: Normal   Communication   Communication: No difficulties  Cognition Arousal/Alertness: Awake/alert Behavior During Therapy: WFL for tasks assessed/performed Overall Cognitive Status: Within Functional Limits for tasks assessed                                        General Comments General comments (skin integrity, edema, etc.): VSS; encouraged to perform ankle pumps tonight and provided/educated on gait belt to assist with bed mobility    Exercises     Assessment/Plan    PT Assessment Patient needs continued PT services  PT Problem List Decreased strength;Decreased mobility;Decreased range of motion;Decreased activity tolerance;Decreased balance;Decreased knowledge of use of DME;Pain       PT Treatment Interventions DME instruction;Therapeutic activities;Modalities;Gait training;Therapeutic exercise;Patient/family education;Stair training;Balance training;Functional mobility training    PT Goals (Current goals can be found in the Care Plan section)  Acute Rehab PT Goals Patient Stated Goal: return home PT Goal Formulation: With patient/family Time For Goal Achievement: 12/17/20 Potential to Achieve Goals: Good    Frequency 7X/week   Barriers to discharge        Co-evaluation               AM-PAC PT "6 Clicks" Mobility  Outcome Measure Help needed turning from your back to your side while in a flat bed without using bedrails?: A Little Help needed moving from lying on your back to sitting on the side of a flat bed without using bedrails?: A Little Help needed moving to and from a bed to a chair (including a wheelchair)?: A Little Help needed standing up from a chair using your arms (e.g., wheelchair or bedside chair)?: A Little Help needed to walk in hospital room?: A Little Help needed climbing 3-5 steps with a railing? : A Little 6 Click Score: 18    End of Session Equipment Utilized During Treatment: Gait belt Activity Tolerance: Patient tolerated  treatment well Patient left: in chair;with call bell/phone within reach (A and O x 4; following all commands) Nurse Communication: Mobility status PT Visit Diagnosis: Muscle weakness (generalized) (M62.81);Other abnormalities of gait and mobility (R26.89)    Time: 6415-8309 PT Time Calculation (min) (ACUTE ONLY): 22 min   Charges:   PT Evaluation $PT Eval Low Complexity: 1 Low          Giovany Cosby, PT Acute Rehab Services Pager [phone] [facility] [phone]   Rayetta Humphrey 12/03/2020, 5:44 PM

## 2020-12-03 NOTE — Telephone Encounter (Signed)
I sent in both so he can choose based on cost if he wishes

## 2020-12-03 NOTE — Telephone Encounter (Signed)
Just sent in the eliquis per earlier message.  I have a feeling this will be equally if not more expensive than the xarelto though

## 2020-12-03 NOTE — Transfer of Care (Signed)
Immediate Anesthesia Transfer of Care Note  Patient: Shane Cole.  Procedure(s) Performed: TOTAL HIP ARTHROPLASTY ANTERIOR APPROACH (Right: Hip)  Patient Location: PACU  Anesthesia Type:MAC and Spinal  Level of Consciousness: awake and alert   Airway & Oxygen Therapy: Patient Spontanous Breathing  Post-op Assessment: Report given to RN  Post vital signs: Reviewed and stable  Last Vitals:  Vitals Value Taken Time  BP 103/69 12/03/20 1200  Temp    Pulse 81 12/03/20 1200  Resp 13 12/03/20 1200  SpO2 98 % 12/03/20 1200    Last Pain:  Vitals:   12/03/20 0828  TempSrc:   PainSc: 4       Patients Stated Pain Goal: 4 (58/83/25 4982)  Complications: No notable events documented.

## 2020-12-03 NOTE — Telephone Encounter (Signed)
Ok, if so then we can do lovenox

## 2020-12-03 NOTE — Anesthesia Procedure Notes (Signed)
Spinal  Patient location during procedure: OR Start time: 12/03/2020 9:58 AM End time: 12/03/2020 10:01 AM Reason for block: surgical anesthesia Staffing Anesthesiologist: Bethena Midget, MD Preanesthetic Checklist Completed: patient identified, IV checked, site marked, risks and benefits discussed, surgical consent, monitors and equipment checked, pre-op evaluation and timeout performed Spinal Block Patient position: sitting Prep: DuraPrep Patient monitoring: heart rate, cardiac monitor, continuous pulse ox and blood pressure Approach: midline Location: L3-4 Injection technique: single-shot Needle Needle type: Sprotte  Needle gauge: 24 G Needle length: 9 cm Assessment Sensory level: T4 Events: CSF return

## 2020-12-04 ENCOUNTER — Other Ambulatory Visit (HOSPITAL_COMMUNITY): Payer: Self-pay

## 2020-12-04 ENCOUNTER — Encounter (HOSPITAL_COMMUNITY): Payer: Self-pay | Admitting: Orthopaedic Surgery

## 2020-12-04 ENCOUNTER — Other Ambulatory Visit: Payer: Self-pay

## 2020-12-04 DIAGNOSIS — M87051 Idiopathic aseptic necrosis of right femur: Secondary | ICD-10-CM | POA: Diagnosis not present

## 2020-12-04 LAB — CBC
HCT: 40.2 % (ref 39.0–52.0)
Hemoglobin: 13.8 g/dL (ref 13.0–17.0)
MCH: 30.9 pg (ref 26.0–34.0)
MCHC: 34.3 g/dL (ref 30.0–36.0)
MCV: 90.1 fL (ref 80.0–100.0)
Platelets: 215 10*3/uL (ref 150–400)
RBC: 4.46 MIL/uL (ref 4.22–5.81)
RDW: 11.7 % (ref 11.5–15.5)
WBC: 18.6 10*3/uL — ABNORMAL HIGH (ref 4.0–10.5)
nRBC: 0 % (ref 0.0–0.2)

## 2020-12-04 LAB — BASIC METABOLIC PANEL
Anion gap: 10 (ref 5–15)
BUN: 13 mg/dL (ref 6–20)
CO2: 24 mmol/L (ref 22–32)
Calcium: 8.9 mg/dL (ref 8.9–10.3)
Chloride: 101 mmol/L (ref 98–111)
Creatinine, Ser: 0.84 mg/dL (ref 0.61–1.24)
GFR, Estimated: >60 mL/min (ref >60)
Glucose, Bld: 117 mg/dL — ABNORMAL HIGH (ref 70–99)
Potassium: 4 mmol/L (ref 3.5–5.1)
Sodium: 135 mmol/L (ref 135–145)

## 2020-12-04 NOTE — Progress Notes (Signed)
Discharge instructions addressed;Pt in stable condition;Pt.'s wife in room and will be his ride home. 

## 2020-12-04 NOTE — TOC Transition Note (Signed)
Transition of Care [facility]) - CM/SW Discharge Note   Patient Details  Name: Shane Cole. MRN: [id number] Date of Birth: [birth year]  [facility] CM/SW Contact:  Epifanio Lesches, RN Phone Number: 12/04/2020, 10:42 AM   Clinical Narrative:     Patient will DC to: home  Anticipated DC date: 12/04/2020 Family notified: yes  Transport by: Car                      s/p R THA on 12/03/20  Per MD patient ready for DC today. RN, patient, and patient's wife,  notified of DC. Home health orders noted for HHPT. Pt agreeable to home health services. Pt without preference. Referral made with [facility] and accepted. Referral made with [facility] for rolling walker. Equipment will be delivered to bedside prior to discharge Post hospital f/u noted on AVS.  Pt without Rx med concerns.  Post hospital f/u noted on AVS Wife to provide transportation to home.  RNCM will sign off for now as intervention is no longer needed. Please consult [place] again if new needs arise.  Final next level of care: Home w Home Health Services Barriers to Discharge: No Barriers Identified   Patient Goals and CMS Choice     Choice offered to / list presented to : Patient  Discharge Placement                       Discharge Plan and Services                DME Arranged: Walker rolling DME Agency: [facility] Date DME Agency Contacted: 12/04/20 Time DME Agency Contacted: 1041 Representative spoke with at DME Agency: [place] [facility] HH Arranged: PT HH Agency: [facility] Date HH Agency Contacted: 12/04/20 Time HH Agency Contacted: 1041 Representative spoke with at [facility] [place] Agency: (P) [facility]  Social Determinants of Health (SDOH) Interventions     Readmission Risk Interventions No flowsheet data found.

## 2020-12-04 NOTE — Progress Notes (Signed)
Subjective: 1 Day Post-Op Procedure(s) (LRB): TOTAL HIP ARTHROPLASTY ANTERIOR APPROACH (Right) Patient reports pain as mild.    Objective: Vital signs in last 24 hours: Temp:  [97.8 F (36.6 C)-98.6 F (37 C)] 97.8 F (36.6 C) (09/13 0700) Pulse Rate:  [67-114] 74 (09/13 0700) Resp:  [11-28] 18 (09/13 0700) BP: (101-178)/(61-95) 108/61 (09/13 0700) SpO2:  [91 %-100 %] 98 % (09/13 0700) Weight:  [100.7 kg] 100.7 kg (09/12 0810)  Intake/Output from previous day: 09/12 0701 - 09/13 0700 In: 1240 [P.O.:240; I.V.:1000] Out: 1300 [Urine:1000; Blood:300] Intake/Output this shift: No intake/output data recorded.  Recent Labs    12/03/20 1629 12/04/20 0222  HGB 14.7 13.8   Recent Labs    12/03/20 1629 12/04/20 0222  WBC 15.3* 18.6*  RBC 4.88 4.46  HCT 44.4 40.2  PLT 241 215   Recent Labs    12/03/20 1629 12/04/20 0222  NA  --  135  K  --  4.0  CL  --  101  CO2  --  24  BUN  --  13  CREATININE 1.29* 0.84  GLUCOSE  --  117*  CALCIUM  --  8.9   No results for input(s): LABPT, INR in the last 72 hours.  Neurologically intact Neurovascular intact Sensation intact distally Intact pulses distally Dorsiflexion/Plantar flexion intact Incision: dressing C/D/I No cellulitis present Compartment soft   Assessment/Plan: 1 Day Post-Op Procedure(s) (LRB): TOTAL HIP ARTHROPLASTY ANTERIOR APPROACH (Right) Advance diet Up with therapy D/C IV fluids Discharge home with home health WBAT RLE ABLA- mild and stable D/c on eliquis as he was able to find a coupon F/u with Dr. Roda Shutters 2 weeks post-op      Cristie Hem 12/04/2020, 8:03 AM

## 2020-12-04 NOTE — Discharge Summary (Signed)
Patient ID: Shane Cole. MRN: [id number] DOB/AGE: 41-Oct-1982 41 y.o.  Admit date: 12/03/2020 Discharge date: 12/04/2020  Admission Diagnoses:  Principal Problem:   Avascular necrosis of hip (Right) (HCC) Active Problems:   Status post total replacement of right hip   Discharge Diagnoses:  Same  Past Medical History:  Diagnosis Date   Allergy    Anxiety    Bipolar disorder (HCC)    Bronchitis    Bronchitis    Crush injury lower leg    Left lower leg   Depression    Gastric ulcer    GERD (gastroesophageal reflux disease)    will awaken him in night   History of hiatal hernia    Hypertension    Migraine    Nerve pain    RSD (reflex sympathetic dystrophy)     Surgeries: Procedure(s): TOTAL HIP ARTHROPLASTY ANTERIOR APPROACH on 12/03/2020   Consultants:   Discharged Condition: Improved  Hospital Course: Shane Cole. is an 40 y.o. male who was admitted 12/03/2020 for operative treatment ofAvascular necrosis of hip, right (HCC). Patient has severe unremitting pain that affects sleep, daily activities, and work/hobbies. After pre-op clearance the patient was taken to the operating room on 12/03/2020 and underwent  Procedure(s): TOTAL HIP ARTHROPLASTY ANTERIOR APPROACH.    Patient was given perioperative antibiotics:  Anti-infectives (From admission, onward)    Start     Dose/Rate Route Frequency Ordered Stop   12/03/20 2000  vancomycin (VANCOREADY) IVPB 1500 mg/300 mL        1,500 mg 150 mL/hr over 120 Minutes Intravenous Every 12 hours 12/03/20 1503 12/04/20 1959   12/03/20 1053  vancomycin (VANCOCIN) powder  Status:  Discontinued          As needed 12/03/20 1055 12/03/20 1156   12/03/20 0800  vancomycin (VANCOCIN) IVPB 1000 mg/200 mL premix        1,000 mg 200 mL/hr over 60 Minutes Intravenous On call to O.R. 12/03/20 0757 12/03/20 0942        Patient was given sequential compression devices, early ambulation, and chemoprophylaxis to prevent  DVT.  Patient benefited maximally from hospital stay and there were no complications.    Recent vital signs: Patient Vitals for the past 24 hrs:  BP Temp Temp src Pulse Resp SpO2 Height Weight  12/04/20 0700 108/61 97.8 F (36.6 C) Oral 74 18 98 % -- --  12/03/20 2059 131/88 98.6 F (37 C) Oral 92 16 98 % -- --  12/03/20 1456 115/66 98.3 F (36.8 C) Oral 86 20 99 % -- --  12/03/20 1454 115/78 98 F (36.7 C) Oral -- -- -- -- --  12/03/20 1430 110/89 98.3 F (36.8 C) -- 99 (!) 28 99 % -- --  12/03/20 1415 110/85 -- -- 77 (!) 21 99 % -- --  12/03/20 1400 108/76 -- -- 72 14 100 % -- --  12/03/20 1345 106/73 -- -- 92 18 98 % -- --  12/03/20 1330 112/70 -- -- 67 11 100 % -- --  12/03/20 1316 (!) 115/95 -- -- 81 14 91 % -- --  12/03/20 1300 104/73 -- -- 71 17 100 % -- --  12/03/20 1245 115/75 -- -- 81 19 100 % -- --  12/03/20 1229 106/67 -- -- 74 14 99 % -- --  12/03/20 1214 101/64 -- -- 82 17 98 % -- --  12/03/20 1200 103/69 98.1 F (36.7 C) -- 81 13 98 % -- --  12/03/20 0810 Marland Kitchen)  178/93 98.3 F (36.8 C) Oral (!) 114 20 98 % 5\' 11"  (1.803 m) 100.7 kg     Recent laboratory studies:  Recent Labs    12/03/20 1629 12/04/20 0222  WBC 15.3* 18.6*  HGB 14.7 13.8  HCT 44.4 40.2  PLT 241 215  NA  --  135  K  --  4.0  CL  --  101  CO2  --  24  BUN  --  13  CREATININE 1.29* 0.84  GLUCOSE  --  117*  CALCIUM  --  8.9     Discharge Medications:   Allergies as of 12/04/2020       Reactions   Aspirin Shortness Of Breath, Tinitus   Tinnitus, dizzy, short of breath   Codeine Itching   Reaction to tylenol #3   Penicillins Anaphylaxis, Other (See Comments)   Was told never to take medication.  Has patient had a PCN reaction causing immediate rash, facial/tongue/throat swelling, SOB or lightheadedness with hypotension: no Has patient had a PCN reaction causing severe rash involving mucus membranes or skin necrosis: no Has patient had a PCN reaction that required hospitalization:  no Has patient had a PCN reaction occurring within the last 10 years: no If all of the above answers are "NO", then may proceed with Cephalosporin use.   Tramadol    seizure   Amitriptyline Rash        Medication List     STOP taking these medications    Oxycodone HCl 10 MG Tabs   rivaroxaban 10 MG Tabs tablet Commonly known as: XARELTO       TAKE these medications    apixaban 2.5 MG Tabs tablet Commonly known as: Eliquis Take 1 tablet (2.5 mg total) by mouth 2 (two) times daily.   diphenhydrAMINE 25 MG tablet Commonly known as: BENADRYL Take 25 mg by mouth every 6 (six) hours as needed for allergies.   docusate sodium 100 MG capsule Commonly known as: Colace Take 1 capsule (100 mg total) by mouth daily as needed. To be taken after surgery   famotidine 20 MG tablet Commonly known as: PEPCID Take 20 mg by mouth daily as needed for heartburn.   gabapentin 300 MG capsule Commonly known as: NEURONTIN Take 1 capsule (300 mg total) by mouth 3 (three) times daily.   lidocaine 5 % Commonly known as: LIDODERM Place 1 patch onto the skin daily. Remove & Discard patch within 12 hours or as directed by MD   Lidocaine 3 % Crea Apply 1 application topically daily as needed (pain).   lidocaine 5 % ointment Commonly known as: XYLOCAINE Apply 1 application topically as needed for moderate pain.   methocarbamol 500 MG tablet Commonly known as: Robaxin Take 1 tablet (500 mg total) by mouth 2 (two) times daily as needed. To be taken after surgery   multivitamin with minerals Tabs tablet Take 1 tablet by mouth daily.   nicotine 21 mg/24hr patch Commonly known as: NICODERM CQ - dosed in mg/24 hours USE AS DIRECTED   nicotine polacrilex 4 MG gum Commonly known as: NICORETTE USE AS DIRECTED   nortriptyline 75 MG capsule Commonly known as: PAMELOR Take 1 capsule (75 mg total) by mouth at bedtime.   ondansetron 4 MG disintegrating tablet Commonly known as: Zofran  ODT Dissolve 1 tablet (4 mg total) by mouth every 8 (eight) hours as needed for nausea or vomiting.   ondansetron 4 MG tablet Commonly known as: Zofran Take 1 tablet (4 mg  total) by mouth every 8 (eight) hours as needed for nausea or vomiting. To be taken after surgery   oxyCODONE-acetaminophen 7.5-325 MG tablet Commonly known as: Percocet Take 1 tablet by mouth every 6 (six) hours as needed for severe pain. To be taken after surgery   pantoprazole 40 MG tablet Commonly known as: Protonix Take 1 tablet twice daily 30 to 60 minutes before breakfast and dinner. What changed: when to take this   sildenafil 20 MG tablet Commonly known as: REVATIO TAKE 1 TABLET BY MOUTH 2 TIMES DAILY   TENS Therapy Pain Relief Devi 1 application by Does not apply route daily as needed (chronic pain).               Durable Medical Equipment  (From admission, onward)           Start     Ordered   12/03/20 1504  DME Walker rolling  Once       Question:  Patient needs a walker to treat with the following condition  Answer:  History of hip replacement   12/03/20 1503   12/03/20 1504  DME 3 n 1  Once        12/03/20 1503   12/03/20 1504  DME Bedside commode  Once       Question:  Patient needs a bedside commode to treat with the following condition  Answer:  History of hip replacement   12/03/20 1503            Diagnostic Studies: DG Chest 2 View  Result Date: 11/22/2020 CLINICAL DATA:  Pre-op clearance exam for total hip arthroplasty. EXAM: CHEST - 2 VIEW COMPARISON:  Multiple previous studies dating back to 01/15/2017 FINDINGS: The heart size and mediastinal contours are within normal limits. Both lungs are clear. Nipple shadow overlying left lower lung is unchanged since prior study. The visualized skeletal structures are unremarkable. IMPRESSION: Stable exam.  No active cardiopulmonary disease. Electronically Signed   By: Danae Orleans M.D.   On: 11/22/2020 08:17   DG Pelvis  Portable  Result Date: 12/03/2020 CLINICAL DATA:  Postop film for RIGHT hip arthroplasty. EXAM: PORTABLE PELVIS 1-2 VIEWS COMPARISON:  Intraoperative evaluation of the same date. FINDINGS: Postoperative changes of RIGHT total hip arthroplasty seen in AP projection. No immediate complications noted. Soft tissue changes with small amounts of gas in the local soft tissues around the surgical site. No acute findings of the bony pelvis. IMPRESSION: Postoperative changes of RIGHT total hip arthroplasty. No immediate complications noted. Electronically Signed   By: Donzetta Kohut M.D.   On: 12/03/2020 12:41   DG C-Arm 1-60 Min-No Report  Result Date: 12/03/2020 Fluoroscopy was utilized by the requesting physician.  No radiographic interpretation.   DG HIP OPERATIVE UNILAT WITH PELVIS RIGHT  Result Date: 12/03/2020 CLINICAL DATA:  Right hip replacement EXAM: OPERATIVE RIGHT HIP WITH PELVIS COMPARISON:  10/16/2020 FLUOROSCOPY TIME:  Radiation Exposure Index (as provided by the fluoroscopic device): 4.09 mGy If the device does not provide the exposure index: Fluoroscopy Time:  32 seconds Number of Acquired Images:  2 FINDINGS: Right hip prosthesis is noted in satisfactory position. No acute bony or soft tissue abnormality is noted. IMPRESSION: Status post right hip replacement. Electronically Signed   By: Alcide Clever M.D.   On: 12/03/2020 13:23    Disposition: Discharge disposition: 01-Home or Self Care          Follow-up Information     Tarry Kos, MD. Schedule an appointment  as soon as possible for a visit in 2 week(s).   Specialty: Orthopedic Surgery Contact information: [address] [place] [address] [phone]                  Signed: Cristie Hem 12/04/2020, 8:04 AM

## 2020-12-04 NOTE — Progress Notes (Signed)
Physical Therapy Treatment and Discharge Patient Details Name: Shane Cole. MRN: [id number] DOB: 09/15/1980 Today's Date: 12/04/2020   History of Present Illness Pt is 40 yo male s/p R anterior THA on 12/03/20.  Pt with hx including R hip AVN, bipolar, L lower leg crush injury, HTN, and nerve pain.    PT Comments    Continuing work on functional mobility and activity tolerance;  Pt overall managing quite well and ambulating with RW without difficulty; stair training complete and car transfer discussed; OK for dc home from PT standpoint   Acute PT goals met; Will dc acute PT   Recommendations for follow up therapy are one component of a multi-disciplinary discharge planning process, led by the attending physician.  Recommendations may be updated based on patient status, additional functional criteria and insurance authorization.  Follow Up Recommendations  Follow surgeon's recommendation for DC plan and follow-up therapies;Supervision for mobility/OOB     Equipment Recommendations  Rolling walker with 5" wheels    Recommendations for Other Services       Precautions / Restrictions Precautions Precautions: None Restrictions LLE Weight Bearing: Weight bearing as tolerated     Mobility  Bed Mobility Overal bed mobility: Modified Independent Bed Mobility: Supine to Sit     Supine to sit: Modified independent (Device/Increase time)          Transfers Overall transfer level: Modified independent Equipment used: Rolling walker (2 wheeled) Transfers: Sit to/from Stand Sit to Stand: Modified independent (Device/Increase time)            Ambulation/Gait Ambulation/Gait assistance: Supervision Gait Distance (Feet): 150 Feet Assistive device: Rolling walker (2 wheeled) Gait Pattern/deviations: Step-through pattern     General Gait Details: Cues for RW use and proximity, as well as to stand tall on R LE in stance   Stairs Stairs: Yes Stairs assistance:  Supervision Stair Management: One rail Left;Alternating pattern;Forwards Number of Stairs: 5 General stair comments: Cues for sequence; able to take a few steps alternating as well; managing stairs well   Wheelchair Mobility    Modified Rankin (Stroke Patients Only)       Balance     Sitting balance-Leahy Scale: Good       Standing balance-Leahy Scale: Fair Standing balance comment: RW to ambulate but could static stand without AD                            Cognition Arousal/Alertness: Awake/alert Behavior During Therapy: WFL for tasks assessed/performed Overall Cognitive Status: Within Functional Limits for tasks assessed                                        Exercises Total Joint Exercises Ankle Circles/Pumps: AROM;Both;20 reps Quad Sets: AROM;Right;10 reps Gluteal Sets: AROM;Both;10 reps Towel Squeeze: AROM;Both;10 reps Heel Slides: Right;10 reps;AROM Hip ABduction/ADduction: AROM;Right;10 reps Bridges: AROM;Both;10 reps    General Comments General comments (skin integrity, edema, etc.): Good carryover of using gait belt to help RLE during bed mobiltiy      Pertinent Vitals/Pain Pain Assessment: 0-10 Pain Score: 4  Pain Location: R hip Pain Descriptors / Indicators: Discomfort;Sore Pain Intervention(s): Monitored during session    Home Living                      Prior Function  PT Goals (current goals can now be found in the care plan section) Acute Rehab PT Goals Patient Stated Goal: return home PT Goal Formulation: With patient/family Time For Goal Achievement: 12/17/20 Potential to Achieve Goals: Good Progress towards PT goals: Progressing toward goals    Frequency    7X/week      PT Plan Current plan remains appropriate    Co-evaluation              AM-PAC PT "6 Clicks" Mobility   Outcome Measure  Help needed turning from your back to your side while in a flat bed without  using bedrails?: None Help needed moving from lying on your back to sitting on the side of a flat bed without using bedrails?: None Help needed moving to and from a bed to a chair (including a wheelchair)?: None Help needed standing up from a chair using your arms (e.g., wheelchair or bedside chair)?: None Help needed to walk in hospital room?: None Help needed climbing 3-5 steps with a railing? : A Little 6 Click Score: 23    End of Session Equipment Utilized During Treatment: Gait belt Activity Tolerance: Patient tolerated treatment well Patient left: in chair;with call bell/phone within reach Nurse Communication: Mobility status PT Visit Diagnosis: Muscle weakness (generalized) (M62.81);Other abnormalities of gait and mobility (R26.89)     Time: 2536-6440 PT Time Calculation (min) (ACUTE ONLY): 34 min  Charges:  $Gait Training: 8-22 mins $Therapeutic Exercise: 8-22 mins                     Roney Marion, PT  Acute Rehabilitation Services Pager [phone] Office [place] 12/04/2020, 1:26 PM

## 2020-12-05 ENCOUNTER — Telehealth: Payer: Self-pay | Admitting: Orthopaedic Surgery

## 2020-12-05 NOTE — Telephone Encounter (Signed)
Allie (PT) from [facility] called requesting verbal orders for home health pt. She is requesting 2X a wkk for 6 wks. Allie phone number is [phone]. Please leave message on secure line if unable to answer.

## 2020-12-05 NOTE — Telephone Encounter (Signed)
Called to approve orders no answer. LMOM with details/approving orders.

## 2020-12-06 ENCOUNTER — Other Ambulatory Visit (HOSPITAL_COMMUNITY): Payer: Self-pay

## 2020-12-07 ENCOUNTER — Emergency Department (HOSPITAL_COMMUNITY)
Admission: EM | Admit: 2020-12-07 | Discharge: 2020-12-07 | Disposition: A | Discharge status: Home / Self Care | Payer: No Typology Code available for payment source | Attending: Student | Admitting: Student

## 2020-12-07 ENCOUNTER — Encounter (HOSPITAL_COMMUNITY): Payer: Self-pay

## 2020-12-07 ENCOUNTER — Emergency Department (HOSPITAL_COMMUNITY): Payer: No Typology Code available for payment source

## 2020-12-07 ENCOUNTER — Other Ambulatory Visit: Payer: Self-pay

## 2020-12-07 DIAGNOSIS — Z5321 Procedure and treatment not carried out due to patient leaving prior to being seen by health care provider: Secondary | ICD-10-CM | POA: Diagnosis not present

## 2020-12-07 DIAGNOSIS — Z96641 Presence of right artificial hip joint: Secondary | ICD-10-CM | POA: Diagnosis not present

## 2020-12-07 DIAGNOSIS — M79604 Pain in right leg: Secondary | ICD-10-CM

## 2020-12-07 DIAGNOSIS — M79661 Pain in right lower leg: Secondary | ICD-10-CM | POA: Insufficient documentation

## 2020-12-07 DIAGNOSIS — R509 Fever, unspecified: Secondary | ICD-10-CM | POA: Insufficient documentation

## 2020-12-07 LAB — CBC WITH DIFFERENTIAL/PLATELET
Abs Immature Granulocytes: 0.05 10*3/uL (ref 0.00–0.07)
Basophils Absolute: 0 10*3/uL (ref 0.0–0.1)
Basophils Relative: 0 %
Eosinophils Absolute: 0.1 10*3/uL (ref 0.0–0.5)
Eosinophils Relative: 1 %
HCT: 37.5 % — ABNORMAL LOW (ref 39.0–52.0)
Hemoglobin: 12.4 g/dL — ABNORMAL LOW (ref 13.0–17.0)
Immature Granulocytes: 1 %
Lymphocytes Relative: 14 %
Lymphs Abs: 1.5 10*3/uL (ref 0.7–4.0)
MCH: 30.4 pg (ref 26.0–34.0)
MCHC: 33.1 g/dL (ref 30.0–36.0)
MCV: 91.9 fL (ref 80.0–100.0)
Monocytes Absolute: 1.1 10*3/uL — ABNORMAL HIGH (ref 0.1–1.0)
Monocytes Relative: 10 %
Neutro Abs: 7.9 10*3/uL — ABNORMAL HIGH (ref 1.7–7.7)
Neutrophils Relative %: 74 %
Platelets: 226 10*3/uL (ref 150–400)
RBC: 4.08 MIL/uL — ABNORMAL LOW (ref 4.22–5.81)
RDW: 11.9 % (ref 11.5–15.5)
WBC: 10.6 10*3/uL — ABNORMAL HIGH (ref 4.0–10.5)
nRBC: 0 % (ref 0.0–0.2)

## 2020-12-07 LAB — BASIC METABOLIC PANEL
Anion gap: 8 (ref 5–15)
BUN: 11 mg/dL (ref 6–20)
CO2: 28 mmol/L (ref 22–32)
Calcium: 8.7 mg/dL — ABNORMAL LOW (ref 8.9–10.3)
Chloride: 95 mmol/L — ABNORMAL LOW (ref 98–111)
Creatinine, Ser: 1.03 mg/dL (ref 0.61–1.24)
GFR, Estimated: >60 mL/min (ref >60)
Glucose, Bld: 138 mg/dL — ABNORMAL HIGH (ref 70–99)
Potassium: 3.6 mmol/L (ref 3.5–5.1)
Sodium: 131 mmol/L — ABNORMAL LOW (ref 135–145)

## 2020-12-07 LAB — LACTIC ACID, PLASMA: Lactic Acid, Venous: 1.3 mmol/L (ref 0.5–1.9)

## 2020-12-07 IMAGING — CR DG HIP (WITH OR WITHOUT PELVIS) 2-3V*R*
3 series · 3 of 3 positions shown · non-contrast
Comparison: Intraoperative radiographs [DATE]

CLINICAL DATA: Status post total right hip arthroplasty [DATE],
pain

EXAM:
DG HIP (WITH OR WITHOUT PELVIS) 2-3V RIGHT

[pelvis ap]
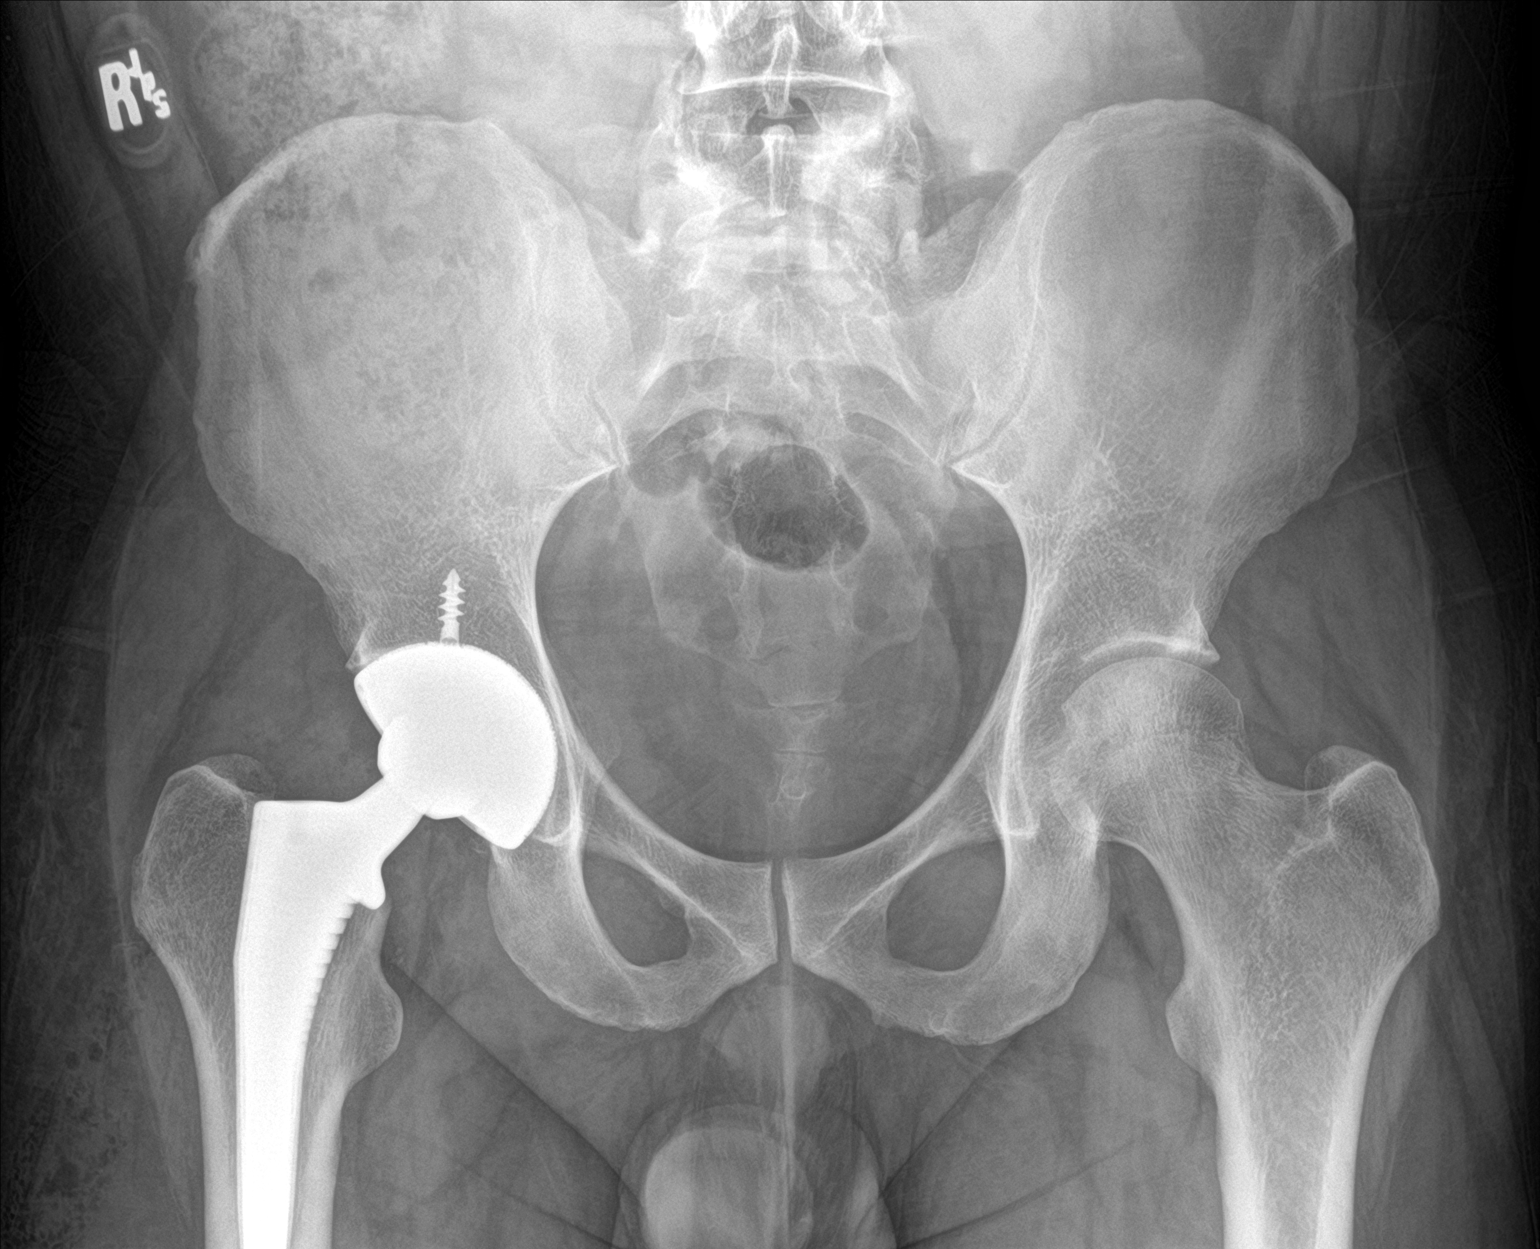

[hip ap]
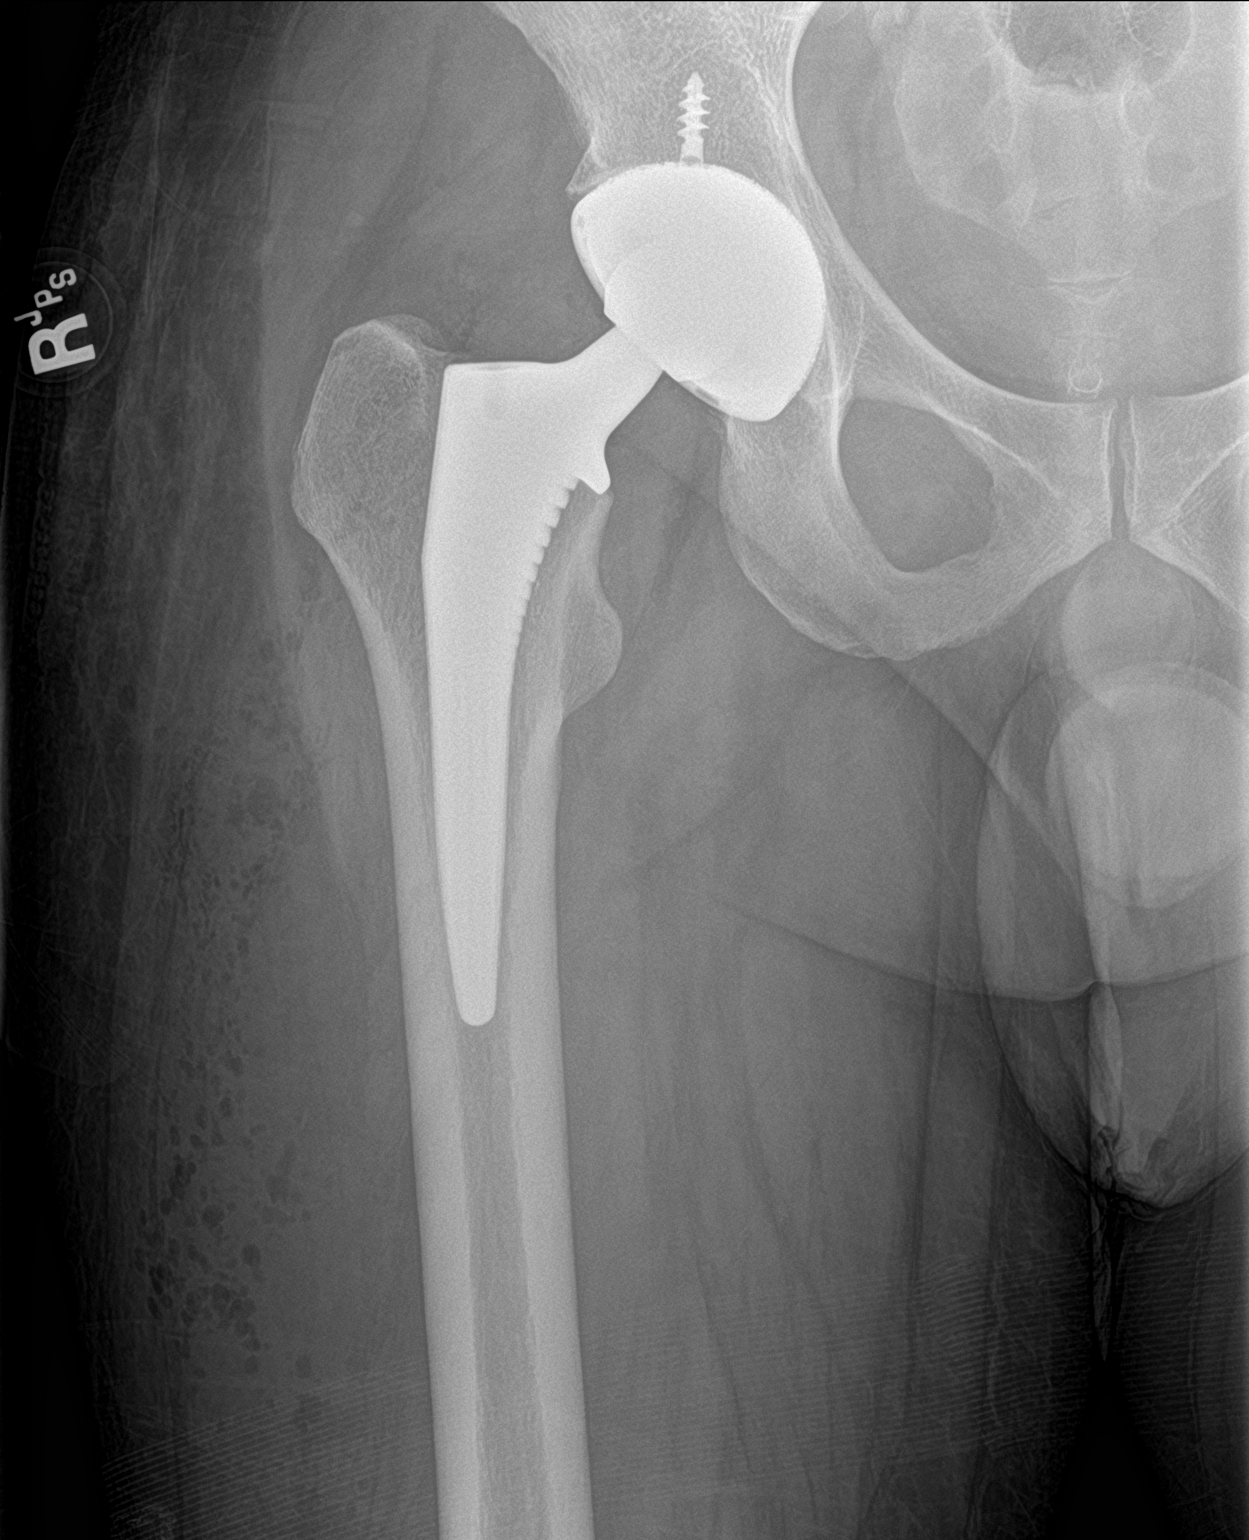

[hip x-table]
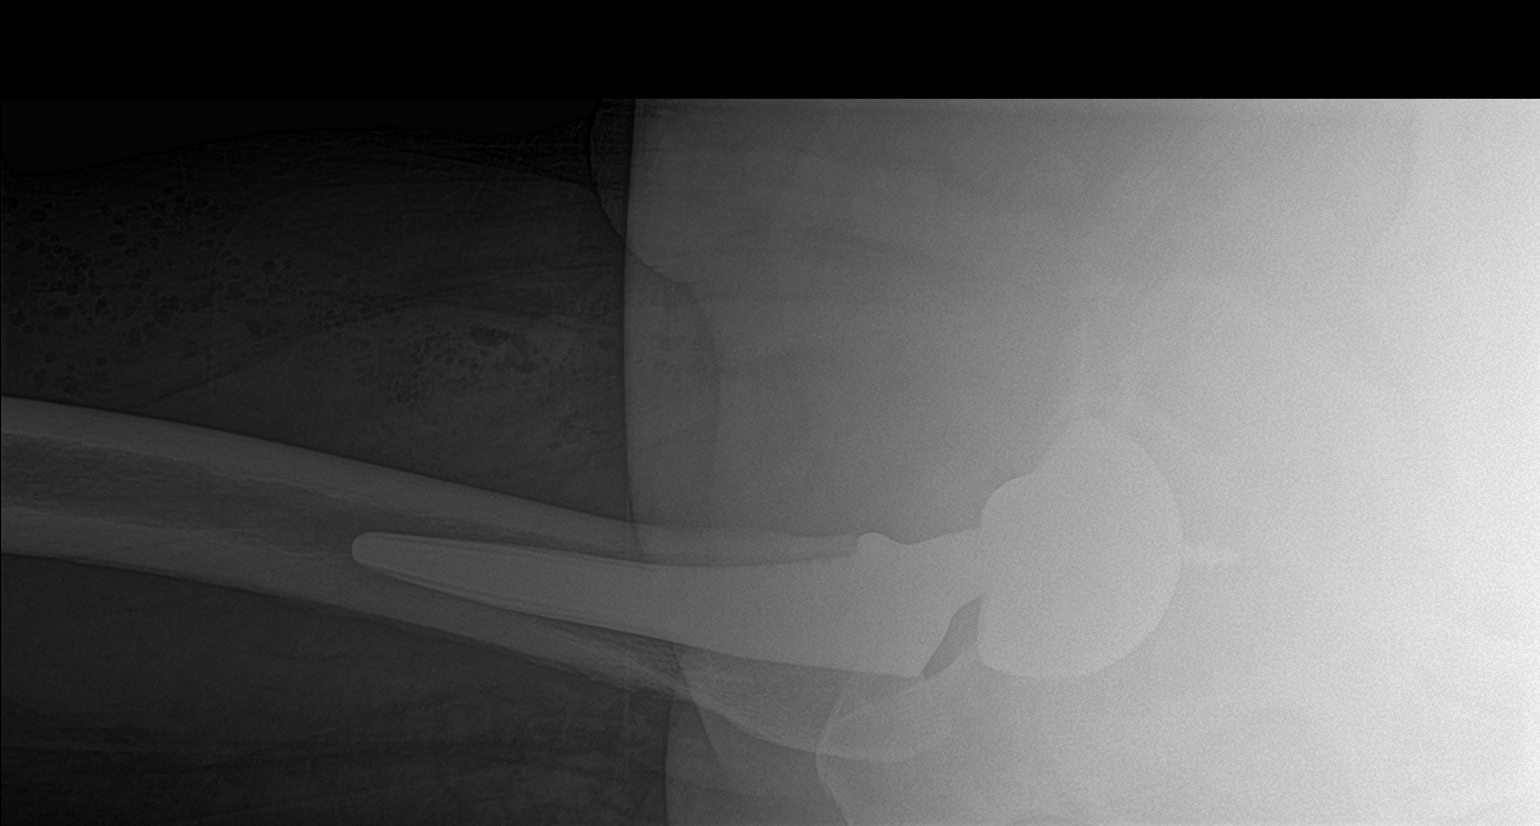

[3 of 3 positions shown; findings below may reference images not displayed]

FINDINGS: Postsurgical changes reflecting right hip arthroplasty are again
seen. Hardware alignment is within expected limits. There is no
evidence of hardware related complication. Left hip alignment is
stable. The SI joints and symphysis pubis are intact.

There are scattered locules of subcutaneous gas likely related to
recent operation.
IMPRESSION: Postsurgical changes reflecting right hip arthroplasty without
evidence of complication.

## 2020-12-07 NOTE — Progress Notes (Signed)
Lower extremity venous has been completed.   Preliminary results in CV Proc.   [name] Rony Ratz 12/07/2020 10:07 AM

## 2020-12-07 NOTE — ED Triage Notes (Signed)
Pt reports right hip/leg pain with fever, recent hip replacement.

## 2020-12-07 NOTE — ED Provider Notes (Signed)
Emergency Medicine Provider Triage Evaluation Note  Shane Cole. , a 40 y.o. male  was evaluated in triage.  Pt complains of RLE extremity edema and pain x2 days. Patient had a total right hip arthroplasty on 9/12 by Dr. Roda Shutters. He started PT 2 days ago and began having significant pain and edema since. He developed a fever yesterday. Tmax 102F. No chest pain. Admits to chronic shortness of breath due to tobacco use. He was instructed by orthopedic surgeon to report to the ED for further evaluation.   Review of Systems  Positive: Lower extremity edema, fever Negative: CP  Physical Exam  BP (!) 145/91 (BP Location: Right Arm)   Pulse (!) 114   Resp 18   SpO2 95%  Gen:   Awake, no distress   Resp:  Normal effort  MSK:   Moves extremities without difficulty  Other:  Edematous RLE. TTP throughout right hip with warmth  Medical Decision Making  Medically screening exam initiated at 9:08 AM.  Appropriate orders placed.  [name] was informed that the remainder of the evaluation will be completed by another provider, this initial triage assessment does not replace that evaluation, and the importance of remaining in the ED until their evaluation is complete.  Infectious labs ordered US to rule out DVT X-ray   Jesusita Oka 12/07/20 0911    Wynetta Fines, MD 12/07/20 [phone]

## 2020-12-07 NOTE — ED Notes (Signed)
Called pt 3x no answer moving off the floor 

## 2020-12-10 ENCOUNTER — Telehealth: Payer: Self-pay

## 2020-12-10 NOTE — Telephone Encounter (Signed)
I'm sure this is due to inflammation and pain.  How's PT going?

## 2020-12-10 NOTE — Telephone Encounter (Signed)
Patient called stating that he can't move his right hip.  Stated that he was doing okay the day of surgery and that Tuesday, 12/04/2020, but then things went down hill after that.  Patient had right hip surgery on 12/03/2020.  CB# [phone].  Please advise.  Thank you.

## 2020-12-11 NOTE — Telephone Encounter (Signed)
Sent mychart msg.

## 2020-12-12 ENCOUNTER — Other Ambulatory Visit (HOSPITAL_COMMUNITY): Payer: Self-pay

## 2020-12-12 LAB — CULTURE, BLOOD (ROUTINE X 2)
Culture: NO GROWTH
Special Requests: ADEQUATE

## 2020-12-18 ENCOUNTER — Other Ambulatory Visit: Payer: Self-pay

## 2020-12-18 ENCOUNTER — Encounter: Payer: Self-pay | Admitting: Orthopaedic Surgery

## 2020-12-18 ENCOUNTER — Ambulatory Visit (INDEPENDENT_AMBULATORY_CARE_PROVIDER_SITE_OTHER): Payer: No Typology Code available for payment source | Admitting: Physician Assistant

## 2020-12-18 DIAGNOSIS — Z96641 Presence of right artificial hip joint: Secondary | ICD-10-CM

## 2020-12-18 NOTE — Progress Notes (Signed)
Post-Op Visit Note   Patient: Shane Cole.           Date of Birth: [birth year]           MRN: [id number] Visit Date: 12/18/2020 PCP: Shirline Frees, NP   Assessment & Plan:  Chief Complaint:  Chief Complaint  Patient presents with   Right Hip - Routine Post Op, Pain   Visit Diagnoses:  1. History of total hip replacement, right     Plan: Patient is a pleasant 40 year old gentleman who comes in today 2 weeks status post right total hip replacement 12/03/2020.  He has been doing well.  He initially had significant pain and swelling to the entire right lower extremity and was seen in the ED where Doppler ultrasound was obtained.  This was negative for DVT.  He has been doing much better.  He is still doing home health physical therapy and is ambulating with a walker.  He is taking Tylenol for pain.  He is taking his Eliquis for DVT prophylaxis.  Examination of the right hip reveals a well-healing surgical incision with nylon sutures in place.  No evidence of infection or cellulitis.  Calves are soft nontender.  He is neurovascular intact distally.  Today, sutures were removed and Steri-Strips applied.  He will continue to advance with activity as tolerated.  Continue with home health physical therapy.  Follow-up with [place] in 4 weeks time for AP pelvis x-rays.  Dental prophylaxis reinforced.  Follow-Up Instructions: Return in about 4 weeks (around 01/15/2021).   Orders:  No orders of the defined types were placed in this encounter.  No orders of the defined types were placed in this encounter.   Imaging: No new imaging  PMFS History: Patient Active Problem List   Diagnosis Date Noted   Status post total replacement of right hip 12/03/2020   Pharmacologic therapy 07/30/2020   Disorder of skeletal system 07/30/2020   Problems influencing health status 07/30/2020   Reflex sympathetic dystrophy of lower extremity (Left) 07/30/2020   Complex regional pain syndrome I of lower  limb (Left) 07/30/2020   Avascular necrosis of hip (Right) (HCC) 07/30/2020   Chronic hip pain (Right) 07/30/2020   Chronic low back pain (2ry area of Pain) (Bilateral) w/o sciatica 07/30/2020   Chronic shoulder pain (3ry area of Pain) (Right) 07/30/2020   Auricular cyst 01/21/2018   Laryngopharyngeal reflux (LPR) 01/21/2018   GERD (gastroesophageal reflux disease) 07/16/2016   Internal hemorrhoid 07/16/2016   Abdominal pain, chronic, epigastric 07/16/2016   Rectal bleeding 07/16/2016   Essential hypertension 02/26/2016   Adhesive capsulitis of right shoulder 07/16/2015   Thoracic outlet syndrome 02/12/2015   Chronic narcotic use 10/26/2014   Opioid contract exists 10/26/2014   Smoker 05/11/2014   Coccygeal pain, acute 05/18/2012   Chronic pain syndrome 05/18/2012   Chronic diarrhea 03/29/2012   Bipolar disorder (HCC) 03/29/2012   Chronic foot pain (1ry area of Pain) (Left) 06/11/2011   Past Medical History:  Diagnosis Date   Allergy    Anxiety    Bipolar disorder (HCC)    Bronchitis    Bronchitis    Crush injury lower leg    Left lower leg   Depression    Gastric ulcer    GERD (gastroesophageal reflux disease)    will awaken him in night   History of hiatal hernia    Hypertension    Migraine    Nerve pain    RSD (reflex sympathetic dystrophy)  Family History  Problem Relation Age of Onset   Hyperlipidemia Father    Hypertension Father    Anxiety disorder Father    Drug abuse Father    Cancer Paternal Grandfather        lung, colon   Colon cancer Paternal Grandfather    Lung cancer Paternal Grandfather    Diabetes Paternal Grandmother    Cancer Paternal Grandmother    Cancer Maternal Grandmother    Cancer Maternal Grandfather    Lung cancer Maternal Grandfather    Stroke Other    Heart disease Other    Depression Paternal Aunt    Anxiety disorder Paternal Aunt    Drug abuse Paternal Uncle    Colon cancer Paternal Uncle    Esophageal cancer Neg Hx     Stomach cancer Neg Hx    Rectal cancer Neg Hx     Past Surgical History:  Procedure Laterality Date   CHOLECYSTECTOMY N/A 01/24/2014   Procedure: LAPAROSCOPIC CHOLECYSTECTOMY;  Surgeon: Axel Filler, MD;  Location: [facility] OR;  Service: General;  Laterality: N/A;   COLONOSCOPY     HAND SURGERY     MASS EXCISION Left 01/25/2013   Procedure: EXCISION MASS DORSAL ASPECT LEFT LONG FINGER DISTAL INTERPHALANGEAL JOINT;  Surgeon: Wyn Forster., MD;  Location: [facility];  Service: Orthopedics;  Laterality: Left;  Left long    MOUTH SURGERY     TOTAL HIP ARTHROPLASTY Right 12/03/2020   Procedure: TOTAL HIP ARTHROPLASTY ANTERIOR APPROACH;  Surgeon: Tarry Kos, MD;  Location: [facility] OR;  Service: Orthopedics;  Laterality: Right;  3-C   Social History   Occupational History   Occupation: Disabled    Comment: Previously worked Web designer  Tobacco Use   Smoking status: Every Day    Packs/day: 0.25    Years: 23.00    Pack years: 5.75    Types: Cigarettes   Smokeless tobacco: Never   Tobacco comments:    Using nicotine patch  Vaping Use   Vaping Use: Never used  Substance and Sexual Activity   Alcohol use: Yes    Alcohol/week: 6.0 standard drinks    Types: 6 Cans of beer per week    Comment: occasional   Drug use: No   Sexual activity: Yes    Partners: Female

## 2020-12-25 ENCOUNTER — Other Ambulatory Visit (HOSPITAL_COMMUNITY): Payer: Self-pay

## 2020-12-27 ENCOUNTER — Other Ambulatory Visit (HOSPITAL_COMMUNITY): Payer: Self-pay

## 2020-12-27 MED ORDER — NICOTINE 21 MG/24HR TD PT24
MEDICATED_PATCH | TRANSDERMAL | 0 refills | Status: DC
  Filled 2020-12-27: qty 28, 28d supply, fill #0

## 2021-01-01 ENCOUNTER — Other Ambulatory Visit (HOSPITAL_COMMUNITY): Payer: Self-pay

## 2021-01-02 ENCOUNTER — Other Ambulatory Visit (HOSPITAL_COMMUNITY): Payer: Self-pay

## 2021-01-02 ENCOUNTER — Ambulatory Visit: Payer: No Typology Code available for payment source | Admitting: Pain Medicine

## 2021-01-11 ENCOUNTER — Ambulatory Visit: Payer: No Typology Code available for payment source | Admitting: Adult Health

## 2021-01-14 ENCOUNTER — Other Ambulatory Visit: Payer: Self-pay

## 2021-01-15 ENCOUNTER — Encounter: Payer: Self-pay | Admitting: Orthopaedic Surgery

## 2021-01-15 ENCOUNTER — Ambulatory Visit (INDEPENDENT_AMBULATORY_CARE_PROVIDER_SITE_OTHER): Payer: No Typology Code available for payment source | Admitting: Adult Health

## 2021-01-15 ENCOUNTER — Encounter: Payer: Self-pay | Admitting: Adult Health

## 2021-01-15 ENCOUNTER — Ambulatory Visit (INDEPENDENT_AMBULATORY_CARE_PROVIDER_SITE_OTHER): Payer: No Typology Code available for payment source | Admitting: Physician Assistant

## 2021-01-15 ENCOUNTER — Other Ambulatory Visit (HOSPITAL_COMMUNITY): Payer: Self-pay

## 2021-01-15 ENCOUNTER — Ambulatory Visit: Payer: Self-pay

## 2021-01-15 VITALS — BP 120/80 | HR 108 | Temp 98.7°F | Ht 71.0 in | Wt 218.0 lb

## 2021-01-15 DIAGNOSIS — G894 Chronic pain syndrome: Secondary | ICD-10-CM | POA: Diagnosis not present

## 2021-01-15 DIAGNOSIS — Z96641 Presence of right artificial hip joint: Secondary | ICD-10-CM

## 2021-01-15 DIAGNOSIS — Z23 Encounter for immunization: Secondary | ICD-10-CM

## 2021-01-15 MED ORDER — OXYCODONE HCL 5 MG PO TABS
5.0000 mg | ORAL_TABLET | Freq: Four times a day (QID) | ORAL | 0 refills | Status: AC
Start: 2021-01-15 — End: 2021-02-14
  Filled 2021-01-15: qty 120, 30d supply, fill #0

## 2021-01-15 MED ORDER — TIZANIDINE HCL 4 MG PO TABS
4.0000 mg | ORAL_TABLET | Freq: Two times a day (BID) | ORAL | 0 refills | Status: DC
Start: 2021-01-15 — End: 2021-04-16
  Filled 2021-01-15: qty 180, 90d supply, fill #0

## 2021-01-15 NOTE — Progress Notes (Signed)
Subjective:    Patient ID: Shane Landry., male    DOB: [birth year], 40 y.o.   MRN: [id number]  HPI  NCCSRS reviewed in EPIC   Indication for chronic opioid: Complex regional pain syndrome of lower extremity avascular necrosis of right hip Medication and dose: Oxycodone 10 mg 3 times daily, gabapentin 600 mg 3 times daily, nortriptyline 75 mg nightly, Motrin 800 mg 3 times daily, Tylenol 1000 mg every 6 hours as needed, and tizanidine 4 mg twice daily as needed # pills per month: 90 tabs of Oxycodone 10 mg  Last UDS date: 11/27/2020 Opioid Treatment Agreement signed: 10/31/2020 Opioid Treatment Agreement last reviewed with patient: 01/16/2021 NCCSRS reviewed this encounter (include red flags):  Reviewed this encounter. No red flags noted.   Since we had last seen each other he is had a right total hip replacement due to avascular necrosis.  He continues to have some pain but has healed well.  He is going to be returning back to light duty at work starting next week.  He has not had any narcotic pain medication for roughly 5 days as he ran out.  Since the pain in his right hip has improved he would like to start cutting back on his oxycodone.  He does have an appointment with pain management in early December.   Review of Systems See HPI   Past Medical History:  Diagnosis Date   Allergy    Anxiety    Bipolar disorder (HCC)    Bronchitis    Bronchitis    Crush injury lower leg    Left lower leg   Depression    Gastric ulcer    GERD (gastroesophageal reflux disease)    will awaken him in night   History of hiatal hernia    Hypertension    Migraine    Nerve pain    RSD (reflex sympathetic dystrophy)     Social History   Socioeconomic History   Marital status: Married    Spouse name: Not on file   Number of children: 1   Years of education: 10th grade   Highest education level: Not on file  Occupational History   Occupation: Disabled    Comment: Previously  worked Web designer  Tobacco Use   Smoking status: Every Day    Packs/day: 0.25    Years: 23.00    Pack years: 5.75    Types: Cigarettes   Smokeless tobacco: Never   Tobacco comments:    Using nicotine patch  Vaping Use   Vaping Use: Never used  Substance and Sexual Activity   Alcohol use: Yes    Alcohol/week: 6.0 standard drinks    Types: 6 Cans of beer per week    Comment: occasional   Drug use: No   Sexual activity: Yes    Partners: Female  Other Topics Concern   Not on file  Social History Narrative   05/24/12  Shane Cole was born and grew up in [place], [place].    He has 2 sisters.    He reports that his childhood was "lousy."    He completed the 10th grade.    He has been married for 5 years, and is currently separated for 3 weeks.    He has a daughter who is 13-1/2 years old.    He has been unemployed for 2 years, and is disabled due to an on-the-job work accident.    He is currently living with his  aunt and cousin.    He reports that his hobbies are sports and dogs.    He reports that he is spiritual, but not religious.    He states that his aunt and his father are his social support system.    He denies any current legal problems, but got a DUI in 2007. 05/24/12 AHW      Social Determinants of Health   Financial Resource Strain: Not on file  Food Insecurity: Not on file  Transportation Needs: Not on file  Physical Activity: Not on file  Stress: Not on file  Social Connections: Not on file  Intimate Partner Violence: Not on file    Past Surgical History:  Procedure Laterality Date   CHOLECYSTECTOMY N/A 01/24/2014   Procedure: LAPAROSCOPIC CHOLECYSTECTOMY;  Surgeon: Axel Filler, MD;  Location[facility];  Service: General;  Laterality: N/A;   COLONOSCOPY     HAND SURGERY     MASS EXCISION Left 01/25/2013   Procedure: EXCISION MASS DORSAL ASPECT LEFT LONG FINGER DISTAL INTERPHALANGEAL JOINT;  Surgeon: Wyn Forster., MD;  Location: [facility];  Service: Orthopedics;  Laterality: Left;  Left long    MOUTH SURGERY     TOTAL HIP ARTHROPLASTY Right 12/03/2020   Procedure: TOTAL HIP ARTHROPLASTY ANTERIOR APPROACH;  Surgeon: Tarry Kos, MD;  Location: [facility];  Service: Orthopedics;  Laterality: Right;  3-C    Family History  Problem Relation Age of Onset   Hyperlipidemia Father    Hypertension Father    Anxiety disorder Father    Drug abuse Father    Cancer Paternal Grandfather        lung, colon   Colon cancer Paternal Grandfather    Lung cancer Paternal Grandfather    Diabetes Paternal Grandmother    Cancer Paternal Grandmother    Cancer Maternal Grandmother    Cancer Maternal Grandfather    Lung cancer Maternal Grandfather    Stroke Other    Heart disease Other    Depression Paternal Aunt    Anxiety disorder Paternal Aunt    Drug abuse Paternal Uncle    Colon cancer Paternal Uncle    Esophageal cancer Neg Hx    Stomach cancer Neg Hx    Rectal cancer Neg Hx     Allergies  Allergen Reactions   Aspirin Shortness Of Breath and Tinitus    Tinnitus, dizzy, short of breath   Codeine Itching    Reaction to tylenol #3   Penicillins Anaphylaxis and Other (See Comments)    Was told never to take medication.  Has patient had a PCN reaction causing immediate rash, facial/tongue/throat swelling, SOB or lightheadedness with hypotension: no Has patient had a PCN reaction causing severe rash involving mucus membranes or skin necrosis: no Has patient had a PCN reaction that required hospitalization: no Has patient had a PCN reaction occurring within the last 10 years: no If all of the above answers are "NO", then may proceed with Cephalosporin use.    Tramadol     seizure    Amitriptyline Rash    Current Outpatient Medications on File Prior to Visit  Medication Sig Dispense Refill   diphenhydrAMINE (BENADRYL) 25 MG tablet Take 25 mg by mouth every 6 (six) hours as needed for allergies.     docusate  sodium (COLACE) 100 MG capsule Take 1 capsule (100 mg total) by mouth daily as needed. To be taken after surgery 30 capsule 2   famotidine (PEPCID) 20 MG  tablet Take 20 mg by mouth daily as needed for heartburn.     gabapentin (NEURONTIN) 300 MG capsule Take 1 capsule (300 mg total) by mouth 3 (three) times daily. 270 capsule 0   lidocaine (LIDODERM) 5 % Place 1 patch onto the skin daily. Remove & Discard patch within 12 hours or as directed by MD     lidocaine (XYLOCAINE) 5 % ointment Apply 1 application topically as needed for moderate pain. 50 g 2   Lidocaine 3 % CREA Apply 1 application topically daily as needed (pain).     Multiple Vitamin (MULTIVITAMIN WITH MINERALS) TABS tablet Take 1 tablet by mouth daily.     Nerve Stimulator (TENS THERAPY PAIN RELIEF) DEVI 1 application by Does not apply route daily as needed (chronic pain).     nicotine (NICODERM CQ - DOSED IN MG/24 HOURS) 21 mg/24hr patch Place onto the skin as directed 28 patch 0   nicotine polacrilex (NICORETTE) 4 MG gum USE AS DIRECTED 110 each 0   nortriptyline (PAMELOR) 75 MG capsule Take 1 capsule (75 mg total) by mouth at bedtime. 90 capsule 0   ondansetron (ZOFRAN ODT) 4 MG disintegrating tablet Dissolve 1 tablet (4 mg total) by mouth every 8 (eight) hours as needed for nausea or vomiting. 20 tablet 2   ondansetron (ZOFRAN) 4 MG tablet Take 1 tablet (4 mg total) by mouth every 8 (eight) hours as needed for nausea or vomiting. To be taken after surgery 40 tablet 0   pantoprazole (PROTONIX) 40 MG tablet Take 1 tablet twice daily 30 to 60 minutes before breakfast and dinner. (Patient taking differently: Take 40 mg by mouth in the morning.) 60 tablet 5   sildenafil (REVATIO) 20 MG tablet TAKE 1 TABLET BY MOUTH 2 TIMES DAILY 180 tablet 1   No current facility-administered medications on file prior to visit.    BP 120/80   Pulse (!) 108   Temp 98.7 F (37.1 C) (Oral)   Ht 5\' 11"  (1.803 m)   Wt 218 lb (98.9 kg)   SpO2 95%   BMI  30.40 kg/m       Objective:   Physical Exam Vitals and nursing note reviewed.  Constitutional:      Appearance: Normal appearance.  Cardiovascular:     Rate and Rhythm: Normal rate and regular rhythm.     Pulses: Normal pulses.     Heart sounds: Normal heart sounds.  Pulmonary:     Effort: Pulmonary effort is normal.     Breath sounds: Normal breath sounds.  Neurological:     General: No focal deficit present.     Mental Status: He is alert. Mental status is at baseline. He is disoriented.     Gait: Gait abnormal (steady gait. walking with cane).  Psychiatric:        Mood and Affect: Mood normal.        Behavior: Behavior normal.        Thought Content: Thought content normal.        Judgment: Judgment normal.      Assessment & Plan:  1. Chronic pain syndrome -We will decrease oxycodone from 10 mg 3 times daily to 5 mg 4 times daily, he was advised that he can decrease further if able.  Has not had any narcotic pain medication for roughly 5 days, so has urine drug screen would be negative.  We will hold off on urine drug screen for 1 month.  He still needs to keep  his appointment with pain management - oxyCODONE (ROXICODONE) 5 MG immediate release tablet; Take 1 tablet (5 mg total) by mouth every 6 (six) hours.  Dispense: 120 tablet; Refill: 0 - tiZANidine (ZANAFLEX) 4 MG tablet; Take 1 tablet (4 mg total) by mouth 2 (two) times daily.  Dispense: 180 tablet; Refill: 0  2. Need for immunization against influenza  - Flu Vaccine QUAD 19mo+IM (Fluarix, Fluzone & Alfiuria Quad PF)  Shirline Frees, NP

## 2021-01-15 NOTE — Patient Instructions (Signed)
It was great seeing you today and I am glad you are doing so well.   I have sent in your muscle relaxer and Oxycodone. We are going to taper you down to 5 mg four times a day   Follow up in one month or sooner if needed

## 2021-01-15 NOTE — Progress Notes (Signed)
Post-Op Visit Note   Patient: Shane Cole.           Date of Birth: 03-18-1981           MRN: [id number] Visit Date: 01/15/2021 PCP: Shirline Frees, NP   Assessment & Plan:  Chief Complaint:  Chief Complaint  Patient presents with   Right Hip - Pain   Visit Diagnoses:  1. History of total hip replacement, right   2. Status post total replacement of right hip     Plan: Patient is a pleasant 40 year old gentleman who comes in today 6 weeks status post right total hip replacement 12/03/2020.  He is doing excellent.  He is ambulating occasionally with a cane.  He notes an occasional groin pain with twisting but nothing more.  No calf pain, chest pain or shortness of breath.  He is no longer requiring narcotic pain medication.  Examination of his right hip reveals a painless logroll.  Full range of motion.  Calves are soft and nontender.  He is neurovascular intact distally.  At this point, he will continue to advance with activity as tolerated.  Dental prophylaxis reinforced.  Follow-up with [place] in 6 weeks time for repeat evaluation.  Call with concerns or questions.  Follow-Up Instructions: Return in about 6 weeks (around 02/26/2021).   Orders:  Orders Placed This Encounter  Procedures   XR Pelvis 1-2 Views   No orders of the defined types were placed in this encounter.   Imaging: XR Pelvis 1-2 Views  Result Date: 01/15/2021 X-rays reveal a well-seated prosthesis without complication   PMFS History: Patient Active Problem List   Diagnosis Date Noted   Status post total replacement of right hip 12/03/2020   Pharmacologic therapy 07/30/2020   Disorder of skeletal system 07/30/2020   Problems influencing health status 07/30/2020   Reflex sympathetic dystrophy of lower extremity (Left) 07/30/2020   Complex regional pain syndrome I of lower limb (Left) 07/30/2020   Avascular necrosis of hip (Right) (HCC) 07/30/2020   Chronic hip pain (Right) 07/30/2020   Chronic low  back pain (2ry area of Pain) (Bilateral) w/o sciatica 07/30/2020   Chronic shoulder pain (3ry area of Pain) (Right) 07/30/2020   Auricular cyst 01/21/2018   Laryngopharyngeal reflux (LPR) 01/21/2018   GERD (gastroesophageal reflux disease) 07/16/2016   Internal hemorrhoid 07/16/2016   Abdominal pain, chronic, epigastric 07/16/2016   Rectal bleeding 07/16/2016   Essential hypertension 02/26/2016   Adhesive capsulitis of right shoulder 07/16/2015   Thoracic outlet syndrome 02/12/2015   Chronic narcotic use 10/26/2014   Opioid contract exists 10/26/2014   Smoker 05/11/2014   Coccygeal pain, acute 05/18/2012   Chronic pain syndrome 05/18/2012   Chronic diarrhea 03/29/2012   Bipolar disorder (HCC) 03/29/2012   Chronic foot pain (1ry area of Pain) (Left) 06/11/2011   Past Medical History:  Diagnosis Date   Allergy    Anxiety    Bipolar disorder (HCC)    Bronchitis    Bronchitis    Crush injury lower leg    Left lower leg   Depression    Gastric ulcer    GERD (gastroesophageal reflux disease)    will awaken him in night   History of hiatal hernia    Hypertension    Migraine    Nerve pain    RSD (reflex sympathetic dystrophy)     Family History  Problem Relation Age of Onset   Hyperlipidemia Father    Hypertension Father    Anxiety disorder  Father    Drug abuse Father    Cancer Paternal Grandfather        lung, colon   Colon cancer Paternal Grandfather    Lung cancer Paternal Grandfather    Diabetes Paternal Grandmother    Cancer Paternal Grandmother    Cancer Maternal Grandmother    Cancer Maternal Grandfather    Lung cancer Maternal Grandfather    Stroke Other    Heart disease Other    Depression Paternal Aunt    Anxiety disorder Paternal Aunt    Drug abuse Paternal Uncle    Colon cancer Paternal Uncle    Esophageal cancer Neg Hx    Stomach cancer Neg Hx    Rectal cancer Neg Hx     Past Surgical History:  Procedure Laterality Date   CHOLECYSTECTOMY N/A  01/24/2014   Procedure: LAPAROSCOPIC CHOLECYSTECTOMY;  Surgeon: Axel Filler, MD;  Location: [facility];  Service: General;  Laterality: N/A;   COLONOSCOPY     HAND SURGERY     MASS EXCISION Left 01/25/2013   Procedure: EXCISION MASS DORSAL ASPECT LEFT LONG FINGER DISTAL INTERPHALANGEAL JOINT;  Surgeon: Wyn Forster., MD;  Location: [facility];  Service: Orthopedics;  Laterality: Left;  Left long    MOUTH SURGERY     TOTAL HIP ARTHROPLASTY Right 12/03/2020   Procedure: TOTAL HIP ARTHROPLASTY ANTERIOR APPROACH;  Surgeon: Tarry Kos, MD;  Location: [facility];  Service: Orthopedics;  Laterality: Right;  3-C   Social History   Occupational History   Occupation: Disabled    Comment: Previously worked Web designer  Tobacco Use   Smoking status: Every Day    Packs/day: 0.25    Years: 23.00    Pack years: 5.75    Types: Cigarettes   Smokeless tobacco: Never   Tobacco comments:    Using nicotine patch  Vaping Use   Vaping Use: Never used  Substance and Sexual Activity   Alcohol use: Yes    Alcohol/week: 6.0 standard drinks    Types: 6 Cans of beer per week    Comment: occasional   Drug use: No   Sexual activity: Yes    Partners: Female

## 2021-01-25 ENCOUNTER — Other Ambulatory Visit (HOSPITAL_COMMUNITY): Payer: Self-pay

## 2021-01-25 MED ORDER — NICOTINE 21 MG/24HR TD PT24
MEDICATED_PATCH | TRANSDERMAL | 0 refills | Status: DC
  Filled 2021-01-25: qty 28, 28d supply, fill #0

## 2021-01-29 ENCOUNTER — Ambulatory Visit: Payer: Self-pay | Admitting: Psychology

## 2021-02-13 ENCOUNTER — Telehealth: Payer: Self-pay

## 2021-02-13 ENCOUNTER — Ambulatory Visit: Payer: No Typology Code available for payment source | Admitting: Adult Health

## 2021-02-13 NOTE — Telephone Encounter (Signed)
Patient called requesting a weeks worth of his Rx just enough until his next appt oxyCODONE (ROXICODONE) 5 MG immediate release tablet

## 2021-02-13 NOTE — Telephone Encounter (Signed)
Pt verbalized understanding and stated he will wait until next week.

## 2021-02-13 NOTE — Telephone Encounter (Signed)
Pt advised that Shane Cole was not available to refill this Rx. Pt advised to f/u Tuesday. Pt stated he had to cancel his appt. Today due to Flu sx.

## 2021-02-13 NOTE — Telephone Encounter (Signed)
--  Caller has a fever of 101.6 at 0700 and took tylenol, runny nose, slight cough, nausea, body aches, sore throat, and congestion.  02/13/2021 9:09:03 AM [facility], RN, Prentiss Bells Complies Caller Understands Yes

## 2021-02-19 ENCOUNTER — Other Ambulatory Visit: Payer: Self-pay | Admitting: Adult Health

## 2021-02-19 ENCOUNTER — Telehealth: Payer: Self-pay

## 2021-02-19 ENCOUNTER — Other Ambulatory Visit (HOSPITAL_COMMUNITY): Payer: Self-pay

## 2021-02-19 MED ORDER — OXYCODONE HCL 5 MG PO TABS
5.0000 mg | ORAL_TABLET | Freq: Four times a day (QID) | ORAL | 0 refills | Status: DC
  Filled 2021-02-19: qty 32, 8d supply, fill #0

## 2021-02-19 NOTE — Telephone Encounter (Signed)
Okay for refill?  

## 2021-02-19 NOTE — Telephone Encounter (Signed)
Patient called back asking for a Rx refill  oxyCODONE (ROXICODONE) 5 MG

## 2021-02-20 ENCOUNTER — Other Ambulatory Visit: Payer: Self-pay | Admitting: Adult Health

## 2021-02-20 ENCOUNTER — Encounter: Payer: Self-pay | Admitting: Adult Health

## 2021-02-20 ENCOUNTER — Ambulatory Visit (INDEPENDENT_AMBULATORY_CARE_PROVIDER_SITE_OTHER): Payer: No Typology Code available for payment source | Admitting: Adult Health

## 2021-02-20 ENCOUNTER — Other Ambulatory Visit (HOSPITAL_COMMUNITY): Payer: Self-pay

## 2021-02-20 VITALS — BP 110/70 | HR 95 | Temp 98.6°F | Ht 71.0 in | Wt 226.0 lb

## 2021-02-20 DIAGNOSIS — G90529 Complex regional pain syndrome I of unspecified lower limb: Secondary | ICD-10-CM | POA: Diagnosis not present

## 2021-02-20 DIAGNOSIS — G894 Chronic pain syndrome: Secondary | ICD-10-CM | POA: Diagnosis not present

## 2021-02-20 MED ORDER — GABAPENTIN 300 MG PO CAPS
300.0000 mg | ORAL_CAPSULE | Freq: Three times a day (TID) | ORAL | 0 refills | Status: DC
Start: 2021-02-20 — End: 2021-05-13
  Filled 2021-02-20: qty 270, 90d supply, fill #0

## 2021-02-20 MED ORDER — NORTRIPTYLINE HCL 75 MG PO CAPS
75.0000 mg | ORAL_CAPSULE | Freq: Every day | ORAL | 0 refills | Status: DC
Start: 2021-02-20 — End: 2021-05-13
  Filled 2021-02-20: qty 90, 90d supply, fill #0

## 2021-02-20 NOTE — Telephone Encounter (Signed)
Patient notified of update  and verbalized understanding. 

## 2021-02-20 NOTE — Progress Notes (Deleted)
Subjective:    Patient ID: Shane Cole., male    DOB: Jun 13, 1980, 40 y.o.   MRN: [id number]  HPI 40 year old male who  has a past medical history of Allergy, Anxiety, Bipolar disorder ([place]), Bronchitis, Bronchitis, Crush injury lower leg, Depression, Gastric ulcer, GERD (gastroesophageal reflux disease), History of hiatal hernia, Hypertension, Migraine, Nerve pain, and RSD (reflex sympathetic dystrophy).     Review of Systems See HPI   Past Medical History:  Diagnosis Date   Allergy    Anxiety    Bipolar disorder ([place])    Bronchitis    Bronchitis    Crush injury lower leg    Left lower leg   Depression    Gastric ulcer    GERD (gastroesophageal reflux disease)    will awaken him in night   History of hiatal hernia    Hypertension    Migraine    Nerve pain    RSD (reflex sympathetic dystrophy)     Social History   Socioeconomic History   Marital status: Married    Spouse name: Not on file   Number of children: 1   Years of education: 10th grade   Highest education level: Not on file  Occupational History   Occupation: Disabled    Comment: Previously worked Medical sales representative  Tobacco Use   Smoking status: Every Day    Packs/day: 0.25    Years: 23.00    Pack years: 5.75    Types: Cigarettes   Smokeless tobacco: Never   Tobacco comments:    Using nicotine patch  Vaping Use   Vaping Use: Never used  Substance and Sexual Activity   Alcohol use: Yes    Alcohol/week: 6.0 standard drinks    Types: 6 Cans of beer per week    Comment: occasional   Drug use: No   Sexual activity: Yes    Partners: Female  Other Topics Concern   Not on file  Social History Narrative   05/24/12  [name] was born and grew up in [place], [place].    He has 2 sisters.    He reports that his childhood was "lousy."    He completed the 10th grade.    He has been married for 5 years, and is currently separated for 3 weeks.    He has a daughter who is 22-1/2  years old.    He has been unemployed for 2 years, and is disabled due to an on-the-job work accident.    He is currently living with his aunt and cousin.    He reports that his hobbies are sports and dogs.    He reports that he is spiritual, but not religious.    He states that his aunt and his father are his social support system.    He denies any current legal problems, but got a DUI in 2007. 05/24/12 AHW      Social Determinants of Health   Financial Resource Strain: Not on file  Food Insecurity: Not on file  Transportation Needs: Not on file  Physical Activity: Not on file  Stress: Stress Concern Present   Feeling of Stress : To some extent  Social Connections: Unknown   Frequency of Communication with Friends and Family: Once a week   Frequency of Social Gatherings with Friends and Family: Once a week   Attends Religious Services: Not on Electrical engineer or Organizations: No   Attends [employer]  or Organization Meetings: Not on file   Marital Status: Married  Intimate Partner Violence: Not on file    Past Surgical History:  Procedure Laterality Date   CHOLECYSTECTOMY N/A 01/24/2014   Procedure: LAPAROSCOPIC CHOLECYSTECTOMY;  Surgeon: Ralene Ok, MD;  Location: [facility];  Service: General;  Laterality: N/A;   COLONOSCOPY     HAND SURGERY     MASS EXCISION Left 01/25/2013   Procedure: EXCISION MASS DORSAL ASPECT LEFT LONG FINGER DISTAL INTERPHALANGEAL JOINT;  Surgeon: Cammie Sickle., MD;  Location: [facility];  Service: Orthopedics;  Laterality: Left;  Left long    MOUTH SURGERY     TOTAL HIP ARTHROPLASTY Right 12/03/2020   Procedure: TOTAL HIP ARTHROPLASTY ANTERIOR APPROACH;  Surgeon: Leandrew Koyanagi, MD;  Location: [facility];  Service: Orthopedics;  Laterality: Right;  3-C    Family History  Problem Relation Age of Onset   Hyperlipidemia Father    Hypertension Father    Anxiety disorder Father    Drug abuse Father    Cancer Paternal Grandfather         lung, colon   Colon cancer Paternal Grandfather    Lung cancer Paternal Grandfather    Diabetes Paternal Grandmother    Cancer Paternal Grandmother    Cancer Maternal Grandmother    Cancer Maternal Grandfather    Lung cancer Maternal Grandfather    Stroke Other    Heart disease Other    Depression Paternal Aunt    Anxiety disorder Paternal Aunt    Drug abuse Paternal Uncle    Colon cancer Paternal Uncle    Esophageal cancer Neg Hx    Stomach cancer Neg Hx    Rectal cancer Neg Hx     Allergies  Allergen Reactions   Aspirin Shortness Of Breath and Tinitus    Tinnitus, dizzy, short of breath   Codeine Itching    Reaction to tylenol #3   Penicillins Anaphylaxis and Other (See Comments)    Was told never to take medication.  Has patient had a PCN reaction causing immediate rash, facial/tongue/throat swelling, SOB or lightheadedness with hypotension: no Has patient had a PCN reaction causing severe rash involving mucus membranes or skin necrosis: no Has patient had a PCN reaction that required hospitalization: no Has patient had a PCN reaction occurring within the last 10 years: no If all of the above answers are "NO", then may proceed with Cephalosporin use.    Tramadol     seizure    Amitriptyline Rash    Current Outpatient Medications on File Prior to Visit  Medication Sig Dispense Refill   diphenhydrAMINE (BENADRYL) 25 MG tablet Take 25 mg by mouth every 6 (six) hours as needed for allergies.     docusate sodium (COLACE) 100 MG capsule Take 1 capsule (100 mg total) by mouth daily as needed. To be taken after surgery 30 capsule 2   famotidine (PEPCID) 20 MG tablet Take 20 mg by mouth daily as needed for heartburn.     gabapentin (NEURONTIN) 300 MG capsule Take 1 capsule (300 mg total) by mouth 3 (three) times daily. 270 capsule 0   lidocaine (LIDODERM) 5 % Place 1 patch onto the skin daily. Remove & Discard patch within 12 hours or as directed by MD     lidocaine  (XYLOCAINE) 5 % ointment Apply 1 application topically as needed for moderate pain. 50 g 2   Lidocaine 3 % CREA Apply 1 application topically daily as needed (pain).  Multiple Vitamin (MULTIVITAMIN WITH MINERALS) TABS tablet Take 1 tablet by mouth daily.     Nerve Stimulator (TENS THERAPY PAIN RELIEF) DEVI 1 application by Does not apply route daily as needed (chronic pain).     nicotine (NICODERM CQ - DOSED IN MG/24 HOURS) 21 mg/24hr patch Place onto the skin once a day as directed. 28 patch 0   nicotine polacrilex (NICORETTE) 4 MG gum USE AS DIRECTED 110 each 0   nortriptyline (PAMELOR) 75 MG capsule Take 1 capsule (75 mg total) by mouth at bedtime. 90 capsule 0   ondansetron (ZOFRAN ODT) 4 MG disintegrating tablet Dissolve 1 tablet (4 mg total) by mouth every 8 (eight) hours as needed for nausea or vomiting. 20 tablet 2   ondansetron (ZOFRAN) 4 MG tablet Take 1 tablet (4 mg total) by mouth every 8 (eight) hours as needed for nausea or vomiting. To be taken after surgery 40 tablet 0   oxyCODONE (ROXICODONE) 5 MG immediate release tablet Take 1 tablet (5 mg total) by mouth 4 (four) times daily for 8 days. 32 tablet 0   pantoprazole (PROTONIX) 40 MG tablet Take 1 tablet twice daily 30 to 60 minutes before breakfast and dinner. (Patient taking differently: Take 40 mg by mouth in the morning.) 60 tablet 5   sildenafil (REVATIO) 20 MG tablet TAKE 1 TABLET BY MOUTH 2 TIMES DAILY 180 tablet 1   tiZANidine (ZANAFLEX) 4 MG tablet Take 1 tablet (4 mg total) by mouth 2 (two) times daily. 180 tablet 0   No current facility-administered medications on file prior to visit.    BP 110/70   Pulse 95   Temp 98.6 F (37 C) (Oral)   Ht 5\' 11"  (1.803 m)   Wt 226 lb (102.5 kg)   SpO2 96%   BMI 31.52 kg/m       Objective:   Physical Exam        Assessment & Plan:

## 2021-02-20 NOTE — Progress Notes (Signed)
Subjective:    Patient ID: Shane Shove., male    DOB: 1980/04/08, 40 y.o.   MRN: [id number]  HPI  NCCSRS reviewed in EPIC   Indication for chronic opioid: Phonic regional pain syndrome of lower extremity Medication and dose: Oxycodone 5 mg 4 times daily, gabapentin 600 mg 3 times daily, nortriptyline 75 mg nightly, Motrin 800 mg 3 times daily, Tylenol 1000 mg every 6 hours as needed, and tizanidine 4 mg twice daily as needed # pills per month: 120 tabs of oxycodone 5 mg Last UDS date: 02/20/2021 Opioid Treatment Agreement signed: 10/31/2020 Opioid Treatment Agreement last reviewed with patient: 02/20/2021 NCCSRS reviewed this encounter (include red flags):  reviewed today with no red flags   He does have a follow-up appointment with pain management in early December but informs this writer that he needs to reschedule this appointment until after the first of the year, likely February, after he receives money on his [employer] benefits card.  He was out of work for some time after total hip replacement and he does not have the funds right now to go to pain management.   Review of Systems See HPI   Past Medical History:  Diagnosis Date   Allergy    Anxiety    Bipolar disorder ([place])    Bronchitis    Bronchitis    Crush injury lower leg    Left lower leg   Depression    Gastric ulcer    GERD (gastroesophageal reflux disease)    will awaken him in night   History of hiatal hernia    Hypertension    Migraine    Nerve pain    RSD (reflex sympathetic dystrophy)     Social History   Socioeconomic History   Marital status: Married    Spouse name: Not on file   Number of children: 1   Years of education: 10th grade   Highest education level: Not on file  Occupational History   Occupation: Disabled    Comment: Previously worked Medical sales representative  Tobacco Use   Smoking status: Every Day    Packs/day: 0.25    Years: 23.00    Pack years: 5.75     Types: Cigarettes   Smokeless tobacco: Never   Tobacco comments:    Using nicotine patch  Vaping Use   Vaping Use: Never used  Substance and Sexual Activity   Alcohol use: Yes    Alcohol/week: 6.0 standard drinks    Types: 6 Cans of beer per week    Comment: occasional   Drug use: No   Sexual activity: Yes    Partners: Female  Other Topics Concern   Not on file  Social History Narrative   05/24/12  Lorry was born and grew up in [place], [place].    He has 2 sisters.    He reports that his childhood was "lousy."    He completed the 10th grade.    He has been married for 5 years, and is currently separated for 3 weeks.    He has a daughter who is 39-1/2 years old.    He has been unemployed for 2 years, and is disabled due to an on-the-job work accident.    He is currently living with his aunt and cousin.    He reports that his hobbies are sports and dogs.    He reports that he is spiritual, but not religious.    He states that  his aunt and his father are his social support system.    He denies any current legal problems, but got a DUI in 2007. 05/24/12 [facility]      Social Determinants of Health   Financial Resource Strain: Not on file  Food Insecurity: Not on file  Transportation Needs: Not on file  Physical Activity: Not on file  Stress: Stress Concern Present   Feeling of Stress : To some extent  Social Connections: Unknown   Frequency of Communication with Friends and Family: Once a week   Frequency of Social Gatherings with Friends and Family: Once a week   Attends Religious Services: Not on Electrical engineer or Organizations: No   Attends Archivist Meetings: Not on file   Marital Status: Married  Human resources officer Violence: Not on file    Past Surgical History:  Procedure Laterality Date   CHOLECYSTECTOMY N/A 01/24/2014   Procedure: LAPAROSCOPIC CHOLECYSTECTOMY;  Surgeon: Ralene Ok, MD;  Location: [facility];  Service: General;  Laterality: N/A;    COLONOSCOPY     HAND SURGERY     MASS EXCISION Left 01/25/2013   Procedure: EXCISION MASS DORSAL ASPECT LEFT LONG FINGER DISTAL INTERPHALANGEAL JOINT;  Surgeon: Cammie Sickle., MD;  Location: [facility];  Service: Orthopedics;  Laterality: Left;  Left long    MOUTH SURGERY     TOTAL HIP ARTHROPLASTY Right 12/03/2020   Procedure: TOTAL HIP ARTHROPLASTY ANTERIOR APPROACH;  Surgeon: Leandrew Koyanagi, MD;  Location: [facility];  Service: Orthopedics;  Laterality: Right;  3-C    Family History  Problem Relation Age of Onset   Hyperlipidemia Father    Hypertension Father    Anxiety disorder Father    Drug abuse Father    Cancer Paternal Grandfather        lung, colon   Colon cancer Paternal Grandfather    Lung cancer Paternal Grandfather    Diabetes Paternal Grandmother    Cancer Paternal Grandmother    Cancer Maternal Grandmother    Cancer Maternal Grandfather    Lung cancer Maternal Grandfather    Stroke Other    Heart disease Other    Depression Paternal Aunt    Anxiety disorder Paternal Aunt    Drug abuse Paternal Uncle    Colon cancer Paternal Uncle    Esophageal cancer Neg Hx    Stomach cancer Neg Hx    Rectal cancer Neg Hx     Allergies  Allergen Reactions   Aspirin Shortness Of Breath and Tinitus    Tinnitus, dizzy, short of breath   Codeine Itching    Reaction to tylenol #3   Penicillins Anaphylaxis and Other (See Comments)    Was told never to take medication.  Has patient had a PCN reaction causing immediate rash, facial/tongue/throat swelling, SOB or lightheadedness with hypotension: no Has patient had a PCN reaction causing severe rash involving mucus membranes or skin necrosis: no Has patient had a PCN reaction that required hospitalization: no Has patient had a PCN reaction occurring within the last 10 years: no If all of the above answers are "NO", then may proceed with Cephalosporin use.    Tramadol     seizure    Amitriptyline Rash     Current Outpatient Medications on File Prior to Visit  Medication Sig Dispense Refill   diphenhydrAMINE (BENADRYL) 25 MG tablet Take 25 mg by mouth every 6 (six) hours as needed for allergies.  docusate sodium (COLACE) 100 MG capsule Take 1 capsule (100 mg total) by mouth daily as needed. To be taken after surgery 30 capsule 2   famotidine (PEPCID) 20 MG tablet Take 20 mg by mouth daily as needed for heartburn.     lidocaine (LIDODERM) 5 % Place 1 patch onto the skin daily. Remove & Discard patch within 12 hours or as directed by MD     lidocaine (XYLOCAINE) 5 % ointment Apply 1 application topically as needed for moderate pain. 50 g 2   Lidocaine 3 % CREA Apply 1 application topically daily as needed (pain).     Multiple Vitamin (MULTIVITAMIN WITH MINERALS) TABS tablet Take 1 tablet by mouth daily.     Nerve Stimulator (TENS THERAPY PAIN RELIEF) DEVI 1 application by Does not apply route daily as needed (chronic pain).     nicotine (NICODERM CQ - DOSED IN MG/24 HOURS) 21 mg/24hr patch Place onto the skin once a day as directed. 28 patch 0   nicotine polacrilex (NICORETTE) 4 MG gum USE AS DIRECTED 110 each 0   ondansetron (ZOFRAN ODT) 4 MG disintegrating tablet Dissolve 1 tablet (4 mg total) by mouth every 8 (eight) hours as needed for nausea or vomiting. 20 tablet 2   ondansetron (ZOFRAN) 4 MG tablet Take 1 tablet (4 mg total) by mouth every 8 (eight) hours as needed for nausea or vomiting. To be taken after surgery 40 tablet 0   oxyCODONE (ROXICODONE) 5 MG immediate release tablet Take 1 tablet (5 mg total) by mouth 4 (four) times daily for 8 days. 32 tablet 0   pantoprazole (PROTONIX) 40 MG tablet Take 1 tablet twice daily 30 to 60 minutes before breakfast and dinner. (Patient taking differently: Take 40 mg by mouth in the morning.) 60 tablet 5   sildenafil (REVATIO) 20 MG tablet TAKE 1 TABLET BY MOUTH 2 TIMES DAILY 180 tablet 1   tiZANidine (ZANAFLEX) 4 MG tablet Take 1 tablet (4 mg  total) by mouth 2 (two) times daily. 180 tablet 0   No current facility-administered medications on file prior to visit.    BP 110/70   Pulse 95   Temp 98.6 F (37 C) (Oral)   Ht 5\' 11"  (1.803 m)   Wt 226 lb (102.5 kg)   SpO2 96%   BMI 31.52 kg/m       Objective:   Physical Exam Vitals and nursing note reviewed.  Constitutional:      Appearance: Normal appearance.  Cardiovascular:     Rate and Rhythm: Normal rate and regular rhythm.     Pulses: Normal pulses.     Heart sounds: Normal heart sounds.  Pulmonary:     Effort: Pulmonary effort is normal.     Breath sounds: Normal breath sounds.  Musculoskeletal:        General: Normal range of motion.  Skin:    General: Skin is warm and dry.  Neurological:     General: No focal deficit present.     Mental Status: He is alert and oriented to person, place, and time.  Psychiatric:        Mood and Affect: Mood normal.        Behavior: Behavior normal.        Thought Content: Thought content normal.        Judgment: Judgment normal.      Assessment & Plan:  1. Chronic pain syndrome -Needs to have pain management visit set up by February. - DRUG  MONITOR, PANEL 1, W/CONF, URINE; Future - nortriptyline (PAMELOR) 75 MG capsule; Take 1 capsule (75 mg total) by mouth at bedtime.  Dispense: 90 capsule; Refill: 0 - gabapentin (NEURONTIN) 300 MG capsule; Take 1 capsule (300 mg total) by mouth 3 (three) times daily.  Dispense: 270 capsule; Refill: 0  2. Complex regional pain syndrome type 1 of lower extremity, unspecified laterality  - DRUG MONITOR, PANEL 1, W/CONF, URINE; Future - nortriptyline (PAMELOR) 75 MG capsule; Take 1 capsule (75 mg total) by mouth at bedtime.  Dispense: 90 capsule; Refill: 0 - gabapentin (NEURONTIN) 300 MG capsule; Take 1 capsule (300 mg total) by mouth 3 (three) times daily.  Dispense: 270 capsule; Refill: 0  Dorothyann Peng, NP

## 2021-02-21 ENCOUNTER — Other Ambulatory Visit (HOSPITAL_COMMUNITY): Payer: Self-pay

## 2021-02-21 MED ORDER — SILDENAFIL CITRATE 20 MG PO TABS
ORAL_TABLET | Freq: Two times a day (BID) | ORAL | 1 refills | Status: DC
  Filled 2021-02-21: qty 180, 90d supply, fill #0
  Filled 2021-06-21: qty 180, 90d supply, fill #1

## 2021-02-24 LAB — DM TEMPLATE

## 2021-02-24 LAB — DRUG MONITOR, PANEL 1, W/CONF, URINE
Amphetamines: NEGATIVE ng/mL (ref <500)
Barbiturates: NEGATIVE ng/mL (ref <300)
Benzodiazepines: NEGATIVE ng/mL (ref <100)
Cocaine Metabolite: NEGATIVE ng/mL (ref <150)
Codeine: NEGATIVE ng/mL (ref <50)
Creatinine: 212.4 mg/dL (ref >=20.0)
EDDP: NEGATIVE ng/mL (ref <100)
Hydrocodone: NEGATIVE ng/mL (ref <50)
Hydromorphone: NEGATIVE ng/mL (ref <50)
Marijuana Metabolite: NEGATIVE ng/mL (ref <20)
Methadone Metabolite: NEGATIVE ng/mL (ref <100)
Methadone: NEGATIVE ng/mL (ref <100)
Morphine: NEGATIVE ng/mL (ref <50)
Norhydrocodone: NEGATIVE ng/mL (ref <50)
Noroxycodone: >10000 ng/mL — ABNORMAL HIGH (ref <50)
Opiates: NEGATIVE ng/mL (ref <100)
Oxidant: NEGATIVE ug/mL (ref <200)
Oxycodone: >10000 ng/mL — ABNORMAL HIGH (ref <50)
Oxycodone: POSITIVE ng/mL — AB (ref <100)
Oxymorphone: 3974 ng/mL — ABNORMAL HIGH (ref <50)
Phencyclidine: NEGATIVE ng/mL (ref <25)
pH: 5.3 (ref 4.5–9.0)

## 2021-02-26 ENCOUNTER — Encounter: Payer: Self-pay | Admitting: Adult Health

## 2021-02-26 ENCOUNTER — Other Ambulatory Visit (HOSPITAL_COMMUNITY): Payer: Self-pay

## 2021-02-26 ENCOUNTER — Other Ambulatory Visit: Payer: Self-pay | Admitting: Adult Health

## 2021-02-26 ENCOUNTER — Other Ambulatory Visit: Payer: Self-pay

## 2021-02-26 ENCOUNTER — Ambulatory Visit (INDEPENDENT_AMBULATORY_CARE_PROVIDER_SITE_OTHER): Payer: No Typology Code available for payment source | Admitting: Orthopaedic Surgery

## 2021-02-26 ENCOUNTER — Other Ambulatory Visit: Payer: Self-pay | Admitting: Pharmacist

## 2021-02-26 DIAGNOSIS — M25511 Pain in right shoulder: Secondary | ICD-10-CM

## 2021-02-26 DIAGNOSIS — G8929 Other chronic pain: Secondary | ICD-10-CM

## 2021-02-26 DIAGNOSIS — Z96641 Presence of right artificial hip joint: Secondary | ICD-10-CM | POA: Diagnosis not present

## 2021-02-26 MED ORDER — METHYLPREDNISOLONE ACETATE 40 MG/ML IJ SUSP
40.0000 mg | INTRAMUSCULAR | Status: AC | PRN
  Administered 2021-02-26: 40 mg via INTRA_ARTICULAR

## 2021-02-26 MED ORDER — OXYCODONE HCL 5 MG PO TABS
5.0000 mg | ORAL_TABLET | Freq: Four times a day (QID) | ORAL | 0 refills | Status: DC
  Filled 2021-02-26: qty 120, 30d supply, fill #0

## 2021-02-26 MED ORDER — LIDOCAINE HCL 1 % IJ SOLN
3.0000 mL | INTRAMUSCULAR | Status: AC | PRN
  Administered 2021-02-26: 3 mL

## 2021-02-26 MED ORDER — BUPIVACAINE HCL 0.5 % IJ SOLN
3.0000 mL | INTRAMUSCULAR | Status: AC | PRN
  Administered 2021-02-26: 3 mL via INTRA_ARTICULAR

## 2021-02-26 NOTE — Progress Notes (Signed)
Office Visit Note   Patient: Shane Cole.           Date of Birth: 05-03-1980           MRN: [id number] Visit Date: 02/26/2021              Requested by: Shirline Frees, NP [address] [place],  [place] [address] PCP: Shirline Frees, NP   Assessment & Plan: Visit Diagnoses:  1. Status post total replacement of right hip   2. Chronic right shoulder pain     Plan: [name] is 3 months status post right total hip replacement for avascular necrosis.  Overall doing well and would like to return back to work doing 40 hours a week.  He has some occasional groin pain with increased activity.  Feels like he still walks with a limp at times.  He is also here for evaluation of unrelated problem of right shoulder pain.  Denies any injuries.  He has had a history of adhesive capsulitis in the past.  He had an x-ray of the right shoulder in May which was normal.  He feels pain and clicking in the lateral and anterior portion of the shoulder.  Right hip shows a fully healed surgical scar.  He has excellent range of motion without pain. Right shoulder shows some clicking and popping with active and passive range of motion over the anterior aspect of the shoulder.  No significant tenderness of the bicipital groove.  Negative Speed.  Negative O'Brien.  Positive crank test.  Negative empty can.  Positive Hawkins sign.  Range of motion is relatively well-preserved.  In regards to the right hip replacement he has done well and we will release him back to full-time work.  For the right shoulder he requested a subacromial injection today.  If the relief is short-lived we may need to consider MRI.  Follow-Up Instructions: Return in about 3 months (around 05/27/2021).   Orders:  Orders Placed This Encounter  Procedures   Large Joint Inj   No orders of the defined types were placed in this encounter.     Procedures: Large Joint Inj: R subacromial bursa on 02/26/2021 4:15 PM Indications:  pain Details: 22 G needle  Arthrogram: No  Medications: 3 mL lidocaine 1 %; 3 mL bupivacaine 0.5 %; 40 mg methylPREDNISolone acetate 40 MG/ML Outcome: tolerated well, no immediate complications Consent was given by the patient. Patient was prepped and draped in the usual sterile fashion.      Clinical Data: No additional findings.   Subjective: Chief Complaint  Patient presents with   Right Hip - Follow-up    HPI  Review of Systems   Objective: Vital Signs: There were no vitals taken for this visit.  Physical Exam  Ortho Exam  Specialty Comments:  No specialty comments available.  Imaging: No results found.   PMFS History: Patient Active Problem List   Diagnosis Date Noted   Status post total replacement of right hip 12/03/2020   Pharmacologic therapy 07/30/2020   Disorder of skeletal system 07/30/2020   Problems influencing health status 07/30/2020   Reflex sympathetic dystrophy of lower extremity (Left) 07/30/2020   Complex regional pain syndrome I of lower limb (Left) 07/30/2020   Avascular necrosis of hip (Right) (HCC) 07/30/2020   Chronic hip pain (Right) 07/30/2020   Chronic low back pain (2ry area of Pain) (Bilateral) w/o sciatica 07/30/2020   Chronic shoulder pain (3ry area of Pain) (Right) 07/30/2020   Auricular  cyst 01/21/2018   Laryngopharyngeal reflux (LPR) 01/21/2018   GERD (gastroesophageal reflux disease) 07/16/2016   Internal hemorrhoid 07/16/2016   Abdominal pain, chronic, epigastric 07/16/2016   Rectal bleeding 07/16/2016   Essential hypertension 02/26/2016   Adhesive capsulitis of right shoulder 07/16/2015   Thoracic outlet syndrome 02/12/2015   Chronic narcotic use 10/26/2014   Opioid contract exists 10/26/2014   Smoker 05/11/2014   Coccygeal pain, acute 05/18/2012   Chronic pain syndrome 05/18/2012   Chronic diarrhea 03/29/2012   Bipolar disorder (HCC) 03/29/2012   Chronic foot pain (1ry area of Pain) (Left) 06/11/2011    Past Medical History:  Diagnosis Date   Allergy    Anxiety    Bipolar disorder (HCC)    Bronchitis    Bronchitis    Crush injury lower leg    Left lower leg   Depression    Gastric ulcer    GERD (gastroesophageal reflux disease)    will awaken him in night   History of hiatal hernia    Hypertension    Migraine    Nerve pain    RSD (reflex sympathetic dystrophy)     Family History  Problem Relation Age of Onset   Hyperlipidemia Father    Hypertension Father    Anxiety disorder Father    Drug abuse Father    Cancer Paternal Grandfather        lung, colon   Colon cancer Paternal Grandfather    Lung cancer Paternal Grandfather    Diabetes Paternal Grandmother    Cancer Paternal Grandmother    Cancer Maternal Grandmother    Cancer Maternal Grandfather    Lung cancer Maternal Grandfather    Stroke Other    Heart disease Other    Depression Paternal Aunt    Anxiety disorder Paternal Aunt    Drug abuse Paternal Uncle    Colon cancer Paternal Uncle    Esophageal cancer Neg Hx    Stomach cancer Neg Hx    Rectal cancer Neg Hx     Past Surgical History:  Procedure Laterality Date   CHOLECYSTECTOMY N/A 01/24/2014   Procedure: LAPAROSCOPIC CHOLECYSTECTOMY;  Surgeon: Axel Filler, MD;  [facility];  Service: General;  Laterality: N/A;   COLONOSCOPY     HAND SURGERY     MASS EXCISION Left 01/25/2013   Procedure: EXCISION MASS DORSAL ASPECT LEFT LONG FINGER DISTAL INTERPHALANGEAL JOINT;  Surgeon: Wyn Forster., MD;  Location: [facility];  Service: Orthopedics;  Laterality: Left;  Left long    MOUTH SURGERY     TOTAL HIP ARTHROPLASTY Right 12/03/2020   Procedure: TOTAL HIP ARTHROPLASTY ANTERIOR APPROACH;  Surgeon: Tarry Kos, MD;  Location[facility];  Service: Orthopedics;  Laterality: Right;  3-C   Social History   Occupational History   Occupation: Disabled    Comment: Previously worked Web designer  Tobacco Use   Smoking  status: Every Day    Packs/day: 0.25    Years: 23.00    Pack years: 5.75    Types: Cigarettes   Smokeless tobacco: Never   Tobacco comments:    Using nicotine patch  Vaping Use   Vaping Use: Never used  Substance and Sexual Activity   Alcohol use: Yes    Alcohol/week: 6.0 standard drinks    Types: 6 Cans of beer per week    Comment: occasional   Drug use: No   Sexual activity: Yes    Partners: Female

## 2021-02-26 NOTE — Telephone Encounter (Signed)
Please advise 

## 2021-02-27 ENCOUNTER — Other Ambulatory Visit (HOSPITAL_COMMUNITY): Payer: Self-pay

## 2021-02-27 ENCOUNTER — Ambulatory Visit: Payer: No Typology Code available for payment source | Admitting: Pain Medicine

## 2021-02-27 ENCOUNTER — Other Ambulatory Visit: Payer: Self-pay | Admitting: Adult Health

## 2021-02-27 IMAGING — CR DG HIP (WITH OR WITHOUT PELVIS) 2-3V*R*
1 series · 3 of 3 positions shown · non-contrast
Comparison: None.

CLINICAL DATA: Right hip pain

EXAM:
DG HIP (WITH OR WITHOUT PELVIS) 2-3V RIGHT

[Series 1: dg hip unilat w or w/o pelvis 2-3 views  · non-contrast · 0.14mm/px · 3 of 3 slices shown]
[im 1/3]
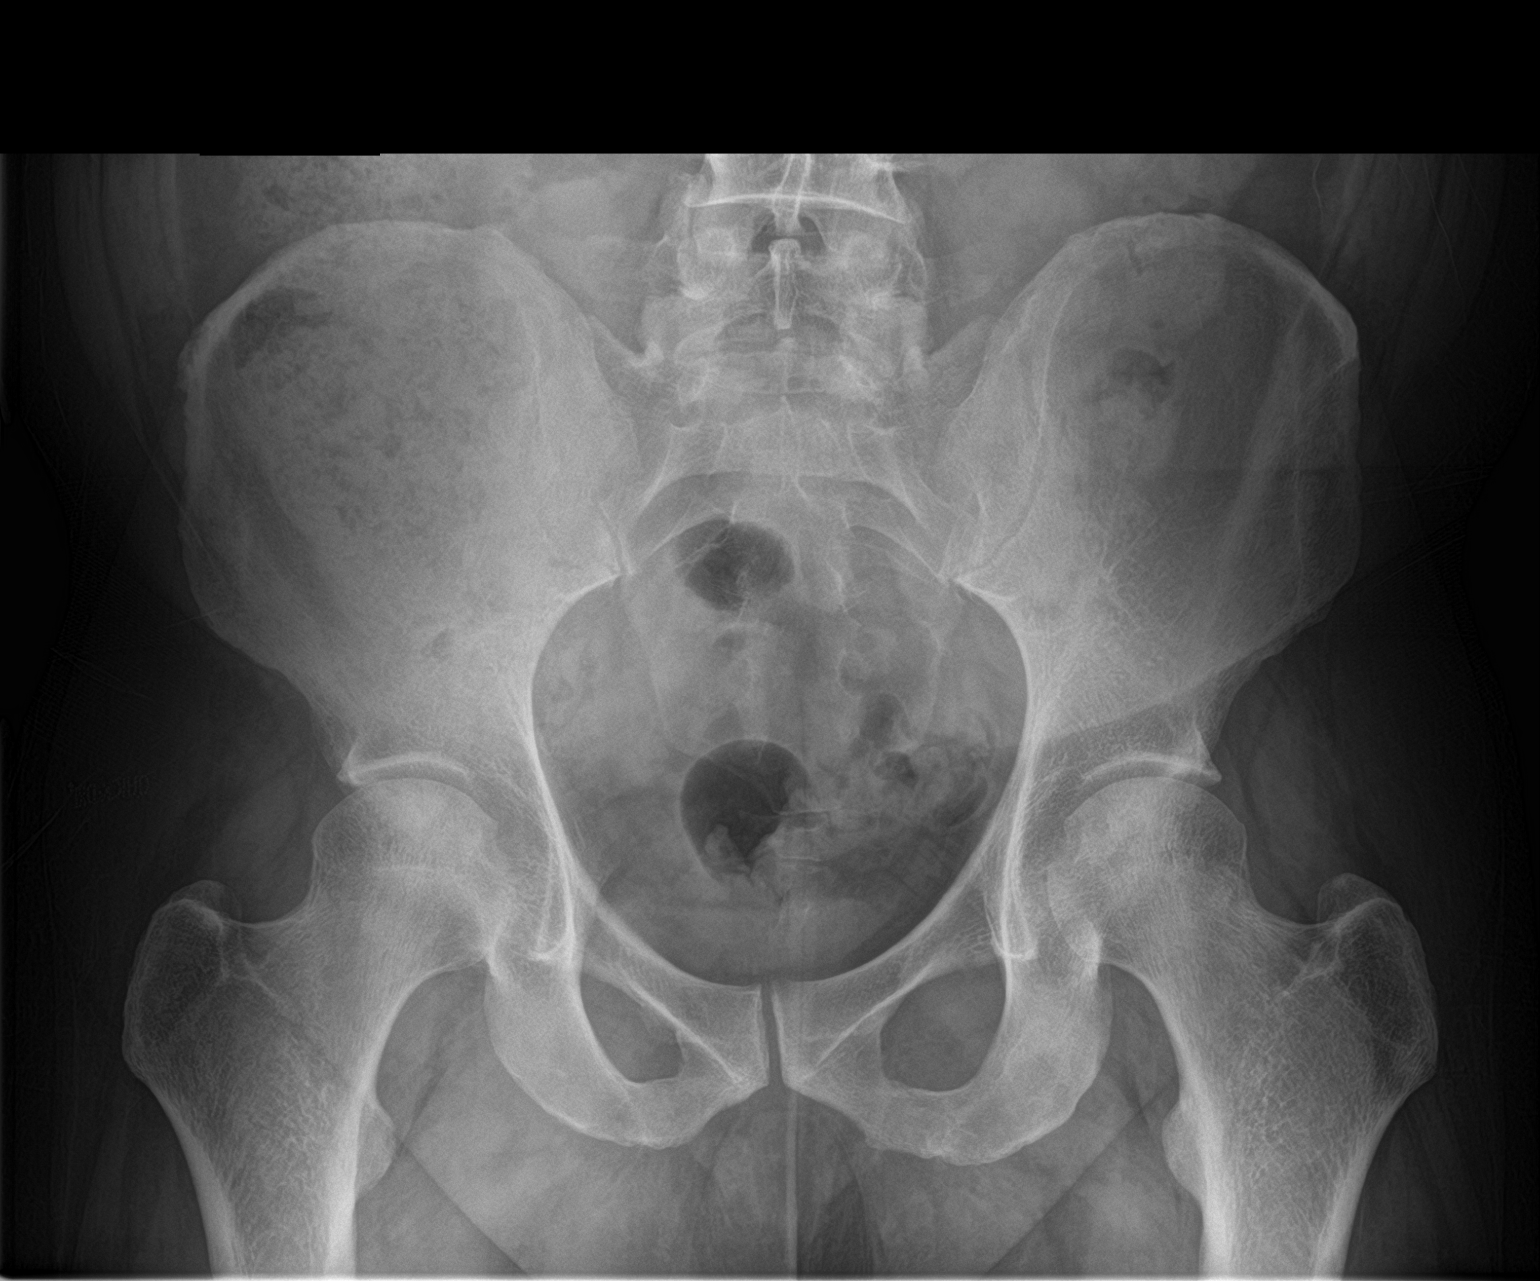
[im 2/3]
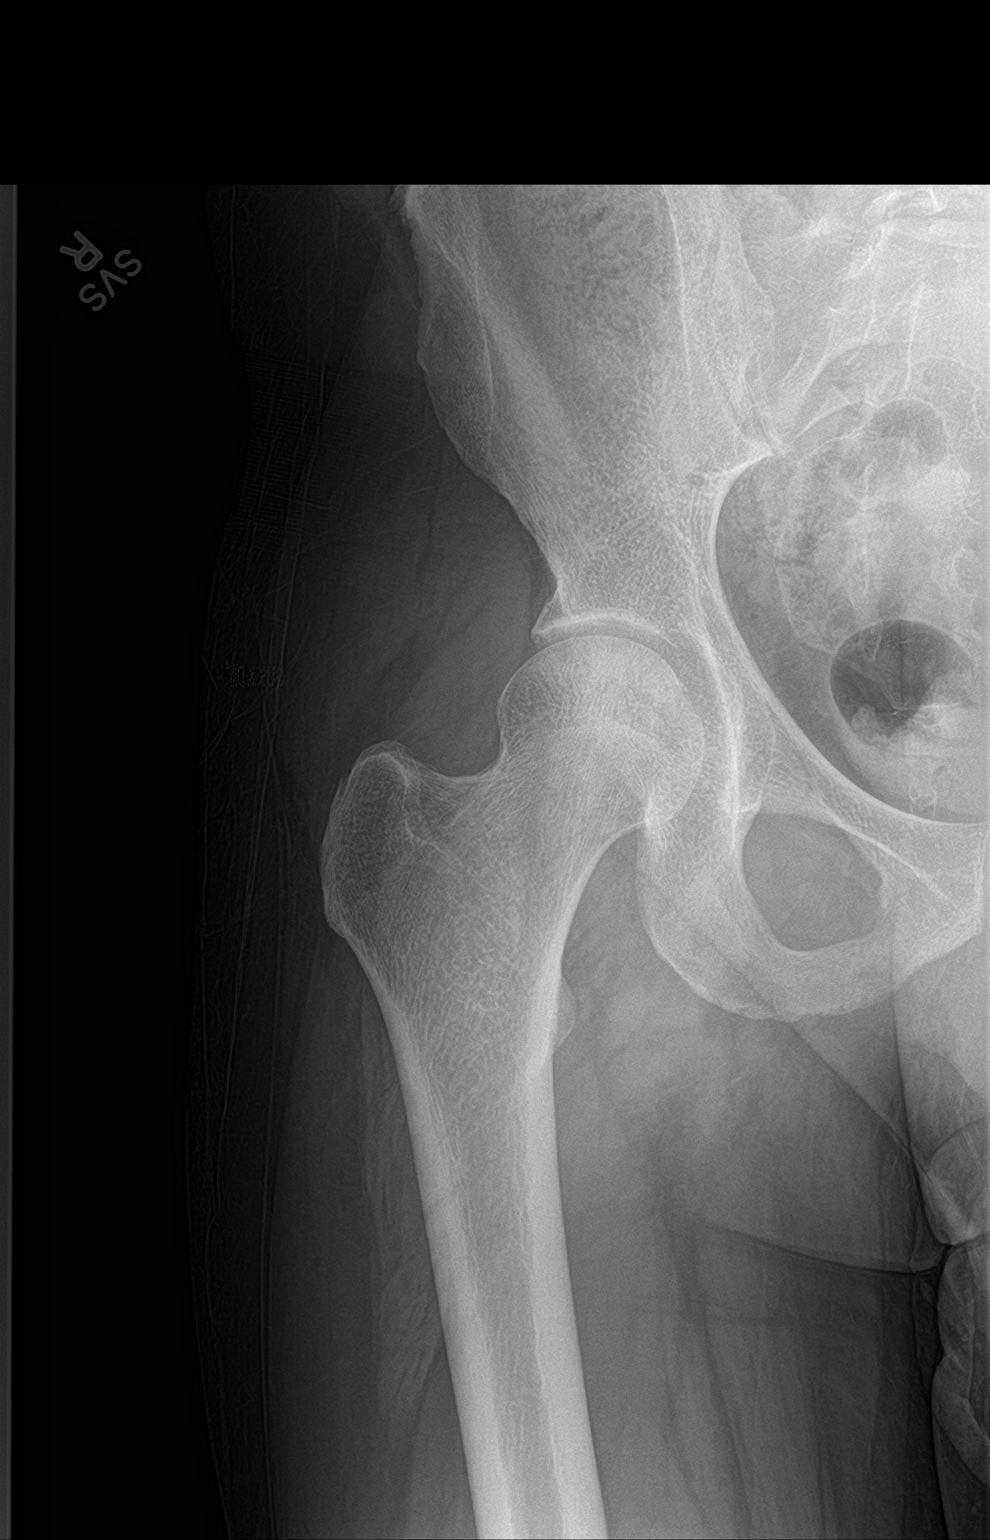
[im 3/3]
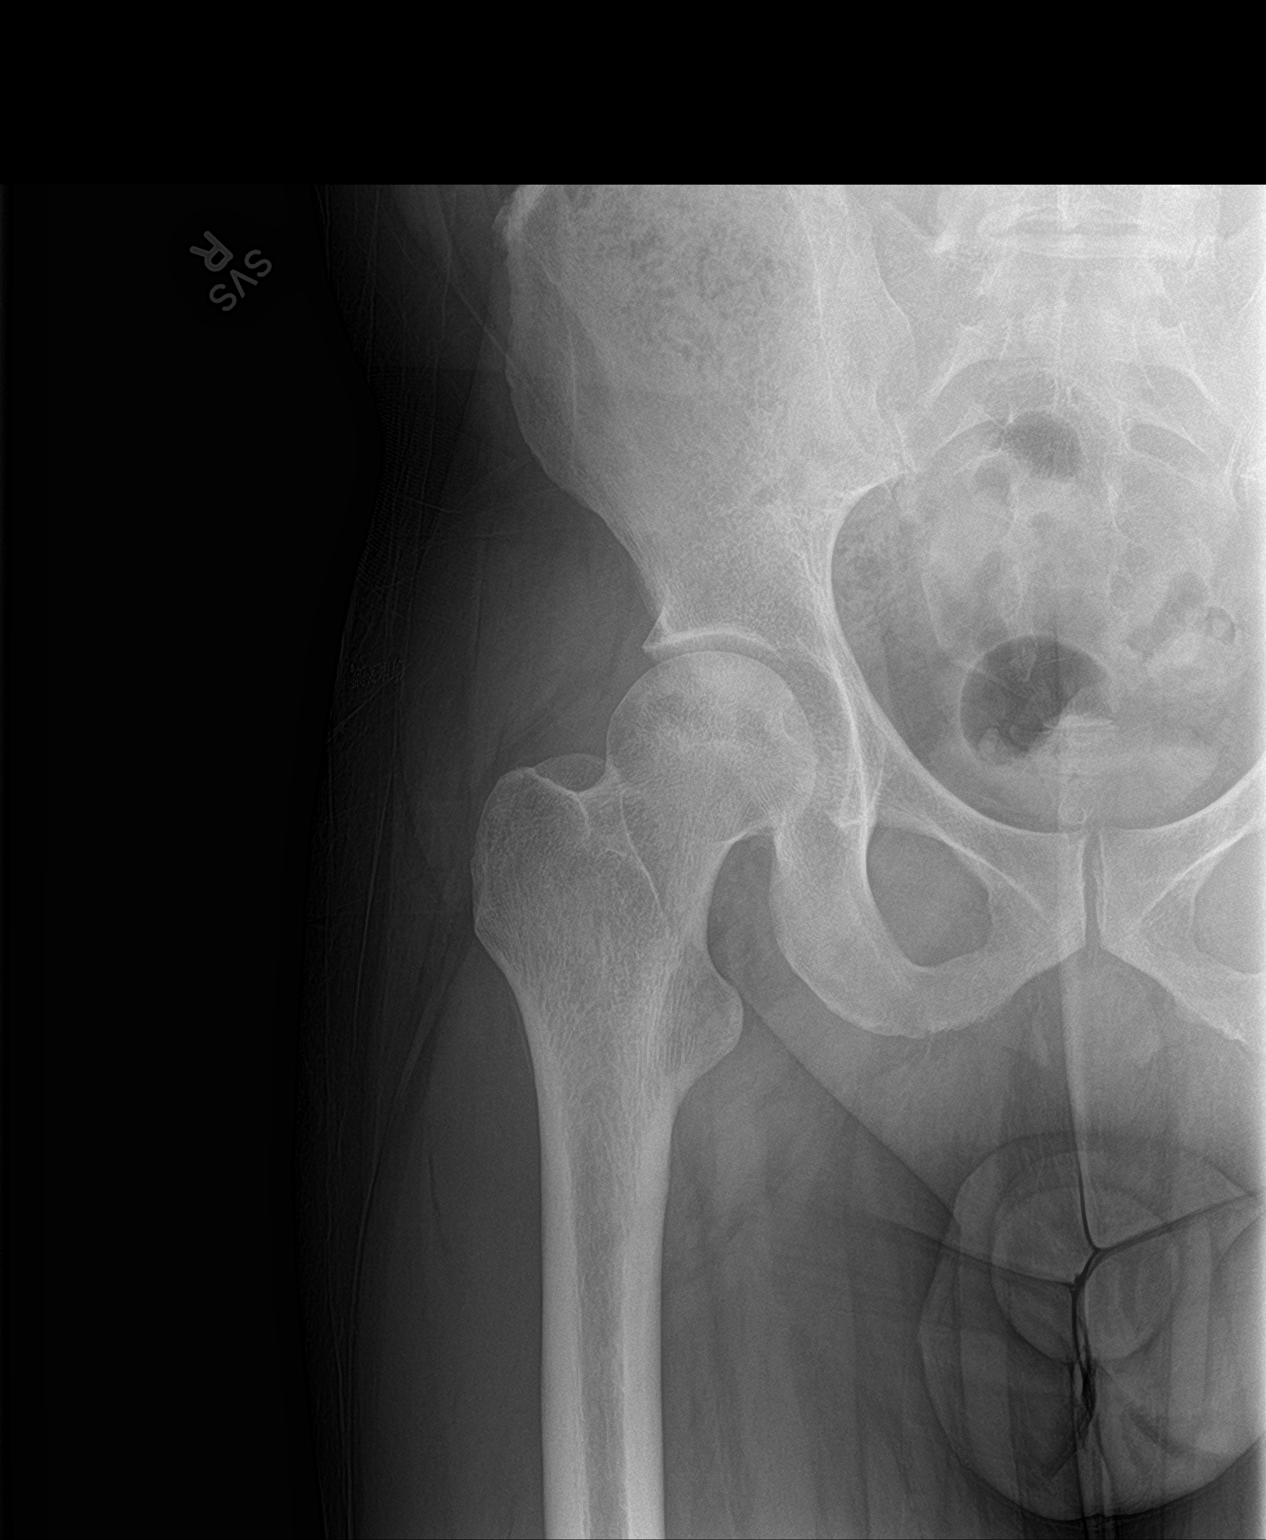

[3 of 3 positions shown; findings below may reference images not displayed]

FINDINGS: There is no evidence of hip fracture or dislocation. There is no
evidence of arthropathy or other focal bone abnormality.
IMPRESSION: No acute osseous injury of the right hip.

## 2021-02-27 MED ORDER — NICOTINE 21 MG/24HR TD PT24
MEDICATED_PATCH | TRANSDERMAL | 0 refills | Status: DC
  Filled 2021-02-27: qty 28, 28d supply, fill #0

## 2021-02-27 MED ORDER — LIDOCAINE 5 % EX PTCH
1.0000 | MEDICATED_PATCH | CUTANEOUS | 0 refills | Status: DC
  Filled 2021-02-27: qty 30, 30d supply, fill #0

## 2021-02-28 ENCOUNTER — Ambulatory Visit: Payer: Self-pay | Admitting: Psychology

## 2021-03-01 ENCOUNTER — Other Ambulatory Visit (HOSPITAL_COMMUNITY): Payer: Self-pay

## 2021-03-20 ENCOUNTER — Other Ambulatory Visit: Payer: Self-pay | Admitting: Adult Health

## 2021-03-20 ENCOUNTER — Other Ambulatory Visit (HOSPITAL_COMMUNITY): Payer: Self-pay

## 2021-03-21 ENCOUNTER — Other Ambulatory Visit (HOSPITAL_COMMUNITY): Payer: Self-pay

## 2021-03-21 MED ORDER — NICOTINE 21 MG/24HR TD PT24
MEDICATED_PATCH | TRANSDERMAL | 0 refills | Status: DC
  Filled 2021-03-21: qty 28, 28d supply, fill #0

## 2021-03-21 MED ORDER — LIDOCAINE 5 % EX PTCH
1.0000 | MEDICATED_PATCH | CUTANEOUS | 1 refills | Status: DC
  Filled 2021-03-21 – 2021-03-28 (×2): qty 30, 30d supply, fill #0
  Filled 2021-04-26: qty 30, 30d supply, fill #1

## 2021-03-22 ENCOUNTER — Other Ambulatory Visit (HOSPITAL_COMMUNITY): Payer: Self-pay

## 2021-03-25 ENCOUNTER — Other Ambulatory Visit (HOSPITAL_COMMUNITY): Payer: Self-pay

## 2021-03-26 ENCOUNTER — Other Ambulatory Visit: Payer: Self-pay | Admitting: Adult Health

## 2021-03-26 ENCOUNTER — Other Ambulatory Visit (HOSPITAL_COMMUNITY): Payer: Self-pay

## 2021-03-26 MED ORDER — OXYCODONE HCL 5 MG PO TABS
5.0000 mg | ORAL_TABLET | Freq: Four times a day (QID) | ORAL | 0 refills | Status: DC
  Filled 2021-03-26 – 2021-03-28 (×2): qty 120, 30d supply, fill #0

## 2021-03-26 NOTE — Telephone Encounter (Signed)
Okay for refill?  

## 2021-03-27 ENCOUNTER — Other Ambulatory Visit (HOSPITAL_COMMUNITY): Payer: Self-pay

## 2021-03-28 ENCOUNTER — Other Ambulatory Visit (HOSPITAL_COMMUNITY): Payer: Self-pay

## 2021-03-29 ENCOUNTER — Other Ambulatory Visit (HOSPITAL_COMMUNITY): Payer: Self-pay

## 2021-04-02 ENCOUNTER — Ambulatory Visit: Payer: No Typology Code available for payment source | Admitting: Adult Health

## 2021-04-08 ENCOUNTER — Other Ambulatory Visit (HOSPITAL_COMMUNITY): Payer: Self-pay

## 2021-04-08 MED ORDER — CARESTART COVID-19 HOME TEST VI KIT
PACK | 0 refills | Status: DC
  Filled 2021-04-08: qty 4, 4d supply, fill #0

## 2021-04-09 ENCOUNTER — Ambulatory Visit: Payer: No Typology Code available for payment source | Admitting: Adult Health

## 2021-04-12 ENCOUNTER — Telehealth: Payer: No Typology Code available for payment source | Admitting: Emergency Medicine

## 2021-04-12 ENCOUNTER — Other Ambulatory Visit (HOSPITAL_COMMUNITY): Payer: Self-pay

## 2021-04-12 DIAGNOSIS — J029 Acute pharyngitis, unspecified: Secondary | ICD-10-CM | POA: Diagnosis not present

## 2021-04-12 MED ORDER — CLINDAMYCIN HCL 150 MG PO CAPS
300.0000 mg | ORAL_CAPSULE | Freq: Three times a day (TID) | ORAL | 0 refills | Status: DC
  Filled 2021-04-12: qty 60, 10d supply, fill #0

## 2021-04-12 NOTE — Progress Notes (Signed)
E-Visit for Sore Throat - Strep Symptoms  We are sorry that you are not feeling well.  Here is how we plan to help!  Based on what you have shared with me it is likely that you have strep pharyngitis.  Strep pharyngitis is inflammation and infection in the back of the throat.  This is an infection cause by bacteria and is treated with antibiotics.  I have prescribed Clindamycin 300 mg three times a day for 10 days. For throat pain, we recommend over the counter oral pain relief medications such as acetaminophen or aspirin, or anti-inflammatory medications such as ibuprofen or naproxen sodium. Topical treatments such as oral throat lozenges or sprays may be used as needed. Strep infections are not as easily transmitted as other respiratory infections, however we still recommend that you avoid close contact with loved ones, especially the very young and elderly.  Remember to wash your hands thoroughly throughout the day as this is the number one way to prevent the spread of infection and wipe down door knobs and counters with disinfectant.   Home Care: Only take medications as instructed by your medical team. Complete the entire course of an antibiotic. Do not take these medications with alcohol. A steam or ultrasonic humidifier can help congestion.  You can place a towel over your head and breathe in the steam from hot water coming from a faucet. Avoid close contacts especially the very young and the elderly. Cover your mouth when you cough or sneeze. Always remember to wash your hands.  Get Help Right Away If: You develop worsening fever or sinus pain. You develop a severe head ache or visual changes. Your symptoms persist after you have completed your treatment plan.  Make sure you Understand these instructions. Will watch your condition. Will get help right away if you are not doing well or get worse.   Thank you for choosing an e-visit.  Your e-visit answers were reviewed by a board  certified advanced clinical practitioner to complete your personal care plan. Depending upon the condition, your plan could have included both over the counter or prescription medications.  Please review your pharmacy choice. Make sure the pharmacy is open so you can pick up prescription now. If there is a problem, you may contact your provider through [employer] and have the prescription routed to another pharmacy.  Your safety is important to [place]. If you have drug allergies check your prescription carefully.   For the next 24 hours you can use MyChart to ask questions about today's visit, request a non-urgent call back, or ask for a work or school excuse. You will get an email in the next two days asking about your experience. I hope that your e-visit has been valuable and will speed your recovery.   Approximately 5 minutes was used in reviewing the patient's chart, questionnaire, prescribing medications, and documentation.

## 2021-04-16 ENCOUNTER — Other Ambulatory Visit (HOSPITAL_COMMUNITY): Payer: Self-pay

## 2021-04-16 ENCOUNTER — Other Ambulatory Visit: Payer: Self-pay | Admitting: Adult Health

## 2021-04-16 DIAGNOSIS — G894 Chronic pain syndrome: Secondary | ICD-10-CM

## 2021-04-17 ENCOUNTER — Other Ambulatory Visit (HOSPITAL_COMMUNITY): Payer: Self-pay

## 2021-04-17 MED ORDER — TIZANIDINE HCL 4 MG PO TABS
4.0000 mg | ORAL_TABLET | Freq: Two times a day (BID) | ORAL | 0 refills | Status: DC
  Filled 2021-04-17: qty 180, 90d supply, fill #0

## 2021-04-25 ENCOUNTER — Other Ambulatory Visit: Payer: Self-pay

## 2021-04-25 ENCOUNTER — Other Ambulatory Visit (HOSPITAL_COMMUNITY): Payer: Self-pay

## 2021-04-25 ENCOUNTER — Other Ambulatory Visit: Payer: Self-pay | Admitting: Adult Health

## 2021-04-25 MED ORDER — IBUPROFEN 800 MG PO TABS
ORAL_TABLET | ORAL | 6 refills | Status: DC
  Filled 2021-04-25: qty 90, 30d supply, fill #0
  Filled 2021-06-11: qty 90, 30d supply, fill #1
  Filled 2021-07-19: qty 90, 30d supply, fill #2
  Filled 2021-08-29: qty 90, 30d supply, fill #3
  Filled 2021-10-07: qty 90, 30d supply, fill #4
  Filled 2021-11-29: qty 90, 30d supply, fill #5
  Filled 2021-12-26: qty 90, 30d supply, fill #6

## 2021-04-26 ENCOUNTER — Other Ambulatory Visit: Payer: Self-pay | Admitting: Adult Health

## 2021-04-26 ENCOUNTER — Encounter: Payer: Self-pay | Admitting: Adult Health

## 2021-04-26 ENCOUNTER — Other Ambulatory Visit (HOSPITAL_COMMUNITY): Payer: Self-pay

## 2021-04-26 MED ORDER — OXYCODONE HCL 5 MG PO TABS
5.0000 mg | ORAL_TABLET | Freq: Four times a day (QID) | ORAL | 0 refills | Status: DC
  Filled 2021-04-26: qty 56, 14d supply, fill #0

## 2021-04-26 NOTE — Telephone Encounter (Signed)
Can you refuse this? Im not able to

## 2021-04-26 NOTE — Telephone Encounter (Signed)
Please advise 

## 2021-04-27 ENCOUNTER — Other Ambulatory Visit (HOSPITAL_COMMUNITY): Payer: Self-pay

## 2021-05-03 ENCOUNTER — Other Ambulatory Visit: Payer: Self-pay

## 2021-05-03 ENCOUNTER — Ambulatory Visit (INDEPENDENT_AMBULATORY_CARE_PROVIDER_SITE_OTHER): Payer: No Typology Code available for payment source | Admitting: Adult Health

## 2021-05-03 ENCOUNTER — Ambulatory Visit (INDEPENDENT_AMBULATORY_CARE_PROVIDER_SITE_OTHER): Payer: No Typology Code available for payment source

## 2021-05-03 ENCOUNTER — Encounter: Payer: Self-pay | Admitting: Adult Health

## 2021-05-03 ENCOUNTER — Ambulatory Visit: Payer: No Typology Code available for payment source

## 2021-05-03 VITALS — BP 128/60 | HR 94 | Temp 99.0°F | Ht 71.0 in | Wt 220.0 lb

## 2021-05-03 DIAGNOSIS — M130 Polyarthritis, unspecified: Secondary | ICD-10-CM

## 2021-05-03 IMAGING — DX DG KNEE 1-2V*L*
2 series · 2 of 2 positions shown · non-contrast
Comparison: None.

CLINICAL DATA: Left knee pain.

EXAM:
LEFT KNEE - 1-2 VIEW

[knee ap]
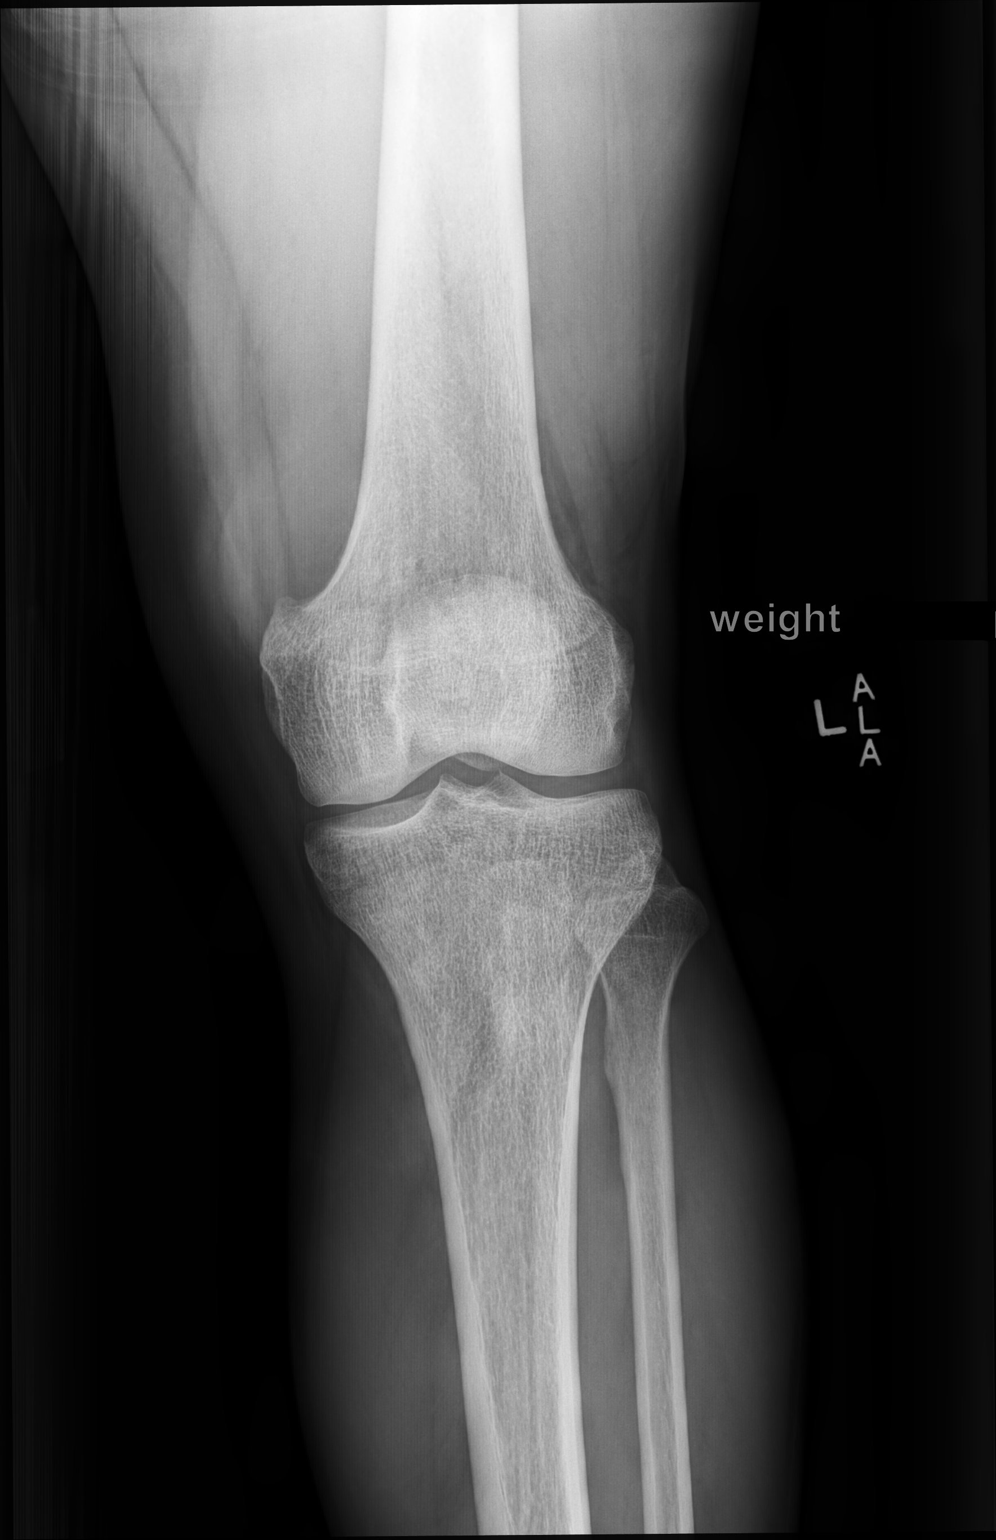

[knee lat]
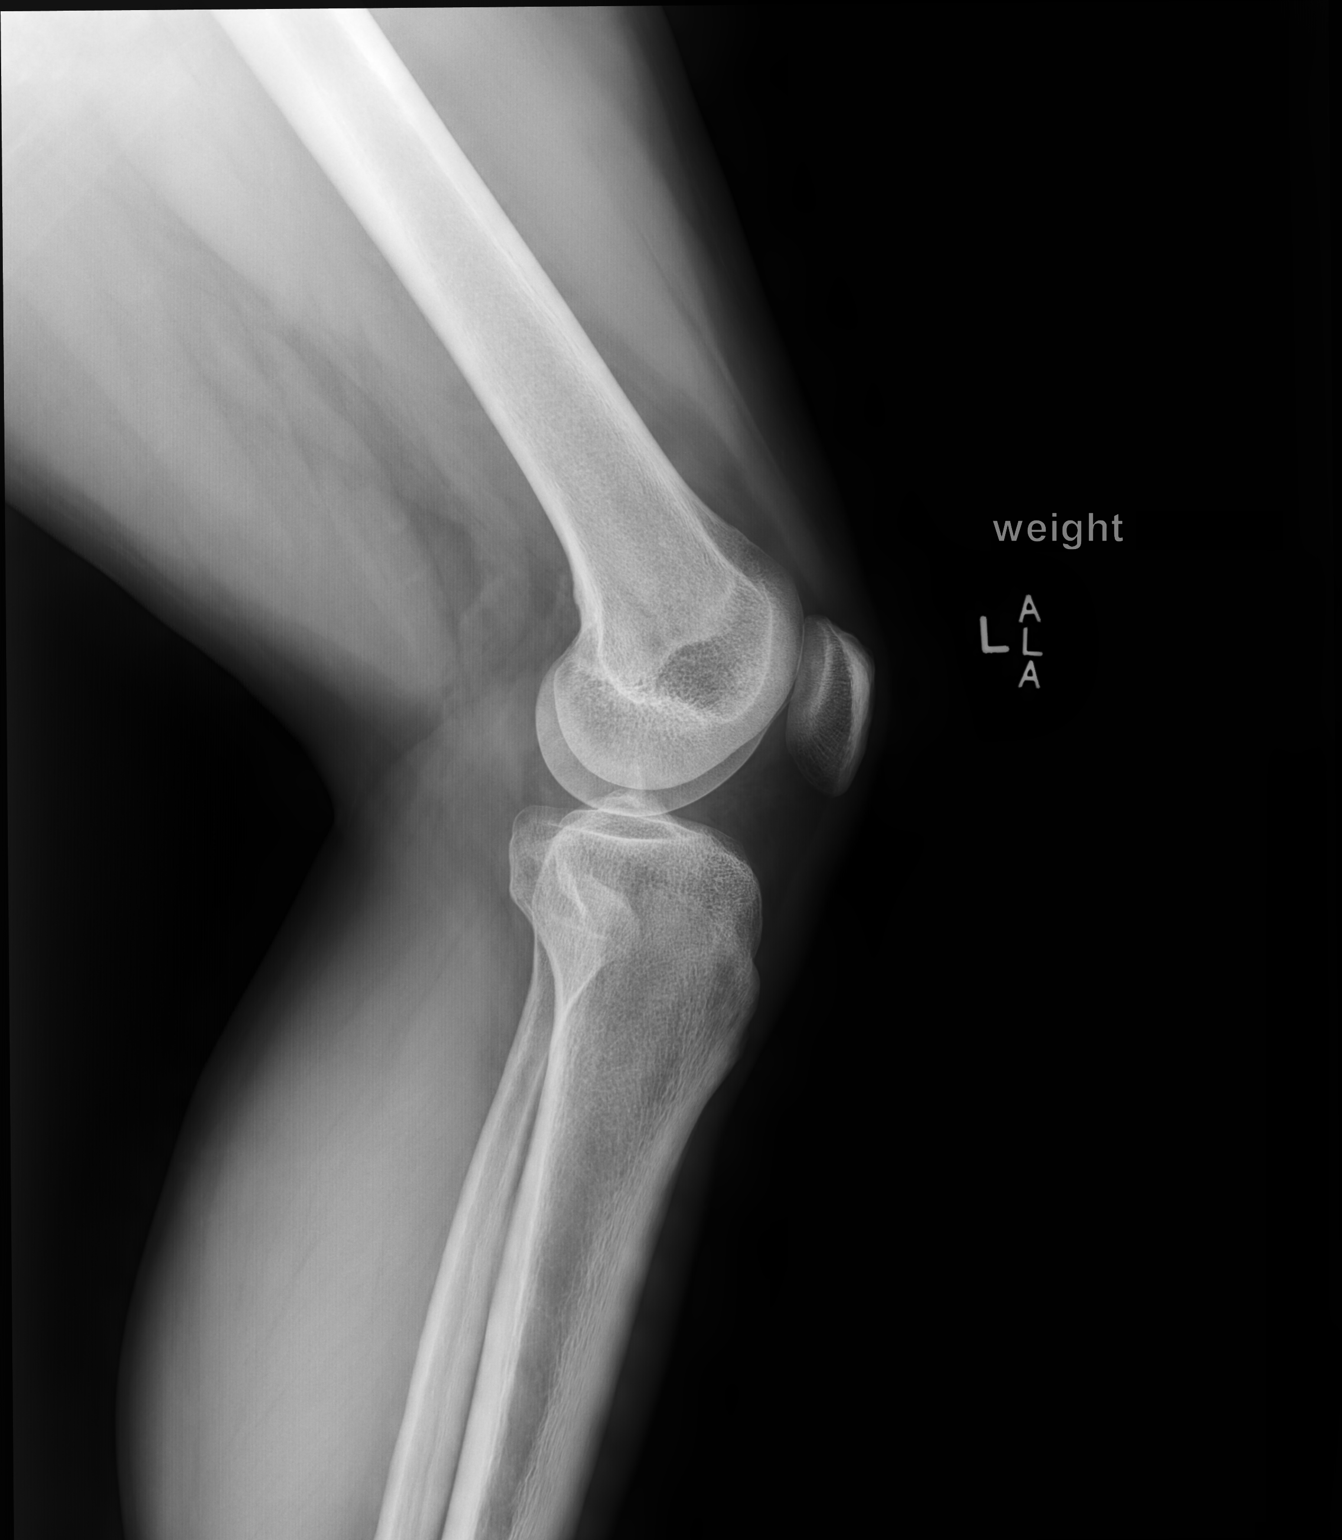

[2 of 2 positions shown; findings below may reference images not displayed]

FINDINGS: No joint effusion. Joint spaces are preserved. No acute fracture is
seen. No dislocation.
IMPRESSION: Negative.

## 2021-05-03 IMAGING — DX DG WRIST COMPLETE 3+V*L*
3 series · 3 of 3 positions shown · non-contrast
Comparison: Left hand radiographs [DATE]

CLINICAL DATA: Left wrist pain and swelling.

EXAM:
LEFT WRIST - COMPLETE 3+ VIEW

[wrist pa]
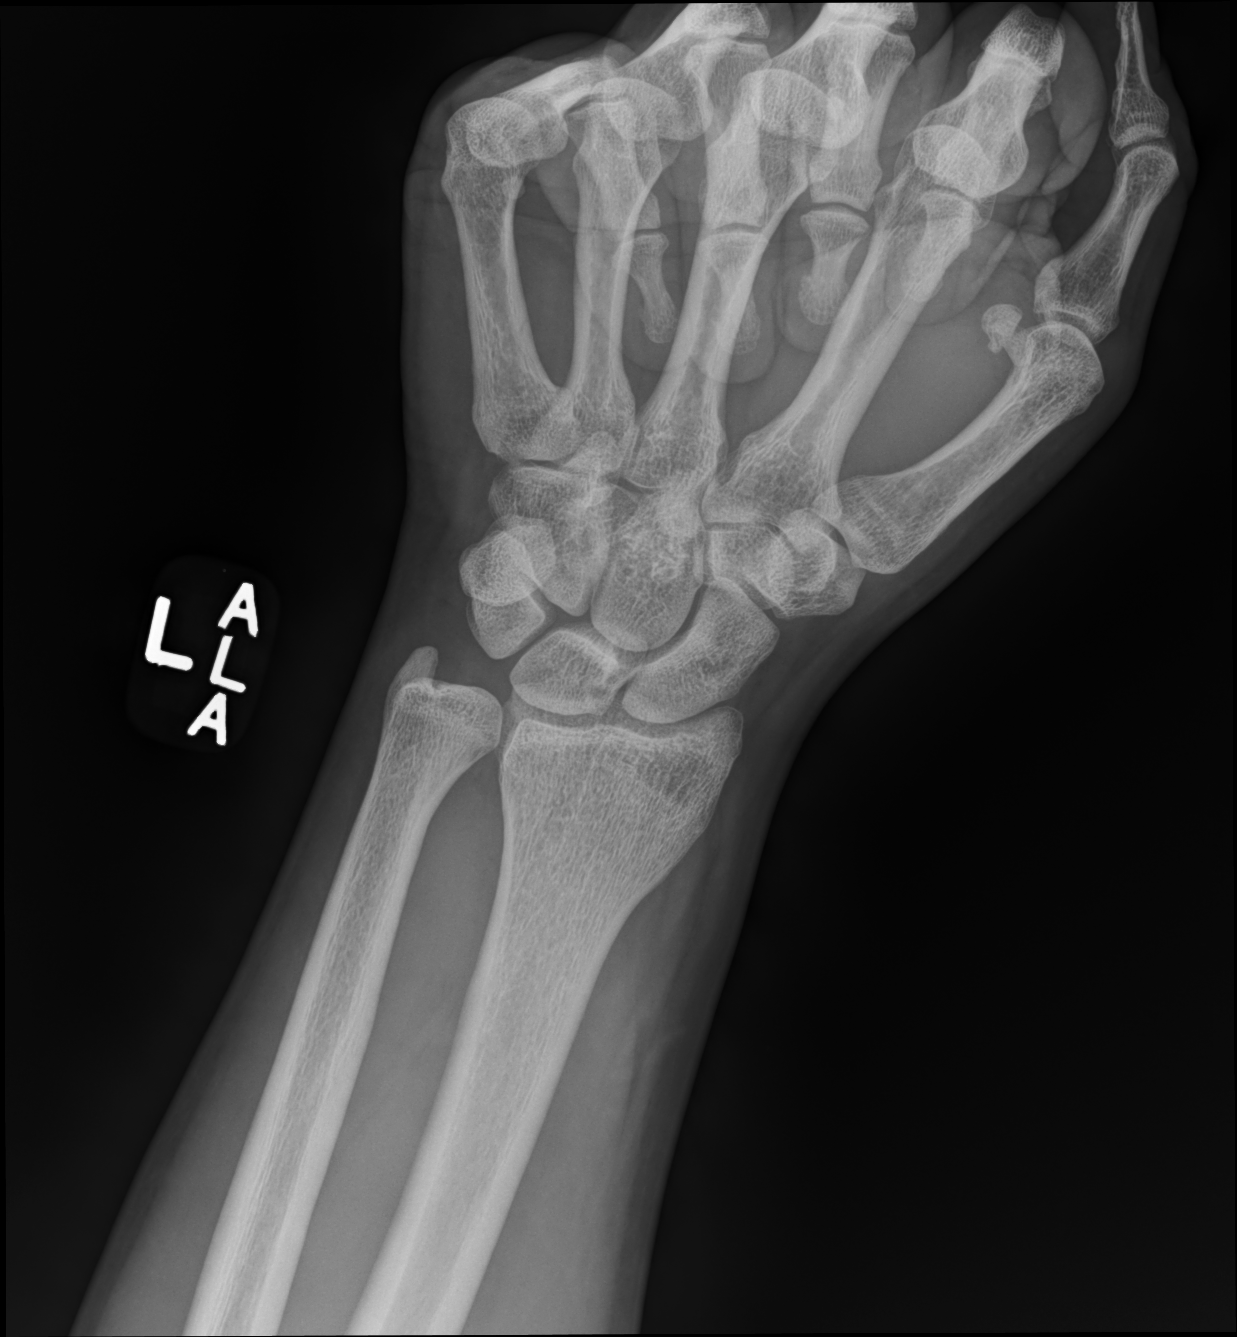

[wrist mlo]
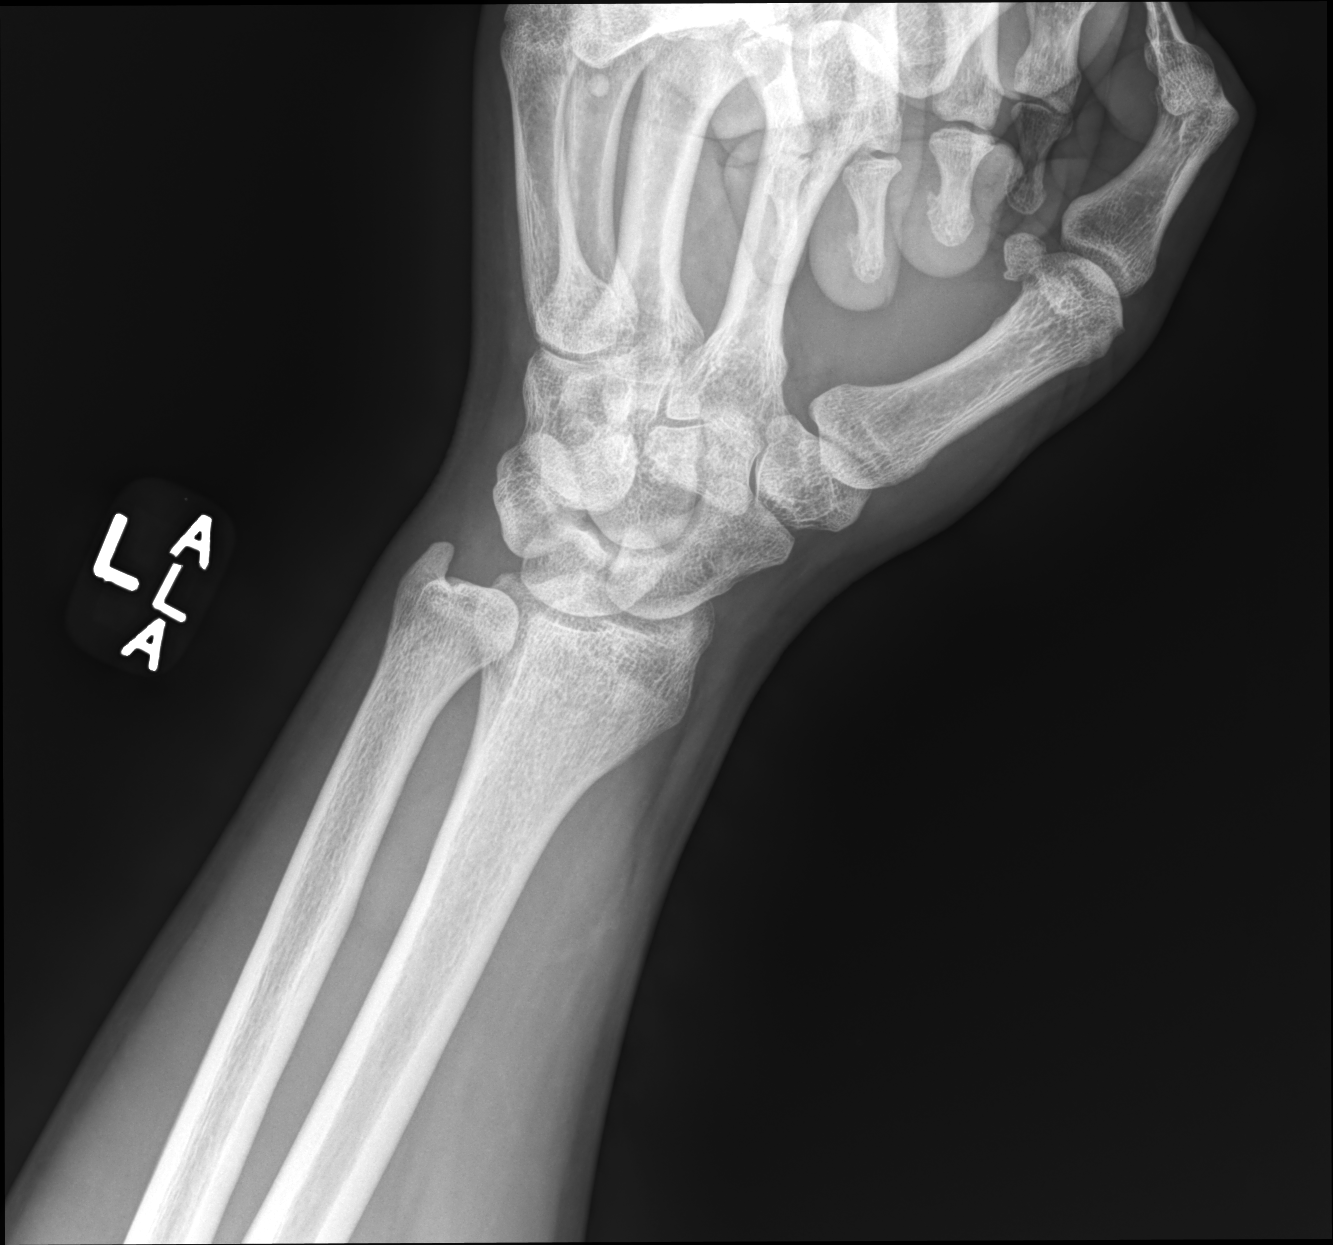

[wrist lat]
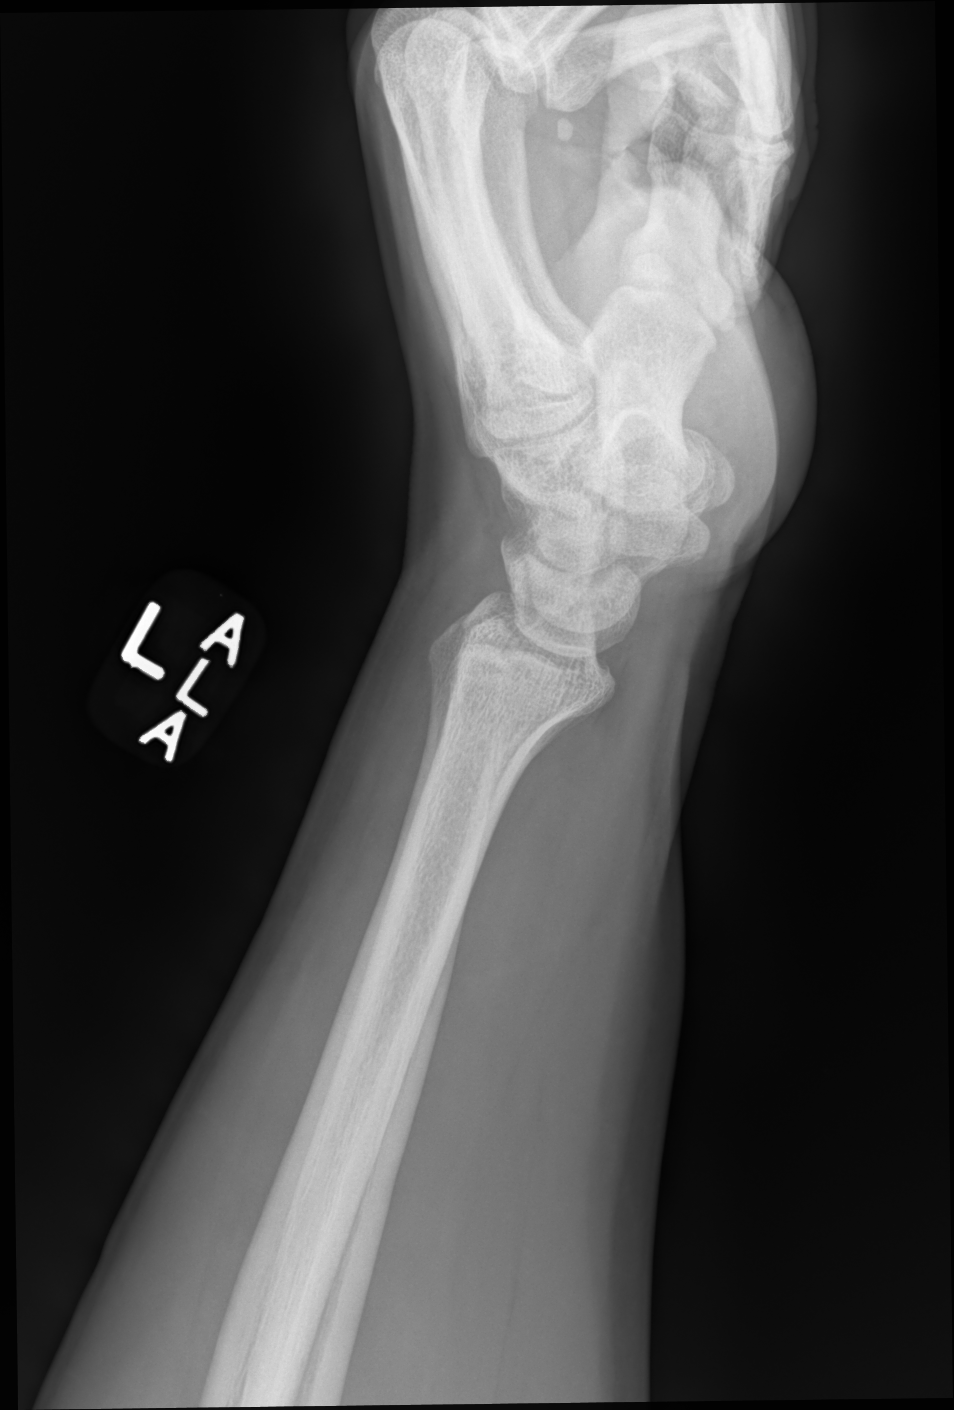

[3 of 3 positions shown; findings below may reference images not displayed]

FINDINGS: Normal bone mineralization. Possible mild degenerative cystic
changes at the lateral aspect of the lunate at the scapholunate
articulation. Joint spaces are preserved. No acute fracture is seen.
No dislocation.
IMPRESSION: Possible mild degenerative cystic changes on the lunate side of the
scapholunate joint.

## 2021-05-03 IMAGING — DX DG WRIST COMPLETE 3+V*R*
3 series · 3 of 3 positions shown · non-contrast
Comparison: None.

CLINICAL DATA: Right wrist pain.

EXAM:
RIGHT WRIST - COMPLETE 3+ VIEW

[wrist pa]
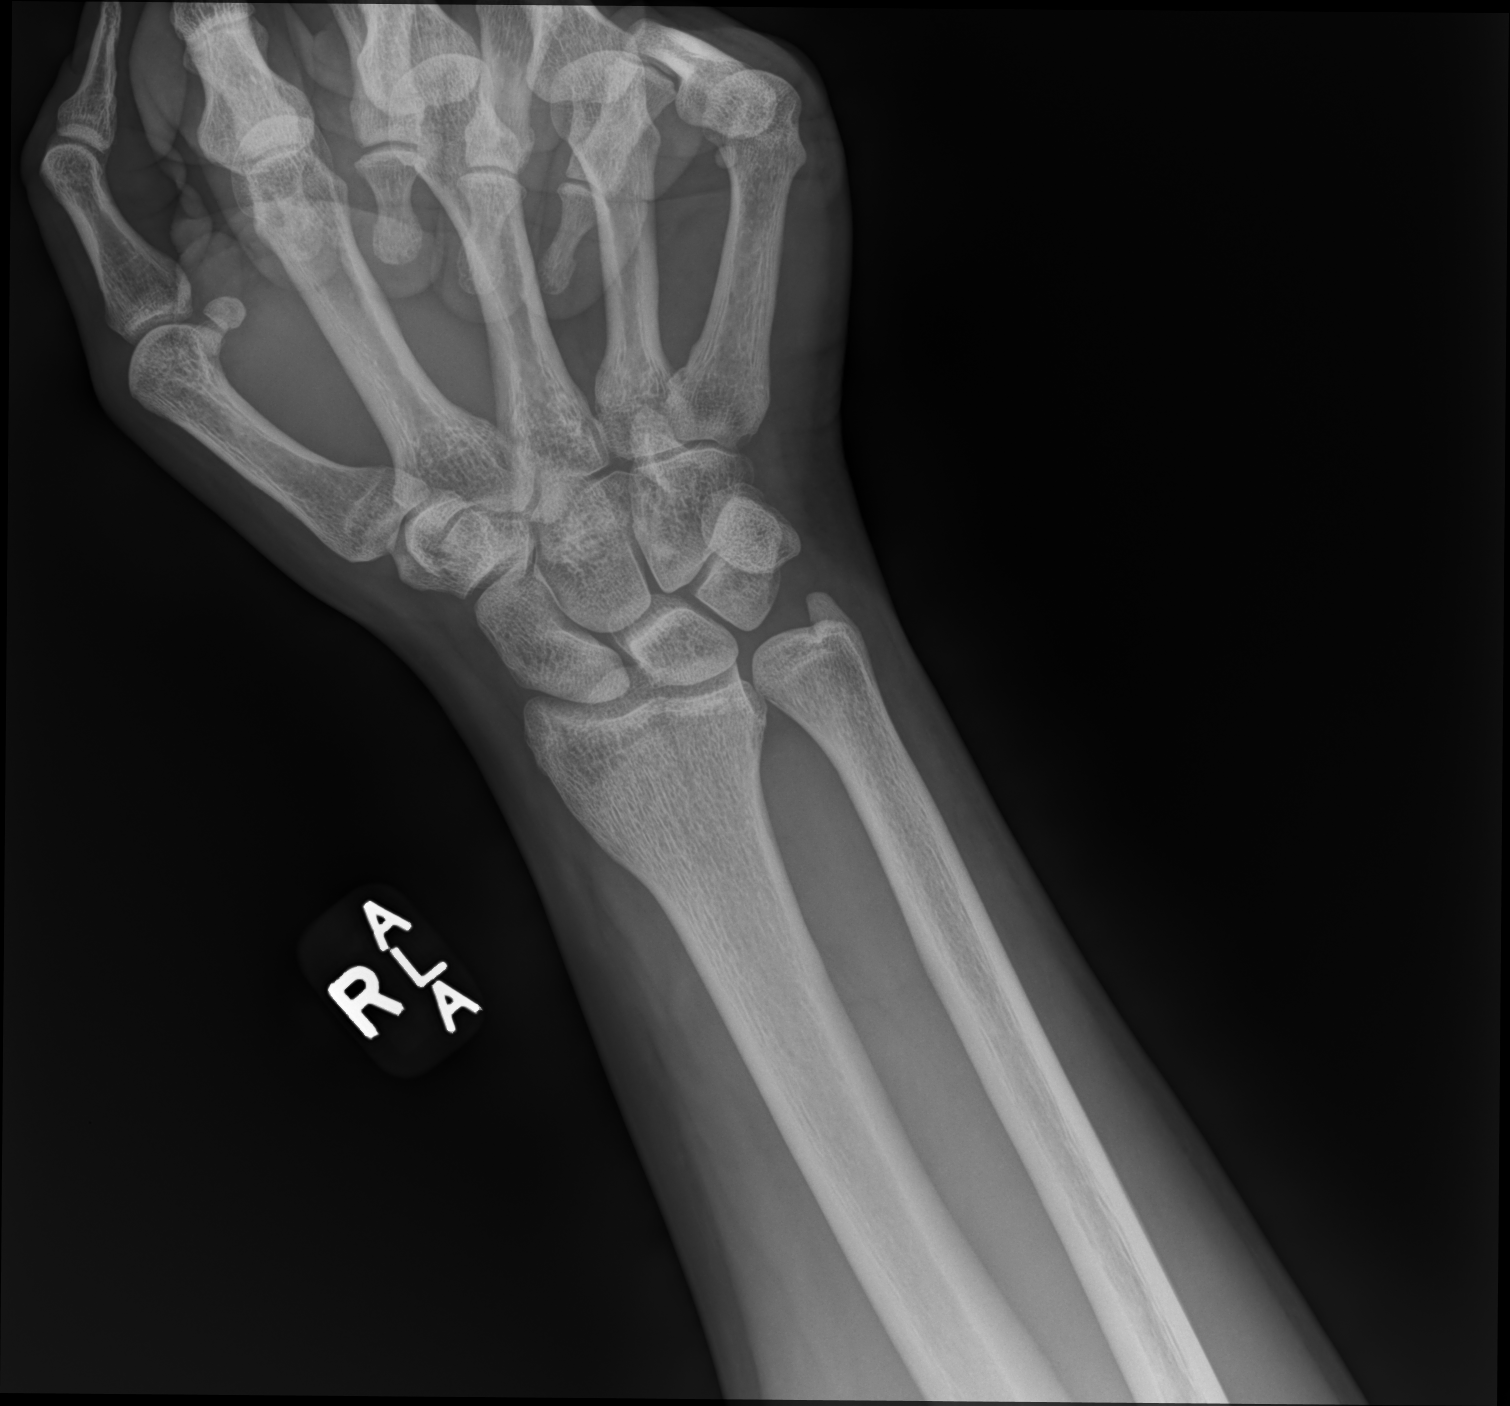

[wrist mlo]
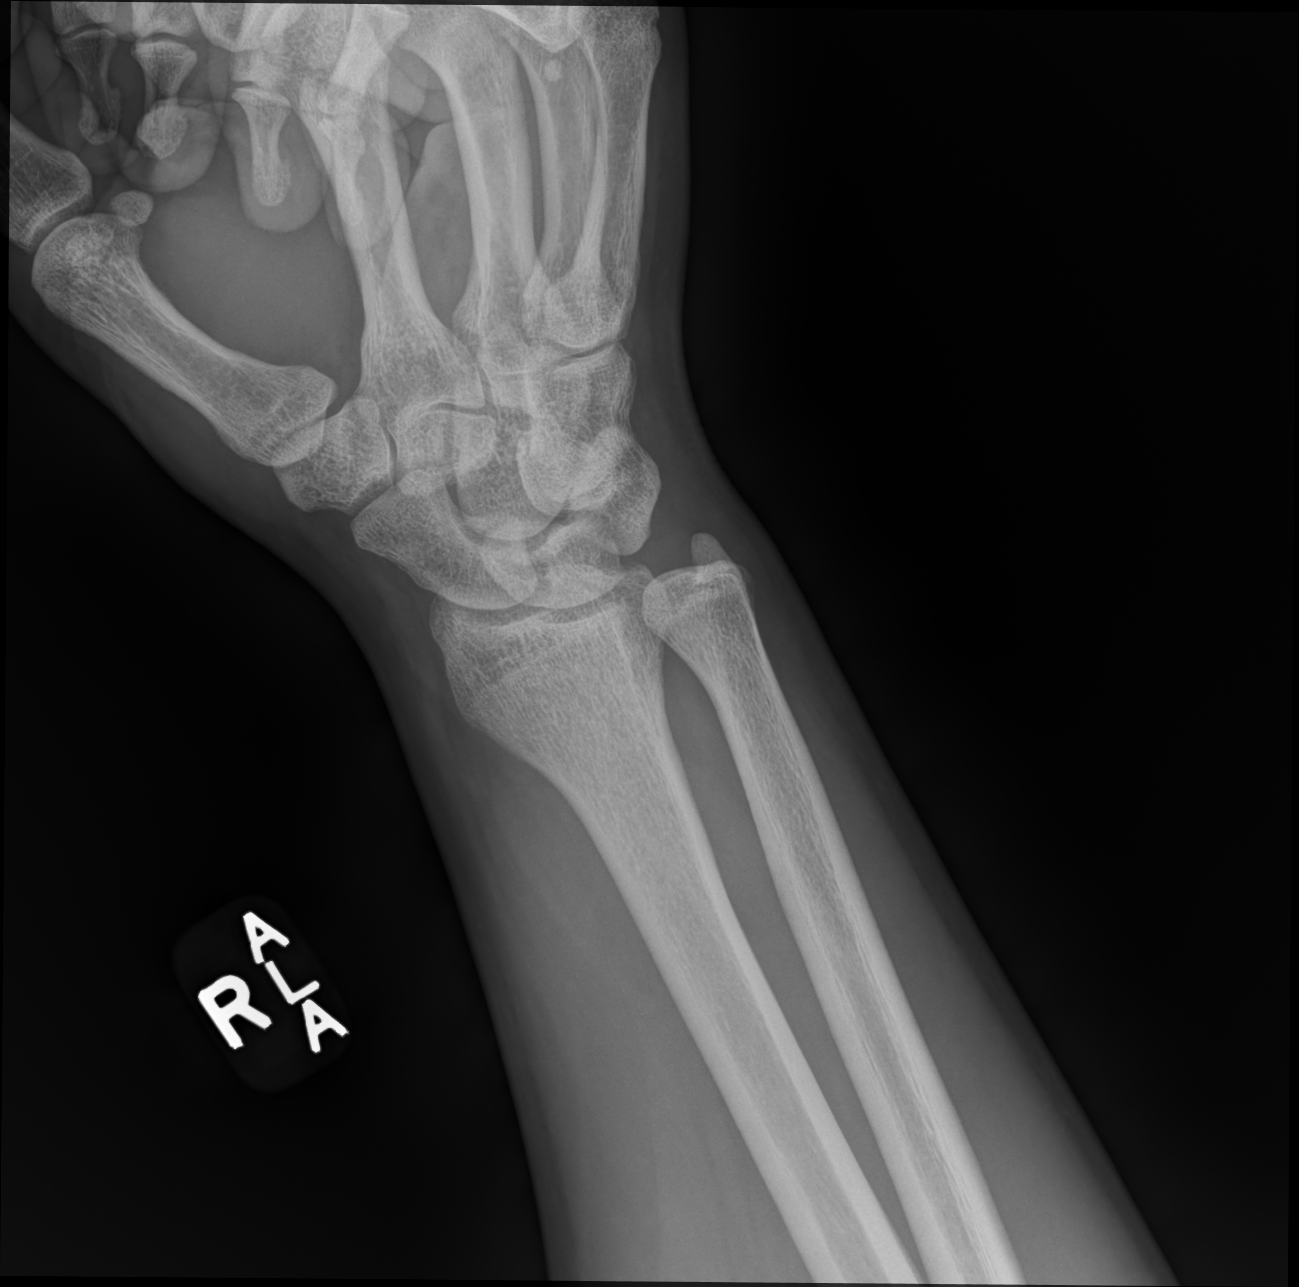

[wrist lat]
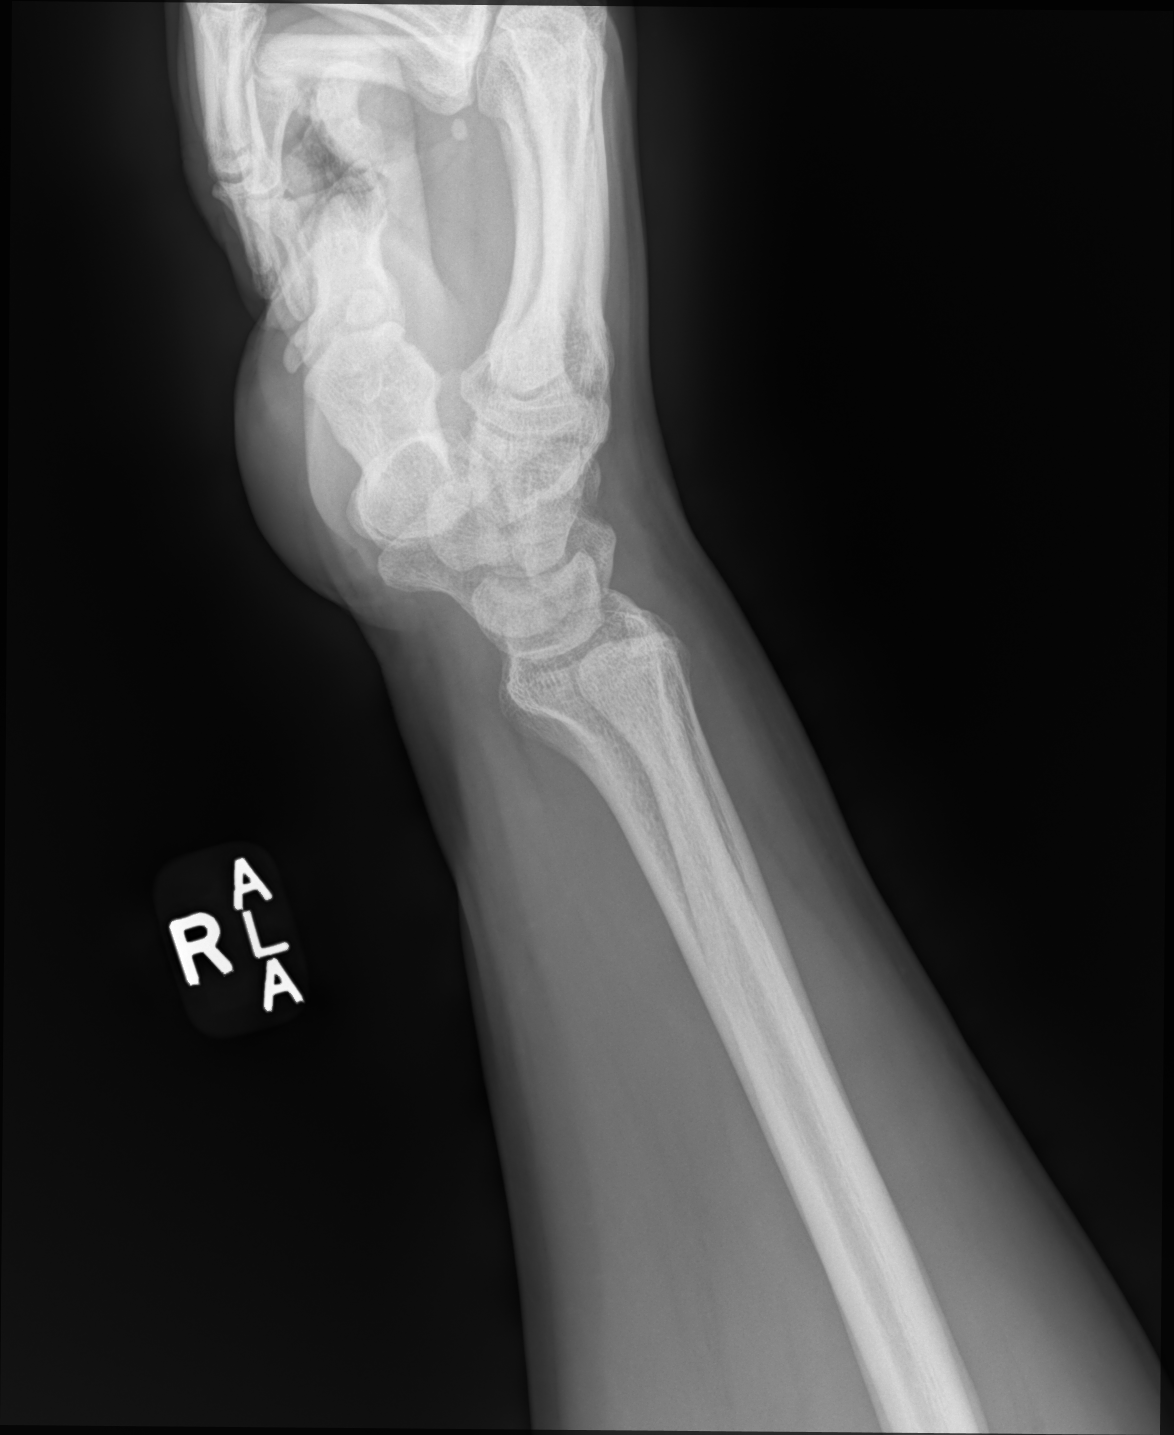

[3 of 3 positions shown; findings below may reference images not displayed]

FINDINGS: Normal bone mineralization. 2 mm ulnar positive variance. Joint
spaces are preserved. No acute fracture is seen. No dislocation.
IMPRESSION: 2 mm ulnar positive variance.  No acute abnormality is seen.

## 2021-05-03 NOTE — Progress Notes (Signed)
Subjective:    Patient ID: Shane Cole., male    DOB: March 18, 1981, 41 y.o.   MRN: [id number]  HPI 41 year old male who  has a past medical history of Allergy, Anxiety, Bipolar disorder ([place]), Bronchitis, Bronchitis, Crush injury lower leg, Depression, Gastric ulcer, GERD (gastroesophageal reflux disease), History of hiatal hernia, Hypertension, Migraine, Nerve pain, and RSD (reflex sympathetic dystrophy).  He presents to the office today for multiple joint pain.  Reports that over the last few weeks he started to experience pain in multiple joints, including both wrists, both knees, and both shoulders.  Pain seems to be worse in his left knee and both wrists.  Does report intermittent redness, warmth, and swelling to the joints.  Pain is described as a stiffness.  Review of Systems See HPI   Past Medical History:  Diagnosis Date   Allergy    Anxiety    Bipolar disorder ([place])    Bronchitis    Bronchitis    Crush injury lower leg    Left lower leg   Depression    Gastric ulcer    GERD (gastroesophageal reflux disease)    will awaken him in night   History of hiatal hernia    Hypertension    Migraine    Nerve pain    RSD (reflex sympathetic dystrophy)     Social History   Socioeconomic History   Marital status: Married    Spouse name: Not on file   Number of children: 1   Years of education: 10th grade   Highest education level: Not on file  Occupational History   Occupation: Disabled    Comment: Previously worked Medical sales representative  Tobacco Use   Smoking status: Every Day    Packs/day: 0.25    Years: 23.00    Pack years: 5.75    Types: Cigarettes   Smokeless tobacco: Never   Tobacco comments:    Using nicotine patch  Vaping Use   Vaping Use: Never used  Substance and Sexual Activity   Alcohol use: Yes    Alcohol/week: 6.0 standard drinks    Types: 6 Cans of beer per week    Comment: occasional   Drug use: No   Sexual activity: Yes     Partners: Female  Other Topics Concern   Not on file  Social History Narrative   05/24/12  Markeese was born and grew up in [place], [place].    He has 2 sisters.    He reports that his childhood was "lousy."    He completed the 10th grade.    He has been married for 5 years, and is currently separated for 3 weeks.    He has a daughter who is 79-1/2 years old.    He has been unemployed for 2 years, and is disabled due to an on-the-job work accident.    He is currently living with his aunt and cousin.    He reports that his hobbies are sports and dogs.    He reports that he is spiritual, but not religious.    He states that his aunt and his father are his social support system.    He denies any current legal problems, but got a DUI in 2007. 05/24/12 [facility]      Social Determinants of Health   Financial Resource Strain: Not on file  Food Insecurity: Not on file  Transportation Needs: Not on file  Physical Activity: Not on file  Stress: Stress Concern Present   Feeling of Stress : To some extent  Social Connections: Unknown   Frequency of Communication with Friends and Family: Once a week   Frequency of Social Gatherings with Friends and Family: Once a week   Attends Religious Services: Not on Electrical engineer or Organizations: No   Attends Archivist Meetings: Not on file   Marital Status: Married  Human resources officer Violence: Not on file    Past Surgical History:  Procedure Laterality Date   CHOLECYSTECTOMY N/A 01/24/2014   Procedure: LAPAROSCOPIC CHOLECYSTECTOMY;  Surgeon: Ralene Ok, MD;  Location: [facility];  Service: General;  Laterality: N/A;   COLONOSCOPY     HAND SURGERY     MASS EXCISION Left 01/25/2013   Procedure: EXCISION MASS DORSAL ASPECT LEFT LONG FINGER DISTAL INTERPHALANGEAL JOINT;  Surgeon: Cammie Sickle., MD;  Location: [facility];  Service: Orthopedics;  Laterality: Left;  Left long    MOUTH SURGERY     TOTAL HIP  ARTHROPLASTY Right 12/03/2020   Procedure: TOTAL HIP ARTHROPLASTY ANTERIOR APPROACH;  Surgeon: Leandrew Koyanagi, MD;  Location: [facility];  Service: Orthopedics;  Laterality: Right;  3-C    Family History  Problem Relation Age of Onset   Hyperlipidemia Father    Hypertension Father    Anxiety disorder Father    Drug abuse Father    Cancer Paternal Grandfather        lung, colon   Colon cancer Paternal Grandfather    Lung cancer Paternal Grandfather    Diabetes Paternal Grandmother    Cancer Paternal Grandmother    Cancer Maternal Grandmother    Cancer Maternal Grandfather    Lung cancer Maternal Grandfather    Stroke Other    Heart disease Other    Depression Paternal Aunt    Anxiety disorder Paternal Aunt    Drug abuse Paternal Uncle    Colon cancer Paternal Uncle    Esophageal cancer Neg Hx    Stomach cancer Neg Hx    Rectal cancer Neg Hx     Allergies  Allergen Reactions   Aspirin Shortness Of Breath and Tinitus    Tinnitus, dizzy, short of breath   Codeine Itching    Reaction to tylenol #3   Penicillins Anaphylaxis and Other (See Comments)    Was told never to take medication.  Has patient had a PCN reaction causing immediate rash, facial/tongue/throat swelling, SOB or lightheadedness with hypotension: no Has patient had a PCN reaction causing severe rash involving mucus membranes or skin necrosis: no Has patient had a PCN reaction that required hospitalization: no Has patient had a PCN reaction occurring within the last 10 years: no If all of the above answers are "NO", then may proceed with Cephalosporin use.    Tramadol     seizure    Amitriptyline Rash    Current Outpatient Medications on File Prior to Visit  Medication Sig Dispense Refill   COVID-19 At Home Antigen Test (CARESTART COVID-19 HOME TEST) KIT Use as directed 4 each 0   diphenhydrAMINE (BENADRYL) 25 MG tablet Take 25 mg by mouth every 6 (six) hours as needed for allergies.     docusate sodium (COLACE)  100 MG capsule Take 1 capsule (100 mg total) by mouth daily as needed. To be taken after surgery 30 capsule 2   famotidine (PEPCID) 20 MG tablet Take 20 mg by mouth daily as needed for heartburn.  gabapentin (NEURONTIN) 300 MG capsule Take 1 capsule (300 mg total) by mouth 3 (three) times daily. 270 capsule 0   ibuprofen (ADVIL) 800 MG tablet Take 1 tablet by mouth three times daily as needed. 90 tablet 6   lidocaine (LIDODERM) 5 % Place 1 patch onto the skin daily. Remove & Discard patch within 12 hours or as directed by MD 30 patch 1   lidocaine (XYLOCAINE) 5 % ointment Apply 1 application topically as needed for moderate pain. 50 g 2   Lidocaine 3 % CREA Apply 1 application topically daily as needed (pain).     Multiple Vitamin (MULTIVITAMIN WITH MINERALS) TABS tablet Take 1 tablet by mouth daily.     Nerve Stimulator (TENS THERAPY PAIN RELIEF) DEVI 1 application by Does not apply route daily as needed (chronic pain).     nicotine (NICODERM CQ - DOSED IN MG/24 HOURS) 21 mg/24hr patch Place onto the skin as directed 28 patch 0   nicotine polacrilex (NICORETTE) 4 MG gum USE AS DIRECTED 110 each 0   nortriptyline (PAMELOR) 75 MG capsule Take 1 capsule (75 mg total) by mouth at bedtime. 90 capsule 0   ondansetron (ZOFRAN ODT) 4 MG disintegrating tablet Dissolve 1 tablet (4 mg total) by mouth every 8 (eight) hours as needed for nausea or vomiting. 20 tablet 2   ondansetron (ZOFRAN) 4 MG tablet Take 1 tablet (4 mg total) by mouth every 8 (eight) hours as needed for nausea or vomiting. To be taken after surgery 40 tablet 0   oxyCODONE (ROXICODONE) 5 MG immediate release tablet Take 1 tablet (5 mg total) by mouth 4 (four) times daily for 14 days. 56 tablet 0   pantoprazole (PROTONIX) 40 MG tablet Take 1 tablet twice daily 30 to 60 minutes before breakfast and dinner. (Patient taking differently: Take 40 mg by mouth in the morning.) 60 tablet 5   sildenafil (REVATIO) 20 MG tablet TAKE 1 TABLET BY MOUTH  2 TIMES DAILY 180 tablet 1   tiZANidine (ZANAFLEX) 4 MG tablet Take 1 tablet (4 mg total) by mouth 2 (two) times daily. 180 tablet 0   No current facility-administered medications on file prior to visit.    BP 128/60    Pulse 94    Temp 99 F (37.2 C) (Oral)    Ht 5' 11" (1.803 m)    Wt 220 lb (99.8 kg)    SpO2 98%    BMI 30.68 kg/m       Objective:   Physical Exam Vitals and nursing note reviewed.  Constitutional:      Appearance: Normal appearance.  Musculoskeletal:        General: Tenderness present.     Comments: Does have tenderness throughout both knees and wrists.  Has some mild swelling to the dorsal aspect of the right wrist with some localized erythema.  Swelling noted to the knees.  Seems to have good range of motion to all joints.  Skin:    General: Skin is warm and dry.     Capillary Refill: Capillary refill takes less than 2 seconds.  Neurological:     General: No focal deficit present.     Mental Status: He is alert and oriented to person, place, and time.  Psychiatric:        Mood and Affect: Mood normal.        Behavior: Behavior normal.        Thought Content: Thought content normal.  Judgment: Judgment normal.      Assessment & Plan:  1. Polyarthritis -Need to rule out rheumatoid arthritis.  Possible gout?  Check labs and do x-rays of the more painful joints.  Consider referral to rheumatology. - DG Knee 1-2 Views Left; Future - DG Wrist Complete Right; Future - DG Wrist Complete Left; Future - ANA; Future - Uric Acid; Future - Sedimentation Rate; Future - C-reactive Protein; Future - Rheumatoid Factor; Future - Rheumatoid Factor - C-reactive Protein - Sedimentation Rate - Uric Acid - ANA  Dorothyann Peng, NP

## 2021-05-05 LAB — URIC ACID: Uric Acid, Serum: 6.6 mg/dL (ref 4.0–8.0)

## 2021-05-05 LAB — SEDIMENTATION RATE: Sed Rate: 9 mm/h (ref 0–15)

## 2021-05-05 LAB — ANA: Anti Nuclear Antibody (ANA): NEGATIVE

## 2021-05-05 LAB — RHEUMATOID FACTOR: Rheumatoid fact SerPl-aCnc: <14 IU/mL (ref <14)

## 2021-05-05 LAB — C-REACTIVE PROTEIN: CRP: 7.4 mg/L (ref <8.0)

## 2021-05-07 ENCOUNTER — Encounter: Payer: Self-pay | Admitting: Adult Health

## 2021-05-07 ENCOUNTER — Telehealth: Payer: Self-pay | Admitting: Adult Health

## 2021-05-07 DIAGNOSIS — M130 Polyarthritis, unspecified: Secondary | ICD-10-CM

## 2021-05-07 NOTE — Telephone Encounter (Signed)
Updated patient on labs and xrays   Xray of right wrist shows a 45mm ulnar positive variance   Xray of left wrist shows mild degenerative cystic changes

## 2021-05-07 NOTE — Telephone Encounter (Signed)
FYI

## 2021-05-07 NOTE — Progress Notes (Signed)
PROVIDER NOTE: Information contained herein reflects review and annotations entered in association with encounter. Interpretation of such information and data should be left to medically-trained personnel. Information provided to patient can be located elsewhere in the medical record under "Patient Instructions". Document created using STT-dictation technology, any transcriptional errors that may result from process are unintentional.    Patient: Shane Cole.  Service Category: E/M  Provider: Gaspar Cola, MD  DOB: 10-19-1980  DOS: 05/08/2021  Specialty: Interventional Pain Management  MRN: [id number]  Setting: Ambulatory outpatient  PCP: Dorothyann Peng, NP  Type: Established Patient    Referring Provider: Dorothyann Peng, NP  Location: Office  Delivery: Face-to-face     Primary Reason(s) for Visit: Encounter for evaluation before starting new chronic pain management plan of care (Level of risk: moderate) CC: Back Pain, Hip Pain (Bilateral. Right replacement 11/2020.), Wrist Pain (Bilateral. Hursts holding things), and Shoulder Pain (right)  HPI  Shane Cole is a 41 y.o. year old, male patient, who comes today for a follow-up evaluation to review the test results and decide on a treatment plan. He has Chronic diarrhea; Bipolar disorder ([place]); Smoker; Chronic narcotic use; Opioid contract exists; Thoracic outlet syndrome; Adhesive capsulitis of right shoulder; Essential hypertension; GERD (gastroesophageal reflux disease); Internal hemorrhoid; Abdominal pain, chronic, epigastric; Rectal bleeding; Auricular cyst; Coccygeal pain, acute; Laryngopharyngeal reflux (LPR); Chronic pain syndrome; Chronic foot pain (1ry area of Pain) (Left); Pharmacologic therapy; Disorder of skeletal system; Problems influencing health status; Avascular necrosis of hip (Right) ([place]); Chronic hip pain (Right); Chronic low back pain (2ry area of Pain) (Bilateral) w/o sciatica; Chronic shoulder pain (3ry area of Pain)  (Right); History of total hip replacement (Right); Raynaud phenomenon; and Family history of avascular necrosis of hip (Right) on their problem list. His primarily concern today is the Back Pain, Hip Pain (Bilateral. Right replacement 11/2020.), Wrist Pain (Bilateral. Hursts holding things), and Shoulder Pain (right)  Pain Assessment: Location: Right, Left, Lower Back Radiating: buttocks bilateral down back of leg to top of heel. Onset: More than a month ago Duration: Chronic pain Quality: Aching, Stabbing, Nagging, Discomfort Severity: 4 /10 (subjective, self-reported pain score)  Effect on ADL: sleep, sitting, prolonged walking, standing, had to go to working part time Timing: Constant Modifying factors:   BP: (!) 159/87   HR: (!) 591  Shane Cole comes in today for a follow-up visit after his initial evaluation on Visit date not found. Today we went over the results of his tests. These were explained in "Layman's terms". During today's appointment we went over my diagnostic impression, as well as the proposed treatment plan.  Review of initial evaluation (07/30/2020): "According to the patient the problem started approximately 15 years ago around 2005 when he had a marked top made out of [place] fall on his left foot causing a crush injury that later developed into a complex regional pain syndrome.  Over the years he has been treated by different physicians such as Dr. Wylene Men, Dr. Berle Mull, and Forde Radon. [place], MD.   According to the patient the primary area of pain is that of the left foot.  He denies having had any surgeries in the foot but he did have treatment with a TENS unit, as well as lumbar sympathetic blocks.  The patient secondary area pain is that of the lower back (Bilateral) with the right being just as bad as the left.  He indicates that the pain in the lower back worsens with weight lifting, bending,  and he has had some epidural steroid injections done by Dr.  Trula Ore.  He denies any back surgeries or recent x-rays.  He also denies any physical therapy for the back pain.  The patient's third area pain is that of the right shoulder.  He denies any prior surgeries, physical therapy, recent x-rays, but does admit to having had some injections done by his primary care physician which apparently did provide him with some relief of the pain.  More recently he was diagnosed with a right hip avascular necrosis and he is pending to see Dr. Sherrian Divers."  Today I went over the results of his lab work and x-rays, all of which were explained to the patient in layman's terms.  He indicates having access to "MY CHART" system and therefore has also seen the results.  I asked him if he had any questions and he indicated that he did not.  He did have his right hip replacement and he refers doing much better.  In terms of his foot, the three-phase bone scan would suggest a problem with none healing of the foot fractures rather than a CRPS.  He confirms that when he walks he still has pain that feels as if it was fractured.  A lot of problems that have seen on him seem to stem from long-term complications from smoking.  He refers that he has quit smoking, but apparently has moved down to using a nicotine patch.  I have explained to the patient that although stopping the smoking habit is good in terms of better oxygenation, it is actually the nicotine that causes a damage to the inner lining of blood vessels.  He does seem to have a Raynaud's disease.  Today the patient came in apparently for simple follow-up to get the results of the lab work and the x-rays.  Today he also indicated that he has been having some problems with the left hip and they have suggested that he may need a hip replacement on that side.  He also states that he has been going back to work and since he did that he has been experiencing this redness and swelling of both of his wrists.  Today I asked him if he needed  anything else for me and he indicated that he did not.   Controlled Substance Pharmacotherapy Assessment REMS (Risk Evaluation and Mitigation Strategy)  Opioid Analgesic:  No chronic opioid analgesics therapy prescribed by our practice. Oxycodone IR 5 mg, 1 tab p.o. 4 times daily (#[id number]) (04/27/2021) (written by Apolinar Junes, NP) (PCP) MME/day: 30 mg/day  Pill Count: None expected due to no prior prescriptions written by our practice. Ignatius Specking, RN  05/08/2021  2:01 PM  Sign when Signing Visit Safety precautions to be maintained throughout the outpatient stay will include: orient to surroundings, keep bed in low position, maintain call bell within reach at all times, provide assistance with transfer out of bed and ambulation.  Patient not been here since May 2022 r/t right Hip replacement.   Pharmacokinetics: Liberation and absorption (onset of action): WNL Distribution (time to peak effect): WNL Metabolism and excretion (duration of action): WNL         Pharmacodynamics: Desired effects: Analgesia: Shane Cole reports >24% benefit. Functional ability: Patient reports that medication allows him to accomplish basic ADLs Clinically meaningful improvement in function (CMIF): Sustained CMIF goals met Perceived effectiveness: Described as relatively effective, allowing for increase in activities of daily living (ADL) Undesirable effects: Side-effects or Adverse  reactions: None reported Monitoring: [place] PMP: PDMP reviewed during this encounter. Online review of the past 71-monthperiod previously conducted. Not applicable at this point since we have not taken over the patient's medication management yet. List of other Serum/Urine Drug Screening Test(s):  Lab Results  Component Value Date   COCAINSCRNUR NONE DETECTED 11/26/2016   COCAINSCRNUR NEG 01/08/2015   COCAINSCRNUR NEG 10/25/2014   COCAINSCRNUR NEG 09/07/2014   COCAINSCRNUR NEG 05/26/2014   THCU NONE DETECTED 11/26/2016    THCU NEG 01/08/2015   THCU NEG 10/25/2014   THCU NEG 09/07/2014   THCU NEG 05/26/2014   List of all UDS test(s) done:  Lab Results  Component Value Date   SUMMARY Note 07/30/2020   Last UDS on record: Summary  Date Value Ref Range Status  07/30/2020 Note  Final    Comment:    ==================================================================== Compliance Drug Analysis, Ur ==================================================================== Test                             Result       Flag       Units  Drug Present not Declared for Prescription Verification   Noroxycodone                   48           UNEXPECTED ng/mg creat    Noroxycodone is an expected metabolite of oxycodone. Sources of    oxycodone include scheduled prescription medications.    Ephedrine/Pseudoephedrine      PRESENT      UNEXPECTED   Phenylpropanolamine            PRESENT      UNEXPECTED    Source of ephedrine/pseudoephedrine is most commonly pseudoephedrine    in over-the-counter or prescription cold and allergy medications.    Phenylpropanolamine is an expected metabolite of    ephedrine/pseudoephedrine.    Acetaminophen                  PRESENT      UNEXPECTED  Drug Absent but Declared for Prescription Verification   Gabapentin                     Not Detected UNEXPECTED   Tizanidine                     Not Detected UNEXPECTED    Tizanidine, as indicated in the declared medication list, is not    always detected even when used as directed.    Nortriptyline                  Not Detected UNEXPECTED   Lidocaine                      Not Detected UNEXPECTED    Lidocaine, as indicated in the declared medication list, is not    always detected even when used as directed.  ==================================================================== Test                      Result    Flag   Units      Ref Range   Creatinine              113              mg/dL       >=20 ==================================================================== Declared Medications:  The flagging  and interpretation on this report are based on the  following declared medications.  Unexpected results may arise from  inaccuracies in the declared medications.   **Note: The testing scope of this panel includes these medications:   Gabapentin  Nortriptyline   **Note: The testing scope of this panel does not include small to  moderate amounts of these reported medications:   Lidocaine  Tizanidine   **Note: The testing scope of this panel does not include the  following reported medications:   Famotidine  Multivitamin  Ondansetron  Pantoprazole  Sildenafil ==================================================================== For clinical consultation, please call [phone]. ====================================================================    UDS interpretation: No unexpected findings.          Medication Assessment Form: Not applicable. No opioids. Treatment compliance: Not applicable Risk Assessment Profile: Aberrant behavior: See initial evaluations. None observed or detected today Comorbid factors increasing risk of overdose: See initial evaluation. No additional risks detected today Opioid risk tool (ORT):  Opioid Risk  07/30/2020  Alcohol 0  Illegal Drugs 0  Rx Drugs 0  Alcohol 0  Illegal Drugs 0  Rx Drugs 0  Age between 16-45 years  0  History of Preadolescent Sexual Abuse 0  Depression 1  Opioid Risk Tool Scoring 1  Opioid Risk Interpretation Low Risk    ORT Scoring interpretation table:  Score <3 = Low Risk for SUD  Score between 4-7 = Moderate Risk for SUD  Score >8 = High Risk for Opioid Abuse   Risk of substance use disorder (SUD): Low  Risk Mitigation Strategies:  Patient opioid safety counseling: No controlled substances prescribed. Patient-Prescriber Agreement (PPA): No agreement signed.  Controlled substance notification to  other providers: None required. No opioid therapy.  Pharmacologic Plan:  None              Laboratory Chemistry Profile   Renal Lab Results  Component Value Date   BUN 11 12/07/2020   CREATININE 1.03 12/07/2020   LABCREA 128.21 01/08/2015   BCR 10 07/30/2020   GFR 107.30 07/03/2020   GFRAA >60 07/01/2017   GFRNONAA >60 12/07/2020   SPECGRAV >=1.030 04/02/2016   PHUR 6.0 04/02/2016   PROTEINUR NEGATIVE 11/21/2020     Electrolytes Lab Results  Component Value Date   NA 131 (L) 12/07/2020   K 3.6 12/07/2020   CL 95 (L) 12/07/2020   CALCIUM 8.7 (L) 12/07/2020   MG 1.8 07/30/2020     Hepatic Lab Results  Component Value Date   AST 28 11/21/2020   ALT 40 11/21/2020   ALBUMIN 4.6 11/21/2020   ALKPHOS 82 11/21/2020   AMYLASE 34 08/24/2018   LIPASE 16.0 08/24/2018     ID Lab Results  Component Value Date   HIV NON-REACTIVE 03/29/2020   STAPHAUREUS NEGATIVE 11/21/2020   MRSAPCR NEGATIVE 11/21/2020   RMSFIGG NOT DETECTED 03/29/2020     Bone Lab Results  Component Value Date   VD25OH 18.46 (L) 12/18/2017   25OHVITD1 28 (L) 07/30/2020   25OHVITD2 1.1 07/30/2020   25OHVITD3 27 07/30/2020     Endocrine Lab Results  Component Value Date   GLUCOSE 138 (H) 12/07/2020   GLUCOSEU NEGATIVE 11/21/2020   HGBA1C 5.8 10/25/2014   TSH 3.30 03/29/2020   FREET4 1.06 12/05/2014     Neuropathy Lab Results  Component Value Date   VITAMINB12 1,282 (H) 07/30/2020   HGBA1C 5.8 10/25/2014   HIV NON-REACTIVE 03/29/2020     CNS No results found for: COLORCSF, APPEARCSF, RBCCOUNTCSF, WBCCSF, POLYSCSF, LYMPHSCSF, EOSCSF, PROTEINCSF,  GLUCCSF, JCVIRUS, CSFOLI, IGGCSF, LABACHR, ACETBL, LABACHR, ACETBL   Inflammation (CRP: Acute   ESR: Chronic) Lab Results  Component Value Date   CRP 7.4 05/03/2021   ESRSEDRATE 9 05/03/2021   LATICACIDVEN 1.3 12/07/2020     Rheumatology Lab Results  Component Value Date   RF <14 05/03/2021   ANA NEGATIVE 05/03/2021   LABURIC 6.6  05/03/2021     Coagulation Lab Results  Component Value Date   INR 1.0 11/21/2020   LABPROT 13.0 11/21/2020   APTT 27 11/21/2020   PLT 226 12/07/2020   DDIMER <0.27 02/08/2013     Cardiovascular Lab Results  Component Value Date   HGB 12.4 (L) 12/07/2020   HCT 37.5 (L) 12/07/2020     Screening Lab Results  Component Value Date   STAPHAUREUS NEGATIVE 11/21/2020   MRSAPCR NEGATIVE 11/21/2020   HIV NON-REACTIVE 03/29/2020     Cancer No results found for: CEA, CA125, LABCA2   Allergens No results found for: ALMOND, APPLE, ASPARAGUS, AVOCADO, BANANA, BARLEY, BASIL, BAYLEAF, GREENBEAN, LIMABEAN, WHITEBEAN, BEEFIGE, REDBEET, BLUEBERRY, BROCCOLI, CABBAGE, MELON, CARROT, CASEIN, CASHEWNUT, CAULIFLOWER, CELERY     Note: Lab results reviewed.  Recent Diagnostic Imaging Review  Shoulder Imaging: Shoulder-R DG: Results for orders placed during the hospital encounter of 07/30/20 DG Shoulder Right  Narrative CLINICAL DATA:  Right shoulder pain  EXAM: RIGHT SHOULDER - 2+ VIEW  COMPARISON:  None.  FINDINGS: There is no evidence of fracture or dislocation. There is no evidence of arthropathy or other focal bone abnormality. Soft tissues are unremarkable.  IMPRESSION: No acute osseous injury of the right shoulder.   Electronically Signed By: Kathreen Devoid On: 08/01/2020 10:28  Shoulder-L DG: Results for orders placed during the hospital encounter of 01/04/11 DG Shoulder Left  Narrative *RADIOLOGY REPORT*  Clinical Data: Pain  LEFT SHOULDER - 2+ VIEW  Comparison: None  Findings: There is no acute fracture or subluxation identified.  There is no significant arthropathy.  Sclerotic density within the inferior glenoid, likely bone island.  IMPRESSION:  1.  Negative exam.  Original Report Authenticated By: Angelita Ingles, M.D.  Lumbosacral Imaging: Lumbar DG (Complete) 4+V: Results for orders placed during the hospital encounter of 05/20/12 DG Lumbar  Spine Complete  Narrative *RADIOLOGY REPORT*  Clinical Data: Fall with low back pain.  LUMBAR SPINE - COMPLETE 4+ VIEW  Comparison: None  Findings: Five non-rib bearing lumbar type vertebra are identified with a very mild mild apex left lumbar scoliosis. There is no evidence of fracture or subluxation. The disc spaces are maintained. No focal bony lesions or spondylolysis identified.  IMPRESSION: Very mild scoliosis without other significant abnormality.   Original Report Authenticated By: Margarette Canada, M.D.  Lumbar DG Bending views: Results for orders placed during the hospital encounter of 07/30/20 DG Lumbar Spine Complete W/Bend  Narrative CLINICAL DATA:  Low back pain  EXAM: LUMBAR SPINE - COMPLETE WITH BENDING VIEWS  COMPARISON:  None.  FINDINGS: 5 nonrib bearing lumbar-type vertebral bodies.  Chronic mild T12 anterior vertebral body height loss. No acute fracture.  No static or dynamic listhesis.  No spondylolysis.  Degenerative disease with disc height loss at L5-S1.  SI joints are unremarkable.  IMPRESSION: Mild degenerative disease with disc height loss at L5-S1.   Electronically Signed By: Kathreen Devoid On: 08/01/2020 10:27  Hip Imaging: Hip-R MR wo contrast: Results for orders placed during the hospital encounter of 08/25/20 MR Hip Right w/o contrast  Narrative CLINICAL DATA:  Chronic right hip pain.  Hip avascular necrosis.  EXAM: MR OF THE RIGHT HIP WITHOUT CONTRAST  TECHNIQUE: Multiplanar, multisequence MR imaging was performed. No intravenous contrast was administered.  COMPARISON:  07/30/2020 radiographs and CT pelvis from 07/24/2020  FINDINGS: Bones: Small regions of chronic bilateral avascular necrosis of the femoral heads, larger on the right than the left. No flattening or surface morphology changes in the femoral heads. No surrounding marrow edema to indicate active ischemic process. Mild type 1 degenerative endplate  findings at Q3-R0 with associated disc desiccation.  Articular cartilage and labrum  Articular cartilage:  Unremarkable  Labrum:  Grossly unremarkable  Joint or bursal effusion  Joint effusion:  Absent  Bursae: No regional bursitis  Muscles and tendons  Muscles and tendons:  Unremarkable  Other findings  Miscellaneous:   No supplemental non-categorized findings.  IMPRESSION: 1. Small regions of chronic bilateral avascular necrosis in both femoral heads, without marrow edema to suggest active process. 2. Degenerative disc disease and mild degenerative endplate findings at Q7-M2.   Electronically Signed By: Van Clines M.D. On: 08/26/2020 11:04  Hip-R DG 2-3 views: Results for orders placed during the hospital encounter of 12/07/20 DG Hip Unilat W or Wo Pelvis 2-3 Views Right  Narrative CLINICAL DATA:  Status post total right hip arthroplasty 12/03/2020, pain  EXAM: DG HIP (WITH OR WITHOUT PELVIS) 2-3V RIGHT  COMPARISON:  Intraoperative radiographs 12/03/2020  FINDINGS: Postsurgical changes reflecting right hip arthroplasty are again seen. Hardware alignment is within expected limits. There is no evidence of hardware related complication. Left hip alignment is stable. The SI joints and symphysis pubis are intact.  There are scattered locules of subcutaneous gas likely related to recent operation.  IMPRESSION: Postsurgical changes reflecting right hip arthroplasty without evidence of complication.   Electronically Signed By: Valetta Mole M.D. On: 12/07/2020 09:55  Ankle Imaging: Ankle-R DG Complete: Results for orders placed in visit on 12/05/14 DG Ankle Complete Right  Narrative CLINICAL DATA:  Twisting injury with swelling and pain. Initial encounter.  EXAM: RIGHT ANKLE - COMPLETE 3+ VIEW  COMPARISON:  Foot films, dictated separately.  FINDINGS: First image is suboptimal secondary to patient positioning. Mortise and lateral views  demonstrate no fracture or dislocation. Tiny Achilles and calcaneal spurs. Base of fifth metatarsal and talar dome intact. No definite soft tissue swelling.  IMPRESSION: No acute osseous abnormality.   Electronically Signed By: Abigail Miyamoto M.D. On: 12/05/2014 20:31  Ankle-L DG Complete: Results for orders placed during the hospital encounter of 01/29/08 DG Ankle Complete Left  Narrative Clinical Data: Trauma with swelling anteriorly and medially  LEFT ANKLE COMPLETE - 3+ VIEW  Comparison: None  Findings: No evidence of fracture, dislocation or joint effusion. Anterior soft tissue swelling.]  IMPRESSION: No fracture or dislocation  Provider: Dyke Brackett  Foot Imaging: Foot-R DG Complete: Results for orders placed in visit on 12/05/14 DG Foot Complete Right  Narrative CLINICAL DATA:  Twisting injury with pain. Initial encounter.  EXAM: RIGHT FOOT COMPLETE - 3+ VIEW  COMPARISON:  Ankle films, dictated separately.  FINDINGS: No acute fracture or dislocation.  No definite soft tissue swelling.  IMPRESSION: No acute osseous abnormality.   Electronically Signed By: Abigail Miyamoto M.D. On: 12/05/2014 20:32  Foot-L DG Complete: Results for orders placed during the hospital encounter of 07/30/20 DG Foot Complete Left  Narrative CLINICAL DATA:  Left foot pain since a crush injury in 2005.  EXAM: LEFT FOOT - COMPLETE 3+ VIEW  COMPARISON:  Plain films left foot 06/16/2013.  FINDINGS: Again seen is nonunion of a fracture at the lateral corner of the proximal phalanx of the second toe with secondary osteoarthritis at the second MTP joint. Mild osteoarthritis at the third MTP joint is also again seen. No acute bony or joint abnormality. Soft tissues are negative.  IMPRESSION: No change compared to the prior examination.  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   Electronically Signed By: Inge Rise M.D. On: 08/01/2020 10:20  Wrist Imaging: Wrist-R DG Complete: Results for orders placed in visit on 05/03/21 DG Wrist Complete Right  Narrative CLINICAL DATA:  Right wrist pain.  EXAM: RIGHT WRIST - COMPLETE 3+ VIEW  COMPARISON:  None.  FINDINGS: Normal bone mineralization. 2 mm ulnar positive variance. Joint spaces are preserved. No acute fracture is seen. No dislocation.  IMPRESSION: 2 mm ulnar positive variance.  No acute abnormality is seen.   Electronically Signed By: Yvonne Kendall M.D. On: 05/06/2021 09:57  Wrist-L DG Complete: Results for orders placed in visit on 05/03/21 DG Wrist Complete Left  Narrative CLINICAL DATA:  Left wrist pain and swelling.  EXAM: LEFT WRIST - COMPLETE 3+ VIEW  COMPARISON:  Left hand radiographs 08/12/2018  FINDINGS: Normal bone mineralization. Possible mild degenerative cystic changes at the lateral aspect of the lunate at the scapholunate articulation. Joint spaces are preserved. No acute fracture is seen. No dislocation.  IMPRESSION: Possible mild degenerative cystic changes on the lunate side of the scapholunate joint.   Electronically Signed By: Yvonne Kendall M.D. On: 05/06/2021 09:53  Hand Imaging: Hand-L DG Complete: Results for orders placed in visit on 08/12/18 DG Hand Complete Left  Narrative CLINICAL DATA:  Status post fall hit left hand with left hand pain.  EXAM: LEFT HAND - COMPLETE 3+ VIEW  COMPARISON:  None.  FINDINGS: There is no evidence of fracture or dislocation. There is no evidence of arthropathy or other focal bone abnormality. Soft tissues are unremarkable.  IMPRESSION: Negative.   Electronically Signed By: Abelardo Diesel M.D. On: 08/12/2018 09:16  Complexity Note: Imaging results reviewed. Results shared with Shane Cole, using Layman's terms.                        Meds   Current Outpatient Medications:     COVID-19 At Home Antigen Test [facility] COVID-19 HOME TEST) KIT, Use as directed, Disp: 4 each, Rfl: 0   diphenhydrAMINE (BENADRYL) 25 MG tablet, Take 25 mg by mouth every 6 (six) hours as needed for allergies., Disp: , Rfl:    docusate sodium (COLACE) 100 MG capsule, Take 1 capsule (100 mg total) by mouth daily as needed. To be taken after surgery, Disp: 30 capsule, Rfl: 2   famotidine (PEPCID) 20 MG tablet, Take 20 mg by mouth daily as needed for heartburn., Disp: , Rfl:    gabapentin (NEURONTIN) 300 MG capsule, Take 1 capsule (300 mg total) by mouth 3 (three) times daily., Disp: 270 capsule, Rfl: 0   ibuprofen (ADVIL) 800 MG tablet, Take 1 tablet by mouth three times daily as needed., Disp: 90 tablet, Rfl: 6   lidocaine (LIDODERM) 5 %, Place 1 patch onto the skin daily. Remove & Discard patch within 12 hours or as directed by MD, Disp: 30 patch, Rfl: 1   lidocaine (XYLOCAINE) 5 % ointment, Apply 1 application topically as needed for moderate pain., Disp: 50  g, Rfl: 2   Lidocaine 3 % CREA, Apply 1 application topically daily as needed (pain)., Disp: , Rfl:    Multiple Vitamin (MULTIVITAMIN WITH MINERALS) TABS tablet, Take 1 tablet by mouth daily., Disp: , Rfl:    Nerve Stimulator (TENS THERAPY PAIN RELIEF) DEVI, 1 application by Does not apply route daily as needed (chronic pain)., Disp: , Rfl:    nicotine (NICODERM CQ - DOSED IN MG/24 HOURS) 21 mg/24hr patch, Place onto the skin as directed, Disp: 28 patch, Rfl: 0   nicotine polacrilex (NICORETTE) 4 MG gum, USE AS DIRECTED, Disp: 110 each, Rfl: 0   nortriptyline (PAMELOR) 75 MG capsule, Take 1 capsule (75 mg total) by mouth at bedtime., Disp: 90 capsule, Rfl: 0   ondansetron (ZOFRAN ODT) 4 MG disintegrating tablet, Dissolve 1 tablet (4 mg total) by mouth every 8 (eight) hours as needed for nausea or vomiting., Disp: 20 tablet, Rfl: 2   ondansetron (ZOFRAN) 4 MG tablet, Take 1 tablet (4 mg total) by mouth every 8 (eight) hours as needed for nausea  or vomiting. To be taken after surgery, Disp: 40 tablet, Rfl: 0   oxyCODONE (ROXICODONE) 5 MG immediate release tablet, Take 1 tablet (5 mg total) by mouth 4 (four) times daily for 14 days., Disp: 56 tablet, Rfl: 0   pantoprazole (PROTONIX) 40 MG tablet, Take 1 tablet twice daily 30 to 60 minutes before breakfast and dinner. (Patient taking differently: Take 40 mg by mouth in the morning.), Disp: 60 tablet, Rfl: 5   sildenafil (REVATIO) 20 MG tablet, TAKE 1 TABLET BY MOUTH 2 TIMES DAILY, Disp: 180 tablet, Rfl: 1   tiZANidine (ZANAFLEX) 4 MG tablet, Take 1 tablet (4 mg total) by mouth 2 (two) times daily., Disp: 180 tablet, Rfl: 0  ROS  Constitutional: Denies any fever or chills Gastrointestinal: No reported hemesis, hematochezia, vomiting, or acute GI distress Musculoskeletal: Denies any acute onset joint swelling, redness, loss of ROM, or weakness Neurological: No reported episodes of acute onset apraxia, aphasia, dysarthria, agnosia, amnesia, paralysis, loss of coordination, or loss of consciousness  Allergies  Shane Cole is allergic to aspirin, codeine, penicillins, tramadol, and amitriptyline.  PFSH  Drug: Shane Cole  reports no history of drug use. Alcohol:  reports current alcohol use of about 6.0 standard drinks per week. Tobacco:  reports that he has been smoking cigarettes. He has a 5.75 pack-year smoking history. He has never used smokeless tobacco. Medical:  has a past medical history of Allergy, Anxiety, Bipolar disorder ([place]), Bronchitis, Bronchitis, Crush injury lower leg, Depression, Gastric ulcer, GERD (gastroesophageal reflux disease), History of hiatal hernia, Hypertension, Migraine, Nerve pain, and RSD (reflex sympathetic dystrophy). Surgical: Shane Cole  has a past surgical history that includes Mass excision (Left, 01/25/2013); Mouth surgery; Hand surgery; Colonoscopy; Cholecystectomy (N/A, 01/24/2014); and Total hip arthroplasty (Right, 12/03/2020). Family: family  history includes Anxiety disorder in his father and paternal aunt; Cancer in his maternal grandfather, maternal grandmother, paternal grandfather, and paternal grandmother; Colon cancer in his paternal grandfather and paternal uncle; Depression in his paternal aunt; Diabetes in his paternal grandmother; Drug abuse in his father and paternal uncle; Heart disease in an other family member; Hyperlipidemia in his father; Hypertension in his father; Lung cancer in his maternal grandfather and paternal grandfather; Stroke in an other family member.  Constitutional Exam  General appearance: Well nourished, well developed, and well hydrated. In no apparent acute distress Vitals:   05/08/21 1351  BP: (!) 159/87  Pulse: (!) 116  Resp: 16  Temp: (!) 97.2 F (36.2 C)  SpO2: 95%  Weight: 225 lb (102.1 kg)  Height: _0  (1.803 m)   BMI Assessment: Estimated body mass index is 31.38 kg/m as calculated from the following:   Height as of this encounter: _1  (1.803 m).   Weight as of this encounter: 225 lb (102.1 kg).  BMI interpretation table: BMI level Category Range association with higher incidence of chronic pain  <18 kg/m2 Underweight   18.5-24.9 kg/m2 Ideal body weight   25-29.9 kg/m2 Overweight Increased incidence by 20%  30-34.9 kg/m2 Obese (Class I) Increased incidence by 68%  35-39.9 kg/m2 Severe obesity (Class II) Increased incidence by 136%  >40 kg/m2 Extreme obesity (Class III) Increased incidence by 254%   Patient's current BMI Ideal Body weight  Body mass index is 31.38 kg/m. Ideal body weight: 75.3 kg (166 lb 0.1 oz) Adjusted ideal body weight: 86 kg (189 lb 9.7 oz)   BMI Readings from Last 4 Encounters:  05/08/21 31.38 kg/m  05/03/21 30.68 kg/m  02/20/21 31.52 kg/m  01/15/21 30.40 kg/m   Wt Readings from Last 4 Encounters:  05/08/21 225 lb (102.1 kg)  05/03/21 220 lb (99.8 kg)  02/20/21 226 lb (102.5 kg)  01/15/21 218 lb (98.9 kg)    Psych/Mental status: Alert,  oriented x 3 (person, place, & time)       Eyes: PERLA Respiratory: No evidence of acute respiratory distress  Assessment & Plan  Primary Diagnosis & Pertinent Problem List: The primary encounter diagnosis was Chronic pain syndrome. Diagnoses of Raynaud's phenomenon without gangrene, Chronic foot pain (1ry area of Pain) (Left), Chronic low back pain (2ry area of Pain) (Bilateral) w/o sciatica, Chronic shoulder pain (3ry area of Pain) (Right), Family history of avascular necrosis of hip (Right), History of total hip replacement (Right), Pharmacologic therapy, and Chronic use of opiate for therapeutic purpose were also pertinent to this visit.  Visit Diagnosis: 1. Chronic pain syndrome   2. Raynaud's phenomenon without gangrene   3. Chronic foot pain (1ry area of Pain) (Left)   4. Chronic low back pain (2ry area of Pain) (Bilateral) w/o sciatica   5. Chronic shoulder pain (3ry area of Pain) (Right)   6. Family history of avascular necrosis of hip (Right)   7. History of total hip replacement (Right)   8. Pharmacologic therapy   9. Chronic use of opiate for therapeutic purpose    Problems updated and reviewed during this visit: Problem  Raynaud Phenomenon  Family history of avascular necrosis of hip (Right)  History of total hip replacement (Right)  Reflex sympathetic dystrophy of lower extremity (Left) (Resolved)  Complex regional pain syndrome I of lower limb (Left) (Resolved)    Plan of Care  Pharmacotherapy (Medications Ordered): No orders of the defined types were placed in this encounter.  Procedure Orders    No procedure(s) ordered today   Lab Orders  No laboratory test(s) ordered today   Imaging Orders  No imaging studies ordered today   Referral Orders  No referral(s) requested today   Pharmacological management options:  Opioid Analgesics: I will not be prescribing any opioids at this time Membrane stabilizer: I will not be prescribing any at this time Muscle  relaxant: I will not be prescribing any at this time NSAID: I will not be prescribing any at this time Other analgesic(s): I will not be prescribing any at this time      Interventional Therapies  Risk   Complexity Considerations:  Estimated body mass index is 31.38 kg/m as calculated from the following:   Height as of this encounter: _0  (1.803 m).   Weight as of this encounter: 225 lb (102.1 kg). WNL   Planned   Pending:      Under consideration:   None at this time   Completed:   None at this time   Therapeutic   Palliative (PRN) options:   None established    Provider-requested follow-up: Return if symptoms worsen or fail to improve. Recent Visits No visits were found meeting these conditions. Showing recent visits within past 90 days and meeting all other requirements Today's Visits Date Type Provider Dept  05/08/21 Office Visit Milinda Pointer, MD [facility]  Showing today's visits and meeting all other requirements Future Appointments No visits were found meeting these conditions. Showing future appointments within next 90 days and meeting all other requirements  Primary Care Physician: Dorothyann Peng, NP Note by: Gaspar Cola, MD Date: 05/08/2021; Time: 2:59 PM

## 2021-05-08 ENCOUNTER — Encounter: Payer: Self-pay | Admitting: Pain Medicine

## 2021-05-08 ENCOUNTER — Other Ambulatory Visit: Payer: Self-pay

## 2021-05-08 ENCOUNTER — Ambulatory Visit: Payer: No Typology Code available for payment source | Attending: Pain Medicine | Admitting: Pain Medicine

## 2021-05-08 VITALS — BP 159/87 | HR 116 | Temp 97.2°F | Resp 16 | Ht 71.0 in | Wt 225.0 lb

## 2021-05-08 DIAGNOSIS — Z96641 Presence of right artificial hip joint: Secondary | ICD-10-CM

## 2021-05-08 DIAGNOSIS — I7300 Raynaud's syndrome without gangrene: Secondary | ICD-10-CM | POA: Insufficient documentation

## 2021-05-08 DIAGNOSIS — Z79899 Other long term (current) drug therapy: Secondary | ICD-10-CM

## 2021-05-08 DIAGNOSIS — M25511 Pain in right shoulder: Secondary | ICD-10-CM | POA: Insufficient documentation

## 2021-05-08 DIAGNOSIS — Z79891 Long term (current) use of opiate analgesic: Secondary | ICD-10-CM

## 2021-05-08 DIAGNOSIS — G894 Chronic pain syndrome: Secondary | ICD-10-CM

## 2021-05-08 DIAGNOSIS — Z8269 Family history of other diseases of the musculoskeletal system and connective tissue: Secondary | ICD-10-CM | POA: Diagnosis present

## 2021-05-08 DIAGNOSIS — M545 Low back pain, unspecified: Secondary | ICD-10-CM | POA: Diagnosis present

## 2021-05-08 DIAGNOSIS — G8929 Other chronic pain: Secondary | ICD-10-CM | POA: Diagnosis present

## 2021-05-08 DIAGNOSIS — M79672 Pain in left foot: Secondary | ICD-10-CM | POA: Insufficient documentation

## 2021-05-08 NOTE — Progress Notes (Signed)
Safety precautions to be maintained throughout the outpatient stay will include: orient to surroundings, keep bed in low position, maintain call bell within reach at all times, provide assistance with transfer out of bed and ambulation.  Patient not been here since May 2022 r/t right Hip replacement.

## 2021-05-09 ENCOUNTER — Encounter: Payer: Self-pay | Admitting: Adult Health

## 2021-05-09 ENCOUNTER — Telehealth: Payer: Self-pay | Admitting: Adult Health

## 2021-05-09 NOTE — Telephone Encounter (Signed)
Please advise 

## 2021-05-09 NOTE — Telephone Encounter (Signed)
Pt is calling and [place] pain management can not help him  Pt would like another referral to pain management for  hip,leg shoulder issues

## 2021-05-10 ENCOUNTER — Other Ambulatory Visit: Payer: Self-pay | Admitting: Adult Health

## 2021-05-10 DIAGNOSIS — G894 Chronic pain syndrome: Secondary | ICD-10-CM

## 2021-05-10 DIAGNOSIS — G90529 Complex regional pain syndrome I of unspecified lower limb: Secondary | ICD-10-CM

## 2021-05-10 NOTE — Telephone Encounter (Signed)
Please advise 

## 2021-05-10 NOTE — Telephone Encounter (Signed)
Pt is calling and needs a refill on oxyCODONE (ROXICODONE) 5 MG immediate release tablet , gabapentin (NEURONTIN) 300 MG capsule ,  nortriptyline (PAMELOR) 75 MG capsule  [facility] Phone:  [phone]  Fax:  [phone]

## 2021-05-10 NOTE — Telephone Encounter (Signed)
Left detailed message informing  of update. 

## 2021-05-13 ENCOUNTER — Other Ambulatory Visit (HOSPITAL_COMMUNITY): Payer: Self-pay

## 2021-05-13 MED ORDER — NORTRIPTYLINE HCL 75 MG PO CAPS
75.0000 mg | ORAL_CAPSULE | Freq: Every day | ORAL | 0 refills | Status: DC
  Filled 2021-05-13: qty 90, 90d supply, fill #0

## 2021-05-13 MED ORDER — GABAPENTIN 300 MG PO CAPS
300.0000 mg | ORAL_CAPSULE | Freq: Three times a day (TID) | ORAL | 0 refills | Status: DC
  Filled 2021-05-13: qty 270, 90d supply, fill #0

## 2021-05-14 ENCOUNTER — Other Ambulatory Visit (HOSPITAL_COMMUNITY): Payer: Self-pay

## 2021-05-14 MED ORDER — OXYCODONE HCL 5 MG PO TABS
5.0000 mg | ORAL_TABLET | Freq: Four times a day (QID) | ORAL | 0 refills | Status: DC
  Filled 2021-05-14: qty 28, 7d supply, fill #0

## 2021-05-16 ENCOUNTER — Ambulatory Visit (INDEPENDENT_AMBULATORY_CARE_PROVIDER_SITE_OTHER): Payer: No Typology Code available for payment source

## 2021-05-16 DIAGNOSIS — G8929 Other chronic pain: Secondary | ICD-10-CM | POA: Diagnosis not present

## 2021-05-17 ENCOUNTER — Ambulatory Visit: Payer: No Typology Code available for payment source | Admitting: Adult Health

## 2021-05-17 ENCOUNTER — Encounter: Payer: Self-pay | Admitting: Adult Health

## 2021-05-17 ENCOUNTER — Ambulatory Visit (INDEPENDENT_AMBULATORY_CARE_PROVIDER_SITE_OTHER): Payer: No Typology Code available for payment source | Admitting: Adult Health

## 2021-05-17 VITALS — BP 120/70 | HR 107 | Temp 99.3°F | Ht 71.0 in | Wt 220.0 lb

## 2021-05-17 DIAGNOSIS — G90529 Complex regional pain syndrome I of unspecified lower limb: Secondary | ICD-10-CM | POA: Diagnosis not present

## 2021-05-17 DIAGNOSIS — M130 Polyarthritis, unspecified: Secondary | ICD-10-CM

## 2021-05-17 DIAGNOSIS — G894 Chronic pain syndrome: Secondary | ICD-10-CM

## 2021-05-17 LAB — DRUG MONITOR, PANEL 1, SCREEN, URINE
Amphetamines: NEGATIVE ng/mL (ref <500)
Barbiturates: NEGATIVE ng/mL (ref <300)
Benzodiazepines: NEGATIVE ng/mL (ref <100)
Cocaine Metabolite: NEGATIVE ng/mL (ref <150)
Creatinine: 85.8 mg/dL (ref >=20.0)
Marijuana Metabolite: NEGATIVE ng/mL (ref <20)
Methadone Metabolite: POSITIVE ng/mL — AB (ref <100)
Opiates: POSITIVE ng/mL — AB (ref <100)
Oxidant: NEGATIVE ug/mL (ref <200)
Oxycodone: POSITIVE ng/mL — AB (ref <100)
Phencyclidine: NEGATIVE ng/mL (ref <25)
pH: 5.6 (ref 4.5–9.0)

## 2021-05-17 LAB — DM TEMPLATE

## 2021-05-17 NOTE — Progress Notes (Signed)
Subjective:    Patient ID: Shane Shove., male    DOB: May 23, 1980, 41 y.o.   MRN: [id number]  HPI 41 year old male who  has a past medical history of Allergy, Anxiety, Bipolar disorder (Shane Cole), Bronchitis, Bronchitis, Crush injury lower leg, Depression, Gastric ulcer, GERD (gastroesophageal reflux disease), History of hiatal hernia, Hypertension, Migraine, Nerve pain, and RSD (reflex sympathetic dystrophy).  He presents to the office today for chronic pain management.  He was seen recently by pain management clinic Shane Cole, he was advised that they could not help him  NCCSRS reviewed in EPIC   Indication for chronic opioid: chronic arthritic pain of multiple joints  Medication and dose: Oxycodone 5 mg 4 times daily, gabapentin 600 mg 3 times daily, nortriptyline 75 mg nightly, Motrin 800 mg 3 times daily, Tylenol 1000 mg every 6 hours as needed, and tizanidine 4 mg twice daily as needed. # pills per month: 120 tabs of oxycodone 5 mg monthly Last UDS date: 05/16/2021 Opioid Treatment Agreement signed: 02/14/2022 Opioid Treatment Agreement last reviewed with patient: 05/17/2021 NCCSRS reviewed this encounter (include red flags):  reviewed and no red flags.    Review of Systems See HPI   Past Medical History:  Diagnosis Date   Allergy    Anxiety    Bipolar disorder ([place])    Bronchitis    Bronchitis    Crush injury lower leg    Left lower leg   Depression    Gastric ulcer    GERD (gastroesophageal reflux disease)    will awaken him in night   History of hiatal hernia    Hypertension    Migraine    Nerve pain    RSD (reflex sympathetic dystrophy)     Social History   Socioeconomic History   Marital status: Married    Spouse name: Not on file   Number of children: 1   Years of education: 10th grade   Highest education level: Not on file  Occupational History   Occupation: Disabled    Comment: Previously worked Medical sales representative  Tobacco Use    Smoking status: Every Day    Packs/day: 0.25    Years: 23.00    Pack years: 5.75    Types: Cigarettes   Smokeless tobacco: Never   Tobacco comments:    Using nicotine patch  Vaping Use   Vaping Use: Never used  Substance and Sexual Activity   Alcohol use: Yes    Alcohol/week: 6.0 standard drinks    Types: 6 Cans of beer per week    Comment: occasional   Drug use: No   Sexual activity: Yes    Partners: Female  Other Topics Concern   Not on file  Social History Narrative   05/24/12  Shane Cole was born and grew up in [place], [place].    He has 2 sisters.    He reports that his childhood was "lousy."    He completed the 10th grade.    He has been married for 5 years, and is currently separated for 3 weeks.    He has a daughter who is 51-1/2 years old.    He has been unemployed for 2 years, and is disabled due to an on-the-job work accident.    He is currently living with his aunt and cousin.    He reports that his hobbies are sports and dogs.    He reports that he is spiritual, but not religious.  He states that his aunt and his father are his social support system.    He denies any current legal problems, but got a DUI in 2007. 05/24/12 [facility]      Social Determinants of Health   Financial Resource Strain: Not on file  Food Insecurity: Not on file  Transportation Needs: Not on file  Physical Activity: Not on file  Stress: Stress Concern Present   Feeling of Stress : To some extent  Social Connections: Unknown   Frequency of Communication with Friends and Family: Once a week   Frequency of Social Gatherings with Friends and Family: Once a week   Attends Religious Services: Not on Electrical engineer or Organizations: No   Attends Archivist Meetings: Not on file   Marital Status: Married  Human resources officer Violence: Not on file    Past Surgical History:  Procedure Laterality Date   CHOLECYSTECTOMY N/A 01/24/2014   Procedure: LAPAROSCOPIC  CHOLECYSTECTOMY;  Surgeon: Shane Ok, MD;  Location: [place];  Service: General;  Laterality: N/A;   COLONOSCOPY     HAND SURGERY     MASS EXCISION Left 01/25/2013   Procedure: EXCISION MASS DORSAL ASPECT LEFT LONG FINGER DISTAL INTERPHALANGEAL JOINT;  Surgeon: Shane Cole., MD;  Location: [place];  Service: Orthopedics;  Laterality: Left;  Left long    MOUTH SURGERY     TOTAL HIP ARTHROPLASTY Right 12/03/2020   Procedure: TOTAL HIP ARTHROPLASTY ANTERIOR APPROACH;  Surgeon: Shane Koyanagi, MD;  Location: [place];  Service: Orthopedics;  Laterality: Right;  3-C    Family History  Problem Relation Age of Onset   Hyperlipidemia Father    Hypertension Father    Anxiety disorder Father    Drug abuse Father    Cancer Paternal Grandfather        lung, colon   Colon cancer Paternal Grandfather    Lung cancer Paternal Grandfather    Diabetes Paternal Grandmother    Cancer Paternal Grandmother    Cancer Maternal Grandmother    Cancer Maternal Grandfather    Lung cancer Maternal Grandfather    Stroke Other    Heart disease Other    Depression Paternal Aunt    Anxiety disorder Paternal Aunt    Drug abuse Paternal Uncle    Colon cancer Paternal Uncle    Esophageal cancer Neg Hx    Stomach cancer Neg Hx    Rectal cancer Neg Hx     Allergies  Allergen Reactions   Aspirin Shortness Of Breath and Tinitus    Tinnitus, dizzy, short of breath   Codeine Itching    Reaction to tylenol #3   Penicillins Anaphylaxis and Other (See Comments)    Was told never to take medication.  Has patient had a PCN reaction causing immediate rash, facial/tongue/throat swelling, SOB or lightheadedness with hypotension: no Has patient had a PCN reaction causing severe rash involving mucus membranes or skin necrosis: no Has patient had a PCN reaction that required hospitalization: no Has patient had a PCN reaction occurring within the last 10 years: no If all of the above answers are  "NO", then may proceed with Cephalosporin use.    Tramadol     seizure    Amitriptyline Rash    Current Outpatient Medications on File Prior to Visit  Medication Sig Dispense Refill   COVID-19 At Home Antigen Test (CARESTART COVID-19 HOME TEST) KIT Use as directed 4 each 0   diphenhydrAMINE (  BENADRYL) 25 MG tablet Take 25 mg by mouth every 6 (six) hours as needed for allergies.     docusate sodium (COLACE) 100 MG capsule Take 1 capsule (100 mg total) by mouth daily as needed. To be taken after surgery 30 capsule 2   famotidine (PEPCID) 20 MG tablet Take 20 mg by mouth daily as needed for heartburn.     gabapentin (NEURONTIN) 300 MG capsule Take 1 capsule (300 mg total) by mouth 3 (three) times daily. 270 capsule 0   ibuprofen (ADVIL) 800 MG tablet Take 1 tablet by mouth three times daily as needed. 90 tablet 6   lidocaine (LIDODERM) 5 % Place 1 patch onto the skin daily. Remove & Discard patch within 12 hours or as directed by MD 30 patch 1   lidocaine (XYLOCAINE) 5 % ointment Apply 1 application topically as needed for moderate pain. 50 g 2   Lidocaine 3 % CREA Apply 1 application topically daily as needed (pain).     Multiple Vitamin (MULTIVITAMIN WITH MINERALS) TABS tablet Take 1 tablet by mouth daily.     Nerve Stimulator (TENS THERAPY PAIN RELIEF) DEVI 1 application by Does not apply route daily as needed (chronic pain).     nicotine (NICODERM CQ - DOSED IN MG/24 HOURS) 21 mg/24hr patch Place onto the skin as directed 28 patch 0   nicotine polacrilex (NICORETTE) 4 MG gum USE AS DIRECTED 110 each 0   nortriptyline (PAMELOR) 75 MG capsule Take 1 capsule (75 mg total) by mouth at bedtime. 90 capsule 0   ondansetron (ZOFRAN ODT) 4 MG disintegrating tablet Dissolve 1 tablet (4 mg total) by mouth every 8 (eight) hours as needed for nausea or vomiting. 20 tablet 2   ondansetron (ZOFRAN) 4 MG tablet Take 1 tablet (4 mg total) by mouth every 8 (eight) hours as needed for nausea or vomiting. To  be taken after surgery 40 tablet 0   oxyCODONE (ROXICODONE) 5 MG immediate release tablet Take 1 tablet by mouth 4 times daily for 7 days. 28 tablet 0   pantoprazole (PROTONIX) 40 MG tablet Take 1 tablet twice daily 30 to 60 minutes before breakfast and dinner. (Patient taking differently: Take 40 mg by mouth in the morning.) 60 tablet 5   sildenafil (REVATIO) 20 MG tablet TAKE 1 TABLET BY MOUTH 2 TIMES DAILY 180 tablet 1   tiZANidine (ZANAFLEX) 4 MG tablet Take 1 tablet (4 mg total) by mouth 2 (two) times daily. 180 tablet 0   No current facility-administered medications on file prior to visit.    BP 120/70    Pulse (!) 107    Temp 99.3 F (37.4 C) (Oral)    Ht 5' 11" (1.803 m)    Wt 220 lb (99.8 kg)    SpO2 95%    BMI 30.68 kg/m       Objective:   Physical Exam Vitals and nursing note reviewed.  Constitutional:      Appearance: Normal appearance.  Musculoskeletal:        General: Swelling and tenderness present. Normal range of motion.  Neurological:     Mental Status: He is alert.     Gait: Gait abnormal.  Psychiatric:        Mood and Affect: Mood normal.        Behavior: Behavior normal.        Thought Content: Thought content normal.        Judgment: Judgment normal.      Assessment &  Plan:  1. Chronic pain syndrome - Will take over pain management for him. Rules and regulations reviewed. We will try and titrate down to lowest dose possible.  - Pain contract signed - Follow up in 3 months   2. Polyarthritis   3. Complex regional pain syndrome type 1 of lower extremity, unspecified laterality   Shane Peng, NP

## 2021-05-21 ENCOUNTER — Other Ambulatory Visit: Payer: Self-pay | Admitting: Adult Health

## 2021-05-21 ENCOUNTER — Other Ambulatory Visit (HOSPITAL_COMMUNITY): Payer: Self-pay

## 2021-05-21 MED ORDER — OXYCODONE HCL 5 MG PO TABS
5.0000 mg | ORAL_TABLET | ORAL | 0 refills | Status: DC | PRN
  Filled 2021-05-21 (×2): qty 120, 20d supply, fill #0

## 2021-05-21 MED ORDER — OXYCODONE HCL 5 MG PO TABS
5.0000 mg | ORAL_TABLET | ORAL | 0 refills | Status: DC | PRN
  Filled 2021-05-21 – 2021-06-19 (×2): qty 120, 20d supply, fill #0

## 2021-05-21 MED ORDER — OXYCODONE HCL 5 MG PO TABS
5.0000 mg | ORAL_TABLET | Freq: Four times a day (QID) | ORAL | 0 refills | Status: DC
  Filled 2021-05-21 – 2021-07-22 (×2): qty 120, 30d supply, fill #0

## 2021-05-22 ENCOUNTER — Other Ambulatory Visit (HOSPITAL_COMMUNITY): Payer: Self-pay

## 2021-05-22 MED ORDER — LIDOCAINE 5 % EX PTCH
1.0000 | MEDICATED_PATCH | CUTANEOUS | 1 refills | Status: DC
  Filled 2021-05-22: qty 30, 30d supply, fill #0
  Filled 2021-06-17: qty 30, 30d supply, fill #1

## 2021-05-28 ENCOUNTER — Ambulatory Visit (INDEPENDENT_AMBULATORY_CARE_PROVIDER_SITE_OTHER): Payer: No Typology Code available for payment source | Admitting: Adult Health

## 2021-05-28 ENCOUNTER — Encounter: Payer: Self-pay | Admitting: Adult Health

## 2021-05-28 ENCOUNTER — Other Ambulatory Visit (HOSPITAL_COMMUNITY): Payer: Self-pay

## 2021-05-28 VITALS — BP 130/70 | HR 110 | Temp 99.2°F | Wt 218.0 lb

## 2021-05-28 DIAGNOSIS — L509 Urticaria, unspecified: Secondary | ICD-10-CM

## 2021-05-28 DIAGNOSIS — R300 Dysuria: Secondary | ICD-10-CM | POA: Diagnosis not present

## 2021-05-28 DIAGNOSIS — N410 Acute prostatitis: Secondary | ICD-10-CM | POA: Diagnosis not present

## 2021-05-28 DIAGNOSIS — R232 Flushing: Secondary | ICD-10-CM | POA: Diagnosis not present

## 2021-05-28 LAB — POCT URINALYSIS DIPSTICK
Bilirubin, UA: NEGATIVE
Blood, UA: NEGATIVE
Glucose, UA: NEGATIVE
Ketones, UA: POSITIVE
Leukocytes, UA: NEGATIVE
Nitrite, UA: NEGATIVE
Protein, UA: POSITIVE — AB
Spec Grav, UA: 1.025 (ref 1.010–1.025)
Urobilinogen, UA: 0.2 E.U./dL
pH, UA: 6 (ref 5.0–8.0)

## 2021-05-28 MED ORDER — CIPROFLOXACIN HCL 500 MG PO TABS
500.0000 mg | ORAL_TABLET | Freq: Two times a day (BID) | ORAL | 0 refills | Status: AC
  Filled 2021-05-28: qty 60, 30d supply, fill #0

## 2021-05-28 NOTE — Progress Notes (Signed)
Subjective:    Patient ID: Shane Cole., male    DOB: [birth year], 41 y.o.   MRN: [id number]  HPI  41 year old male who  has a past medical history of Allergy, Anxiety, Bipolar disorder (Shane Cole), Bronchitis, Bronchitis, Crush injury lower leg, Depression, Gastric ulcer, GERD (gastroesophageal reflux disease), History of hiatal hernia, Hypertension, Migraine, Nerve pain, and RSD (reflex sympathetic dystrophy).  He presents to the office today for for multiple issues.  First issue started roughly 3 months ago.  His symptoms include concentrated urine, frequency,low grade fever, fatigue, dysuria, painful bowel movements, and low back pain past baseline.  He also feels as though he has no appetite.  His second issue started roughly 2 weeks ago.  He reports a flushing sensation, diaphoresis, and then hive-like rash throughout his upper torso.  This happened while watching TV as well as when working.  Diaphoresis and hives lasted roughly 30 minutes to 1 hour.  At this time his blood pressure elevates.  Review of Systems See HPI   Past Medical History:  Diagnosis Date   Allergy    Anxiety    Bipolar disorder (Shane Cole)    Bronchitis    Bronchitis    Crush injury lower leg    Left lower leg   Depression    Gastric ulcer    GERD (gastroesophageal reflux disease)    will awaken him in night   History of hiatal hernia    Hypertension    Migraine    Nerve pain    RSD (reflex sympathetic dystrophy)     Social History   Socioeconomic History   Marital status: Married    Spouse name: Not on file   Number of children: 1   Years of education: 10th grade   Highest education level: Not on file  Occupational History   Occupation: Disabled    Comment: Previously worked Medical sales representative  Tobacco Use   Smoking status: Every Day    Packs/day: 0.25    Years: 23.00    Pack years: 5.75    Types: Cigarettes   Smokeless tobacco: Never   Tobacco comments:    Using nicotine  patch  Vaping Use   Vaping Use: Never used  Substance and Sexual Activity   Alcohol use: Yes    Alcohol/week: 6.0 standard drinks    Types: 6 Cans of beer per week    Comment: occasional   Drug use: No   Sexual activity: Yes    Partners: Female  Other Topics Concern   Not on file  Social History Narrative   05/24/12  Shane Cole was born and grew up in [place], [place].    He has 2 sisters.    He reports that his childhood was "lousy."    He completed the 10th grade.    He has been married for 5 years, and is currently separated for 3 weeks.    He has a daughter who is 75-1/2 years old.    He has been unemployed for 2 years, and is disabled due to an on-the-job work accident.    He is currently living with his aunt and cousin.    He reports that his hobbies are sports and dogs.    He reports that he is spiritual, but not religious.    He states that his aunt and his father are his social support system.    He denies any current legal problems, but got a DUI in 2007.  05/24/12 AHW      Social Determinants of Health   Financial Resource Strain: Not on file  Food Insecurity: Not on file  Transportation Needs: Not on file  Physical Activity: Not on file  Stress: Stress Concern Present   Feeling of Stress : To some extent  Social Connections: Unknown   Frequency of Communication with Friends and Family: Once a week   Frequency of Social Gatherings with Friends and Family: Once a week   Attends Religious Services: Not on Electrical engineer or Organizations: No   Attends Archivist Meetings: Not on file   Marital Status: Married  Human resources officer Violence: Not on file    Past Surgical History:  Procedure Laterality Date   CHOLECYSTECTOMY N/A 01/24/2014   Procedure: LAPAROSCOPIC CHOLECYSTECTOMY;  Surgeon: Ralene Ok, MD;  Location: [facility];  Service: General;  Laterality: N/A;   COLONOSCOPY     HAND SURGERY     MASS EXCISION Left 01/25/2013   Procedure:  EXCISION MASS DORSAL ASPECT LEFT LONG FINGER DISTAL INTERPHALANGEAL JOINT;  Surgeon: Cammie Sickle., MD;  Location: [facility];  Service: Orthopedics;  Laterality: Left;  Left long    MOUTH SURGERY     TOTAL HIP ARTHROPLASTY Right 12/03/2020   Procedure: TOTAL HIP ARTHROPLASTY ANTERIOR APPROACH;  Surgeon: Leandrew Koyanagi, MD;  Location: [facility];  Service: Orthopedics;  Laterality: Right;  3-C    Family History  Problem Relation Age of Onset   Hyperlipidemia Father    Hypertension Father    Anxiety disorder Father    Drug abuse Father    Cancer Paternal Grandfather        lung, colon   Colon cancer Paternal Grandfather    Lung cancer Paternal Grandfather    Diabetes Paternal Grandmother    Cancer Paternal Grandmother    Cancer Maternal Grandmother    Cancer Maternal Grandfather    Lung cancer Maternal Grandfather    Stroke Other    Heart disease Other    Depression Paternal Aunt    Anxiety disorder Paternal Aunt    Drug abuse Paternal Uncle    Colon cancer Paternal Uncle    Esophageal cancer Neg Hx    Stomach cancer Neg Hx    Rectal cancer Neg Hx     Allergies  Allergen Reactions   Aspirin Shortness Of Breath and Tinitus    Tinnitus, dizzy, short of breath   Codeine Itching    Reaction to tylenol #3   Penicillins Anaphylaxis and Other (See Comments)    Was told never to take medication.  Has patient had a PCN reaction causing immediate rash, facial/tongue/throat swelling, SOB or lightheadedness with hypotension: no Has patient had a PCN reaction causing severe rash involving mucus membranes or skin necrosis: no Has patient had a PCN reaction that required hospitalization: no Has patient had a PCN reaction occurring within the last 10 years: no If all of the above answers are "NO", then may proceed with Cephalosporin use.    Tramadol     seizure    Amitriptyline Rash    Current Outpatient Medications on File Prior to Visit  Medication Sig Dispense  Refill   COVID-19 At Home Antigen Test ([facility] COVID-19 HOME TEST) KIT Use as directed 4 each 0   diphenhydrAMINE (BENADRYL) 25 MG tablet Take 25 mg by mouth every 6 (six) hours as needed for allergies.     docusate sodium (COLACE) 100 MG capsule Take  1 capsule (100 mg total) by mouth daily as needed. To be taken after surgery 30 capsule 2   famotidine (PEPCID) 20 MG tablet Take 20 mg by mouth daily as needed for heartburn.     gabapentin (NEURONTIN) 300 MG capsule Take 1 capsule (300 mg total) by mouth 3 (three) times daily. 270 capsule 0   ibuprofen (ADVIL) 800 MG tablet Take 1 tablet by mouth three times daily as needed. 90 tablet 6   lidocaine (LIDODERM) 5 % Place 1 patch onto the skin daily. Remove & Discard patch within 12 hours or as directed by MD 30 patch 1   lidocaine (XYLOCAINE) 5 % ointment Apply 1 application topically as needed for moderate pain. 50 g 2   Lidocaine 3 % CREA Apply 1 application topically daily as needed (pain).     Multiple Vitamin (MULTIVITAMIN WITH MINERALS) TABS tablet Take 1 tablet by mouth daily.     Nerve Stimulator (TENS THERAPY PAIN RELIEF) DEVI 1 application by Does not apply route daily as needed (chronic pain).     nicotine (NICODERM CQ - DOSED IN MG/24 HOURS) 21 mg/24hr patch Place onto the skin as directed 28 patch 0   nicotine polacrilex (NICORETTE) 4 MG gum USE AS DIRECTED 110 each 0   nortriptyline (PAMELOR) 75 MG capsule Take 1 capsule (75 mg total) by mouth at bedtime. 90 capsule 0   ondansetron (ZOFRAN ODT) 4 MG disintegrating tablet Dissolve 1 tablet (4 mg total) by mouth every 8 (eight) hours as needed for nausea or vomiting. 20 tablet 2   ondansetron (ZOFRAN) 4 MG tablet Take 1 tablet (4 mg total) by mouth every 8 (eight) hours as needed for nausea or vomiting. To be taken after surgery 40 tablet 0   oxyCODONE (ROXICODONE) 5 MG immediate release tablet Take 1 tablet by mouth 4 times daily.  **May 2023 prescription** 120 tablet 0   oxyCODONE  (ROXICODONE) 5 MG immediate release tablet Take 1 tablet (5 mg total) by mouth every 4 (four) hours as needed for severe pain.  **April 2023 prescription** 120 tablet 0   oxyCODONE (ROXICODONE) 5 MG immediate release tablet Take 1 tablet (5 mg total) by mouth every 4 (four) hours as needed for severe pain.  **March 2023 prescription** 120 tablet 0   pantoprazole (PROTONIX) 40 MG tablet Take 1 tablet twice daily 30 to 60 minutes before breakfast and dinner. (Patient taking differently: Take 40 mg by mouth in the morning.) 60 tablet 5   sildenafil (REVATIO) 20 MG tablet TAKE 1 TABLET BY MOUTH 2 TIMES DAILY 180 tablet 1   tiZANidine (ZANAFLEX) 4 MG tablet Take 1 tablet (4 mg total) by mouth 2 (two) times daily. 180 tablet 0   No current facility-administered medications on file prior to visit.    BP 130/70    Pulse (!) 110    Temp 99.2 F (37.3 C)    Wt 218 lb (98.9 kg)    SpO2 97%    BMI 30.40 kg/m       Objective:   Physical Exam Vitals and nursing note reviewed.  Constitutional:      Appearance: Normal appearance.  Cardiovascular:     Rate and Rhythm: Normal rate and regular rhythm.     Pulses: Normal pulses.     Heart sounds: Normal heart sounds.  Pulmonary:     Effort: Pulmonary effort is normal.     Breath sounds: Normal breath sounds.  Abdominal:     General: Abdomen is  flat. Bowel sounds are normal.     Palpations: Abdomen is soft. There is no mass.     Tenderness: There is generalized abdominal tenderness and tenderness in the right lower quadrant, periumbilical area and suprapubic area. There is no rebound.     Hernia: No hernia is present.  Genitourinary:    Prostate: Tender.     Comments: Boggy prostate   Skin:    General: Skin is warm and dry.  Neurological:     General: No focal deficit present.     Mental Status: He is alert and oriented to person, place, and time.  Psychiatric:        Mood and Affect: Mood normal.        Behavior: Behavior normal.         Thought Content: Thought content normal.       Assessment & Plan:  1. Acute prostatitis -Unable to do labs today as our lab was closed.  We will start him on Cipro 500 mg twice daily x30 days.  Advise follow-up in the next couple weeks if symptoms are not resolving - ciprofloxacin (CIPRO) 500 MG tablet; Take 1 tablet (500 mg total) by mouth 2 (two) times daily.  Dispense: 60 tablet; Refill: 0  2. Dysuria  - POC Urinalysis Dipstick- negative for UTI   3. Flushing -Unknown cause.  Advised to keep a diary and follow-up in the next few weeks  4. Urticaria -Unknown cause.  Advised to keep a diary and follow-up in the next few weeks  Dorothyann Peng, NP

## 2021-06-04 ENCOUNTER — Other Ambulatory Visit: Payer: Self-pay

## 2021-06-04 ENCOUNTER — Ambulatory Visit (INDEPENDENT_AMBULATORY_CARE_PROVIDER_SITE_OTHER): Payer: No Typology Code available for payment source

## 2021-06-04 ENCOUNTER — Ambulatory Visit (INDEPENDENT_AMBULATORY_CARE_PROVIDER_SITE_OTHER): Payer: No Typology Code available for payment source | Admitting: Adult Health

## 2021-06-04 ENCOUNTER — Encounter: Payer: Self-pay | Admitting: Adult Health

## 2021-06-04 VITALS — BP 120/88 | HR 105 | Temp 98.9°F | Ht 71.0 in | Wt 216.0 lb

## 2021-06-04 DIAGNOSIS — M2550 Pain in unspecified joint: Secondary | ICD-10-CM | POA: Diagnosis not present

## 2021-06-04 DIAGNOSIS — R631 Polydipsia: Secondary | ICD-10-CM | POA: Diagnosis not present

## 2021-06-04 DIAGNOSIS — R3589 Other polyuria: Secondary | ICD-10-CM

## 2021-06-04 DIAGNOSIS — N410 Acute prostatitis: Secondary | ICD-10-CM | POA: Diagnosis not present

## 2021-06-04 DIAGNOSIS — R5383 Other fatigue: Secondary | ICD-10-CM

## 2021-06-04 LAB — CBC WITH DIFFERENTIAL/PLATELET
Basophils Absolute: 0 10*3/uL (ref 0.0–0.1)
Basophils Relative: 0.7 % (ref 0.0–3.0)
Eosinophils Absolute: 0.2 10*3/uL (ref 0.0–0.7)
Eosinophils Relative: 4.4 % (ref 0.0–5.0)
HCT: 43.8 % (ref 39.0–52.0)
Hemoglobin: 14.8 g/dL (ref 13.0–17.0)
Lymphocytes Relative: 29.6 % (ref 12.0–46.0)
Lymphs Abs: 1.7 10*3/uL (ref 0.7–4.0)
MCHC: 33.8 g/dL (ref 30.0–36.0)
MCV: 89.6 fl (ref 78.0–100.0)
Monocytes Absolute: 0.4 10*3/uL (ref 0.1–1.0)
Monocytes Relative: 6.5 % (ref 3.0–12.0)
Neutro Abs: 3.3 10*3/uL (ref 1.4–7.7)
Neutrophils Relative %: 58.8 % (ref 43.0–77.0)
Platelets: 247 10*3/uL (ref 150.0–400.0)
RBC: 4.89 Mil/uL (ref 4.22–5.81)
RDW: 13.2 % (ref 11.5–15.5)
WBC: 5.6 10*3/uL (ref 4.0–10.5)

## 2021-06-04 LAB — COMPREHENSIVE METABOLIC PANEL
ALT: 63 U/L — ABNORMAL HIGH (ref 0–53)
AST: 57 U/L — ABNORMAL HIGH (ref 0–37)
Albumin: 5 g/dL (ref 3.5–5.2)
Alkaline Phosphatase: 139 U/L — ABNORMAL HIGH (ref 39–117)
BUN: 11 mg/dL (ref 6–23)
CO2: 27 mEq/L (ref 19–32)
Calcium: 9.4 mg/dL (ref 8.4–10.5)
Chloride: 101 mEq/L (ref 96–112)
Creatinine, Ser: 0.97 mg/dL (ref 0.40–1.50)
GFR: 96.79 mL/min (ref >60.00)
Glucose, Bld: 97 mg/dL (ref 70–99)
Potassium: 4.1 mEq/L (ref 3.5–5.1)
Sodium: 137 mEq/L (ref 135–145)
Total Bilirubin: 0.5 mg/dL (ref 0.2–1.2)
Total Protein: 7 g/dL (ref 6.0–8.3)

## 2021-06-04 LAB — URINALYSIS
Bilirubin Urine: NEGATIVE
Hgb urine dipstick: NEGATIVE
Ketones, ur: NEGATIVE
Leukocytes,Ua: NEGATIVE
Nitrite: NEGATIVE
Specific Gravity, Urine: <=1.005 — AB (ref 1.000–1.030)
Total Protein, Urine: NEGATIVE
Urine Glucose: NEGATIVE
Urobilinogen, UA: 0.2 (ref 0.0–1.0)
pH: 6.5 (ref 5.0–8.0)

## 2021-06-04 LAB — SEDIMENTATION RATE: Sed Rate: 23 mm/hr — ABNORMAL HIGH (ref 0–15)

## 2021-06-04 LAB — IBC + FERRITIN
Ferritin: 52.1 ng/mL (ref 22.0–322.0)
Iron: 143 ug/dL (ref 42–165)
Saturation Ratios: 31.6 % (ref 20.0–50.0)
TIBC: 452.2 ug/dL — ABNORMAL HIGH (ref 250.0–450.0)
Transferrin: 323 mg/dL (ref 212.0–360.0)

## 2021-06-04 LAB — C-REACTIVE PROTEIN: CRP: <1 mg/dL (ref 0.5–20.0)

## 2021-06-04 LAB — PSA: PSA: 0.57 ng/mL (ref 0.10–4.00)

## 2021-06-04 IMAGING — DX DG CHEST 2V
2 series · 2 of 2 positions shown · non-contrast
Comparison: Chest x-ray [DATE]. CT abdomen and pelvis
[DATE].

CLINICAL DATA: Night sweats.

EXAM:
CHEST - 2 VIEW

[chest pa]
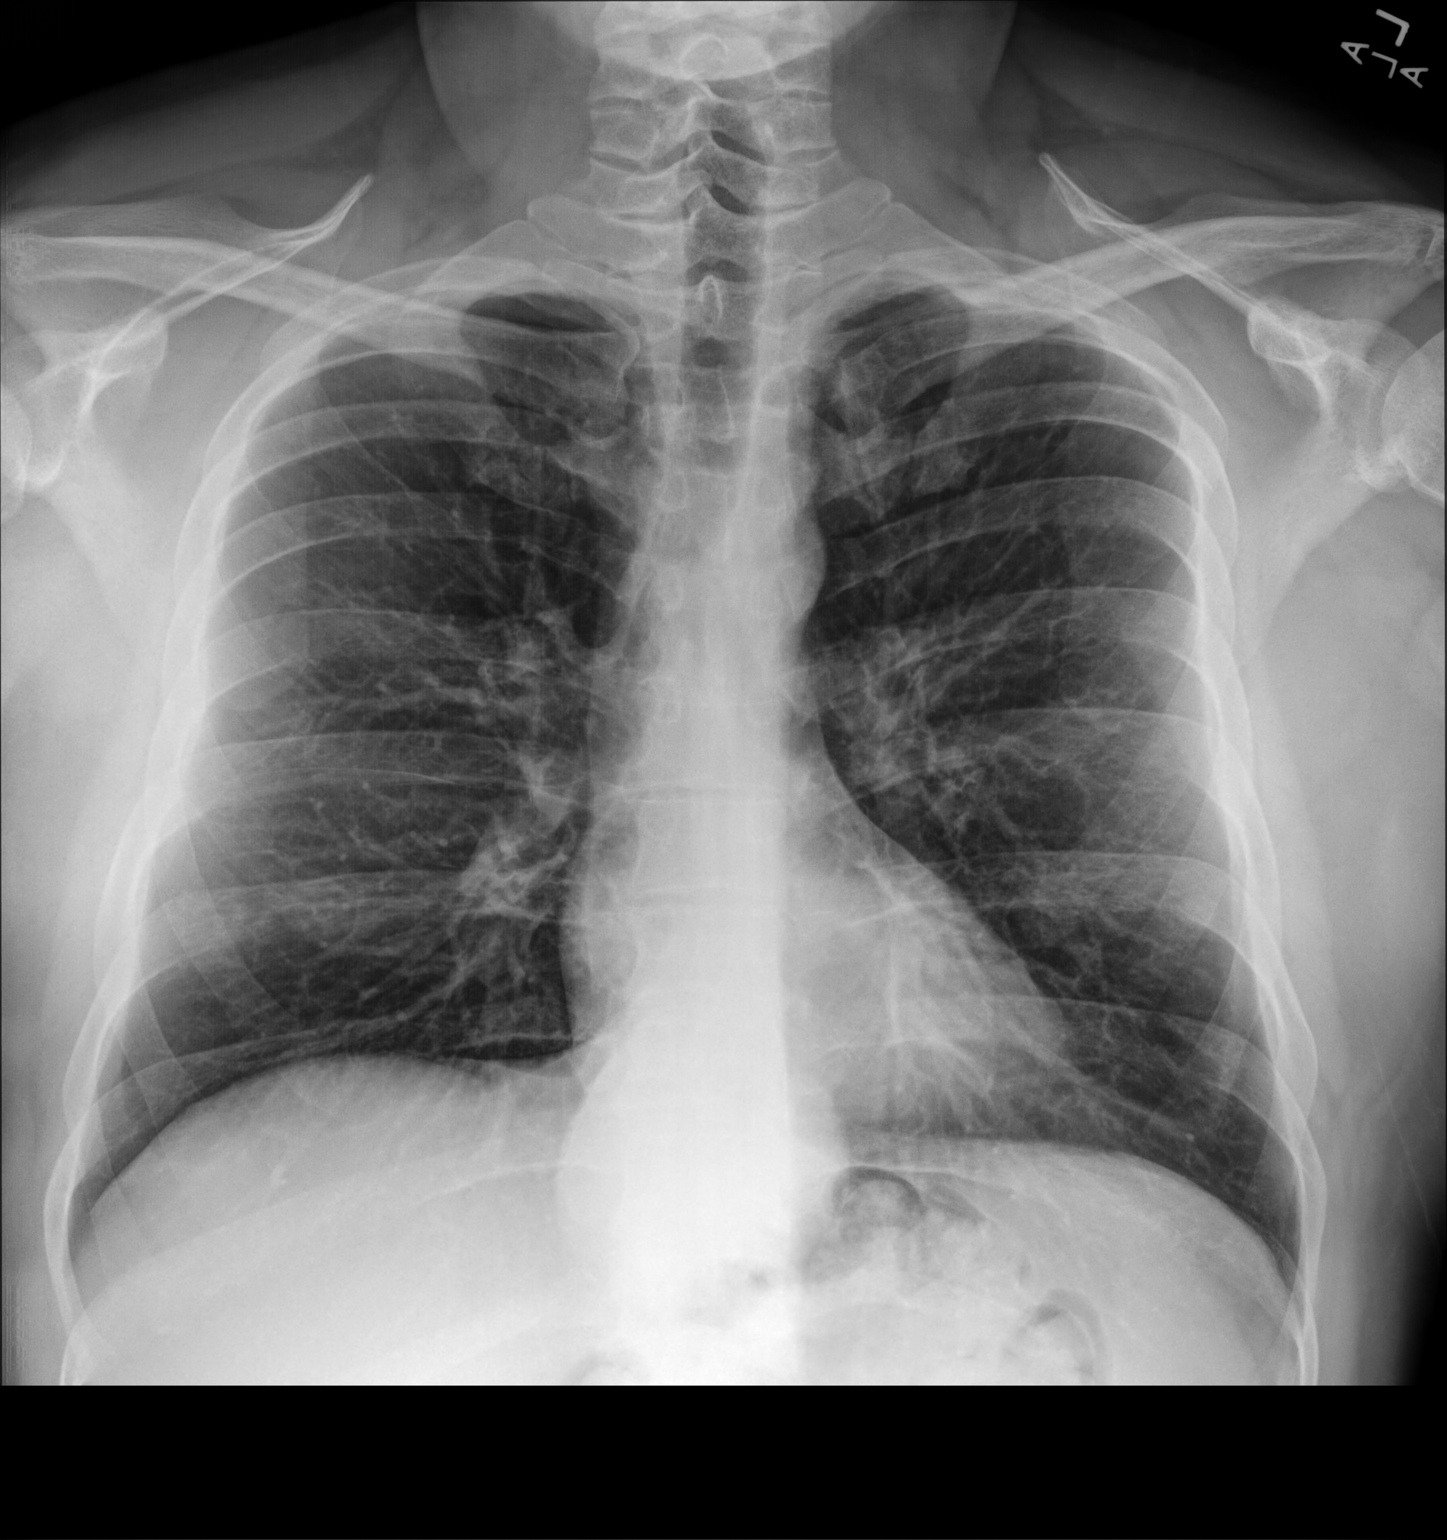

[chest lat]
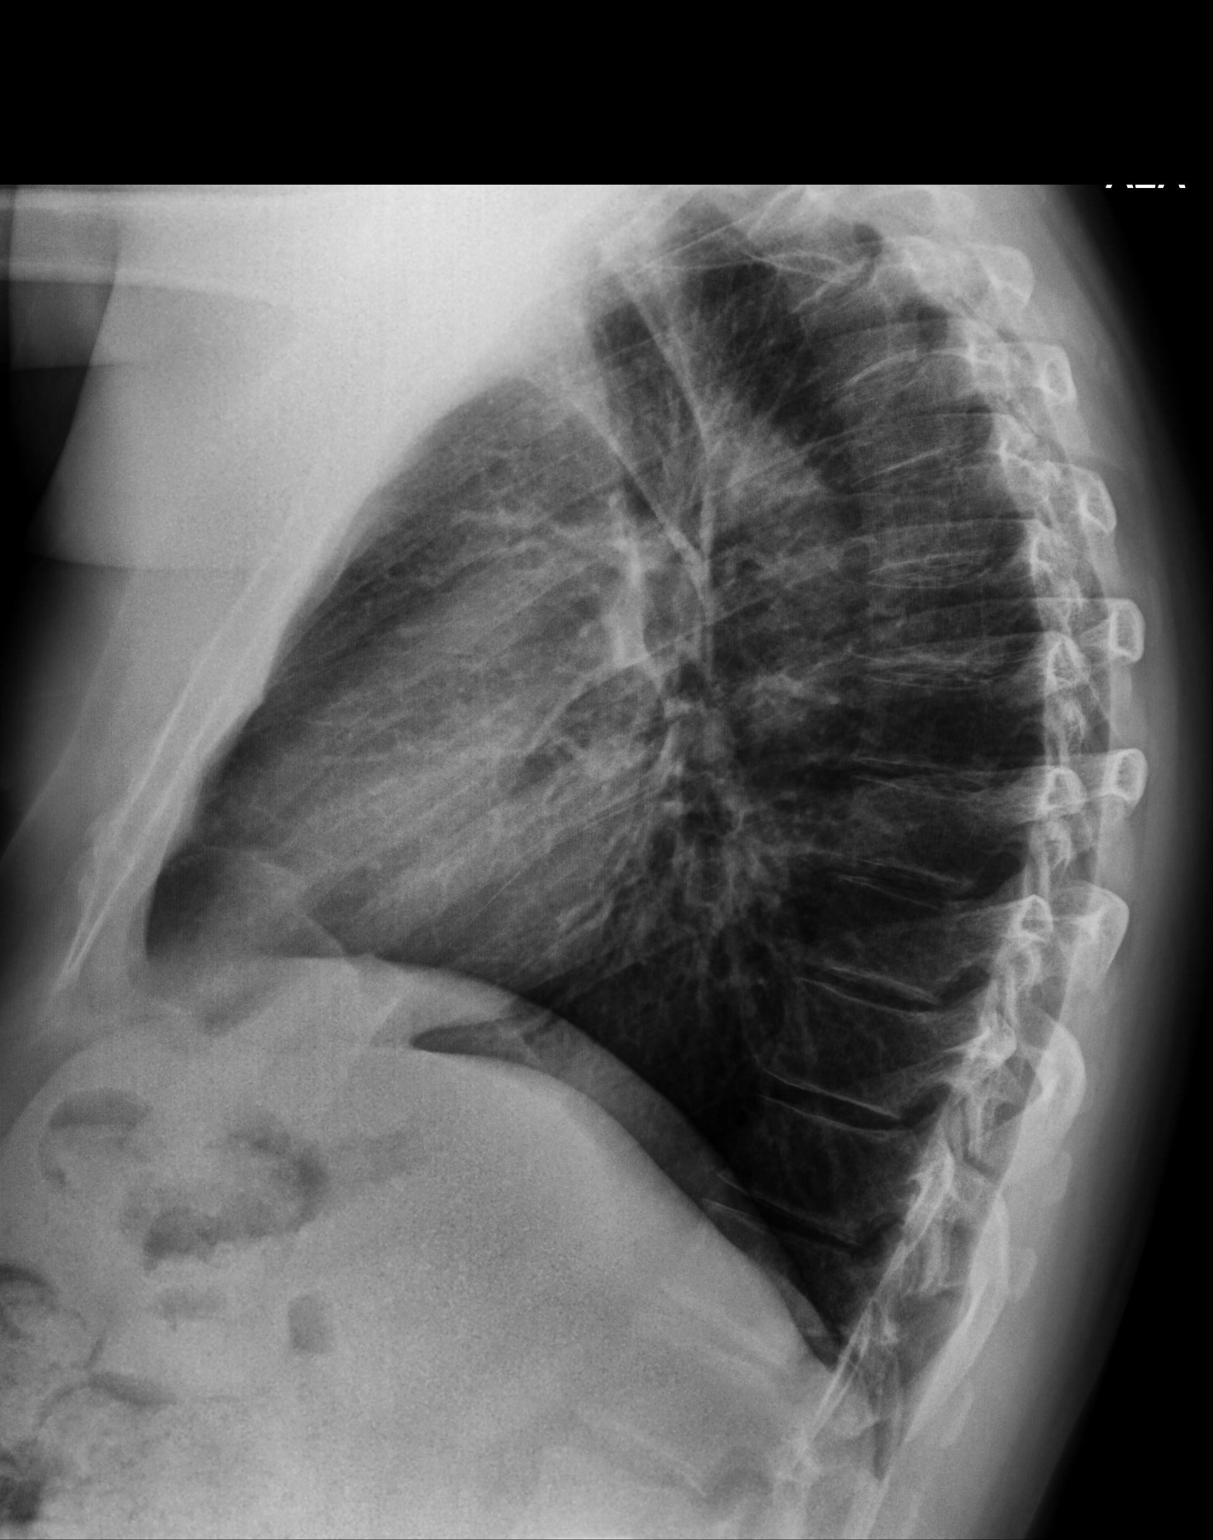

[2 of 2 positions shown; findings below may reference images not displayed]

FINDINGS: The heart size and mediastinal contours are within normal limits.
Both lungs are clear. The visualized skeletal structures are
unremarkable. Moderate size hiatal hernia is unchanged.
IMPRESSION: No active cardiopulmonary disease.

## 2021-06-04 NOTE — Progress Notes (Signed)
? ?Subjective:  ? ? Patient ID: Shane Shove., male    DOB: 07/18/1980, 41 y.o.   MRN: [id number] ? ?HPI ? ?41 year old male who  has a past medical history of Allergy, Anxiety, Bipolar disorder ([place]), Bronchitis, Bronchitis, Crush injury lower leg, Depression, Gastric ulcer, GERD (gastroesophageal reflux disease), History of hiatal hernia, Hypertension, Migraine, Nerve pain, and RSD (reflex sympathetic dystrophy). ? ?He presents to the office today for follow-up.  Was last seen 1 week ago at which time he reported 3 months of concentrated urine, urinary frequency, low-grade fever, fatigue, painful bowel movements, and low back pain past baseline.  He also felt as though he had no appetite. ? ?Exam he had generalized abdominal tenderness as well as a tender boggy prostate.  He was prescribed Cipro 500 mg twice daily x30 days for acute prostatitis.  We were unable to do lab work at this time as our lab was closed ? ?He does report that he started his antibiotics and his urine is back to being normal in color.  But since time he seen he has been experiencing other symptoms such as intermittent episodes of feeling weak, sweaty, foggy headed, unintentional weight loss, joint pain, chills, night sweats, trouble sleeping, and constipation   He reports that he always feels thirsty and as though he cannot get enough fluids. ? ?Wt Readings from Last 3 Encounters:  ?06/04/21 216 lb (98 kg)  ?05/28/21 218 lb (98.9 kg)  ?05/17/21 220 lb (99.8 kg)  ? ?Review of Systems ?See HPI  ? ?Past Medical History:  ?Diagnosis Date  ? Allergy   ? Anxiety   ? Bipolar disorder ([place])   ? Bronchitis   ? Bronchitis   ? Crush injury lower leg   ? Left lower leg  ? Depression   ? Gastric ulcer   ? GERD (gastroesophageal reflux disease)   ? will awaken him in night  ? History of hiatal hernia   ? Hypertension   ? Migraine   ? Nerve pain   ? RSD (reflex sympathetic dystrophy)   ? ? ?Social History  ? ?Socioeconomic History  ? Marital status:  Married  ?  Spouse name: Not on file  ? Number of children: 1  ? Years of education: 10th grade  ? Highest education level: Not on file  ?Occupational History  ? Occupation: Disabled  ?  Comment: Previously worked Medical sales representative  ?Tobacco Use  ? Smoking status: Every Day  ?  Packs/day: 0.25  ?  Years: 23.00  ?  Pack years: 5.75  ?  Types: Cigarettes  ? Smokeless tobacco: Never  ? Tobacco comments:  ?  Using nicotine patch  ?Vaping Use  ? Vaping Use: Never used  ?Substance and Sexual Activity  ? Alcohol use: Yes  ?  Alcohol/week: 6.0 standard drinks  ?  Types: 6 Cans of beer per week  ?  Comment: occasional  ? Drug use: No  ? Sexual activity: Yes  ?  Partners: Female  ?Other Topics Concern  ? Not on file  ?Social History Narrative  ? 05/24/12  Vasil was born and grew up in [place], [place].   ? He has 2 sisters.  ?  He reports that his childhood was "lousy."   ? He completed the 10th grade.  ?  He has been married for 5 years, and is currently separated for 3 weeks.  ?  He has a daughter who is 3-1/2 years  old.  ?  He has been unemployed for 2 years, and is disabled due to an on-the-job work accident.   ? He is currently living with his aunt and cousin.   ? He reports that his hobbies are sports and dogs.   ? He reports that he is spiritual, but not religious.   ? He states that his aunt and his father are his social support system.  ?  He denies any current legal problems, but got a DUI in 2007. 05/24/12 AHW  ?   ? ?Social Determinants of Health  ? ?Financial Resource Strain: Not on file  ?Food Insecurity: Not on file  ?Transportation Needs: Not on file  ?Physical Activity: Not on file  ?Stress: Stress Concern Present  ? Feeling of Stress : To some extent  ?Social Connections: Unknown  ? Frequency of Communication with Friends and Family: Once a week  ? Frequency of Social Gatherings with Friends and Family: Once a week  ? Attends Religious Services: Not on file  ? Active Member of Clubs or  Organizations: No  ? Attends Archivist Meetings: Not on file  ? Marital Status: Married  ?Intimate Partner Violence: Not on file  ? ? ?Past Surgical History:  ?Procedure Laterality Date  ? CHOLECYSTECTOMY N/A 01/24/2014  ? Procedure: LAPAROSCOPIC CHOLECYSTECTOMY;  Surgeon: Ralene Ok, MD;  Location: [facility];  Service: General;  Laterality: N/A;  ? COLONOSCOPY    ? HAND SURGERY    ? MASS EXCISION Left 01/25/2013  ? Procedure: EXCISION MASS DORSAL ASPECT LEFT LONG FINGER DISTAL INTERPHALANGEAL JOINT;  Surgeon: Cammie Sickle., MD;  Location: [facility];  Service: Orthopedics;  Laterality: Left;  Left long ?  ? MOUTH SURGERY    ? TOTAL HIP ARTHROPLASTY Right 12/03/2020  ? Procedure: TOTAL HIP ARTHROPLASTY ANTERIOR APPROACH;  Surgeon: Leandrew Koyanagi, MD;  Location: [facility];  Service: Orthopedics;  Laterality: Right;  3-C  ? ? ?Family History  ?Problem Relation Age of Onset  ? Hyperlipidemia Father   ? Hypertension Father   ? Anxiety disorder Father   ? Drug abuse Father   ? Cancer Paternal Grandfather   ?     lung, colon  ? Colon cancer Paternal Grandfather   ? Lung cancer Paternal Grandfather   ? Diabetes Paternal Grandmother   ? Cancer Paternal Grandmother   ? Cancer Maternal Grandmother   ? Cancer Maternal Grandfather   ? Lung cancer Maternal Grandfather   ? Stroke Other   ? Heart disease Other   ? Depression Paternal Aunt   ? Anxiety disorder Paternal Aunt   ? Drug abuse Paternal Uncle   ? Colon cancer Paternal Uncle   ? Esophageal cancer Neg Hx   ? Stomach cancer Neg Hx   ? Rectal cancer Neg Hx   ? ? ?Allergies  ?Allergen Reactions  ? Aspirin Shortness Of Breath and Tinitus  ?  Tinnitus, dizzy, short of breath  ? Codeine Itching  ?  Reaction to tylenol #3  ? Penicillins Anaphylaxis and Other (See Comments)  ?  Was told never to take medication.  ?Has patient had a PCN reaction causing immediate rash, facial/tongue/throat swelling, SOB or lightheadedness with hypotension: no ?Has patient  had a PCN reaction causing severe rash involving mucus membranes or skin necrosis: no ?Has patient had a PCN reaction that required hospitalization: no ?Has patient had a PCN reaction occurring within the last 10 years: no ?If all of the above answers  are "NO", then may proceed with Cephalosporin use. ?  ? Tramadol   ?  seizure ?  ? Amitriptyline Rash  ? ? ?Current Outpatient Medications on File Prior to Visit  ?Medication Sig Dispense Refill  ? ciprofloxacin (CIPRO) 500 MG tablet Take 1 tablet (500 mg total) by mouth 2 (two) times daily. 60 tablet 0  ? COVID-19 At Home Antigen Test [facility] COVID-19 HOME TEST) KIT Use as directed 4 each 0  ? diphenhydrAMINE (BENADRYL) 25 MG tablet Take 25 mg by mouth every 6 (six) hours as needed for allergies.    ? docusate sodium (COLACE) 100 MG capsule Take 1 capsule (100 mg total) by mouth daily as needed. To be taken after surgery 30 capsule 2  ? famotidine (PEPCID) 20 MG tablet Take 20 mg by mouth daily as needed for heartburn.    ? gabapentin (NEURONTIN) 300 MG capsule Take 1 capsule (300 mg total) by mouth 3 (three) times daily. 270 capsule 0  ? ibuprofen (ADVIL) 800 MG tablet Take 1 tablet by mouth three times daily as needed. 90 tablet 6  ? lidocaine (LIDODERM) 5 % Place 1 patch onto the skin daily. Remove & Discard patch within 12 hours or as directed by MD 30 patch 1  ? lidocaine (XYLOCAINE) 5 % ointment Apply 1 application topically as needed for moderate pain. 50 g 2  ? Lidocaine 3 % CREA Apply 1 application topically daily as needed (pain).    ? Multiple Vitamin (MULTIVITAMIN WITH MINERALS) TABS tablet Take 1 tablet by mouth daily.    ? Nerve Stimulator (TENS THERAPY PAIN RELIEF) DEVI 1 application by Does not apply route daily as needed (chronic pain).    ? nicotine (NICODERM CQ - DOSED IN MG/24 HOURS) 21 mg/24hr patch Place onto the skin as directed 28 patch 0  ? nicotine polacrilex (NICORETTE) 4 MG gum USE AS DIRECTED 110 each 0  ? nortriptyline (PAMELOR) 75 MG  capsule Take 1 capsule (75 mg total) by mouth at bedtime. 90 capsule 0  ? ondansetron (ZOFRAN ODT) 4 MG disintegrating tablet Dissolve 1 tablet (4 mg total) by mouth every 8 (eight) hours as needed for nausea o

## 2021-06-05 ENCOUNTER — Encounter: Payer: Self-pay | Admitting: Adult Health

## 2021-06-07 ENCOUNTER — Ambulatory Visit: Payer: No Typology Code available for payment source | Admitting: Adult Health

## 2021-06-07 LAB — PROTEIN ELECTROPHORESIS, SERUM
Albumin ELP: 4.5 g/dL (ref 3.8–4.8)
Alpha 1: 0.3 g/dL (ref 0.2–0.3)
Alpha 2: 0.6 g/dL (ref 0.5–0.9)
Beta 2: 0.3 g/dL (ref 0.2–0.5)
Beta Globulin: 0.5 g/dL (ref 0.4–0.6)
Gamma Globulin: 0.7 g/dL — ABNORMAL LOW (ref 0.8–1.7)
Total Protein: 7 g/dL (ref 6.1–8.1)

## 2021-06-11 ENCOUNTER — Other Ambulatory Visit: Payer: Self-pay | Admitting: Adult Health

## 2021-06-11 ENCOUNTER — Telehealth: Payer: Self-pay | Admitting: Adult Health

## 2021-06-11 ENCOUNTER — Other Ambulatory Visit (HOSPITAL_COMMUNITY): Payer: Self-pay

## 2021-06-11 DIAGNOSIS — G90529 Complex regional pain syndrome I of unspecified lower limb: Secondary | ICD-10-CM

## 2021-06-11 DIAGNOSIS — M8700 Idiopathic aseptic necrosis of unspecified bone: Secondary | ICD-10-CM

## 2021-06-11 MED ORDER — LIDOCAINE 5 % EX OINT
1.0000 "application " | TOPICAL_OINTMENT | CUTANEOUS | 2 refills | Status: DC | PRN
  Filled 2021-06-11: qty 50, 30d supply, fill #0
  Filled 2021-09-02: qty 50, 30d supply, fill #1
  Filled 2021-11-29: qty 50, 30d supply, fill #2

## 2021-06-11 NOTE — Telephone Encounter (Signed)
Pt had view his blood work on [employer] and has a question about lab results protein in blood ?

## 2021-06-12 ENCOUNTER — Other Ambulatory Visit (HOSPITAL_COMMUNITY): Payer: Self-pay

## 2021-06-12 ENCOUNTER — Other Ambulatory Visit: Payer: Self-pay | Admitting: Adult Health

## 2021-06-12 DIAGNOSIS — R350 Frequency of micturition: Secondary | ICD-10-CM

## 2021-06-12 NOTE — Telephone Encounter (Signed)
Please advise 

## 2021-06-12 NOTE — Telephone Encounter (Signed)
This has been taking care of verbally by [facility].  ?

## 2021-06-13 ENCOUNTER — Other Ambulatory Visit (HOSPITAL_COMMUNITY): Payer: Self-pay

## 2021-06-13 ENCOUNTER — Other Ambulatory Visit: Payer: No Typology Code available for payment source

## 2021-06-13 DIAGNOSIS — R350 Frequency of micturition: Secondary | ICD-10-CM

## 2021-06-13 NOTE — Addendum Note (Signed)
Addended by: Susy Manor on: [id number] 03:09 PM ? ? Modules accepted: Orders ? ?

## 2021-06-13 NOTE — Addendum Note (Signed)
Addended by: Delonta Yohannes, Skyleen Bentley S on: 06/13/2021 03:09 PM ? ? Modules accepted: Orders ? ?

## 2021-06-13 NOTE — Addendum Note (Signed)
Addended by: Amando Chaput, Genita Nilsson S on: 06/13/2021 03:09 PM ? ? Modules accepted: Orders ? ?

## 2021-06-13 NOTE — Addendum Note (Signed)
Addended by: Galena Logie, Dereona Kolodny S on: 06/13/2021 03:09 PM ? ? Modules accepted: Orders ? ?

## 2021-06-14 ENCOUNTER — Other Ambulatory Visit (HOSPITAL_COMMUNITY): Payer: Self-pay

## 2021-06-17 ENCOUNTER — Other Ambulatory Visit (HOSPITAL_COMMUNITY): Payer: Self-pay

## 2021-06-19 ENCOUNTER — Other Ambulatory Visit (HOSPITAL_COMMUNITY): Payer: Self-pay

## 2021-06-20 LAB — ARGININE VASOPRESSIN HORMONE
ADH: <0.8 pg/mL (ref 0.0–4.7)
Osmolality Meas: 282 mOsmol/kg (ref 275–295)

## 2021-06-21 ENCOUNTER — Other Ambulatory Visit (HOSPITAL_COMMUNITY): Payer: Self-pay

## 2021-06-23 ENCOUNTER — Encounter: Payer: Self-pay | Admitting: Adult Health

## 2021-06-24 ENCOUNTER — Other Ambulatory Visit (HOSPITAL_COMMUNITY): Payer: Self-pay

## 2021-06-25 ENCOUNTER — Other Ambulatory Visit: Payer: Self-pay | Admitting: Adult Health

## 2021-06-25 DIAGNOSIS — R350 Frequency of micturition: Secondary | ICD-10-CM

## 2021-07-01 ENCOUNTER — Other Ambulatory Visit: Payer: Self-pay | Admitting: Adult Health

## 2021-07-01 DIAGNOSIS — G894 Chronic pain syndrome: Secondary | ICD-10-CM

## 2021-07-02 ENCOUNTER — Other Ambulatory Visit (HOSPITAL_COMMUNITY): Payer: Self-pay

## 2021-07-03 NOTE — Telephone Encounter (Signed)
Last refill: 04/17/21 ?Last OV: 05/17/21 for pain management ?Next OV: none scheduled. ?

## 2021-07-04 ENCOUNTER — Other Ambulatory Visit (HOSPITAL_COMMUNITY): Payer: Self-pay

## 2021-07-04 MED ORDER — TIZANIDINE HCL 4 MG PO TABS
4.0000 mg | ORAL_TABLET | Freq: Two times a day (BID) | ORAL | 0 refills | Status: DC
  Filled 2021-07-04: qty 180, 90d supply, fill #0

## 2021-07-19 ENCOUNTER — Other Ambulatory Visit (HOSPITAL_COMMUNITY): Payer: Self-pay

## 2021-07-22 ENCOUNTER — Other Ambulatory Visit (HOSPITAL_COMMUNITY): Payer: Self-pay

## 2021-07-26 ENCOUNTER — Encounter: Payer: Self-pay | Admitting: Adult Health

## 2021-07-26 ENCOUNTER — Other Ambulatory Visit (HOSPITAL_COMMUNITY): Payer: Self-pay

## 2021-07-26 ENCOUNTER — Ambulatory Visit (INDEPENDENT_AMBULATORY_CARE_PROVIDER_SITE_OTHER): Payer: No Typology Code available for payment source | Admitting: Adult Health

## 2021-07-26 VITALS — BP 140/82 | HR 102 | Temp 98.1°F | Ht 71.0 in | Wt 217.0 lb

## 2021-07-26 DIAGNOSIS — R030 Elevated blood-pressure reading, without diagnosis of hypertension: Secondary | ICD-10-CM | POA: Diagnosis not present

## 2021-07-26 DIAGNOSIS — I4510 Unspecified right bundle-branch block: Secondary | ICD-10-CM

## 2021-07-26 MED ORDER — METOPROLOL SUCCINATE ER 25 MG PO TB24
25.0000 mg | ORAL_TABLET | Freq: Every day | ORAL | 0 refills | Status: DC
  Filled 2021-07-26: qty 90, 90d supply, fill #0

## 2021-07-26 NOTE — Addendum Note (Signed)
Addended by: Marian Sorrow D on: 07/26/2021 03:58 PM ? ? Modules accepted: Orders ? ?

## 2021-07-26 NOTE — Progress Notes (Signed)
? ?Subjective:  ? ? Patient ID: Shane Cole., male    DOB: [birth year], 41 y.o.   MRN: [id number] ? ?HPI ?41 year old male who  has a past medical history of Allergy, Anxiety, Bipolar disorder ([place]), Bronchitis, Bronchitis, Crush injury lower leg, Depression, Gastric ulcer, GERD (gastroesophageal reflux disease), History of hiatal hernia, Hypertension, Migraine, Nerve pain, and RSD (reflex sympathetic dystrophy). ? ?He presents to the office today for multiple issues. ? ?His last seen in March 2023 for fatigue and urinary frequency.  24-hour urine was ordered but this was never completed.  Per patient report "filled up the first jug in about 3 hours and I never came back to get any more drugs".  He does report that his frequent urination has "slowed down" but he is still urinating a lot and his urine seems to be more concentrated.  He is staying hydrated. ? ?Over the last month to month and a half he has been having variability with his blood pressures.  He noticed that most of the variabilities are at work.  Per patient "when I am standing my blood pressure skyrocket into the 160s to 170s, I will sit down for a little bit and then when I stand back up my blood pressures dropped 40 points.  He does report feeling headaches, lightheadedness, dizzy, and feeling as though he is going to pass out. ? ?Over the last 2 weeks he has been eating a lot more in order to gain weight has been eating a lot of fast food, sugar, and foods high in sodium.  His blood pressure was variable before he started eating poorly. ? ?Today in the office his orthostatics were 140 over 80s consistently ? ?He continues to feel fatigued and has had some chest discomfort incompletely  ? ? ?Review of Systems ?See HPI  ? ?Past Medical History:  ?Diagnosis Date  ? Allergy   ? Anxiety   ? Bipolar disorder ([place])   ? Bronchitis   ? Bronchitis   ? Crush injury lower leg   ? Left lower leg  ? Depression   ? Gastric ulcer   ? GERD (gastroesophageal  reflux disease)   ? will awaken him in night  ? History of hiatal hernia   ? Hypertension   ? Migraine   ? Nerve pain   ? RSD (reflex sympathetic dystrophy)   ? ? ?Social History  ? ?Socioeconomic History  ? Marital status: Married  ?  Spouse name: Not on file  ? Number of children: 1  ? Years of education: 10th grade  ? Highest education level: Not on file  ?Occupational History  ? Occupation: Disabled  ?  Comment: Previously worked Medical sales representative  ?Tobacco Use  ? Smoking status: Every Day  ?  Packs/day: 0.25  ?  Years: 23.00  ?  Pack years: 5.75  ?  Types: Cigarettes  ? Smokeless tobacco: Never  ? Tobacco comments:  ?  Using nicotine patch  ?Vaping Use  ? Vaping Use: Never used  ?Substance and Sexual Activity  ? Alcohol use: Yes  ?  Alcohol/week: 6.0 standard drinks  ?  Types: 6 Cans of beer per week  ?  Comment: occasional  ? Drug use: No  ? Sexual activity: Yes  ?  Partners: Female  ?Other Topics Concern  ? Not on file  ?Social History Narrative  ? 05/24/12  Shane Cole was born and grew up in [place], [place].   ?  He has 2 sisters.  ?  He reports that his childhood was "lousy."   ? He completed the 10th grade.  ?  He has been married for 5 years, and is currently separated for 3 weeks.  ?  He has a daughter who is 49-1/2 years old.  ?  He has been unemployed for 2 years, and is disabled due to an on-the-job work accident.   ? He is currently living with his aunt and cousin.   ? He reports that his hobbies are sports and dogs.   ? He reports that he is spiritual, but not religious.   ? He states that his aunt and his father are his social support system.  ?  He denies any current legal problems, but got a DUI in 2007. 05/24/12 [facility]  ?   ? ?Social Determinants of Health  ? ?Financial Resource Strain: Not on file  ?Food Insecurity: Not on file  ?Transportation Needs: Not on file  ?Physical Activity: Not on file  ?Stress: Stress Concern Present  ? Feeling of Stress : To some extent  ?Social Connections:  Unknown  ? Frequency of Communication with Friends and Family: Once a week  ? Frequency of Social Gatherings with Friends and Family: Once a week  ? Attends Religious Services: Not on file  ? Active Member of Clubs or Organizations: No  ? Attends Archivist Meetings: Not on file  ? Marital Status: Married  ?Intimate Partner Violence: Not on file  ? ? ?Past Surgical History:  ?Procedure Laterality Date  ? CHOLECYSTECTOMY N/A 01/24/2014  ? Procedure: LAPAROSCOPIC CHOLECYSTECTOMY;  Surgeon: Ralene Ok, MD;  Location: [facility];  Service: General;  Laterality: N/A;  ? COLONOSCOPY    ? HAND SURGERY    ? MASS EXCISION Left 01/25/2013  ? Procedure: EXCISION MASS DORSAL ASPECT LEFT LONG FINGER DISTAL INTERPHALANGEAL JOINT;  Surgeon: Cammie Sickle., MD;  Location: [facility];  Service: Orthopedics;  Laterality: Left;  Left long ?  ? MOUTH SURGERY    ? TOTAL HIP ARTHROPLASTY Right 12/03/2020  ? Procedure: TOTAL HIP ARTHROPLASTY ANTERIOR APPROACH;  Surgeon: Leandrew Koyanagi, MD;  Location: [facility];  Service: Orthopedics;  Laterality: Right;  3-C  ? ? ?Family History  ?Problem Relation Age of Onset  ? Hyperlipidemia Father   ? Hypertension Father   ? Anxiety disorder Father   ? Drug abuse Father   ? Cancer Paternal Grandfather   ?     lung, colon  ? Colon cancer Paternal Grandfather   ? Lung cancer Paternal Grandfather   ? Diabetes Paternal Grandmother   ? Cancer Paternal Grandmother   ? Cancer Maternal Grandmother   ? Cancer Maternal Grandfather   ? Lung cancer Maternal Grandfather   ? Stroke Other   ? Heart disease Other   ? Depression Paternal Aunt   ? Anxiety disorder Paternal Aunt   ? Drug abuse Paternal Uncle   ? Colon cancer Paternal Uncle   ? Esophageal cancer Neg Hx   ? Stomach cancer Neg Hx   ? Rectal cancer Neg Hx   ? ? ?Allergies  ?Allergen Reactions  ? Aspirin Shortness Of Breath and Tinitus  ?  Tinnitus, dizzy, short of breath  ? Codeine Itching  ?  Reaction to tylenol #3  ? Penicillins  Anaphylaxis and Other (See Comments)  ?  Was told never to take medication.  ?Has patient had a PCN reaction causing immediate rash, facial/tongue/throat swelling, SOB  or lightheadedness with hypotension: no ?Has patient had a PCN reaction causing severe rash involving mucus membranes or skin necrosis: no ?Has patient had a PCN reaction that required hospitalization: no ?Has patient had a PCN reaction occurring within the last 10 years: no ?If all of the above answers are "NO", then may proceed with Cephalosporin use. ?  ? Tramadol   ?  seizure ?  ? Amitriptyline Rash  ? ? ?Current Outpatient Medications on File Prior to Visit  ?Medication Sig Dispense Refill  ? COVID-19 At Home Antigen Test [facility] COVID-19 HOME TEST) KIT Use as directed 4 each 0  ? diphenhydrAMINE (BENADRYL) 25 MG tablet Take 25 mg by mouth every 6 (six) hours as needed for allergies.    ? docusate sodium (COLACE) 100 MG capsule Take 1 capsule (100 mg total) by mouth daily as needed. To be taken after surgery 30 capsule 2  ? famotidine (PEPCID) 20 MG tablet Take 20 mg by mouth daily as needed for heartburn.    ? gabapentin (NEURONTIN) 300 MG capsule Take 1 capsule (300 mg total) by mouth 3 (three) times daily. 270 capsule 0  ? ibuprofen (ADVIL) 800 MG tablet Take 1 tablet by mouth three times daily as needed. 90 tablet 6  ? lidocaine (LIDODERM) 5 % Place 1 patch onto the skin daily. Remove & Discard patch within 12 hours or as directed by MD 30 patch 1  ? lidocaine (XYLOCAINE) 5 % ointment Apply topically as needed for moderate pain. 50 g 2  ? Lidocaine 3 % CREA Apply 1 application topically daily as needed (pain).    ? Multiple Vitamin (MULTIVITAMIN WITH MINERALS) TABS tablet Take 1 tablet by mouth daily.    ? Nerve Stimulator (TENS THERAPY PAIN RELIEF) DEVI 1 application by Does not apply route daily as needed (chronic pain).    ? nicotine (NICODERM CQ - DOSED IN MG/24 HOURS) 21 mg/24hr patch Place onto the skin as directed 28 patch 0  ?  nicotine polacrilex (NICORETTE) 4 MG gum USE AS DIRECTED 110 each 0  ? nortriptyline (PAMELOR) 75 MG capsule Take 1 capsule (75 mg total) by mouth at bedtime. 90 capsule 0  ? ondansetron (ZOFRAN ODT) 4 MG disinte

## 2021-07-27 LAB — BASIC METABOLIC PANEL
BUN: 12 mg/dL (ref 7–25)
CO2: 27 mmol/L (ref 20–32)
Calcium: 9.5 mg/dL (ref 8.6–10.3)
Chloride: 101 mmol/L (ref 98–110)
Creat: 0.94 mg/dL (ref 0.60–1.29)
Glucose, Bld: 76 mg/dL (ref 65–99)
Potassium: 4.1 mmol/L (ref 3.5–5.3)
Sodium: 140 mmol/L (ref 135–146)

## 2021-07-27 LAB — URINALYSIS
Bilirubin Urine: NEGATIVE
Glucose, UA: NEGATIVE
Hgb urine dipstick: NEGATIVE
Ketones, ur: NEGATIVE
Leukocytes,Ua: NEGATIVE
Nitrite: NEGATIVE
Protein, ur: NEGATIVE
Specific Gravity, Urine: 1.01 (ref 1.001–1.035)
pH: 5.5 (ref 5.0–8.0)

## 2021-07-29 ENCOUNTER — Encounter: Payer: Self-pay | Admitting: Internal Medicine

## 2021-07-29 ENCOUNTER — Ambulatory Visit: Payer: No Typology Code available for payment source

## 2021-07-29 ENCOUNTER — Ambulatory Visit (INDEPENDENT_AMBULATORY_CARE_PROVIDER_SITE_OTHER): Payer: No Typology Code available for payment source | Admitting: Internal Medicine

## 2021-07-29 VITALS — BP 128/60 | HR 85 | Ht 71.0 in | Wt 219.0 lb

## 2021-07-29 DIAGNOSIS — R002 Palpitations: Secondary | ICD-10-CM

## 2021-07-29 DIAGNOSIS — I4510 Unspecified right bundle-branch block: Secondary | ICD-10-CM | POA: Diagnosis not present

## 2021-07-29 DIAGNOSIS — I10 Essential (primary) hypertension: Secondary | ICD-10-CM | POA: Diagnosis not present

## 2021-07-29 DIAGNOSIS — Z720 Tobacco use: Secondary | ICD-10-CM

## 2021-07-29 NOTE — Progress Notes (Unsigned)
Enrolled for [employer] to mail a ZIO XT long term holter monitor to the patients address on file.  

## 2021-07-29 NOTE — Progress Notes (Signed)
?Cardiology Office Note:   ? ?Date:  07/29/2021  ? ?ID:  Shane Cole., DOB [birth year], MRN [id number] ? ?PCP:  Dorothyann Peng, NP  ? ?[facility] Providers ?Cardiologist:  Lenna Sciara, MD ?Referring MD: Dorothyann Peng, NP  ? ?Chief Complaint/Reason for Referral: Hypertension ? ?ASSESSMENT:   ? ?1. Essential hypertension   ?2. Incomplete right bundle branch block   ?3. Palpitations   ?4. Tobacco abuse   ? ? ?PLAN:   ? ?In order of problems listed above: ?1.  Hypertension: Blood pressures well controlled on his current regimen. ?2.  Incomplete right bundle branch block: This is of no clinical consequence.  Continue monitoring for now. ?3.  Palpitations:  Will obtain monitor and echocardiogram to assess further.  Will check reflex TSH. ? ? ?   ? ?Dispo:  No follow-ups on file.  ? ?  ? ?Medication Adjustments/Labs and Tests Ordered: ?Current medicines are reviewed at length with the patient today.  Concerns regarding medicines are outlined above. ? ?The following changes have been made:  no change  ? ?Labs/tests ordered: ?Orders Placed This Encounter  ?Procedures  ? TSH Rfx on Abnormal to Free T4  ? LONG TERM MONITOR (3-14 DAYS)  ? ECHOCARDIOGRAM COMPLETE  ? ? ?Medication Changes: ?No orders of the defined types were placed in this encounter. ? ? ? ?Current medicines are reviewed at length with the patient today.  The patient does not have concerns regarding medicines. ? ? ?History of Present Illness:   ? ?FOCUSED PROBLEM LIST:   ?1.  Hypertension ?2.  GERD ?3.  Tobacco abuse ?4.  Chronic pain syndrome due to multiple injuries including crush injury of lower leg with reflex sympathetic dystrophy ? ?The patient is a 41 y.o. male with the indicated medical history here for recommendations regarding hypertension and incomplete right bundle branch block noted on EKG.  The patient was seen by his PCP last week.  He had noted that his blood pressures were relatively elevated at times at home.  His blood pressure at  that office visit was 140/82 mmHg.  An EKG which I reviewed demonstrated sinus rhythm with an incomplete right bundle branch block.  He was started on Toprol XL 25 mg. ? ?The patient denies any exertional angina.  He gets short of breath on occasion.  His biggest complaint today is palpitations that occur especially when he is at work.  They are not associated with lightheadedness or syncope.  He was recently started on Toprol-XL and is now taking this on a regular basis and seems like his blood pressures have been relatively well controlled.  He denies any paroxysmal nocturnal dyspnea or orthopnea.  He did undergo a right hip replacement earlier this year without perioperative cardiovascular complications.  He does continue to smoke but is trying to quit. ? ?  ? ?Current Medications: ?Current Meds  ?Medication Sig  ? COVID-19 At Home Antigen Test [facility] COVID-19 HOME TEST) KIT Use as directed  ? diphenhydrAMINE (BENADRYL) 25 MG tablet Take 25 mg by mouth every 6 (six) hours as needed for allergies.  ? docusate sodium (COLACE) 100 MG capsule Take 1 capsule (100 mg total) by mouth daily as needed. To be taken after surgery  ? famotidine (PEPCID) 20 MG tablet Take 20 mg by mouth daily as needed for heartburn.  ? gabapentin (NEURONTIN) 300 MG capsule Take 1 capsule (300 mg total) by mouth 3 (three) times daily.  ? ibuprofen (ADVIL) 800 MG tablet Take 1 tablet  by mouth three times daily as needed.  ? lidocaine (LIDODERM) 5 % Place 1 patch onto the skin daily. Remove & Discard patch within 12 hours or as directed by MD  ? lidocaine (XYLOCAINE) 5 % ointment Apply topically as needed for moderate pain.  ? Lidocaine 3 % CREA Apply 1 application topically daily as needed (pain).  ? metoprolol succinate (TOPROL-XL) 25 MG 24 hr tablet Take 1 tablet (25 mg total) by mouth daily.  ? Multiple Vitamin (MULTIVITAMIN WITH MINERALS) TABS tablet Take 1 tablet by mouth daily.  ? Nerve Stimulator (TENS THERAPY PAIN RELIEF) DEVI 1  application by Does not apply route daily as needed (chronic pain).  ? nicotine (NICODERM CQ - DOSED IN MG/24 HOURS) 21 mg/24hr patch Place onto the skin as directed  ? nicotine polacrilex (NICORETTE) 4 MG gum USE AS DIRECTED  ? nortriptyline (PAMELOR) 75 MG capsule Take 1 capsule (75 mg total) by mouth at bedtime.  ? ondansetron (ZOFRAN ODT) 4 MG disintegrating tablet Dissolve 1 tablet (4 mg total) by mouth every 8 (eight) hours as needed for nausea or vomiting.  ? ondansetron (ZOFRAN) 4 MG tablet Take 1 tablet (4 mg total) by mouth every 8 (eight) hours as needed for nausea or vomiting. To be taken after surgery  ? oxyCODONE (ROXICODONE) 5 MG immediate release tablet Take 1 tablet by mouth 4 times daily.  **May 2023 prescription**  ? oxyCODONE (ROXICODONE) 5 MG immediate release tablet Take 1 tablet (5 mg total) by mouth every 4 (four) hours as needed for severe pain.  ? oxyCODONE (ROXICODONE) 5 MG immediate release tablet Take 1 tablet (5 mg total) by mouth every 4 (four) hours as needed for severe pain.  **March 2023 prescription**  ? tiZANidine (ZANAFLEX) 4 MG tablet Take 1 tablet  by mouth 2 times daily.  ?  ? ?Allergies:    ?Aspirin, Codeine, Penicillins, Tramadol, and Amitriptyline  ? ?Social History:   ?Social History  ? ?Tobacco Use  ? Smoking status: Every Day  ?  Packs/day: 0.25  ?  Years: 23.00  ?  Pack years: 5.75  ?  Types: Cigarettes  ? Smokeless tobacco: Never  ? Tobacco comments:  ?  Using nicotine patch  ?Vaping Use  ? Vaping Use: Never used  ?Substance Use Topics  ? Alcohol use: Yes  ?  Alcohol/week: 6.0 standard drinks  ?  Types: 6 Cans of beer per week  ?  Comment: occasional  ? Drug use: No  ?  ? ?Family Hx: ?Family History  ?Problem Relation Age of Onset  ? Hyperlipidemia Father   ? Hypertension Father   ? Anxiety disorder Father   ? Drug abuse Father   ? Cancer Paternal Grandfather   ?     lung, colon  ? Colon cancer Paternal Grandfather   ? Lung cancer Paternal Grandfather   ? Diabetes  Paternal Grandmother   ? Cancer Paternal Grandmother   ? Cancer Maternal Grandmother   ? Cancer Maternal Grandfather   ? Lung cancer Maternal Grandfather   ? Stroke Other   ? Heart disease Other   ? Depression Paternal Aunt   ? Anxiety disorder Paternal Aunt   ? Drug abuse Paternal Uncle   ? Colon cancer Paternal Uncle   ? Esophageal cancer Neg Hx   ? Stomach cancer Neg Hx   ? Rectal cancer Neg Hx   ?  ? ?Review of Systems:   ?Please see the history of present illness.    ?  All other systems reviewed and are negative. ?  ? ? ?EKGs/Labs/Other Test Reviewed:   ? ?EKG:  EKG performed Jul 26, 2021 that I personally reviewed demonstrates sinus rhythm with incomplete right bundle branch block. ? ?Prior CV studies: ? ?Lower extremity Dopplers 2022 without DVT ? ?Other studies Reviewed: ?Review of the additional studies/records demonstrates: CT abdomen pelvis 2022 without aortic atherosclerosis or aortic aneurysm ? ?Recent Labs: ?07/30/2020: Magnesium 1.8 ?06/04/2021: ALT 63; Hemoglobin 14.8; Platelets 247.0 ?07/26/2021: BUN 12; Creat 0.94; Potassium 4.1; Sodium 140  ? ?Recent Lipid Panel ?Lab Results  ?Component Value Date/Time  ? CHOL 220 (H) 07/03/2020 02:02 PM  ? TRIG 82.0 07/03/2020 02:02 PM  ? HDL 83.80 07/03/2020 02:02 PM  ? LDLCALC 119 (H) 07/03/2020 02:02 PM  ? ? ?Risk Assessment/Calculations:   ? ?  ?    ? ?Physical Exam:   ? ?VS:  BP 128/60   Pulse 85   Ht _0  (1.803 m)   Wt 219 lb (99.3 kg)   SpO2 98%   BMI 30.54 kg/m?    ?Wt Readings from Last 3 Encounters:  ?07/29/21 219 lb (99.3 kg)  ?07/26/21 217 lb (98.4 kg)  ?06/04/21 216 lb (98 kg)  ?  ?GENERAL:  No apparent distress, AOx3 ?HEENT:  No carotid bruits, +2 carotid impulses, no scleral icterus ?CAR: RRR no murmurs, gallops, rubs, or thrills ?RES:  Clear to auscultation bilaterally ?ABD:  Soft, nontender, nondistended, positive bowel sounds x 4 ?VASC:  +2 radial pulses, +2 carotid pulses, palpable pedal pulses ?NEURO:  CN 2-12 grossly intact; motor and  sensory grossly intact ?PSYCH:  No active depression or anxiety ?EXT:  No edema, ecchymosis, or cyanosis ? ?Signed, ?Early Osmond, MD  ?07/29/2021 5:05 PM    ?[facility] ?[address] [place], [place]

## 2021-07-29 NOTE — Patient Instructions (Signed)
Medication Instructions:  ?No changes ?*If you need a refill on your cardiac medications before your next appointment, please call your pharmacy* ? ? ?Lab Work: ?Today: TSH reflex T4 if abnormal ? ?If you have labs (blood work) drawn today and your tests are completely normal, you will receive your results only by: ?MyChart Message (if you have MyChart) OR ?A paper copy in the mail ?If you have any lab test that is abnormal or we need to change your treatment, we will call you to review the results. ? ? ?Testing/Procedures: ?Your physician has requested that you have an echocardiogram. Echocardiography is a painless test that uses sound waves to create images of your heart. It provides your doctor with information about the size and shape of your heart and how well your heart?s chambers and valves are working. This procedure takes approximately one hour. There are no restrictions for this procedure. ? ?3 Day Zio Heart Monitor ? ? ?Follow-Up: ?As needed based on results.1}  ? ?Important Information About Sugar ? ? ? ? ?  ?

## 2021-07-30 LAB — TSH RFX ON ABNORMAL TO FREE T4: TSH: 1.74 u[IU]/mL (ref 0.450–4.500)

## 2021-08-02 ENCOUNTER — Ambulatory Visit: Payer: No Typology Code available for payment source | Admitting: Adult Health

## 2021-08-05 NOTE — Telephone Encounter (Signed)
err

## 2021-08-09 ENCOUNTER — Ambulatory Visit (INDEPENDENT_AMBULATORY_CARE_PROVIDER_SITE_OTHER): Payer: No Typology Code available for payment source | Admitting: Orthopaedic Surgery

## 2021-08-09 ENCOUNTER — Ambulatory Visit (INDEPENDENT_AMBULATORY_CARE_PROVIDER_SITE_OTHER): Payer: No Typology Code available for payment source

## 2021-08-09 DIAGNOSIS — M25552 Pain in left hip: Secondary | ICD-10-CM | POA: Diagnosis not present

## 2021-08-09 NOTE — Progress Notes (Signed)
Office Visit Note   Patient: Shane Landryaul D Kock Jr.           Date of Birth: 11/03/1980           MRN: [id number] Visit Date: 08/09/2021              Requested by: Shirline FreesNafziger, Cory, NP [address] [place] [id number] PCP: Shirline FreesNafziger, Cory, NP   Assessment & Plan: Visit Diagnoses:  1. Pain of left hip     Plan: Based on findings is difficult to determine if this is coming from his hip or his back.  He states that his right hip originally started hurting in the buttock which then migrated to the groin.  With worsening symptoms I would like to get an updated MR arthrogram to assess for progression of AVN.  However he does report chronic low back pain so would like to get MRI of the lumbar spine as well.  Follow-up after the MRIs.  Follow-Up Instructions: No follow-ups on file.   Orders:  Orders Placed This Encounter  Procedures   XR HIP UNILAT W OR W/O PELVIS 2-3 VIEWS LEFT   MR Lumbar Spine w/o contrast   MR Hip Left w/ contrast   Arthrogram   No orders of the defined types were placed in this encounter.     Procedures: No procedures performed   Clinical Data: No additional findings.   Subjective: Chief Complaint  Patient presents with   Left Hip - Pain    HPI Shane Cole comes in today for constant left hip pain that wakes him up at night.  He uses lidocaine patches at times.  He had an MRI about a year ago which showed a tiny focus of AVN in the left femoral head.  He has done well from his right hip replacement.  He is reporting pain that is constant in the buttock and in the back.  Denies any groin pain. Review of Systems  Constitutional: Negative.   All other systems reviewed and are negative.   Objective: Vital Signs: There were no vitals taken for this visit.  Physical Exam Vitals and nursing note reviewed.  Constitutional:      Appearance: He is well-developed.  Pulmonary:     Effort: Pulmonary effort is normal.  Abdominal:     Palpations:  Abdomen is soft.  Skin:    General: Skin is warm.  Neurological:     Mental Status: He is alert and oriented to person, place, and time.  Psychiatric:        Behavior: Behavior normal.        Thought Content: Thought content normal.        Judgment: Judgment normal.    Ortho Exam Examination left hip shows good range of motion without pain to the groin or deep in the hip.  Lateral hip is nontender.  He feels pain in the buttock area but this is not reproducible on palpation.  No sciatic tension signs.  Lumbar spine is slightly tender. Specialty Comments:  No specialty comments available.  Imaging: XR HIP UNILAT W OR W/O PELVIS 2-3 VIEWS LEFT  Result Date: 08/09/2021 Stable right total hip replacement without complication.  No radiographic evidence of progression of left hip AVN.    PMFS History: Patient Active Problem List   Diagnosis Date Noted   Pain of left hip 08/09/2021   Raynaud phenomenon 05/08/2021   Family history of avascular necrosis of hip (Right) 05/08/2021   History of  total hip replacement (Right) 12/03/2020   Pharmacologic therapy 07/30/2020   Disorder of skeletal system 07/30/2020   Problems influencing health status 07/30/2020   Avascular necrosis of hip (Right) (HCC) 07/30/2020   Chronic hip pain (Right) 07/30/2020   Chronic low back pain (2ry area of Pain) (Bilateral) w/o sciatica 07/30/2020   Chronic shoulder pain (3ry area of Pain) (Right) 07/30/2020   Auricular cyst 01/21/2018   Laryngopharyngeal reflux (LPR) 01/21/2018   GERD (gastroesophageal reflux disease) 07/16/2016   Internal hemorrhoid 07/16/2016   Abdominal pain, chronic, epigastric 07/16/2016   Rectal bleeding 07/16/2016   Essential hypertension 02/26/2016   Adhesive capsulitis of right shoulder 07/16/2015   Thoracic outlet syndrome 02/12/2015   Chronic narcotic use 10/26/2014   Opioid contract exists 10/26/2014   Smoker 05/11/2014   Coccygeal pain, acute 05/18/2012   Chronic pain  syndrome 05/18/2012   Chronic diarrhea 03/29/2012   Bipolar disorder (HCC) 03/29/2012   Chronic foot pain (1ry area of Pain) (Left) 06/11/2011   Past Medical History:  Diagnosis Date   Allergy    Anxiety    Bipolar disorder (HCC)    Bronchitis    Bronchitis    Crush injury lower leg    Left lower leg   Depression    Gastric ulcer    GERD (gastroesophageal reflux disease)    will awaken him in night   History of hiatal hernia    Hypertension    Migraine    Nerve pain    RSD (reflex sympathetic dystrophy)     Family History  Problem Relation Age of Onset   Hyperlipidemia Father    Hypertension Father    Anxiety disorder Father    Drug abuse Father    Cancer Paternal Grandfather        lung, colon   Colon cancer Paternal Grandfather    Lung cancer Paternal Grandfather    Diabetes Paternal Grandmother    Cancer Paternal Grandmother    Cancer Maternal Grandmother    Cancer Maternal Grandfather    Lung cancer Maternal Grandfather    Stroke Other    Heart disease Other    Depression Paternal Aunt    Anxiety disorder Paternal Aunt    Drug abuse Paternal Uncle    Colon cancer Paternal Uncle    Esophageal cancer Neg Hx    Stomach cancer Neg Hx    Rectal cancer Neg Hx     Past Surgical History:  Procedure Laterality Date   CHOLECYSTECTOMY N/A 01/24/2014   Procedure: LAPAROSCOPIC CHOLECYSTECTOMY;  Surgeon: Axel Filler, MD;  Location: [facility];  Service: General;  Laterality: N/A;   COLONOSCOPY     HAND SURGERY     MASS EXCISION Left 01/25/2013   Procedure: EXCISION MASS DORSAL ASPECT LEFT LONG FINGER DISTAL INTERPHALANGEAL JOINT;  Surgeon: Wyn Forster., MD;  Location: [facility];  Service: Orthopedics;  Laterality: Left;  Left long    MOUTH SURGERY     TOTAL HIP ARTHROPLASTY Right 12/03/2020   Procedure: TOTAL HIP ARTHROPLASTY ANTERIOR APPROACH;  Surgeon: Tarry Kos, MD;  Location: [facility];  Service: Orthopedics;  Laterality: Right;  3-C   Social  History   Occupational History   Occupation: Disabled    Comment: Previously worked Web designer  Tobacco Use   Smoking status: Every Day    Packs/day: 0.25    Years: 23.00    Pack years: 5.75    Types: Cigarettes   Smokeless tobacco: Never  Tobacco comments:    Using nicotine patch  Vaping Use   Vaping Use: Never used  Substance and Sexual Activity   Alcohol use: Yes    Alcohol/week: 6.0 standard drinks    Types: 6 Cans of beer per week    Comment: occasional   Drug use: No   Sexual activity: Yes    Partners: Female

## 2021-08-16 ENCOUNTER — Ambulatory Visit (HOSPITAL_COMMUNITY): Payer: No Typology Code available for payment source | Attending: Internal Medicine

## 2021-08-16 DIAGNOSIS — R002 Palpitations: Secondary | ICD-10-CM | POA: Insufficient documentation

## 2021-08-16 DIAGNOSIS — Z720 Tobacco use: Secondary | ICD-10-CM | POA: Insufficient documentation

## 2021-08-16 DIAGNOSIS — I4510 Unspecified right bundle-branch block: Secondary | ICD-10-CM | POA: Diagnosis not present

## 2021-08-16 DIAGNOSIS — I10 Essential (primary) hypertension: Secondary | ICD-10-CM | POA: Insufficient documentation

## 2021-08-16 LAB — ECHOCARDIOGRAM COMPLETE
Area-P 1/2: 2.92 cm2
S' Lateral: 2.9 cm

## 2021-08-20 ENCOUNTER — Ambulatory Visit (INDEPENDENT_AMBULATORY_CARE_PROVIDER_SITE_OTHER): Payer: No Typology Code available for payment source | Admitting: Adult Health

## 2021-08-20 ENCOUNTER — Other Ambulatory Visit (HOSPITAL_COMMUNITY): Payer: Self-pay

## 2021-08-20 ENCOUNTER — Encounter: Payer: Self-pay | Admitting: Adult Health

## 2021-08-20 VITALS — BP 120/60 | HR 86 | Temp 98.0°F | Ht 71.0 in | Wt 223.0 lb

## 2021-08-20 DIAGNOSIS — G894 Chronic pain syndrome: Secondary | ICD-10-CM

## 2021-08-20 DIAGNOSIS — G90529 Complex regional pain syndrome I of unspecified lower limb: Secondary | ICD-10-CM | POA: Diagnosis not present

## 2021-08-20 DIAGNOSIS — M130 Polyarthritis, unspecified: Secondary | ICD-10-CM | POA: Diagnosis not present

## 2021-08-20 DIAGNOSIS — R52 Pain, unspecified: Secondary | ICD-10-CM

## 2021-08-20 MED ORDER — OXYCODONE HCL 5 MG PO TABS
10.0000 mg | ORAL_TABLET | Freq: Three times a day (TID) | ORAL | 0 refills | Status: AC
  Filled 2021-08-20: qty 84, 14d supply, fill #0

## 2021-08-20 MED ORDER — OXYCODONE HCL 5 MG PO TABS
5.0000 mg | ORAL_TABLET | ORAL | 0 refills | Status: DC | PRN
  Filled 2021-08-20 – 2021-10-22 (×3): qty 120, 20d supply, fill #0

## 2021-08-20 MED ORDER — OXYCODONE HCL 5 MG PO TABS
5.0000 mg | ORAL_TABLET | ORAL | 0 refills | Status: DC | PRN
  Filled 2021-08-20 – 2021-09-02 (×2): qty 120, 20d supply, fill #0

## 2021-08-20 MED ORDER — NORTRIPTYLINE HCL 75 MG PO CAPS
75.0000 mg | ORAL_CAPSULE | Freq: Every day | ORAL | 0 refills | Status: DC
  Filled 2021-08-20: qty 90, 90d supply, fill #0

## 2021-08-20 MED ORDER — GABAPENTIN 300 MG PO CAPS
300.0000 mg | ORAL_CAPSULE | Freq: Three times a day (TID) | ORAL | 0 refills | Status: DC
  Filled 2021-08-20: qty 270, 90d supply, fill #0

## 2021-08-20 MED ORDER — OXYCODONE HCL 5 MG PO TABS
5.0000 mg | ORAL_TABLET | Freq: Four times a day (QID) | ORAL | 0 refills | Status: AC
  Filled 2021-08-20 – 2021-09-23 (×2): qty 120, 30d supply, fill #0

## 2021-08-20 NOTE — Progress Notes (Unsigned)
   Subjective:    Patient ID: Shane Cole., male    DOB: 03-06-1981, 41 y.o.   MRN: [id number]  HPI NCCSRS reviewed in EPIC   Indication for chronic opioid: *** Medication and dose: *** # pills per month: *** Last UDS date: *** Opioid Treatment Agreement signed: *** Opioid Treatment Agreement last reviewed with patient: *** NCCSRS reviewed this encounter (include red flags):  ***   Review of Systems     Objective:   Physical Exam        Assessment & Plan:

## 2021-08-21 ENCOUNTER — Other Ambulatory Visit (HOSPITAL_COMMUNITY): Payer: Self-pay

## 2021-08-22 LAB — DRUG MONITOR, PANEL 1, SCREEN, URINE
Amphetamines: NEGATIVE ng/mL (ref <500)
Barbiturates: NEGATIVE ng/mL (ref <300)
Benzodiazepines: NEGATIVE ng/mL (ref <100)
Cocaine Metabolite: NEGATIVE ng/mL (ref <150)
Creatinine: 87.1 mg/dL (ref >=20.0)
Marijuana Metabolite: NEGATIVE ng/mL (ref <20)
Methadone Metabolite: NEGATIVE ng/mL (ref <100)
Opiates: NEGATIVE ng/mL (ref <100)
Oxidant: NEGATIVE ug/mL (ref <200)
Oxycodone: NEGATIVE ng/mL (ref <100)
Phencyclidine: NEGATIVE ng/mL (ref <25)
pH: 7.2 (ref 4.5–9.0)

## 2021-08-22 LAB — DM TEMPLATE

## 2021-08-25 ENCOUNTER — Ambulatory Visit
Admission: RE | Admit: 2021-08-25 | Discharge: 2021-08-25 | Disposition: A | Discharge status: Home / Self Care | Payer: No Typology Code available for payment source | Source: Ambulatory Visit | Attending: Orthopaedic Surgery | Admitting: Orthopaedic Surgery

## 2021-08-25 DIAGNOSIS — M25552 Pain in left hip: Secondary | ICD-10-CM

## 2021-08-25 IMAGING — MR MR LUMBAR SPINE W/O CM
4 of 6 series · 20 of 48 positions shown · non-contrast
Comparison: Prior radiograph from [DATE].

CLINICAL DATA: Initial evaluation for lower back pain with left hip
pain.

EXAM:
MRI LUMBAR SPINE WITHOUT CONTRAST
TECHNIQUE: Multiplanar, multisequence MR imaging of the lumbar spine was
performed. No intravenous contrast was administered.

[Series 5: T2 · sagittal · 4.0mm · 0.73mm/px · 6 of 15 slices shown (1 of 3)]
[im 1/15]
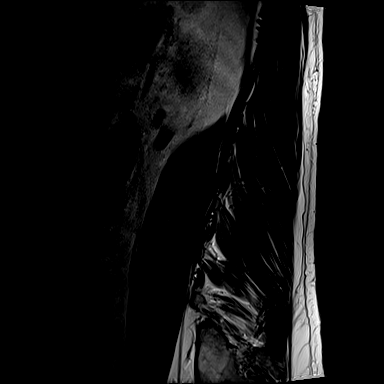
[im 3/15]
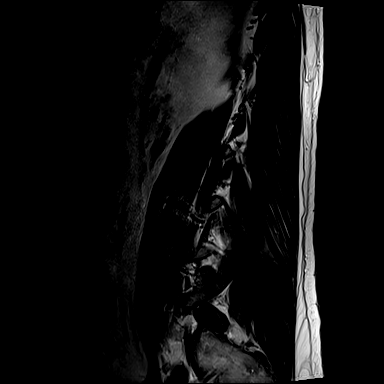
[im 6/15]
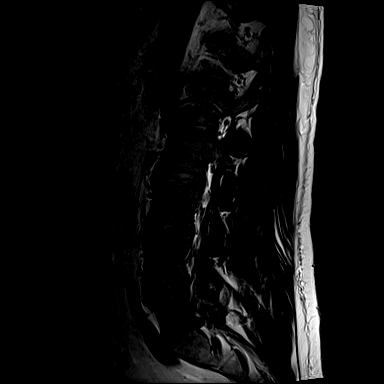
[im 9/15]
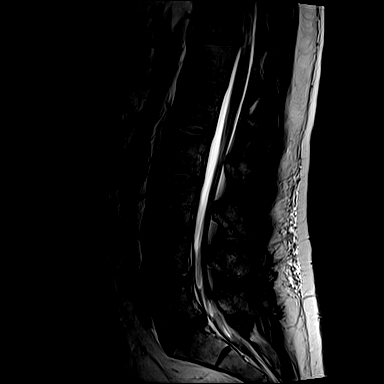
[im 12/15]
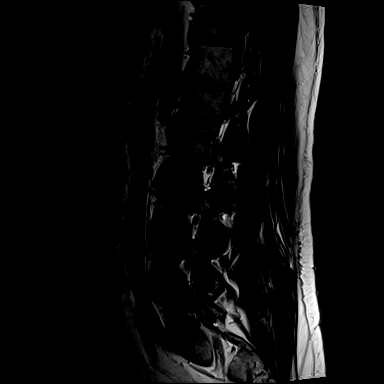
[im 15/15]
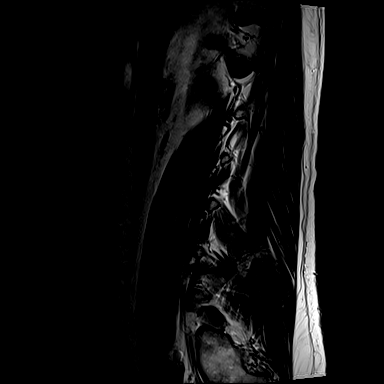

[Series 6: T1 · sagittal · 4.0mm · 0.73mm/px · 3 of 15 slices shown]
[im 3/15]
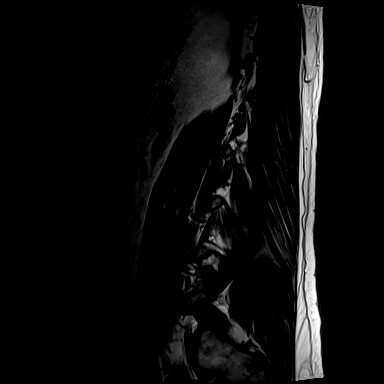
[im 9/15]
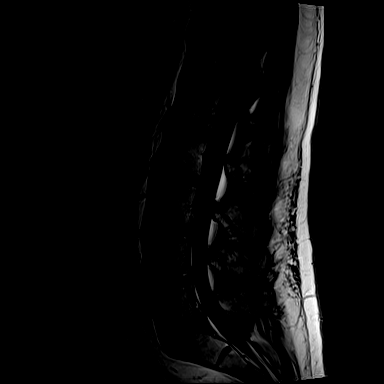
[im 15/15]
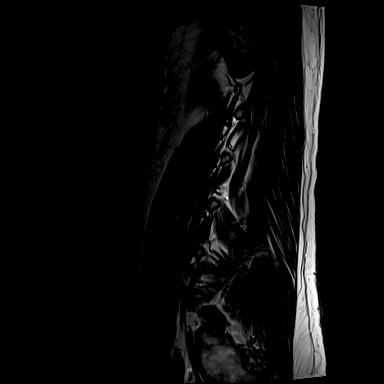

[Series 13: T2 · axial · 4.0mm · 0.28mm/px · z∈[-83,+85]mm · 8 of 37 slices shown (2 of 3)]
[im 1/37]
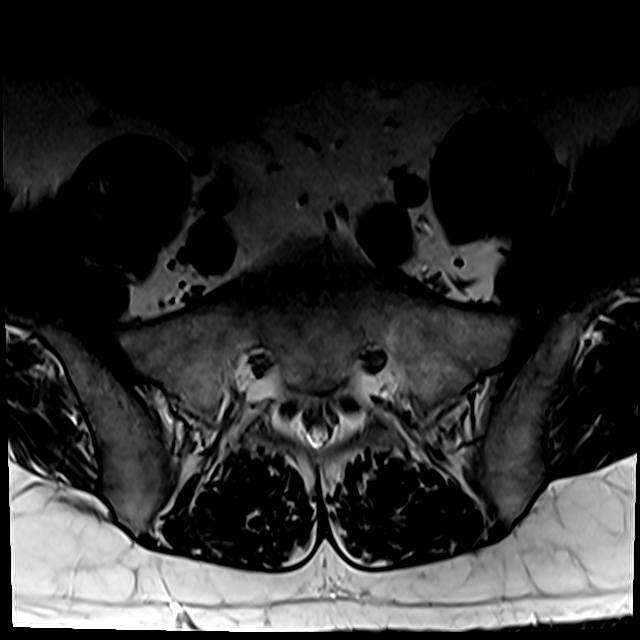
[im 7/37]
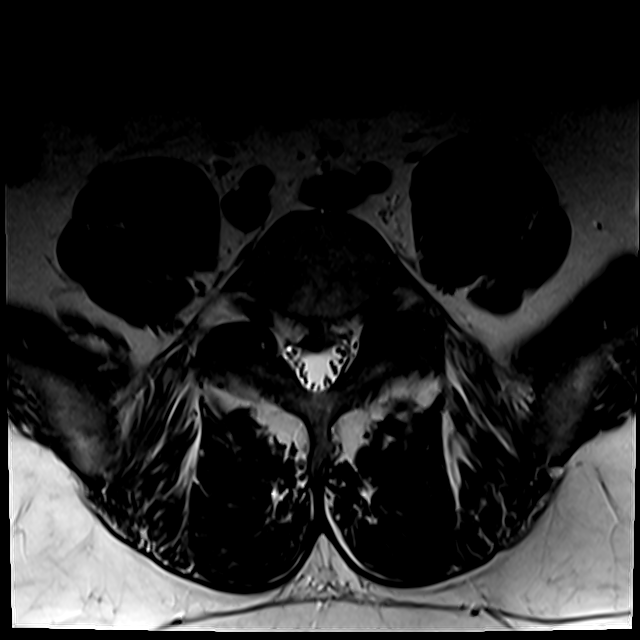
[im 13/37]
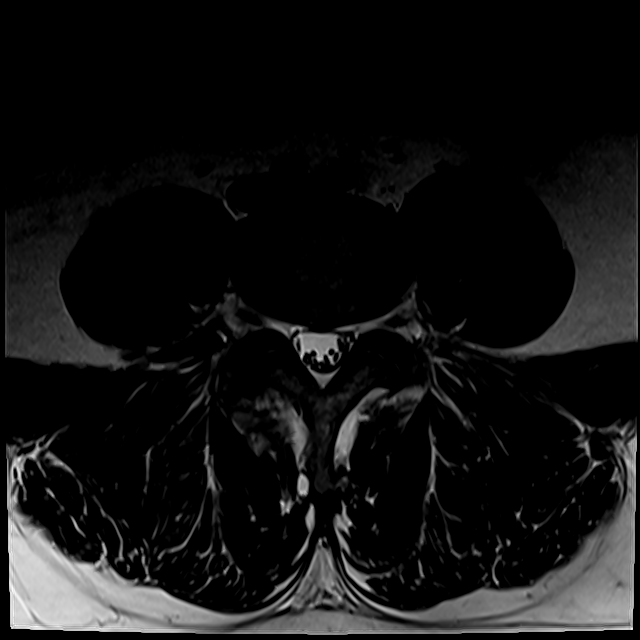
[im 16/37]
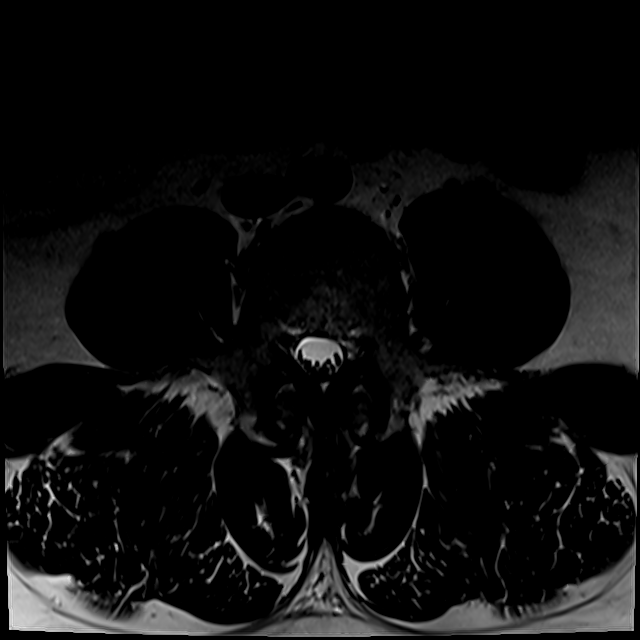
[im 19/37]
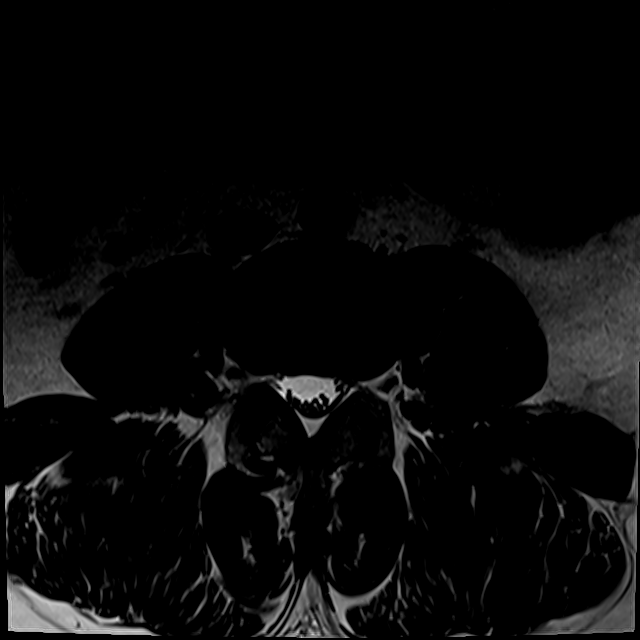
[im 22/37]
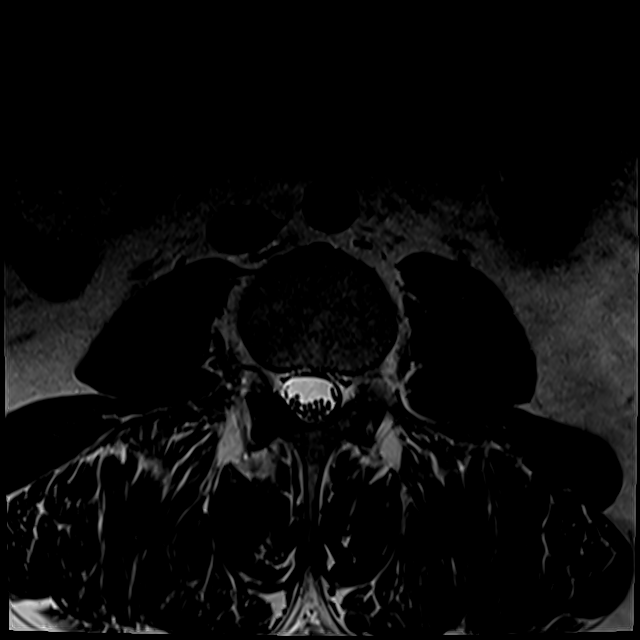
[im 25/37]
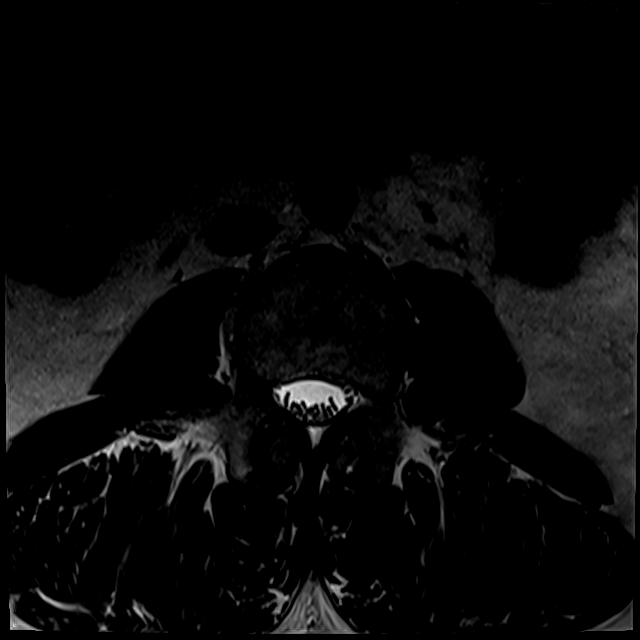
[im 31/37]
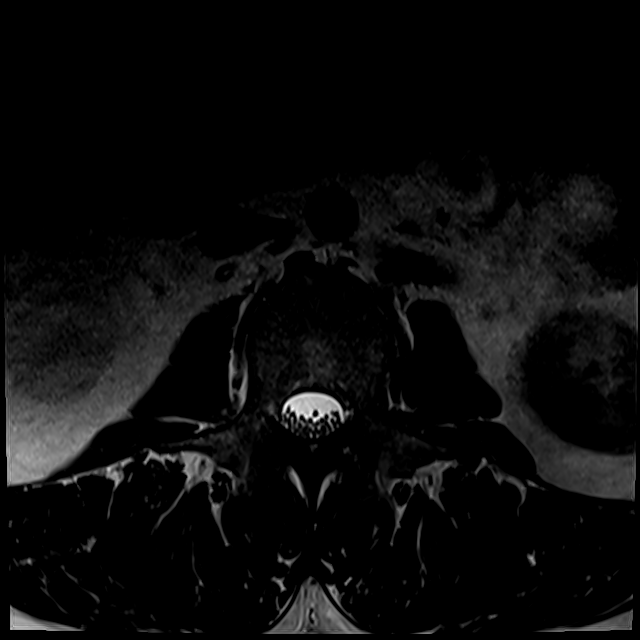

[Series 14: T2 · coronal · 5.0mm · 0.73mm/px · 3 of 14 slices shown (3 of 3)]
[im 1/14]
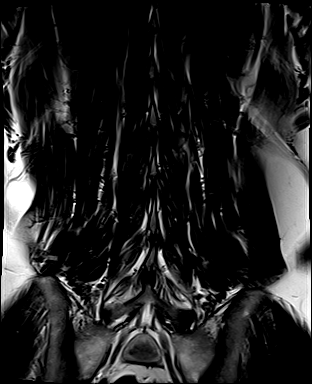
[im 7/14]
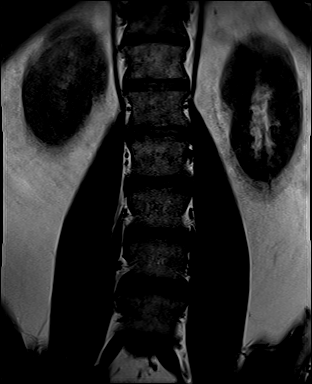
[im 14/14]
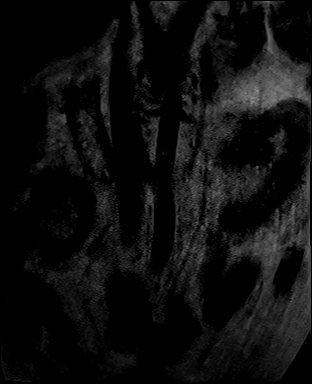

[20 of 48 positions shown; findings below may reference images not displayed]

FINDINGS: Segmentation: Standard. Lowest well-formed disc space labeled the
L5-S1 level.

Alignment: Mild levoscoliosis. Trace 2 mm degenerative
retrolisthesis of L5 on S1.

Vertebrae: Vertebral body height maintained without acute or chronic
fracture. Bone marrow signal intensity within normal limits. No
discrete or worrisome osseous lesions. Mild reactive endplate change
with marrow edema present about the L5-S1 interspace. No other
abnormal marrow edema.

Conus medullaris and cauda equina: Conus extends to the L1-2 level.
Conus and cauda equina appear normal.

Paraspinal and other soft tissues: Unremarkable.

Disc levels:

T11-12: Seen only on sagittal projection. Disc desiccation with
minimal disc bulge. Superimposed chronic endplate Schmorl's node
deformity. No significant stenosis.

T12-L1: Unremarkable.

L1-2:  Unremarkable.

L2-3: Normal interspace. Mild right-sided facet hypertrophy. No
canal or foraminal stenosis.

L3-4: Normal interspace. Mild to moderate right with mild left facet
hypertrophy. No spinal stenosis. Foramina remain patent.

L4-5: Tiny central disc protrusion with annular fissure contacts the
ventral thecal sac (series 13, image 25). Mild to moderate bilateral
facet hypertrophy. Mild epidural lipomatosis. No significant spinal
stenosis. Foramina remain patent.

L5-S1: Trace retrolisthesis. Degenerative intervertebral disc space
narrowing with disc desiccation and diffuse disc bulge. Associated
reactive endplate change with marginal endplate osteophytic
spurring. Broad-based central disc protrusion closely approximates
the descending S1 nerve roots without frank neural impingement or
displacement (series 10, image 32). Mild facet hypertrophy. No
significant canal or lateral recess stenosis. Moderate left with
mild to moderate right L5 foraminal stenosis.
IMPRESSION: 1. Broad based central disc protrusion at L5-S1, closely
approximating the descending S1 nerve roots without frank neural
impingement or displacement.
2. Moderate left with mild to moderate right L5 foraminal stenosis
related to disc bulge, reactive endplate change, and facet
hypertrophy.
3. Mild to moderate bilateral facet hypertrophy at L2-3 through
L5-S1.

## 2021-08-28 ENCOUNTER — Ambulatory Visit
Admission: RE | Admit: 2021-08-28 | Discharge: 2021-08-28 | Disposition: A | Discharge status: Home / Self Care | Payer: No Typology Code available for payment source | Source: Ambulatory Visit | Attending: Orthopaedic Surgery | Admitting: Orthopaedic Surgery

## 2021-08-28 DIAGNOSIS — M25552 Pain in left hip: Secondary | ICD-10-CM

## 2021-08-28 IMAGING — MR MR HIP*L* W/CM
4 of 6 series · 26 of 40 positions shown · IV contrast (multihance)
Comparison: None Available.

CLINICAL DATA: Left hip pain for 3 months. No known injury. Steroid
injection with no relief.

EXAM:
MRI OF THE LEFT HIP WITH CONTRAST
TECHNIQUE: Multiplanar, multisequence MR imaging was performed following the
administration of intravenous contrast.
CONTRAST:  15mL MULTIHANCE GADOBENATE DIMEGLUMINE 529 MG/ML IV SOLN

[Series 8: T2 fat-sat · coronal · left · 3.0mm · 0.78mm/px · 7 of 30 slices shown (1 of 2)]
[im 1/30]
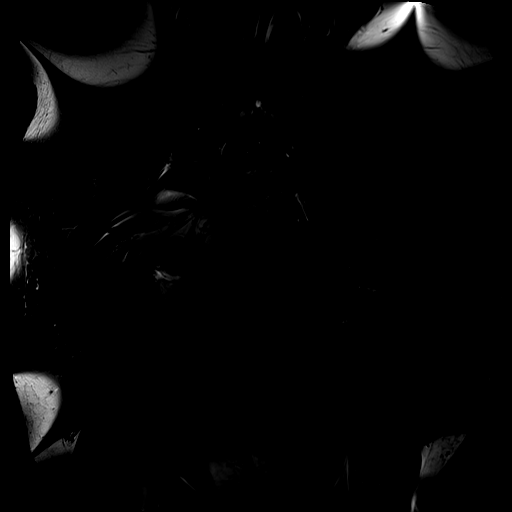
[im 5/30]
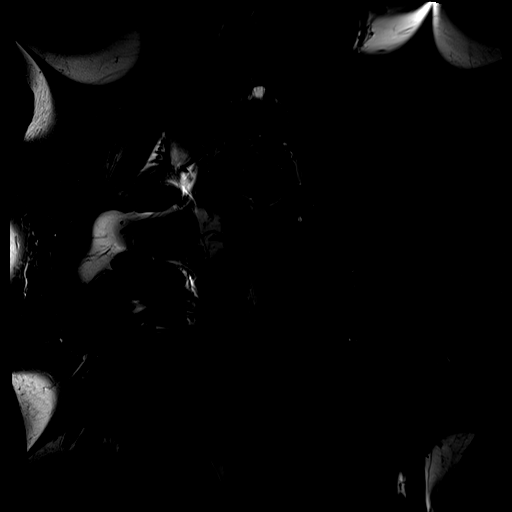
[im 10/30]
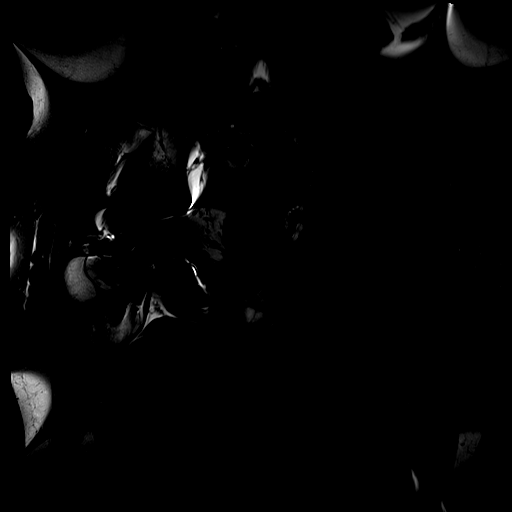
[im 15/30]
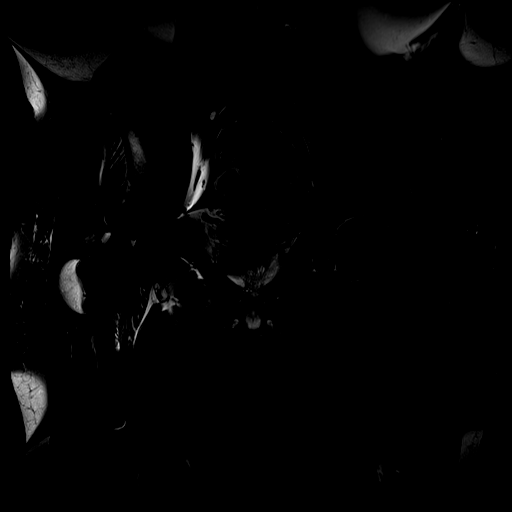
[im 20/30]
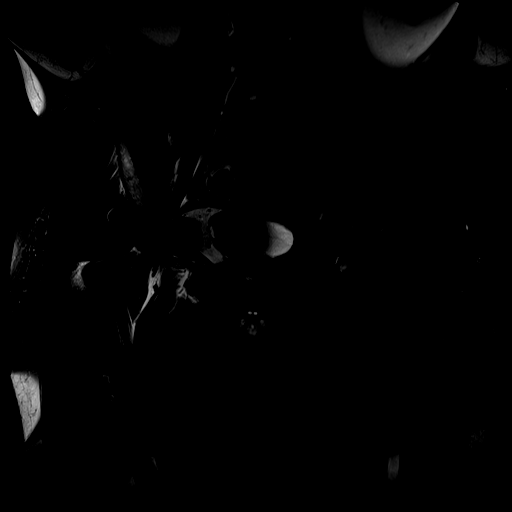
[im 25/30]
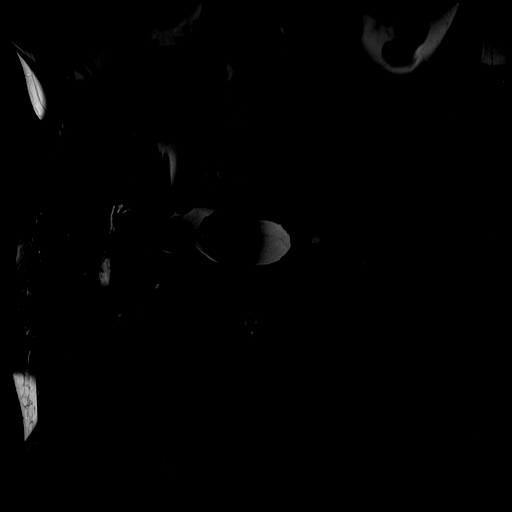
[im 30/30]
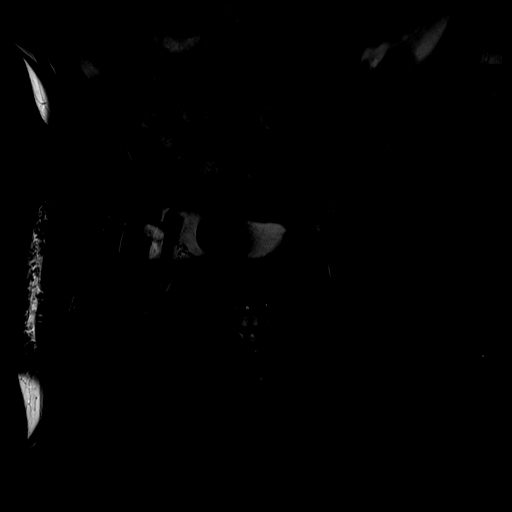

[Series 9: T1 · coronal · left · 3.0mm · 0.78mm/px · 6 of 30 slices shown]
[im 1/30]
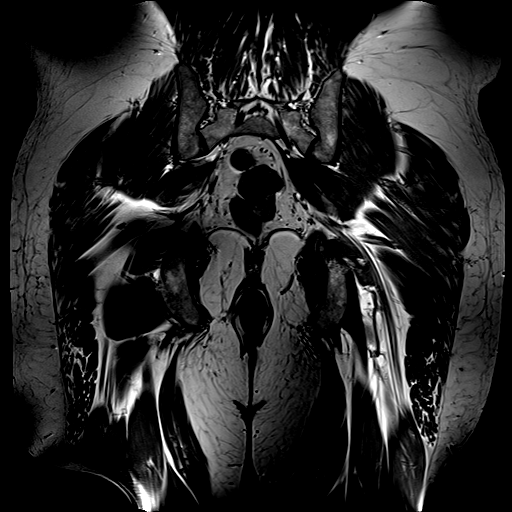
[im 6/30]
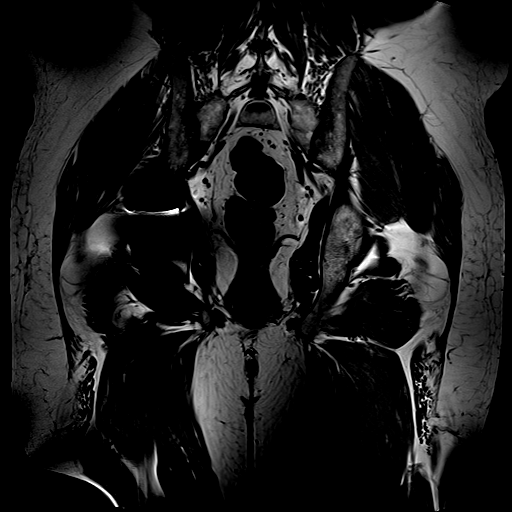
[im 12/30]
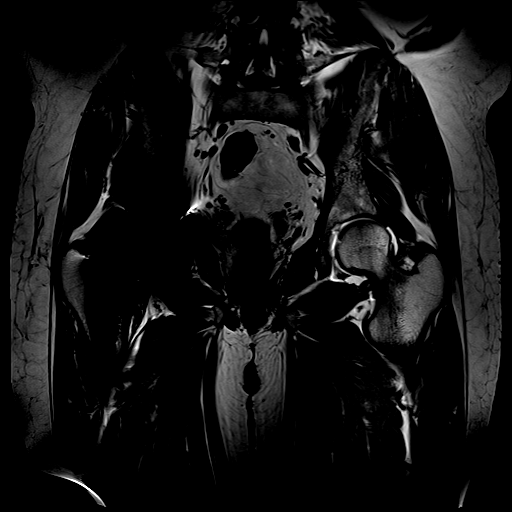
[im 18/30]
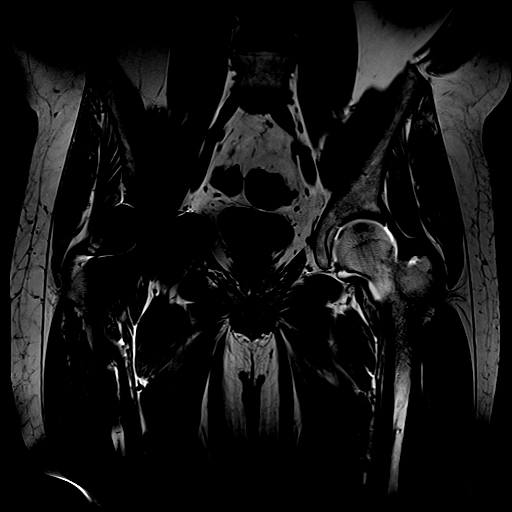
[im 24/30]
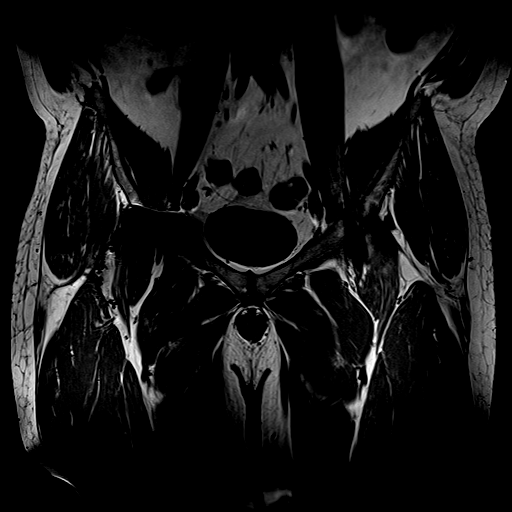
[im 30/30]
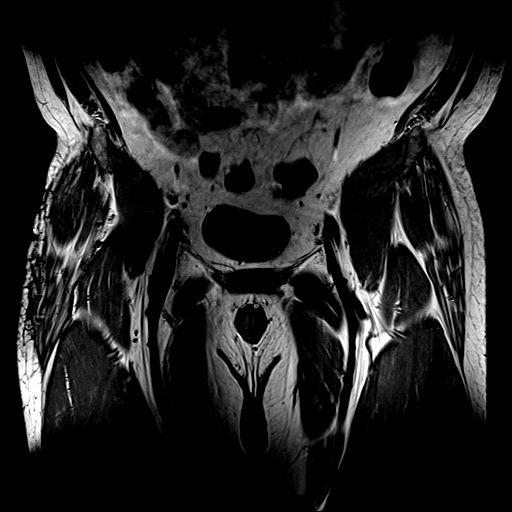

[Series 10: T2 fat-sat · axial · left · 3.0mm · 1.19mm/px · z∈[-67,+48]mm · 7 of 33 slices shown (2 of 2)]
[im 1/33]
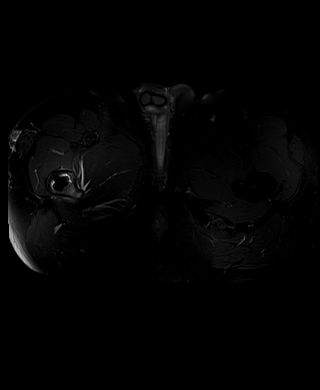
[im 6/33]
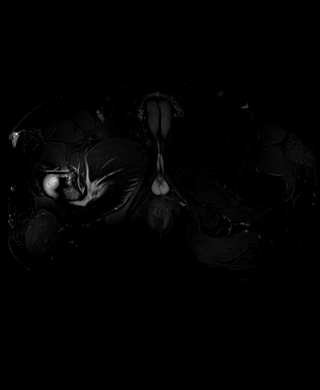
[im 11/33]
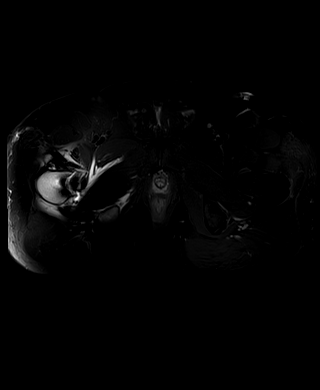
[im 17/33]
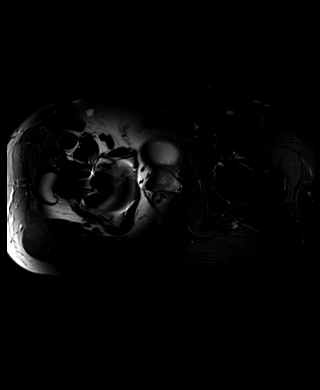
[im 22/33]
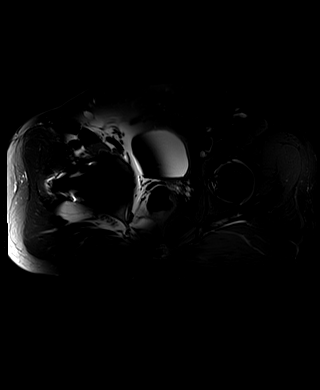
[im 27/33]
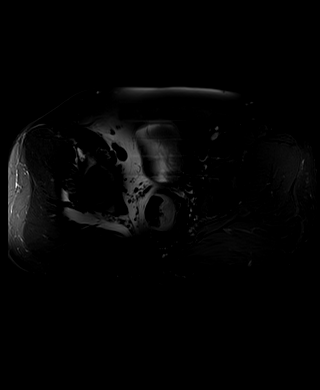
[im 33/33]
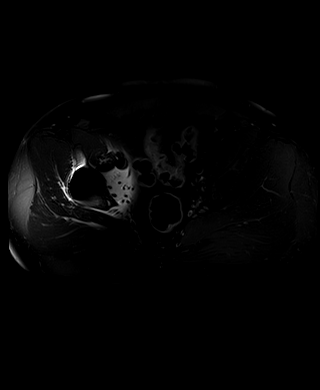

[Series 11: T1 fat-sat · oblique · left · 3.0mm · 0.56mm/px · 6 of 34 slices shown]
[im 1/34]
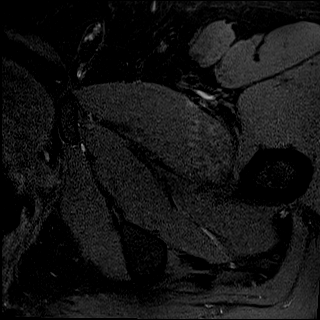
[im 6/34]
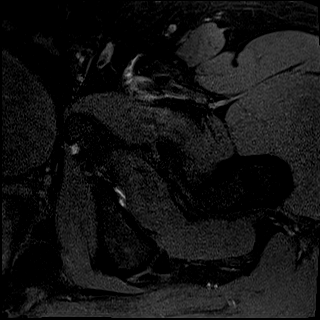
[im 12/34]
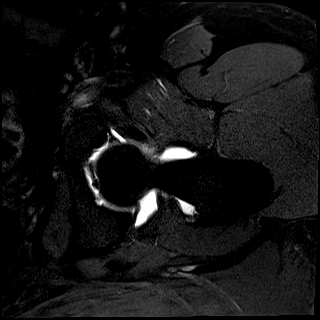
[im 17/34]
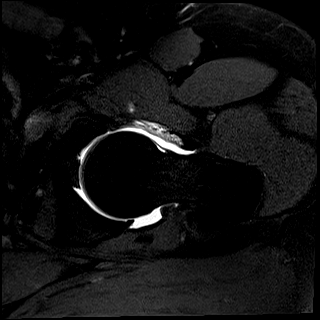
[im 23/34]
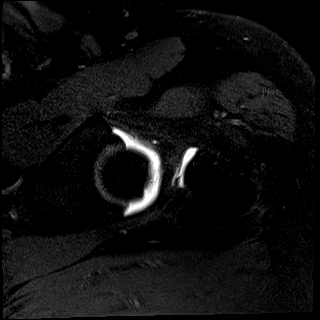
[im 28/34]
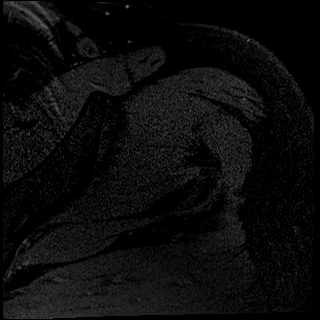

[26 of 40 positions shown; findings below may reference images not displayed]

FINDINGS: Bone

No left hip fracture, dislocation or avascular necrosis. No
aggressive osseous lesion. Status post right hip arthroplasty.

SI joints are normal. No SI joint widening or erosive changes.

Alignment

Normal. No subluxation.

Dysplasia

None.

Joint effusion

Intraarticular contrast distends the left hip joint capsule.
Otherwise, no joint effusions.

Labrum

Mild generalized degenerative changes without discrete tear.

Cartilage

Articular cartilage thinning prominent anterosuperiorly without
evidence of full-thickness defect or subchondral cystic changes.

Capsule and ligaments

Normal.

Muscles and Tendons

Flexors: Normal.

Extensors: Normal.

Abductors: Normal.

Adductors: Normal.

Rotators: Normal.

Hamstrings: Normal.

Other Findings

No bursal fluid.

Viscera

No abnormality seen in pelvis. No lymphadenopathy. No free fluid in
the pelvis.
IMPRESSION: 1.  Mild degenerative changes of the labrum without discrete tear.

2. Articular cartilage thinning prominent anterosuperiorly without
evidence of full-thickness defect.

3.  No evidence of fracture or osteonecrosis.

4.  Muscles and tendons are unremarkable.

## 2021-08-28 MED ORDER — GADOBENATE DIMEGLUMINE 529 MG/ML IV SOLN
15.0000 mL | Freq: Once | INTRAVENOUS | Status: AC | PRN
  Administered 2021-08-28: 15 mL via INTRAVENOUS

## 2021-08-28 MED ORDER — IOPAMIDOL (ISOVUE-M 200) INJECTION 41%
15.0000 mL | Freq: Once | INTRAMUSCULAR | Status: AC
  Administered 2021-08-28: 15 mL via INTRA_ARTICULAR

## 2021-08-29 ENCOUNTER — Other Ambulatory Visit: Payer: Self-pay | Admitting: Adult Health

## 2021-08-29 ENCOUNTER — Other Ambulatory Visit (HOSPITAL_COMMUNITY): Payer: Self-pay

## 2021-08-29 MED ORDER — LIDOCAINE 5 % EX PTCH
1.0000 | MEDICATED_PATCH | CUTANEOUS | 3 refills | Status: DC
Start: 2021-08-29 — End: 2021-11-14
  Filled 2021-08-29: qty 30, 30d supply, fill #0
  Filled 2021-10-07: qty 30, 30d supply, fill #1

## 2021-08-30 ENCOUNTER — Ambulatory Visit (INDEPENDENT_AMBULATORY_CARE_PROVIDER_SITE_OTHER): Payer: No Typology Code available for payment source | Admitting: Orthopaedic Surgery

## 2021-08-30 ENCOUNTER — Other Ambulatory Visit (HOSPITAL_COMMUNITY): Payer: Self-pay

## 2021-08-30 ENCOUNTER — Encounter: Payer: Self-pay | Admitting: Orthopaedic Surgery

## 2021-08-30 DIAGNOSIS — M545 Low back pain, unspecified: Secondary | ICD-10-CM | POA: Diagnosis not present

## 2021-08-30 NOTE — Progress Notes (Signed)
Office Visit Note   Patient: Shane Cole.           Date of Birth: 1980-08-18           MRN: [id number] Visit Date: 08/30/2021              Requested by: Shirline Frees, NP [address] [place],  [place] [address] PCP: Shirline Frees, NP   Assessment & Plan: Visit Diagnoses:  1. Low back pain, unspecified back pain laterality, unspecified chronicity, unspecified whether sciatica present     Plan: Sukhraj returns today to discuss lumbar spine MRI.  Examination is unchanged.  Lumbar spine MRI shows disc protrusion at L5-S1 and moderate left L5 foraminal stenosis.  Based on these findings I will make a referral to Dr. Alvester Morin for [facility].  Follow-up as needed.  Follow-Up Instructions: No follow-ups on file.   Orders:  Orders Placed This Encounter  Procedures   Ambulatory referral to Physical Medicine Rehab   No orders of the defined types were placed in this encounter.     Procedures: No procedures performed   Clinical Data: No additional findings.   Subjective: Chief Complaint  Patient presents with   Lower Back - Follow-up    MRI review    HPI  Review of Systems   Objective: Vital Signs: There were no vitals taken for this visit.  Physical Exam  Ortho Exam  Specialty Comments:  No specialty comments available.  Imaging: No results found.   PMFS History: Patient Active Problem List   Diagnosis Date Noted   Pain of left hip 08/09/2021   Raynaud phenomenon 05/08/2021   Family history of avascular necrosis of hip (Right) 05/08/2021   History of total hip replacement (Right) 12/03/2020   Pharmacologic therapy 07/30/2020   Disorder of skeletal system 07/30/2020   Problems influencing health status 07/30/2020   Avascular necrosis of hip (Right) (HCC) 07/30/2020   Chronic hip pain (Right) 07/30/2020   Chronic low back pain (2ry area of Pain) (Bilateral) w/o sciatica 07/30/2020   Chronic shoulder pain (3ry area of Pain) (Right) 07/30/2020    Auricular cyst 01/21/2018   Laryngopharyngeal reflux (LPR) 01/21/2018   GERD (gastroesophageal reflux disease) 07/16/2016   Internal hemorrhoid 07/16/2016   Abdominal pain, chronic, epigastric 07/16/2016   Rectal bleeding 07/16/2016   Essential hypertension 02/26/2016   Adhesive capsulitis of right shoulder 07/16/2015   Thoracic outlet syndrome 02/12/2015   Chronic narcotic use 10/26/2014   Opioid contract exists 10/26/2014   Smoker 05/11/2014   Coccygeal pain, acute 05/18/2012   Chronic pain syndrome 05/18/2012   Chronic diarrhea 03/29/2012   Bipolar disorder (HCC) 03/29/2012   Chronic foot pain (1ry area of Pain) (Left) 06/11/2011   Past Medical History:  Diagnosis Date   Allergy    Anxiety    Bipolar disorder (HCC)    Bronchitis    Bronchitis    Crush injury lower leg    Left lower leg   Depression    Gastric ulcer    GERD (gastroesophageal reflux disease)    will awaken him in night   History of hiatal hernia    Hypertension    Migraine    Nerve pain    RSD (reflex sympathetic dystrophy)     Family History  Problem Relation Age of Onset   Hyperlipidemia Father    Hypertension Father    Anxiety disorder Father    Drug abuse Father    Cancer Paternal Grandfather  lung, colon   Colon cancer Paternal Grandfather    Lung cancer Paternal Grandfather    Diabetes Paternal Grandmother    Cancer Paternal Grandmother    Cancer Maternal Grandmother    Cancer Maternal Grandfather    Lung cancer Maternal Grandfather    Stroke Other    Heart disease Other    Depression Paternal Aunt    Anxiety disorder Paternal Aunt    Drug abuse Paternal Uncle    Colon cancer Paternal Uncle    Esophageal cancer Neg Hx    Stomach cancer Neg Hx    Rectal cancer Neg Hx     Past Surgical History:  Procedure Laterality Date   CHOLECYSTECTOMY N/A 01/24/2014   Procedure: LAPAROSCOPIC CHOLECYSTECTOMY;  Surgeon: Axel Filler, MD;  Location: [facility];  Service: General;   Laterality: N/A;   COLONOSCOPY     HAND SURGERY     MASS EXCISION Left 01/25/2013   Procedure: EXCISION MASS DORSAL ASPECT LEFT LONG FINGER DISTAL INTERPHALANGEAL JOINT;  Surgeon: Wyn Forster., MD;  Location: [facility];  Service: Orthopedics;  Laterality: Left;  Left long    MOUTH SURGERY     TOTAL HIP ARTHROPLASTY Right 12/03/2020   Procedure: TOTAL HIP ARTHROPLASTY ANTERIOR APPROACH;  Surgeon: Tarry Kos, MD;  Location: [facility];  Service: Orthopedics;  Laterality: Right;  3-C   Social History   Occupational History   Occupation: Disabled    Comment: Previously worked Web designer  Tobacco Use   Smoking status: Every Day    Packs/day: 0.25    Years: 23.00    Total pack years: 5.75    Types: Cigarettes   Smokeless tobacco: Never   Tobacco comments:    Using nicotine patch  Vaping Use   Vaping Use: Never used  Substance and Sexual Activity   Alcohol use: Yes    Alcohol/week: 6.0 standard drinks of alcohol    Types: 6 Cans of beer per week    Comment: occasional   Drug use: No   Sexual activity: Yes    Partners: Female

## 2021-09-02 ENCOUNTER — Other Ambulatory Visit (HOSPITAL_COMMUNITY): Payer: Self-pay

## 2021-09-03 ENCOUNTER — Other Ambulatory Visit (HOSPITAL_COMMUNITY): Payer: Self-pay

## 2021-09-23 ENCOUNTER — Other Ambulatory Visit (HOSPITAL_COMMUNITY): Payer: Self-pay

## 2021-10-02 NOTE — Progress Notes (Signed)
Lab orders placed.  

## 2021-10-07 ENCOUNTER — Other Ambulatory Visit: Payer: Self-pay | Admitting: Adult Health

## 2021-10-07 ENCOUNTER — Other Ambulatory Visit (HOSPITAL_COMMUNITY): Payer: Self-pay

## 2021-10-07 DIAGNOSIS — G894 Chronic pain syndrome: Secondary | ICD-10-CM

## 2021-10-08 ENCOUNTER — Other Ambulatory Visit (HOSPITAL_COMMUNITY): Payer: Self-pay

## 2021-10-08 MED ORDER — TIZANIDINE HCL 4 MG PO TABS
4.0000 mg | ORAL_TABLET | Freq: Two times a day (BID) | ORAL | 0 refills | Status: DC
  Filled 2021-10-08: qty 180, 90d supply, fill #0

## 2021-10-22 ENCOUNTER — Other Ambulatory Visit (HOSPITAL_COMMUNITY): Payer: Self-pay

## 2021-10-22 ENCOUNTER — Other Ambulatory Visit: Payer: Self-pay | Admitting: Adult Health

## 2021-10-22 DIAGNOSIS — I4510 Unspecified right bundle-branch block: Secondary | ICD-10-CM

## 2021-10-22 DIAGNOSIS — R030 Elevated blood-pressure reading, without diagnosis of hypertension: Secondary | ICD-10-CM

## 2021-10-22 NOTE — Telephone Encounter (Signed)
Pt  called to request a refill of:  oxyCODONE (ROXICODONE) 5 MG immediate release tablet  Last OV:  08/20/21  Pt's usual pharmacy is out, so Pt is asking that the refill be sent to:  [facility]  [address], [place]

## 2021-10-23 ENCOUNTER — Other Ambulatory Visit (HOSPITAL_COMMUNITY): Payer: Self-pay

## 2021-10-23 MED ORDER — METOPROLOL SUCCINATE ER 25 MG PO TB24
25.0000 mg | ORAL_TABLET | Freq: Every day | ORAL | 1 refills | Status: DC
  Filled 2021-10-23: qty 90, 90d supply, fill #0
  Filled 2022-03-26: qty 90, 90d supply, fill #1

## 2021-10-31 ENCOUNTER — Ambulatory Visit (INDEPENDENT_AMBULATORY_CARE_PROVIDER_SITE_OTHER): Payer: Self-pay | Admitting: Adult Health

## 2021-10-31 DIAGNOSIS — Z91199 Patient's noncompliance with other medical treatment and regimen due to unspecified reason: Secondary | ICD-10-CM

## 2021-10-31 NOTE — Progress Notes (Deleted)
Subjective:    Patient ID: Shane Cole., male    DOB: 17-Nov-1980, 41 y.o.   MRN: [id number]  HPI 41 year old male who  has a past medical history of Allergy, Anxiety, Bipolar disorder ([place]), Bronchitis, Bronchitis, Crush injury lower leg, Depression, Gastric ulcer, GERD (gastroesophageal reflux disease), History of hiatal hernia, Hypertension, Migraine, Nerve pain, and RSD (reflex sympathetic dystrophy).  NCCSRS reviewed in EPIC   Indication for chronic opioid: Chronic arthritic pain of multiple joints, Complex regional pain syndrome of lower extremity Medication and dose: Oxycodone 5 mg 4 times daily, gabapentin 600 mg 3 times daily, nortriptyline 75 mg nightly, Motrin 800 mg 3 times daily, Tylenol 1000 mg every 6 hours as needed, and tizanidine 4 mg twice daily as needed # pills per month: 120 tabs of oxycodone 5 mg monthly.  Last UDS date: 08/20/2021 Opioid Treatment Agreement signed: 02/14/2022 Opioid Treatment Agreement last reviewed with patient: 10/31/2021 NCCSRS reviewed this encounter (include red flags):  Reviewed with no red flags  Review of Systems See HPI   Past Medical History:  Diagnosis Date   Allergy    Anxiety    Bipolar disorder ([place])    Bronchitis    Bronchitis    Crush injury lower leg    Left lower leg   Depression    Gastric ulcer    GERD (gastroesophageal reflux disease)    will awaken him in night   History of hiatal hernia    Hypertension    Migraine    Nerve pain    RSD (reflex sympathetic dystrophy)     Social History   Socioeconomic History   Marital status: Married    Spouse name: Not on file   Number of children: 1   Years of education: 10th grade   Highest education level: Not on file  Occupational History   Occupation: Disabled    Comment: Previously worked Medical sales representative  Tobacco Use   Smoking status: Every Day    Packs/day: 0.25    Years: 23.00    Total pack years: 5.75    Types: Cigarettes    Smokeless tobacco: Never   Tobacco comments:    Using nicotine patch  Vaping Use   Vaping Use: Never used  Substance and Sexual Activity   Alcohol use: Yes    Alcohol/week: 6.0 standard drinks of alcohol    Types: 6 Cans of beer per week    Comment: occasional   Drug use: No   Sexual activity: Yes    Partners: Female  Other Topics Concern   Not on file  Social History Narrative   05/24/12  Clive was born and grew up in [place], [place].    He has 2 sisters.    He reports that his childhood was "lousy."    He completed the 10th grade.    He has been married for 5 years, and is currently separated for 3 weeks.    He has a daughter who is 78-1/2 years old.    He has been unemployed for 2 years, and is disabled due to an on-the-job work accident.    He is currently living with his aunt and cousin.    He reports that his hobbies are sports and dogs.    He reports that he is spiritual, but not religious.    He states that his aunt and his father are his social support system.    He denies any current legal problems,  but got a DUI in 2007. 05/24/12 [facility]      Social Determinants of Health   Financial Resource Strain: Not on file  Food Insecurity: Not on file  Transportation Needs: Not on file  Physical Activity: Not on file  Stress: Stress Concern Present (02/18/2021)   [facility]    Feeling of Stress : To some extent  Social Connections: Unknown (02/18/2021)   Social Connection and Isolation Panel [NHANES]    Frequency of Communication with Friends and Family: Once a week    Frequency of Social Gatherings with Friends and Family: Once a week    Attends Religious Services: Not on Advertising copywriter or Organizations: No    Attends Archivist Meetings: Not on file    Marital Status: Married  Human resources officer Violence: Not on file    Past Surgical History:  Procedure Laterality Date    CHOLECYSTECTOMY N/A 01/24/2014   Procedure: LAPAROSCOPIC CHOLECYSTECTOMY;  Surgeon: Ralene Ok, MD;  Location: [facility];  Service: General;  Laterality: N/A;   COLONOSCOPY     HAND SURGERY     MASS EXCISION Left 01/25/2013   Procedure: EXCISION MASS DORSAL ASPECT LEFT LONG FINGER DISTAL INTERPHALANGEAL JOINT;  Surgeon: Cammie Sickle., MD;  Location: [facility];  Service: Orthopedics;  Laterality: Left;  Left long    MOUTH SURGERY     TOTAL HIP ARTHROPLASTY Right 12/03/2020   Procedure: TOTAL HIP ARTHROPLASTY ANTERIOR APPROACH;  Surgeon: Leandrew Koyanagi, MD;  Location: [facility];  Service: Orthopedics;  Laterality: Right;  3-C    Family History  Problem Relation Age of Onset   Hyperlipidemia Father    Hypertension Father    Anxiety disorder Father    Drug abuse Father    Cancer Paternal Grandfather        lung, colon   Colon cancer Paternal Grandfather    Lung cancer Paternal Grandfather    Diabetes Paternal Grandmother    Cancer Paternal Grandmother    Cancer Maternal Grandmother    Cancer Maternal Grandfather    Lung cancer Maternal Grandfather    Stroke Other    Heart disease Other    Depression Paternal Aunt    Anxiety disorder Paternal Aunt    Drug abuse Paternal Uncle    Colon cancer Paternal Uncle    Esophageal cancer Neg Hx    Stomach cancer Neg Hx    Rectal cancer Neg Hx     Allergies  Allergen Reactions   Aspirin Shortness Of Breath and Tinitus    Tinnitus, dizzy, short of breath   Codeine Itching    Reaction to tylenol #3   Penicillins Anaphylaxis and Other (See Comments)    Was told never to take medication.  Has patient had a PCN reaction causing immediate rash, facial/tongue/throat swelling, SOB or lightheadedness with hypotension: no Has patient had a PCN reaction causing severe rash involving mucus membranes or skin necrosis: no Has patient had a PCN reaction that required hospitalization: no Has patient had a PCN reaction occurring within  the last 10 years: no If all of the above answers are "NO", then may proceed with Cephalosporin use.    Tramadol     seizure    Amitriptyline Rash    Current Outpatient Medications on File Prior to Visit  Medication Sig Dispense Refill   COVID-19 At Home Antigen Test (CARESTART COVID-19 HOME TEST) KIT Use as directed  4 each 0   diphenhydrAMINE (BENADRYL) 25 MG tablet Take 25 mg by mouth every 6 (six) hours as needed for allergies.     docusate sodium (COLACE) 100 MG capsule Take 1 capsule (100 mg total) by mouth daily as needed. To be taken after surgery 30 capsule 2   famotidine (PEPCID) 20 MG tablet Take 20 mg by mouth daily as needed for heartburn.     gabapentin (NEURONTIN) 300 MG capsule Take 1 capsule (300 mg total) by mouth 3 (three) times daily. 270 capsule 0   ibuprofen (ADVIL) 800 MG tablet Take 1 tablet by mouth three times daily as needed. 90 tablet 6   lidocaine (LIDODERM) 5 % Place 1 patch onto the skin daily. Remove & Discard patch within 12 hours or as directed by MD 30 patch 3   lidocaine (XYLOCAINE) 5 % ointment Apply topically as needed for moderate pain. 50 g 2   Lidocaine 3 % CREA Apply 1 application topically daily as needed (pain).     metoprolol succinate (TOPROL-XL) 25 MG 24 hr tablet Take 1 tablet (25 mg total) by mouth daily. 90 tablet 1   Multiple Vitamin (MULTIVITAMIN WITH MINERALS) TABS tablet Take 1 tablet by mouth daily.     Nerve Stimulator (TENS THERAPY PAIN RELIEF) DEVI 1 application by Does not apply route daily as needed (chronic pain).     nicotine (NICODERM CQ - DOSED IN MG/24 HOURS) 21 mg/24hr patch Place onto the skin as directed 28 patch 0   nicotine polacrilex (NICORETTE) 4 MG gum USE AS DIRECTED 110 each 0   nortriptyline (PAMELOR) 75 MG capsule Take 1 capsule (75 mg total) by mouth at bedtime. 90 capsule 0   ondansetron (ZOFRAN ODT) 4 MG disintegrating tablet Dissolve 1 tablet (4 mg total) by mouth every 8 (eight) hours as needed for nausea or  vomiting. 20 tablet 2   oxyCODONE (ROXICODONE) 5 MG immediate release tablet Take 1 tablet (5 mg total) by mouth every 4 (four) hours as needed for severe pain. 120 tablet 0   oxyCODONE (ROXICODONE) 5 MG immediate release tablet Take 1 tablet by mouth every 4 hours as needed for severe pain. 120 tablet 0   pantoprazole (PROTONIX) 40 MG tablet Take 1 tablet twice daily 30 to 60 minutes before breakfast and dinner. 60 tablet 5   tiZANidine (ZANAFLEX) 4 MG tablet Take 1 tablet by mouth 2 times daily. 180 tablet 0   No current facility-administered medications on file prior to visit.    There were no vitals taken for this visit.      Objective:   Physical Exam Vitals and nursing note reviewed.  Constitutional:      Appearance: Normal appearance.  Cardiovascular:     Rate and Rhythm: Normal rate and regular rhythm.     Pulses: Normal pulses.     Heart sounds: Normal heart sounds.  Musculoskeletal:        General: Tenderness and deformity present.  Skin:    General: Skin is warm and dry.  Neurological:     General: No focal deficit present.     Mental Status: He is alert and oriented to person, place, and time.     Gait: Gait abnormal (limping gait).  Psychiatric:        Mood and Affect: Mood normal.        Behavior: Behavior normal.        Thought Content: Thought content normal.        Judgment:  Judgment normal.           Assessment & Plan:

## 2021-10-31 NOTE — Patient Instructions (Signed)
Health Maintenance Due  Topic Date Due   COVID-19 Vaccine (2 - [place] risk series) 07/23/2019   INFLUENZA VACCINE  10/22/2021      Row Labels 05/03/2021    3:59 PM 07/30/2020    1:20 PM 07/03/2020    1:38 PM  Depression screen PHQ 2/9   Section Header. No data exists in this row.     Decreased Interest   1 0 0  Down, Depressed, Hopeless   2 0 0  PHQ - 2 Score   3 0 0  Altered sleeping   2  1  Tired, decreased energy   1  0  Change in appetite   0  0  Feeling bad or failure about yourself    0  1  Trouble concentrating   1  0  Moving slowly or fidgety/restless   0  0  Suicidal thoughts   0  0  PHQ-9 Score   7  2  Difficult doing work/chores   Not difficult at all  Not difficult at all

## 2021-11-01 NOTE — Progress Notes (Signed)
Patient was a no show for his appointment

## 2021-11-07 ENCOUNTER — Ambulatory Visit: Payer: No Typology Code available for payment source | Admitting: Adult Health

## 2021-11-14 ENCOUNTER — Other Ambulatory Visit: Payer: Self-pay | Admitting: Adult Health

## 2021-11-14 ENCOUNTER — Encounter: Payer: Self-pay | Admitting: Adult Health

## 2021-11-14 ENCOUNTER — Ambulatory Visit (INDEPENDENT_AMBULATORY_CARE_PROVIDER_SITE_OTHER): Payer: No Typology Code available for payment source | Admitting: Adult Health

## 2021-11-14 ENCOUNTER — Other Ambulatory Visit (HOSPITAL_COMMUNITY): Payer: Self-pay

## 2021-11-14 VITALS — BP 130/64 | HR 91 | Temp 98.6°F | Ht 71.0 in | Wt 227.0 lb

## 2021-11-14 DIAGNOSIS — M130 Polyarthritis, unspecified: Secondary | ICD-10-CM

## 2021-11-14 DIAGNOSIS — R52 Pain, unspecified: Secondary | ICD-10-CM

## 2021-11-14 DIAGNOSIS — G894 Chronic pain syndrome: Secondary | ICD-10-CM

## 2021-11-14 DIAGNOSIS — G90529 Complex regional pain syndrome I of unspecified lower limb: Secondary | ICD-10-CM

## 2021-11-14 MED ORDER — NORTRIPTYLINE HCL 75 MG PO CAPS
75.0000 mg | ORAL_CAPSULE | Freq: Every day | ORAL | 0 refills | Status: DC
  Filled 2021-11-14: qty 90, 90d supply, fill #0

## 2021-11-14 MED ORDER — GABAPENTIN 300 MG PO CAPS
300.0000 mg | ORAL_CAPSULE | Freq: Three times a day (TID) | ORAL | 0 refills | Status: DC
  Filled 2021-11-14: qty 270, 90d supply, fill #0

## 2021-11-14 MED ORDER — LIDOCAINE 5 % EX PTCH
2.0000 | MEDICATED_PATCH | CUTANEOUS | 3 refills | Status: DC
  Filled 2021-11-14: qty 60, 30d supply, fill #0
  Filled 2021-12-26: qty 60, 30d supply, fill #1
  Filled 2022-03-26: qty 60, 30d supply, fill #2
  Filled 2022-06-12: qty 60, 30d supply, fill #3

## 2021-11-14 NOTE — Telephone Encounter (Signed)
Please advise 

## 2021-11-14 NOTE — Progress Notes (Signed)
Subjective:    Patient ID: Shane Landry., male    DOB: 07-13-1980, 41 y.o.   MRN: [id number]  HPI  NCCSRS reviewed in EPIC   Indication for chronic opioid: Chronic arthritis pain of multiple joints Medication and dose: C codon 5 mg 4 times daily, gabapentin 600 mg 3 times daily, nortriptyline 75 mg nightly, Motrin 800 mg 3 times daily as needed, Tylenol 1000 mg every 6 hours as needed, and tizanidine 4 mg twice daily as needed # pills per month: 120 tabs of oxycodone 5 mg per month  Last UDS date: 11/14/2021 Opioid Treatment Agreement signed: 02/14/2022 Opioid Treatment Agreement last reviewed with patient: 11/14/2021 NCCSRS reviewed this encounter (include red flags):  reviewed with no red flags.    Review of Systems See HPI   Past Medical History:  Diagnosis Date   Allergy    Anxiety    Bipolar disorder (HCC)    Bronchitis    Bronchitis    Crush injury lower leg    Left lower leg   Depression    Gastric ulcer    GERD (gastroesophageal reflux disease)    will awaken him in night   History of hiatal hernia    Hypertension    Migraine    Nerve pain    RSD (reflex sympathetic dystrophy)     Social History   Socioeconomic History   Marital status: Married    Spouse name: Not on file   Number of children: 1   Years of education: 10th grade   Highest education level: Not on file  Occupational History   Occupation: Disabled    Comment: Previously worked Web designer  Tobacco Use   Smoking status: Every Day    Packs/day: 0.25    Years: 23.00    Total pack years: 5.75    Types: Cigarettes   Smokeless tobacco: Never   Tobacco comments:    Using nicotine patch  Vaping Use   Vaping Use: Never used  Substance and Sexual Activity   Alcohol use: Yes    Alcohol/week: 6.0 standard drinks of alcohol    Types: 6 Cans of beer per week    Comment: occasional   Drug use: No   Sexual activity: Yes    Partners: Female  Other Topics Concern    Not on file  Social History Narrative   05/24/12  Shane Cole was born and grew up in [place], [place].    He has 2 sisters.    He reports that his childhood was "lousy."    He completed the 10th grade.    He has been married for 5 years, and is currently separated for 3 weeks.    He has a daughter who is 66-1/2 years old.    He has been unemployed for 2 years, and is disabled due to an on-the-job work accident.    He is currently living with his aunt and cousin.    He reports that his hobbies are sports and dogs.    He reports that he is spiritual, but not religious.    He states that his aunt and his father are his social support system.    He denies any current legal problems, but got a DUI in 2007. 05/24/12 AHW      Social Determinants of Health   Financial Resource Strain: Not on file  Food Insecurity: Not on file  Transportation Needs: Not on file  Physical Activity: Not on file  Stress: Stress Concern Present (02/18/2021)   [employer] of Occupational Health - Occupational Stress Questionnaire    Feeling of Stress : To some extent  Social Connections: Unknown (02/18/2021)   Social Connection and Isolation Panel [NHANES]    Frequency of Communication with Friends and Family: Once a week    Frequency of Social Gatherings with Friends and Family: Once a week    Attends Religious Services: Not on Marketing executive or Organizations: No    Attends Banker Meetings: Not on file    Marital Status: Married  Catering manager Violence: Not on file    Past Surgical History:  Procedure Laterality Date   CHOLECYSTECTOMY N/A 01/24/2014   Procedure: LAPAROSCOPIC CHOLECYSTECTOMY;  Surgeon: Axel Filler, MD;  Location: [facility];  Service: General;  Laterality: N/A;   COLONOSCOPY     HAND SURGERY     MASS EXCISION Left 01/25/2013   Procedure: EXCISION MASS DORSAL ASPECT LEFT LONG FINGER DISTAL INTERPHALANGEAL JOINT;  Surgeon: Wyn Forster., MD;  Location:  [facility];  Service: Orthopedics;  Laterality: Left;  Left long    MOUTH SURGERY     TOTAL HIP ARTHROPLASTY Right 12/03/2020   Procedure: TOTAL HIP ARTHROPLASTY ANTERIOR APPROACH;  Surgeon: Tarry Kos, MD;  Location: [facility];  Service: Orthopedics;  Laterality: Right;  3-C    Family History  Problem Relation Age of Onset   Hyperlipidemia Father    Hypertension Father    Anxiety disorder Father    Drug abuse Father    Cancer Paternal Grandfather        lung, colon   Colon cancer Paternal Grandfather    Lung cancer Paternal Grandfather    Diabetes Paternal Grandmother    Cancer Paternal Grandmother    Cancer Maternal Grandmother    Cancer Maternal Grandfather    Lung cancer Maternal Grandfather    Stroke Other    Heart disease Other    Depression Paternal Aunt    Anxiety disorder Paternal Aunt    Drug abuse Paternal Uncle    Colon cancer Paternal Uncle    Esophageal cancer Neg Hx    Stomach cancer Neg Hx    Rectal cancer Neg Hx     Allergies  Allergen Reactions   Aspirin Shortness Of Breath and Tinitus    Tinnitus, dizzy, short of breath   Codeine Itching    Reaction to tylenol #3   Penicillins Anaphylaxis and Other (See Comments)    Was told never to take medication.  Has patient had a PCN reaction causing immediate rash, facial/tongue/throat swelling, SOB or lightheadedness with hypotension: no Has patient had a PCN reaction causing severe rash involving mucus membranes or skin necrosis: no Has patient had a PCN reaction that required hospitalization: no Has patient had a PCN reaction occurring within the last 10 years: no If all of the above answers are "NO", then may proceed with Cephalosporin use.    Tramadol     seizure    Amitriptyline Rash    Current Outpatient Medications on File Prior to Visit  Medication Sig Dispense Refill   diphenhydrAMINE (BENADRYL) 25 MG tablet Take 25 mg by mouth every 6 (six) hours as needed for allergies.      docusate sodium (COLACE) 100 MG capsule Take 1 capsule (100 mg total) by mouth daily as needed. To be taken after surgery 30 capsule 2   famotidine (PEPCID) 20 MG tablet Take 20 mg  by mouth daily as needed for heartburn.     gabapentin (NEURONTIN) 300 MG capsule Take 1 capsule (300 mg total) by mouth 3 (three) times daily. 270 capsule 0   ibuprofen (ADVIL) 800 MG tablet Take 1 tablet by mouth three times daily as needed. 90 tablet 6   lidocaine (LIDODERM) 5 % Place 1 patch onto the skin daily. Remove & Discard patch within 12 hours or as directed by MD 30 patch 3   lidocaine (XYLOCAINE) 5 % ointment Apply topically as needed for moderate pain. 50 g 2   Lidocaine 3 % CREA Apply 1 application topically daily as needed (pain).     metoprolol succinate (TOPROL-XL) 25 MG 24 hr tablet Take 1 tablet (25 mg total) by mouth daily. 90 tablet 1   Multiple Vitamin (MULTIVITAMIN WITH MINERALS) TABS tablet Take 1 tablet by mouth daily.     Nerve Stimulator (TENS THERAPY PAIN RELIEF) DEVI 1 application by Does not apply route daily as needed (chronic pain).     nicotine (NICODERM CQ - DOSED IN MG/24 HOURS) 21 mg/24hr patch Place onto the skin as directed 28 patch 0   nicotine polacrilex (NICORETTE) 4 MG gum USE AS DIRECTED 110 each 0   nortriptyline (PAMELOR) 75 MG capsule Take 1 capsule (75 mg total) by mouth at bedtime. 90 capsule 0   ondansetron (ZOFRAN ODT) 4 MG disintegrating tablet Dissolve 1 tablet (4 mg total) by mouth every 8 (eight) hours as needed for nausea or vomiting. 20 tablet 2   oxyCODONE (ROXICODONE) 5 MG immediate release tablet Take 1 tablet (5 mg total) by mouth every 4 (four) hours as needed for severe pain. 120 tablet 0   oxyCODONE (ROXICODONE) 5 MG immediate release tablet Take 1 tablet by mouth every 4 hours as needed for severe pain. 120 tablet 0   pantoprazole (PROTONIX) 40 MG tablet Take 1 tablet twice daily 30 to 60 minutes before breakfast and dinner. 60 tablet 5   tiZANidine  (ZANAFLEX) 4 MG tablet Take 1 tablet by mouth 2 times daily. 180 tablet 0   No current facility-administered medications on file prior to visit.    BP 130/64   Pulse 91   Temp 98.6 F (37 C) (Oral)   Ht 5\' 11"  (1.803 m)   Wt 227 lb (103 kg)   SpO2 98%   BMI 31.66 kg/m       Objective:   Physical Exam Vitals and nursing note reviewed.  Constitutional:      Appearance: Normal appearance.  Cardiovascular:     Rate and Rhythm: Normal rate and regular rhythm.     Pulses: Normal pulses.     Heart sounds: Normal heart sounds.  Pulmonary:     Effort: Pulmonary effort is normal.     Breath sounds: Normal breath sounds.  Musculoskeletal:        General: Tenderness and deformity present. Normal range of motion.  Skin:    General: Skin is warm and dry.  Neurological:     General: No focal deficit present.     Mental Status: He is alert and oriented to person, place, and time.  Psychiatric:        Mood and Affect: Mood normal.        Behavior: Behavior normal.        Thought Content: Thought content normal.        Judgment: Judgment normal.       Assessment & Plan:  1. Pain management  -  DRUG MONITOR, PANEL 1, W/CONF, URINE; Future  2. Chronic pain syndrome  - DRUG MONITOR, PANEL 1, W/CONF, URINE; Future  3. Complex regional pain syndrome type 1 of lower extremity, unspecified laterality  - DRUG MONITOR, PANEL 1, W/CONF, URINE; Future  4. Polyarthritis  - DRUG MONITOR, PANEL 1, W/CONF, URINE; Future  Dorothyann Peng, NP

## 2021-11-15 ENCOUNTER — Other Ambulatory Visit (HOSPITAL_COMMUNITY): Payer: Self-pay

## 2021-11-17 LAB — DRUG MONITOR, PANEL 1, W/CONF, URINE
Amphetamines: NEGATIVE ng/mL (ref <500)
Barbiturates: NEGATIVE ng/mL (ref <300)
Benzodiazepines: NEGATIVE ng/mL (ref <100)
Cocaine Metabolite: NEGATIVE ng/mL (ref <150)
Codeine: NEGATIVE ng/mL (ref <50)
Creatinine: 38.5 mg/dL (ref >=20.0)
Hydrocodone: NEGATIVE ng/mL (ref <50)
Hydromorphone: NEGATIVE ng/mL (ref <50)
Marijuana Metabolite: NEGATIVE ng/mL (ref <20)
Methadone Metabolite: NEGATIVE ng/mL (ref <100)
Morphine: NEGATIVE ng/mL (ref <50)
Norhydrocodone: NEGATIVE ng/mL (ref <50)
Noroxycodone: 1666 ng/mL — ABNORMAL HIGH (ref <50)
Opiates: NEGATIVE ng/mL (ref <100)
Oxidant: NEGATIVE ug/mL (ref <200)
Oxycodone: 985 ng/mL — ABNORMAL HIGH (ref <50)
Oxycodone: POSITIVE ng/mL — AB (ref <100)
Oxymorphone: 297 ng/mL — ABNORMAL HIGH (ref <50)
Phencyclidine: NEGATIVE ng/mL (ref <25)
pH: 6.4 (ref 4.5–9.0)

## 2021-11-17 LAB — DM TEMPLATE

## 2021-11-18 ENCOUNTER — Other Ambulatory Visit: Payer: Self-pay | Admitting: Adult Health

## 2021-11-18 DIAGNOSIS — G894 Chronic pain syndrome: Secondary | ICD-10-CM

## 2021-11-18 DIAGNOSIS — R52 Pain, unspecified: Secondary | ICD-10-CM

## 2021-11-18 DIAGNOSIS — G90529 Complex regional pain syndrome I of unspecified lower limb: Secondary | ICD-10-CM

## 2021-11-18 DIAGNOSIS — M130 Polyarthritis, unspecified: Secondary | ICD-10-CM

## 2021-11-19 ENCOUNTER — Other Ambulatory Visit (HOSPITAL_COMMUNITY): Payer: Self-pay

## 2021-11-19 ENCOUNTER — Other Ambulatory Visit: Payer: Self-pay | Admitting: Adult Health

## 2021-11-19 DIAGNOSIS — M130 Polyarthritis, unspecified: Secondary | ICD-10-CM

## 2021-11-19 DIAGNOSIS — G90529 Complex regional pain syndrome I of unspecified lower limb: Secondary | ICD-10-CM

## 2021-11-19 DIAGNOSIS — G894 Chronic pain syndrome: Secondary | ICD-10-CM

## 2021-11-19 DIAGNOSIS — R52 Pain, unspecified: Secondary | ICD-10-CM

## 2021-11-19 MED ORDER — OXYCODONE HCL 5 MG PO TABS
5.0000 mg | ORAL_TABLET | ORAL | 0 refills | Status: DC | PRN
  Filled 2021-11-19 – 2021-12-06 (×3): qty 120, 20d supply, fill #0

## 2021-11-19 MED ORDER — OXYCODONE HCL 5 MG PO TABS
5.0000 mg | ORAL_TABLET | ORAL | 0 refills | Status: DC | PRN
  Filled 2021-11-19 (×2): qty 120, 20d supply, fill #0

## 2021-11-19 MED ORDER — OXYCODONE HCL 5 MG PO TABS
5.0000 mg | ORAL_TABLET | ORAL | 0 refills | Status: DC | PRN
  Filled 2021-11-19 – 2021-12-26 (×2): qty 120, 20d supply, fill #0

## 2021-11-28 ENCOUNTER — Other Ambulatory Visit (HOSPITAL_COMMUNITY): Payer: Self-pay

## 2021-11-29 ENCOUNTER — Other Ambulatory Visit (HOSPITAL_COMMUNITY): Payer: Self-pay

## 2021-12-03 ENCOUNTER — Other Ambulatory Visit (HOSPITAL_COMMUNITY): Payer: Self-pay

## 2021-12-04 ENCOUNTER — Other Ambulatory Visit (HOSPITAL_COMMUNITY): Payer: Self-pay

## 2021-12-06 ENCOUNTER — Other Ambulatory Visit (HOSPITAL_COMMUNITY): Payer: Self-pay

## 2021-12-27 ENCOUNTER — Other Ambulatory Visit (HOSPITAL_BASED_OUTPATIENT_CLINIC_OR_DEPARTMENT_OTHER): Payer: Self-pay

## 2021-12-27 ENCOUNTER — Other Ambulatory Visit (HOSPITAL_COMMUNITY): Payer: Self-pay

## 2022-02-05 ENCOUNTER — Encounter: Payer: Self-pay | Admitting: Adult Health

## 2022-02-06 ENCOUNTER — Other Ambulatory Visit (HOSPITAL_COMMUNITY): Payer: Self-pay

## 2022-02-06 ENCOUNTER — Other Ambulatory Visit: Payer: Self-pay | Admitting: Adult Health

## 2022-02-06 MED ORDER — OXYCODONE HCL 5 MG PO TABS
5.0000 mg | ORAL_TABLET | Freq: Three times a day (TID) | ORAL | 0 refills | Status: AC
  Filled 2022-02-06: qty 30, 10d supply, fill #0

## 2022-02-11 ENCOUNTER — Encounter: Payer: Self-pay | Admitting: Adult Health

## 2022-02-11 ENCOUNTER — Ambulatory Visit (INDEPENDENT_AMBULATORY_CARE_PROVIDER_SITE_OTHER): Payer: No Typology Code available for payment source | Admitting: Adult Health

## 2022-02-11 ENCOUNTER — Other Ambulatory Visit (HOSPITAL_COMMUNITY): Payer: Self-pay

## 2022-02-11 VITALS — BP 100/68 | HR 100 | Temp 98.1°F | Ht 71.0 in | Wt 228.0 lb

## 2022-02-11 DIAGNOSIS — G90529 Complex regional pain syndrome I of unspecified lower limb: Secondary | ICD-10-CM | POA: Diagnosis not present

## 2022-02-11 DIAGNOSIS — R52 Pain, unspecified: Secondary | ICD-10-CM | POA: Diagnosis not present

## 2022-02-11 DIAGNOSIS — Z23 Encounter for immunization: Secondary | ICD-10-CM

## 2022-02-11 DIAGNOSIS — M130 Polyarthritis, unspecified: Secondary | ICD-10-CM

## 2022-02-11 DIAGNOSIS — G894 Chronic pain syndrome: Secondary | ICD-10-CM | POA: Diagnosis not present

## 2022-02-11 MED ORDER — GABAPENTIN 300 MG PO CAPS
300.0000 mg | ORAL_CAPSULE | Freq: Three times a day (TID) | ORAL | 0 refills | Status: DC
  Filled 2022-02-11: qty 270, 90d supply, fill #0

## 2022-02-11 MED ORDER — NORTRIPTYLINE HCL 75 MG PO CAPS
75.0000 mg | ORAL_CAPSULE | Freq: Every day | ORAL | 0 refills | Status: DC
  Filled 2022-02-11: qty 90, 90d supply, fill #0

## 2022-02-11 NOTE — Progress Notes (Signed)
Subjective:    Patient ID: Shane Cole., male    DOB: 1980/12/18, 41 y.o.   MRN: [id number]  HPI NCCSRS reviewed in EPIC   Indication for chronic opioid: Chronic arthritic pain of multiple joints  Medication and dose: Hydrocodone 5 mg QID, gabapentin 600 mg TID, Nortriptyline 75 mg QHS, Motrin 800 mg TID and Tylenol 1000 mg Q6H PRN # pills per month: 120 tabs of oxycodone 5 mg per month  Last UDS date: 11/14/2021 Opioid Treatment Agreement signed: Yes, 02/11/2022 Opioid Treatment Agreement last reviewed with patient: 11/21/223 NCCSRS reviewed this encounter (include red flags):  Reviewed and no red flags.   He has decreased his Roxicodone to 5 mg TID. He reports that he is doing well with the decrease. He would like to continue to try and decrease his dosing.   Review of Systems See HPI   Past Medical History:  Diagnosis Date   Allergy    Anxiety    Bipolar disorder (HCC)    Bronchitis    Bronchitis    Crush injury lower leg    Left lower leg   Depression    Gastric ulcer    GERD (gastroesophageal reflux disease)    will awaken him in night   History of hiatal hernia    Hypertension    Migraine    Nerve pain    RSD (reflex sympathetic dystrophy)     Social History   Socioeconomic History   Marital status: Married    Spouse name: Not on file   Number of children: 1   Years of education: 10th grade   Highest education level: Not on file  Occupational History   Occupation: Disabled    Comment: Previously worked Web designer  Tobacco Use   Smoking status: Every Day    Packs/day: 0.25    Years: 23.00    Total pack years: 5.75    Types: Cigarettes   Smokeless tobacco: Never   Tobacco comments:    Using nicotine patch  Vaping Use   Vaping Use: Never used  Substance and Sexual Activity   Alcohol use: Yes    Alcohol/week: 6.0 standard drinks of alcohol    Types: 6 Cans of beer per week    Comment: occasional   Drug use: No    Sexual activity: Yes    Partners: Female  Other Topics Concern   Not on file  Social History Narrative   05/24/12  Kapena was born and grew up in [place], [place].    He has 2 sisters.    He reports that his childhood was "lousy."    He completed the 10th grade.    He has been married for 5 years, and is currently separated for 3 weeks.    He has a daughter who is 79-1/2 years old.    He has been unemployed for 2 years, and is disabled due to an on-the-job work accident.    He is currently living with his aunt and cousin.    He reports that his hobbies are sports and dogs.    He reports that he is spiritual, but not religious.    He states that his aunt and his father are his social support system.    He denies any current legal problems, but got a DUI in 2007. 05/24/12 AHW      Social Determinants of Health   Financial Resource Strain: Not on file  Food Insecurity: Not on file  Transportation Needs: Not on file  Physical Activity: Not on file  Stress: Stress Concern Present (02/18/2021)   [employer]ational [employer] - Occupational Stress Questionnaire    Feeling of Stress : To some extent  Social Connections: Unknown (02/18/2021)   Social Connection and Isolation Panel [NHANES]    Frequency of Communication with Friends and Family: Once a week    Frequency of Social Gatherings with Friends and Family: Once a week    Attends Religious Services: Not on Marketing executivefile    Active Member of Clubs or Organizations: No    Attends BankerClub or Organization Meetings: Not on file    Marital Status: Married  Catering managerntimate Partner Violence: Not on file    Past Surgical History:  Procedure Laterality Date   CHOLECYSTECTOMY N/A 01/24/2014   Procedure: LAPAROSCOPIC CHOLECYSTECTOMY;  Surgeon: Axel FillerArmando Ramirez, MD;  Location: [facility];  Service: General;  Laterality: N/A;   COLONOSCOPY     HAND SURGERY     MASS EXCISION Left 01/25/2013   Procedure: EXCISION MASS DORSAL ASPECT LEFT LONG FINGER DISTAL  INTERPHALANGEAL JOINT;  Surgeon: Wyn Forsterobert V Sypher Jr., MD;  Location: [facility];  Service: Orthopedics;  Laterality: Left;  Left long    MOUTH SURGERY     TOTAL HIP ARTHROPLASTY Right 12/03/2020   Procedure: TOTAL HIP ARTHROPLASTY ANTERIOR APPROACH;  Surgeon: Tarry KosXu, Naiping M, MD;  Location: [facility];  Service: Orthopedics;  Laterality: Right;  3-C    Family History  Problem Relation Age of Onset   Hyperlipidemia Father    Hypertension Father    Anxiety disorder Father    Drug abuse Father    Cancer Paternal Grandfather        lung, colon   Colon cancer Paternal Grandfather    Lung cancer Paternal Grandfather    Diabetes Paternal Grandmother    Cancer Paternal Grandmother    Cancer Maternal Grandmother    Cancer Maternal Grandfather    Lung cancer Maternal Grandfather    Stroke Other    Heart disease Other    Depression Paternal Aunt    Anxiety disorder Paternal Aunt    Drug abuse Paternal Uncle    Colon cancer Paternal Uncle    Esophageal cancer Neg Hx    Stomach cancer Neg Hx    Rectal cancer Neg Hx     Allergies  Allergen Reactions   Aspirin Shortness Of Breath and Tinitus    Tinnitus, dizzy, short of breath   Codeine Itching    Reaction to tylenol #3   Penicillins Anaphylaxis and Other (See Comments)    Was told never to take medication.  Has patient had a PCN reaction causing immediate rash, facial/tongue/throat swelling, SOB or lightheadedness with hypotension: no Has patient had a PCN reaction causing severe rash involving mucus membranes or skin necrosis: no Has patient had a PCN reaction that required hospitalization: no Has patient had a PCN reaction occurring within the last 10 years: no If all of the above answers are "NO", then may proceed with Cephalosporin use.    Tramadol     seizure    Amitriptyline Rash    Current Outpatient Medications on File Prior to Visit  Medication Sig Dispense Refill   diphenhydrAMINE (BENADRYL) 25 MG tablet Take  25 mg by mouth every 6 (six) hours as needed for allergies.     famotidine (PEPCID) 20 MG tablet Take 20 mg by mouth daily as needed for heartburn.     gabapentin (NEURONTIN) 300 MG  capsule Take 1 capsule (300 mg total) by mouth 3 (three) times daily. 270 capsule 0   ibuprofen (ADVIL) 800 MG tablet Take 1 tablet by mouth three times daily as needed. 90 tablet 6   lidocaine (LIDODERM) 5 % Place 2 patches onto the skin daily. Remove & Discard patch within 12 hours or as directed by MD 60 patch 3   lidocaine (XYLOCAINE) 5 % ointment Apply topically as needed for moderate pain. 50 g 2   Lidocaine 3 % CREA Apply 1 application topically daily as needed (pain).     metoprolol succinate (TOPROL-XL) 25 MG 24 hr tablet Take 1 tablet (25 mg total) by mouth daily. 90 tablet 1   Multiple Vitamin (MULTIVITAMIN WITH MINERALS) TABS tablet Take 1 tablet by mouth daily.     Nerve Stimulator (TENS THERAPY PAIN RELIEF) DEVI 1 application by Does not apply route daily as needed (chronic pain).     nicotine (NICODERM CQ - DOSED IN MG/24 HOURS) 21 mg/24hr patch Place onto the skin as directed 28 patch 0   nicotine polacrilex (NICORETTE) 4 MG gum USE AS DIRECTED 110 each 0   nortriptyline (PAMELOR) 75 MG capsule Take 1 capsule (75 mg total) by mouth at bedtime. 90 capsule 0   ondansetron (ZOFRAN ODT) 4 MG disintegrating tablet Dissolve 1 tablet (4 mg total) by mouth every 8 (eight) hours as needed for nausea or vomiting. 20 tablet 2   oxyCODONE (ROXICODONE) 5 MG immediate release tablet Take 1 tablet (5 mg total) by mouth every 8 (eight) hours for 10 days. 30 tablet 0   pantoprazole (PROTONIX) 40 MG tablet Take 1 tablet twice daily 30 to 60 minutes before breakfast and dinner. 60 tablet 5   oxyCODONE (ROXICODONE) 5 MG immediate release tablet Take 1 tablet (5 mg total) by mouth every 4 (four) hours as needed for severe pain. 120 tablet 0   oxyCODONE (ROXICODONE) 5 MG immediate release tablet Take 1 tablet by mouth every 4  hours as needed for severe pain. 120 tablet 0   oxyCODONE (ROXICODONE) 5 MG immediate release tablet Take 1 tablet (5 mg total) by mouth every 4 (four) hours as needed for severe pain. 120 tablet 0   No current facility-administered medications on file prior to visit.    BP 100/68   Pulse 100   Temp 98.1 F (36.7 C) (Oral)   Ht 5\' 11"  (1.803 m)   Wt 228 lb (103.4 kg)   SpO2 97%   BMI 31.80 kg/m       Objective:   Physical Exam Vitals and nursing note reviewed.  Constitutional:      Appearance: Normal appearance.  Cardiovascular:     Rate and Rhythm: Normal rate and regular rhythm.     Pulses: Normal pulses.     Heart sounds: Normal heart sounds.  Pulmonary:     Effort: Pulmonary effort is normal.     Breath sounds: Normal breath sounds.  Musculoskeletal:        General: Normal range of motion.  Skin:    General: Skin is warm and dry.     Capillary Refill: Capillary refill takes less than 2 seconds.  Neurological:     General: No focal deficit present.     Mental Status: He is alert and oriented to person, place, and time.  Psychiatric:        Mood and Affect: Mood normal.        Behavior: Behavior normal.  Thought Content: Thought content normal.        Judgment: Judgment normal.       Assessment & Plan:  1. Chronic pain syndrome - Will switch to Percocet 5- 325 - He would like to month to month on his prescriptions to see if he can decrease his dosing more.  - DRUG MONITOR, PANEL 1, W/CONF, URINE - gabapentin (NEURONTIN) 300 MG capsule; Take 1 capsule (300 mg total) by mouth 3 (three) times daily.  Dispense: 270 capsule; Refill: 0 - nortriptyline (PAMELOR) 75 MG capsule; Take 1 capsule (75 mg total) by mouth at bedtime.  Dispense: 90 capsule; Refill: 0  2. Pain management  - DRUG MONITOR, PANEL 1, W/CONF, URINE - gabapentin (NEURONTIN) 300 MG capsule; Take 1 capsule (300 mg total) by mouth 3 (three) times daily.  Dispense: 270 capsule; Refill: 0 -  nortriptyline (PAMELOR) 75 MG capsule; Take 1 capsule (75 mg total) by mouth at bedtime.  Dispense: 90 capsule; Refill: 0  3. Complex regional pain syndrome type 1 of lower extremity, unspecified laterality  - DRUG MONITOR, PANEL 1, W/CONF, URINE - gabapentin (NEURONTIN) 300 MG capsule; Take 1 capsule (300 mg total) by mouth 3 (three) times daily.  Dispense: 270 capsule; Refill: 0 - nortriptyline (PAMELOR) 75 MG capsule; Take 1 capsule (75 mg total) by mouth at bedtime.  Dispense: 90 capsule; Refill: 0  4. Polyarthritis  - DRUG MONITOR, PANEL 1, W/CONF, URINE - gabapentin (NEURONTIN) 300 MG capsule; Take 1 capsule (300 mg total) by mouth 3 (three) times daily.  Dispense: 270 capsule; Refill: 0 - nortriptyline (PAMELOR) 75 MG capsule; Take 1 capsule (75 mg total) by mouth at bedtime.  Dispense: 90 capsule; Refill: 0  Shirline Frees, NP  Time spent with patient today was 31 minutes which consisted of chart review, discussing diagnosis, work up, treatment answering questions and documentation.

## 2022-02-11 NOTE — Patient Instructions (Signed)
It was great seeing you today   Please follow up in roughly 10 weeks for pain management exam.

## 2022-02-15 LAB — DRUG MONITOR, PANEL 1, W/CONF, URINE
Amphetamines: NEGATIVE ng/mL (ref <500)
Barbiturates: NEGATIVE ng/mL (ref <300)
Benzodiazepines: NEGATIVE ng/mL (ref <100)
Cocaine Metabolite: NEGATIVE ng/mL (ref <150)
Codeine: NEGATIVE ng/mL (ref <50)
Creatinine: 72.4 mg/dL (ref >=20.0)
EDDP: NEGATIVE ng/mL (ref <100)
Hydrocodone: NEGATIVE ng/mL (ref <50)
Hydromorphone: NEGATIVE ng/mL (ref <50)
Marijuana Metabolite: NEGATIVE ng/mL (ref <20)
Methadone Metabolite: NEGATIVE ng/mL (ref <100)
Methadone: NEGATIVE ng/mL (ref <100)
Morphine: NEGATIVE ng/mL (ref <50)
Norhydrocodone: NEGATIVE ng/mL (ref <50)
Noroxycodone: 387 ng/mL — ABNORMAL HIGH (ref <50)
Opiates: NEGATIVE ng/mL (ref <100)
Oxidant: NEGATIVE ug/mL (ref <200)
Oxycodone: 421 ng/mL — ABNORMAL HIGH (ref <50)
Oxycodone: POSITIVE ng/mL — AB (ref <100)
Oxymorphone: 124 ng/mL — ABNORMAL HIGH (ref <50)
Phencyclidine: NEGATIVE ng/mL (ref <25)
pH: 6.2 (ref 4.5–9.0)

## 2022-02-15 LAB — DM TEMPLATE

## 2022-02-16 ENCOUNTER — Encounter: Payer: Self-pay | Admitting: Adult Health

## 2022-02-18 ENCOUNTER — Other Ambulatory Visit: Payer: Self-pay | Admitting: Adult Health

## 2022-02-18 ENCOUNTER — Other Ambulatory Visit (HOSPITAL_COMMUNITY): Payer: Self-pay

## 2022-02-18 DIAGNOSIS — R52 Pain, unspecified: Secondary | ICD-10-CM

## 2022-02-18 DIAGNOSIS — G90529 Complex regional pain syndrome I of unspecified lower limb: Secondary | ICD-10-CM

## 2022-02-18 DIAGNOSIS — M130 Polyarthritis, unspecified: Secondary | ICD-10-CM

## 2022-02-18 DIAGNOSIS — G894 Chronic pain syndrome: Secondary | ICD-10-CM

## 2022-02-18 MED ORDER — OXYCODONE-ACETAMINOPHEN 5-325 MG PO TABS
1.0000 | ORAL_TABLET | Freq: Three times a day (TID) | ORAL | 0 refills | Status: AC
  Filled 2022-02-18 – 2022-03-18 (×5): qty 90, 30d supply, fill #0

## 2022-02-18 MED ORDER — OXYCODONE-ACETAMINOPHEN 5-325 MG PO TABS
1.0000 | ORAL_TABLET | Freq: Three times a day (TID) | ORAL | 0 refills | Status: AC
  Filled 2022-02-18: qty 90, 30d supply, fill #0

## 2022-02-18 MED ORDER — OXYCODONE-ACETAMINOPHEN 5-325 MG PO TABS
1.0000 | ORAL_TABLET | Freq: Three times a day (TID) | ORAL | 0 refills | Status: DC
  Filled 2022-02-18 – 2022-04-14 (×3): qty 90, 30d supply, fill #0

## 2022-02-18 NOTE — Telephone Encounter (Signed)
Please advise 

## 2022-03-14 ENCOUNTER — Other Ambulatory Visit (HOSPITAL_COMMUNITY): Payer: Self-pay

## 2022-03-18 ENCOUNTER — Other Ambulatory Visit (HOSPITAL_COMMUNITY): Payer: Self-pay

## 2022-03-18 ENCOUNTER — Other Ambulatory Visit: Payer: Self-pay

## 2022-03-26 ENCOUNTER — Other Ambulatory Visit: Payer: Self-pay

## 2022-03-26 ENCOUNTER — Other Ambulatory Visit (HOSPITAL_COMMUNITY): Payer: Self-pay

## 2022-03-26 ENCOUNTER — Other Ambulatory Visit: Payer: Self-pay | Admitting: Adult Health

## 2022-03-26 DIAGNOSIS — G894 Chronic pain syndrome: Secondary | ICD-10-CM

## 2022-03-26 MED ORDER — TIZANIDINE HCL 4 MG PO TABS
4.0000 mg | ORAL_TABLET | Freq: Two times a day (BID) | ORAL | 0 refills | Status: DC
  Filled 2022-03-26: qty 180, 90d supply, fill #0

## 2022-03-26 MED ORDER — IBUPROFEN 800 MG PO TABS
800.0000 mg | ORAL_TABLET | Freq: Three times a day (TID) | ORAL | 6 refills | Status: DC | PRN
  Filled 2022-03-26 – 2022-03-31 (×2): qty 90, 30d supply, fill #0
  Filled 2022-06-12: qty 90, 30d supply, fill #1
  Filled 2022-07-18 (×2): qty 90, 30d supply, fill #2
  Filled 2023-02-04: qty 90, 30d supply, fill #3

## 2022-03-27 ENCOUNTER — Other Ambulatory Visit: Payer: Self-pay

## 2022-03-31 ENCOUNTER — Other Ambulatory Visit (HOSPITAL_COMMUNITY): Payer: Self-pay

## 2022-04-14 ENCOUNTER — Other Ambulatory Visit (HOSPITAL_COMMUNITY): Payer: Self-pay

## 2022-04-14 ENCOUNTER — Other Ambulatory Visit (HOSPITAL_BASED_OUTPATIENT_CLINIC_OR_DEPARTMENT_OTHER): Payer: Self-pay

## 2022-04-14 ENCOUNTER — Other Ambulatory Visit: Payer: Self-pay | Admitting: Adult Health

## 2022-04-14 DIAGNOSIS — G90529 Complex regional pain syndrome I of unspecified lower limb: Secondary | ICD-10-CM

## 2022-04-14 DIAGNOSIS — M8700 Idiopathic aseptic necrosis of unspecified bone: Secondary | ICD-10-CM

## 2022-04-15 ENCOUNTER — Other Ambulatory Visit: Payer: Self-pay

## 2022-04-15 ENCOUNTER — Other Ambulatory Visit (HOSPITAL_COMMUNITY): Payer: Self-pay

## 2022-04-15 MED ORDER — LIDOCAINE 5 % EX OINT
1.0000 | TOPICAL_OINTMENT | CUTANEOUS | 2 refills | Status: AC | PRN
  Filled 2022-04-15: qty 50, 30d supply, fill #0
  Filled 2022-05-12: qty 50, 30d supply, fill #1
  Filled 2023-02-04: qty 50, 30d supply, fill #2

## 2022-04-16 ENCOUNTER — Other Ambulatory Visit (HOSPITAL_COMMUNITY): Payer: Self-pay

## 2022-04-17 ENCOUNTER — Other Ambulatory Visit (HOSPITAL_BASED_OUTPATIENT_CLINIC_OR_DEPARTMENT_OTHER): Payer: Self-pay

## 2022-04-19 ENCOUNTER — Other Ambulatory Visit (HOSPITAL_COMMUNITY): Payer: Self-pay

## 2022-04-22 ENCOUNTER — Ambulatory Visit (INDEPENDENT_AMBULATORY_CARE_PROVIDER_SITE_OTHER): Payer: 59 | Admitting: Adult Health

## 2022-04-22 ENCOUNTER — Other Ambulatory Visit (HOSPITAL_BASED_OUTPATIENT_CLINIC_OR_DEPARTMENT_OTHER): Payer: Self-pay

## 2022-04-22 ENCOUNTER — Other Ambulatory Visit: Payer: Self-pay

## 2022-04-22 ENCOUNTER — Other Ambulatory Visit (HOSPITAL_COMMUNITY): Payer: Self-pay

## 2022-04-22 ENCOUNTER — Encounter: Payer: Self-pay | Admitting: Adult Health

## 2022-04-22 VITALS — BP 120/78 | HR 104 | Temp 98.3°F | Ht 71.0 in | Wt 227.0 lb

## 2022-04-22 DIAGNOSIS — G90529 Complex regional pain syndrome I of unspecified lower limb: Secondary | ICD-10-CM

## 2022-04-22 DIAGNOSIS — G894 Chronic pain syndrome: Secondary | ICD-10-CM

## 2022-04-22 DIAGNOSIS — M130 Polyarthritis, unspecified: Secondary | ICD-10-CM

## 2022-04-22 DIAGNOSIS — R52 Pain, unspecified: Secondary | ICD-10-CM

## 2022-04-22 DIAGNOSIS — J0141 Acute recurrent pansinusitis: Secondary | ICD-10-CM | POA: Diagnosis not present

## 2022-04-22 MED ORDER — DOXYCYCLINE HYCLATE 100 MG PO CAPS
100.0000 mg | ORAL_CAPSULE | Freq: Two times a day (BID) | ORAL | 0 refills | Status: DC
  Filled 2022-04-22 (×2): qty 14, 7d supply, fill #0

## 2022-04-22 NOTE — Progress Notes (Signed)
Subjective:    Patient ID: Shane Shove., male    DOB: [birth year], 42 y.o.   MRN: [id number]  HPI  42 year old male who  has a past medical history of Allergy, Anxiety, Bipolar disorder ([place]), Bronchitis, Bronchitis, Crush injury lower leg, Depression, Gastric ulcer, GERD (gastroesophageal reflux disease), History of hiatal hernia, Hypertension, Migraine, Nerve pain, and RSD (reflex sympathetic dystrophy).   Indication for chronic opioid: Chronic arthritic pain of multiple joints  Medication and dose:Percocet 5 mg TID , gabapentin 600 mg TID, Nortriptyline 75 mg QHS, Motrin 800 mg TID.  # pills per month: 90 tabs of percocet 5 mg monthly  Last UDS date: 02/11/2022 Opioid Treatment Agreement signed: Yes, 02/11/2022 Opioid Treatment Agreement last reviewed with patient: 11/21/223 N[facility] reviewed this encounter (include red flags):  Reviewed and no red flags.   Additionally he reports that for the last two weeks he has had a cough, sore throat, sinus pain and pressure, and low grade fever. He has been using Sudafed which helps with the symptoms periodically. His son has the same symptoms .   Review of Systems See HPI   Past Medical History:  Diagnosis Date   Allergy    Anxiety    Bipolar disorder ([place])    Bronchitis    Bronchitis    Crush injury lower leg    Left lower leg   Depression    Gastric ulcer    GERD (gastroesophageal reflux disease)    will awaken him in night   History of hiatal hernia    Hypertension    Migraine    Nerve pain    RSD (reflex sympathetic dystrophy)     Social History   Socioeconomic History   Marital status: Married    Spouse name: Not on file   Number of children: 1   Years of education: 10th grade   Highest education level: Not on file  Occupational History   Occupation: Disabled    Comment: Previously worked Medical sales representative  Tobacco Use   Smoking status: Every Day    Packs/day: 0.25    Years: 23.00    Total  pack years: 5.75    Types: Cigarettes   Smokeless tobacco: Never   Tobacco comments:    Using nicotine patch  Vaping Use   Vaping Use: Never used  Substance and Sexual Activity   Alcohol use: Yes    Alcohol/week: 6.0 standard drinks of alcohol    Types: 6 Cans of beer per week    Comment: occasional   Drug use: No   Sexual activity: Yes    Partners: Female  Other Topics Concern   Not on file  Social History Narrative   05/24/12  Jakorian was born and grew up in [place], [place].    He has 2 sisters.    He reports that his childhood was "lousy."    He completed the 10th grade.    He has been married for 5 years, and is currently separated for 3 weeks.    He has a daughter who is 20-1/2 years old.    He has been unemployed for 2 years, and is disabled due to an on-the-job work accident.    He is currently living with his aunt and cousin.    He reports that his hobbies are sports and dogs.    He reports that he is spiritual, but not religious.    He states that his aunt and his  father are his social support system.    He denies any current legal problems, but got a DUI in 2007. 05/24/12 [facility]      Social Determinants of Health   Financial Resource Strain: High Risk (04/22/2022)   Overall Financial Resource Strain (CARDIA)    Difficulty of Paying Living Expenses: Hard  Food Insecurity: Food Insecurity Present (04/22/2022)   Hunger Vital Sign    Worried About Running Out of Food in the Last Year: Sometimes true    Ran Out of Food in the Last Year: Sometimes true  Transportation Needs: No Transportation Needs (04/22/2022)   PRAPARE - Hydrologist (Medical): No    Lack of Transportation (Non-Medical): No  Physical Activity: Insufficiently Active (04/22/2022)   Exercise Vital Sign    Days of Exercise per Week: 2 days    Minutes of Exercise per Session: 40 min  Stress: Stress Concern Present (04/22/2022)   [place]    Feeling of Stress : To some extent  Social Connections: Moderately Integrated (04/22/2022)   Social Connection and Isolation Panel [NHANES]    Frequency of Communication with Friends and Family: More than three times a week    Frequency of Social Gatherings with Friends and Family: Once a week    Attends Religious Services: More than 4 times per year    Active Member of [employer] or Organizations: No    Attends Music therapist: Not on file    Marital Status: Married  Human resources officer Violence: Not on file    Past Surgical History:  Procedure Laterality Date   CHOLECYSTECTOMY N/A 01/24/2014   Procedure: LAPAROSCOPIC CHOLECYSTECTOMY;  Surgeon: Ralene Ok, MD;  Location: [facility];  Service: General;  Laterality: N/A;   COLONOSCOPY     HAND SURGERY     MASS EXCISION Left 01/25/2013   Procedure: EXCISION MASS DORSAL ASPECT LEFT LONG FINGER DISTAL INTERPHALANGEAL JOINT;  Surgeon: Cammie Sickle., MD;  Location: [facility];  Service: Orthopedics;  Laterality: Left;  Left long    MOUTH SURGERY     TOTAL HIP ARTHROPLASTY Right 12/03/2020   Procedure: TOTAL HIP ARTHROPLASTY ANTERIOR APPROACH;  Surgeon: Leandrew Koyanagi, MD;  Location: [facility];  Service: Orthopedics;  Laterality: Right;  3-C    Family History  Problem Relation Age of Onset   Hyperlipidemia Father    Hypertension Father    Anxiety disorder Father    Drug abuse Father    Cancer Paternal Grandfather        lung, colon   Colon cancer Paternal Grandfather    Lung cancer Paternal Grandfather    Diabetes Paternal Grandmother    Cancer Paternal Grandmother    Cancer Maternal Grandmother    Cancer Maternal Grandfather    Lung cancer Maternal Grandfather    Stroke Other    Heart disease Other    Depression Paternal Aunt    Anxiety disorder Paternal Aunt    Drug abuse Paternal Uncle    Colon cancer Paternal Uncle    Esophageal cancer Neg Hx    Stomach cancer Neg Hx     Rectal cancer Neg Hx     Allergies  Allergen Reactions   Aspirin Shortness Of Breath and Tinitus    Tinnitus, dizzy, short of breath   Codeine Itching    Reaction to tylenol #3   Penicillins Anaphylaxis and Other (See Comments)    Was told never  to take medication.  Has patient had a PCN reaction causing immediate rash, facial/tongue/throat swelling, SOB or lightheadedness with hypotension: no Has patient had a PCN reaction causing severe rash involving mucus membranes or skin necrosis: no Has patient had a PCN reaction that required hospitalization: no Has patient had a PCN reaction occurring within the last 10 years: no If all of the above answers are "NO", then may proceed with Cephalosporin use.    Tramadol     seizure    Amitriptyline Rash    Current Outpatient Medications on File Prior to Visit  Medication Sig Dispense Refill   diphenhydrAMINE (BENADRYL) 25 MG tablet Take 25 mg by mouth every 6 (six) hours as needed for allergies.     famotidine (PEPCID) 20 MG tablet Take 20 mg by mouth daily as needed for heartburn.     gabapentin (NEURONTIN) 300 MG capsule Take 1 capsule (300 mg total) by mouth 3 (three) times daily. 270 capsule 0   ibuprofen (ADVIL) 800 MG tablet Take 1 tablet by mouth three times daily as needed. 90 tablet 6   ibuprofen (ADVIL) 800 MG tablet Take 1 tablet (800 mg total) by mouth 3 (three) times daily as needed. 90 tablet 6   lidocaine (LIDODERM) 5 % Place 2 patches onto the skin daily. Remove & Discard patch within 12 hours or as directed by MD 60 patch 3   lidocaine (XYLOCAINE) 5 % ointment Apply topically as needed for moderate pain. 50 g 2   Lidocaine 3 % CREA Apply 1 application topically daily as needed (pain).     metoprolol succinate (TOPROL-XL) 25 MG 24 hr tablet Take 1 tablet (25 mg total) by mouth daily. 90 tablet 1   Multiple Vitamin (MULTIVITAMIN WITH MINERALS) TABS tablet Take 1 tablet by mouth daily.     Nerve Stimulator (TENS THERAPY  PAIN RELIEF) DEVI 1 application by Does not apply route daily as needed (chronic pain).     nicotine (NICODERM CQ - DOSED IN MG/24 HOURS) 21 mg/24hr patch Place onto the skin as directed 28 patch 0   nicotine polacrilex (NICORETTE) 4 MG gum USE AS DIRECTED 110 each 0   nortriptyline (PAMELOR) 75 MG capsule Take 1 capsule (75 mg total) by mouth at bedtime. 90 capsule 0   ondansetron (ZOFRAN ODT) 4 MG disintegrating tablet Dissolve 1 tablet (4 mg total) by mouth every 8 (eight) hours as needed for nausea or vomiting. 20 tablet 2   oxyCODONE-acetaminophen (PERCOCET/ROXICET) 5-325 MG tablet Take 1 tablet by mouth every 8 (eight) hours. 90 tablet 0   pantoprazole (PROTONIX) 40 MG tablet Take 1 tablet twice daily 30 to 60 minutes before breakfast and dinner. 60 tablet 5   tiZANidine (ZANAFLEX) 4 MG tablet Take 1 tablet by mouth 2 times daily. 180 tablet 0   No current facility-administered medications on file prior to visit.    BP 120/78   Pulse (!) 104   Temp 98.3 F (36.8 C) (Oral)   Ht 5\' 11"  (1.803 m)   Wt 227 lb (103 kg)   SpO2 99%   BMI 31.66 kg/m       Objective:   Physical Exam Vitals and nursing note reviewed.  Constitutional:      Appearance: Normal appearance.  HENT:     Nose: Congestion present. No rhinorrhea.     Right Turbinates: Enlarged and swollen.     Left Turbinates: Enlarged and swollen.     Right Sinus: Maxillary sinus tenderness and frontal sinus  tenderness present.     Left Sinus: Maxillary sinus tenderness and frontal sinus tenderness present.     Mouth/Throat:     Pharynx: Uvula midline. Posterior oropharyngeal erythema (mild) present. No pharyngeal swelling, oropharyngeal exudate or uvula swelling.     Tonsils: No tonsillar exudate or tonsillar abscesses.  Cardiovascular:     Rate and Rhythm: Normal rate and regular rhythm.     Pulses: Normal pulses.     Heart sounds: Normal heart sounds.  Pulmonary:     Effort: Pulmonary effort is normal.     Breath  sounds: Normal breath sounds.  Abdominal:     General: Abdomen is flat. Bowel sounds are normal.     Palpations: Abdomen is soft.  Musculoskeletal:        General: Normal range of motion.  Skin:    General: Skin is warm and dry.     Capillary Refill: Capillary refill takes less than 2 seconds.  Neurological:     General: No focal deficit present.     Mental Status: He is alert and oriented to person, place, and time.     Gait: Gait abnormal (Walking with cane).  Psychiatric:        Mood and Affect: Mood normal.        Behavior: Behavior normal.        Thought Content: Thought content normal.       Assessment & Plan:  1. Chronic pain syndrome  - DRUG MONITOR, PANEL 1, W/CONF, URINE - DRUG MONITOR, PANEL 1, W/CONF, URINE; Future  2. Pain management  - DRUG MONITOR, PANEL 1, W/CONF, URINE - DRUG MONITOR, PANEL 1, W/CONF, URINE; Future  3. Complex regional pain syndrome type 1 of lower extremity, unspecified laterality  - DRUG MONITOR, PANEL 1, W/CONF, URINE - DRUG MONITOR, PANEL 1, W/CONF, URINE; Future  4. Polyarthritis  - DRUG MONITOR, PANEL 1, W/CONF, URINE - DRUG MONITOR, PANEL 1, W/CONF, URINE; Future  5. Acute recurrent pansinusitis  - doxycycline (VIBRAMYCIN) 100 MG capsule; Take 1 capsule (100 mg total) by mouth 2 (two) times daily.  Dispense: 14 capsule; Refill: 0  Shirline Frees, NP  Time spent with patient today was 32 minutes which consisted of chart review, discussing pain management and sinusitis  work up, treatment answering questions and documentation.

## 2022-04-24 ENCOUNTER — Ambulatory Visit: Payer: Self-pay | Admitting: Adult Health

## 2022-04-25 ENCOUNTER — Other Ambulatory Visit: Payer: Self-pay | Admitting: Adult Health

## 2022-04-25 DIAGNOSIS — G894 Chronic pain syndrome: Secondary | ICD-10-CM

## 2022-04-25 DIAGNOSIS — R52 Pain, unspecified: Secondary | ICD-10-CM

## 2022-04-25 DIAGNOSIS — G90529 Complex regional pain syndrome I of unspecified lower limb: Secondary | ICD-10-CM

## 2022-04-25 DIAGNOSIS — M130 Polyarthritis, unspecified: Secondary | ICD-10-CM

## 2022-04-25 LAB — DRUG MONITOR, PANEL 1, W/CONF, URINE
Amphetamines: NEGATIVE ng/mL (ref <500)
Barbiturates: NEGATIVE ng/mL (ref <300)
Benzodiazepines: NEGATIVE ng/mL (ref <100)
Cocaine Metabolite: NEGATIVE ng/mL (ref <150)
Codeine: NEGATIVE ng/mL (ref <50)
Creatinine: 57.3 mg/dL (ref >=20.0)
EDDP: NEGATIVE ng/mL (ref <100)
Hydrocodone: NEGATIVE ng/mL (ref <50)
Hydromorphone: NEGATIVE ng/mL (ref <50)
Marijuana Metabolite: NEGATIVE ng/mL (ref <20)
Methadone Metabolite: NEGATIVE ng/mL (ref <100)
Methadone: NEGATIVE ng/mL (ref <100)
Morphine: NEGATIVE ng/mL (ref <50)
Norhydrocodone: NEGATIVE ng/mL (ref <50)
Noroxycodone: 558 ng/mL — ABNORMAL HIGH (ref <50)
Opiates: NEGATIVE ng/mL (ref <100)
Oxidant: NEGATIVE ug/mL (ref <200)
Oxycodone: 1335 ng/mL — ABNORMAL HIGH (ref <50)
Oxycodone: POSITIVE ng/mL — AB (ref <100)
Oxymorphone: 156 ng/mL — ABNORMAL HIGH (ref <50)
Phencyclidine: NEGATIVE ng/mL (ref <25)
pH: 5.9 (ref 4.5–9.0)

## 2022-04-25 LAB — DM TEMPLATE

## 2022-04-25 MED ORDER — OXYCODONE-ACETAMINOPHEN 5-325 MG PO TABS
1.0000 | ORAL_TABLET | Freq: Three times a day (TID) | ORAL | 0 refills | Status: DC
  Filled 2022-04-25 – 2022-06-09 (×2): qty 90, 30d supply, fill #0

## 2022-04-25 MED ORDER — OXYCODONE-ACETAMINOPHEN 5-325 MG PO TABS
1.0000 | ORAL_TABLET | Freq: Three times a day (TID) | ORAL | 0 refills | Status: DC
  Filled 2022-04-25 – 2022-05-12 (×3): qty 90, 30d supply, fill #0

## 2022-04-25 MED ORDER — OXYCODONE-ACETAMINOPHEN 5-325 MG PO TABS
1.0000 | ORAL_TABLET | Freq: Three times a day (TID) | ORAL | 0 refills | Status: DC
  Filled 2022-04-25 – 2022-07-09 (×2): qty 90, 30d supply, fill #0

## 2022-04-29 ENCOUNTER — Other Ambulatory Visit (HOSPITAL_COMMUNITY): Payer: Self-pay

## 2022-05-07 ENCOUNTER — Other Ambulatory Visit: Payer: Self-pay | Admitting: Adult Health

## 2022-05-07 ENCOUNTER — Other Ambulatory Visit (HOSPITAL_COMMUNITY): Payer: Self-pay

## 2022-05-07 DIAGNOSIS — G90529 Complex regional pain syndrome I of unspecified lower limb: Secondary | ICD-10-CM

## 2022-05-07 DIAGNOSIS — G894 Chronic pain syndrome: Secondary | ICD-10-CM

## 2022-05-07 DIAGNOSIS — M130 Polyarthritis, unspecified: Secondary | ICD-10-CM

## 2022-05-07 DIAGNOSIS — R52 Pain, unspecified: Secondary | ICD-10-CM

## 2022-05-08 ENCOUNTER — Other Ambulatory Visit: Payer: Self-pay

## 2022-05-08 ENCOUNTER — Other Ambulatory Visit (HOSPITAL_COMMUNITY): Payer: Self-pay

## 2022-05-08 MED ORDER — NORTRIPTYLINE HCL 75 MG PO CAPS
75.0000 mg | ORAL_CAPSULE | Freq: Every day | ORAL | 0 refills | Status: DC
  Filled 2022-05-08: qty 90, 90d supply, fill #0

## 2022-05-08 MED ORDER — GABAPENTIN 300 MG PO CAPS
300.0000 mg | ORAL_CAPSULE | Freq: Three times a day (TID) | ORAL | 0 refills | Status: DC
  Filled 2022-05-08 – 2022-05-12 (×2): qty 270, 90d supply, fill #0

## 2022-05-12 ENCOUNTER — Other Ambulatory Visit: Payer: Self-pay

## 2022-05-12 ENCOUNTER — Other Ambulatory Visit (HOSPITAL_COMMUNITY): Payer: Self-pay

## 2022-05-16 ENCOUNTER — Other Ambulatory Visit (HOSPITAL_COMMUNITY): Payer: Self-pay

## 2022-05-22 ENCOUNTER — Other Ambulatory Visit (HOSPITAL_BASED_OUTPATIENT_CLINIC_OR_DEPARTMENT_OTHER): Payer: Self-pay

## 2022-06-04 ENCOUNTER — Encounter: Payer: Self-pay | Admitting: Adult Health

## 2022-06-04 NOTE — Telephone Encounter (Signed)
Please advise 

## 2022-06-09 ENCOUNTER — Other Ambulatory Visit: Payer: Self-pay

## 2022-06-09 ENCOUNTER — Other Ambulatory Visit (HOSPITAL_COMMUNITY): Payer: Self-pay

## 2022-06-12 ENCOUNTER — Other Ambulatory Visit (HOSPITAL_COMMUNITY): Payer: Self-pay

## 2022-06-12 ENCOUNTER — Other Ambulatory Visit: Payer: Self-pay

## 2022-06-12 ENCOUNTER — Other Ambulatory Visit: Payer: Self-pay | Admitting: Adult Health

## 2022-06-12 DIAGNOSIS — G894 Chronic pain syndrome: Secondary | ICD-10-CM

## 2022-06-12 MED ORDER — TIZANIDINE HCL 4 MG PO TABS
4.0000 mg | ORAL_TABLET | Freq: Two times a day (BID) | ORAL | 0 refills | Status: AC
  Filled 2022-06-12: qty 180, 90d supply, fill #0

## 2022-06-13 ENCOUNTER — Encounter: Payer: Self-pay | Admitting: Adult Health

## 2022-06-13 ENCOUNTER — Ambulatory Visit (INDEPENDENT_AMBULATORY_CARE_PROVIDER_SITE_OTHER): Payer: 59

## 2022-06-13 ENCOUNTER — Ambulatory Visit (INDEPENDENT_AMBULATORY_CARE_PROVIDER_SITE_OTHER): Payer: 59 | Admitting: Adult Health

## 2022-06-13 VITALS — BP 108/78 | HR 77 | Temp 98.0°F | Ht 71.0 in | Wt 225.0 lb

## 2022-06-13 DIAGNOSIS — R1084 Generalized abdominal pain: Secondary | ICD-10-CM

## 2022-06-13 DIAGNOSIS — K5909 Other constipation: Secondary | ICD-10-CM | POA: Diagnosis not present

## 2022-06-13 DIAGNOSIS — R109 Unspecified abdominal pain: Secondary | ICD-10-CM | POA: Diagnosis not present

## 2022-06-13 MED ORDER — LUBIPROSTONE 24 MCG PO CAPS
24.0000 ug | ORAL_CAPSULE | Freq: Two times a day (BID) | ORAL | 0 refills | Status: DC
  Filled 2022-06-13: qty 60, 30d supply, fill #0

## 2022-06-13 NOTE — Patient Instructions (Signed)
I am going to check some labs and an xray on you today   I will follow up with you on Tuesday when I am back in the office

## 2022-06-13 NOTE — Progress Notes (Signed)
Subjective:    Patient ID: Shane Shove., male    DOB: 02-13-1981, 42 y.o.   MRN: [id number]  HPI 42 year old male who  has a past medical history of Allergy, Anxiety, Bipolar disorder ([place]), Bronchitis, Bronchitis, Crush injury lower leg, Depression, Gastric ulcer, GERD (gastroesophageal reflux disease), History of hiatal hernia, Hypertension, Migraine, Nerve pain, and RSD (reflex sympathetic dystrophy).  He presents to the office today for acute abdominal pain x 1 month. Pain is mostly on the right and left lower quadrants. Pain is felt to be a dull ache. Pain is constant but worse at times ( 1-2/10 at baseline and 9/10 at it's worse)  He reports constipation and noticing blood in stool at times.  Associated symptoms including nausea, painful bowel movements from constipation and intermittent fevers up to 102.  Denies chills and diarrhea.   At home he has been working on changing his diet, MOM, fiber supplements and Miralax. The only thing that really helps is MOM.   Has chronic constipation from opiate use   No UTI symptoms    Review of Systems See HPI   Past Medical History:  Diagnosis Date   Allergy    Anxiety    Bipolar disorder ([place])    Bronchitis    Bronchitis    Crush injury lower leg    Left lower leg   Depression    Gastric ulcer    GERD (gastroesophageal reflux disease)    will awaken him in night   History of hiatal hernia    Hypertension    Migraine    Nerve pain    RSD (reflex sympathetic dystrophy)     Social History   Socioeconomic History   Marital status: Married    Spouse name: Not on file   Number of children: 1   Years of education: 10th grade   Highest education level: Not on file  Occupational History   Occupation: Disabled    Comment: Previously worked Medical sales representative  Tobacco Use   Smoking status: Every Day    Packs/day: 0.25    Years: 23.00    Additional pack years: 0.00    Total pack years: 5.75    Types:  Cigarettes   Smokeless tobacco: Never   Tobacco comments:    Using nicotine patch  Vaping Use   Vaping Use: Never used  Substance and Sexual Activity   Alcohol use: Yes    Alcohol/week: 6.0 standard drinks of alcohol    Types: 6 Cans of beer per week    Comment: occasional   Drug use: No   Sexual activity: Yes    Partners: Female  Other Topics Concern   Not on file  Social History Narrative   05/24/12  Koren was born and grew up in [place], [place].    He has 2 sisters.    He reports that his childhood was "lousy."    He completed the 10th grade.    He has been married for 5 years, and is currently separated for 3 weeks.    He has a daughter who is 31-1/2 years old.    He has been unemployed for 2 years, and is disabled due to an on-the-job work accident.    He is currently living with his aunt and cousin.    He reports that his hobbies are sports and dogs.    He reports that he is spiritual, but not religious.    He states  that his aunt and his father are his social support system.    He denies any current legal problems, but got a DUI in 2007. 05/24/12 [facility]      Social Determinants of Health   Financial Resource Strain: High Risk (04/22/2022)   Overall Financial Resource Strain (CARDIA)    Difficulty of Paying Living Expenses: Hard  Food Insecurity: Food Insecurity Present (04/22/2022)   Hunger Vital Sign    Worried About Running Out of Food in the Last Year: Sometimes true    Ran Out of Food in the Last Year: Sometimes true  Transportation Needs: No Transportation Needs (04/22/2022)   PRAPARE - Hydrologist (Medical): No    Lack of Transportation (Non-Medical): No  Physical Activity: Insufficiently Active (04/22/2022)   Exercise Vital Sign    Days of Exercise per Week: 2 days    Minutes of Exercise per Session: 40 min  Stress: Stress Concern Present (04/22/2022)   [place]     Feeling of Stress : To some extent  Social Connections: Moderately Integrated (04/22/2022)   Social Connection and Isolation Panel [NHANES]    Frequency of Communication with Friends and Family: More than three times a week    Frequency of Social Gatherings with Friends and Family: Once a week    Attends Religious Services: More than 4 times per year    Active Member of [employer] or Organizations: No    Attends Music therapist: Not on file    Marital Status: Married  Human resources officer Violence: Not on file    Past Surgical History:  Procedure Laterality Date   CHOLECYSTECTOMY N/A 01/24/2014   Procedure: LAPAROSCOPIC CHOLECYSTECTOMY;  Surgeon: Ralene Ok, MD;  Location: [facility];  Service: General;  Laterality: N/A;   COLONOSCOPY     HAND SURGERY     MASS EXCISION Left 01/25/2013   Procedure: EXCISION MASS DORSAL ASPECT LEFT LONG FINGER DISTAL INTERPHALANGEAL JOINT;  Surgeon: Cammie Sickle., MD;  Location: [facility];  Service: Orthopedics;  Laterality: Left;  Left long    MOUTH SURGERY     TOTAL HIP ARTHROPLASTY Right 12/03/2020   Procedure: TOTAL HIP ARTHROPLASTY ANTERIOR APPROACH;  Surgeon: Leandrew Koyanagi, MD;  Location: [facility];  Service: Orthopedics;  Laterality: Right;  3-C    Family History  Problem Relation Age of Onset   Hyperlipidemia Father    Hypertension Father    Anxiety disorder Father    Drug abuse Father    Cancer Paternal Grandfather        lung, colon   Colon cancer Paternal Grandfather    Lung cancer Paternal Grandfather    Diabetes Paternal Grandmother    Cancer Paternal Grandmother    Cancer Maternal Grandmother    Cancer Maternal Grandfather    Lung cancer Maternal Grandfather    Stroke Other    Heart disease Other    Depression Paternal Aunt    Anxiety disorder Paternal Aunt    Drug abuse Paternal Uncle    Colon cancer Paternal Uncle    Esophageal cancer Neg Hx    Stomach cancer Neg Hx    Rectal cancer Neg Hx      Allergies  Allergen Reactions   Aspirin Shortness Of Breath and Tinitus    Tinnitus, dizzy, short of breath   Codeine Itching    Reaction to tylenol #3   Penicillins Anaphylaxis and Other (See Comments)  Was told never to take medication.  Has patient had a PCN reaction causing immediate rash, facial/tongue/throat swelling, SOB or lightheadedness with hypotension: no Has patient had a PCN reaction causing severe rash involving mucus membranes or skin necrosis: no Has patient had a PCN reaction that required hospitalization: no Has patient had a PCN reaction occurring within the last 10 years: no If all of the above answers are "NO", then may proceed with Cephalosporin use.    Tramadol     seizure    Amitriptyline Rash    Current Outpatient Medications on File Prior to Visit  Medication Sig Dispense Refill   diphenhydrAMINE (BENADRYL) 25 MG tablet Take 25 mg by mouth every 6 (six) hours as needed for allergies.     doxycycline (VIBRAMYCIN) 100 MG capsule Take 1 capsule (100 mg total) by mouth 2 (two) times daily. 14 capsule 0   famotidine (PEPCID) 20 MG tablet Take 20 mg by mouth daily as needed for heartburn.     gabapentin (NEURONTIN) 300 MG capsule Take 1 capsule (300 mg total) by mouth 3 (three) times daily. 270 capsule 0   ibuprofen (ADVIL) 800 MG tablet Take 1 tablet by mouth three times daily as needed. 90 tablet 6   ibuprofen (ADVIL) 800 MG tablet Take 1 tablet (800 mg total) by mouth 3 (three) times daily as needed. 90 tablet 6   lidocaine (LIDODERM) 5 % Place 2 patches onto the skin daily. Remove & Discard patch within 12 hours or as directed by MD 60 patch 3   lidocaine (XYLOCAINE) 5 % ointment Apply topically as needed for moderate pain. 50 g 2   Lidocaine 3 % CREA Apply 1 application topically daily as needed (pain).     metoprolol succinate (TOPROL-XL) 25 MG 24 hr tablet Take 1 tablet (25 mg total) by mouth daily. 90 tablet 1   Multiple Vitamin (MULTIVITAMIN WITH  MINERALS) TABS tablet Take 1 tablet by mouth daily.     Nerve Stimulator (TENS THERAPY PAIN RELIEF) DEVI 1 application by Does not apply route daily as needed (chronic pain).     nicotine (NICODERM CQ - DOSED IN MG/24 HOURS) 21 mg/24hr patch Place onto the skin as directed 28 patch 0   nicotine polacrilex (NICORETTE) 4 MG gum USE AS DIRECTED 110 each 0   nortriptyline (PAMELOR) 75 MG capsule Take 1 capsule (75 mg total) by mouth at bedtime. 90 capsule 0   ondansetron (ZOFRAN ODT) 4 MG disintegrating tablet Dissolve 1 tablet (4 mg total) by mouth every 8 (eight) hours as needed for nausea or vomiting. 20 tablet 2   oxyCODONE-acetaminophen (PERCOCET/ROXICET) 5-325 MG tablet Take 1 tablet by mouth every 8 (eight) hours. 90 tablet 0   oxyCODONE-acetaminophen (PERCOCET/ROXICET) 5-325 MG tablet Take 1 tablet by mouth every 8 (eight) hours. 90 tablet 0   oxyCODONE-acetaminophen (PERCOCET/ROXICET) 5-325 MG tablet Take 1 tablet by mouth every 8 (eight) hours. 90 tablet 0   pantoprazole (PROTONIX) 40 MG tablet Take 1 tablet twice daily 30 to 60 minutes before breakfast and dinner. 60 tablet 5   tiZANidine (ZANAFLEX) 4 MG tablet Take 1 tablet (4 mg total) by mouth 2 (two) times daily. 180 tablet 0   No current facility-administered medications on file prior to visit.    BP 108/78   Pulse 77   Temp 98 F (36.7 C) (Oral)   Ht 5\' 11"  (1.803 m)   Wt 225 lb (102.1 kg)   SpO2 98%   BMI 31.38 kg/m  Objective:   Physical Exam Vitals and nursing note reviewed.  Constitutional:      Appearance: He is well-developed.  Cardiovascular:     Rate and Rhythm: Regular rhythm.     Pulses: Normal pulses.     Heart sounds: Normal heart sounds.  Pulmonary:     Effort: Pulmonary effort is normal.     Breath sounds: Normal breath sounds.  Abdominal:     General: Abdomen is flat. Bowel sounds are normal. There is distension.     Palpations: Abdomen is soft.     Tenderness: There is abdominal tenderness  in the right lower quadrant, suprapubic area and left lower quadrant. Negative signs include Murphy's sign and McBurney's sign.     Hernia: No hernia is present.  Genitourinary:    Prostate: Normal.     Rectum: Guaiac result negative. Internal hemorrhoid present. No tenderness. Normal anal tone.  Musculoskeletal:        General: Normal range of motion.  Skin:    General: Skin is warm and dry.  Neurological:     General: No focal deficit present.     Mental Status: He is alert and oriented to person, place, and time.  Psychiatric:        Mood and Affect: Mood normal.        Behavior: Behavior normal.        Thought Content: Thought content normal.        Judgment: Judgment normal.       Assessment & Plan:  1. Generalized abdominal pain - Unclear cause but likely some constipation. Does not appear as appendicitis or prostatitis . Consider IBS-C  - Will do labs and KUB today. Consider CT abdomen  - DG Abd 1 View; Future - CBC with Differential/Platelet; Future - Comprehensive metabolic panel; Future - Lipase; Future - Amylase; Future - Urinalysis; Future  2. Chronic constipation  - lubiprostone (AMITIZA) 24 MCG capsule; Take 1 capsule (24 mcg total) by mouth 2 (two) times daily with a meal.  Dispense: 60 capsule; Refill: 0  Dorothyann Peng, NP

## 2022-06-13 NOTE — Addendum Note (Signed)
Addended by: Rosalyn Gess D on: 06/13/2022 04:11 PM   Modules accepted: Orders

## 2022-06-14 ENCOUNTER — Other Ambulatory Visit (HOSPITAL_COMMUNITY): Payer: Self-pay

## 2022-06-14 LAB — CBC WITH DIFFERENTIAL/PLATELET
Absolute Monocytes: 331 cells/uL (ref 200–950)
Basophils Absolute: 29 cells/uL (ref 0–200)
Basophils Relative: 0.5 %
Eosinophils Absolute: 200 cells/uL (ref 15–500)
Eosinophils Relative: 3.5 %
HCT: 45.2 % (ref 38.5–50.0)
Hemoglobin: 15 g/dL (ref 13.2–17.1)
Lymphs Abs: 1830 cells/uL (ref 850–3900)
MCH: 30 pg (ref 27.0–33.0)
MCHC: 33.2 g/dL (ref 32.0–36.0)
MCV: 90.4 fL (ref 80.0–100.0)
MPV: 10.1 fL (ref 7.5–12.5)
Monocytes Relative: 5.8 %
Neutro Abs: 3312 cells/uL (ref 1500–7800)
Neutrophils Relative %: 58.1 %
Platelets: 254 10*3/uL (ref 140–400)
RBC: 5 10*6/uL (ref 4.20–5.80)
RDW: 12.3 % (ref 11.0–15.0)
Total Lymphocyte: 32.1 %
WBC: 5.7 10*3/uL (ref 3.8–10.8)

## 2022-06-14 LAB — COMPREHENSIVE METABOLIC PANEL
AG Ratio: 2.1 (calc) (ref 1.0–2.5)
ALT: 40 U/L (ref 9–46)
AST: 24 U/L (ref 10–40)
Albumin: 5.1 g/dL (ref 3.6–5.1)
Alkaline phosphatase (APISO): 97 U/L (ref 36–130)
BUN: 13 mg/dL (ref 7–25)
CO2: 24 mmol/L (ref 20–32)
Calcium: 9.7 mg/dL (ref 8.6–10.3)
Chloride: 103 mmol/L (ref 98–110)
Creat: 0.96 mg/dL (ref 0.60–1.29)
Globulin: 2.4 g/dL (calc) (ref 1.9–3.7)
Glucose, Bld: 88 mg/dL (ref 65–99)
Potassium: 4.6 mmol/L (ref 3.5–5.3)
Sodium: 141 mmol/L (ref 135–146)
Total Bilirubin: 0.3 mg/dL (ref 0.2–1.2)
Total Protein: 7.5 g/dL (ref 6.1–8.1)

## 2022-06-14 LAB — URINALYSIS
Bilirubin Urine: NEGATIVE
Glucose, UA: NEGATIVE
Hgb urine dipstick: NEGATIVE
Ketones, ur: NEGATIVE
Leukocytes,Ua: NEGATIVE
Nitrite: NEGATIVE
Protein, ur: NEGATIVE
Specific Gravity, Urine: 1.008 (ref 1.001–1.035)
pH: 5.5 (ref 5.0–8.0)

## 2022-06-14 LAB — LIPASE: Lipase: 11 U/L (ref 7–60)

## 2022-06-14 LAB — AMYLASE: Amylase: 35 U/L (ref 21–101)

## 2022-06-16 ENCOUNTER — Other Ambulatory Visit: Payer: Self-pay

## 2022-06-17 ENCOUNTER — Encounter: Payer: Self-pay | Admitting: Adult Health

## 2022-06-17 ENCOUNTER — Other Ambulatory Visit: Payer: Self-pay | Admitting: Adult Health

## 2022-06-17 DIAGNOSIS — R1084 Generalized abdominal pain: Secondary | ICD-10-CM

## 2022-06-24 ENCOUNTER — Other Ambulatory Visit (HOSPITAL_COMMUNITY): Payer: Self-pay

## 2022-06-25 ENCOUNTER — Other Ambulatory Visit: Payer: Self-pay

## 2022-07-08 ENCOUNTER — Ambulatory Visit (HOSPITAL_BASED_OUTPATIENT_CLINIC_OR_DEPARTMENT_OTHER)
Admission: RE | Admit: 2022-07-08 | Discharge: 2022-07-08 | Disposition: A | Discharge status: Home / Self Care | Payer: 59 | Source: Ambulatory Visit | Attending: Adult Health | Admitting: Adult Health

## 2022-07-08 DIAGNOSIS — R1030 Lower abdominal pain, unspecified: Secondary | ICD-10-CM | POA: Diagnosis not present

## 2022-07-08 DIAGNOSIS — R1084 Generalized abdominal pain: Secondary | ICD-10-CM | POA: Insufficient documentation

## 2022-07-08 DIAGNOSIS — R1110 Vomiting, unspecified: Secondary | ICD-10-CM | POA: Diagnosis not present

## 2022-07-08 DIAGNOSIS — K760 Fatty (change of) liver, not elsewhere classified: Secondary | ICD-10-CM | POA: Diagnosis not present

## 2022-07-08 MED ORDER — IOHEXOL 300 MG/ML  SOLN
100.0000 mL | Freq: Once | INTRAMUSCULAR | Status: AC | PRN
  Administered 2022-07-08: 100 mL via INTRAVENOUS

## 2022-07-09 ENCOUNTER — Other Ambulatory Visit (HOSPITAL_COMMUNITY): Payer: Self-pay

## 2022-07-09 ENCOUNTER — Other Ambulatory Visit: Payer: Self-pay | Admitting: Adult Health

## 2022-07-09 ENCOUNTER — Other Ambulatory Visit: Payer: Self-pay

## 2022-07-09 DIAGNOSIS — K5909 Other constipation: Secondary | ICD-10-CM

## 2022-07-09 MED ORDER — LUBIPROSTONE 24 MCG PO CAPS
24.0000 ug | ORAL_CAPSULE | Freq: Two times a day (BID) | ORAL | 0 refills | Status: DC
Start: 2022-07-09 — End: 2022-08-06
  Filled 2022-07-09: qty 60, 30d supply, fill #0

## 2022-07-10 ENCOUNTER — Other Ambulatory Visit (HOSPITAL_COMMUNITY): Payer: Self-pay

## 2022-07-10 ENCOUNTER — Other Ambulatory Visit: Payer: Self-pay

## 2022-07-15 ENCOUNTER — Telehealth: Payer: Self-pay | Admitting: Adult Health

## 2022-07-15 NOTE — Telephone Encounter (Signed)
Patient notified of lab update  and verbalized understanding.

## 2022-07-15 NOTE — Telephone Encounter (Signed)
Pt called, returning CMA's call. CMA was with a patient Pt asked that CMA call back at her earliest convenience. 

## 2022-07-16 ENCOUNTER — Ambulatory Visit (INDEPENDENT_AMBULATORY_CARE_PROVIDER_SITE_OTHER): Payer: 59 | Admitting: Adult Health

## 2022-07-16 ENCOUNTER — Encounter: Payer: Self-pay | Admitting: Adult Health

## 2022-07-16 VITALS — BP 120/82 | HR 103 | Temp 97.6°F | Ht 71.0 in | Wt 230.0 lb

## 2022-07-16 DIAGNOSIS — G90529 Complex regional pain syndrome I of unspecified lower limb: Secondary | ICD-10-CM

## 2022-07-16 DIAGNOSIS — G894 Chronic pain syndrome: Secondary | ICD-10-CM

## 2022-07-16 DIAGNOSIS — M130 Polyarthritis, unspecified: Secondary | ICD-10-CM | POA: Diagnosis not present

## 2022-07-16 NOTE — Progress Notes (Signed)
Subjective:    Patient ID: Shane Cole., male    DOB: 1980/07/19, 42 y.o.   MRN: [id number]  HPI  42 year old male who  has a past medical history of Allergy, Anxiety, Bipolar disorder, Bronchitis, Bronchitis, Crush injury lower leg, Depression, Gastric ulcer, GERD (gastroesophageal reflux disease), History of hiatal hernia, Hypertension, Migraine, Nerve pain, and RSD (reflex sympathetic dystrophy).  He presents to the office today for pain management visit   Indication for chronic opioid: Chronic arthritic pain of multiple joints  Medication and dose:Percocet 5 mg TID , gabapentin 600 mg TID, Nortriptyline 75 mg QHS, Motrin 800 mg TID.  # pills per month: 90 tabs of percocet 5 mg monthly  Last UDS date: 02/11/2022 Opioid Treatment Agreement signed: Yes, 02/11/2022 Opioid Treatment Agreement last reviewed with patient: 07/16/2022 [facility] reviewed this encounter (include red flags):  Reviewed and no red flags.   He would like to change his Percocet to Oxycodone 5 mg.    Review of Systems See HPI   Past Medical History:  Diagnosis Date   Allergy    Anxiety    Bipolar disorder    Bronchitis    Bronchitis    Crush injury lower leg    Left lower leg   Depression    Gastric ulcer    GERD (gastroesophageal reflux disease)    will awaken him in night   History of hiatal hernia    Hypertension    Migraine    Nerve pain    RSD (reflex sympathetic dystrophy)     Social History   Socioeconomic History   Marital status: Married    Spouse name: Not on file   Number of children: 1   Years of education: 10th grade   Highest education level: Not on file  Occupational History   Occupation: Disabled    Comment: Previously worked Web designer  Tobacco Use   Smoking status: Every Day    Packs/day: 0.25    Years: 23.00    Additional pack years: 0.00    Total pack years: 5.75    Types: Cigarettes   Smokeless tobacco: Never   Tobacco comments:     Using nicotine patch  Vaping Use   Vaping Use: Never used  Substance and Sexual Activity   Alcohol use: Yes    Alcohol/week: 6.0 standard drinks of alcohol    Types: 6 Cans of beer per week    Comment: occasional   Drug use: No   Sexual activity: Yes    Partners: Female  Other Topics Concern   Not on file  Social History Narrative   05/24/12  Ender was born and grew up in [place], [place].    He has 2 sisters.    He reports that his childhood was "lousy."    He completed the 10th grade.    He has been married for 5 years, and is currently separated for 3 weeks.    He has a daughter who is 10-1/2 years old.    He has been unemployed for 2 years, and is disabled due to an on-the-job work accident.    He is currently living with his aunt and cousin.    He reports that his hobbies are sports and dogs.    He reports that he is spiritual, but not religious.    He states that his aunt and his father are his social support system.    He denies any current legal  problems, but got a DUI in 2007. 05/24/12 [facility]      Social Determinants of Health   Financial Resource Strain: High Risk (04/22/2022)   Overall Financial Resource Strain (CARDIA)    Difficulty of Paying Living Expenses: Hard  Food Insecurity: Food Insecurity Present (04/22/2022)   Hunger Vital Sign    Worried About Running Out of Food in the Last Year: Sometimes true    Ran Out of Food in the Last Year: Sometimes true  Transportation Needs: No Transportation Needs (04/22/2022)   PRAPARE - Administrator, Civil Service (Medical): No    Lack of Transportation (Non-Medical): No  Physical Activity: Insufficiently Active (04/22/2022)   Exercise Vital Sign    Days of Exercise per Week: 2 days    Minutes of Exercise per Session: 40 min  Stress: Stress Concern Present (04/22/2022)   [employer] of Occupational Health - Occupational Stress Questionnaire    Feeling of Stress : To some extent  Social Connections:  Moderately Integrated (04/22/2022)   Social Connection and Isolation Panel [NHANES]    Frequency of Communication with Friends and Family: More than three times a week    Frequency of Social Gatherings with Friends and Family: Once a week    Attends Religious Services: More than 4 times per year    Active Member of [employer] or Organizations: No    Attends Engineer, structural: Not on file    Marital Status: Married  Catering manager Violence: Not on file    Past Surgical History:  Procedure Laterality Date   CHOLECYSTECTOMY N/A 01/24/2014   Procedure: LAPAROSCOPIC CHOLECYSTECTOMY;  Surgeon: Axel Filler, MD;  Location: [facility];  Service: General;  Laterality: N/A;   COLONOSCOPY     HAND SURGERY     MASS EXCISION Left 01/25/2013   Procedure: EXCISION MASS DORSAL ASPECT LEFT LONG FINGER DISTAL INTERPHALANGEAL JOINT;  Surgeon: Wyn Forster., MD;  Location: [facility];  Service: Orthopedics;  Laterality: Left;  Left long    MOUTH SURGERY     TOTAL HIP ARTHROPLASTY Right 12/03/2020   Procedure: TOTAL HIP ARTHROPLASTY ANTERIOR APPROACH;  Surgeon: Tarry Kos, MD;  Location: [facility];  Service: Orthopedics;  Laterality: Right;  3-C    Family History  Problem Relation Age of Onset   Hyperlipidemia Father    Hypertension Father    Anxiety disorder Father    Drug abuse Father    Cancer Paternal Grandfather        lung, colon   Colon cancer Paternal Grandfather    Lung cancer Paternal Grandfather    Diabetes Paternal Grandmother    Cancer Paternal Grandmother    Cancer Maternal Grandmother    Cancer Maternal Grandfather    Lung cancer Maternal Grandfather    Stroke Other    Heart disease Other    Depression Paternal Aunt    Anxiety disorder Paternal Aunt    Drug abuse Paternal Uncle    Colon cancer Paternal Uncle    Esophageal cancer Neg Hx    Stomach cancer Neg Hx    Rectal cancer Neg Hx     Allergies  Allergen Reactions   Aspirin Shortness Of Breath  and Tinitus    Tinnitus, dizzy, short of breath   Codeine Itching    Reaction to tylenol #3   Penicillins Anaphylaxis and Other (See Comments)    Was told never to take medication.  Has patient had a PCN reaction causing immediate rash, facial/tongue/throat  swelling, SOB or lightheadedness with hypotension: no Has patient had a PCN reaction causing severe rash involving mucus membranes or skin necrosis: no Has patient had a PCN reaction that required hospitalization: no Has patient had a PCN reaction occurring within the last 10 years: no If all of the above answers are "NO", then may proceed with Cephalosporin use.    Tramadol     seizure    Amitriptyline Rash    Current Outpatient Medications on File Prior to Visit  Medication Sig Dispense Refill   diphenhydrAMINE (BENADRYL) 25 MG tablet Take 25 mg by mouth every 6 (six) hours as needed for allergies.     famotidine (PEPCID) 20 MG tablet Take 20 mg by mouth daily as needed for heartburn.     gabapentin (NEURONTIN) 300 MG capsule Take 1 capsule (300 mg total) by mouth 3 (three) times daily. 270 capsule 0   ibuprofen (ADVIL) 800 MG tablet Take 1 tablet by mouth three times daily as needed. 90 tablet 6   ibuprofen (ADVIL) 800 MG tablet Take 1 tablet (800 mg total) by mouth 3 (three) times daily as needed. 90 tablet 6   lidocaine (LIDODERM) 5 % Place 2 patches onto the skin daily. Remove & Discard patch within 12 hours or as directed by MD 60 patch 3   lidocaine (XYLOCAINE) 5 % ointment Apply topically as needed for moderate pain. 50 g 2   Lidocaine 3 % CREA Apply 1 application topically daily as needed (pain).     lubiprostone (AMITIZA) 24 MCG capsule Take 1 capsule (24 mcg total) by mouth 2 (two) times daily with a meal. 60 capsule 0   metoprolol succinate (TOPROL-XL) 25 MG 24 hr tablet Take 1 tablet (25 mg total) by mouth daily. 90 tablet 1   Multiple Vitamin (MULTIVITAMIN WITH MINERALS) TABS tablet Take 1 tablet by mouth daily.      Nerve Stimulator (TENS THERAPY PAIN RELIEF) DEVI 1 application by Does not apply route daily as needed (chronic pain).     nicotine (NICODERM CQ - DOSED IN MG/24 HOURS) 21 mg/24hr patch Place onto the skin as directed 28 patch 0   nicotine polacrilex (NICORETTE) 4 MG gum USE AS DIRECTED 110 each 0   nortriptyline (PAMELOR) 75 MG capsule Take 1 capsule (75 mg total) by mouth at bedtime. 90 capsule 0   ondansetron (ZOFRAN ODT) 4 MG disintegrating tablet Dissolve 1 tablet (4 mg total) by mouth every 8 (eight) hours as needed for nausea or vomiting. 20 tablet 2   oxyCODONE-acetaminophen (PERCOCET/ROXICET) 5-325 MG tablet Take 1 tablet by mouth every 8 (eight) hours. 90 tablet 0   oxyCODONE-acetaminophen (PERCOCET/ROXICET) 5-325 MG tablet Take 1 tablet by mouth every 8 (eight) hours. 90 tablet 0   oxyCODONE-acetaminophen (PERCOCET/ROXICET) 5-325 MG tablet Take 1 tablet by mouth every 8 (eight) hours. 90 tablet 0   pantoprazole (PROTONIX) 40 MG tablet Take 1 tablet twice daily 30 to 60 minutes before breakfast and dinner. 60 tablet 5   tiZANidine (ZANAFLEX) 4 MG tablet Take 1 tablet (4 mg total) by mouth 2 (two) times daily. 180 tablet 0   No current facility-administered medications on file prior to visit.    BP 120/82   Pulse (!) 103   Temp 97.6 F (36.4 C) (Oral)   Ht 5\' 11"  (1.803 m)   Wt 230 lb (104.3 kg)   SpO2 98%   BMI 32.08 kg/m       Objective:   Physical Exam Vitals and nursing  note reviewed.  Constitutional:      Appearance: Normal appearance.  Cardiovascular:     Rate and Rhythm: Normal rate and regular rhythm.     Pulses: Normal pulses.     Heart sounds: Normal heart sounds.  Pulmonary:     Effort: Pulmonary effort is normal.     Breath sounds: Normal breath sounds.  Skin:    General: Skin is warm and dry.  Neurological:     General: No focal deficit present.     Mental Status: He is alert and oriented to person, place, and time.  Psychiatric:        Mood and  Affect: Mood normal.        Behavior: Behavior normal.        Thought Content: Thought content normal.       Assessment & Plan:  1. Chronic pain syndrome  - DRUG MONITOR, PANEL 1, W/CONF, URINE; Future  2. Polyarthritis  - DRUG MONITOR, PANEL 1, W/CONF, URINE; Future  3. Complex regional pain syndrome type 1 of lower extremity, unspecified laterality  - DRUG MONITOR, PANEL 1, W/CONF, URINE; Future  Shirline Frees, NP

## 2022-07-16 NOTE — Telephone Encounter (Signed)
Pt will postpone GI Referral for right now.

## 2022-07-18 ENCOUNTER — Other Ambulatory Visit (HOSPITAL_COMMUNITY): Payer: Self-pay

## 2022-07-19 LAB — DRUG MONITOR, PANEL 1, W/CONF, URINE
Amphetamines: NEGATIVE ng/mL (ref <500)
Barbiturates: NEGATIVE ng/mL (ref <300)
Benzodiazepines: NEGATIVE ng/mL (ref <100)
Cocaine Metabolite: NEGATIVE ng/mL (ref <150)
Codeine: NEGATIVE ng/mL (ref <50)
Creatinine: 37.9 mg/dL (ref >=20.0)
Hydrocodone: NEGATIVE ng/mL (ref <50)
Hydromorphone: NEGATIVE ng/mL (ref <50)
Marijuana Metabolite: NEGATIVE ng/mL (ref <20)
Methadone Metabolite: NEGATIVE ng/mL (ref <100)
Morphine: NEGATIVE ng/mL (ref <50)
Norhydrocodone: NEGATIVE ng/mL (ref <50)
Noroxycodone: 679 ng/mL — ABNORMAL HIGH (ref <50)
Opiates: NEGATIVE ng/mL (ref <100)
Oxidant: NEGATIVE ug/mL (ref <200)
Oxycodone: 553 ng/mL — ABNORMAL HIGH (ref <50)
Oxycodone: POSITIVE ng/mL — AB (ref <100)
Oxymorphone: 159 ng/mL — ABNORMAL HIGH (ref <50)
Phencyclidine: NEGATIVE ng/mL (ref <25)
pH: 6.1 (ref 4.5–9.0)

## 2022-07-19 LAB — DM TEMPLATE

## 2022-08-06 ENCOUNTER — Other Ambulatory Visit: Payer: Self-pay

## 2022-08-06 ENCOUNTER — Other Ambulatory Visit: Payer: Self-pay | Admitting: Adult Health

## 2022-08-06 ENCOUNTER — Encounter: Payer: Self-pay | Admitting: Adult Health

## 2022-08-06 DIAGNOSIS — M130 Polyarthritis, unspecified: Secondary | ICD-10-CM

## 2022-08-06 DIAGNOSIS — R52 Pain, unspecified: Secondary | ICD-10-CM

## 2022-08-06 DIAGNOSIS — G894 Chronic pain syndrome: Secondary | ICD-10-CM

## 2022-08-06 DIAGNOSIS — G90529 Complex regional pain syndrome I of unspecified lower limb: Secondary | ICD-10-CM

## 2022-08-06 DIAGNOSIS — K5909 Other constipation: Secondary | ICD-10-CM

## 2022-08-06 MED ORDER — OXYCODONE HCL 5 MG PO TABS
5.0000 mg | ORAL_TABLET | Freq: Three times a day (TID) | ORAL | 0 refills | Status: DC
Start: 2022-08-06 — End: 2022-11-11
  Filled 2022-08-06 – 2022-10-02 (×2): qty 90, 30d supply, fill #0

## 2022-08-06 MED ORDER — NORTRIPTYLINE HCL 75 MG PO CAPS
75.0000 mg | ORAL_CAPSULE | Freq: Every day | ORAL | 0 refills | Status: DC
Start: 2022-08-06 — End: 2022-11-11
  Filled 2022-08-06 – 2022-08-07 (×2): qty 90, 90d supply, fill #0

## 2022-08-06 MED ORDER — OXYCODONE HCL 5 MG PO TABS
5.0000 mg | ORAL_TABLET | Freq: Three times a day (TID) | ORAL | 0 refills | Status: DC
Start: 2022-08-06 — End: 2022-11-11
  Filled 2022-08-06 – 2022-09-04 (×3): qty 90, 30d supply, fill #0

## 2022-08-06 MED ORDER — LUBIPROSTONE 24 MCG PO CAPS
24.0000 ug | ORAL_CAPSULE | Freq: Two times a day (BID) | ORAL | 3 refills | Status: AC
Start: 2022-08-06 — End: 2022-11-05
  Filled 2022-08-06 – 2022-08-07 (×2): qty 180, 90d supply, fill #0

## 2022-08-06 MED ORDER — GABAPENTIN 300 MG PO CAPS
300.0000 mg | ORAL_CAPSULE | Freq: Three times a day (TID) | ORAL | 0 refills | Status: DC
Start: 2022-08-06 — End: 2022-11-11
  Filled 2022-08-06 – 2022-08-07 (×2): qty 270, 90d supply, fill #0

## 2022-08-06 MED ORDER — OXYCODONE HCL 5 MG PO TABS
5.0000 mg | ORAL_TABLET | Freq: Three times a day (TID) | ORAL | 0 refills | Status: DC
Start: 2022-08-06 — End: 2022-11-11
  Filled 2022-08-06 – 2022-08-07 (×3): qty 90, 30d supply, fill #0

## 2022-08-06 NOTE — Telephone Encounter (Signed)
Please advise 

## 2022-08-07 ENCOUNTER — Other Ambulatory Visit (HOSPITAL_COMMUNITY): Payer: Self-pay

## 2022-08-07 ENCOUNTER — Other Ambulatory Visit: Payer: Self-pay | Admitting: Adult Health

## 2022-08-07 ENCOUNTER — Other Ambulatory Visit: Payer: Self-pay

## 2022-08-07 MED ORDER — LIDOCAINE 5 % EX PTCH
2.0000 | MEDICATED_PATCH | CUTANEOUS | 3 refills | Status: AC
  Filled 2022-08-07: qty 60, 30d supply, fill #0
  Filled 2022-09-02 – 2022-09-04 (×2): qty 60, 30d supply, fill #1
  Filled 2023-02-04: qty 60, 30d supply, fill #2

## 2022-08-08 ENCOUNTER — Other Ambulatory Visit (HOSPITAL_COMMUNITY): Payer: Self-pay

## 2022-09-02 ENCOUNTER — Other Ambulatory Visit (HOSPITAL_COMMUNITY): Payer: Self-pay

## 2022-09-02 ENCOUNTER — Other Ambulatory Visit: Payer: Self-pay

## 2022-09-04 ENCOUNTER — Other Ambulatory Visit (HOSPITAL_COMMUNITY): Payer: Self-pay

## 2022-09-04 ENCOUNTER — Other Ambulatory Visit: Payer: Self-pay

## 2022-09-05 ENCOUNTER — Other Ambulatory Visit: Payer: Self-pay

## 2022-10-02 ENCOUNTER — Encounter: Payer: Self-pay | Admitting: Adult Health

## 2022-10-02 ENCOUNTER — Other Ambulatory Visit (HOSPITAL_COMMUNITY): Payer: Self-pay

## 2022-10-03 ENCOUNTER — Other Ambulatory Visit: Payer: Self-pay

## 2022-10-17 ENCOUNTER — Ambulatory Visit: Payer: 59 | Admitting: Adult Health

## 2022-11-05 ENCOUNTER — Encounter: Payer: Self-pay | Admitting: Adult Health

## 2022-11-05 NOTE — Telephone Encounter (Signed)
Please advise 

## 2022-11-11 ENCOUNTER — Other Ambulatory Visit (HOSPITAL_COMMUNITY): Payer: Self-pay

## 2022-11-11 ENCOUNTER — Encounter: Payer: Self-pay | Admitting: Adult Health

## 2022-11-11 ENCOUNTER — Other Ambulatory Visit: Payer: Self-pay

## 2022-11-11 ENCOUNTER — Ambulatory Visit (INDEPENDENT_AMBULATORY_CARE_PROVIDER_SITE_OTHER): Payer: 59 | Admitting: Adult Health

## 2022-11-11 VITALS — BP 120/80 | HR 100 | Temp 97.8°F | Ht 71.0 in | Wt 221.0 lb

## 2022-11-11 DIAGNOSIS — R52 Pain, unspecified: Secondary | ICD-10-CM

## 2022-11-11 DIAGNOSIS — G894 Chronic pain syndrome: Secondary | ICD-10-CM

## 2022-11-11 DIAGNOSIS — G90529 Complex regional pain syndrome I of unspecified lower limb: Secondary | ICD-10-CM

## 2022-11-11 DIAGNOSIS — M130 Polyarthritis, unspecified: Secondary | ICD-10-CM | POA: Diagnosis not present

## 2022-11-11 MED ORDER — OXYCODONE HCL 5 MG PO TABS
5.0000 mg | ORAL_TABLET | Freq: Three times a day (TID) | ORAL | 0 refills | Status: DC
Start: 2022-11-11 — End: 2023-02-04
  Filled 2022-11-11 – 2023-01-08 (×2): qty 90, 30d supply, fill #0

## 2022-11-11 MED ORDER — NORTRIPTYLINE HCL 75 MG PO CAPS
75.0000 mg | ORAL_CAPSULE | Freq: Every day | ORAL | 0 refills | Status: DC
  Filled 2022-11-11: qty 90, 90d supply, fill #0

## 2022-11-11 MED ORDER — OXYCODONE HCL 5 MG PO TABS
5.0000 mg | ORAL_TABLET | Freq: Three times a day (TID) | ORAL | 0 refills | Status: DC
Start: 2022-11-11 — End: 2023-02-05
  Filled 2022-11-11 – 2022-12-10 (×2): qty 90, 30d supply, fill #0

## 2022-11-11 MED ORDER — PREDNISONE 10 MG PO TABS
10.0000 mg | ORAL_TABLET | Freq: Every day | ORAL | 0 refills | Status: DC
Start: 2022-11-11 — End: 2023-03-12
  Filled 2022-11-11: qty 5, 5d supply, fill #0

## 2022-11-11 MED ORDER — OXYCODONE HCL 5 MG PO TABS
5.0000 mg | ORAL_TABLET | Freq: Three times a day (TID) | ORAL | 0 refills | Status: DC
Start: 2022-11-11 — End: 2023-02-05
  Filled 2022-11-11 (×2): qty 90, 30d supply, fill #0

## 2022-11-11 MED ORDER — GABAPENTIN 300 MG PO CAPS
300.0000 mg | ORAL_CAPSULE | Freq: Three times a day (TID) | ORAL | 0 refills | Status: DC
  Filled 2022-11-11: qty 270, 90d supply, fill #0

## 2022-11-11 NOTE — Progress Notes (Signed)
Subjective:    Patient ID: Shane Cole., male    DOB: 08/19/1980, 42 y.o.   MRN: [id number]  HPI He presents to the office today for pain management visit   Indication for chronic opioid: Chronic arthritic pain of multiple joints  Medication and dose:Oxycodone 5 mg TID , gabapentin 600 mg TID, Nortriptyline 75 mg QHS, Motrin 800 mg TID.  # pills per month: 90 tabs of percocet 5 mg monthly  Last UDS date: 02/11/2022 Opioid Treatment Agreement signed: Yes, 02/11/2022 Opioid Treatment Agreement last reviewed with patient: 07/16/2022 [facility] reviewed this encounter (include red flags):  Reviewed and no red flags.   Pain has been a little worse lately. He feels stiff in most of his joints    Review of Systems See HPI   Past Medical History:  Diagnosis Date   Allergy    Anxiety    Bipolar disorder (HCC)    Bronchitis    Bronchitis    Crush injury lower leg    Left lower leg   Depression    Gastric ulcer    GERD (gastroesophageal reflux disease)    will awaken him in night   History of hiatal hernia    Hypertension    Migraine    Nerve pain    RSD (reflex sympathetic dystrophy)     Social History   Socioeconomic History   Marital status: Married    Spouse name: Not on file   Number of children: 1   Years of education: 10th grade   Highest education level: Not on file  Occupational History   Occupation: Disabled    Comment: Previously worked Web designer  Tobacco Use   Smoking status: Every Day    Current packs/day: 0.25    Average packs/day: 0.3 packs/day for 23.0 years (5.8 ttl pk-yrs)    Types: Cigarettes   Smokeless tobacco: Never   Tobacco comments:    Using nicotine patch  Vaping Use   Vaping status: Never Used  Substance and Sexual Activity   Alcohol use: Yes    Alcohol/week: 6.0 standard drinks of alcohol    Types: 6 Cans of beer per week    Comment: occasional   Drug use: No   Sexual activity: Yes    Partners: Female   Other Topics Concern   Not on file  Social History Narrative   05/24/12  Webster was born and grew up in [place], [place].    He has 2 sisters.    He reports that his childhood was "lousy."    He completed the 10th grade.    He has been married for 5 years, and is currently separated for 3 weeks.    He has a daughter who is 51-1/2 years old.    He has been unemployed for 2 years, and is disabled due to an on-the-job work accident.    He is currently living with his aunt and cousin.    He reports that his hobbies are sports and dogs.    He reports that he is spiritual, but not religious.    He states that his aunt and his father are his social support system.    He denies any current legal problems, but got a DUI in 2007. 05/24/12 [facility]      Social Determinants of Health   Financial Resource Strain: High Risk (04/22/2022)   Overall Financial Resource Strain (CARDIA)    Difficulty of Paying Living Expenses: Hard  Food  Insecurity: Food Insecurity Present (04/22/2022)   Hunger Vital Sign    Worried About Running Out of Food in the Last Year: Sometimes true    Ran Out of Food in the Last Year: Sometimes true  Transportation Needs: No Transportation Needs (04/22/2022)   PRAPARE - Administrator, Civil Service (Medical): No    Lack of Transportation (Non-Medical): No  Physical Activity: Insufficiently Active (04/22/2022)   Exercise Vital Sign    Days of Exercise per Week: 2 days    Minutes of Exercise per Session: 40 min  Stress: Stress Concern Present (04/22/2022)   [employer] of Occupational Health - Occupational Stress Questionnaire    Feeling of Stress : To some extent  Social Connections: Moderately Integrated (04/22/2022)   Social Connection and Isolation Panel [NHANES]    Frequency of Communication with Friends and Family: More than three times a week    Frequency of Social Gatherings with Friends and Family: Once a week    Attends Religious Services: More than 4  times per year    Active Member of [employer] or Organizations: No    Attends Engineer, structural: Not on file    Marital Status: Married  Catering manager Violence: Not on file    Past Surgical History:  Procedure Laterality Date   CHOLECYSTECTOMY N/A 01/24/2014   Procedure: LAPAROSCOPIC CHOLECYSTECTOMY;  Surgeon: Axel Filler, MD;  Location: [facility];  Service: General;  Laterality: N/A;   COLONOSCOPY     HAND SURGERY     MASS EXCISION Left 01/25/2013   Procedure: EXCISION MASS DORSAL ASPECT LEFT LONG FINGER DISTAL INTERPHALANGEAL JOINT;  Surgeon: Wyn Forster., MD;  Location: [facility];  Service: Orthopedics;  Laterality: Left;  Left long    MOUTH SURGERY     TOTAL HIP ARTHROPLASTY Right 12/03/2020   Procedure: TOTAL HIP ARTHROPLASTY ANTERIOR APPROACH;  Surgeon: Tarry Kos, MD;  Location: [facility];  Service: Orthopedics;  Laterality: Right;  3-C    Family History  Problem Relation Age of Onset   Hyperlipidemia Father    Hypertension Father    Anxiety disorder Father    Drug abuse Father    Cancer Paternal Grandfather        lung, colon   Colon cancer Paternal Grandfather    Lung cancer Paternal Grandfather    Diabetes Paternal Grandmother    Cancer Paternal Grandmother    Cancer Maternal Grandmother    Cancer Maternal Grandfather    Lung cancer Maternal Grandfather    Stroke Other    Heart disease Other    Depression Paternal Aunt    Anxiety disorder Paternal Aunt    Drug abuse Paternal Uncle    Colon cancer Paternal Uncle    Esophageal cancer Neg Hx    Stomach cancer Neg Hx    Rectal cancer Neg Hx     Allergies  Allergen Reactions   Aspirin Shortness Of Breath and Tinitus    Tinnitus, dizzy, short of breath   Codeine Itching    Reaction to tylenol #3   Penicillins Anaphylaxis and Other (See Comments)    Was told never to take medication.  Has patient had a PCN reaction causing immediate rash, facial/tongue/throat swelling, SOB or  lightheadedness with hypotension: no Has patient had a PCN reaction causing severe rash involving mucus membranes or skin necrosis: no Has patient had a PCN reaction that required hospitalization: no Has patient had a PCN reaction occurring within the last 10  years: no If all of the above answers are "NO", then may proceed with Cephalosporin use.    Tramadol     seizure    Amitriptyline Rash    Current Outpatient Medications on File Prior to Visit  Medication Sig Dispense Refill   diphenhydrAMINE (BENADRYL) 25 MG tablet Take 25 mg by mouth every 6 (six) hours as needed for allergies.     famotidine (PEPCID) 20 MG tablet Take 20 mg by mouth daily as needed for heartburn.     gabapentin (NEURONTIN) 300 MG capsule Take 1 capsule (300 mg total) by mouth 3 (three) times daily. 270 capsule 0   ibuprofen (ADVIL) 800 MG tablet Take 1 tablet by mouth three times daily as needed. 90 tablet 6   ibuprofen (ADVIL) 800 MG tablet Take 1 tablet (800 mg total) by mouth 3 (three) times daily as needed. 90 tablet 6   lidocaine (LIDODERM) 5 % Place 2 patches onto the skin daily. Remove & Discard patch within 12 hours or as directed by MD 60 patch 3   lidocaine (XYLOCAINE) 5 % ointment Apply topically as needed for moderate pain. 50 g 2   Lidocaine 3 % CREA Apply 1 application topically daily as needed (pain).     metoprolol succinate (TOPROL-XL) 25 MG 24 hr tablet Take 1 tablet (25 mg total) by mouth daily. 90 tablet 1   Multiple Vitamin (MULTIVITAMIN WITH MINERALS) TABS tablet Take 1 tablet by mouth daily.     Nerve Stimulator (TENS THERAPY PAIN RELIEF) DEVI 1 application by Does not apply route daily as needed (chronic pain).     nicotine (NICODERM CQ - DOSED IN MG/24 HOURS) 21 mg/24hr patch Place onto the skin as directed 28 patch 0   nicotine polacrilex (NICORETTE) 4 MG gum USE AS DIRECTED 110 each 0   nortriptyline (PAMELOR) 75 MG capsule Take 1 capsule (75 mg total) by mouth at bedtime. 90 capsule 0    ondansetron (ZOFRAN ODT) 4 MG disintegrating tablet Dissolve 1 tablet (4 mg total) by mouth every 8 (eight) hours as needed for nausea or vomiting. 20 tablet 2   oxyCODONE (ROXICODONE) 5 MG immediate release tablet Take 1 tablet (5 mg total) by mouth every 8 (eight) hours. 90 tablet 0   oxyCODONE (ROXICODONE) 5 MG immediate release tablet Take 1 tablet (5 mg total) by mouth every 8 (eight) hours. 90 tablet 0   oxyCODONE (ROXICODONE) 5 MG immediate release tablet Take 1 tablet (5 mg total) by mouth every 8 (eight) hours. 90 tablet 0   pantoprazole (PROTONIX) 40 MG tablet Take 1 tablet twice daily 30 to 60 minutes before breakfast and dinner. 60 tablet 5   No current facility-administered medications on file prior to visit.    BP 120/80   Pulse 100   Temp 97.8 F (36.6 C) (Oral)   Ht 5\' 11"  (1.803 m)   Wt 221 lb (100.2 kg)   SpO2 98%   BMI 30.82 kg/m       Objective:   Physical Exam Vitals and nursing note reviewed.  Constitutional:      Appearance: Normal appearance.  Cardiovascular:     Rate and Rhythm: Normal rate and regular rhythm.     Pulses: Normal pulses.     Heart sounds: Normal heart sounds.  Pulmonary:     Effort: Pulmonary effort is normal.     Breath sounds: Normal breath sounds.  Musculoskeletal:        General: Normal range of motion.  Skin:    General: Skin is warm and dry.  Neurological:     General: No focal deficit present.     Mental Status: He is alert and oriented to person, place, and time.     Gait: Gait abnormal (limping gait with cane).  Psychiatric:        Mood and Affect: Mood normal.        Behavior: Behavior normal.        Thought Content: Thought content normal.        Judgment: Judgment normal.       Assessment & Plan:  1. Chronic pain syndrome - Will add prednisone 10 mg x 5 days to help with joint stiffness.  - Follow up in 3 months  - DRUG MONITOR, PANEL 1, W/CONF, URINE; Future - predniSONE (DELTASONE) 10 MG tablet; Take 1 tablet  (10 mg total) by mouth daily with breakfast.  Dispense: 5 tablet; Refill: 0 - gabapentin (NEURONTIN) 300 MG capsule; Take 1 capsule (300 mg total) by mouth 3 (three) times daily.  Dispense: 270 capsule; Refill: 0 - nortriptyline (PAMELOR) 75 MG capsule; Take 1 capsule (75 mg total) by mouth at bedtime.  Dispense: 90 capsule; Refill: 0 - oxyCODONE (ROXICODONE) 5 MG immediate release tablet; Take 1 tablet (5 mg total) by mouth every 8 (eight) hours.  Dispense: 90 tablet; Refill: 0 - oxyCODONE (ROXICODONE) 5 MG immediate release tablet; Take 1 tablet (5 mg total) by mouth every 8 (eight) hours.  Dispense: 90 tablet; Refill: 0 - oxyCODONE (ROXICODONE) 5 MG immediate release tablet; Take 1 tablet (5 mg total) by mouth every 8 (eight) hours.  Dispense: 90 tablet; Refill: 0  2. Polyarthritis  - DRUG MONITOR, PANEL 1, W/CONF, URINE; Future - predniSONE (DELTASONE) 10 MG tablet; Take 1 tablet (10 mg total) by mouth daily with breakfast.  Dispense: 5 tablet; Refill: 0 - gabapentin (NEURONTIN) 300 MG capsule; Take 1 capsule (300 mg total) by mouth 3 (three) times daily.  Dispense: 270 capsule; Refill: 0 - nortriptyline (PAMELOR) 75 MG capsule; Take 1 capsule (75 mg total) by mouth at bedtime.  Dispense: 90 capsule; Refill: 0 - oxyCODONE (ROXICODONE) 5 MG immediate release tablet; Take 1 tablet (5 mg total) by mouth every 8 (eight) hours.  Dispense: 90 tablet; Refill: 0 - oxyCODONE (ROXICODONE) 5 MG immediate release tablet; Take 1 tablet (5 mg total) by mouth every 8 (eight) hours.  Dispense: 90 tablet; Refill: 0 - oxyCODONE (ROXICODONE) 5 MG immediate release tablet; Take 1 tablet (5 mg total) by mouth every 8 (eight) hours.  Dispense: 90 tablet; Refill: 0  3. Complex regional pain syndrome type 1 of lower extremity, unspecified laterality  - DRUG MONITOR, PANEL 1, W/CONF, URINE; Future - predniSONE (DELTASONE) 10 MG tablet; Take 1 tablet (10 mg total) by mouth daily with breakfast.  Dispense: 5 tablet;  Refill: 0 - gabapentin (NEURONTIN) 300 MG capsule; Take 1 capsule (300 mg total) by mouth 3 (three) times daily.  Dispense: 270 capsule; Refill: 0 - nortriptyline (PAMELOR) 75 MG capsule; Take 1 capsule (75 mg total) by mouth at bedtime.  Dispense: 90 capsule; Refill: 0 - oxyCODONE (ROXICODONE) 5 MG immediate release tablet; Take 1 tablet (5 mg total) by mouth every 8 (eight) hours.  Dispense: 90 tablet; Refill: 0 - oxyCODONE (ROXICODONE) 5 MG immediate release tablet; Take 1 tablet (5 mg total) by mouth every 8 (eight) hours.  Dispense: 90 tablet; Refill: 0 - oxyCODONE (ROXICODONE) 5 MG immediate release tablet; Take 1 tablet (  5 mg total) by mouth every 8 (eight) hours.  Dispense: 90 tablet; Refill: 0  4. Pain management  - predniSONE (DELTASONE) 10 MG tablet; Take 1 tablet (10 mg total) by mouth daily with breakfast.  Dispense: 5 tablet; Refill: 0 - gabapentin (NEURONTIN) 300 MG capsule; Take 1 capsule (300 mg total) by mouth 3 (three) times daily.  Dispense: 270 capsule; Refill: 0 - nortriptyline (PAMELOR) 75 MG capsule; Take 1 capsule (75 mg total) by mouth at bedtime.  Dispense: 90 capsule; Refill: 0 - oxyCODONE (ROXICODONE) 5 MG immediate release tablet; Take 1 tablet (5 mg total) by mouth every 8 (eight) hours.  Dispense: 90 tablet; Refill: 0 - oxyCODONE (ROXICODONE) 5 MG immediate release tablet; Take 1 tablet (5 mg total) by mouth every 8 (eight) hours.  Dispense: 90 tablet; Refill: 0 - oxyCODONE (ROXICODONE) 5 MG immediate release tablet; Take 1 tablet (5 mg total) by mouth every 8 (eight) hours.  Dispense: 90 tablet; Refill: 0  Shirline Frees, NP  Time spent with patient today was 31 minutes which consisted of chart review, discussing pain management, listening, work up, treatment answering questions and documentation.

## 2022-11-14 LAB — DM TEMPLATE

## 2022-11-14 LAB — DRUG MONITOR, PANEL 1, W/CONF, URINE
Amphetamines: NEGATIVE ng/mL (ref <500)
Barbiturates: NEGATIVE ng/mL (ref <300)
Benzodiazepines: NEGATIVE ng/mL (ref <100)
Cocaine Metabolite: NEGATIVE ng/mL (ref <150)
Creatinine: 101.3 mg/dL (ref >=20.0)
EDDP: NEGATIVE ng/mL (ref <100)
Marijuana Metabolite: NEGATIVE ng/mL (ref <20)
Methadone Metabolite: NEGATIVE ng/mL (ref <100)
Methadone: NEGATIVE ng/mL (ref <100)
Opiates: NEGATIVE ng/mL (ref <100)
Oxidant: NEGATIVE ug/mL (ref <200)
Oxycodone: NEGATIVE ng/mL (ref <100)
Phencyclidine: NEGATIVE ng/mL (ref <25)
pH: 6.6 (ref 4.5–9.0)

## 2022-12-07 ENCOUNTER — Telehealth: Payer: 59 | Admitting: Nurse Practitioner

## 2022-12-07 DIAGNOSIS — J329 Chronic sinusitis, unspecified: Secondary | ICD-10-CM

## 2022-12-07 DIAGNOSIS — B9689 Other specified bacterial agents as the cause of diseases classified elsewhere: Secondary | ICD-10-CM

## 2022-12-07 MED ORDER — DOXYCYCLINE HYCLATE 100 MG PO TABS
100.0000 mg | ORAL_TABLET | Freq: Two times a day (BID) | ORAL | 0 refills | Status: AC
Start: 2022-12-07 — End: 2022-12-17

## 2022-12-07 NOTE — Progress Notes (Signed)

## 2022-12-07 NOTE — Progress Notes (Signed)
I have spent 5 minutes in review of e-visit questionnaire, review and updating patient chart, medical decision making and response to patient.  ° °Jerrell Mangel W Secilia Apps, NP ° °  °

## 2022-12-09 ENCOUNTER — Other Ambulatory Visit (HOSPITAL_COMMUNITY): Payer: Self-pay

## 2022-12-10 ENCOUNTER — Other Ambulatory Visit: Payer: Self-pay

## 2022-12-10 ENCOUNTER — Other Ambulatory Visit (HOSPITAL_COMMUNITY): Payer: Self-pay

## 2023-01-08 ENCOUNTER — Other Ambulatory Visit (HOSPITAL_COMMUNITY): Payer: Self-pay

## 2023-01-15 ENCOUNTER — Ambulatory Visit (INDEPENDENT_AMBULATORY_CARE_PROVIDER_SITE_OTHER): Payer: 59 | Admitting: Adult Health

## 2023-01-15 ENCOUNTER — Ambulatory Visit: Payer: 59 | Admitting: Adult Health

## 2023-01-15 VITALS — BP 110/80 | Temp 98.1°F | Ht 71.0 in | Wt 224.0 lb

## 2023-01-15 DIAGNOSIS — G90529 Complex regional pain syndrome I of unspecified lower limb: Secondary | ICD-10-CM | POA: Diagnosis not present

## 2023-01-15 DIAGNOSIS — M130 Polyarthritis, unspecified: Secondary | ICD-10-CM

## 2023-01-15 DIAGNOSIS — Z23 Encounter for immunization: Secondary | ICD-10-CM | POA: Diagnosis not present

## 2023-01-15 DIAGNOSIS — G894 Chronic pain syndrome: Secondary | ICD-10-CM | POA: Diagnosis not present

## 2023-01-15 NOTE — Patient Instructions (Addendum)
It was great seeing you today   Please schedule you CPE anytime

## 2023-01-15 NOTE — Progress Notes (Signed)
Subjective:    Patient ID: Shane Cole., male    DOB: 05-11-1980, 42 y.o.   MRN: [id number]  HPI  42 year old male who  has a past medical history of Allergy, Anxiety, Bipolar disorder (HCC), Bronchitis, Bronchitis, Crush injury lower leg, Depression, Gastric ulcer, GERD (gastroesophageal reflux disease), History of hiatal hernia, Hypertension, Migraine, Nerve pain, and RSD (reflex sympathetic dystrophy).  He presents to the office today for pain management visit   Indication for chronic opioid: Chronic arthritic pain of multiple joints  Medication and dose:Oxycodone 5 mg TID , gabapentin 600 mg TID, Nortriptyline 75 mg QHS, Motrin 800 mg TID.  # pills per month: 90 tabs of oxycodone a month Last UDS date: 02/11/2022 Opioid Treatment Agreement signed: Yes, 02/11/2022 Opioid Treatment Agreement last reviewed with patient: 07/16/2022 [facility] reviewed this encounter (include red flags):  Reviewed and no red flags.    Review of Systems See HPI   Past Medical History:  Diagnosis Date   Allergy    Anxiety    Bipolar disorder (HCC)    Bronchitis    Bronchitis    Crush injury lower leg    Left lower leg   Depression    Gastric ulcer    GERD (gastroesophageal reflux disease)    will awaken him in night   History of hiatal hernia    Hypertension    Migraine    Nerve pain    RSD (reflex sympathetic dystrophy)     Social History   Socioeconomic History   Marital status: Married    Spouse name: Not on file   Number of children: 1   Years of education: 10th grade   Highest education level: Not on file  Occupational History   Occupation: Disabled    Comment: Previously worked Web designer  Tobacco Use   Smoking status: Every Day    Current packs/day: 0.25    Average packs/day: 0.3 packs/day for 23.0 years (5.8 ttl pk-yrs)    Types: Cigarettes   Smokeless tobacco: Never   Tobacco comments:    Using nicotine patch  Vaping Use   Vaping status:  Never Used  Substance and Sexual Activity   Alcohol use: Yes    Alcohol/week: 6.0 standard drinks of alcohol    Types: 6 Cans of beer per week    Comment: occasional   Drug use: No   Sexual activity: Yes    Partners: Female  Other Topics Concern   Not on file  Social History Narrative   05/24/12  Bridget was born and grew up in [place], [place].    He has 2 sisters.    He reports that his childhood was "lousy."    He completed the 10th grade.    He has been married for 5 years, and is currently separated for 3 weeks.    He has a daughter who is 41-1/2 years old.    He has been unemployed for 2 years, and is disabled due to an on-the-job work accident.    He is currently living with his aunt and cousin.    He reports that his hobbies are sports and dogs.    He reports that he is spiritual, but not religious.    He states that his aunt and his father are his social support system.    He denies any current legal problems, but got a DUI in 2007. 05/24/12 [facility]      Social Determinants of Health  Financial Resource Strain: Low Risk  (01/15/2023)   Overall Financial Resource Strain (CARDIA)    Difficulty of Paying Living Expenses: Not very hard  Food Insecurity: No Food Insecurity (01/15/2023)   Hunger Vital Sign    Worried About Running Out of Food in the Last Year: Never true    Ran Out of Food in the Last Year: Never true  Transportation Needs: Unmet Transportation Needs (01/15/2023)   PRAPARE - Transportation    Lack of Transportation (Medical): No    Lack of Transportation (Non-Medical): Yes  Physical Activity: Insufficiently Active (01/15/2023)   Exercise Vital Sign    Days of Exercise per Week: 2 days    Minutes of Exercise per Session: 10 min  Stress: Stress Concern Present (01/15/2023)   [employer] of Occupational Health - Occupational Stress Questionnaire    Feeling of Stress : To some extent  Social Connections: Socially Integrated (01/15/2023)   Social  Connection and Isolation Panel [NHANES]    Frequency of Communication with Friends and Family: More than three times a week    Frequency of Social Gatherings with Friends and Family: Once a week    Attends Religious Services: More than 4 times per year    Active Member of Clubs or Organizations: Yes    Attends Engineer, structural: More than 4 times per year    Marital Status: Married  Catering manager Violence: Not on file    Past Surgical History:  Procedure Laterality Date   CHOLECYSTECTOMY N/A 01/24/2014   Procedure: LAPAROSCOPIC CHOLECYSTECTOMY;  Surgeon: Axel Filler, MD;  Location: [facility];  Service: General;  Laterality: N/A;   COLONOSCOPY     HAND SURGERY     MASS EXCISION Left 01/25/2013   Procedure: EXCISION MASS DORSAL ASPECT LEFT LONG FINGER DISTAL INTERPHALANGEAL JOINT;  Surgeon: Wyn Forster., MD;  Location: [facility];  Service: Orthopedics;  Laterality: Left;  Left long    MOUTH SURGERY     TOTAL HIP ARTHROPLASTY Right 12/03/2020   Procedure: TOTAL HIP ARTHROPLASTY ANTERIOR APPROACH;  Surgeon: Tarry Kos, MD;  Location: [facility];  Service: Orthopedics;  Laterality: Right;  3-C    Family History  Problem Relation Age of Onset   Hyperlipidemia Father    Hypertension Father    Anxiety disorder Father    Drug abuse Father    Cancer Paternal Grandfather        lung, colon   Colon cancer Paternal Grandfather    Lung cancer Paternal Grandfather    Diabetes Paternal Grandmother    Cancer Paternal Grandmother    Cancer Maternal Grandmother    Cancer Maternal Grandfather    Lung cancer Maternal Grandfather    Stroke Other    Heart disease Other    Depression Paternal Aunt    Anxiety disorder Paternal Aunt    Drug abuse Paternal Uncle    Colon cancer Paternal Uncle    Esophageal cancer Neg Hx    Stomach cancer Neg Hx    Rectal cancer Neg Hx     Allergies  Allergen Reactions   Aspirin Shortness Of Breath and Tinitus    Tinnitus,  dizzy, short of breath   Codeine Itching    Reaction to tylenol #3   Penicillins Anaphylaxis and Other (See Comments)    Was told never to take medication.  Has patient had a PCN reaction causing immediate rash, facial/tongue/throat swelling, SOB or lightheadedness with hypotension: no Has patient had a PCN reaction causing  severe rash involving mucus membranes or skin necrosis: no Has patient had a PCN reaction that required hospitalization: no Has patient had a PCN reaction occurring within the last 10 years: no If all of the above answers are "NO", then may proceed with Cephalosporin use.    Tramadol     seizure    Amitriptyline Rash    Current Outpatient Medications on File Prior to Visit  Medication Sig Dispense Refill   diphenhydrAMINE (BENADRYL) 25 MG tablet Take 25 mg by mouth every 6 (six) hours as needed for allergies.     famotidine (PEPCID) 20 MG tablet Take 20 mg by mouth daily as needed for heartburn.     gabapentin (NEURONTIN) 300 MG capsule Take 1 capsule (300 mg total) by mouth 3 (three) times daily. 270 capsule 0   ibuprofen (ADVIL) 800 MG tablet Take 1 tablet by mouth three times daily as needed. 90 tablet 6   ibuprofen (ADVIL) 800 MG tablet Take 1 tablet (800 mg total) by mouth 3 (three) times daily as needed. 90 tablet 6   lidocaine (LIDODERM) 5 % Place 2 patches onto the skin daily. Remove & Discard patch within 12 hours or as directed by MD 60 patch 3   lidocaine (XYLOCAINE) 5 % ointment Apply topically as needed for moderate pain. 50 g 2   Lidocaine 3 % CREA Apply 1 application topically daily as needed (pain).     metoprolol succinate (TOPROL-XL) 25 MG 24 hr tablet Take 1 tablet (25 mg total) by mouth daily. 90 tablet 1   Multiple Vitamin (MULTIVITAMIN WITH MINERALS) TABS tablet Take 1 tablet by mouth daily.     Nerve Stimulator (TENS THERAPY PAIN RELIEF) DEVI 1 application by Does not apply route daily as needed (chronic pain).     nicotine (NICODERM CQ - DOSED  IN MG/24 HOURS) 21 mg/24hr patch Place onto the skin as directed 28 patch 0   nicotine polacrilex (NICORETTE) 4 MG gum USE AS DIRECTED 110 each 0   nortriptyline (PAMELOR) 75 MG capsule Take 1 capsule (75 mg total) by mouth at bedtime. 90 capsule 0   ondansetron (ZOFRAN ODT) 4 MG disintegrating tablet Dissolve 1 tablet (4 mg total) by mouth every 8 (eight) hours as needed for nausea or vomiting. 20 tablet 2   oxyCODONE (ROXICODONE) 5 MG immediate release tablet Take 1 tablet (5 mg total) by mouth every 8 (eight) hours. 90 tablet 0   oxyCODONE (ROXICODONE) 5 MG immediate release tablet Take 1 tablet (5 mg total) by mouth every 8 (eight) hours. 90 tablet 0   oxyCODONE (ROXICODONE) 5 MG immediate release tablet Take 1 tablet (5 mg total) by mouth every 8 (eight) hours. 90 tablet 0   pantoprazole (PROTONIX) 40 MG tablet Take 1 tablet twice daily 30 to 60 minutes before breakfast and dinner. 60 tablet 5   predniSONE (DELTASONE) 10 MG tablet Take 1 tablet (10 mg total) by mouth daily with breakfast. 5 tablet 0   No current facility-administered medications on file prior to visit.    BP 110/80   Temp 98.1 F (36.7 C) (Oral)   Ht 5\' 11"  (1.803 m)   Wt 224 lb (101.6 kg)   BMI 31.24 kg/m       Objective:   Physical Exam Vitals and nursing note reviewed.  Constitutional:      Appearance: Normal appearance.  Cardiovascular:     Rate and Rhythm: Normal rate and regular rhythm.     Pulses: Normal pulses.  Heart sounds: Normal heart sounds.  Pulmonary:     Effort: Pulmonary effort is normal.     Breath sounds: Normal breath sounds.  Musculoskeletal:        General: Normal range of motion.  Skin:    General: Skin is warm and dry.  Neurological:     General: No focal deficit present.     Mental Status: He is alert and oriented to person, place, and time.  Psychiatric:        Mood and Affect: Mood normal.        Behavior: Behavior normal.        Thought Content: Thought content normal.         Judgment: Judgment normal.       Assessment & Plan:  1. Chronic pain syndrome  - DRUG MONITOR, PANEL 1, W/CONF, URINE  2. Polyarthritis  - DRUG MONITOR, PANEL 1, W/CONF, URINE  3. Complex regional pain syndrome type 1 of lower extremity, unspecified laterality  - DRUG MONITOR, PANEL 1, W/CONF, URINE  4. Need for influenza vaccination  - Flu vaccine trivalent PF, 6mos and older(Flulaval,Afluria,Fluarix,Fluzone)  Shirline Frees, NP

## 2023-01-16 ENCOUNTER — Encounter: Payer: Self-pay | Admitting: Adult Health

## 2023-01-16 ENCOUNTER — Ambulatory Visit: Payer: 59 | Admitting: Adult Health

## 2023-01-18 LAB — DRUG MONITOR, PANEL 1, W/CONF, URINE
Amphetamines: NEGATIVE ng/mL (ref <500)
Barbiturates: NEGATIVE ng/mL (ref <300)
Benzodiazepines: NEGATIVE ng/mL (ref <100)
Cocaine Metabolite: NEGATIVE ng/mL (ref <150)
Codeine: 75 ng/mL — ABNORMAL HIGH (ref <50)
Creatinine: 69.7 mg/dL (ref >=20.0)
EDDP: NEGATIVE ng/mL (ref <100)
Hydrocodone: NEGATIVE ng/mL (ref <50)
Hydromorphone: NEGATIVE ng/mL (ref <50)
Marijuana Metabolite: NEGATIVE ng/mL (ref <20)
Methadone Metabolite: NEGATIVE ng/mL (ref <100)
Methadone: NEGATIVE ng/mL (ref <100)
Morphine: NEGATIVE ng/mL (ref <50)
Norhydrocodone: NEGATIVE ng/mL (ref <50)
Noroxycodone: 899 ng/mL — ABNORMAL HIGH (ref <50)
Opiates: POSITIVE ng/mL — AB (ref <100)
Oxidant: NEGATIVE ug/mL (ref <200)
Oxycodone: 779 ng/mL — ABNORMAL HIGH (ref <50)
Oxycodone: POSITIVE ng/mL — AB (ref <100)
Oxymorphone: 390 ng/mL — ABNORMAL HIGH (ref <50)
Phencyclidine: NEGATIVE ng/mL (ref <25)
pH: 5.6 (ref 4.5–9.0)

## 2023-01-18 LAB — DM TEMPLATE

## 2023-02-04 ENCOUNTER — Other Ambulatory Visit: Payer: Self-pay

## 2023-02-04 ENCOUNTER — Other Ambulatory Visit (HOSPITAL_COMMUNITY): Payer: Self-pay

## 2023-02-04 ENCOUNTER — Other Ambulatory Visit: Payer: Self-pay | Admitting: Adult Health

## 2023-02-04 DIAGNOSIS — G894 Chronic pain syndrome: Secondary | ICD-10-CM

## 2023-02-04 DIAGNOSIS — M130 Polyarthritis, unspecified: Secondary | ICD-10-CM

## 2023-02-04 DIAGNOSIS — R52 Pain, unspecified: Secondary | ICD-10-CM

## 2023-02-04 DIAGNOSIS — G90529 Complex regional pain syndrome I of unspecified lower limb: Secondary | ICD-10-CM

## 2023-02-05 ENCOUNTER — Other Ambulatory Visit (HOSPITAL_COMMUNITY): Payer: Self-pay

## 2023-02-05 ENCOUNTER — Other Ambulatory Visit: Payer: Self-pay

## 2023-02-05 MED ORDER — NORTRIPTYLINE HCL 75 MG PO CAPS
75.0000 mg | ORAL_CAPSULE | Freq: Every day | ORAL | 0 refills | Status: DC
  Filled 2023-02-05 – 2023-02-13 (×2): qty 90, 90d supply, fill #0
  Filled 2023-02-13: qty 30, 30d supply, fill #0
  Filled 2023-03-05 – 2023-03-12 (×2): qty 90, 90d supply, fill #0

## 2023-02-05 MED ORDER — GABAPENTIN 300 MG PO CAPS
300.0000 mg | ORAL_CAPSULE | Freq: Three times a day (TID) | ORAL | 0 refills | Status: DC
  Filled 2023-02-05 – 2023-02-13 (×2): qty 270, 90d supply, fill #0
  Filled 2023-02-13: qty 90, 30d supply, fill #0
  Filled 2023-03-05 – 2023-03-12 (×2): qty 270, 90d supply, fill #0

## 2023-02-05 MED ORDER — OXYCODONE HCL 5 MG PO TABS
5.0000 mg | ORAL_TABLET | Freq: Three times a day (TID) | ORAL | 0 refills | Status: DC
  Filled 2023-02-05: qty 90, 30d supply, fill #0

## 2023-02-05 MED ORDER — OXYCODONE HCL 5 MG PO TABS
5.0000 mg | ORAL_TABLET | Freq: Three times a day (TID) | ORAL | 0 refills | Status: DC
  Filled 2023-02-05 – 2023-03-05 (×2): qty 90, 30d supply, fill #0

## 2023-02-05 MED ORDER — OXYCODONE HCL 5 MG PO TABS
5.0000 mg | ORAL_TABLET | Freq: Three times a day (TID) | ORAL | 0 refills | Status: DC
  Filled 2023-02-05 – 2023-04-02 (×2): qty 90, 30d supply, fill #0

## 2023-02-05 NOTE — Telephone Encounter (Signed)
Okay for refill?  

## 2023-02-12 ENCOUNTER — Ambulatory Visit: Payer: 59 | Admitting: Adult Health

## 2023-02-13 ENCOUNTER — Other Ambulatory Visit: Payer: Self-pay

## 2023-02-17 ENCOUNTER — Other Ambulatory Visit (HOSPITAL_COMMUNITY): Payer: Self-pay

## 2023-03-04 ENCOUNTER — Other Ambulatory Visit (HOSPITAL_COMMUNITY): Payer: Self-pay

## 2023-03-05 ENCOUNTER — Other Ambulatory Visit: Payer: Self-pay

## 2023-03-06 ENCOUNTER — Encounter: Payer: 59 | Admitting: Adult Health

## 2023-03-12 ENCOUNTER — Encounter: Payer: Self-pay | Admitting: Adult Health

## 2023-03-12 ENCOUNTER — Ambulatory Visit: Payer: 59 | Admitting: Adult Health

## 2023-03-12 ENCOUNTER — Other Ambulatory Visit (HOSPITAL_COMMUNITY): Payer: Self-pay

## 2023-03-12 ENCOUNTER — Other Ambulatory Visit: Payer: Self-pay

## 2023-03-12 VITALS — BP 120/70 | HR 96 | Temp 97.7°F | Ht 71.0 in | Wt 221.0 lb

## 2023-03-12 DIAGNOSIS — J988 Other specified respiratory disorders: Secondary | ICD-10-CM | POA: Diagnosis not present

## 2023-03-12 MED ORDER — ALBUTEROL SULFATE HFA 108 (90 BASE) MCG/ACT IN AERS
2.0000 | INHALATION_SPRAY | Freq: Four times a day (QID) | RESPIRATORY_TRACT | 0 refills | Status: AC | PRN
  Filled 2023-03-12 (×3): qty 6.7, 25d supply, fill #0

## 2023-03-12 MED ORDER — DOXYCYCLINE HYCLATE 100 MG PO CAPS
100.0000 mg | ORAL_CAPSULE | Freq: Two times a day (BID) | ORAL | 0 refills | Status: DC
  Filled 2023-03-12 (×3): qty 14, 7d supply, fill #0

## 2023-03-12 NOTE — Progress Notes (Addendum)
Subjective:    Patient ID: Shane Cole., male    DOB: [birth year], 42 y.o.   MRN: [id number]  Shortness of Breath  Cough Associated symptoms include shortness of breath.   42 year old male who  has a past medical history of Allergy, Anxiety, Bipolar disorder (HCC), Bronchitis, Bronchitis, Crush injury lower leg, Depression, Gastric ulcer, GERD (gastroesophageal reflux disease), History of hiatal hernia, Hypertension, Migraine, Nerve pain, and RSD (reflex sympathetic dystrophy).  He presents to the office today for an acute issue. He reports that his symptoms started about 8 days ago and have progressed since then. He reports that his symptoms include that of fever up to 101, shortness of breath, wheezing and productive cough and sinus pain and pressure. He has had family member with the same symptoms    Review of Systems  Respiratory:  Positive for cough and shortness of breath.    See HPI   Past Medical History:  Diagnosis Date   Allergy    Anxiety    Bipolar disorder (HCC)    Bronchitis    Bronchitis    Crush injury lower leg    Left lower leg   Depression    Gastric ulcer    GERD (gastroesophageal reflux disease)    will awaken him in night   History of hiatal hernia    Hypertension    Migraine    Nerve pain    RSD (reflex sympathetic dystrophy)     Social History   Socioeconomic History   Marital status: Married    Spouse name: Not on file   Number of children: 1   Years of education: 10th grade   Highest education level: Not on file  Occupational History   Occupation: Disabled    Comment: Previously worked Web designer  Tobacco Use   Smoking status: Every Day    Current packs/day: 0.25    Average packs/day: 0.3 packs/day for 23.0 years (5.8 ttl pk-yrs)    Types: Cigarettes   Smokeless tobacco: Never   Tobacco comments:    Using nicotine patch  Vaping Use   Vaping status: Never Used  Substance and Sexual Activity   Alcohol  use: Yes    Alcohol/week: 6.0 standard drinks of alcohol    Types: 6 Cans of beer per week    Comment: occasional   Drug use: No   Sexual activity: Yes    Partners: Female  Other Topics Concern   Not on file  Social History Narrative   05/24/12  Oluwatimileyin was born and grew up in [place], [place].    He has 2 sisters.    He reports that his childhood was "lousy."    He completed the 10th grade.    He has been married for 5 years, and is currently separated for 3 weeks.    He has a daughter who is 76-1/2 years old.    He has been unemployed for 2 years, and is disabled due to an on-the-job work accident.    He is currently living with his aunt and cousin.    He reports that his hobbies are sports and dogs.    He reports that he is spiritual, but not religious.    He states that his aunt and his father are his social support system.    He denies any current legal problems, but got a DUI in 2007. 05/24/12 [facility]      Social Drivers of Health  Financial Resource Strain: Low Risk  (03/12/2023)   Overall Financial Resource Strain (CARDIA)    Difficulty of Paying Living Expenses: Not very hard  Food Insecurity: No Food Insecurity (03/12/2023)   Hunger Vital Sign    Worried About Running Out of Food in the Last Year: Never true    Ran Out of Food in the Last Year: Never true  Transportation Needs: Unmet Transportation Needs (01/15/2023)   PRAPARE - Transportation    Lack of Transportation (Medical): No    Lack of Transportation (Non-Medical): Yes  Physical Activity: Insufficiently Active (03/12/2023)   Exercise Vital Sign    Days of Exercise per Week: 1 day    Minutes of Exercise per Session: 10 min  Stress: Stress Concern Present (01/15/2023)   [employer] of Occupational Health - Occupational Stress Questionnaire    Feeling of Stress : To some extent  Social Connections: Socially Integrated (03/12/2023)   Social Connection and Isolation Panel [NHANES]    Frequency of  Communication with Friends and Family: Three times a week    Frequency of Social Gatherings with Friends and Family: Once a week    Attends Religious Services: More than 4 times per year    Active Member of Clubs or Organizations: No    Attends Engineer, structural: More than 4 times per year    Marital Status: Married  Catering manager Violence: Not on file    Past Surgical History:  Procedure Laterality Date   CHOLECYSTECTOMY N/A 01/24/2014   Procedure: LAPAROSCOPIC CHOLECYSTECTOMY;  Surgeon: Axel Filler, MD;  Location: [facility];  Service: General;  Laterality: N/A;   COLONOSCOPY     HAND SURGERY     MASS EXCISION Left 01/25/2013   Procedure: EXCISION MASS DORSAL ASPECT LEFT LONG FINGER DISTAL INTERPHALANGEAL JOINT;  Surgeon: Wyn Forster., MD;  Location: [facility];  Service: Orthopedics;  Laterality: Left;  Left long    MOUTH SURGERY     TOTAL HIP ARTHROPLASTY Right 12/03/2020   Procedure: TOTAL HIP ARTHROPLASTY ANTERIOR APPROACH;  Surgeon: Tarry Kos, MD;  Location: [facility];  Service: Orthopedics;  Laterality: Right;  3-C    Family History  Problem Relation Age of Onset   Hyperlipidemia Father    Hypertension Father    Anxiety disorder Father    Drug abuse Father    Cancer Paternal Grandfather        lung, colon   Colon cancer Paternal Grandfather    Lung cancer Paternal Grandfather    Diabetes Paternal Grandmother    Cancer Paternal Grandmother    Cancer Maternal Grandmother    Cancer Maternal Grandfather    Lung cancer Maternal Grandfather    Stroke Other    Heart disease Other    Depression Paternal Aunt    Anxiety disorder Paternal Aunt    Drug abuse Paternal Uncle    Colon cancer Paternal Uncle    Esophageal cancer Neg Hx    Stomach cancer Neg Hx    Rectal cancer Neg Hx     Allergies  Allergen Reactions   Aspirin Shortness Of Breath and Tinitus    Tinnitus, dizzy, short of breath   Codeine Itching    Reaction to tylenol #3    Penicillins Anaphylaxis and Other (See Comments)    Was told never to take medication.  Has patient had a PCN reaction causing immediate rash, facial/tongue/throat swelling, SOB or lightheadedness with hypotension: no Has patient had a PCN reaction causing severe rash  involving mucus membranes or skin necrosis: no Has patient had a PCN reaction that required hospitalization: no Has patient had a PCN reaction occurring within the last 10 years: no If all of the above answers are "NO", then may proceed with Cephalosporin use.    Tramadol     seizure    Amitriptyline Rash    Current Outpatient Medications on File Prior to Visit  Medication Sig Dispense Refill   diphenhydrAMINE (BENADRYL) 25 MG tablet Take 25 mg by mouth every 6 (six) hours as needed for allergies.     famotidine (PEPCID) 20 MG tablet Take 20 mg by mouth daily as needed for heartburn.     gabapentin (NEURONTIN) 300 MG capsule Take 1 capsule (300 mg total) by mouth 3 (three) times daily. 270 capsule 0   ibuprofen (ADVIL) 800 MG tablet Take 1 tablet by mouth three times daily as needed. 90 tablet 6   ibuprofen (ADVIL) 800 MG tablet Take 1 tablet (800 mg total) by mouth 3 (three) times daily as needed. 90 tablet 6   lidocaine (LIDODERM) 5 % Place 2 patches onto the skin daily. Remove & Discard patch within 12 hours or as directed by MD 60 patch 3   lidocaine (XYLOCAINE) 5 % ointment Apply topically as needed for moderate pain. 50 g 2   Lidocaine 3 % CREA Apply 1 application topically daily as needed (pain).     metoprolol succinate (TOPROL-XL) 25 MG 24 hr tablet Take 1 tablet (25 mg total) by mouth daily. 90 tablet 1   Multiple Vitamin (MULTIVITAMIN WITH MINERALS) TABS tablet Take 1 tablet by mouth daily.     Nerve Stimulator (TENS THERAPY PAIN RELIEF) DEVI 1 application by Does not apply route daily as needed (chronic pain).     nicotine (NICODERM CQ - DOSED IN MG/24 HOURS) 21 mg/24hr patch Place onto the skin as directed 28  patch 0   nicotine polacrilex (NICORETTE) 4 MG gum USE AS DIRECTED 110 each 0   nortriptyline (PAMELOR) 75 MG capsule Take 1 capsule (75 mg total) by mouth at bedtime. 90 capsule 0   ondansetron (ZOFRAN ODT) 4 MG disintegrating tablet Dissolve 1 tablet (4 mg total) by mouth every 8 (eight) hours as needed for nausea or vomiting. 20 tablet 2   oxyCODONE (ROXICODONE) 5 MG immediate release tablet Take 1 tablet (5 mg total) by mouth every 8 (eight) hours. 90 tablet 0   oxyCODONE (ROXICODONE) 5 MG immediate release tablet Take 1 tablet (5 mg total) by mouth every 8 (eight) hours. 90 tablet 0   oxyCODONE (ROXICODONE) 5 MG immediate release tablet Take 1 tablet (5 mg total) by mouth every 8 (eight) hours. 90 tablet 0   pantoprazole (PROTONIX) 40 MG tablet Take 1 tablet twice daily 30 to 60 minutes before breakfast and dinner. 60 tablet 5   predniSONE (DELTASONE) 10 MG tablet Take 1 tablet (10 mg total) by mouth daily with breakfast. 5 tablet 0   No current facility-administered medications on file prior to visit.    BP 120/70   Pulse 96   Temp 97.7 F (36.5 C) (Oral)   Ht 5\' 11"  (1.803 m)   Wt 221 lb (100.2 kg)   SpO2 99%   BMI 30.82 kg/m       Objective:   Physical Exam Vitals and nursing note reviewed.  Constitutional:      Appearance: Normal appearance.  HENT:     Nose: No rhinorrhea.     Right Turbinates: Not  enlarged or swollen.     Left Turbinates: Not enlarged or swollen.     Right Sinus: No maxillary sinus tenderness or frontal sinus tenderness.     Left Sinus: No maxillary sinus tenderness or frontal sinus tenderness.  Cardiovascular:     Rate and Rhythm: Normal rate and regular rhythm.     Pulses: Normal pulses.  Pulmonary:     Effort: Pulmonary effort is normal.     Breath sounds: Examination of the right-upper field reveals wheezing. Examination of the left-upper field reveals wheezing. Examination of the right-middle field reveals wheezing. Examination of the  left-middle field reveals wheezing. Examination of the right-lower field reveals wheezing. Examination of the left-lower field reveals wheezing. Wheezing present. No rhonchi or rales.  Musculoskeletal:        General: Normal range of motion.  Skin:    General: Skin is warm and dry.  Neurological:     General: No focal deficit present.     Mental Status: He is alert and oriented to person, place, and time.  Psychiatric:        Mood and Affect: Mood normal.        Behavior: Behavior normal.        Thought Content: Thought content normal.        Judgment: Judgment normal.       Assessment & Plan:  1. Respiratory infection (Primary) - Will treat due to symptoms and duration as well as smoking history.  - Follow up if not resolved by the end of antibiotics or sooner if symptoms worsen  - doxycycline (VIBRAMYCIN) 100 MG capsule; Take 1 capsule (100 mg total) by mouth 2 (two) times daily.  Dispense: 14 capsule; Refill: 0 - albuterol (VENTOLIN HFA) 108 (90 Base) MCG/ACT inhaler; Inhale 2 puffs into the lungs every 6 (six) hours as needed for wheezing or shortness of breath.  Dispense: 8 g; Refill: 0   Shirline Frees, NP

## 2023-03-13 ENCOUNTER — Encounter: Payer: 59 | Admitting: Adult Health

## 2023-04-02 ENCOUNTER — Other Ambulatory Visit: Payer: Self-pay

## 2023-04-02 ENCOUNTER — Other Ambulatory Visit (HOSPITAL_COMMUNITY): Payer: Self-pay

## 2023-04-03 ENCOUNTER — Other Ambulatory Visit (HOSPITAL_COMMUNITY): Payer: Self-pay

## 2023-04-10 ENCOUNTER — Encounter: Payer: 59 | Admitting: Adult Health

## 2023-04-10 ENCOUNTER — Encounter: Payer: Self-pay | Admitting: Adult Health

## 2023-04-10 NOTE — Telephone Encounter (Signed)
FYI

## 2023-04-10 NOTE — Progress Notes (Deleted)
Subjective:    Patient ID: Shane Cole., male    DOB: April 14, 1980, 43 y.o.   MRN: [id number]  HPI Patient presents for yearly preventative medicine examination. He is a pleasant 42 year old male who  has a past medical history of Allergy, Anxiety, Bipolar disorder (HCC), Bronchitis, Bronchitis, Crush injury lower leg, Depression, Gastric ulcer, GERD (gastroesophageal reflux disease), History of hiatal hernia, Hypertension, Migraine, Nerve pain, and RSD (reflex sympathetic dystrophy).  Chronic Pain Syndrome -he has a crush injury to his left foot from around 2005 chronic low back pain, chronic pain in his right shoulder.  He has had a history of right total hip replacement in September 2022 due to avascular necrosis.  He does have issues with neuropathy.  He is currently managed with oxycodone 5 mg 3 times daily, gabapentin 600 mg 3 times daily, nortriptyline 75 mg nightly, Motrin 800 mg 3 times daily as needed.  All immunizations and health maintenance protocols were reviewed with the patient and needed orders were placed.  Appropriate screening laboratory values were ordered for the patient including screening of hyperlipidemia, renal function and hepatic function. If indicated by BPH, a PSA was ordered.  Medication reconciliation,  past medical history, social history, problem list and allergies were reviewed in detail with the patient  Goals were established with regard to weight loss, exercise, and  diet in compliance with medications Wt Readings from Last 3 Encounters:  03/12/23 221 lb (100.2 kg)  01/15/23 224 lb (101.6 kg)  11/11/22 221 lb (100.2 kg)    Review of Systems  Constitutional: Negative.   HENT: Negative.    Eyes: Negative.   Respiratory: Negative.    Cardiovascular: Negative.   Gastrointestinal: Negative.   Endocrine: Negative.   Genitourinary: Negative.   Musculoskeletal:  Positive for arthralgias, back pain and gait problem.  Skin: Negative.    Allergic/Immunologic: Negative.   Hematological: Negative.   Psychiatric/Behavioral: Negative.    All other systems reviewed and are negative.  Past Medical History:  Diagnosis Date   Allergy    Anxiety    Bipolar disorder (HCC)    Bronchitis    Bronchitis    Crush injury lower leg    Left lower leg   Depression    Gastric ulcer    GERD (gastroesophageal reflux disease)    will awaken him in night   History of hiatal hernia    Hypertension    Migraine    Nerve pain    RSD (reflex sympathetic dystrophy)     Social History   Socioeconomic History   Marital status: Married    Spouse name: Not on file   Number of children: 1   Years of education: 10th grade   Highest education level: Not on file  Occupational History   Occupation: Disabled    Comment: Previously worked Web designer  Tobacco Use   Smoking status: Every Day    Current packs/day: 0.25    Average packs/day: 0.3 packs/day for 23.0 years (5.8 ttl pk-yrs)    Types: Cigarettes   Smokeless tobacco: Never   Tobacco comments:    Using nicotine patch  Vaping Use   Vaping status: Never Used  Substance and Sexual Activity   Alcohol use: Yes    Alcohol/week: 6.0 standard drinks of alcohol    Types: 6 Cans of beer per week    Comment: occasional   Drug use: No   Sexual activity: Yes    Partners: Female  Other Topics Concern   Not on file  Social History Narrative   05/24/12  Shane Cole was born and grew up in [place], [place].    He has 2 sisters.    He reports that his childhood was "lousy."    He completed the 10th grade.    He has been married for 5 years, and is currently separated for 3 weeks.    He has a daughter who is 60-1/2 years old.    He has been unemployed for 2 years, and is disabled due to an on-the-job work accident.    He is currently living with his aunt and cousin.    He reports that his hobbies are sports and dogs.    He reports that he is spiritual, but not  religious.    He states that his aunt and his father are his social support system.    He denies any current legal problems, but got a DUI in 2007. 05/24/12 [facility]      Social Drivers of Health   Financial Resource Strain: Low Risk  (03/12/2023)   Overall Financial Resource Strain (CARDIA)    Difficulty of Paying Living Expenses: Not very hard  Food Insecurity: No Food Insecurity (03/12/2023)   Hunger Vital Sign    Worried About Running Out of Food in the Last Year: Never true    Ran Out of Food in the Last Year: Never true  Transportation Needs: Unmet Transportation Needs (01/15/2023)   PRAPARE - Transportation    Lack of Transportation (Medical): No    Lack of Transportation (Non-Medical): Yes  Physical Activity: Insufficiently Active (03/12/2023)   Exercise Vital Sign    Days of Exercise per Week: 1 day    Minutes of Exercise per Session: 10 min  Stress: Stress Concern Present (01/15/2023)   [employer] of Occupational Health - Occupational Stress Questionnaire    Feeling of Stress : To some extent  Social Connections: Socially Integrated (03/12/2023)   Social Connection and Isolation Panel [NHANES]    Frequency of Communication with Friends and Family: Three times a week    Frequency of Social Gatherings with Friends and Family: Once a week    Attends Religious Services: More than 4 times per year    Active Member of Clubs or Organizations: No    Attends Engineer, structural: More than 4 times per year    Marital Status: Married  Catering manager Violence: Not on file    Past Surgical History:  Procedure Laterality Date   CHOLECYSTECTOMY N/A 01/24/2014   Procedure: LAPAROSCOPIC CHOLECYSTECTOMY;  Surgeon: Axel Filler, MD;  Location: [facility] OR;  Service: General;  Laterality: N/A;   COLONOSCOPY     HAND SURGERY     MASS EXCISION Left 01/25/2013   Procedure: EXCISION MASS DORSAL ASPECT LEFT LONG FINGER DISTAL INTERPHALANGEAL JOINT;  Surgeon: Wyn Forster.,  MD;  Location:  [facility];  Service: Orthopedics;  Laterality: Left;  Left long    MOUTH SURGERY     TOTAL HIP ARTHROPLASTY Right 12/03/2020   Procedure: TOTAL HIP ARTHROPLASTY ANTERIOR APPROACH;  Surgeon: Tarry Kos, MD;  Location: [facility] OR;  Service: Orthopedics;  Laterality: Right;  3-C    Family History  Problem Relation Age of Onset   Hyperlipidemia Father    Hypertension Father    Anxiety disorder Father    Drug abuse Father    Cancer Paternal Grandfather        lung, colon  Colon cancer Paternal Grandfather    Lung cancer Paternal Grandfather    Diabetes Paternal Grandmother    Cancer Paternal Grandmother    Cancer Maternal Grandmother    Cancer Maternal Grandfather    Lung cancer Maternal Grandfather    Stroke Other    Heart disease Other    Depression Paternal Aunt    Anxiety disorder Paternal Aunt    Drug abuse Paternal Uncle    Colon cancer Paternal Uncle    Esophageal cancer Neg Hx    Stomach cancer Neg Hx    Rectal cancer Neg Hx     Allergies  Allergen Reactions   Aspirin Shortness Of Breath and Tinitus    Tinnitus, dizzy, short of breath   Codeine Itching    Reaction to tylenol #3   Penicillins Anaphylaxis and Other (See Comments)    Was told never to take medication.  Has patient had a PCN reaction causing immediate rash, facial/tongue/throat swelling, SOB or lightheadedness with hypotension: no Has patient had a PCN reaction causing severe rash involving mucus membranes or skin necrosis: no Has patient had a PCN reaction that required hospitalization: no Has patient had a PCN reaction occurring within the last 10 years: no If all of the above answers are "NO", then may proceed with Cephalosporin use.    Tramadol     seizure    Amitriptyline Rash    Current Outpatient Medications on File Prior to Visit  Medication Sig Dispense Refill   albuterol (VENTOLIN HFA) 108 (90 Base) MCG/ACT inhaler Inhale 2 puffs into the lungs every 6 (six)  hours as needed for wheezing or shortness of breath. 6.7 g 0   famotidine (PEPCID) 20 MG tablet Take 20 mg by mouth daily as needed for heartburn.     gabapentin (NEURONTIN) 300 MG capsule Take 1 capsule (300 mg total) by mouth 3 (three) times daily. 270 capsule 0   ibuprofen (ADVIL) 800 MG tablet Take 1 tablet (800 mg total) by mouth 3 (three) times daily as needed. 90 tablet 6   lidocaine (LIDODERM) 5 % Place 2 patches onto the skin daily. Remove & Discard patch within 12 hours or as directed by MD 60 patch 3   lidocaine (XYLOCAINE) 5 % ointment Apply topically as needed for moderate pain. 50 g 2   Lidocaine 3 % CREA Apply 1 application topically daily as needed (pain).     metoprolol succinate (TOPROL-XL) 25 MG 24 hr tablet Take 1 tablet (25 mg total) by mouth daily. 90 tablet 1   Multiple Vitamin (MULTIVITAMIN WITH MINERALS) TABS tablet Take 1 tablet by mouth daily.     Nerve Stimulator (TENS THERAPY PAIN RELIEF) DEVI 1 application by Does not apply route daily as needed (chronic pain).     nicotine (NICODERM CQ - DOSED IN MG/24 HOURS) 21 mg/24hr patch Place onto the skin as directed 28 patch 0   nicotine polacrilex (NICORETTE) 4 MG gum USE AS DIRECTED 110 each 0   nortriptyline (PAMELOR) 75 MG capsule Take 1 capsule (75 mg total) by mouth at bedtime. 90 capsule 0   oxyCODONE (ROXICODONE) 5 MG immediate release tablet Take 1 tablet (5 mg total) by mouth every 8 (eight) hours. 90 tablet 0   oxyCODONE (ROXICODONE) 5 MG immediate release tablet Take 1 tablet (5 mg total) by mouth every 8 (eight) hours. 90 tablet 0   oxyCODONE (ROXICODONE) 5 MG immediate release tablet Take 1 tablet (5 mg total) by mouth every 8 (eight) hours. 90  tablet 0   No current facility-administered medications on file prior to visit.    There were no vitals taken for this visit.      Objective:   Physical Exam Vitals and nursing note reviewed.  Constitutional:      General: He is not in acute distress.     Appearance: Normal appearance. He is not ill-appearing.  HENT:     Head: Normocephalic and atraumatic.     Right Ear: Tympanic membrane, ear canal and external ear normal. There is no impacted cerumen.     Left Ear: Tympanic membrane, ear canal and external ear normal. There is no impacted cerumen.     Nose: Nose normal. No congestion or rhinorrhea.     Mouth/Throat:     Mouth: Mucous membranes are moist.     Pharynx: Oropharynx is clear.  Eyes:     Extraocular Movements: Extraocular movements intact.     Conjunctiva/sclera: Conjunctivae normal.     Pupils: Pupils are equal, round, and reactive to light.  Neck:     Vascular: No carotid bruit.  Cardiovascular:     Rate and Rhythm: Normal rate and regular rhythm.     Pulses: Normal pulses.     Heart sounds: No murmur heard.    No friction rub. No gallop.  Pulmonary:     Effort: Pulmonary effort is normal.     Breath sounds: Normal breath sounds.  Abdominal:     General: Abdomen is flat. Bowel sounds are normal. There is no distension.     Palpations: Abdomen is soft. There is no mass.     Tenderness: There is no abdominal tenderness. There is no guarding or rebound.     Hernia: No hernia is present.  Musculoskeletal:        General: Normal range of motion.     Cervical back: Normal range of motion and neck supple.  Lymphadenopathy:     Cervical: No cervical adenopathy.  Skin:    General: Skin is warm and dry.     Capillary Refill: Capillary refill takes less than 2 seconds.  Neurological:     General: No focal deficit present.     Mental Status: He is alert and oriented to person, place, and time.  Psychiatric:        Mood and Affect: Mood normal.        Behavior: Behavior normal.        Thought Content: Thought content normal.        Judgment: Judgment normal.           Assessment & Plan:

## 2023-04-20 ENCOUNTER — Other Ambulatory Visit (HOSPITAL_COMMUNITY): Payer: Self-pay

## 2023-04-24 ENCOUNTER — Ambulatory Visit: Payer: 59 | Admitting: Family Medicine

## 2023-05-01 ENCOUNTER — Other Ambulatory Visit: Payer: Self-pay | Admitting: Adult Health

## 2023-05-01 ENCOUNTER — Other Ambulatory Visit (HOSPITAL_COMMUNITY): Payer: Self-pay

## 2023-05-01 DIAGNOSIS — M130 Polyarthritis, unspecified: Secondary | ICD-10-CM

## 2023-05-01 DIAGNOSIS — G894 Chronic pain syndrome: Secondary | ICD-10-CM

## 2023-05-01 DIAGNOSIS — R52 Pain, unspecified: Secondary | ICD-10-CM

## 2023-05-01 DIAGNOSIS — G90529 Complex regional pain syndrome I of unspecified lower limb: Secondary | ICD-10-CM

## 2023-05-01 NOTE — Telephone Encounter (Signed)
 Okay for refill?

## 2023-05-07 ENCOUNTER — Other Ambulatory Visit (HOSPITAL_COMMUNITY): Payer: Self-pay

## 2023-05-07 ENCOUNTER — Other Ambulatory Visit: Payer: Self-pay

## 2023-05-07 ENCOUNTER — Other Ambulatory Visit: Payer: Self-pay | Admitting: Adult Health

## 2023-05-07 ENCOUNTER — Ambulatory Visit (INDEPENDENT_AMBULATORY_CARE_PROVIDER_SITE_OTHER): Payer: 59 | Admitting: Adult Health

## 2023-05-07 ENCOUNTER — Encounter: Payer: Self-pay | Admitting: Adult Health

## 2023-05-07 VITALS — BP 110/78 | HR 82 | Temp 98.0°F | Ht 71.0 in | Wt 227.0 lb

## 2023-05-07 DIAGNOSIS — E782 Mixed hyperlipidemia: Secondary | ICD-10-CM | POA: Diagnosis not present

## 2023-05-07 DIAGNOSIS — K219 Gastro-esophageal reflux disease without esophagitis: Secondary | ICD-10-CM

## 2023-05-07 DIAGNOSIS — G90529 Complex regional pain syndrome I of unspecified lower limb: Secondary | ICD-10-CM | POA: Diagnosis not present

## 2023-05-07 DIAGNOSIS — M130 Polyarthritis, unspecified: Secondary | ICD-10-CM | POA: Diagnosis not present

## 2023-05-07 DIAGNOSIS — Z720 Tobacco use: Secondary | ICD-10-CM | POA: Diagnosis not present

## 2023-05-07 DIAGNOSIS — R52 Pain, unspecified: Secondary | ICD-10-CM

## 2023-05-07 DIAGNOSIS — G894 Chronic pain syndrome: Secondary | ICD-10-CM | POA: Diagnosis not present

## 2023-05-07 DIAGNOSIS — Z0000 Encounter for general adult medical examination without abnormal findings: Secondary | ICD-10-CM | POA: Diagnosis not present

## 2023-05-07 DIAGNOSIS — R7989 Other specified abnormal findings of blood chemistry: Secondary | ICD-10-CM

## 2023-05-07 LAB — COMPREHENSIVE METABOLIC PANEL
ALT: 28 U/L (ref 0–53)
AST: 21 U/L (ref 0–37)
Albumin: 4.6 g/dL (ref 3.5–5.2)
Alkaline Phosphatase: 92 U/L (ref 39–117)
BUN: 19 mg/dL (ref 6–23)
CO2: 30 meq/L (ref 19–32)
Calcium: 9.1 mg/dL (ref 8.4–10.5)
Chloride: 99 meq/L (ref 96–112)
Creatinine, Ser: 0.96 mg/dL (ref 0.40–1.50)
GFR: 96.68 mL/min (ref >60.00)
Glucose, Bld: 82 mg/dL (ref 70–99)
Potassium: 4 meq/L (ref 3.5–5.1)
Sodium: 138 meq/L (ref 135–145)
Total Bilirubin: 0.4 mg/dL (ref 0.2–1.2)
Total Protein: 7.4 g/dL (ref 6.0–8.3)

## 2023-05-07 LAB — CBC
HCT: 46.9 % (ref 39.0–52.0)
Hemoglobin: 15.5 g/dL (ref 13.0–17.0)
MCHC: 33 g/dL (ref 30.0–36.0)
MCV: 91.5 fL (ref 78.0–100.0)
Platelets: 275 10*3/uL (ref 150.0–400.0)
RBC: 5.12 Mil/uL (ref 4.22–5.81)
RDW: 13.2 % (ref 11.5–15.5)
WBC: 7.2 10*3/uL (ref 4.0–10.5)

## 2023-05-07 LAB — LIPID PANEL
Cholesterol: 192 mg/dL (ref 0–200)
HDL: 71.1 mg/dL (ref >39.00)
LDL Cholesterol: 69 mg/dL (ref 0–99)
NonHDL: 121.22
Total CHOL/HDL Ratio: 3
Triglycerides: 261 mg/dL — ABNORMAL HIGH (ref 0.0–149.0)
VLDL: 52.2 mg/dL — ABNORMAL HIGH (ref 0.0–40.0)

## 2023-05-07 LAB — TSH: TSH: 7.24 u[IU]/mL — ABNORMAL HIGH (ref 0.35–5.50)

## 2023-05-07 MED ORDER — OXYCODONE HCL 5 MG PO TABS
5.0000 mg | ORAL_TABLET | Freq: Three times a day (TID) | ORAL | 0 refills | Status: DC
  Filled 2023-05-07 – 2023-06-05 (×2): qty 90, 30d supply, fill #0

## 2023-05-07 MED ORDER — PREDNISONE 10 MG PO TABS
10.0000 mg | ORAL_TABLET | Freq: Every day | ORAL | 0 refills | Status: DC
  Filled 2023-05-07: qty 5, 5d supply, fill #0

## 2023-05-07 MED ORDER — NICOTINE 14 MG/24HR TD PT24
14.0000 mg | MEDICATED_PATCH | Freq: Every day | TRANSDERMAL | 0 refills | Status: DC
  Filled 2023-05-07: qty 28, 28d supply, fill #0

## 2023-05-07 MED ORDER — OXYCODONE HCL 5 MG PO TABS
5.0000 mg | ORAL_TABLET | Freq: Three times a day (TID) | ORAL | 0 refills | Status: DC
  Filled 2023-05-07 – 2023-07-05 (×2): qty 90, 30d supply, fill #0

## 2023-05-07 MED ORDER — OXYCODONE HCL 5 MG PO TABS
5.0000 mg | ORAL_TABLET | Freq: Three times a day (TID) | ORAL | 0 refills | Status: DC
  Filled 2023-05-07: qty 81, 27d supply, fill #0
  Filled 2023-05-07: qty 9, 3d supply, fill #0

## 2023-05-07 MED ORDER — GABAPENTIN 300 MG PO CAPS
300.0000 mg | ORAL_CAPSULE | Freq: Three times a day (TID) | ORAL | 0 refills | Status: DC
  Filled 2023-05-07 – 2023-07-05 (×2): qty 270, 90d supply, fill #0

## 2023-05-07 MED ORDER — NORTRIPTYLINE HCL 75 MG PO CAPS
75.0000 mg | ORAL_CAPSULE | Freq: Every day | ORAL | 0 refills | Status: AC
  Filled 2023-05-07 – 2023-07-05 (×2): qty 90, 90d supply, fill #0

## 2023-05-07 NOTE — Progress Notes (Addendum)
Subjective:    Patient ID: Shane Cole., male    DOB: [birth year], 43 y.o.   MRN: [id number]  HPI  Patient presents for yearly preventative medicine examination. He is a pleasant 43 year old male who  has a past medical history of Allergy, Anxiety, Bipolar disorder (HCC), Bronchitis, Bronchitis, Crush injury lower leg, Depression, Gastric ulcer, GERD (gastroesophageal reflux disease), History of hiatal hernia, Hypertension, Migraine, Nerve pain, and RSD (reflex sympathetic dystrophy).  Chronic Pain Syndrome -he has a crush injury to his left foot from around 2005 chronic low back pain, chronic pain in his right shoulder.  He has had a history of right total hip replacement in September 2022 due to avascular necrosis.  He does have issues with neuropathy.  He is currently managed with oxycodone 5 mg 3 times daily, gabapentin 600 mg 3 times daily, nortriptyline 75 mg nightly, Motrin 800 mg 3 times daily as needed. Does not currently feel like it pain is adequally controlled.   GERD- uses Pepcid 20 mg daily   Tobacco Use - Vaping now but still smokes cigarettes on occasion.    Hyperlipidemia -not currently on medication  Lab Results  Component Value Date   CHOL 220 (H) 07/03/2020   HDL 83.80 07/03/2020   LDLCALC 119 (H) 07/03/2020   TRIG 82.0 07/03/2020   CHOLHDL 3 07/03/2020    All immunizations and health maintenance protocols were reviewed with the patient and needed orders were placed.  Appropriate screening laboratory values were ordered for the patient including screening of hyperlipidemia, renal function and hepatic function.  Medication reconciliation,  past medical history, social history, problem list and allergies were reviewed in detail with the patient  Goals were established with regard to weight loss, exercise, and  diet in compliance with medications  Wt Readings from Last 3 Encounters:  05/07/23 227 lb (103 kg)  03/12/23 221 lb (100.2 kg)  01/15/23 224 lb  (101.6 kg)    Review of Systems  Constitutional: Negative.   HENT: Negative.    Eyes: Negative.   Respiratory: Negative.    Cardiovascular: Negative.   Gastrointestinal: Negative.   Endocrine: Negative.   Genitourinary: Negative.   Musculoskeletal:  Positive for arthralgias, back pain and gait problem.  Skin: Negative.   Allergic/Immunologic: Negative.   Hematological: Negative.   Psychiatric/Behavioral: Negative.    All other systems reviewed and are negative.  Past Medical History:  Diagnosis Date   Allergy    Anxiety    Bipolar disorder (HCC)    Bronchitis    Bronchitis    Crush injury lower leg    Left lower leg   Depression    Gastric ulcer    GERD (gastroesophageal reflux disease)    will awaken him in night   History of hiatal hernia    Hypertension    Migraine    Nerve pain    RSD (reflex sympathetic dystrophy)     Social History   Socioeconomic History   Marital status: Married    Spouse name: Not on file   Number of children: 1   Years of education: 10th grade   Highest education level: Not on file  Occupational History   Occupation: Disabled    Comment: Previously worked Web designer  Tobacco Use   Smoking status: Every Day    Current packs/day: 0.25    Average packs/day: 0.3 packs/day for 23.0 years (5.8 ttl pk-yrs)    Types: Cigarettes   Smokeless tobacco: Never  Tobacco comments:    Using nicotine patch  Vaping Use   Vaping status: Never Used  Substance and Sexual Activity   Alcohol use: Yes    Alcohol/week: 6.0 standard drinks of alcohol    Types: 6 Cans of beer per week    Comment: occasional   Drug use: No   Sexual activity: Yes    Partners: Female  Other Topics Concern   Not on file  Social History Narrative   05/24/12  Shane Cole was born and grew up in [place], [place].    He has 2 sisters.    He reports that his childhood was "lousy."    He completed the 10th grade.    He has been married for 5 years, and  is currently separated for 3 weeks.    He has a daughter who is 67-1/2 years old.    He has been unemployed for 2 years, and is disabled due to an on-the-job work accident.    He is currently living with his aunt and cousin.    He reports that his hobbies are sports and dogs.    He reports that he is spiritual, but not religious.    He states that his aunt and his father are his social support system.    He denies any current legal problems, but got a DUI in 2007. 05/24/12 [facility]      Social Drivers of Health   Financial Resource Strain: Low Risk  (03/12/2023)   Overall Financial Resource Strain (CARDIA)    Difficulty of Paying Living Expenses: Not very hard  Food Insecurity: No Food Insecurity (03/12/2023)   Hunger Vital Sign    Worried About Running Out of Food in the Last Year: Never true    Ran Out of Food in the Last Year: Never true  Transportation Needs: Unmet Transportation Needs (01/15/2023)   PRAPARE - Transportation    Lack of Transportation (Medical): No    Lack of Transportation (Non-Medical): Yes  Physical Activity: Insufficiently Active (03/12/2023)   Exercise Vital Sign    Days of Exercise per Week: 1 day    Minutes of Exercise per Session: 10 min  Stress: Stress Concern Present (01/15/2023)   [employer] of Occupational Health - Occupational Stress Questionnaire    Feeling of Stress : To some extent  Social Connections: Socially Integrated (03/12/2023)   Social Connection and Isolation Panel [NHANES]    Frequency of Communication with Friends and Family: Three times a week    Frequency of Social Gatherings with Friends and Family: Once a week    Attends Religious Services: More than 4 times per year    Active Member of Clubs or Organizations: No    Attends Engineer, structural: More than 4 times per year    Marital Status: Married  Catering manager Violence: Not on file    Past Surgical History:  Procedure Laterality Date   CHOLECYSTECTOMY N/A  01/24/2014   Procedure: LAPAROSCOPIC CHOLECYSTECTOMY;  Surgeon: Axel Filler, MD;  Location: [facility] OR;  Service: General;  Laterality: N/A;   COLONOSCOPY     HAND SURGERY     MASS EXCISION Left 01/25/2013   Procedure: EXCISION MASS DORSAL ASPECT LEFT LONG FINGER DISTAL INTERPHALANGEAL JOINT;  Surgeon: Wyn Forster., MD;  Location: [facility];  Service: Orthopedics;  Laterality: Left;  Left long    MOUTH SURGERY     TOTAL HIP ARTHROPLASTY Right 12/03/2020   Procedure: TOTAL HIP ARTHROPLASTY ANTERIOR APPROACH;  Surgeon: Tarry Kos, MD;  Location: [facility];  Service: Orthopedics;  Laterality: Right;  3-C    Family History  Problem Relation Age of Onset   Hyperlipidemia Father    Hypertension Father    Anxiety disorder Father    Drug abuse Father    Cancer Paternal Grandfather        lung, colon   Colon cancer Paternal Grandfather    Lung cancer Paternal Grandfather    Diabetes Paternal Grandmother    Cancer Paternal Grandmother    Cancer Maternal Grandmother    Cancer Maternal Grandfather    Lung cancer Maternal Grandfather    Stroke Other    Heart disease Other    Depression Paternal Aunt    Anxiety disorder Paternal Aunt    Drug abuse Paternal Uncle    Colon cancer Paternal Uncle    Esophageal cancer Neg Hx    Stomach cancer Neg Hx    Rectal cancer Neg Hx     Allergies  Allergen Reactions   Aspirin Shortness Of Breath and Tinitus    Tinnitus, dizzy, short of breath   Codeine Itching    Reaction to tylenol #3   Penicillins Anaphylaxis and Other (See Comments)    Was told never to take medication.  Has patient had a PCN reaction causing immediate rash, facial/tongue/throat swelling, SOB or lightheadedness with hypotension: no Has patient had a PCN reaction causing severe rash involving mucus membranes or skin necrosis: no Has patient had a PCN reaction that required hospitalization: no Has patient had a PCN reaction occurring within the last 10 years:  no If all of the above answers are "NO", then may proceed with Cephalosporin use.    Tramadol     seizure    Amitriptyline Rash    Current Outpatient Medications on File Prior to Visit  Medication Sig Dispense Refill   albuterol (VENTOLIN HFA) 108 (90 Base) MCG/ACT inhaler Inhale 2 puffs into the lungs every 6 (six) hours as needed for wheezing or shortness of breath. 6.7 g 0   famotidine (PEPCID) 20 MG tablet Take 20 mg by mouth daily as needed for heartburn.     gabapentin (NEURONTIN) 300 MG capsule Take 1 capsule (300 mg total) by mouth 3 (three) times daily. 270 capsule 0   ibuprofen (ADVIL) 800 MG tablet Take 1 tablet (800 mg total) by mouth 3 (three) times daily as needed. 90 tablet 6   lidocaine (LIDODERM) 5 % Place 2 patches onto the skin daily. Remove & Discard patch within 12 hours or as directed by MD 60 patch 3   lidocaine (XYLOCAINE) 5 % ointment Apply topically as needed for moderate pain. 50 g 2   Lidocaine 3 % CREA Apply 1 application topically daily as needed (pain).     Multiple Vitamin (MULTIVITAMIN WITH MINERALS) TABS tablet Take 1 tablet by mouth daily.     Nerve Stimulator (TENS THERAPY PAIN RELIEF) DEVI 1 application by Does not apply route daily as needed (chronic pain).     nicotine polacrilex (NICORETTE) 4 MG gum USE AS DIRECTED 110 each 0   nortriptyline (PAMELOR) 75 MG capsule Take 1 capsule (75 mg total) by mouth at bedtime. 90 capsule 0   oxyCODONE (ROXICODONE) 5 MG immediate release tablet Take 1 tablet (5 mg total) by mouth every 8 (eight) hours. 90 tablet 0   oxyCODONE (ROXICODONE) 5 MG immediate release tablet Take 1 tablet (5 mg total) by mouth every 8 (eight) hours. 90 tablet 0  oxyCODONE (ROXICODONE) 5 MG immediate release tablet Take 1 tablet (5 mg total) by mouth every 8 (eight) hours. 90 tablet 0   No current facility-administered medications on file prior to visit.    BP 110/78   Pulse 82   Temp 98 F (36.7 C) (Oral)   Ht 5\' 11"  (1.803 m)    Wt 227 lb (103 kg)   SpO2 99%   BMI 31.66 kg/m       Objective:   Physical Exam Vitals and nursing note reviewed.  Constitutional:      General: He is not in acute distress.    Appearance: Normal appearance. He is not ill-appearing.  HENT:     Head: Normocephalic and atraumatic.     Right Ear: Tympanic membrane, ear canal and external ear normal. There is no impacted cerumen.     Left Ear: Tympanic membrane, ear canal and external ear normal. There is no impacted cerumen.     Nose: Nose normal. No congestion or rhinorrhea.     Mouth/Throat:     Mouth: Mucous membranes are moist.     Pharynx: Oropharynx is clear.  Eyes:     Extraocular Movements: Extraocular movements intact.     Conjunctiva/sclera: Conjunctivae normal.     Pupils: Pupils are equal, round, and reactive to light.  Neck:     Vascular: No carotid bruit.  Cardiovascular:     Rate and Rhythm: Normal rate and regular rhythm.     Pulses: Normal pulses.     Heart sounds: No murmur heard.    No friction rub. No gallop.  Pulmonary:     Effort: Pulmonary effort is normal.     Breath sounds: Normal breath sounds.  Abdominal:     General: Abdomen is flat. Bowel sounds are normal. There is no distension.     Palpations: Abdomen is soft. There is no mass.     Tenderness: There is no abdominal tenderness. There is no guarding or rebound.     Hernia: No hernia is present.  Musculoskeletal:        General: Normal range of motion.     Cervical back: Normal range of motion and neck supple.  Lymphadenopathy:     Cervical: No cervical adenopathy.  Skin:    General: Skin is warm and dry.     Capillary Refill: Capillary refill takes less than 2 seconds.  Neurological:     General: No focal deficit present.     Mental Status: He is alert and oriented to person, place, and time.     Gait: Gait abnormal (limpin gait. Using cane).  Psychiatric:        Mood and Affect: Mood normal.        Behavior: Behavior normal.         Thought Content: Thought content normal.        Judgment: Judgment normal.       Assessment & Plan:   1. Routine general medical examination at a health care facility (Primary) Today patient counseled on age appropriate routine health concerns for screening and prevention, each reviewed and up to date or declined. Immunizations reviewed and up to date or declined. Labs ordered and reviewed. Risk factors for depression reviewed and negative. Hearing function and visual acuity are intact. ADLs screened and addressed as needed. Functional ability and level of safety reviewed and appropriate. Education, counseling and referrals performed based on assessed risks today. Patient provided with a copy of personalized plan for preventive services.  2. Chronic pain syndrome - Will refill medications but I am going to refer him to [facility] for better pain management  - DRUG MONITOR, PANEL 1, W/CONF, URINE; Future - predniSONE (DELTASONE) 10 MG tablet; Take 1 tablet (10 mg total) by mouth daily with breakfast.  Dispense: 5 tablet; Refill: 0  3. Polyarthritis  - DRUG MONITOR, PANEL 1, W/CONF, URINE; Future - predniSONE (DELTASONE) 10 MG tablet; Take 1 tablet (10 mg total) by mouth daily with breakfast.  Dispense: 5 tablet; Refill: 0  4. Complex regional pain syndrome type 1 of lower extremity, unspecified laterality  - DRUG MONITOR, PANEL 1, W/CONF, URINE; Future - predniSONE (DELTASONE) 10 MG tablet; Take 1 tablet (10 mg total) by mouth daily with breakfast.  Dispense: 5 tablet; Refill: 0  5. Gastroesophageal reflux disease, unspecified whether esophagitis present - Continue pepcid   6. Tobacco use - encouaged to quit smoking and vaping  - nicotine (NICODERM CQ) 14 mg/24hr patch; Place 1 patch (14 mg total) onto the skin daily.  Dispense: 28 patch; Refill: 0  7. Mixed hyperlipidemia - Consider statin  - Lipid panel; Future - TSH; Future - CBC; Future - Comprehensive metabolic  panel; Future  Shirline Frees, NP

## 2023-05-07 NOTE — Patient Instructions (Signed)
It was great seeing you today   We will follow up with you regarding your lab work   Please let me know if you need anything   I am going to refer you to [facility]. They will contact you to schedule.

## 2023-05-08 LAB — DM TEMPLATE

## 2023-05-08 LAB — DRUG MONITOR, PANEL 1, W/CONF, URINE
Amphetamines: NEGATIVE ng/mL (ref <500)
Barbiturates: NEGATIVE ng/mL (ref <300)
Benzodiazepines: NEGATIVE ng/mL (ref <100)
Cocaine Metabolite: NEGATIVE ng/mL (ref <150)
Creatinine: 45.8 mg/dL (ref >=20.0)
Marijuana Metabolite: NEGATIVE ng/mL (ref <20)
Methadone Metabolite: NEGATIVE ng/mL (ref <100)
Opiates: NEGATIVE ng/mL (ref <100)
Oxidant: NEGATIVE ug/mL (ref <200)
Oxycodone: NEGATIVE ng/mL (ref <100)
Phencyclidine: NEGATIVE ng/mL (ref <25)
pH: 6.6 (ref 4.5–9.0)

## 2023-06-02 ENCOUNTER — Telehealth: Admitting: Physician Assistant

## 2023-06-02 DIAGNOSIS — B9689 Other specified bacterial agents as the cause of diseases classified elsewhere: Secondary | ICD-10-CM

## 2023-06-02 DIAGNOSIS — J019 Acute sinusitis, unspecified: Secondary | ICD-10-CM | POA: Diagnosis not present

## 2023-06-02 MED ORDER — DOXYCYCLINE HYCLATE 100 MG PO TABS
100.0000 mg | ORAL_TABLET | Freq: Two times a day (BID) | ORAL | 0 refills | Status: DC
  Filled 2023-06-02: qty 20, 10d supply, fill #0

## 2023-06-02 NOTE — Progress Notes (Signed)

## 2023-06-03 ENCOUNTER — Other Ambulatory Visit (HOSPITAL_COMMUNITY): Payer: Self-pay

## 2023-06-05 ENCOUNTER — Other Ambulatory Visit (HOSPITAL_COMMUNITY): Payer: Self-pay

## 2023-07-06 ENCOUNTER — Other Ambulatory Visit (HOSPITAL_COMMUNITY): Payer: Self-pay

## 2023-10-14 ENCOUNTER — Other Ambulatory Visit: Payer: Self-pay

## 2023-10-29 ENCOUNTER — Ambulatory Visit (INDEPENDENT_AMBULATORY_CARE_PROVIDER_SITE_OTHER): Admitting: Orthopaedic Surgery

## 2023-10-29 ENCOUNTER — Other Ambulatory Visit (INDEPENDENT_AMBULATORY_CARE_PROVIDER_SITE_OTHER): Payer: Self-pay

## 2023-10-29 ENCOUNTER — Other Ambulatory Visit: Payer: Self-pay

## 2023-10-29 ENCOUNTER — Other Ambulatory Visit (HOSPITAL_COMMUNITY): Payer: Self-pay

## 2023-10-29 DIAGNOSIS — G8929 Other chronic pain: Secondary | ICD-10-CM

## 2023-10-29 DIAGNOSIS — M25511 Pain in right shoulder: Secondary | ICD-10-CM

## 2023-10-29 DIAGNOSIS — M25552 Pain in left hip: Secondary | ICD-10-CM

## 2023-10-29 MED ORDER — METHYLPREDNISOLONE 4 MG PO TBPK
ORAL_TABLET | ORAL | 0 refills | Status: DC
  Filled 2023-10-29 (×2): qty 21, 6d supply, fill #0

## 2023-10-29 NOTE — Progress Notes (Signed)
 Office Visit Note   Patient: Shane D Chipman Jr.           Date of Birth: [birth year]           MRN: [id number] Visit Date: 10/29/2023              Requested by: Merna Huxley, NP [address] [place],  [place] [address] PCP: Merna Huxley, NP   Assessment & Plan: Visit Diagnoses:  1. Pain in left hip   2. Chronic right shoulder pain     Plan: History of Present Illness Shane D Mahn Jr. is a 43 year old male with avascular necrosis of the left hip who presents with left hip and right shoulder pain.  He experiences left hip pain every two weeks, originating in the buttock with tingling radiating down the leg and foot swelling. The pain worsens with driving or sitting in certain positions. Occasional groin pain occurs, particularly when pivoting or rotating the hip, distinct from the buttock pain.  Chronic right shoulder pain has worsened over the years, with weakness and difficulty raising the arm. Pain occurs when holding objects as light as eight pounds, described as a 'stretched' sensation. The shoulder pain is accompanied by a popping sensation, particularly involving the biceps tendon.  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  Physical Exam MUSCULOSKELETAL: Tenderness over left hip on palpation. Pain in groin on hip rotation. No tenderness over AC joint on palpation. Pain on shoulder impingement test. Rotator cuff function and flexibility normal.  Assessment and Plan Avascular necrosis of left hip Progression of AVN in left hip causing buttock and groin pain, leg tingling, and foot swelling. X-rays show small AVN area.  No collapse of femoral head. - Prescribe oral steroids for pain and inflammation. - Consider cortisone injection if oral steroids ineffective.  Right shoulder biceps tendinopathy Chronic right shoulder pain with weakness and difficulty raising arm, likely due to biceps tendon  inflammation. X-rays normal. - Prescribe oral steroids for shoulder pain. - Consider referral to Dr. Burnetta for steroid injection if oral steroids ineffective.  Follow-Up Instructions: No follow-ups on file.   Orders:  Orders Placed This Encounter  Procedures   XR Shoulder Right   XR HIP UNILAT W OR W/O PELVIS 2-3 VIEWS LEFT   Meds ordered this encounter  Medications   methylPREDNISolone  (MEDROL  DOSEPAK) 4 MG TBPK tablet    Sig: Take as directed    Dispense:  21 tablet    Refill:  0      Procedures: No procedures performed   Clinical Data: No additional findings.   Subjective: Chief Complaint  Patient presents with   Right Shoulder - Pain   Left Hip - Pain    HPI  Review of Systems  Constitutional: Negative.   HENT: Negative.    Eyes: Negative.   Respiratory: Negative.    Cardiovascular: Negative.   Gastrointestinal: Negative.   Endocrine: Negative.   Genitourinary: Negative.   Skin: Negative.   Allergic/Immunologic: Negative.   Neurological: Negative.   Hematological: Negative.   Psychiatric/Behavioral: Negative.    All other systems reviewed and are negative.    Objective: Vital Signs: There were no vitals taken for this visit.  Physical Exam Vitals and nursing note reviewed.  Constitutional:      Appearance: He is well-developed.  HENT:     Head: Normocephalic and atraumatic.  Eyes:     Pupils: Pupils are  equal, round, and reactive to light.  Pulmonary:     Effort: Pulmonary effort is normal.  Abdominal:     Palpations: Abdomen is soft.  Musculoskeletal:        General: Normal range of motion.     Cervical back: Neck supple.  Skin:    General: Skin is warm.  Neurological:     Mental Status: He is alert and oriented to person, place, and time.  Psychiatric:        Behavior: Behavior normal.        Thought Content: Thought content normal.        Judgment: Judgment normal.     Ortho Exam  Specialty Comments:  No specialty  comments available.  Imaging: XR Shoulder Right Result Date: 10/29/2023 3 view xrays show no acute or structural abnormalities  XR HIP UNILAT W OR W/O PELVIS 2-3 VIEWS LEFT Result Date: 10/29/2023 X-rays of the left hip show no acute abnormalities.  Small lucency of the femoral head consistent with avascular necrosis.  There is no collapse of the femoral head.    PMFS History: Patient Active Problem List   Diagnosis Date Noted   Pain of left hip 08/09/2021   Raynaud phenomenon 05/08/2021   Family history of avascular necrosis of hip (Right) 05/08/2021   History of total hip replacement (Right) 12/03/2020   Pharmacologic therapy 07/30/2020   Disorder of skeletal system 07/30/2020   Problems influencing health status 07/30/2020   Avascular necrosis of hip (Right) (HCC) 07/30/2020   Chronic hip pain (Right) 07/30/2020   Chronic low back pain (2ry area of Pain) (Bilateral) w/o sciatica 07/30/2020   Chronic shoulder pain (3ry area of Pain) (Right) 07/30/2020   Auricular cyst 01/21/2018   Laryngopharyngeal reflux (LPR) 01/21/2018   GERD (gastroesophageal reflux disease) 07/16/2016   Internal hemorrhoid 07/16/2016   Abdominal pain, chronic, epigastric 07/16/2016   Rectal bleeding 07/16/2016   Essential hypertension 02/26/2016   Adhesive capsulitis of right shoulder 07/16/2015   Thoracic outlet syndrome 02/12/2015   Chronic narcotic use 10/26/2014   Opioid contract exists 10/26/2014   Smoker 05/11/2014   Coccygeal pain, acute 05/18/2012   Chronic pain syndrome 05/18/2012   Chronic diarrhea 03/29/2012   Bipolar disorder (HCC) 03/29/2012   Chronic foot pain (1ry area of Pain) (Left) 06/11/2011   Past Medical History:  Diagnosis Date   Allergy    Anxiety    Bipolar disorder (HCC)    Bronchitis    Bronchitis    Crush injury lower leg    Left lower leg   Depression    Gastric ulcer    GERD (gastroesophageal reflux disease)    will awaken him in night   History of hiatal  hernia    Hypertension    Migraine    Nerve pain    RSD (reflex sympathetic dystrophy)     Family History  Problem Relation Age of Onset   Hyperlipidemia Father    Hypertension Father    Anxiety disorder Father    Drug abuse Father    Cancer Paternal Grandfather        lung, colon   Colon cancer Paternal Grandfather    Lung cancer Paternal Grandfather    Diabetes Paternal Grandmother    Cancer Paternal Grandmother    Cancer Maternal Grandmother    Cancer Maternal Grandfather    Lung cancer Maternal Grandfather    Stroke Other    Heart disease Other    Depression Paternal Aunt    Anxiety  disorder Paternal Aunt    Drug abuse Paternal Uncle    Colon cancer Paternal Uncle    Esophageal cancer Neg Hx    Stomach cancer Neg Hx    Rectal cancer Neg Hx     Past Surgical History:  Procedure Laterality Date   CHOLECYSTECTOMY N/A 01/24/2014   Procedure: LAPAROSCOPIC CHOLECYSTECTOMY;  Surgeon: Lynda Leos, MD;  Location: [facility] OR;  Service: General;  Laterality: N/A;   COLONOSCOPY     HAND SURGERY     MASS EXCISION Left 01/25/2013   Procedure: EXCISION MASS DORSAL ASPECT LEFT LONG FINGER DISTAL INTERPHALANGEAL JOINT;  Surgeon: Lamar LULLA Leonor Mickey., MD;  Location: [facility];  Service: Orthopedics;  Laterality: Left;  Left long    MOUTH SURGERY     TOTAL HIP ARTHROPLASTY Right 12/03/2020   Procedure: TOTAL HIP ARTHROPLASTY ANTERIOR APPROACH;  Surgeon: Jerri Kay HERO, MD;  Location: [facility];  Service: Orthopedics;  Laterality: Right;  3-C   Social History   Occupational History   Occupation: Disabled    Comment: Previously worked Web designer  Tobacco Use   Smoking status: Every Day    Current packs/day: 0.25    Average packs/day: 0.3 packs/day for 23.0 years (5.8 ttl pk-yrs)    Types: Cigarettes   Smokeless tobacco: Never   Tobacco comments:    Using nicotine  patch  Vaping Use   Vaping status: Never Used  Substance and Sexual Activity    Alcohol  use: Yes    Alcohol /week: 6.0 standard drinks of alcohol     Types: 6 Cans of beer per week    Comment: occasional   Drug use: No   Sexual activity: Yes    Partners: Female

## 2023-11-19 ENCOUNTER — Telehealth: Admitting: Physician Assistant

## 2023-11-19 DIAGNOSIS — J019 Acute sinusitis, unspecified: Secondary | ICD-10-CM | POA: Diagnosis not present

## 2023-11-19 DIAGNOSIS — B9689 Other specified bacterial agents as the cause of diseases classified elsewhere: Secondary | ICD-10-CM

## 2023-11-19 MED ORDER — DOXYCYCLINE HYCLATE 100 MG PO TABS: 100.0000 mg | ORAL_TABLET | Freq: Two times a day (BID) | ORAL | 0 refills | Status: DC

## 2023-11-19 NOTE — Progress Notes (Signed)
 I have spent 5 minutes in review of e-visit questionnaire, review and updating patient chart, medical decision making and response to patient.   Elsie Velma Lunger, PA-C

## 2023-11-19 NOTE — Progress Notes (Signed)

## 2024-01-12 ENCOUNTER — Ambulatory Visit: Admitting: Adult Health

## 2024-01-13 ENCOUNTER — Other Ambulatory Visit: Payer: Self-pay | Admitting: Adult Health

## 2024-01-14 ENCOUNTER — Ambulatory Visit: Admitting: Adult Health

## 2024-01-19 ENCOUNTER — Telehealth: Payer: Self-pay

## 2024-01-19 ENCOUNTER — Encounter: Payer: Self-pay | Admitting: Orthopaedic Surgery

## 2024-01-19 ENCOUNTER — Telehealth: Payer: Self-pay | Admitting: Adult Health

## 2024-01-19 ENCOUNTER — Other Ambulatory Visit: Payer: Self-pay

## 2024-01-19 ENCOUNTER — Other Ambulatory Visit (HOSPITAL_COMMUNITY): Payer: Self-pay

## 2024-01-19 ENCOUNTER — Ambulatory Visit: Admitting: Orthopaedic Surgery

## 2024-01-19 DIAGNOSIS — M1612 Unilateral primary osteoarthritis, left hip: Secondary | ICD-10-CM | POA: Diagnosis not present

## 2024-01-19 MED ORDER — METHYLPREDNISOLONE 4 MG PO TBPK
ORAL_TABLET | ORAL | 0 refills | Status: DC
  Filled 2024-01-19: qty 21, 6d supply, fill #0

## 2024-01-19 NOTE — Telephone Encounter (Signed)
 Noted

## 2024-01-19 NOTE — Progress Notes (Addendum)
 Office Visit Note   Patient: Shane D Carvey Jr.           Date of Birth: 24-Apr-1980           MRN: [id number] Visit Date: 01/19/2024              Requested by: Merna Huxley, NP [address] [place],  [place] [address] PCP: Merna Huxley, NP   Assessment & Plan: Visit Diagnoses:  1. Primary osteoarthritis of left hip     Plan: History of Present Illness Shane D Seigler Jr. is a 43 year old male who presents with worsening left hip pain.  He experiences persistent pain in the left hip, originating in the groin area, which began a few weeks ago. The pain disrupts sleep and daily activities. An MRI in 2023 shows advanced arthritis in the left hip. He has no history of surgeries in the hip area and no changes in his medical history since the last evaluation.  Exam of the left hip shows pain with hip flexion and rotation.  Antalgic gait.  Uses a cane for ambulation.  Results RADIOLOGY Hip MRI: Advanced osteoarthritis (2023)  Assessment and Plan Left hip osteoarthritis Progressive osteoarthritis with persistent groin pain. MRI showed advanced arthritis. He opted for hip replacement surgery before year-end. - Provide clearance sheet for primary care physician. - Schedule left hip replacement surgery before New Year. - Order new X-ray of left hip.  Impression is severe left hip degenerative joint disease secondary to Osteoarthritis.  Patient has attempted conservative treatment for at least 6 consecutive weeks within the past 12 weeks, including but not limited to physical therapy, home exercise program, NSAIDs, activity modification, and/or corticosteroid injections. Despite these efforts, symptoms have not improved or have worsened. Conservative measures have been deemed unsuccessful at this time. After a detailed discussion covering diagnosis and treatment options--including the risks, benefits, alternatives, and potential complications of surgical and nonsurgical  management--the patient elected to proceed with surgery.  Current anticoagulants: No antithrombotic Postop anticoagulation: Aspirin 81 mg Diabetic: No  Prior DVT/PE: No Tobacco use: No Clearances needed for surgery: PCP Anticipate discharge dispo: home   Follow-Up Instructions: No follow-ups on file.   Orders:  Orders Placed This Encounter  Procedures   XR HIP UNILAT W OR W/O PELVIS 2-3 VIEWS LEFT   Meds ordered this encounter  Medications   methylPREDNISolone  (MEDROL  DOSEPAK) 4 MG TBPK tablet    Sig: Take as directed    Dispense:  21 tablet    Refill:  0    Subjective: Chief Complaint  Patient presents with   Left Hip - Follow-up    HPI  Review of Systems  Constitutional: Negative.   HENT: Negative.    Eyes: Negative.   Respiratory: Negative.    Cardiovascular: Negative.   Gastrointestinal: Negative.   Endocrine: Negative.   Genitourinary: Negative.   Skin: Negative.   Allergic/Immunologic: Negative.   Neurological: Negative.   Hematological: Negative.   Psychiatric/Behavioral: Negative.    All other systems reviewed and are negative.    Objective: Vital Signs: There were no vitals taken for this visit.  Physical Exam Vitals and nursing note reviewed.  Constitutional:      Appearance: He is well-developed.  HENT:     Head: Normocephalic and atraumatic.  Eyes:     Pupils: Pupils are equal, round, and reactive to light.  Pulmonary:     Effort: Pulmonary effort is normal.  Abdominal:     Palpations: Abdomen is  soft.  Musculoskeletal:        General: Normal range of motion.     Cervical back: Neck supple.  Skin:    General: Skin is warm.  Neurological:     Mental Status: He is alert and oriented to person, place, and time.  Psychiatric:        Behavior: Behavior normal.        Thought Content: Thought content normal.        Judgment: Judgment normal.     Ortho Exam  Specialty Comments:  No specialty comments available.  Imaging: XR  HIP UNILAT W OR W/O PELVIS 2-3 VIEWS LEFT Result Date: 01/19/2024 Xrays show mild joint space narrowing.      PMFS History: Patient Active Problem List   Diagnosis Date Noted   Primary osteoarthritis of left hip 01/19/2024   Pain of left hip 08/09/2021   Raynaud phenomenon 05/08/2021   Family history of avascular necrosis of hip (Right) 05/08/2021   History of total hip replacement (Right) 12/03/2020   Pharmacologic therapy 07/30/2020   Disorder of skeletal system 07/30/2020   Problems influencing health status 07/30/2020   Avascular necrosis of hip (Right) (HCC) 07/30/2020   Chronic hip pain (Right) 07/30/2020   Chronic low back pain (2ry area of Pain) (Bilateral) w/o sciatica 07/30/2020   Chronic shoulder pain (3ry area of Pain) (Right) 07/30/2020   Auricular cyst 01/21/2018   Laryngopharyngeal reflux (LPR) 01/21/2018   GERD (gastroesophageal reflux disease) 07/16/2016   Internal hemorrhoid 07/16/2016   Abdominal pain, chronic, epigastric 07/16/2016   Rectal bleeding 07/16/2016   Essential hypertension 02/26/2016   Adhesive capsulitis of right shoulder 07/16/2015   Thoracic outlet syndrome 02/12/2015   Chronic narcotic use 10/26/2014   Opioid contract exists 10/26/2014   Smoker 05/11/2014   Coccygeal pain, acute 05/18/2012   Chronic pain syndrome 05/18/2012   Chronic diarrhea 03/29/2012   Bipolar disorder (HCC) 03/29/2012   Chronic foot pain (1ry area of Pain) (Left) 06/11/2011   Past Medical History:  Diagnosis Date   Allergy    Anxiety    Bipolar disorder (HCC)    Bronchitis    Bronchitis    Crush injury lower leg    Left lower leg   Depression    Gastric ulcer    GERD (gastroesophageal reflux disease)    will awaken him in night   History of hiatal hernia    Hypertension    Migraine    Nerve pain    RSD (reflex sympathetic dystrophy)     Family History  Problem Relation Age of Onset   Hyperlipidemia Father    Hypertension Father    Anxiety disorder  Father    Drug abuse Father    Cancer Paternal Grandfather        lung, colon   Colon cancer Paternal Grandfather    Lung cancer Paternal Grandfather    Diabetes Paternal Grandmother    Cancer Paternal Grandmother    Cancer Maternal Grandmother    Cancer Maternal Grandfather    Lung cancer Maternal Grandfather    Stroke Other    Heart disease Other    Depression Paternal Aunt    Anxiety disorder Paternal Aunt    Drug abuse Paternal Uncle    Colon cancer Paternal Uncle    Esophageal cancer Neg Hx    Stomach cancer Neg Hx    Rectal cancer Neg Hx     Past Surgical History:  Procedure Laterality Date   CHOLECYSTECTOMY N/A 01/24/2014  Procedure: LAPAROSCOPIC CHOLECYSTECTOMY;  Surgeon: Lynda Leos, MD;  Location: [facility];  Service: General;  Laterality: N/A;   COLONOSCOPY     HAND SURGERY     MASS EXCISION Left 01/25/2013   Procedure: EXCISION MASS DORSAL ASPECT LEFT LONG FINGER DISTAL INTERPHALANGEAL JOINT;  Surgeon: Lamar LULLA Leonor Mickey., MD;  Location: [facility];  Service: Orthopedics;  Laterality: Left;  Left long    MOUTH SURGERY     TOTAL HIP ARTHROPLASTY Right 12/03/2020   Procedure: TOTAL HIP ARTHROPLASTY ANTERIOR APPROACH;  Surgeon: Jerri Kay HERO, MD;  Location: [facility];  Service: Orthopedics;  Laterality: Right;  3-C   Social History   Occupational History   Occupation: Disabled    Comment: Previously worked web designer  Tobacco Use   Smoking status: Every Day    Current packs/day: 0.25    Average packs/day: 0.3 packs/day for 23.0 years (5.8 ttl pk-yrs)    Types: Cigarettes   Smokeless tobacco: Never   Tobacco comments:    Using nicotine  patch  Vaping Use   Vaping status: Never Used  Substance and Sexual Activity   Alcohol  use: Yes    Alcohol /week: 6.0 standard drinks of alcohol     Types: 6 Cans of beer per week    Comment: occasional   Drug use: No   Sexual activity: Yes    Partners: Female

## 2024-01-19 NOTE — Telephone Encounter (Signed)
 [facility] Pre-Op form to be filled out--placed in provider's folder.  Please fax form to [phone] upon completion.

## 2024-01-19 NOTE — Addendum Note (Signed)
 Addended by: JERRI LOISE SHARPER on: 01/19/2024 01:29 PM   Modules accepted: Orders

## 2024-01-19 NOTE — Telephone Encounter (Signed)
 Patient given PCP surgical clearance form. Aware that we must receive clearance form back before being able to proceed with scheduling surgery.

## 2024-01-25 ENCOUNTER — Encounter: Payer: Self-pay | Admitting: Radiology

## 2024-01-26 ENCOUNTER — Ambulatory Visit: Admitting: Adult Health

## 2024-01-26 VITALS — BP 120/80 | HR 98 | Temp 97.8°F | Ht 71.0 in | Wt 236.0 lb

## 2024-01-26 DIAGNOSIS — R7989 Other specified abnormal findings of blood chemistry: Secondary | ICD-10-CM

## 2024-01-26 DIAGNOSIS — R1310 Dysphagia, unspecified: Secondary | ICD-10-CM | POA: Diagnosis not present

## 2024-01-26 DIAGNOSIS — G90529 Complex regional pain syndrome I of unspecified lower limb: Secondary | ICD-10-CM

## 2024-01-26 DIAGNOSIS — Z01818 Encounter for other preprocedural examination: Secondary | ICD-10-CM

## 2024-01-26 DIAGNOSIS — G894 Chronic pain syndrome: Secondary | ICD-10-CM | POA: Diagnosis not present

## 2024-01-26 DIAGNOSIS — R52 Pain, unspecified: Secondary | ICD-10-CM

## 2024-01-26 LAB — CBC
HCT: 45.8 % (ref 39.0–52.0)
Hemoglobin: 15.4 g/dL (ref 13.0–17.0)
MCHC: 33.6 g/dL (ref 30.0–36.0)
MCV: 91.5 fl (ref 78.0–100.0)
Platelets: 261 K/uL (ref 150.0–400.0)
RBC: 5.01 Mil/uL (ref 4.22–5.81)
RDW: 13.4 % (ref 11.5–15.5)
WBC: 8.9 K/uL (ref 4.0–10.5)

## 2024-01-26 LAB — COMPREHENSIVE METABOLIC PANEL WITH GFR
ALT: 24 U/L (ref 0–53)
AST: 16 U/L (ref 0–37)
Albumin: 4.9 g/dL (ref 3.5–5.2)
Alkaline Phosphatase: 84 U/L (ref 39–117)
BUN: 17 mg/dL (ref 6–23)
CO2: 28 meq/L (ref 19–32)
Calcium: 9.3 mg/dL (ref 8.4–10.5)
Chloride: 98 meq/L (ref 96–112)
Creatinine, Ser: 1.05 mg/dL (ref 0.40–1.50)
GFR: 86.39 mL/min (ref >60.00)
Glucose, Bld: 98 mg/dL (ref 70–99)
Potassium: 4.1 meq/L (ref 3.5–5.1)
Sodium: 139 meq/L (ref 135–145)
Total Bilirubin: 0.5 mg/dL (ref 0.2–1.2)
Total Protein: 7.5 g/dL (ref 6.0–8.3)

## 2024-01-26 LAB — URINALYSIS
Bilirubin Urine: NEGATIVE
Hgb urine dipstick: NEGATIVE
Ketones, ur: NEGATIVE
Leukocytes,Ua: NEGATIVE
Nitrite: NEGATIVE
Specific Gravity, Urine: 1.01 (ref 1.000–1.030)
Total Protein, Urine: NEGATIVE
Urine Glucose: NEGATIVE
Urobilinogen, UA: 0.2 (ref 0.0–1.0)
pH: 6 (ref 5.0–8.0)

## 2024-01-26 NOTE — Progress Notes (Signed)
 Subjective:    Patient ID: Deward JONETTA Tenna Mickey., male    DOB: 03-01-1981, 43 y.o.   MRN: [id number]  HPI 43 year old male who  has a past medical history of Allergy, Anxiety, Bipolar disorder (HCC), Bronchitis, Bronchitis, Crush injury lower leg, Depression, Gastric ulcer, GERD (gastroesophageal reflux disease), History of hiatal hernia, Hypertension, Migraine, Nerve pain, and RSD (reflex sympathetic dystrophy).  He presents for preoperative clearance of left total hip arthroplasty. Dr. Jerri will be doing to the surgery. He experiences constant pain in his left hip that disrupts his sleep and ADL's. An MRI from 2023 shows advanced arthritis in his left hip. He reports that he has come off all of his pain meds and is now taking Kratom. He feels as though this helped with his pain for awhile but that the pain has started to come back. He would like to eventually go back on his pain medications.  Additionally he reports that he has started to feel like food is getting stuck in his throat ( especially soft foods). He has had to vomiting to get the food to come back up. He last had an endoscopy in 2020 which showed a small hiatal hernia and mild gastritis.   Furthermore, back in Feb 2025 his blood work showed a TSH of 7.24  and he never came back for recheck due to financial issues.    Review of Systems See HPI   Past Medical History:  Diagnosis Date   Allergy    Anxiety    Bipolar disorder (HCC)    Bronchitis    Bronchitis    Crush injury lower leg    Left lower leg   Depression    Gastric ulcer    GERD (gastroesophageal reflux disease)    will awaken him in night   History of hiatal hernia    Hypertension    Migraine    Nerve pain    RSD (reflex sympathetic dystrophy)     Social History   Socioeconomic History   Marital status: Married    Spouse name: Not on file   Number of children: 1   Years of education: 10th grade   Highest education level: GED or equivalent   Occupational History   Occupation: Disabled    Comment: Previously worked web designer  Tobacco Use   Smoking status: Every Day    Current packs/day: 0.25    Average packs/day: 0.3 packs/day for 23.0 years (5.8 ttl pk-yrs)    Types: Cigarettes   Smokeless tobacco: Never   Tobacco comments:    Using nicotine  patch  Vaping Use   Vaping status: Never Used  Substance and Sexual Activity   Alcohol  use: Yes    Alcohol /week: 6.0 standard drinks of alcohol     Types: 6 Cans of beer per week    Comment: occasional   Drug use: No   Sexual activity: Yes    Partners: Female  Other Topics Concern   Not on file  Social History Narrative   05/24/12  Latavius was born and grew up in [place], [place] .    He has 2 sisters.    He reports that his childhood was lousy.    He completed the 10th grade.    He has been married for 5 years, and is currently separated for 3 weeks.    He has a daughter who is 42-1/2 years old.    He has been unemployed for 2 years, and is disabled  due to an on-the-job work accident.    He is currently living with his aunt and cousin.    He reports that his hobbies are sports and dogs.    He reports that he is spiritual, but not religious.    He states that his aunt and his father are his social support system.    He denies any current legal problems, but got a DUI in 2007. 05/24/12 [facility]      Social Drivers of Health   Financial Resource Strain: Medium Risk (01/26/2024)   Overall Financial Resource Strain (CARDIA)    Difficulty of Paying Living Expenses: Somewhat hard  Food Insecurity: Food Insecurity Present (01/26/2024)   Hunger Vital Sign    Worried About Running Out of Food in the Last Year: Sometimes true    Ran Out of Food in the Last Year: Never true  Transportation Needs: No Transportation Needs (01/26/2024)   PRAPARE - Administrator, Civil Service (Medical): No    Lack of Transportation (Non-Medical): No  Physical Activity:  Insufficiently Active (01/26/2024)   Exercise Vital Sign    Days of Exercise per Week: 2 days    Minutes of Exercise per Session: 10 min  Stress: No Stress Concern Present (01/26/2024)   [employer] of Occupational Health - Occupational Stress Questionnaire    Feeling of Stress: Only a little  Social Connections: Socially Integrated (01/26/2024)   Social Connection and Isolation Panel    Frequency of Communication with Friends and Family: Twice a week    Frequency of Social Gatherings with Friends and Family: Twice a week    Attends Religious Services: More than 4 times per year    Active Member of Clubs or Organizations: Yes    Attends Banker Meetings: 1 to 4 times per year    Marital Status: Married  Catering Manager Violence: Not on file    Past Surgical History:  Procedure Laterality Date   CHOLECYSTECTOMY N/A 01/24/2014   Procedure: LAPAROSCOPIC CHOLECYSTECTOMY;  Surgeon: Lynda Leos, MD;  Location: [facility];  Service: General;  Laterality: N/A;   COLONOSCOPY     HAND SURGERY     MASS EXCISION Left 01/25/2013   Procedure: EXCISION MASS DORSAL ASPECT LEFT LONG FINGER DISTAL INTERPHALANGEAL JOINT;  Surgeon: Lamar LULLA Leonor Mickey., MD;  Location: [facility];  Service: Orthopedics;  Laterality: Left;  Left long    MOUTH SURGERY     TOTAL HIP ARTHROPLASTY Right 12/03/2020   Procedure: TOTAL HIP ARTHROPLASTY ANTERIOR APPROACH;  Surgeon: Jerri Kay HERO, MD;  Location: [facility];  Service: Orthopedics;  Laterality: Right;  3-C    Family History  Problem Relation Age of Onset   Hyperlipidemia Father    Hypertension Father    Anxiety disorder Father    Drug abuse Father    Cancer Paternal Grandfather        lung, colon   Colon cancer Paternal Grandfather    Lung cancer Paternal Grandfather    Diabetes Paternal Grandmother    Cancer Paternal Grandmother    Cancer Maternal Grandmother    Cancer Maternal Grandfather    Lung cancer Maternal Grandfather     Stroke Other    Heart disease Other    Depression Paternal Aunt    Anxiety disorder Paternal Aunt    Drug abuse Paternal Uncle    Colon cancer Paternal Uncle    Esophageal cancer Neg Hx    Stomach cancer Neg Hx    Rectal  cancer Neg Hx     Allergies  Allergen Reactions   Aspirin Shortness Of Breath and Tinitus    Tinnitus, dizzy, short of breath   Codeine Itching    Reaction to tylenol  #3   Penicillins Anaphylaxis and Other (See Comments)    Was told never to take medication.  Has patient had a PCN reaction causing immediate rash, facial/tongue/throat swelling, SOB or lightheadedness with hypotension: no Has patient had a PCN reaction causing severe rash involving mucus membranes or skin necrosis: no Has patient had a PCN reaction that required hospitalization: no Has patient had a PCN reaction occurring within the last 10 years: no If all of the above answers are NO, then may proceed with Cephalosporin use.    Tramadol      seizure    Amitriptyline  Rash    Current Outpatient Medications on File Prior to Visit  Medication Sig Dispense Refill   albuterol  (VENTOLIN  HFA) 108 (90 Base) MCG/ACT inhaler Inhale 2 puffs into the lungs every 6 (six) hours as needed for wheezing or shortness of breath. 6.7 g 0   famotidine  (PEPCID ) 20 MG tablet Take 20 mg by mouth daily as needed for heartburn.     gabapentin  (NEURONTIN ) 300 MG capsule Take 1 capsule (300 mg total) by mouth 3 (three) times daily. 270 capsule 0   ibuprofen  (ADVIL ) 800 MG tablet Take 1 tablet (800 mg total) by mouth 3 (three) times daily as needed. 90 tablet 6   lidocaine  (LIDODERM ) 5 % Place 2 patches onto the skin daily. Remove & Discard patch within 12 hours or as directed by MD 60 patch 3   lidocaine  (XYLOCAINE ) 5 % ointment Apply topically as needed for moderate pain. 50 g 2   Multiple Vitamin (MULTIVITAMIN WITH MINERALS) TABS tablet Take 1 tablet by mouth daily.     Nerve Stimulator (TENS THERAPY PAIN RELIEF) DEVI 1  application by Does not apply route daily as needed (chronic pain).     nortriptyline  (PAMELOR ) 75 MG capsule Take 1 capsule (75 mg total) by mouth at bedtime. 90 capsule 0   doxycycline  (VIBRA -TABS) 100 MG tablet Take 1 tablet (100 mg total) by mouth 2 (two) times daily. 20 tablet 0   methylPREDNISolone  (MEDROL  DOSEPAK) 4 MG TBPK tablet Take as directed 21 tablet 0   nicotine  (NICODERM CQ ) 14 mg/24hr patch Place 1 patch (14 mg total) onto the skin daily. 28 patch 0   oxyCODONE  (ROXICODONE ) 5 MG immediate release tablet Take 1 tablet (5 mg total) by mouth every 8 (eight) hours. 90 tablet 0   oxyCODONE  (ROXICODONE ) 5 MG immediate release tablet Take 1 tablet (5 mg total) by mouth every 8 (eight) hours. 90 tablet 0   oxyCODONE  (ROXICODONE ) 5 MG immediate release tablet Take 1 tablet (5 mg total) by mouth every 8 (eight) hours. 90 tablet 0   predniSONE  (DELTASONE ) 10 MG tablet Take 1 tablet (10 mg total) by mouth daily with breakfast. 5 tablet 0   No current facility-administered medications on file prior to visit.    BP 120/80   Pulse 98   Temp 97.8 F (36.6 C) (Oral)   Ht 5' 11 (1.803 m)   Wt 236 lb (107 kg)   SpO2 99%   BMI 32.92 kg/m        Objective:   Physical Exam Vitals and nursing note reviewed.  Constitutional:      Appearance: Normal appearance.  Cardiovascular:     Rate and Rhythm: Normal rate and regular  rhythm.     Pulses: Normal pulses.     Heart sounds: Normal heart sounds.  Pulmonary:     Effort: Pulmonary effort is normal.     Breath sounds: Normal breath sounds.  Musculoskeletal:        General: Normal range of motion.  Skin:    General: Skin is warm and dry.  Neurological:     General: No focal deficit present.     Mental Status: He is alert and oriented to person, place, and time.     Gait: Gait abnormal (walks with cane).  Psychiatric:        Mood and Affect: Mood normal.        Behavior: Behavior normal.        Thought Content: Thought content  normal.        Judgment: Judgment normal.        Assessment & Plan:  1. Preoperative clearance (Primary)  - EKG 12-Lead- SR with RBBB, rate 87. Consistent with previous EKGs - Comprehensive metabolic panel with GFR; Future - CBC; Future - Urinalysis; Future - Urinalysis - CBC - Comprehensive metabolic panel with GFR  2. Abnormal TSH  - TSH; Future - T3, Free; Future - T4, Free; Future - T4, Free - TSH - T3, Free  3. Dysphagia, unspecified type  - Ambulatory referral to Gastroenterology  4. Chronic pain syndrome - Advised to come off Kratom  - Will refer to pain management at [facility]. Since it has been 7 months since he last was seen for pain management in this office   5. Pain management   6. Complex regional pain syndrome type 1 of lower extremity, unspecified laterality   Darleene Shape, NP

## 2024-01-27 LAB — TSH: TSH: 2.26 u[IU]/mL (ref 0.35–5.50)

## 2024-01-27 LAB — T4, FREE: Free T4: 0.82 ng/dL (ref 0.60–1.60)

## 2024-01-27 LAB — T3, FREE: T3, Free: 3 pg/mL (ref 2.3–4.2)

## 2024-01-28 ENCOUNTER — Ambulatory Visit: Payer: Self-pay | Admitting: Adult Health

## 2024-01-29 NOTE — Telephone Encounter (Signed)
 FYI

## 2024-02-01 ENCOUNTER — Telehealth: Payer: Self-pay | Admitting: Orthopaedic Surgery

## 2024-02-01 NOTE — Telephone Encounter (Signed)
 No pain meds will be given prior to surgery.  Marval can help answer the scheduling part of the question

## 2024-02-01 NOTE — Telephone Encounter (Signed)
 Pt called stating he is waiting to be called to schedule  surgery and asking for pain medication, Pt pharmacy is [facility]. Pt phone number is [phone] or [phone]

## 2024-02-23 NOTE — Pre-Procedure Instructions (Signed)
 Surgical Instructions   Your procedure is scheduled on Monday, December 8th. Report to [facility] Main Entrance A at 07:45 A.M., then check in with the Admitting office. Any questions or running late day of surgery: call [phone]  Questions prior to your surgery date: call [phone], Monday-Friday, 8am-4pm. If you experience any cold or flu symptoms such as cough, fever, chills, shortness of breath, etc. between now and your scheduled surgery, please notify us  at the above number.     Remember:  Do not eat after midnight the night before your surgery  You may drink clear liquids until 07:15 AM the morning of your surgery.   Clear liquids allowed are: Water , Non-Citrus Juices (without pulp), Carbonated Beverages, Clear Tea (no milk, honey, etc.), Black Coffee Only (NO MILK, CREAM OR POWDERED CREAMER of any kind), and Gatorade.  Patient Instructions  The night before surgery:  No food after midnight. ONLY clear liquids after midnight  The day of surgery (if you do NOT have diabetes):  Drink ONE (1) Pre-Surgery Clear Ensure by 07:15 AM the morning of surgery. Drink in one sitting. Do not sip.  This drink was given to you during your hospital  pre-op appointment visit.  Nothing else to drink after completing the  Pre-Surgery Clear Ensure.         If you have questions, please contact your surgeon's office.    Take these medicines the morning of surgery with A SIP OF WATER   famotidine  (PEPCID )  gabapentin  (NEURONTIN )    One week prior to surgery, STOP taking any Aspirin (unless otherwise instructed by your surgeon) Aleve , Naproxen , Ibuprofen , Motrin , Advil , Goody's, BC's, all herbal medications, fish oil, and non-prescription vitamins.                     Do NOT Smoke (Tobacco/Vaping) for 24 hours prior to your procedure.  If you use a CPAP at night, you may bring your mask/headgear for your overnight stay.   You will be asked to remove any contacts, glasses,  piercing's, hearing aid's, dentures/partials prior to surgery. Please bring cases for these items if needed.    Patients discharged the day of surgery will not be allowed to drive home, and someone needs to stay with them for 24 hours.  SURGICAL WAITING ROOM VISITATION Patients may have no more than 2 support people in the waiting area - these visitors may rotate.   Pre-op nurse will coordinate an appropriate time for 1 ADULT support person, who may not rotate, to accompany patient in pre-op.  Children under the age of 73 must have an adult with them who is not the patient and must remain in the main waiting area with an adult.  If the patient needs to stay at the hospital during part of their recovery, the visitor guidelines for inpatient rooms apply.  Please refer to the [facility] website for the visitor guidelines for any additional information.   If you received a COVID test during your pre-op visit  it is requested that you wear a mask when out in public, stay away from anyone that may not be feeling well and notify your surgeon if you develop symptoms. If you have been in contact with anyone that has tested positive in the last 10 days please notify you surgeon.      Pre-operative 4 CHG Bathing Instructions   You can play a key role in reducing the risk of infection after surgery. Your skin needs to be as  free of germs as possible. You can reduce the number of germs on your skin by washing with CHG (chlorhexidine  gluconate) soap before surgery. CHG is an antiseptic soap that kills germs and continues to kill germs even after washing.   DO NOT use if you have an allergy to chlorhexidine /CHG or antibacterial soaps. If your skin becomes reddened or irritated, stop using the CHG and notify one of our RNs at [phone].   Please shower with the CHG soap starting 4 days before surgery using the following schedule:     Please keep in mind the following:  DO NOT shave, including legs  and underarms, starting the day of your first shower.   You may shave your face at any point before/day of surgery.  Place clean sheets on your bed the day you start using CHG soap. Use a clean washcloth (not used since being washed) for each shower. DO NOT sleep with pets once you start using the CHG.   CHG Shower Instructions:  Wash your face and private area with normal soap. If you choose to wash your hair, wash first with your normal shampoo.  After you use shampoo/soap, rinse your hair and body thoroughly to remove shampoo/soap residue.  Turn the water  OFF and apply  bottle of CHG soap to a CLEAN washcloth.  Apply CHG soap ONLY FROM YOUR NECK DOWN TO YOUR TOES (washing for 3-5 minutes)  DO NOT use CHG soap on face, private areas, open wounds, or sores.  Pay special attention to the area where your surgery is being performed.  If you are having back surgery, having someone wash your back for you may be helpful. Wait 2 minutes after CHG soap is applied, then you may rinse off the CHG soap.  Pat dry with a clean towel  Put on clean clothes/pajamas   If you choose to wear lotion, please use ONLY the CHG-compatible lotions that are listed below.  Additional instructions for the day of surgery:  If you choose, you may shower the morning of surgery with an antibacterial soap.  DO NOT APPLY any lotions, deodorants, cologne, or perfumes.   Do not bring valuables to the hospital. [facility] is not responsible for any belongings/valuables. Do not wear nail polish, gel polish, artificial nails, or any other type of covering on natural nails (fingers and toes) Do not wear jewelry or makeup Put on clean/comfortable clothes.  Please brush your teeth.  Ask your nurse before applying any prescription medications to the skin.     CHG Compatible Lotions   Aveeno Moisturizing lotion  Cetaphil Moisturizing Cream  Cetaphil Moisturizing Lotion  Clairol Herbal Essence Moisturizing Lotion, Dry  Skin  Clairol Herbal Essence Moisturizing Lotion, Extra Dry Skin  Clairol Herbal Essence Moisturizing Lotion, Normal Skin  Curel Age Defying Therapeutic Moisturizing Lotion with Alpha Hydroxy  Curel Extreme Care Body Lotion  Curel Soothing Hands Moisturizing Hand Lotion  Curel Therapeutic Moisturizing Cream, Fragrance-Free  Curel Therapeutic Moisturizing Lotion, Fragrance-Free  Curel Therapeutic Moisturizing Lotion, Original Formula  Eucerin Daily Replenishing Lotion  Eucerin Dry Skin Therapy Plus Alpha Hydroxy Crme  Eucerin Dry Skin Therapy Plus Alpha Hydroxy Lotion  Eucerin Original Crme  Eucerin Original Lotion  Eucerin Plus Crme Eucerin Plus Lotion  Eucerin TriLipid Replenishing Lotion  Keri Anti-Bacterial Hand Lotion  Keri Deep Conditioning Original Lotion Dry Skin Formula Softly Scented  Keri Deep Conditioning Original Lotion, Fragrance Free Sensitive Skin Formula  Keri Lotion Fast Absorbing Fragrance Free Sensitive Skin Formula  Keri  Lotion Fast Absorbing Softly Scented Dry Skin Formula  Keri Original Lotion  Keri Skin Renewal Lotion Keri Silky Smooth Lotion  Keri Silky Smooth Sensitive Skin Lotion  Nivea Body Creamy Conditioning Oil  Nivea Body Extra Enriched Teacher, Adult Education Moisturizing Lotion Nivea Crme  Nivea Skin Firming Lotion  NutraDerm 30 Skin Lotion  NutraDerm Skin Lotion  NutraDerm Therapeutic Skin Cream  NutraDerm Therapeutic Skin Lotion  ProShield Protective Hand Cream  Provon moisturizing lotion  Please read over the following fact sheets that you were given.

## 2024-02-24 ENCOUNTER — Encounter (HOSPITAL_COMMUNITY): Payer: Self-pay

## 2024-02-24 ENCOUNTER — Inpatient Hospital Stay (HOSPITAL_COMMUNITY)
Admission: RE | Admit: 2024-02-24 | Discharge: 2024-02-24 | Attending: Orthopaedic Surgery | Admitting: Orthopaedic Surgery

## 2024-02-24 ENCOUNTER — Other Ambulatory Visit: Payer: Self-pay | Admitting: Physician Assistant

## 2024-02-24 ENCOUNTER — Other Ambulatory Visit: Payer: Self-pay

## 2024-02-24 VITALS — BP 167/88 | HR 98 | Temp 97.9°F | Resp 18 | Ht 71.0 in | Wt 234.7 lb

## 2024-02-24 DIAGNOSIS — Z01818 Encounter for other preprocedural examination: Secondary | ICD-10-CM

## 2024-02-24 HISTORY — DX: Unspecified osteoarthritis, unspecified site: M19.90

## 2024-02-24 LAB — CBC
HCT: 48.3 % (ref 39.0–52.0)
Hemoglobin: 16.1 g/dL (ref 13.0–17.0)
MCH: 30.5 pg (ref 26.0–34.0)
MCHC: 33.3 g/dL (ref 30.0–36.0)
MCV: 91.5 fL (ref 80.0–100.0)
Platelets: 270 K/uL (ref 150–400)
RBC: 5.28 MIL/uL (ref 4.22–5.81)
RDW: 12.7 % (ref 11.5–15.5)
WBC: 6.8 K/uL (ref 4.0–10.5)
nRBC: 0 % (ref 0.0–0.2)

## 2024-02-24 LAB — BASIC METABOLIC PANEL WITH GFR
Anion gap: 12 (ref 5–15)
BUN: 11 mg/dL (ref 6–20)
CO2: 29 mmol/L (ref 22–32)
Calcium: 9.6 mg/dL (ref 8.9–10.3)
Chloride: 100 mmol/L (ref 98–111)
Creatinine, Ser: 1.07 mg/dL (ref 0.61–1.24)
GFR, Estimated: >60 mL/min (ref >60)
Glucose, Bld: 125 mg/dL — ABNORMAL HIGH (ref 70–99)
Potassium: 3.9 mmol/L (ref 3.5–5.1)
Sodium: 141 mmol/L (ref 135–145)

## 2024-02-24 LAB — SURGICAL PCR SCREEN
MRSA, PCR: NEGATIVE
Staphylococcus aureus: NEGATIVE

## 2024-02-24 LAB — TYPE AND SCREEN
ABO/RH(D): A POS
Antibody Screen: NEGATIVE

## 2024-02-24 MED ORDER — OXYCODONE-ACETAMINOPHEN 5-325 MG PO TABS
1.0000 | ORAL_TABLET | Freq: Four times a day (QID) | ORAL | 0 refills | Status: DC | PRN
  Filled 2024-02-24 – 2024-02-26 (×2): qty 40, 5d supply, fill #0

## 2024-02-24 MED ORDER — ONDANSETRON HCL 4 MG PO TABS
4.0000 mg | ORAL_TABLET | Freq: Three times a day (TID) | ORAL | 0 refills | Status: AC | PRN
  Filled 2024-02-24 – 2024-02-26 (×2): qty 40, 14d supply, fill #0

## 2024-02-24 MED ORDER — DOCUSATE SODIUM 100 MG PO CAPS
100.0000 mg | ORAL_CAPSULE | Freq: Every day | ORAL | 2 refills | Status: AC | PRN
  Filled 2024-02-24 – 2024-02-26 (×2): qty 30, 30d supply, fill #0

## 2024-02-24 MED ORDER — METHOCARBAMOL 750 MG PO TABS
750.0000 mg | ORAL_TABLET | Freq: Three times a day (TID) | ORAL | 2 refills | Status: AC | PRN
  Filled 2024-02-24 – 2024-02-26 (×2): qty 30, 10d supply, fill #0
  Filled 2024-03-10: qty 30, 10d supply, fill #1

## 2024-02-24 NOTE — Progress Notes (Signed)
 PCP - Merna Huxley, NP    Cardiologist -   PPM/ICD - denies Device Orders - n/a` Rep Notified - n/a  Chest x-ray - denies EKG - 01-26-24 Stress Test - denies ECHO - 08-16-21 Cardiac Cath - denies  Sleep Study - denies CPAP - n/a  Dm -denies  Blood Thinner Instructions:denies Aspirin Instructions:denies  ERAS Protcol - till 7:15 am. PRE-SURGERY Ensure   COVID TEST- n/a   Anesthesia review: no  Patient denies shortness of breath, fever, cough and chest pain at PAT appointment   All instructions explained to the patient, with a verbal understanding of the material. Patient agrees to go over the instructions while at home for a better understanding. Patient also instructed to self quarantine after being tested for COVID-19. The opportunity to ask questions was provided.

## 2024-02-26 ENCOUNTER — Other Ambulatory Visit: Payer: Self-pay

## 2024-02-26 ENCOUNTER — Other Ambulatory Visit (HOSPITAL_COMMUNITY): Payer: Self-pay

## 2024-02-26 MED ORDER — TRANEXAMIC ACID 1000 MG/10ML IV SOLN
2000.0000 mg | INTRAVENOUS | Status: DC
  Filled 2024-02-26: qty 20

## 2024-02-29 ENCOUNTER — Other Ambulatory Visit (HOSPITAL_COMMUNITY): Payer: Self-pay

## 2024-02-29 ENCOUNTER — Ambulatory Visit (HOSPITAL_COMMUNITY)

## 2024-02-29 ENCOUNTER — Observation Stay (HOSPITAL_COMMUNITY)

## 2024-02-29 ENCOUNTER — Encounter (HOSPITAL_COMMUNITY): Admission: RE | Disposition: A | Payer: Self-pay | Source: Home / Self Care | Attending: Orthopaedic Surgery

## 2024-02-29 ENCOUNTER — Observation Stay (HOSPITAL_COMMUNITY)
Admission: RE | Admit: 2024-02-29 | Discharge: 2024-03-01 | Disposition: A | Attending: Orthopaedic Surgery | Admitting: Orthopaedic Surgery

## 2024-02-29 ENCOUNTER — Ambulatory Visit (HOSPITAL_COMMUNITY): Payer: Self-pay | Admitting: Registered Nurse

## 2024-02-29 ENCOUNTER — Other Ambulatory Visit: Payer: Self-pay | Admitting: Physician Assistant

## 2024-02-29 DIAGNOSIS — Z96641 Presence of right artificial hip joint: Secondary | ICD-10-CM | POA: Diagnosis not present

## 2024-02-29 DIAGNOSIS — M1612 Unilateral primary osteoarthritis, left hip: Principal | ICD-10-CM | POA: Diagnosis present

## 2024-02-29 DIAGNOSIS — Z96642 Presence of left artificial hip joint: Secondary | ICD-10-CM

## 2024-02-29 DIAGNOSIS — R102 Pelvic and perineal pain unspecified side: Secondary | ICD-10-CM | POA: Diagnosis not present

## 2024-02-29 HISTORY — PX: TOTAL HIP ARTHROPLASTY: SHX124

## 2024-02-29 SURGERY — ARTHROPLASTY, HIP, TOTAL, ANTERIOR APPROACH
Anesthesia: Spinal | Site: Hip | Laterality: Left

## 2024-02-29 MED ORDER — ONDANSETRON HCL 4 MG/2ML IJ SOLN: 4.0000 mg | Freq: Four times a day (QID) | INTRAMUSCULAR | Status: DC | PRN

## 2024-02-29 MED ORDER — MEPIVACAINE HCL (PF) 2 % IJ SOLN
INTRAMUSCULAR | Status: DC | PRN
  Administered 2024-02-29: 3 mL via INTRATHECAL

## 2024-02-29 MED ORDER — DROPERIDOL 2.5 MG/ML IJ SOLN
0.6250 mg | Freq: Once | INTRAMUSCULAR | Status: AC | PRN
  Administered 2024-02-29: 0.625 mg via INTRAVENOUS

## 2024-02-29 MED ORDER — FENTANYL CITRATE (PF) 250 MCG/5ML IJ SOLN
INTRAMUSCULAR | Status: DC | PRN
  Administered 2024-02-29: 50 ug via INTRAVENOUS

## 2024-02-29 MED ORDER — DEXAMETHASONE SOD PHOSPHATE PF 10 MG/ML IJ SOLN: 10.0000 mg | Freq: Once | INTRAMUSCULAR | Status: DC

## 2024-02-29 MED ORDER — BUPIVACAINE-MELOXICAM ER 400-12 MG/14ML IJ SOLN
INTRAMUSCULAR | Status: DC | PRN
  Administered 2024-02-29: 400 mg

## 2024-02-29 MED ORDER — NORTRIPTYLINE HCL 25 MG PO CAPS
75.0000 mg | ORAL_CAPSULE | Freq: Every day | ORAL | Status: DC
  Administered 2024-02-29: 75 mg via ORAL
  Filled 2024-02-29 (×2): qty 3

## 2024-02-29 MED ORDER — ACETAMINOPHEN 325 MG PO TABS: 325.0000 mg | ORAL_TABLET | Freq: Four times a day (QID) | ORAL | Status: DC | PRN

## 2024-02-29 MED ORDER — PANTOPRAZOLE SODIUM 40 MG PO TBEC
40.0000 mg | DELAYED_RELEASE_TABLET | Freq: Every day | ORAL | Status: DC
  Administered 2024-02-29 – 2024-03-01 (×2): 40 mg via ORAL
  Filled 2024-02-29 (×2): qty 1

## 2024-02-29 MED ORDER — ALUM & MAG HYDROXIDE-SIMETH 200-200-20 MG/5ML PO SUSP
30.0000 mL | ORAL | Status: DC | PRN
  Administered 2024-03-01: 30 mL via ORAL
  Filled 2024-02-29: qty 30

## 2024-02-29 MED ORDER — SODIUM CHLORIDE 0.9 % IR SOLN
Status: DC | PRN
  Administered 2024-02-29: 1000 mL

## 2024-02-29 MED ORDER — PHENOL 1.4 % MT LIQD: 1.0000 | OROMUCOSAL | Status: DC | PRN

## 2024-02-29 MED ORDER — BUPIVACAINE-MELOXICAM ER 400-12 MG/14ML IJ SOLN
INTRAMUSCULAR | Status: AC
  Filled 2024-02-29: qty 1

## 2024-02-29 MED ORDER — APIXABAN 2.5 MG PO TABS
2.5000 mg | ORAL_TABLET | Freq: Two times a day (BID) | ORAL | Status: DC
  Administered 2024-03-01: 2.5 mg via ORAL
  Filled 2024-02-29 (×2): qty 1

## 2024-02-29 MED ORDER — APIXABAN 2.5 MG PO TABS
2.5000 mg | ORAL_TABLET | Freq: Two times a day (BID) | ORAL | 0 refills | Status: DC
  Filled 2024-02-29: qty 60, 30d supply, fill #0

## 2024-02-29 MED ORDER — SORBITOL 70 % SOLN: 30.0000 mL | Freq: Every day | Status: DC | PRN

## 2024-02-29 MED ORDER — METOCLOPRAMIDE HCL 5 MG/ML IJ SOLN: 5.0000 mg | Freq: Three times a day (TID) | INTRAMUSCULAR | Status: DC | PRN

## 2024-02-29 MED ORDER — OXYCODONE HCL 5 MG PO TABS: 5.0000 mg | ORAL_TABLET | Freq: Once | ORAL | Status: DC | PRN

## 2024-02-29 MED ORDER — OXYCODONE HCL 5 MG/5ML PO SOLN: 5.0000 mg | Freq: Once | ORAL | Status: DC | PRN

## 2024-02-29 MED ORDER — ORAL CARE MOUTH RINSE: 15.0000 mL | Freq: Once | OROMUCOSAL | Status: AC

## 2024-02-29 MED ORDER — ACETAMINOPHEN 10 MG/ML IV SOLN
INTRAVENOUS | Status: AC
  Filled 2024-02-29: qty 100

## 2024-02-29 MED ORDER — NICOTINE 21 MG/24HR TD PT24
21.0000 mg | MEDICATED_PATCH | Freq: Every day | TRANSDERMAL | Status: DC
  Administered 2024-02-29 – 2024-03-01 (×2): 21 mg via TRANSDERMAL
  Filled 2024-02-29 (×2): qty 1

## 2024-02-29 MED ORDER — MAGNESIUM CITRATE PO SOLN: 1.0000 | Freq: Once | ORAL | Status: DC | PRN

## 2024-02-29 MED ORDER — PROPOFOL 500 MG/50ML IV EMUL
INTRAVENOUS | Status: DC | PRN
  Administered 2024-02-29: 100 ug/kg/min via INTRAVENOUS

## 2024-02-29 MED ORDER — LACTATED RINGERS IV SOLN: INTRAVENOUS | Status: DC

## 2024-02-29 MED ORDER — TRANEXAMIC ACID 1000 MG/10ML IV SOLN
INTRAVENOUS | Status: DC | PRN
  Administered 2024-02-29: 2000 mg via TOPICAL

## 2024-02-29 MED ORDER — HYDROCODONE-ACETAMINOPHEN 5-325 MG PO TABS: 1.0000 | ORAL_TABLET | Freq: Three times a day (TID) | ORAL | Status: DC | PRN

## 2024-02-29 MED ORDER — ACETAMINOPHEN 500 MG PO TABS
1000.0000 mg | ORAL_TABLET | Freq: Four times a day (QID) | ORAL | Status: AC
  Administered 2024-02-29 – 2024-03-01 (×4): 1000 mg via ORAL
  Filled 2024-02-29 (×4): qty 2

## 2024-02-29 MED ORDER — PHENYLEPHRINE HCL-NACL 20-0.9 MG/250ML-% IV SOLN
INTRAVENOUS | Status: DC | PRN
  Administered 2024-02-29: 30 ug/min via INTRAVENOUS

## 2024-02-29 MED ORDER — METOCLOPRAMIDE HCL 5 MG PO TABS: 5.0000 mg | ORAL_TABLET | Freq: Three times a day (TID) | ORAL | Status: DC | PRN

## 2024-02-29 MED ORDER — POLYETHYLENE GLYCOL 3350 17 G PO PACK
17.0000 g | PACK | Freq: Every day | ORAL | Status: DC
  Administered 2024-03-01: 17 g via ORAL
  Filled 2024-02-29: qty 1

## 2024-02-29 MED ORDER — ONDANSETRON HCL 4 MG/2ML IJ SOLN
INTRAMUSCULAR | Status: DC | PRN
  Administered 2024-02-29: 4 mg via INTRAVENOUS

## 2024-02-29 MED ORDER — DOCUSATE SODIUM 100 MG PO CAPS
100.0000 mg | ORAL_CAPSULE | Freq: Two times a day (BID) | ORAL | Status: DC
  Administered 2024-02-29 – 2024-03-01 (×2): 100 mg via ORAL
  Filled 2024-02-29 (×2): qty 1

## 2024-02-29 MED ORDER — METHOCARBAMOL 1000 MG/10ML IJ SOLN: 500.0000 mg | Freq: Four times a day (QID) | INTRAMUSCULAR | Status: DC | PRN

## 2024-02-29 MED ORDER — DEXAMETHASONE SOD PHOSPHATE PF 10 MG/ML IJ SOLN
INTRAMUSCULAR | Status: DC | PRN
  Administered 2024-02-29: 5 mg via INTRAVENOUS

## 2024-02-29 MED ORDER — VANCOMYCIN HCL 1000 MG IV SOLR
INTRAVENOUS | Status: AC
  Filled 2024-02-29: qty 20

## 2024-02-29 MED ORDER — FENTANYL CITRATE (PF) 100 MCG/2ML IJ SOLN
25.0000 ug | INTRAMUSCULAR | Status: DC | PRN
  Administered 2024-02-29: 25 ug via INTRAVENOUS

## 2024-02-29 MED ORDER — MIDAZOLAM HCL 2 MG/2ML IJ SOLN
INTRAMUSCULAR | Status: AC
  Filled 2024-02-29: qty 2

## 2024-02-29 MED ORDER — PHENYLEPHRINE 80 MCG/ML (10ML) SYRINGE FOR IV PUSH (FOR BLOOD PRESSURE SUPPORT)
PREFILLED_SYRINGE | INTRAVENOUS | Status: DC | PRN
  Administered 2024-02-29: 80 ug via INTRAVENOUS

## 2024-02-29 MED ORDER — VANCOMYCIN HCL 1 G IV SOLR
INTRAVENOUS | Status: DC | PRN
  Administered 2024-02-29: 1000 mg via TOPICAL

## 2024-02-29 MED ORDER — FENTANYL CITRATE (PF) 100 MCG/2ML IJ SOLN
INTRAMUSCULAR | Status: AC
  Filled 2024-02-29: qty 2

## 2024-02-29 MED ORDER — DROPERIDOL 2.5 MG/ML IJ SOLN
INTRAMUSCULAR | Status: AC
  Filled 2024-02-29: qty 2

## 2024-02-29 MED ORDER — PHENYLEPHRINE 80 MCG/ML (10ML) SYRINGE FOR IV PUSH (FOR BLOOD PRESSURE SUPPORT)
PREFILLED_SYRINGE | INTRAVENOUS | Status: AC
  Filled 2024-02-29: qty 10

## 2024-02-29 MED ORDER — 0.9 % SODIUM CHLORIDE (POUR BTL) OPTIME
TOPICAL | Status: DC | PRN
  Administered 2024-02-29: 1000 mL

## 2024-02-29 MED ORDER — CEFAZOLIN SODIUM-DEXTROSE 2-4 GM/100ML-% IV SOLN
2.0000 g | Freq: Four times a day (QID) | INTRAVENOUS | Status: AC
  Administered 2024-02-29 (×2): 2 g via INTRAVENOUS
  Filled 2024-02-29 (×2): qty 100

## 2024-02-29 MED ORDER — SODIUM CHLORIDE 0.9 % IV SOLN: INTRAVENOUS | Status: DC

## 2024-02-29 MED ORDER — PRONTOSAN WOUND IRRIGATION OPTIME
TOPICAL | Status: DC | PRN
  Administered 2024-02-29: 1 via TOPICAL

## 2024-02-29 MED ORDER — CEFAZOLIN SODIUM-DEXTROSE 2-4 GM/100ML-% IV SOLN
2.0000 g | INTRAVENOUS | Status: AC
  Administered 2024-02-29: 2 g via INTRAVENOUS
  Filled 2024-02-29: qty 100

## 2024-02-29 MED ORDER — MIDAZOLAM HCL (PF) 2 MG/2ML IJ SOLN
INTRAMUSCULAR | Status: DC | PRN
  Administered 2024-02-29: 2 mg via INTRAVENOUS

## 2024-02-29 MED ORDER — MORPHINE SULFATE (PF) 2 MG/ML IV SOLN: 0.5000 mg | Freq: Four times a day (QID) | INTRAVENOUS | Status: DC | PRN

## 2024-02-29 MED ORDER — CHLORHEXIDINE GLUCONATE 0.12 % MT SOLN
15.0000 mL | Freq: Once | OROMUCOSAL | Status: AC
  Administered 2024-02-29: 15 mL via OROMUCOSAL
  Filled 2024-02-29: qty 15

## 2024-02-29 MED ORDER — TAMSULOSIN HCL 0.4 MG PO CAPS
0.4000 mg | ORAL_CAPSULE | Freq: Every day | ORAL | Status: DC
  Administered 2024-02-29 – 2024-03-01 (×2): 0.4 mg via ORAL
  Filled 2024-02-29 (×2): qty 1

## 2024-02-29 MED ORDER — POVIDONE-IODINE 10 % EX SWAB
2.0000 | Freq: Once | CUTANEOUS | Status: AC
  Administered 2024-02-29: 2 via TOPICAL

## 2024-02-29 MED ORDER — METHOCARBAMOL 500 MG PO TABS
500.0000 mg | ORAL_TABLET | Freq: Four times a day (QID) | ORAL | Status: DC | PRN
  Administered 2024-02-29 – 2024-03-01 (×3): 500 mg via ORAL
  Filled 2024-02-29 (×4): qty 1

## 2024-02-29 MED ORDER — ONDANSETRON HCL 4 MG PO TABS: 4.0000 mg | ORAL_TABLET | Freq: Four times a day (QID) | ORAL | Status: DC | PRN

## 2024-02-29 MED ORDER — MENTHOL 3 MG MT LOZG: 1.0000 | LOZENGE | OROMUCOSAL | Status: DC | PRN

## 2024-02-29 MED ORDER — HYDROCODONE-ACETAMINOPHEN 7.5-325 MG PO TABS
1.0000 | ORAL_TABLET | Freq: Three times a day (TID) | ORAL | Status: DC | PRN
  Administered 2024-02-29: 2 via ORAL
  Filled 2024-02-29: qty 2

## 2024-02-29 MED ORDER — ONDANSETRON HCL 4 MG/2ML IJ SOLN
INTRAMUSCULAR | Status: AC
  Filled 2024-02-29: qty 2

## 2024-02-29 MED ORDER — OXYCODONE HCL 5 MG PO TABS
5.0000 mg | ORAL_TABLET | ORAL | Status: DC | PRN
  Administered 2024-02-29 – 2024-03-01 (×2): 5 mg via ORAL
  Administered 2024-03-01: 10 mg via ORAL
  Administered 2024-03-01: 5 mg via ORAL
  Filled 2024-02-29: qty 2
  Filled 2024-02-29 (×3): qty 1

## 2024-02-29 MED ORDER — DIPHENHYDRAMINE HCL 12.5 MG/5ML PO ELIX: 25.0000 mg | ORAL_SOLUTION | ORAL | Status: DC | PRN

## 2024-02-29 MED ORDER — ACETAMINOPHEN 10 MG/ML IV SOLN
1000.0000 mg | Freq: Once | INTRAVENOUS | Status: DC | PRN
  Administered 2024-02-29: 1000 mg via INTRAVENOUS

## 2024-02-29 MED ORDER — TRANEXAMIC ACID-NACL 1000-0.7 MG/100ML-% IV SOLN
1000.0000 mg | INTRAVENOUS | Status: AC
  Administered 2024-02-29: 1000 mg via INTRAVENOUS
  Filled 2024-02-29: qty 100

## 2024-02-29 MED ORDER — TRANEXAMIC ACID-NACL 1000-0.7 MG/100ML-% IV SOLN
1000.0000 mg | Freq: Once | INTRAVENOUS | Status: AC
  Administered 2024-02-29: 1000 mg via INTRAVENOUS
  Filled 2024-02-29: qty 100

## 2024-02-29 SURGICAL SUPPLY — 48 items
BAG COUNTER SPONGE SURGICOUNT (BAG) ×1 IMPLANT
BAG DECANTER FOR FLEXI CONT (MISCELLANEOUS) ×1 IMPLANT
BLADE SAG 18X100X1.27 (BLADE) ×1 IMPLANT
COVER PERINEAL POST (MISCELLANEOUS) ×1 IMPLANT
COVER SURGICAL LIGHT HANDLE (MISCELLANEOUS) ×1 IMPLANT
CUP ACETAB W/GRIPTION 54 (Plate) IMPLANT
DERMABOND ADVANCED .7 DNX12 (GAUZE/BANDAGES/DRESSINGS) IMPLANT
DRAPE C-ARM 42X72 X-RAY (DRAPES) ×1 IMPLANT
DRAPE POUCH INSTRU U-SHP 10X18 (DRAPES) ×1 IMPLANT
DRAPE STERI IOBAN 125X83 (DRAPES) ×1 IMPLANT
DRAPE U-SHAPE 47X51 STRL (DRAPES) ×2 IMPLANT
DRSG AQUACEL AG ADV 3.5X10 (GAUZE/BANDAGES/DRESSINGS) ×1 IMPLANT
DURAPREP 26ML APPLICATOR (WOUND CARE) ×2 IMPLANT
ELECTRODE BLDE 4.0 EZ CLN MEGD (MISCELLANEOUS) ×1 IMPLANT
ELECTRODE REM PT RTRN 9FT ADLT (ELECTROSURGICAL) ×1 IMPLANT
GLOVE BIOGEL PI IND STRL 7.0 (GLOVE) ×2 IMPLANT
GLOVE BIOGEL PI IND STRL 7.5 (GLOVE) ×1 IMPLANT
GLOVE BIOGEL PI MICRO STRL 7 (GLOVE) ×5 IMPLANT
GLOVE INDICATOR 7.0 STRL GRN (GLOVE) ×1 IMPLANT
GLOVE SURG SYN 7.5 PF PI (GLOVE) ×5 IMPLANT
GOWN STRL REUS W/ TWL LRG LVL3 (GOWN DISPOSABLE) IMPLANT
GOWN STRL REUS W/ TWL XL LVL3 (GOWN DISPOSABLE) ×1 IMPLANT
GOWN STRL SURGICAL XL XLNG (GOWN DISPOSABLE) ×1 IMPLANT
GOWN TOGA ZIPPER T7+ PEEL AWAY (MISCELLANEOUS) ×1 IMPLANT
HEAD CERAMIC DELTA 28 P1.5 HIP (Head) IMPLANT
HOOD PEEL AWAY T7 (MISCELLANEOUS) ×1 IMPLANT
IV 0.9% NACL 1000 ML (IV SOLUTION) ×1 IMPLANT
KIT BASIN OR (CUSTOM PROCEDURE TRAY) ×1 IMPLANT
LINER DM PINNACLE 54/47 (Liner) IMPLANT
LINER FEM BM HIP ALTRX 47X28 (Liner) IMPLANT
MARKER SKIN DUAL TIP RULER LAB (MISCELLANEOUS) ×1 IMPLANT
NDL SPNL 18GX3.5 QUINCKE PK (NEEDLE) ×1 IMPLANT
PACK TOTAL JOINT (CUSTOM PROCEDURE TRAY) ×1 IMPLANT
PACK UNIVERSAL I (CUSTOM PROCEDURE TRAY) ×1 IMPLANT
SCREW 6.5MMX25MM (Screw) IMPLANT
SET HNDPC FAN SPRY TIP SCT (DISPOSABLE) ×1 IMPLANT
SOLUTION PRONTOSAN WOUND 350ML (IRRIGATION / IRRIGATOR) ×1 IMPLANT
STEM FEMORAL SZ5 HIGH ACTIS (Stem) IMPLANT
SUT ETHIBOND 2 V 37 (SUTURE) ×1 IMPLANT
SUT STRATAFIX PDS+ 0 24IN (SUTURE) IMPLANT
SUT VIC AB 0 CT1 27XBRD ANBCTR (SUTURE) ×1 IMPLANT
SUT VIC AB 1 CTX36XBRD ANBCTR (SUTURE) ×1 IMPLANT
SUT VIC AB 2-0 CT1 TAPERPNT 27 (SUTURE) ×2 IMPLANT
SYR 30ML LL (SYRINGE) ×2 IMPLANT
TOWEL GREEN STERILE (TOWEL DISPOSABLE) ×1 IMPLANT
TRAY FOLEY W/BAG SLVR 16FR ST (SET/KITS/TRAYS/PACK) IMPLANT
TUBE SUCT ARGYLE STRL (TUBING) ×1 IMPLANT
YANKAUER SUCT BULB TIP NO VENT (SUCTIONS) ×1 IMPLANT

## 2024-02-29 NOTE — Op Note (Signed)
 ARTHROPLASTY, HIP, TOTAL, ANTERIOR APPROACH  Procedure Note Shane Cole   [id number]  Pre-op Diagnosis: left hip osteoarthritis     Post-op Diagnosis: same  Operative Findings Chondromalacia of weight bearing surface of femoral head and superior acetabulum   Operative Procedures  1. Total hip replacement; Left hip; uncemented [id number]   Surgeon: Kay Cummins, M.D.  Assist: Ronal Morna Grave, PA-C   Anesthesia: spinal  Prosthesis: Depuy Acetabulum: Pinnacle 54 mm Femur: Actis 5 HO Head: 47/28 mm size: +1.5 Liner: dual mobility liner Bearing Type: dual mobility  Total Hip Arthroplasty (Anterior Approach) Op Note:  After informed consent was obtained and the operative extremity marked in the holding area, the patient was brought back to the operating room and placed supine on the HANA table. Next, the operative extremity was prepped and draped in normal sterile fashion. Surgical timeout occurred verifying patient identification, surgical site, surgical procedure and administration of antibiotics.  A 10 cm longitudinal incision was made starting from 2 fingerbreadths lateral and inferior to the ASIS towards the lateral aspect of the patella.  A Hueter approach to the hip was performed, using the interval between tensor fascia lata and sartorius.  Dissection was carried bluntly down onto the anterior hip capsule. The lateral femoral circumflex vessels were identified and coagulated. A capsulotomy was performed and the capsular flaps tagged for later repair.  The neck osteotomy was performed 1 fingerbreadth above the lesser trochanter. The femoral head was removed, the acetabular rim was cleared of soft tissue and osteophytes and attention was turned to reaming the acetabulum.  Sequential reaming was performed under fluoroscopic guidance down to the floor of the cotyloid fossa. We reamed to a size 53 mm, and then impacted the acetabular shell. A 25 mm cancellous screw was  placed to secure the shell.  A dual mobility liner was then placed after irrigation and attention turned to the femur.  After placing the femoral hook, the leg was taken to externally rotated, extended and adducted position taking care to perform soft tissue releases to allow for adequate mobilization of the femur. Soft tissue was cleared from the shoulder of the greater trochanter and the hook elevator used to improve exposure of the proximal femur.  Lateral bone from the shoulder was rasped away for relief.  Sequential broaching performed up to a size 5.  Hight offset trial neck and +1.5 head were placed. The leg was brought back up to neutral and the construct reduced.  The position and sizing of components, offset and leg lengths were checked using fluoroscopy.  Based on fluoroscopic findings, we chose to go with it.  Stability of the construct was checked in 45 degrees of hip extension and 90 degrees of external rotation without any subluxation, shuck or impingement of prosthesis. We dislocated the prosthesis, dropped the leg back into position, removed trial components, and irrigated copiously. The final stem and head were chosen then placed, the leg brought back up, the system reduced and fluoroscopy used to verify positioning.  Antibiotic irrigation was placed in the surgical wound.   We irrigated, obtained hemostasis and closed the capsule using #2 ethibond suture.  A topical mixture of 0.25% bupivacaine  and meloxicam  was placed deep to the fascia.  One gram of vancomycin  powder was placed in the surgical bed.   One gram of topical tranexamic acid  was injected into the joint.  The fascia was closed with #1 stratafix, the deep fat layer was closed with 0 vicryl, the subcutaneous layers  closed with 2.0 Vicryl Plus and the skin closed with 2.0 nylon and dermabond. A sterile dressing was applied. The patient was awakened in the operating room and taken to recovery in stable condition.  All sponge, needle,  and instrument counts were correct at the end of the case.   Morna Grave, my PA, was a medical necessity for opening, closing, limb positioning, retracting, exposing, and overall facilitation and timely completion of the surgery.  Position: supine  Complications: see description of procedure.  Time Out: performed   Drains/Packing: none  Estimated blood loss: see anesthesia record  Returned to Recovery Room: in good condition.   Antibiotics: yes   Mechanical VTE (DVT) Prophylaxis: sequential compression devices, TED thigh-high  Chemical VTE (DVT) Prophylaxis: eliquis  POD 1   Fluid Replacement: see anesthesia record  Specimens Removed: 1 to pathology   Sponge and Instrument Count Correct? yes   PACU: portable radiograph - low AP   Plan/RTC: Return in 2 weeks for suture removal. Weight Bearing/Load Lower Extremity: full  Hip precautions: none Suture Removal: 2 weeks   N. Ozell Cummins, MD [facility] 11:27 AM   Implant Name Type Inv. Item Serial No. Manufacturer Lot No. LRB No. Used Action  CUP ACETAB W/GRIPTION 54 - [id number] Plate CUP ACETAB W/GRIPTION 54  [employer] [id number] Left 1 Implanted  LINER DM PINNACLE 54/47 - [id number] Liner LINER DM PINNACLE 54/47  [employer] [id number] Left 1 Implanted  SCREW 6.5MMX25MM - [id number] Screw SCREW 6.5MMX25MM  [employer] [id number] Left 1 Implanted  HEAD CERAMIC DELTA 28 P1.5 HIP - [id number] Head HEAD CERAMIC DELTA 28 P1.5 HIP  [employer] [id number] Left 1 Implanted  LINER FEM BM HIP ALTRX 47X28 - [id number] Liner LINER FEM BM HIP ALTRX 47X28  [employer] [id number] Left 1 Implanted  STEM FEMORAL SZ5 HIGH ACTIS - [id number] Stem STEM FEMORAL SZ5 HIGH ACTIS  [employer] [id number] Left 1 Implanted

## 2024-02-29 NOTE — Anesthesia Postprocedure Evaluation (Signed)
 Anesthesia Post Note  Patient: Shane D Smaldone Jr.  Procedure(s) Performed: ARTHROPLASTY, HIP, TOTAL, ANTERIOR APPROACH (Left: Hip)     Patient location during evaluation: PACU Anesthesia Type: Spinal Level of consciousness: oriented and awake and alert Pain management: pain level controlled Vital Signs Assessment: post-procedure vital signs reviewed and stable Respiratory status: spontaneous breathing, respiratory function stable and patient connected to nasal cannula oxygen Cardiovascular status: blood pressure returned to baseline and stable Postop Assessment: no headache, no backache and no apparent nausea or vomiting Anesthetic complications: no   There were no known notable events for this encounter.  Last Vitals:  Vitals:   02/29/24 1230 02/29/24 1245  BP: 107/65 106/73  Pulse: 73 63  Resp: 15 10  Temp:    SpO2: 97% 98%    Last Pain:  Vitals:   02/29/24 1230  PainSc: 0-No pain                 Rome Ade

## 2024-02-29 NOTE — Transfer of Care (Signed)
 Immediate Anesthesia Transfer of Care Note  Patient: Shane D Kreager Jr.  Procedure(s) Performed: ARTHROPLASTY, HIP, TOTAL, ANTERIOR APPROACH (Left: Hip)  Patient Location: PACU  Anesthesia Type:Spinal  Level of Consciousness: alert , oriented, and drowsy  Airway & Oxygen Therapy: Patient Spontanous Breathing  Post-op Assessment: Report given to RN and Post -op Vital signs reviewed and stable  Post vital signs: Reviewed and stable  Last Vitals:  Vitals Value Taken Time  BP 104/65 02/29/24 12:00  Temp 36.7 C 02/29/24 12:00  Pulse 75 02/29/24 12:02  Resp 11 02/29/24 12:02  SpO2 95 % 02/29/24 12:02  Vitals shown include unfiled device data.  Last Pain:  Vitals:   02/29/24 0843  PainSc: 3       Patients Stated Pain Goal: 0 (02/29/24 0843)  Complications: There were no known notable events for this encounter.

## 2024-02-29 NOTE — Discharge Instructions (Signed)

## 2024-02-29 NOTE — H&P (Signed)
 PREOPERATIVE H&P  Chief Complaint: left hip osteoarthritis  HPI: Shane D Beigel Jr. is a 43 y.o. male who presents for surgical treatment of left hip osteoarthritis.  He denies any changes in medical history.  Past Surgical History:  Procedure Laterality Date   CHOLECYSTECTOMY N/A 01/24/2014   Procedure: LAPAROSCOPIC CHOLECYSTECTOMY;  Surgeon: Lynda Leos, MD;  Location: [facility] OR;  Service: General;  Laterality: N/A;   COLONOSCOPY     HAND SURGERY     MASS EXCISION Left 01/25/2013   Procedure: EXCISION MASS DORSAL ASPECT LEFT LONG FINGER DISTAL INTERPHALANGEAL JOINT;  Surgeon: Lamar LULLA Leonor Mickey., MD;  Location: [facility];  Service: Orthopedics;  Laterality: Left;  Left long    MOUTH SURGERY     TOTAL HIP ARTHROPLASTY Right 12/03/2020   Procedure: TOTAL HIP ARTHROPLASTY ANTERIOR APPROACH;  Surgeon: Jerri Kay HERO, MD;  Location: [facility] OR;  Service: Orthopedics;  Laterality: Right;  3-C   Social History   Socioeconomic History   Marital status: Married    Spouse name: Not on file   Number of children: 1   Years of education: 10th grade   Highest education level: GED or equivalent  Occupational History   Occupation: Disabled    Comment: Previously worked web designer  Tobacco Use   Smoking status: Every Day    Current packs/day: 0.25    Average packs/day: 0.3 packs/day for 23.0 years (5.8 ttl pk-yrs)    Types: Cigarettes   Smokeless tobacco: Never   Tobacco comments:    Using nicotine  patch  Vaping Use   Vaping status: Some Days   Substances: Nicotine   Substance and Sexual Activity   Alcohol  use: Yes    Alcohol /week: 6.0 standard drinks of alcohol     Types: 6 Cans of beer per week    Comment: occasional   Drug use: No   Sexual activity: Yes    Partners: Female  Other Topics Concern   Not on file  Social History Narrative   05/24/12  Syrus was born and grew up in [place], [place] .    He has 2 sisters.    He reports that his  childhood was lousy.    He completed the 10th grade.    He has been married for 5 years, and is currently separated for 3 weeks.    He has a daughter who is 33-1/2 years old.    He has been unemployed for 2 years, and is disabled due to an on-the-job work accident.    He is currently living with his aunt and cousin.    He reports that his hobbies are sports and dogs.    He reports that he is spiritual, but not religious.    He states that his aunt and his father are his social support system.    He denies any current legal problems, but got a DUI in 2007. 05/24/12 [facility]      Social Drivers of Health   Financial Resource Strain: Medium Risk (01/26/2024)   Overall Financial Resource Strain (CARDIA)    Difficulty of Paying Living Expenses: Somewhat hard  Food Insecurity: Food Insecurity Present (01/26/2024)   Hunger Vital Sign    Worried About Running Out of Food in the Last Year: Sometimes true    Ran Out of Food in the Last Year: Never true  Transportation Needs: No Transportation Needs (01/26/2024)   PRAPARE - Administrator, Civil Service (Medical): No  Lack of Transportation (Non-Medical): No  Physical Activity: Insufficiently Active (01/26/2024)   Exercise Vital Sign    Days of Exercise per Week: 2 days    Minutes of Exercise per Session: 10 min  Stress: No Stress Concern Present (01/26/2024)   [employer] of Occupational Health - Occupational Stress Questionnaire    Feeling of Stress: Only a little  Social Connections: Socially Integrated (01/26/2024)   Social Connection and Isolation Panel    Frequency of Communication with Friends and Family: Twice a week    Frequency of Social Gatherings with Friends and Family: Twice a week    Attends Religious Services: More than 4 times per year    Active Member of [employer] or Organizations: Yes    Attends Banker Meetings: 1 to 4 times per year    Marital Status: Married   Family History  Problem Relation Age  of Onset   Hyperlipidemia Father    Hypertension Father    Anxiety disorder Father    Drug abuse Father    Cancer Paternal Grandfather        lung, colon   Colon cancer Paternal Grandfather    Lung cancer Paternal Grandfather    Diabetes Paternal Grandmother    Cancer Paternal Grandmother    Cancer Maternal Grandmother    Cancer Maternal Grandfather    Lung cancer Maternal Grandfather    Stroke Other    Heart disease Other    Depression Paternal Aunt    Anxiety disorder Paternal Aunt    Drug abuse Paternal Uncle    Colon cancer Paternal Uncle    Esophageal cancer Neg Hx    Stomach cancer Neg Hx    Rectal cancer Neg Hx    Allergies  Allergen Reactions   Aspirin Shortness Of Breath and Tinitus    Tinnitus, dizzy, short of breath   Codeine Itching    Reaction to tylenol  #3   Penicillins Anaphylaxis and Other (See Comments)    Was told never to take medication.  Has patient had a PCN reaction causing immediate rash, facial/tongue/throat swelling, SOB or lightheadedness with hypotension: no Has patient had a PCN reaction causing severe rash involving mucus membranes or skin necrosis: no Has patient had a PCN reaction that required hospitalization: no Has patient had a PCN reaction occurring within the last 10 years: no If all of the above answers are NO, then may proceed with Cephalosporin use.    Tramadol      seizure    Amitriptyline  Rash   Prior to Admission medications   Medication Sig Start Date End Date Taking? Authorizing Provider  famotidine  (PEPCID ) 20 MG tablet Take 20 mg by mouth in the morning, at noon, and at bedtime.   Yes [provider]  gabapentin  (NEURONTIN ) 300 MG capsule Take 300-600 mg by mouth See admin instructions. Take 300 mg by mouth in the morning and 600 mg at night 08/26/16  Yes [provider]  lidocaine  (LIDODERM ) 5 % Place 2 patches onto the skin daily. Remove & Discard patch within 12 hours or as directed by MD 08/07/22  Yes  Nafziger, Darleene, NP  lidocaine  (XYLOCAINE ) 5 % ointment Apply topically as needed for moderate pain. 04/15/22  Yes Nafziger, Darleene, NP  Multiple Vitamin (MULTIVITAMIN WITH MINERALS) TABS tablet Take 1 tablet by mouth daily.   Yes [provider]  Nerve Stimulator (TENS THERAPY PAIN RELIEF) DEVI 1 application by Does not apply route daily as needed (chronic pain).   Yes [provider]  nortriptyline  (PAMELOR ) 75 MG capsule Take 1 capsule (75 mg total) by mouth at bedtime. 05/07/23  Yes Nafziger, Darleene, NP  albuterol  (VENTOLIN  HFA) 108 (90 Base) MCG/ACT inhaler Inhale 2 puffs into the lungs every 6 (six) hours as needed for wheezing or shortness of breath. Patient not taking: Reported on 02/23/2024 03/12/23   Merna Darleene, NP  docusate sodium  (COLACE) 100 MG capsule Take 1 capsule (100 mg total) by mouth daily as needed. 02/24/24 02/23/25  Jule Ronal CROME, PA-C  ibuprofen  (ADVIL ) 800 MG tablet Take 1 tablet (800 mg total) by mouth 3 (three) times daily as needed. Patient not taking: Reported on 02/23/2024 03/26/22     methocarbamol  (ROBAXIN ) 750 MG tablet Take 1 tablet (750 mg total) by mouth 3 (three) times daily as needed. 02/24/24   Jule Ronal CROME, PA-C  ondansetron  (ZOFRAN ) 4 MG tablet Take 1 tablet (4 mg total) by mouth every 8 (eight) hours as needed for nausea or vomiting. 02/24/24   Jule Ronal CROME, PA-C  oxyCODONE -acetaminophen  (PERCOCET) 5-325 MG tablet Take 1-2 tablets by mouth every 6 (six) hours as needed. To be taken after surgery 02/24/24   Jule Ronal CROME, PA-C     Positive ROS: All other systems have been reviewed and were otherwise negative with the exception of those mentioned in the HPI and as above.  Physical Exam: General: Alert, no acute distress Cardiovascular: No pedal edema Respiratory: No cyanosis, no use of accessory musculature GI: abdomen soft Skin: No lesions in the area of chief complaint Neurologic: Sensation intact distally Psychiatric: Patient is  competent for consent with normal mood and affect Lymphatic: no lymphedema  MUSCULOSKELETAL: exam stable  Assessment: left hip osteoarthritis  Plan: Plan for Procedure(s): ARTHROPLASTY, HIP, TOTAL, ANTERIOR APPROACH  The risks benefits and alternatives were discussed with the patient including but not limited to the risks of nonoperative treatment, versus surgical intervention including infection, bleeding, nerve injury,  blood clots, cardiopulmonary complications, morbidity, mortality, among others, and they were willing to proceed.   Ozell Cummins, MD 02/29/2024 8:38 AM

## 2024-02-29 NOTE — Anesthesia Preprocedure Evaluation (Signed)
 Anesthesia Evaluation  Patient identified by MRN, date of birth, ID band Patient awake    Reviewed: Allergy & Precautions, NPO status , Patient's Chart, lab work & pertinent test results  History of Anesthesia Complications Negative for: history of anesthetic complications  Airway Mallampati: II  TM Distance: >3 FB Neck ROM: Full    Dental  (+) Edentulous Upper, Edentulous Lower   Pulmonary neg sleep apnea, neg COPD, Current SmokerPatient did not abstain from smoking.   Pulmonary exam normal breath sounds clear to auscultation       Cardiovascular Exercise Tolerance: Good METShypertension, Pt. on medications (-) CAD and (-) Past MI (-) dysrhythmias  Rhythm:Regular Rate:Normal - Systolic murmurs    Neuro/Psych  Headaches PSYCHIATRIC DISORDERS Anxiety Depression Bipolar Disorder   Existing left leg weakness/numbness from injury in past  Neuromuscular disease    GI/Hepatic hiatal hernia, PUD,GERD  Medicated and Controlled,,(+)     (-) substance abuse    Endo/Other  neg diabetes    Renal/GU negative Renal ROS     Musculoskeletal  (+) Arthritis ,    Abdominal  (+) + obese  Peds  Hematology Denies blood thinner use or bleeding disorders.    Anesthesia Other Findings Denies blood thinner use or bleeding diatheses. Recent labs reviewed. Past Medical History: No date: Allergy No date: Anxiety No date: Arthritis No date: Bipolar disorder (HCC) No date: Bronchitis No date: Bronchitis No date: Crush injury lower leg     Comment:  Left lower leg No date: Depression No date: Gastric ulcer No date: GERD (gastroesophageal reflux disease)     Comment:  will awaken him in night No date: History of hiatal hernia No date: Hypertension No date: Migraine No date: Nerve pain No date: RSD (reflex sympathetic dystrophy)   Reproductive/Obstetrics                              Anesthesia  Physical Anesthesia Plan  ASA: 2  Anesthesia Plan: Spinal   Post-op Pain Management: Tylenol  PO (pre-op)*   Induction: Intravenous  PONV Risk Score and Plan: 0 and Ondansetron , Dexamethasone , Propofol  infusion, TIVA and Midazolam   Airway Management Planned: Natural Airway  Additional Equipment: None  Intra-op Plan:   Post-operative Plan:   Informed Consent: I have reviewed the patients History and Physical, chart, labs and discussed the procedure including the risks, benefits and alternatives for the proposed anesthesia with the patient or authorized representative who has indicated his/her understanding and acceptance.       Plan Discussed with: CRNA and Surgeon  Anesthesia Plan Comments: (Discussed R/B/A of neuraxial anesthesia technique with patient: - rare risks of spinal/epidural hematoma, nerve damage, infection - Risk of PDPH - Risk of nausea and vomiting - Risk of conversion to general anesthesia and its associated risks, including sore throat, damage to lips/eyes/teeth/oropharynx, and rare risks such as cardiac and respiratory events. - Risk of allergic reactions  Patient counseled on benefits of smoking cessation, and increased perioperative risks associated with continued smoking.   Discussed the role of CRNA in patient's perioperative care.  Patient voiced understanding.)        Anesthesia Quick Evaluation

## 2024-02-29 NOTE — Anesthesia Procedure Notes (Signed)
 Spinal  Patient location during procedure: OR Start time: 02/29/2024 10:08 AM End time: 02/29/2024 10:12 AM Reason for block: surgical anesthesia Staffing Performed: anesthesiologist  Anesthesiologist: Boone Fess, MD Performed by: Boone Fess, MD Authorized by: Boone Fess, MD   Preanesthetic Checklist Completed: patient identified, IV checked, site marked, risks and benefits discussed, surgical consent, monitors and equipment checked, pre-op evaluation and timeout performed Spinal Block Patient position: sitting Prep: ChloraPrep and site prepped and draped Patient monitoring: heart rate, continuous pulse ox, blood pressure and cardiac monitor Approach: midline Location: L3-4 Injection technique: single-shot Needle Needle type: Whitacre and Introducer  Needle gauge: 24 G Needle length: 9 cm Assessment Sensory level: T10 Events: CSF return Additional Notes Meticulous sterile technique used throughout (CHG prep, sterile gloves, sterile drape). Negative paresthesia. Negative blood return. Positive free-flowing CSF. Expiration date of kit checked and confirmed. Patient tolerated procedure well, without complications.

## 2024-02-29 NOTE — Evaluation (Signed)
 Physical Therapy Evaluation Patient Details Name: Shane D Pajak Jr. MRN: [id number] DOB: Feb 04, 1981 Today's Date: 02/29/2024  History of Present Illness  43 y.o. male presents to [facility] hospital on 02/29/2024 for elective L THA. PMH includes bipolar disorder, HTN, GERD, chronic back and shoulder pain, R THA.  Clinical Impression  Pt presents to PT with deficits in functional mobility, gait, balance, strength, ROM. Pt is able to ambulate for household distances with support of the RW. PT provides education on the THA exercise packet and encourages frequent mobilization with staff assistance. PT will follow up tomorrow for a progression of gait and to initiate stair training.        If plan is discharge home, recommend the following: A little help with bathing/dressing/bathroom;Assistance with cooking/housework;Assist for transportation;Help with stairs or ramp for entrance   Can travel by private vehicle        Equipment Recommendations None recommended by PT  Recommendations for Other Services       Functional Status Assessment Patient has had a recent decline in their functional status and demonstrates the ability to make significant improvements in function in a reasonable and predictable amount of time.     Precautions / Restrictions Precautions Precautions: Fall Recall of Precautions/Restrictions: Intact Precaution/Restrictions Comments: direct anterior THA, no hip precautions Restrictions Weight Bearing Restrictions Per Provider Order: Yes LLE Weight Bearing Per Provider Order: Weight bearing as tolerated      Mobility  Bed Mobility Overal bed mobility: Needs Assistance Bed Mobility: Supine to Sit, Sit to Supine     Supine to sit: Min assist Sit to supine: Contact guard assist        Transfers Overall transfer level: Needs assistance Equipment used: Rolling walker (2 wheels) Transfers: Sit to/from Stand Sit to Stand: Contact guard assist                 Ambulation/Gait Ambulation/Gait assistance: Supervision Gait Distance (Feet): 120 Feet Assistive device: Rolling walker (2 wheels) Gait Pattern/deviations: Step-through pattern Gait velocity: reduced Gait velocity interpretation: <1.8 ft/sec, indicate of risk for recurrent falls   General Gait Details: slowed step-through gait, reduced stance time on LLE  Stairs            Wheelchair Mobility     Tilt Bed    Modified Rankin (Stroke Patients Only)       Balance Overall balance assessment: Needs assistance Sitting-balance support: No upper extremity supported, Feet supported Sitting balance-Leahy Scale: Good     Standing balance support: Single extremity supported, Reliant on assistive device for balance Standing balance-Leahy Scale: Poor                               Pertinent Vitals/Pain Pain Assessment Pain Assessment: Faces Faces Pain Scale: Hurts even more Pain Location: L hip Pain Descriptors / Indicators: Sore Pain Intervention(s): Monitored during session    Home Living Family/patient expects to be discharged to:: Private residence Living Arrangements: Spouse/significant other Available Help at Discharge: Family;Available PRN/intermittently Type of Home: House Home Access: Stairs to enter Entrance Stairs-Rails: Right Entrance Stairs-Number of Steps: 3   Home Layout: One level Home Equipment: Agricultural Consultant (2 wheels);Toilet riser;Cane - single point      Prior Function Prior Level of Function : Independent/Modified Independent             Mobility Comments: ambulatory with SPC in the last few weeks due to pain at L hip  Extremity/Trunk Assessment   Upper Extremity Assessment Upper Extremity Assessment: Overall WFL for tasks assessed    Lower Extremity Assessment Lower Extremity Assessment: LLE deficits/detail LLE Deficits / Details: generalized post-op weakness as anticipated on POD 0    Cervical / Trunk  Assessment Cervical / Trunk Assessment: Normal  Communication   Communication Communication: No apparent difficulties    Cognition Arousal: Alert Behavior During Therapy: WFL for tasks assessed/performed   PT - Cognitive impairments: No apparent impairments                         Following commands: Intact       Cueing Cueing Techniques: Verbal cues     General Comments General comments (skin integrity, edema, etc.): VSS on RA    Exercises Other Exercises Other Exercises: PT provides education on the surgical hip exercise handout   Assessment/Plan    PT Assessment Patient needs continued PT services  PT Problem List Decreased strength;Decreased range of motion;Decreased activity tolerance;Decreased mobility;Decreased balance;Decreased knowledge of use of DME;Pain       PT Treatment Interventions DME instruction;Gait training;Stair training;Functional mobility training;Therapeutic activities;Therapeutic exercise;Balance training;Neuromuscular re-education;Patient/family education    PT Goals (Current goals can be found in the Care Plan section)  Acute Rehab PT Goals Patient Stated Goal: to return to independence PT Goal Formulation: With patient/family Time For Goal Achievement: 03/04/24 Potential to Achieve Goals: Good    Frequency 7X/week     Co-evaluation               AM-PAC PT 6 Clicks Mobility  Outcome Measure Help needed turning from your back to your side while in a flat bed without using bedrails?: A Little Help needed moving from lying on your back to sitting on the side of a flat bed without using bedrails?: A Little Help needed moving to and from a bed to a chair (including a wheelchair)?: A Little Help needed standing up from a chair using your arms (e.g., wheelchair or bedside chair)?: A Little Help needed to walk in hospital room?: A Little Help needed climbing 3-5 steps with a railing? : A Little 6 Click Score: 18    End of  Session Equipment Utilized During Treatment: Gait belt Activity Tolerance: Patient tolerated treatment well Patient left: in bed;with call bell/phone within reach;with family/visitor present Nurse Communication: Mobility status PT Visit Diagnosis: Other abnormalities of gait and mobility (R26.89);Muscle weakness (generalized) (M62.81);Pain Pain - Right/Left: Left Pain - part of body: Hip    Time: 8484-8456 PT Time Calculation (min) (ACUTE ONLY): 28 min   Charges:   PT Evaluation $PT Eval Low Complexity: 1 Low   PT General Charges $$ ACUTE PT VISIT: 1 Visit         Shane Cole, PT, DPT Acute Rehabilitation Office [phone]   Shane Cole 02/29/2024, 3:48 PM

## 2024-03-01 ENCOUNTER — Other Ambulatory Visit: Payer: Self-pay

## 2024-03-01 ENCOUNTER — Other Ambulatory Visit (HOSPITAL_COMMUNITY): Payer: Self-pay

## 2024-03-01 ENCOUNTER — Encounter (HOSPITAL_COMMUNITY): Payer: Self-pay | Admitting: Orthopaedic Surgery

## 2024-03-01 ENCOUNTER — Other Ambulatory Visit: Payer: Self-pay | Admitting: Physician Assistant

## 2024-03-01 MED ORDER — ENOXAPARIN SODIUM 40 MG/0.4ML IJ SOSY
40.0000 mg | PREFILLED_SYRINGE | INTRAMUSCULAR | 0 refills | Status: DC
  Filled 2024-03-01: qty 8.4, 21d supply, fill #0

## 2024-03-01 NOTE — Discharge Summary (Signed)
 Patient ID: Elbie D Sandiford Jr. MRN: [id number] DOB/AGE: 1981/01/11 43 y.o.  Admit date: 02/29/2024 Discharge date: 03/01/2024  Admission Diagnoses:  Principal Problem:   Primary osteoarthritis of left hip Active Problems:   Status post total replacement of left hip   Discharge Diagnoses:  Same  Past Medical History:  Diagnosis Date   Allergy    Anxiety    Arthritis    Bipolar disorder (HCC)    Bronchitis    Bronchitis    Crush injury lower leg    Left lower leg   Depression    Gastric ulcer    GERD (gastroesophageal reflux disease)    will awaken him in night   History of hiatal hernia    Hypertension    Migraine    Nerve pain    RSD (reflex sympathetic dystrophy)     Surgeries: Procedure(s): ARTHROPLASTY, HIP, TOTAL, ANTERIOR APPROACH on 02/29/2024   Consultants:   Discharged Condition: Improved  Hospital Course: Carzell Saldivar. is an 43 y.o. male who was admitted 02/29/2024 for operative treatment ofPrimary osteoarthritis of left hip. Patient has severe unremitting pain that affects sleep, daily activities, and work/hobbies. After pre-op clearance the patient was taken to the operating room on 02/29/2024 and underwent  Procedure(s): ARTHROPLASTY, HIP, TOTAL, ANTERIOR APPROACH.    Patient was given perioperative antibiotics:  Anti-infectives (From admission, onward)    Start     Dose/Rate Route Frequency Ordered Stop   02/29/24 1600  ceFAZolin  (ANCEF ) IVPB 2g/100 mL premix        2 g 200 mL/hr over 30 Minutes Intravenous Every 6 hours 02/29/24 1411 02/29/24 2229   02/29/24 1036  vancomycin  (VANCOCIN ) powder  Status:  Discontinued          As needed 02/29/24 1037 02/29/24 1159   02/29/24 0830  ceFAZolin  (ANCEF ) IVPB 2g/100 mL premix        2 g 200 mL/hr over 30 Minutes Intravenous On call to O.R. 02/29/24 0824 02/29/24 1050        Patient was given sequential compression devices, early ambulation, and chemoprophylaxis to prevent DVT.  Inpatient  Morphine  Milligram Equivalents Per Day 12/8 - 12/9   Values displayed are in units of MME/Day    Order Start / End Date Yesterday Today    oxyCODONE  (Oxy IR/ROXICODONE ) immediate release tablet 5 mg 12/8 - 12/8 0 of Unknown --    oxyCODONE  (ROXICODONE ) 5 MG/5ML solution 5 mg 12/8 - 12/8 0 of Unknown --      Group total: 0 of Unknown     HYDROcodone -acetaminophen  (NORCO/VICODIN) 5-325 MG per tablet 1-2 tablet 12/8 - No end date 0 of 10-20 0 of 15-30    HYDROcodone -acetaminophen  (NORCO) 7.5-325 MG per tablet 1-2 tablet 12/8 - 12/8 15 of 7.5-15 --    morphine  (PF) 2 MG/ML injection 0.5-1 mg 12/8 - No end date 0 of 3-6 0 of 6-12    fentaNYL  (SUBLIMAZE ) injection 25-50 mcg 12/8 - 12/8 7.5 of 45-90 --    fentaNYL  citrate (PF) (SUBLIMAZE ) injection 12/8 - 12/8 *15 of 15 --    oxyCODONE  (Oxy IR/ROXICODONE ) immediate release tablet 5-10 mg 12/8 - No end date 7.5 of 15-30 15 of 45-90    Daily Totals  * 45 of Unknown (at least 95.5-176) 15 of 66-132  *One-Step medication  Calculation Errors     Order Type Date Details   oxyCODONE  (Oxy IR/ROXICODONE ) immediate release tablet 5 mg Ordered Dose -- Insufficient frequency information   oxyCODONE  (  ROXICODONE ) 5 MG/5ML solution 5 mg Ordered Dose -- Insufficient frequency information            Patient benefited maximally from hospital stay and there were no complications.    Recent vital signs: Patient Vitals for the past 24 hrs:  BP Temp Temp src Pulse Resp SpO2  03/01/24 0729 120/69 99 F (37.2 C) Oral 97 16 97 %  03/01/24 0536 111/62 98.6 F (37 C) Oral 85 18 97 %  02/29/24 2312 (!) 93/49 98 F (36.7 C) Oral 77 20 96 %  02/29/24 1949 109/62 98.4 F (36.9 C) Oral 80 20 95 %  02/29/24 1613 127/75 98.2 F (36.8 C) Oral 90 19 95 %  02/29/24 1418 (!) 135/94 97.8 F (36.6 C) -- 76 20 100 %  02/29/24 1400 130/79 98.3 F (36.8 C) -- 81 13 98 %  02/29/24 1345 115/79 -- -- 84 19 99 %  02/29/24 1330 122/75 -- -- 74 19 98 %  02/29/24 1315 121/80  -- -- 67 12 100 %  02/29/24 1245 106/73 -- -- 63 10 98 %  02/29/24 1230 107/65 -- -- 73 15 97 %  02/29/24 1215 109/61 -- -- 75 13 96 %  02/29/24 1200 104/65 98.1 F (36.7 C) -- 76 10 96 %     Recent laboratory studies: No results for input(s): WBC, HGB, HCT, PLT, NA, K, CL, CO2, BUN, CREATININE, GLUCOSE, INR, CALCIUM in the last 72 hours.  Invalid input(s): PT, 2   Discharge Medications:   Allergies as of 03/01/2024       Reactions   Aspirin Shortness Of Breath, Tinitus   Tinnitus, dizzy, short of breath   Codeine Itching   Reaction to tylenol  #3   Penicillins Anaphylaxis, Other (See Comments)   Was told never to take medication.  Has patient had a PCN reaction causing immediate rash, facial/tongue/throat swelling, SOB or lightheadedness with hypotension: no Has patient had a PCN reaction causing severe rash involving mucus membranes or skin necrosis: no Has patient had a PCN reaction that required hospitalization: no Has patient had a PCN reaction occurring within the last 10 years: no If all of the above answers are NO, then may proceed with Cephalosporin use.   Tramadol     seizure   Amitriptyline  Rash        Medication List     STOP taking these medications    ibuprofen  800 MG tablet Commonly known as: ADVIL        TAKE these medications    albuterol  108 (90 Base) MCG/ACT inhaler Commonly known as: VENTOLIN  HFA Inhale 2 puffs into the lungs every 6 (six) hours as needed for wheezing or shortness of breath.   docusate sodium  100 MG capsule Commonly known as: Colace Take 1 capsule (100 mg total) by mouth daily as needed.   enoxaparin  40 MG/0.4ML injection Commonly known as: LOVENOX  Inject 0.4 mLs (40 mg total) into the skin daily for 21 days. To be taken instead of eliquis    famotidine  20 MG tablet Commonly known as: PEPCID  Take 20 mg by mouth in the morning, at noon, and at bedtime.   gabapentin  300 MG capsule Commonly  known as: NEURONTIN  Take 300-600 mg by mouth See admin instructions. Take 300 mg by mouth in the morning and 600 mg at night   lidocaine  5 % ointment Commonly known as: XYLOCAINE  Apply topically as needed for moderate pain.   lidocaine  5 % Commonly known as: LIDODERM  Place 2 patches onto the  skin daily. Remove & Discard patch within 12 hours or as directed by MD   methocarbamol  750 MG tablet Commonly known as: ROBAXIN  Take 1 tablet (750 mg total) by mouth 3 (three) times daily as needed.   multivitamin with minerals Tabs tablet Take 1 tablet by mouth daily.   nortriptyline  75 MG capsule Commonly known as: PAMELOR  Take 1 capsule (75 mg total) by mouth at bedtime.   ondansetron  4 MG tablet Commonly known as: Zofran  Take 1 tablet (4 mg total) by mouth every 8 (eight) hours as needed for nausea or vomiting.   oxyCODONE -acetaminophen  5-325 MG tablet Commonly known as: Percocet Take 1-2 tablets by mouth every 6 (six) hours as needed. To be taken after surgery   TENS Therapy Pain Relief Devi 1 application by Does not apply route daily as needed (chronic pain).               Durable Medical Equipment  (From admission, onward)           Start     Ordered   02/29/24 1412  DME Walker rolling  Once       Question:  Patient needs a walker to treat with the following condition  Answer:  History of hip replacement   02/29/24 1411   02/29/24 1412  DME 3 n 1  Once        02/29/24 1411   02/29/24 1412  DME Bedside commode  Once       Question:  Patient needs a bedside commode to treat with the following condition  Answer:  History of hip replacement   02/29/24 1411            Diagnostic Studies: DG Pelvis Portable Result Date: 02/29/2024 CLINICAL DATA:  Pain, status post arthroplasty. EXAM: PORTABLE PELVIS 1-2 VIEWS COMPARISON:  None Available. FINDINGS: Left hip arthroplasty in expected alignment. No periprosthetic lucency or fracture. Recent postsurgical change  includes air and edema in the soft tissues. Previous right hip arthroplasty. IMPRESSION: Left hip arthroplasty without immediate postoperative complication. Electronically Signed   By: Andrea Gasman M.D.   On: 02/29/2024 12:36   DG HIP UNILAT WITH PELVIS 1V LEFT Result Date: 02/29/2024 CLINICAL DATA:  Elective surgery. EXAM: DG HIP (WITH OR WITHOUT PELVIS) 1V*L* COMPARISON:  None Available. FINDINGS: Ten fluoroscopic spot views of the pelvis and left hip obtained in the operating room. Sequential images during hip arthroplasty. Fluoroscopy time 20 seconds. Dose 3.46 mGy. IMPRESSION: Intraoperative fluoroscopy during left hip arthroplasty. Electronically Signed   By: Andrea Gasman M.D.   On: 02/29/2024 12:15   DG C-Arm 1-60 Min-No Report Result Date: 02/29/2024 Fluoroscopy was utilized by the requesting physician.  No radiographic interpretation.    Disposition:      Contact information for follow-up providers     Jule Ronal CROME, PA-C. Schedule an appointment as soon as possible for a visit in 2 week(s).   Specialty: Orthopedic Surgery Contact information: [address] [place]  [place] [address] [phone]              Contact information for after-discharge care     Home Medical Care     [facility]) .   Service: Home Health Services Contact information: [address] [place]  [id number] [phone]                      Signed: Ronal CROME Jule 03/01/2024, 9:17 AM

## 2024-03-01 NOTE — TOC Transition Note (Signed)
 Transition of Care [facility]) - Discharge Note   Patient Details  Name: Shane D Hipps Jr. MRN: [id number] Date of Birth: [birth year]  [facility]) CM/SW Contact:  Andrez JULIANNA George, RN Phone Number: 03/01/2024, 8:30 AM   Clinical Narrative:     Pt will discharge home with home health services through [place]. Information on the AVS. [facility] will contact him for the first home visit. Pt has transportation home.  Final next level of care: Home w Home Health Services Barriers to Discharge: No Barriers Identified   Patient Goals and CMS Choice            Discharge Placement                       Discharge Plan and Services Additional resources added to the After Visit Summary for                            [facility] Arranged: PT HH Agency: [facility] Date [facility] Agency Contacted: 03/01/24   Representative spoke with at [facility] Agency: cory  Social Drivers of Health (SDOH) Interventions SDOH Screenings   Food Insecurity: Food Insecurity Present (01/26/2024)  Housing: Low Risk  (01/26/2024)  Transportation Needs: No Transportation Needs (01/26/2024)  Alcohol  Screen: Low Risk  (01/26/2024)  Depression (PHQ2-9): Medium Risk (11/14/2021)  Financial Resource Strain: Medium Risk (01/26/2024)  Physical Activity: Insufficiently Active (01/26/2024)  Social Connections: Socially Integrated (01/26/2024)  Stress: No Stress Concern Present (01/26/2024)  Tobacco Use: High Risk (02/24/2024)     Readmission Risk Interventions     No data to display

## 2024-03-01 NOTE — Progress Notes (Signed)
 Patient awaiting family for discharge home, Patient in no acute distress nor complaints of pain nor discomfort; incision on hip is clean, dry and intact; c/o pain at this time. Room was checked and accounted for all patient's belongings; discharge instructions concerning her medications, incision care, follow up appointment and when to call the doctor as needed were all discussed with patient and wife by RN and they expressed understanding on the instructions given.

## 2024-03-01 NOTE — Plan of Care (Signed)
   Problem: Education: Goal: Knowledge of General Education information will improve Description: Including pain rating scale, medication(s)/side effects and non-pharmacologic comfort measures Outcome: Completed/Met   Problem: Health Behavior/Discharge Planning: Goal: Ability to manage health-related needs will improve Outcome: Completed/Met   Problem: Clinical Measurements: Goal: Ability to maintain clinical measurements within normal limits will improve Outcome: Completed/Met Goal: Will remain free from infection Outcome: Completed/Met Goal: Diagnostic test results will improve Outcome: Completed/Met Goal: Respiratory complications will improve Outcome: Completed/Met Goal: Cardiovascular complication will be avoided Outcome: Completed/Met   Problem: Activity: Goal: Risk for activity intolerance will decrease Outcome: Completed/Met   Problem: Nutrition: Goal: Adequate nutrition will be maintained Outcome: Completed/Met   Problem: Coping: Goal: Level of anxiety will decrease Outcome: Completed/Met   Problem: Elimination: Goal: Will not experience complications related to bowel motility Outcome: Completed/Met Goal: Will not experience complications related to urinary retention Outcome: Completed/Met   Problem: Pain Managment: Goal: General experience of comfort will improve and/or be controlled Outcome: Completed/Met   Problem: Safety: Goal: Ability to remain free from injury will improve Outcome: Completed/Met   Problem: Skin Integrity: Goal: Risk for impaired skin integrity will decrease Outcome: Completed/Met   Problem: Education: Goal: Knowledge of the prescribed therapeutic regimen will improve Outcome: Completed/Met Goal: Understanding of discharge needs will improve Outcome: Completed/Met Goal: Individualized Educational Video(s) Outcome: Completed/Met   Problem: Activity: Goal: Ability to avoid complications of mobility impairment will  improve Outcome: Completed/Met Goal: Ability to tolerate increased activity will improve Outcome: Completed/Met   Problem: Clinical Measurements: Goal: Postoperative complications will be avoided or minimized Outcome: Completed/Met   Problem: Pain Management: Goal: Pain level will decrease with appropriate interventions Outcome: Completed/Met   Problem: Skin Integrity: Goal: Will show signs of wound healing Outcome: Completed/Met

## 2024-03-01 NOTE — Progress Notes (Signed)
 Subjective: 1 Day Post-Op Procedure(s) (LRB): ARTHROPLASTY, HIP, TOTAL, ANTERIOR APPROACH (Left) Patient reports pain as mild.    Objective: Vital signs in last 24 hours: Temp:  [97.8 F (36.6 C)-99 F (37.2 C)] 99 F (37.2 C) (12/09 0729) Pulse Rate:  [63-97] 97 (12/09 0729) Resp:  [10-20] 16 (12/09 0729) BP: (93-135)/(49-94) 120/69 (12/09 0729) SpO2:  [95 %-100 %] 97 % (12/09 0729)  Intake/Output from previous day: 12/08 0701 - 12/09 0700 In: 2340 [P.O.:1440; I.V.:700; IV Piggyback:200] Out: 1200 [Urine:1100; Blood:100] Intake/Output this shift: No intake/output data recorded.  No results for input(s): HGB in the last 72 hours. No results for input(s): WBC, RBC, HCT, PLT in the last 72 hours. No results for input(s): NA, K, CL, CO2, BUN, CREATININE, GLUCOSE, CALCIUM in the last 72 hours. No results for input(s): LABPT, INR in the last 72 hours.  Neurologically intact Neurovascular intact Sensation intact distally Intact pulses distally Dorsiflexion/Plantar flexion intact Incision: dressing C/D/I No cellulitis present Compartment soft   Assessment/Plan: 1 Day Post-Op Procedure(s) (LRB): ARTHROPLASTY, HIP, TOTAL, ANTERIOR APPROACH (Left) Advance diet Up with therapy D/C IV fluids Discharge home with home health once cleared by PT WBAT LLE Dvt ppx- patient allergic to ASA.  Eliquis  coupon only good for once in a lifetime and xarelto  coupon will not work for post-op dvt ppx.  Discussed Lovenox  with patient and sent in to [facility].  If this is less expensive than eliquis , he will take this instead.  He knows and understands not to take both.  This was also discussed with wife over the phone      Ronal LITTIE Grave 03/01/2024, 9:14 AM

## 2024-03-01 NOTE — Progress Notes (Signed)
 Physical Therapy Treatment Patient Details Name: Shane D Collar Jr. MRN: [id number] DOB: 01/26/1981 Today's Date: 03/01/2024   History of Present Illness 43 y.o. male presents to [facility] hospital on 02/29/2024 for elective L THA. PMH includes bipolar disorder, HTN, GERD, chronic back and shoulder pain, R THA.    PT Comments  Patient progressing and stable for d/c home after stair practice, review of HEP frequency and car transfers.  Patient able to transfer and ambulate on his own.  Reports pain improved and leg moving better than when he had his other hip done.  Plans for home later today.  RN aware.     If plan is discharge home, recommend the following: A little help with bathing/dressing/bathroom;Assistance with cooking/housework;Assist for transportation;Help with stairs or ramp for entrance   Can travel by private vehicle        Equipment Recommendations  None recommended by PT    Recommendations for Other Services       Precautions / Restrictions Precautions Precautions: Fall Recall of Precautions/Restrictions: Intact Precaution/Restrictions Comments: direct anterior THA, no hip precautions Restrictions LLE Weight Bearing Per Provider Order: Weight bearing as tolerated     Mobility  Bed Mobility Overal bed mobility: Modified Independent                  Transfers Overall transfer level: Modified independent Equipment used: Rolling walker (2 wheels)                    Ambulation/Gait Ambulation/Gait assistance: Modified independent (Device/Increase time) Gait Distance (Feet): 300 Feet Assistive device: Rolling walker (2 wheels) Gait Pattern/deviations: Step-to pattern, Step-through pattern, Decreased stride length, Antalgic       General Gait Details: reports burning on anteriolateral aspect of hip   Stairs Stairs: Yes Stairs assistance: Contact guard assist Stair Management: One rail Right, Step to pattern, Forwards (with HHA) Number of Stairs:  3 General stair comments: education on technique and A for balance   Wheelchair Mobility     Tilt Bed    Modified Rankin (Stroke Patients Only)       Balance Overall balance assessment: Needs assistance   Sitting balance-Leahy Scale: Good     Standing balance support: No upper extremity supported Standing balance-Leahy Scale: Fair                              Hotel Manager: No apparent difficulties  Cognition Arousal: Alert Behavior During Therapy: WFL for tasks assessed/performed   PT - Cognitive impairments: No apparent impairments                         Following commands: Intact      Cueing Cueing Techniques: Verbal cues  Exercises      General Comments General comments (skin integrity, edema, etc.): reviewed car transfer technique, ice, exercise plan, etc      Pertinent Vitals/Pain Pain Assessment Pain Assessment: Faces Faces Pain Scale: Hurts little more Pain Location: L hip Pain Descriptors / Indicators: Burning Pain Intervention(s): Monitored during session, Ice applied, Repositioned    Home Living                          Prior Function            PT Goals (current goals can now be found in the care plan section) Progress towards PT goals: Progressing  toward goals    Frequency    7X/week      PT Plan      Co-evaluation              AM-PAC PT 6 Clicks Mobility   Outcome Measure  Help needed turning from your back to your side while in a flat bed without using bedrails?: None Help needed moving from lying on your back to sitting on the side of a flat bed without using bedrails?: None Help needed moving to and from a bed to a chair (including a wheelchair)?: A Little Help needed standing up from a chair using your arms (e.g., wheelchair or bedside chair)?: None Help needed to walk in hospital room?: None Help needed climbing 3-5 steps with a railing? : A  Little 6 Click Score: 22    End of Session Equipment Utilized During Treatment: Gait belt Activity Tolerance: Patient tolerated treatment well Patient left: in bed;with call bell/phone within reach   PT Visit Diagnosis: Other abnormalities of gait and mobility (R26.89);Muscle weakness (generalized) (M62.81);Pain Pain - Right/Left: Left Pain - part of body: Hip     Time: 1005-1030 PT Time Calculation (min) (ACUTE ONLY): 25 min  Charges:    $Gait Training: 8-22 mins $Therapeutic Exercise: 8-22 mins PT General Charges $$ ACUTE PT VISIT: 1 Visit                     Micheline Portal, PT Acute Rehabilitation Services Office:[phone] 03/01/2024    [name] 03/01/2024, 11:21 AM

## 2024-03-07 ENCOUNTER — Other Ambulatory Visit (HOSPITAL_COMMUNITY): Payer: Self-pay

## 2024-03-07 ENCOUNTER — Other Ambulatory Visit: Payer: Self-pay

## 2024-03-07 ENCOUNTER — Telehealth: Payer: Self-pay | Admitting: Physician Assistant

## 2024-03-07 MED ORDER — OXYCODONE-ACETAMINOPHEN 5-325 MG PO TABS
1.0000 | ORAL_TABLET | Freq: Two times a day (BID) | ORAL | 0 refills | Status: DC | PRN
  Filled 2024-03-07 (×3): qty 40, 10d supply, fill #0

## 2024-03-07 NOTE — Telephone Encounter (Signed)
 done

## 2024-03-07 NOTE — Telephone Encounter (Signed)
 Pt called requesting a refill of oxycodone . Please send to [facility]. Pt number is [phone].

## 2024-03-08 ENCOUNTER — Telehealth: Payer: Self-pay | Admitting: Orthopaedic Surgery

## 2024-03-08 NOTE — Telephone Encounter (Signed)
 Amy (PT) from [facility] called requesting verbal orders for 2wk 3 and 1 wk 3. Also states pt doesn't have albuterol  on list of meds

## 2024-03-11 ENCOUNTER — Other Ambulatory Visit (HOSPITAL_COMMUNITY): Payer: Self-pay

## 2024-03-11 ENCOUNTER — Other Ambulatory Visit: Payer: Self-pay

## 2024-03-15 ENCOUNTER — Ambulatory Visit: Admitting: Orthopaedic Surgery

## 2024-03-15 ENCOUNTER — Other Ambulatory Visit (HOSPITAL_COMMUNITY): Payer: Self-pay

## 2024-03-15 ENCOUNTER — Encounter: Payer: Self-pay | Admitting: Orthopaedic Surgery

## 2024-03-15 DIAGNOSIS — Z96642 Presence of left artificial hip joint: Secondary | ICD-10-CM

## 2024-03-15 MED ORDER — HYDROCODONE-ACETAMINOPHEN 5-325 MG PO TABS
1.0000 | ORAL_TABLET | Freq: Every day | ORAL | 0 refills | Status: DC | PRN
  Filled 2024-03-15 (×2): qty 10, 5d supply, fill #0

## 2024-03-15 NOTE — Progress Notes (Signed)
 "  Post-Op Visit Note   Patient: Shane D Basher Jr.           Date of Birth: 1980-03-31           MRN: [id number] Visit Date: 03/15/2024 PCP: Merna Huxley, NP   Assessment & Plan:  Chief Complaint:  Chief Complaint  Patient presents with   Left Hip - Follow-up    Left THA 02/29/2024   Visit Diagnoses:  1. Status post total replacement of left hip     Plan: History of Present Illness Shane D Broerman Jr. is a 43 year old male who presents for postoperative follow-up after left hip arthroplasty, with ongoing pain and functional limitations.  Since left hip arthroplasty he has had significant pain in the left groin, knee, and femur, with severe femoral pain that worsens with activity. Swelling at the operative site is improving but pain persists.  He completed one formal physical therapy session, which caused intense pain, and was advised to delay further sessions until swelling decreased. He performs daily home exercises and ambulates at home. He can take a few steps without a walker but needs it for longer distances and transfers due to pain and a sense of instability.  He feels the left leg is longer than the right, which affects gait and comfort, though he is unsure if there is a true discrepancy. A nurse previously noted this concern.  He describes alternating hot and cold sensations with sweating and feeling very cold and asks if this is normal after surgery. He recently was able to shower for the first time since surgery, reflecting some improved mobility.  Physical Exam Left hip surgical incision is healed.  No signs of infection or drainage.  Expected postoperative appearance of the extremity.  Assessment and Plan Postoperative care following left hip replacement Early postoperative period with significant pain attributed to normal changes. Subjective leg length discrepancy expected to resolve. Incision healing well. - Discontinued all physical therapy exercises. -  Recommended ambulation as sole rehabilitation. - Advised avoidance of lateral raises and strengthening exercises. - Reassured leg length sensation will resolve. - Provided education on normal postoperative responses. - Advised continued walker use for ambulation and safety.  May wean walker as his gait improves.  Follow-Up Instructions: Return in about 4 weeks (around 04/12/2024) for with lindsey.   Orders:  No orders of the defined types were placed in this encounter.  No orders of the defined types were placed in this encounter.   Imaging: No results found.  PMFS History: Patient Active Problem List   Diagnosis Date Noted   Status post total replacement of left hip 02/29/2024   Primary osteoarthritis of left hip 01/19/2024   Raynaud phenomenon 05/08/2021   Family history of avascular necrosis of hip (Right) 05/08/2021   History of total hip replacement (Right) 12/03/2020   Pharmacologic therapy 07/30/2020   Disorder of skeletal system 07/30/2020   Problems influencing health status 07/30/2020   Chronic low back pain (2ry area of Pain) (Bilateral) w/o sciatica 07/30/2020   Chronic shoulder pain (3ry area of Pain) (Right) 07/30/2020   Auricular cyst 01/21/2018   Laryngopharyngeal reflux (LPR) 01/21/2018   GERD (gastroesophageal reflux disease) 07/16/2016   Internal hemorrhoid 07/16/2016   Abdominal pain, chronic, epigastric 07/16/2016   Rectal bleeding 07/16/2016   Essential hypertension 02/26/2016   Adhesive capsulitis of right shoulder 07/16/2015   Thoracic outlet syndrome 02/12/2015   Chronic narcotic use 10/26/2014   Opioid contract exists 10/26/2014   Smoker  05/11/2014   Coccygeal pain, acute 05/18/2012   Chronic pain syndrome 05/18/2012   Chronic diarrhea 03/29/2012   Bipolar disorder (HCC) 03/29/2012   Chronic foot pain (1ry area of Pain) (Left) 06/11/2011   Past Medical History:  Diagnosis Date   Allergy    Anxiety    Arthritis    Bipolar disorder (HCC)     Bronchitis    Bronchitis    Crush injury lower leg    Left lower leg   Depression    Gastric ulcer    GERD (gastroesophageal reflux disease)    will awaken him in night   History of hiatal hernia    Hypertension    Migraine    Nerve pain    RSD (reflex sympathetic dystrophy)     Family History  Problem Relation Age of Onset   Hyperlipidemia Father    Hypertension Father    Anxiety disorder Father    Drug abuse Father    Cancer Paternal Grandfather        lung, colon   Colon cancer Paternal Grandfather    Lung cancer Paternal Grandfather    Diabetes Paternal Grandmother    Cancer Paternal Grandmother    Cancer Maternal Grandmother    Cancer Maternal Grandfather    Lung cancer Maternal Grandfather    Stroke Other    Heart disease Other    Depression Paternal Aunt    Anxiety disorder Paternal Aunt    Drug abuse Paternal Uncle    Colon cancer Paternal Uncle    Esophageal cancer Neg Hx    Stomach cancer Neg Hx    Rectal cancer Neg Hx     Past Surgical History:  Procedure Laterality Date   CHOLECYSTECTOMY N/A 01/24/2014   Procedure: LAPAROSCOPIC CHOLECYSTECTOMY;  Surgeon: Lynda Leos, MD;  Location: [facility];  Service: General;  Laterality: N/A;   COLONOSCOPY     HAND SURGERY     MASS EXCISION Left 01/25/2013   Procedure: EXCISION MASS DORSAL ASPECT LEFT LONG FINGER DISTAL INTERPHALANGEAL JOINT;  Surgeon: Lamar LULLA Leonor Mickey., MD;  Location: [facility];  Service: Orthopedics;  Laterality: Left;  Left long    MOUTH SURGERY     TOTAL HIP ARTHROPLASTY Right 12/03/2020   Procedure: TOTAL HIP ARTHROPLASTY ANTERIOR APPROACH;  Surgeon: Jerri Kay HERO, MD;  Location: [facility] OR;  Service: Orthopedics;  Laterality: Right;  3-C   TOTAL HIP ARTHROPLASTY Left 02/29/2024   Procedure: ARTHROPLASTY, HIP, TOTAL, ANTERIOR APPROACH;  Surgeon: Jerri Kay HERO, MD;  Location: [facility];  Service: Orthopedics;  Laterality: Left;  3-C   Social History   Occupational History   Occupation:  Disabled    Comment: Previously worked web designer  Tobacco Use   Smoking status: Every Day    Current packs/day: 0.25    Average packs/day: 0.3 packs/day for 23.0 years (5.8 ttl pk-yrs)    Types: Cigarettes   Smokeless tobacco: Never   Tobacco comments:    Using nicotine  patch  Vaping Use   Vaping status: Some Days   Substances: Nicotine   Substance and Sexual Activity   Alcohol  use: Yes    Alcohol /week: 6.0 standard drinks of alcohol     Types: 6 Cans of beer per week    Comment: occasional   Drug use: No   Sexual activity: Yes    Partners: Female     "

## 2024-03-22 ENCOUNTER — Telehealth: Payer: Self-pay | Admitting: Orthopaedic Surgery

## 2024-03-22 NOTE — Telephone Encounter (Signed)
 Called therapist back. OK to discharge from HHPT. Tried to call patient to check in and see if he was interested in outpatient PT. No answer. Voicemail is full.

## 2024-03-22 NOTE — Telephone Encounter (Signed)
 Sounds like initial PT visit flared his hip.  Unless he feels he needs PT, we do not need to send to [facility]

## 2024-03-22 NOTE — Telephone Encounter (Signed)
 Amy from [facility] called saying that the pt isn't returning their phone calls or answering his phone. They saw him for his initial visit, but haven't seen him since and is thinking that out pt therapy would be best for him. Call back number is [phone].

## 2024-03-25 ENCOUNTER — Other Ambulatory Visit: Payer: Self-pay

## 2024-03-25 ENCOUNTER — Other Ambulatory Visit (HOSPITAL_COMMUNITY): Payer: Self-pay

## 2024-03-25 ENCOUNTER — Other Ambulatory Visit: Payer: Self-pay | Admitting: Physician Assistant

## 2024-03-25 ENCOUNTER — Telehealth: Payer: Self-pay | Admitting: Orthopaedic Surgery

## 2024-03-25 MED ORDER — HYDROCODONE-ACETAMINOPHEN 5-325 MG PO TABS
1.0000 | ORAL_TABLET | Freq: Every day | ORAL | 0 refills | Status: AC | PRN
  Filled 2024-03-25: qty 10, 5d supply, fill #0

## 2024-03-25 NOTE — Telephone Encounter (Signed)
 sent

## 2024-03-25 NOTE — Telephone Encounter (Signed)
 Rx refill Hydrocodone     [facility]

## 2024-04-08 ENCOUNTER — Other Ambulatory Visit (HOSPITAL_COMMUNITY): Payer: Self-pay

## 2024-04-08 ENCOUNTER — Other Ambulatory Visit: Payer: Self-pay

## 2024-04-08 MED ORDER — NICOTINE 21 MG/24HR TD PT24
21.0000 mg | MEDICATED_PATCH | Freq: Every day | TRANSDERMAL | 0 refills | Status: AC
  Filled 2024-04-08: qty 14, 14d supply, fill #0

## 2024-04-12 ENCOUNTER — Other Ambulatory Visit: Payer: Self-pay

## 2024-04-12 ENCOUNTER — Ambulatory Visit: Admitting: Physician Assistant

## 2024-04-12 ENCOUNTER — Other Ambulatory Visit (INDEPENDENT_AMBULATORY_CARE_PROVIDER_SITE_OTHER): Payer: Self-pay

## 2024-04-12 DIAGNOSIS — Z96642 Presence of left artificial hip joint: Secondary | ICD-10-CM

## 2024-04-12 DIAGNOSIS — M25562 Pain in left knee: Secondary | ICD-10-CM | POA: Diagnosis not present

## 2024-04-12 DIAGNOSIS — G8929 Other chronic pain: Secondary | ICD-10-CM

## 2024-04-12 MED ORDER — PREDNISONE 10 MG (21) PO TBPK
ORAL_TABLET | ORAL | 0 refills | Status: AC
  Filled 2024-04-12: qty 21, 6d supply, fill #0

## 2024-04-12 NOTE — Progress Notes (Signed)
 "  Post-Op Visit Note   Patient: Shane Cole.           Date of Birth: 06-07-1980           MRN: [id number] Visit Date: 04/12/2024 PCP: Merna Huxley, NP   Assessment & Plan:  Chief Complaint:  Chief Complaint  Patient presents with   Left Hip - Follow-up    Left THA 02/29/2024   Visit Diagnoses:  1. Status post total replacement of left hip   2. Chronic pain of left knee     Plan: Patient is a very pleasant 44 year old gentleman who comes in today approximately 6 weeks status post left total hip replacement.  He has been doing well in regards to his hip.  He has been ambulating with a single-point cane and is only taking over-the-counter pain medication.  He does however take occasional muscle relaxer.  He has finished his aspirin for which she was taking for DVT prophylaxis.  The main issue he has been having is left knee pain with occasional swelling.  This began soon after surgery.  He has pain to the distal quad as well as the lateral knee.  Pain is worse when he is standing.  Examination of the left hip reveals well-healing surgical incision.  He has a small scab to the most distal aspect.  No signs of infection or cellulitis.  Painless hip flexion and logroll.  Left knee exam: No effusion.  Range of motion 0 to 100 degrees.  He does have medial joint line tenderness.  He has neurovascular intact distally.  In regards to the left hip, he may discontinue the aspirin from a DVT prophylaxis standpoint.  Continue to advance with activity as tolerated.  In regards to the left knee, we have discussed this could be referred from his hip although he does have point tenderness on exam.  I have offered him a diagnostic/therapeutic cortisone injection but he has politely declined due to a needle phobia.  He would like to try a round of steroids.  Have agreed to send this in.  Follow-up in 6 weeks for recheck.  Call with concerns or questions.  Follow-Up Instructions: Return in about 6 weeks  (around 05/24/2024).   Orders:  Orders Placed This Encounter  Procedures   XR Pelvis 1-2 Views   XR KNEE 3 VIEW LEFT   Meds ordered this encounter  Medications   predniSONE  (STERAPRED UNI-PAK 21 TAB) 10 MG (21) TBPK tablet    Sig: Take as directed    Dispense:  21 tablet    Refill:  0    Imaging: XR Pelvis 1-2 Views Result Date: 04/12/2024 Well-seated prosthesis without complication  XR KNEE 3 VIEW LEFT Result Date: 04/12/2024 Minimal degenerative changes to the medial and patellofemoral compartments   PMFS History: Patient Active Problem List   Diagnosis Date Noted   Status post total replacement of left hip 02/29/2024   Primary osteoarthritis of left hip 01/19/2024   Raynaud phenomenon 05/08/2021   Family history of avascular necrosis of hip (Right) 05/08/2021   History of total hip replacement (Right) 12/03/2020   Pharmacologic therapy 07/30/2020   Disorder of skeletal system 07/30/2020   Problems influencing health status 07/30/2020   Chronic low back pain (2ry area of Pain) (Bilateral) w/o sciatica 07/30/2020   Chronic shoulder pain (3ry area of Pain) (Right) 07/30/2020   Auricular cyst 01/21/2018   Laryngopharyngeal reflux (LPR) 01/21/2018   GERD (gastroesophageal reflux disease) 07/16/2016   Internal hemorrhoid  07/16/2016   Abdominal pain, chronic, epigastric 07/16/2016   Rectal bleeding 07/16/2016   Essential hypertension 02/26/2016   Adhesive capsulitis of right shoulder 07/16/2015   Thoracic outlet syndrome 02/12/2015   Chronic narcotic use 10/26/2014   Opioid contract exists 10/26/2014   Smoker 05/11/2014   Coccygeal pain, acute 05/18/2012   Chronic pain syndrome 05/18/2012   Chronic diarrhea 03/29/2012   Bipolar disorder (HCC) 03/29/2012   Chronic foot pain (1ry area of Pain) (Left) 06/11/2011   Past Medical History:  Diagnosis Date   Allergy    Anxiety    Arthritis    Bipolar disorder (HCC)    Bronchitis    Bronchitis    Crush injury lower leg     Left lower leg   Depression    Gastric ulcer    GERD (gastroesophageal reflux disease)    will awaken him in night   History of hiatal hernia    Hypertension    Migraine    Nerve pain    RSD (reflex sympathetic dystrophy)     Family History  Problem Relation Age of Onset   Hyperlipidemia Father    Hypertension Father    Anxiety disorder Father    Drug abuse Father    Cancer Paternal Grandfather        lung, colon   Colon cancer Paternal Grandfather    Lung cancer Paternal Grandfather    Diabetes Paternal Grandmother    Cancer Paternal Grandmother    Cancer Maternal Grandmother    Cancer Maternal Grandfather    Lung cancer Maternal Grandfather    Stroke Other    Heart disease Other    Depression Paternal Aunt    Anxiety disorder Paternal Aunt    Drug abuse Paternal Uncle    Colon cancer Paternal Uncle    Esophageal cancer Neg Hx    Stomach cancer Neg Hx    Rectal cancer Neg Hx     Past Surgical History:  Procedure Laterality Date   CHOLECYSTECTOMY N/A 01/24/2014   Procedure: LAPAROSCOPIC CHOLECYSTECTOMY;  Surgeon: Lynda Leos, MD;  Location: [facility];  Service: General;  Laterality: N/A;   COLONOSCOPY     HAND SURGERY     MASS EXCISION Left 01/25/2013   Procedure: EXCISION MASS DORSAL ASPECT LEFT LONG FINGER DISTAL INTERPHALANGEAL JOINT;  Surgeon: Lamar LULLA Leonor Mickey., MD;  Location: [facility];  Service: Orthopedics;  Laterality: Left;  Left long    MOUTH SURGERY     TOTAL HIP ARTHROPLASTY Right 12/03/2020   Procedure: TOTAL HIP ARTHROPLASTY ANTERIOR APPROACH;  Surgeon: Jerri Kay HERO, MD;  Location: [facility];  Service: Orthopedics;  Laterality: Right;  3-C   TOTAL HIP ARTHROPLASTY Left 02/29/2024   Procedure: ARTHROPLASTY, HIP, TOTAL, ANTERIOR APPROACH;  Surgeon: Jerri Kay HERO, MD;  Location: [facility];  Service: Orthopedics;  Laterality: Left;  3-C   Social History   Occupational History   Occupation: Disabled    Comment: Previously worked garment/textile technologist  Tobacco Use   Smoking status: Every Day    Current packs/day: 0.25    Average packs/day: 0.3 packs/day for 23.0 years (5.8 ttl pk-yrs)    Types: Cigarettes   Smokeless tobacco: Never   Tobacco comments:    Using nicotine  patch  Vaping Use   Vaping status: Some Days   Substances: Nicotine   Substance and Sexual Activity   Alcohol  use: Yes    Alcohol /week: 6.0 standard drinks of alcohol     Types: 6 Cans of  beer per week    Comment: occasional   Drug use: No   Sexual activity: Yes    Partners: Female     "

## 2024-05-24 ENCOUNTER — Encounter: Admitting: Orthopaedic Surgery
